# Patient Record
Sex: Male | Born: 1950 | Race: White | Hispanic: No | Marital: Married | Smoking: Never smoker
Health system: Southern US, Community
[De-identification: ages and names within clinical notes are randomized; demographics above are authoritative.]

## PROBLEM LIST (undated history)

## (undated) DIAGNOSIS — G2111 Neuroleptic induced parkinsonism: Secondary | ICD-10-CM

## (undated) DIAGNOSIS — I219 Acute myocardial infarction, unspecified: Secondary | ICD-10-CM

## (undated) DIAGNOSIS — E785 Hyperlipidemia, unspecified: Secondary | ICD-10-CM

## (undated) DIAGNOSIS — K648 Other hemorrhoids: Secondary | ICD-10-CM

## (undated) DIAGNOSIS — F419 Anxiety disorder, unspecified: Secondary | ICD-10-CM

## (undated) DIAGNOSIS — G478 Other sleep disorders: Secondary | ICD-10-CM

## (undated) DIAGNOSIS — K589 Irritable bowel syndrome without diarrhea: Secondary | ICD-10-CM

## (undated) DIAGNOSIS — I10 Essential (primary) hypertension: Secondary | ICD-10-CM

## (undated) DIAGNOSIS — C649 Malignant neoplasm of unspecified kidney, except renal pelvis: Secondary | ICD-10-CM

## (undated) DIAGNOSIS — I2510 Atherosclerotic heart disease of native coronary artery without angina pectoris: Secondary | ICD-10-CM

## (undated) DIAGNOSIS — Z973 Presence of spectacles and contact lenses: Secondary | ICD-10-CM

## (undated) DIAGNOSIS — G4750 Parasomnia, unspecified: Secondary | ICD-10-CM

## (undated) DIAGNOSIS — M1990 Unspecified osteoarthritis, unspecified site: Secondary | ICD-10-CM

## (undated) DIAGNOSIS — F32A Depression, unspecified: Secondary | ICD-10-CM

## (undated) DIAGNOSIS — M25511 Pain in right shoulder: Secondary | ICD-10-CM

## (undated) DIAGNOSIS — J45909 Unspecified asthma, uncomplicated: Secondary | ICD-10-CM

## (undated) DIAGNOSIS — F329 Major depressive disorder, single episode, unspecified: Secondary | ICD-10-CM

## (undated) DIAGNOSIS — M4322 Fusion of spine, cervical region: Secondary | ICD-10-CM

## (undated) DIAGNOSIS — G4700 Insomnia, unspecified: Secondary | ICD-10-CM

## (undated) DIAGNOSIS — R0683 Snoring: Secondary | ICD-10-CM

## (undated) DIAGNOSIS — Z955 Presence of coronary angioplasty implant and graft: Secondary | ICD-10-CM

## (undated) DIAGNOSIS — Z8719 Personal history of other diseases of the digestive system: Secondary | ICD-10-CM

## (undated) DIAGNOSIS — Z8781 Personal history of (healed) traumatic fracture: Secondary | ICD-10-CM

## (undated) DIAGNOSIS — T43505A Adverse effect of unspecified antipsychotics and neuroleptics, initial encounter: Secondary | ICD-10-CM

## (undated) DIAGNOSIS — G629 Polyneuropathy, unspecified: Secondary | ICD-10-CM

## (undated) DIAGNOSIS — G219 Secondary parkinsonism, unspecified: Secondary | ICD-10-CM

## (undated) DIAGNOSIS — G4730 Sleep apnea, unspecified: Secondary | ICD-10-CM

## (undated) DIAGNOSIS — R5383 Other fatigue: Secondary | ICD-10-CM

## (undated) DIAGNOSIS — E039 Hypothyroidism, unspecified: Secondary | ICD-10-CM

## (undated) DIAGNOSIS — K219 Gastro-esophageal reflux disease without esophagitis: Secondary | ICD-10-CM

## (undated) DIAGNOSIS — K5790 Diverticulosis of intestine, part unspecified, without perforation or abscess without bleeding: Secondary | ICD-10-CM

## (undated) HISTORY — DX: Parasomnia, unspecified: G47.50

## (undated) HISTORY — DX: Other sleep disorders: G47.8

## (undated) HISTORY — DX: Polyneuropathy, unspecified: G62.9

## (undated) HISTORY — DX: Presence of coronary angioplasty implant and graft: Z95.5

## (undated) HISTORY — DX: Hyperlipidemia, unspecified: E78.5

## (undated) HISTORY — DX: Malignant neoplasm of unspecified kidney, except renal pelvis: C64.9

## (undated) HISTORY — DX: Fusion of spine, cervical region: M43.22

## (undated) HISTORY — DX: Atherosclerotic heart disease of native coronary artery without angina pectoris: I25.10

## (undated) HISTORY — DX: Personal history of (healed) traumatic fracture: Z87.81

## (undated) HISTORY — DX: Anxiety disorder, unspecified: F41.9

## (undated) HISTORY — PX: CORONARY STENT PLACEMENT: SHX1402

## (undated) HISTORY — PX: HERNIA REPAIR: SHX51

## (undated) HISTORY — PX: COLONOSCOPY: SHX174

## (undated) HISTORY — PX: COLONOSCOPY W/ BIOPSIES AND POLYPECTOMY: SHX1376

## (undated) HISTORY — DX: Unspecified asthma, uncomplicated: J45.909

## (undated) HISTORY — DX: Unspecified osteoarthritis, unspecified site: M19.90

## (undated) HISTORY — DX: Other hemorrhoids: K64.8

## (undated) HISTORY — DX: Insomnia, unspecified: G47.00

## (undated) HISTORY — DX: Essential (primary) hypertension: I10

## (undated) HISTORY — DX: Major depressive disorder, single episode, unspecified: F32.9

## (undated) HISTORY — DX: Irritable bowel syndrome without diarrhea: K58.9

## (undated) HISTORY — PX: BACK SURGERY: SHX140

## (undated) HISTORY — DX: Snoring: R06.83

## (undated) HISTORY — DX: Depression, unspecified: F32.A

## (undated) HISTORY — DX: Other fatigue: R53.83

## (undated) HISTORY — DX: Acute myocardial infarction, unspecified: I21.9

## (undated) HISTORY — DX: Pain in right shoulder: M25.511

## (undated) HISTORY — DX: Personal history of other diseases of the digestive system: Z87.19

## (undated) HISTORY — DX: Gastro-esophageal reflux disease without esophagitis: K21.9

## (undated) HISTORY — DX: Diverticulosis of intestine, part unspecified, without perforation or abscess without bleeding: K57.90

---
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
## 1993-09-19 HISTORY — PX: FEMUR FRACTURE SURGERY: SHX633

## 1995-09-20 HISTORY — PX: NEPHRECTOMY: SHX65

## 1998-09-19 DIAGNOSIS — Z955 Presence of coronary angioplasty implant and graft: Secondary | ICD-10-CM

## 1998-09-19 HISTORY — PX: CARPAL TUNNEL RELEASE: SHX101

## 1998-09-19 HISTORY — DX: Presence of coronary angioplasty implant and graft: Z95.5

## 1998-11-06 ENCOUNTER — Ambulatory Visit (HOSPITAL_COMMUNITY): Admission: RE | Admit: 1998-11-06 | Discharge: 1998-11-06 | Payer: Self-pay | Admitting: *Deleted

## 1998-11-30 ENCOUNTER — Ambulatory Visit (HOSPITAL_COMMUNITY): Admission: RE | Admit: 1998-11-30 | Discharge: 1998-11-30 | Payer: Self-pay | Admitting: *Deleted

## 1999-11-23 ENCOUNTER — Encounter (INDEPENDENT_AMBULATORY_CARE_PROVIDER_SITE_OTHER): Payer: Self-pay | Admitting: Specialist

## 1999-11-23 ENCOUNTER — Ambulatory Visit (HOSPITAL_COMMUNITY): Admission: RE | Admit: 1999-11-23 | Discharge: 1999-11-23 | Payer: Self-pay | Admitting: Internal Medicine

## 1999-12-23 ENCOUNTER — Encounter: Admission: RE | Admit: 1999-12-23 | Discharge: 1999-12-23 | Payer: Self-pay | Admitting: Urology

## 1999-12-23 ENCOUNTER — Encounter: Payer: Self-pay | Admitting: Urology

## 2000-10-30 ENCOUNTER — Ambulatory Visit (HOSPITAL_COMMUNITY): Admission: RE | Admit: 2000-10-30 | Discharge: 2000-10-30 | Payer: Self-pay | Admitting: Internal Medicine

## 2000-10-30 ENCOUNTER — Encounter: Payer: Self-pay | Admitting: Internal Medicine

## 2001-04-03 ENCOUNTER — Encounter: Payer: Self-pay | Admitting: Internal Medicine

## 2001-04-03 ENCOUNTER — Encounter: Admission: RE | Admit: 2001-04-03 | Discharge: 2001-04-03 | Payer: Self-pay | Admitting: Internal Medicine

## 2001-12-11 ENCOUNTER — Encounter: Payer: Self-pay | Admitting: Emergency Medicine

## 2001-12-11 ENCOUNTER — Emergency Department (HOSPITAL_COMMUNITY): Admission: EM | Admit: 2001-12-11 | Discharge: 2001-12-11 | Payer: Self-pay | Admitting: Emergency Medicine

## 2001-12-17 ENCOUNTER — Encounter: Admission: RE | Admit: 2001-12-17 | Discharge: 2002-03-17 | Payer: Self-pay | Admitting: Internal Medicine

## 2002-03-14 ENCOUNTER — Encounter: Payer: Self-pay | Admitting: Emergency Medicine

## 2002-03-14 ENCOUNTER — Emergency Department (HOSPITAL_COMMUNITY): Admission: EM | Admit: 2002-03-14 | Discharge: 2002-03-14 | Payer: Self-pay | Admitting: Emergency Medicine

## 2002-03-20 ENCOUNTER — Encounter: Payer: Self-pay | Admitting: Internal Medicine

## 2002-03-20 ENCOUNTER — Ambulatory Visit (HOSPITAL_COMMUNITY): Admission: RE | Admit: 2002-03-20 | Discharge: 2002-03-20 | Payer: Self-pay | Admitting: Internal Medicine

## 2002-03-29 ENCOUNTER — Encounter: Payer: Self-pay | Admitting: Internal Medicine

## 2002-03-29 ENCOUNTER — Inpatient Hospital Stay (HOSPITAL_COMMUNITY): Admission: EM | Admit: 2002-03-29 | Discharge: 2002-03-31 | Payer: Self-pay | Admitting: Emergency Medicine

## 2002-03-30 ENCOUNTER — Encounter: Payer: Self-pay | Admitting: Internal Medicine

## 2002-04-11 ENCOUNTER — Encounter: Payer: Self-pay | Admitting: Internal Medicine

## 2002-04-11 ENCOUNTER — Ambulatory Visit (HOSPITAL_COMMUNITY): Admission: RE | Admit: 2002-04-11 | Discharge: 2002-04-11 | Payer: Self-pay | Admitting: Internal Medicine

## 2002-04-22 ENCOUNTER — Encounter: Payer: Self-pay | Admitting: Internal Medicine

## 2002-04-22 ENCOUNTER — Ambulatory Visit (HOSPITAL_COMMUNITY): Admission: RE | Admit: 2002-04-22 | Discharge: 2002-04-22 | Payer: Self-pay | Admitting: Internal Medicine

## 2003-09-20 HISTORY — PX: NECK SURGERY: SHX720

## 2003-09-20 HISTORY — PX: TOTAL KNEE ARTHROPLASTY: SHX125

## 2003-10-22 ENCOUNTER — Encounter: Payer: Self-pay | Admitting: Internal Medicine

## 2003-10-24 ENCOUNTER — Emergency Department (HOSPITAL_COMMUNITY): Admission: EM | Admit: 2003-10-24 | Discharge: 2003-10-24 | Payer: Self-pay | Admitting: Emergency Medicine

## 2005-05-12 ENCOUNTER — Encounter: Admission: RE | Admit: 2005-05-12 | Discharge: 2005-05-12 | Payer: Self-pay | Admitting: Allergy and Immunology

## 2005-10-12 ENCOUNTER — Encounter: Admission: RE | Admit: 2005-10-12 | Discharge: 2005-10-12 | Payer: Self-pay | Admitting: Neurosurgery

## 2005-11-02 ENCOUNTER — Ambulatory Visit (HOSPITAL_COMMUNITY): Admission: RE | Admit: 2005-11-02 | Discharge: 2005-11-03 | Payer: Self-pay | Admitting: Neurosurgery

## 2006-12-05 ENCOUNTER — Encounter: Admission: RE | Admit: 2006-12-05 | Discharge: 2006-12-05 | Payer: Self-pay | Admitting: Allergy and Immunology

## 2006-12-07 ENCOUNTER — Encounter: Payer: Self-pay | Admitting: Pulmonary Disease

## 2006-12-08 ENCOUNTER — Encounter: Admission: RE | Admit: 2006-12-08 | Discharge: 2006-12-08 | Payer: Self-pay | Admitting: Internal Medicine

## 2007-05-10 ENCOUNTER — Inpatient Hospital Stay (HOSPITAL_COMMUNITY): Admission: EM | Admit: 2007-05-10 | Discharge: 2007-05-11 | Payer: Self-pay | Admitting: Emergency Medicine

## 2007-05-10 HISTORY — PX: CARDIAC CATHETERIZATION: SHX172

## 2007-05-24 ENCOUNTER — Encounter: Admission: RE | Admit: 2007-05-24 | Discharge: 2007-05-24 | Payer: Self-pay | Admitting: Internal Medicine

## 2007-06-21 ENCOUNTER — Ambulatory Visit: Payer: Self-pay | Admitting: Internal Medicine

## 2007-06-25 ENCOUNTER — Ambulatory Visit: Payer: Self-pay | Admitting: Internal Medicine

## 2007-06-25 ENCOUNTER — Encounter: Payer: Self-pay | Admitting: Internal Medicine

## 2007-09-20 DIAGNOSIS — I219 Acute myocardial infarction, unspecified: Secondary | ICD-10-CM

## 2007-09-20 HISTORY — DX: Acute myocardial infarction, unspecified: I21.9

## 2007-11-16 ENCOUNTER — Encounter: Payer: Self-pay | Admitting: Pulmonary Disease

## 2007-11-27 DIAGNOSIS — J4540 Moderate persistent asthma, uncomplicated: Secondary | ICD-10-CM | POA: Insufficient documentation

## 2007-11-27 DIAGNOSIS — K219 Gastro-esophageal reflux disease without esophagitis: Secondary | ICD-10-CM | POA: Insufficient documentation

## 2007-11-27 DIAGNOSIS — K209 Esophagitis, unspecified without bleeding: Secondary | ICD-10-CM | POA: Insufficient documentation

## 2007-11-27 DIAGNOSIS — K648 Other hemorrhoids: Secondary | ICD-10-CM | POA: Insufficient documentation

## 2007-11-27 DIAGNOSIS — N400 Enlarged prostate without lower urinary tract symptoms: Secondary | ICD-10-CM | POA: Insufficient documentation

## 2007-11-27 DIAGNOSIS — I2510 Atherosclerotic heart disease of native coronary artery without angina pectoris: Secondary | ICD-10-CM | POA: Insufficient documentation

## 2007-11-27 DIAGNOSIS — Z87898 Personal history of other specified conditions: Secondary | ICD-10-CM | POA: Insufficient documentation

## 2007-11-27 DIAGNOSIS — K5730 Diverticulosis of large intestine without perforation or abscess without bleeding: Secondary | ICD-10-CM | POA: Insufficient documentation

## 2007-11-27 DIAGNOSIS — S72009A Fracture of unspecified part of neck of unspecified femur, initial encounter for closed fracture: Secondary | ICD-10-CM | POA: Insufficient documentation

## 2007-11-27 DIAGNOSIS — K2900 Acute gastritis without bleeding: Secondary | ICD-10-CM | POA: Insufficient documentation

## 2007-11-27 DIAGNOSIS — E782 Mixed hyperlipidemia: Secondary | ICD-10-CM | POA: Insufficient documentation

## 2007-11-27 DIAGNOSIS — IMO0002 Reserved for concepts with insufficient information to code with codable children: Secondary | ICD-10-CM | POA: Insufficient documentation

## 2007-11-27 DIAGNOSIS — K222 Esophageal obstruction: Secondary | ICD-10-CM | POA: Insufficient documentation

## 2007-11-27 DIAGNOSIS — R1012 Left upper quadrant pain: Secondary | ICD-10-CM | POA: Insufficient documentation

## 2007-11-27 DIAGNOSIS — C649 Malignant neoplasm of unspecified kidney, except renal pelvis: Secondary | ICD-10-CM | POA: Insufficient documentation

## 2007-11-27 DIAGNOSIS — E785 Hyperlipidemia, unspecified: Secondary | ICD-10-CM | POA: Insufficient documentation

## 2007-12-07 ENCOUNTER — Ambulatory Visit: Payer: Self-pay | Admitting: Pulmonary Disease

## 2007-12-20 DIAGNOSIS — J984 Other disorders of lung: Secondary | ICD-10-CM | POA: Insufficient documentation

## 2008-01-16 DIAGNOSIS — J479 Bronchiectasis, uncomplicated: Secondary | ICD-10-CM | POA: Insufficient documentation

## 2008-01-28 ENCOUNTER — Ambulatory Visit: Payer: Self-pay | Admitting: Pulmonary Disease

## 2008-04-08 ENCOUNTER — Inpatient Hospital Stay (HOSPITAL_COMMUNITY): Admission: EM | Admit: 2008-04-08 | Discharge: 2008-04-11 | Payer: Self-pay | Admitting: Cardiology

## 2008-04-08 ENCOUNTER — Encounter: Payer: Self-pay | Admitting: Emergency Medicine

## 2008-04-08 HISTORY — PX: CARDIAC CATHETERIZATION: SHX172

## 2008-04-13 ENCOUNTER — Inpatient Hospital Stay (HOSPITAL_COMMUNITY): Admission: EM | Admit: 2008-04-13 | Discharge: 2008-04-15 | Payer: Self-pay | Admitting: Emergency Medicine

## 2008-04-13 ENCOUNTER — Ambulatory Visit: Payer: Self-pay | Admitting: Infectious Diseases

## 2008-04-15 ENCOUNTER — Encounter: Payer: Self-pay | Admitting: Internal Medicine

## 2008-04-16 ENCOUNTER — Ambulatory Visit: Payer: Self-pay | Admitting: Internal Medicine

## 2008-04-17 ENCOUNTER — Encounter: Payer: Self-pay | Admitting: Internal Medicine

## 2008-04-24 ENCOUNTER — Encounter (HOSPITAL_COMMUNITY): Admission: RE | Admit: 2008-04-24 | Discharge: 2008-05-21 | Payer: Self-pay | Admitting: Cardiology

## 2008-05-30 ENCOUNTER — Encounter: Payer: Self-pay | Admitting: Internal Medicine

## 2009-05-07 ENCOUNTER — Ambulatory Visit (HOSPITAL_COMMUNITY): Admission: RE | Admit: 2009-05-07 | Discharge: 2009-05-08 | Payer: Self-pay | Admitting: Cardiology

## 2009-05-07 HISTORY — PX: CARDIAC CATHETERIZATION: SHX172

## 2009-11-11 ENCOUNTER — Encounter (INDEPENDENT_AMBULATORY_CARE_PROVIDER_SITE_OTHER): Payer: Self-pay | Admitting: *Deleted

## 2010-03-17 ENCOUNTER — Inpatient Hospital Stay (HOSPITAL_COMMUNITY): Admission: EM | Admit: 2010-03-17 | Discharge: 2010-03-19 | Payer: Self-pay | Admitting: Emergency Medicine

## 2010-03-18 HISTORY — PX: CARDIAC CATHETERIZATION: SHX172

## 2010-03-29 ENCOUNTER — Encounter: Payer: Self-pay | Admitting: Internal Medicine

## 2010-08-08 ENCOUNTER — Encounter: Admission: RE | Admit: 2010-08-08 | Discharge: 2010-08-08 | Payer: Self-pay | Admitting: Neurosurgery

## 2010-09-08 ENCOUNTER — Encounter
Admission: RE | Admit: 2010-09-08 | Discharge: 2010-09-08 | Payer: Self-pay | Source: Home / Self Care | Attending: Neurosurgery | Admitting: Neurosurgery

## 2010-09-23 ENCOUNTER — Encounter
Admission: RE | Admit: 2010-09-23 | Discharge: 2010-09-23 | Payer: Self-pay | Source: Home / Self Care | Attending: Neurosurgery | Admitting: Neurosurgery

## 2010-10-21 NOTE — Assessment & Plan Note (Signed)
Summary: f/u ct chest results.   Referred by:  Colonel Bald PCP:  Jacky Kindle  Chief Complaint:  OV to discuss CT results.  History of Present Illness: the patient comes in today for discussion of his recent CT chest.  I was able to get his disk from [facility] shortly after his initial consultation, and this revealed right middle and right lower lobe bronchiectasis which was mild in nature.  There were also some persistent nodularity changes in those areas, but much improved from the scan from one year ago.  He was also noted to have a left reload the density that was unchanged from a year ago as well, and was most consistent with old granulomatous disease.  Further support of this was seen with the liver and spleen having calcifications as well.  The patient currently is doing fairly well.     Current Allergies: ! PENICILLIN ! CODEINE ! SULFA ! IODINE ! * SHELLFISH      Vital Signs:  Patient Profile:   60 Years Old Male Height:     68 inches Weight:      204.50 pounds O2 Sat:      97 % O2 treatment:    Room Air Temp:     97.7 degrees F oral Pulse rate:   80 / minute BP sitting:   142 / 82  (left arm) Cuff size:   regular  Vitals Entered By: Cyndia Diver LPN (Jan 28, 2008 9:00 AM)             Comments Medications reviewed with patient  Cyndia Diver LPN  Jan 28, 2008 9:00 AM      Physical Exam  General:     well-developed male in no acute distress     Impression & Recommendations:  Problem # 1:  BRONCHIECTASIS WITHOUT ACUTE EXACERBATION (ICD-494.0) the patient has mild right middle lobe and right lower lobe bronchiectasis.  I suspect the prior nodular changes were due to colonization with atypical Mycobacteria.  His current CT is much improved from last year.  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  The presence of calcifications in the liver and spleen may support  this theory.  I had a long discussion with him about bronchiectasis, and discussed the idea of colonization versus true infection.  The patient has voiced understanding.  Given his history of renal cell carcinoma, I do think he needs a follow-up CT scan in one year for completeness.  The patient will contact our office in February, so that a CT can be set up in March.   Patient Instructions: 1)  please call in February of 2010 to set up CT chest in March of 2010. 2)  Follow-up p.r.n.   ]

## 2010-10-21 NOTE — Procedures (Signed)
Summary: Endoscopy   EGD  Procedure date:  04/15/2008  Findings:      Location: [facility]    Patient Name: Jared [name], Jared [name] MRN:  Procedure Procedures: Panendoscopy (EGD) CPT: 43235.    with biopsy(s)/brushing(s). CPT: D1846139.  Personnel: Endoscopist: Dora L. Juanda Chance, MD.  Exam Location: Exam performed in Endoscopy Suite.  Patient Consent: Procedure, Alternatives, Risks and Benefits discussed, consent obtained, from patient. Consent was obtained by the RN.  Indications Symptoms: Dysphagia. Chest Pain. Reflux symptoms  Surveillance of: Last exam: Oct, 2008.  History  Current Medications: Patient is taking a non-steroidal medication. Patient is not currently taking Coumadin.  Pre-Exam Physical: Performed Apr 15, 2008  Cardio-pulmonary exam, HEENT exam, Abdominal exam, Extremity exam, Neurological exam, Mental status exam WNL.  Comments: Pt. history reviewed/updated, physical exam performed prior to initiation of sedation?yes Exam Exam Info: Maximum depth of insertion Duodenum, intended Duodenum. Vocal cords visualized. Gastric retroflexion performed. Images taken. ASA Classification: II. Tolerance: good.  Sedation Meds: Patient assessed and found to be appropriate for moderate (conscious) sedation. Fentanyl 75 mcg. given IV. Versed 10 mg. given IV. Cetacaine Spray 2 sprays given aerosolized.  Monitoring: BP and pulse monitoring done. Oximetry used. Supplemental O2 given  Fluoroscopy: Fluoroscopy was not used.  Findings - ESOPHAGEAL INFLAMMATION: suspected as a result of reflux. Severity is moderate, erosions present.  Length of inflammation: 1 cm. [place] Classification: Grade A. Biopsy/Esoph Inflamtn taken. ICD9: Esophagitis, Reflux: 530.11.  - STRICTURE / STENOSIS: Stricture in Distal Esophagus.  Constriction: partial. 38 cm from mouth. Lumen diameter is 15 mm. Biopsy of Stricture/Steno  taken. ICD9: Esophageal Stricture: 530.3.  - Dilation:  Distal Esophagus. Savary dilator used, Diameter: 16,17 mm, Moderate Resistance, Minimal Heme present on extraction. 2  total dilators used. Patient tolerance fair. Outcome: successful.  - Normal: Duodenal Bulb to Duodenal Apex.  Normal: Body.   Assessment Abnormal examination, see findings above.  Diagnoses: 530.3: Esophageal Stricture.  530.11: Esophagitis, Reflux.   Comments: reflux esophagitis and a recurrent es, stricture, s/o dilation to 42F Events  Unplanned Intervention: No unplanned interventions were required.  Unplanned Events: There were no complications. Plans Medication(s): PPI: Pantoprazole/Protonix 40 mg QD, starting Apr 15, 2008   Comments: antoreflux measures Disposition: After procedure patient sent to recovery.    cc: Julieanne Manson, MD     Richard A. Jacky Kindle, MD  This report was created from the original endoscopy report, which was reviewed and signed by the above listed endoscopist.

## 2010-10-21 NOTE — Procedures (Signed)
Summary: Gastroenterology EGD  Gastroenterology EGD   Imported By: Christie Nottingham 11/28/2007 08:34:52  _____________________________________________________________________  External Attachment:    Type:   Image     Comment:   External Document

## 2010-10-21 NOTE — Assessment & Plan Note (Signed)
Summary: consult for abnormal ct chest.   Referred by:  Colonel Bald PCP:  Jacky Kindle  Chief Complaint:  Pulmonary Consult.  History of Present Illness: the patient is a 60 year old gentleman with a history of significant asthma, who is being referred for an abnormal chest CT.  The patient states that in February of this year, he had an acute exacerbation with fever, purulence, and significant wheezing.  He was treated with antibiotics and prednisone and had a significant improvement.  He had a CT scan of his chest done in February at [facility] which showed right lower lobe bronchiectasis with superimposed nodularity, as well as a 5-mm nodule in the left lower lobe.  There are also scattered calcifications in the spleen and liver.  The patient states that currently he is doing very well.  He feels that he is back to his usual baseline, and has had no worsening shortness of breath.  He has not had any recent sinus infections, but has had significant allergy issues ongoing.  He has no history of TB exposure, and has had a negative PPD in the past.  He was in the [employer] in [place], [place].  He denies any history of living in the [place].     Current Allergies: ! PENICILLIN ! CODEINE ! SULFA ! IODINE ! * SHELLFISH  Past Medical History:    Current Problems:     BENIGN PROSTATIC HYPERTROPHY, HX OF (ICD-V13.8)    DYSLIPIDEMIA (ICD-272.4)    ASTHMA (ICD-493.90)    CORONARY ARTERY DISEASE (ICD-414.00)    ABDOMINAL PAIN, LEFT UPPER QUADRANT (ICD-789.02)    FRACTURE, TIBIA (ICD-823.40)    HIP FRACTURE, LEFT (ICD-820.8)    HYPERNEPHROMA (ICD-189.0)    GERD (ICD-530.81)    ESOPHAGEAL STRICTURE (ICD-530.3)    HEMORRHOIDS, INTERNAL (ICD-455.0)    GASTRITIS, ACUTE (ICD-535.00)    ESOPHAGITIS (ICD-530.10)    DIVERTICULOSIS, COLON (ICD-562.10)     history of renal cell carcinoma with nephrectomy in 1997  Past Surgical History:    multiple orthopedic procedures  multiple spine surgeries.   Family History:    asthma: mother    heart disease: paternal grandfather    cancer: father (esophageal)  Social History:    Patient never smoked.     pt is married.    pt is a horticulturist.    Risk Factors:  Tobacco use:  never   Review of Systems      See HPI   Vital Signs:  Patient Profile:   60 Years Old Male Height:     68 inches Weight:      206 pounds O2 Sat:      96 % O2 treatment:    Room Air Temp:     97.6 degrees F oral Pulse rate:   98 / minute BP sitting:   116 / 78  (left arm) Cuff size:   regular  Vitals Entered By: Cyndia Diver LPN (December 07, 2007 3:42 PM)             Is Patient Diabetic? No Comments Medications reviewed with patient  ..................................................................Marland KitchenCyndia Diver LPN  December 07, 2007 3:44 PM      Physical Exam  General:     well-developed male in no acute distress Eyes:     PERRLA and EOMI.   Nose:     mild erythema of the nasal mucosa, deviated septum to the left Mouth:     clear Neck:     no JVD, thyromegaly, or lymphadenopathy.  Lungs:     totally clear to auscultation Heart:     regular rate and rhythm, no MRG Abdomen:     soft and nontender, bowel sounds present Extremities:     no edema, pulses intact distally Neurologic:     alert and oriented, moves all 4 extremities.     Impression & Recommendations:  Problem # 1:  BRONCHIECTASIS WITHOUT ACUTE EXACERBATION (ICD-494.0) I have finally received the patient's disk from [facility], and it shows definite bronchiectasis with nodularity involving the right lower lobe and some in the right middle lobe.  The appearance radiographically is suggestive of atypical mycobacterial colonization.  The patient is really having no significant symptoms that would indicate active ongoing infection.  His recent illness in February probably represents a bronchiectasis flare.  At this point, I would just see  how he does clinically.  If he begins to have a worsening chest x-ray along with increased constitutional and pulmonary symptoms, we may have to consider whether he has an active infection rather than columnization.  Problem # 2:  PULMONARY NODULE (ICD-518.89) the patient does have a 5-mm nodule in the left lower lobe which is unchanged from prior studies.  With his calcifications in the spleen and liver, as well as his location in the [employer], I wonder if he has old coccidiomycosis.  The patient also has a history of renal cell cancer, and therefore I would consider one more follow-up at one year  Medications Added to Medication List This Visit: 1)  Nexium 40 Mg Cpdr (Esomeprazole magnesium) .... Take 1 tablet by mouth once a day 2)  Albuterol 90 Mcg/act Aers (Albuterol) .... Inhale 2 puffs every 4 to 6 hours as needed 3)  Multivitamins Tabs (Multiple vitamin) .... Take 1 tablet by mouth once a day   Patient Instructions: 1)  will call you to discuss your CT scan once the disk is available.    ]  Appended Document: consult for abnormal ct chest. pt needs ov to discuss ct chest and plans  Appended Document: consult for abnormal ct chest. spoke with pt's wife and informed her pt needs ov to discuss ct results. wife stated she would give message to pt so he can call and schedule ov.    Appended Document: consult for abnormal ct chest. pt made appt with dr. Shelle Iron for friday may 8 @ 9:45am

## 2010-10-21 NOTE — Consult Note (Signed)
Summary: [facility]  [facility] [place]   Imported By: Maryln Gottron 01/22/2008 14:32:41  _____________________________________________________________________  External Attachment:    Type:   Image     Comment:   External Document

## 2010-10-21 NOTE — Letter (Signed)
Summary: [facility]  [facility]   Imported By: Lennie Odor 04/08/2010 10:27:04  _____________________________________________________________________  External Attachment:    Type:   Image     Comment:   External Document

## 2010-10-21 NOTE — Letter (Signed)
Summary: Patient Notice-Endo Biopsy Results  [facility]  [address] [place], [place] [address]   Phone: [phone]  Fax: [phone]        April 17, 2008 MRN: [id number]    Jared [name] [address] [place], [place]  [address]    Dear [name],  I am pleased to inform you that the biopsies taken during your recent endoscopic examination did not show any evidence of cancer upon pathologic examination.There is no evidence of Barrett's esophagus  Additional information/recommendations:     _x_ Continue with the treatment plan as outlined on the day of your      exam.    Please call us if you are having persistent problems or have questions about your condition that have not been fully answered at this time.  Sincerely,  Hart Carwin MD  This letter has been electronically signed by your physician.  Appended Document: Patient Notice-Endo Biopsy Results Letter mailed to patient.

## 2010-10-21 NOTE — Procedures (Signed)
Summary: Gastroenterology Colon  Gastroenterology Colon   Imported By: Christie Nottingham 11/28/2007 08:35:12  _____________________________________________________________________  External Attachment:    Type:   Image     Comment:   External Document

## 2010-10-21 NOTE — Letter (Signed)
Summary: Colonoscopy Date Change Letter  [facility]  [address] [place], [place] [address]   Phone: [phone]  Fax: [phone]      November 11, 2009 MRN: [id number]   Jared [name] [address] [place], [place]  [address]   Dear Jared [name],   Previously you were recommended to have a repeat colonoscopy around this time. Your chart was recently reviewed by Dr. Hedwig Morton. Juanda Chance of [facility]. Follow up colonoscopy is now recommended in February 2015. This revised recommendation is based on current, nationally recognized guidelines for colorectal cancer screening and polyp surveillance. These guidelines are endorsed by the American Cancer Society, [employer] on Colorectal Cancer as well as numerous other major medical organizations.  Please understand that our recommendation assumes that you do not have any new symptoms such as bleeding, a change in bowel habits, anemia, or significant abdominal discomfort. If you do have any concerning GI symptoms or want to discuss the guideline recommendations, please call to arrange an office visit at your earliest convenience. Otherwise we will keep you in our reminder system and contact you 1-2 months prior to the date listed above to schedule your next colonoscopy.  Thank you,  Hedwig Morton. Juanda Chance, M.D.  [facility] [phone]

## 2010-12-05 LAB — URINALYSIS, ROUTINE W REFLEX MICROSCOPIC
Bilirubin Urine: NEGATIVE
Glucose, UA: NEGATIVE mg/dL
Hgb urine dipstick: NEGATIVE
Ketones, ur: NEGATIVE mg/dL
Nitrite: NEGATIVE
Protein, ur: NEGATIVE mg/dL
Specific Gravity, Urine: 1.025 (ref 1.005–1.030)
Urobilinogen, UA: 0.2 mg/dL (ref 0.0–1.0)
pH: 7 (ref 5.0–8.0)

## 2010-12-05 LAB — D-DIMER, QUANTITATIVE
D-Dimer, Quant: 0.63 ug/mL-FEU — ABNORMAL HIGH (ref 0.00–0.48)
D-Dimer, Quant: 0.65 ug/mL-FEU — ABNORMAL HIGH (ref 0.00–0.48)

## 2010-12-05 LAB — TSH: TSH: 2.004 u[IU]/mL (ref 0.350–4.500)

## 2010-12-05 LAB — BASIC METABOLIC PANEL
BUN: 19 mg/dL (ref 6–23)
BUN: 22 mg/dL (ref 6–23)
BUN: 23 mg/dL (ref 6–23)
CO2: 19 mEq/L (ref 19–32)
CO2: 24 mEq/L (ref 19–32)
CO2: 27 mEq/L (ref 19–32)
Calcium: 8.4 mg/dL (ref 8.4–10.5)
Calcium: 8.8 mg/dL (ref 8.4–10.5)
Calcium: 9.1 mg/dL (ref 8.4–10.5)
Chloride: 109 mEq/L (ref 96–112)
Chloride: 104 mEq/L (ref 96–112)
Chloride: 105 mEq/L (ref 96–112)
Creatinine, Ser: 1.37 mg/dL (ref 0.4–1.5)
Creatinine, Ser: 1.31 mg/dL (ref 0.4–1.5)
Creatinine, Ser: 1.43 mg/dL (ref 0.4–1.5)
GFR calc Af Amer: >60 mL/min (ref >60)
GFR calc Af Amer: >60 mL/min (ref >60)
GFR calc Af Amer: >60 mL/min (ref >60)
GFR calc non Af Amer: 53 mL/min — ABNORMAL LOW (ref >60)
GFR calc non Af Amer: 50 mL/min — ABNORMAL LOW (ref >60)
GFR calc non Af Amer: 56 mL/min — ABNORMAL LOW (ref >60)
Glucose, Bld: 144 mg/dL — ABNORMAL HIGH (ref 70–99)
Glucose, Bld: 101 mg/dL — ABNORMAL HIGH (ref 70–99)
Glucose, Bld: 97 mg/dL (ref 70–99)
Potassium: 4.2 mEq/L (ref 3.5–5.1)
Potassium: 4 mEq/L (ref 3.5–5.1)
Potassium: 4.1 mEq/L (ref 3.5–5.1)
Sodium: 138 mEq/L (ref 135–145)
Sodium: 138 mEq/L (ref 135–145)
Sodium: 139 mEq/L (ref 135–145)

## 2010-12-05 LAB — CBC
HCT: 42.8 % (ref 39.0–52.0)
HCT: 42.8 % (ref 39.0–52.0)
HCT: 45.8 % (ref 39.0–52.0)
Hemoglobin: 14.3 g/dL (ref 13.0–17.0)
Hemoglobin: 14.4 g/dL (ref 13.0–17.0)
Hemoglobin: 15.3 g/dL (ref 13.0–17.0)
MCH: 30.7 pg (ref 26.0–34.0)
MCH: 30 pg (ref 26.0–34.0)
MCH: 30.3 pg (ref 26.0–34.0)
MCHC: 33.5 g/dL (ref 30.0–36.0)
MCHC: 33.4 g/dL (ref 30.0–36.0)
MCHC: 33.7 g/dL (ref 30.0–36.0)
MCV: 91.6 fL (ref 78.0–100.0)
MCV: 90 fL (ref 78.0–100.0)
MCV: 90 fL (ref 78.0–100.0)
Platelets: 227 10*3/uL (ref 150–400)
Platelets: 228 10*3/uL (ref 150–400)
Platelets: 237 10*3/uL (ref 150–400)
RBC: 4.67 MIL/uL (ref 4.22–5.81)
RBC: 4.76 MIL/uL (ref 4.22–5.81)
RBC: 5.09 MIL/uL (ref 4.22–5.81)
RDW: 13.9 % (ref 11.5–15.5)
RDW: 13.8 % (ref 11.5–15.5)
RDW: 13.8 % (ref 11.5–15.5)
WBC: 18.8 10*3/uL — ABNORMAL HIGH (ref 4.0–10.5)
WBC: 6.8 10*3/uL (ref 4.0–10.5)
WBC: 8.4 10*3/uL (ref 4.0–10.5)

## 2010-12-05 LAB — DIFFERENTIAL
Basophils Absolute: 0 10*3/uL (ref 0.0–0.1)
Basophils Relative: 1 % (ref 0–1)
Eosinophils Absolute: 0.4 10*3/uL (ref 0.0–0.7)
Eosinophils Relative: 6 % — ABNORMAL HIGH (ref 0–5)
Lymphocytes Relative: 20 % (ref 12–46)
Lymphs Abs: 1.4 10*3/uL (ref 0.7–4.0)
Monocytes Absolute: 0.7 10*3/uL (ref 0.1–1.0)
Monocytes Relative: 11 % (ref 3–12)
Neutro Abs: 4.2 10*3/uL (ref 1.7–7.7)
Neutrophils Relative %: 62 % (ref 43–77)

## 2010-12-05 LAB — CARDIAC PANEL(CRET KIN+CKTOT+MB+TROPI)
CK, MB: 3.3 ng/mL (ref 0.3–4.0)
CK, MB: 3.4 ng/mL (ref 0.3–4.0)
CK, MB: 3.8 ng/mL (ref 0.3–4.0)
Relative Index: 1.7 (ref 0.0–2.5)
Relative Index: 1.8 (ref 0.0–2.5)
Relative Index: 2.1 (ref 0.0–2.5)
Total CK: 165 U/L (ref 7–232)
Total CK: 184 U/L (ref 7–232)
Total CK: 221 U/L (ref 7–232)
Troponin I: 0.01 ng/mL (ref 0.00–0.06)
Troponin I: 0.01 ng/mL (ref 0.00–0.06)
Troponin I: 0.03 ng/mL (ref 0.00–0.06)

## 2010-12-05 LAB — HEPATIC FUNCTION PANEL
ALT: 80 U/L — ABNORMAL HIGH (ref 0–53)
AST: 38 U/L — ABNORMAL HIGH (ref 0–37)
Albumin: 3.7 g/dL (ref 3.5–5.2)
Alkaline Phosphatase: 64 U/L (ref 39–117)
Bilirubin, Direct: 0.1 mg/dL (ref 0.0–0.3)
Indirect Bilirubin: 0.6 mg/dL (ref 0.3–0.9)
Total Bilirubin: 0.7 mg/dL (ref 0.3–1.2)
Total Protein: 7 g/dL (ref 6.0–8.3)

## 2010-12-05 LAB — LIPID PANEL
Cholesterol: 141 mg/dL (ref 0–200)
HDL: 38 mg/dL — ABNORMAL LOW (ref >39)
LDL Cholesterol: UNDETERMINED mg/dL (ref 0–99)
Total CHOL/HDL Ratio: 3.7 RATIO
Triglycerides: 474 mg/dL — ABNORMAL HIGH (ref <150)
VLDL: UNDETERMINED mg/dL (ref 0–40)

## 2010-12-05 LAB — BRAIN NATRIURETIC PEPTIDE: Pro B Natriuretic peptide (BNP): <30 pg/mL (ref 0.0–100.0)

## 2010-12-05 LAB — MRSA PCR SCREENING: MRSA by PCR: NEGATIVE

## 2010-12-05 LAB — PROTIME-INR
INR: 0.96 (ref 0.00–1.49)
Prothrombin Time: 12.7 seconds (ref 11.6–15.2)

## 2010-12-05 LAB — CK TOTAL AND CKMB (NOT AT ARMC)
CK, MB: 5.8 ng/mL — ABNORMAL HIGH (ref 0.3–4.0)
Relative Index: 1.9 (ref 0.0–2.5)
Total CK: 301 U/L — ABNORMAL HIGH (ref 7–232)

## 2010-12-05 LAB — HEMOGLOBIN A1C
Hgb A1c MFr Bld: 5.9 % — ABNORMAL HIGH (ref <5.7)
Mean Plasma Glucose: 123 mg/dL — ABNORMAL HIGH (ref <117)

## 2010-12-05 LAB — POCT CARDIAC MARKERS
CKMB, poc: 3.2 ng/mL (ref 1.0–8.0)
Myoglobin, poc: 162 ng/mL (ref 12–200)
Troponin i, poc: <0.05 ng/mL (ref 0.00–0.09)

## 2010-12-05 LAB — MAGNESIUM: Magnesium: 2.1 mg/dL (ref 1.5–2.5)

## 2010-12-05 LAB — APTT: aPTT: 25 seconds (ref 24–37)

## 2010-12-05 LAB — HEPARIN LEVEL (UNFRACTIONATED)
Heparin Unfractionated: 0.23 IU/mL — ABNORMAL LOW (ref 0.30–0.70)
Heparin Unfractionated: 0.31 IU/mL (ref 0.30–0.70)

## 2010-12-05 LAB — TROPONIN I: Troponin I: 0.01 ng/mL (ref 0.00–0.06)

## 2010-12-25 LAB — CBC
HCT: 41.6 % (ref 39.0–52.0)
HCT: 47.4 % (ref 39.0–52.0)
Hemoglobin: 14.2 g/dL (ref 13.0–17.0)
Hemoglobin: 16.1 g/dL (ref 13.0–17.0)
MCHC: 34 g/dL (ref 30.0–36.0)
MCHC: 34.1 g/dL (ref 30.0–36.0)
MCV: 87.8 fL (ref 78.0–100.0)
MCV: 88.9 fL (ref 78.0–100.0)
Platelets: 244 10*3/uL (ref 150–400)
Platelets: 269 10*3/uL (ref 150–400)
RBC: 4.68 MIL/uL (ref 4.22–5.81)
RBC: 5.4 MIL/uL (ref 4.22–5.81)
RDW: 14.2 % (ref 11.5–15.5)
RDW: 14.4 % (ref 11.5–15.5)
WBC: 16.7 10*3/uL — ABNORMAL HIGH (ref 4.0–10.5)
WBC: 6.8 10*3/uL (ref 4.0–10.5)

## 2010-12-25 LAB — PROTIME-INR
INR: 0.9 (ref 0.00–1.49)
Prothrombin Time: 11.5 seconds — ABNORMAL LOW (ref 11.6–15.2)

## 2010-12-25 LAB — BASIC METABOLIC PANEL
BUN: 25 mg/dL — ABNORMAL HIGH (ref 6–23)
BUN: 28 mg/dL — ABNORMAL HIGH (ref 6–23)
CO2: 21 mEq/L (ref 19–32)
CO2: 25 mEq/L (ref 19–32)
Calcium: 8.3 mg/dL — ABNORMAL LOW (ref 8.4–10.5)
Calcium: 8.7 mg/dL (ref 8.4–10.5)
Chloride: 104 mEq/L (ref 96–112)
Chloride: 110 mEq/L (ref 96–112)
Creatinine, Ser: 1.37 mg/dL (ref 0.4–1.5)
Creatinine, Ser: 1.41 mg/dL (ref 0.4–1.5)
GFR calc Af Amer: >60 mL/min (ref >60)
GFR calc Af Amer: >60 mL/min (ref >60)
GFR calc non Af Amer: 51 mL/min — ABNORMAL LOW (ref >60)
GFR calc non Af Amer: 53 mL/min — ABNORMAL LOW (ref >60)
Glucose, Bld: 142 mg/dL — ABNORMAL HIGH (ref 70–99)
Glucose, Bld: 95 mg/dL (ref 70–99)
Potassium: 4 mEq/L (ref 3.5–5.1)
Potassium: 4.1 mEq/L (ref 3.5–5.1)
Sodium: 137 mEq/L (ref 135–145)
Sodium: 140 mEq/L (ref 135–145)

## 2010-12-25 LAB — URINALYSIS, ROUTINE W REFLEX MICROSCOPIC
Bilirubin Urine: NEGATIVE
Glucose, UA: NEGATIVE mg/dL
Hgb urine dipstick: NEGATIVE
Ketones, ur: NEGATIVE mg/dL
Nitrite: NEGATIVE
Protein, ur: NEGATIVE mg/dL
Specific Gravity, Urine: 1.022 (ref 1.005–1.030)
Urobilinogen, UA: 1 mg/dL (ref 0.0–1.0)
pH: 6.5 (ref 5.0–8.0)

## 2010-12-25 LAB — LIPID PANEL
Cholesterol: 216 mg/dL — ABNORMAL HIGH (ref 0–200)
HDL: 35 mg/dL — ABNORMAL LOW (ref >39)
LDL Cholesterol: 123 mg/dL — ABNORMAL HIGH (ref 0–99)
Total CHOL/HDL Ratio: 6.2 RATIO
Triglycerides: 291 mg/dL — ABNORMAL HIGH (ref <150)
VLDL: 58 mg/dL — ABNORMAL HIGH (ref 0–40)

## 2010-12-25 LAB — TSH: TSH: 4.889 u[IU]/mL — ABNORMAL HIGH (ref 0.350–4.500)
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
## 2011-02-01 NOTE — Discharge Summary (Signed)
NAME:  Jared [name], Jared [name]             ACCOUNT NO.:  [id number]   MEDICAL RECORD NO.:  [id number]          PATIENT TYPE:  INP   LOCATION:  2030                         FACILITY:  [facility]   PHYSICIAN:  Thereasa Solo. Little, M.D. DATE OF BIRTH:  1950-10-04   DATE OF ADMISSION:  05/07/2009  DATE OF DISCHARGE:  05/08/2009                               DISCHARGE SUMMARY   DISCHARGE DIAGNOSES:  1. Chest pain, not cardiac in origin.      a.     If chest pain recurs, may need followup with Dr. [name].  2. Lung nodule on chest x-ray not present on CT of the chest.  3. Hypertension controlled.  4. History of asthma.  5. Hyperlipidemia, uncontrolled with LDL of 123.  6. 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  7. History of pneumonia last year.   DISCHARGE CONDITION:  Improved.   PROCEDURES:  Combined left heart cath by Dr. Julieanne Manson on May 07, 2009 with ejection fraction 50%, 1+ MR.   DISCHARGE MEDICATIONS:  See discharge medication list in computer  through [facility].   DISCHARGE INSTRUCTIONS:  1. No work until May 14, 2009.  When return to work; no lifting      over 10 pounds for 4 weeks and no exposure to extreme temperatures      for 4 weeks.  2. Increase activity slowly, may shower, and no lifting for 4 weeks      over 10 pounds.  3. No driving for 2 days.  No sexual activity for 2 days.  4. Low-sodium heart-healthy diet.  5. May walk beginning on May 11, 2009.  Please note, this is      exercise.  6. May begin indoor bike riding on May 14, 2009.  7. Wash cath site with soap and water.  Call if any bleeding,      swelling, or drainage.  8. Follow up with Dr. Clarene Duke on Wednesday, May 27, 2009, at 3:45      p.m.  9. Follow up with Dr. Jacky Kindle as instructed.   HOSPITAL COURSE:  The patient was admitted secondary to having chest  pain and being __________to be seen on May 06, 2009 with Dr. Clarene Duke  in the office.  He has a history of  inferior MI and a stent placed to  his RCA __________ drug-eluting stent in 2007.  He has had recurrent  pain at times with patency of that stent, but on this cath, he was  stable from a coronary standpoint, but he did have 50% left main  stenosis which was new, up from 20%.  Therefore, he will need aggressive  management.  Here in the hospital, his LDL was 123.  We have added Zetia  to his medical regimen, and it will be added as an outpatient.   We did do a CT of his chest, as on chest x-ray he had pulmonary nodule.  CT, no evidence of nodule at the left lung base.  There is some scarring  at that site and also right coronary stent was  seen.  No pathologically  enlarged lymph nodes, linear scarring in the right middle lobe,  calcified granuloma in the liver and spleen.  The patient ambulating,  was stable and was ready for discharge home.  No ACE was given secondary  to his history of asthma, and the fact that he has single kidney with  borderline BUN and creatinine.   We will address his cholesterol as an outpatient.      Darcella Gasman. [place]gold, N.P.    ______________________________  Thereasa Solo Little, M.D.    LRI/MEDQ  D:  05/08/2009  T:  05/09/2009  Job:  [id number]   cc:   Thereasa Solo. Little, M.D.  Geoffry Paradise, M.D.  Hedwig Morton. Juanda Chance, MD

## 2011-02-01 NOTE — Discharge Summary (Signed)
NAME:  MABRY, TIFT NO.:  [id number]   MEDICAL RECORD NO.:  [id number]          PATIENT TYPE:  INP   LOCATION:  6709                         FACILITY:  [facility]   PHYSICIAN:  Thereasa Solo. Little, M.D. DATE OF BIRTH:  17-Oct-1950   DATE OF ADMISSION:  05/10/2007  DATE OF DISCHARGE:  05/11/2007                               DISCHARGE SUMMARY   Mr. Soja is a 60 year old male patient who came into the emergency  room with chest pain symptoms.  He apparently felt it as a pressure  radiating to his left arm, down his hand, no shortness of breath.  He  did have diaphoresis.  No presyncope.  He had some slight dizziness with  standing.  He apparently called 9-1-1.  He was given oxygen, four baby  aspirin and sublingual nitroglycerin in the ER and the pressure was  relieved.  He was seen by Dr. Clarene Duke in the emergency room and it was  thought he should undergo cardiac catheterization with prior medical  history of hyperlipidemia and premature family history of heart disease.  Thus, cardiac catheterization was performed on May 10, 2007, which  revealed normal LV function.  He had left main terminal 20%, LAD to the  apex was okay.  He had a first diagonal moderate size which was okay.  RCA had mild 20-30% narrowing.  Large PDA had mid 40%.  Thus, he had  minimal coronary disease.  Because he had a single kidney, he had a  nephrectomy secondary to CA, it was decided to keep him overnight and  hydrate him.  On May 11, 2007, he was seen by Dr. Clarene Duke.  He was  considered stable for discharge home.  His blood pressure was 98/57,  pulse 60, respirations 20, O2 saturation 95%.   LABORATORIES:  Hemoglobin 14.6, hematocrit 43.8, wbc's 7.1, and  platelets 262.  His sodium was 143, potassium 4.2, BUN was 25,  creatinine 1.47.  His total cholesterol was 195, triglycerides 141, HDL  was 42, and LDL was 125.  TSH was 5.844, mildly high; normal range is  5.500.  Chest x-ray showed  mild hyperaeration and slight peribronchial  thickening.   DISCHARGE MEDICATIONS:  1. Crestor 20 mg a day.  2. Nexium 40 mg twice a day.  3. Singulair 10 mg a day.  4. Flomax 0.4 mg at bedtime.  5. Advair 500/50 twice a day.  6. Aspirin 81 mg a day.  7. Paxil 20 mg a day.  8. Claritin 10 mg a day.   He was told to stop his simvastatin.   DISCHARGE DIAGNOSES:  1. Mild coronary artery disease, nonobstructive.  2. Dyslipidemia.  3. Family history of coronary disease.  4. History of left nephrectomy for renal cell cancer.  5. Significant asthma, not recommended to use beta blockers.  6. Gastroesophageal reflux disease.  7. Benign prostatic hypertrophy.   He has a followup appointment with Dr. Clarene Duke.      Lezlie Octave, N.P.    ______________________________  Thereasa Solo. Little, M.D.    BB/MEDQ  D:  06/15/2007  T:  06/15/2007  Job:  [id number]   cc:   Geoffry Paradise, MD
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
## 2011-02-01 NOTE — Discharge Summary (Signed)
NAME:  Jared [name], Jared [name]             ACCOUNT NO.:  [id number]   MEDICAL RECORD NO.:  [id number]          PATIENT TYPE:  INP   LOCATION:  3730                         FACILITY:  [facility]   PHYSICIAN:  Cristy Hilts. Jacinto Halim, MD       DATE OF BIRTH:  [birth year]   DATE OF ADMISSION:  04/08/2008  DATE OF DISCHARGE:  04/11/2008                               DISCHARGE SUMMARY   DISCHARGE DIAGNOSES:  1. An inferior ST-elevation myocardial infarction though the ST      elevation did not meet criteria as only 1 mm or so elevation      inferiorly, but prolonged chest pain, unrelieved with medication.  2. History of nonobstructive coronary disease in the past.  3. 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      a.     Residual 30% distal left main disease.  4. Hyperlipidemia.  5. Left nephrectomy for renal cell cancer.  6. History of esophageal stricture with dilatation.  7. Appears to be boil arising on his left forearm, now treated with      ointment and antibiotics.  8. History of methicillin-resistant Staphylococcus aureus with left      knee replacement several years ago.  9. History of degenerative joint disease and back surgeries.  10.Benign prostatic hypertrophy.  11.History of asthma.   ALLERGIES:  Contrast allergy, IODINE.  There was a mild reaction to  SULFA, PENICILLIN, CODEINE, and severe muscle cramps with CRESTOR and  LIPITOR.   DISCHARGE MEDICATIONS:  1. Nexium 40 mg twice a day.  2. Plavix 75 mg 1 daily, take before the Nexium by 1-2 hours.  Do not      stop Plavix.  3. Aspirin enteric coated 325 mg daily, new dose.  4. Singulair 10 mg daily.  5. Advair 500/50 Diskus 1 puff twice a day.  6. Allegra as before.  7. Pravachol 80 mg every evening for cholesterol.  8. Flomax 0.4 mg as needed.  9. Atenolol 25 mg half a tab twice a day.  Call if  increased issues      with asthma.  10.Nitroglycerin 1/150 under your tongue if needed for chest pain      while sitting and 1 every 5 minutes up to a total of 3 every 15      minutes, then to ER or 911.  11.Paxil 20 mg daily.  12.Bactroban ointment applied twice to three times a day to boil area.  13.Zithromax pack take as directed.  14.Benadryl 25 mg 1 every 6 hours if needed for itching.  15.Have blood work done Monday at Dr. Clarene Duke or Dr. Lanell Matar office.   DISCHARGE INSTRUCTIONS:  1. Apply warm soaks to the left arm, warm wash cloth to area 3 times a      day, then apply the Bactroban.  2. Do not return to work until you see Dr. Clarene Duke.  3. Low-sodium heart-healthy diet.  4. Increase activity slowly.  No lifting for  2 weeks.  No driving for      1 week.  5. Wash cath site with soap and water.  Call if any bleeding,      swelling, or drainage.  6. Follow with Dr. Clarene Duke, April 15, 2008, at noon.   HISTORY OF PRESENT ILLNESS:  A 60 year old white married male with  history of nonobstructive coronary disease with cath in July 2008,  presented to [facility] Emergency Room on April 08, 2008, with  complaints of chest pain with left anterior pain not relieved with  nitro, heparin and continued though it was somewhat improved, but was  not relieved.  EKG, though overall it looks like he has early  repolarization changes, but with continued pain, there is a millimeter  of ST elevation in lead III more specifically.  At that point, he was on  heparin and nitro.  We added Integrilin when he was transferred to [facility].  STEMI was called and he had already received his aspirin.  Transferred emergently to the cath lab where he underwent cardiac  catheterization with his agreement and was found to have 70% ulcerated  stenosis of the RCA and underwent PTCA and stent by Dr. Jacinto Halim.  EF was  55-60% and some hypokinesis.   Initially, the patient was not on beta-blocker because he has  asthma and  only 1 kidney, so we held off on ACE inhibitors as well.  On his second  day postprocedure, we did add Altace and then his creatinine bumped to  1.68 which we discontinued the Altace.  So therefore, we are holding on  ACE inhibitors.  We do have him on a low dose of atenolol, and he knows  to call us if he has any asthma symptoms.  We will have him follow with  Dr. Clarene Duke next week.  During the hospitalization, he remained stable  and progressed to his myocardial infarction and nitro was weaned.  He  was able to ambulate after his Integrilin was discontinued, then he was  transferred to telemetry on April 10, 2008.  By April 11, 2008, he was  stable, ambulating without problems, and ready for discharge home.  Groin cath site was stable.  Vital signs at discharge, blood pressure  118/83, pulse 58, respiratory rate 20, temperature 97.7, and oxygen  saturation 97% on room air.  Lungs were clear.  Heart regular rate and  rhythm.  Groin site stable.   LABORATORY VALUES:  Hemoglobin initially 15.6, hematocrit 46.9, WBC 7.9,  and platelets 252.  White count bumped to 11.5, and at discharge, WBC  8.7, hemoglobin 14.4, hematocrit 43.6, and platelets 248.  Chemistry  stable, discharge sodium 139, potassium 4.4, chloride 106, CO2 28, BUN  24, creatinine 1.68, and glucose 89.  The 1.68 was slightly elevated,  previously the patient was admitted at 1.30 and a BUN of 30.   Coags, PT 12.5, INR 0.9, PTT 27, heparin less than 0.10.  AST was 46,  ALT 37, alkaline phos 54, total bili 0.7, lipase 15, and albumin 3.6.  Cardiac enzymes initially, CK 362, MB 49, and troponin 3.85.  These  gradually declined and CKs went to 285, 177, 170; MB 37, 20, and 19;  troponin I 3.30 and on April 09, 2008, he was 2.15 troponin.   Total cholesterol 229, LDL was 150, HDL 41, and triglycerides 161.  Magnesium was 2.2 and calcium 8.6.  TSH 1.602 and glycohemoglobin was 6.   Also original cardiac markers were  positive, and BNP was less than 30.   Chest x-ray, chronic scarring at the lung bases.  No active disease is  evident.  EKG is as stated, early repolarization changes initially,  which he still has.  He does have Q-wave in II, III, and aVF, but at one  point some increase of his ST elevation in lead III with continued chest  pain, it was declared as STEMI and transferred to [facility].   Please note that the patient has also had a strong family history of  coronary disease and premature history of coronary disease.  By April 11, 2008, he had no complaints except he does have the boil area on his left  forearm which is treated and we will follow with Dr. Clarene Duke.      Darcella Gasman. [place], N.P.       ______________________________  Cristy Hilts Jacinto Halim, MD    LRI/MEDQ  D:  04/11/2008  T:  04/12/2008  Job:  [id number]   cc:   Thereasa Solo. Little, M.D.  Geoffry Paradise, M.D.

## 2011-02-01 NOTE — Cardiovascular Report (Signed)
NAME:  Jared [name], Jared [name] NO.:  [id number]   MEDICAL RECORD NO.:  [id number]          PATIENT TYPE:  INP   LOCATION:  2923                         FACILITY:  [facility]   PHYSICIAN:  Cristy Hilts. Jacinto Halim, MD       DATE OF BIRTH:  [birth year]   DATE OF PROCEDURE:  04/08/2008  DATE OF DISCHARGE:                            CARDIAC CATHETERIZATION   PROCEDURE PERFORMED:  1. Left ventriculography.  2. Selective right and left coronary arteriography.  3. Percutaneous transluminal coronary angioplasty and stenting of the      mid right coronary artery.  4. Right femoral arteriography and closure of the right femoral      arterial access with StarClose.   INDICATIONS:  Jared [name] is a 60 year old gentleman who has  mild noncritical coronary disease by cardiac catheterization sometime in  December 2008.  He has history of hypertension, hyperlipidemia, and  bronchial asthma.  He has unilateral kidney secondary to nephrectomy  secondary to a renal cell carcinoma 5 years ago.  He was admitted via  [facility] Emergency Room and he presented with chest pain.  The patient has underlying mild ST-segment elevations, inferior and  lateral leads.  However, there was exaggeration of these ST elevations  suggestive of acute inferior injury pattern and the patient was brought  in as a STEMI directly to the cardiac catheterization lab.  With ongoing  chest pain and positive cardiac markers, he underwent emergent cardiac  catheterization.   HEMODYNAMIC DATA:  The left ventricular pressure was 145/10, with end-  diastolic pressure of 21 mmHg.  Aortic pressure was 147/88 with a mean  of 115 mmHg.  There was no pressure gradient across the aortic valve.   ANGIOGRAPHIC DATA:  Left ventricle:  Left ventricular systolic function  was normal with ejection fraction of 50-55%.  There is mild  inferolateral hypokinesis.   Right coronary artery:  Right coronary artery is a large-caliber  vessel  and a dominant vessel.  In the mid segment of right coronary artery has  a hazy ulcerated 70%-80% stenosis.  This appears to be the culprit.  There is mild luminal irregularity of the right coronary artery.   Left main coronary artery:  Left main coronary artery is a large-caliber  vessel.  His distal left main has a 20-30% eccentric stenosis.   Circumflex coronary artery:  Circumflex coronary artery is a small-to-  moderate caliber vessel with mild luminal irregularity.   Ramus intermediate:  Ramus intermediate is a large-caliber vessel.  It  gives origin to a large secondary branch.  It has got mild luminal  irregularity.   LAD:  LAD is a moderate-to-large caliber vessel with mild amount of  diffuse luminal irregularity.  It gives origin to a large diagonal 1.   INTERVENTION DATA:  Successful PTCA and direct stenting of the mid RCA  with implantation of a 3.0 x 18-mm Endeavor stent deployed at 13  atmospheres pressure for 50 seconds.  This stent was postdilated with a  3.25 x 15-mm [place] Sprinter at 17 atmospheres pressure for 30 seconds.  Overall, the stenosis was  reduced from 80% ulcerated stenosis to 0% with  TIMI III to TIMI III flow maintained at end of the procedure.   A total of 165 mL of contrast was utilized for diagnostic and  interventional procedure.   RECOMMENDATIONS:  The patient will need aggressive risk modification.  Especially, given the fact that he has a 20-30% left main disease.  He  needs very aggressive risk modification with the LDL goal of less than  or equal to 70, HDL goal of greater than or equal to 45-50 with weight  loss and exercise.   TECHNIQUE OF THE PROCEDURE:  Under usual sterile precautions, using a 6-  French right femoral arterial access, a 6-French multipurpose B2  catheter was advanced into the ascending aorta then into the left  ventricle.  Left ventriculography was performed both in LAO and RAO  projection.  The catheter was  flushed with saline, pulled into the  ascending aorta.  Right coronary selectively engaged and angiography was  performed and then the left main coronary artery selectively engaged and  angiography was performed.  Catheter then pulled out of the body.   TECHNIQUE OF INTERVENTION:  Using heparin for anticoagulation and  continuing Integralin that was already started previously a 7-French FR4  guide with side holes was utilized to engage the right coronary artery  and using an Arboriculturist, I was able to easily wire the  right coronary artery and direct stenting with a 3.0 x 80-mm Endeavor  stent was performed at 13 atmospheres pressure and followed by post  dilatation of the 3.25 x 15 mm [place] Sprinter at 17 atmospheres pressure.  Having performed this angiography with excellent results.  During the  procedure, intracoronary nitroglycerin was also administered.  The wires  were withdrawn and angiography repeated.  Guide catheter disengaged and  pulled out of the body.  The patient tolerated the procedure.  There is  no immediate complication noted.      Cristy Hilts. Jacinto Halim, MD  Electronically Signed     JRG/MEDQ  D:  04/08/2008  T:  04/09/2008  Job:  [id number]   cc:   Geoffry Paradise, M.D.  Thereasa Solo. Little, M.D.

## 2011-02-01 NOTE — Cardiovascular Report (Signed)
NAME:  Jared [name], Jared [name]             ACCOUNT NO.:  [id number]   MEDICAL RECORD NO.:  [id number]          PATIENT TYPE:  INP   LOCATION:  2030                         FACILITY:  [facility]   PHYSICIAN:  Jared [name], M.D. DATE OF BIRTH:  Jan 18, 1951   DATE OF PROCEDURE:  05/07/2009  DATE OF DISCHARGE:                            CARDIAC CATHETERIZATION   INDICATIONS FOR TEST:  This 60 year old male has known coronary disease.  He had a stent placed to his mid RCA approximately a year ago.  He has  had off and on for about a month recurrent problems of chest discomfort  with some involvement of the left arm.  He feels this is identical to  the pain he had prior to the stent being placed a year ago.  He has now  had some episodes of resting chest pain.   ALLERGIES:  He gives a history of SHELLFISH allergy.   He was premedicated with 60 mg of Solu-Medrol and 25 mg of IV Benadryl  and 20 mg of IV Pepcid.   PROCEDURE:  The patient was prepped and draped in the usual sterile  fashion exposing the right groin.  Following local anesthetic with 1%  Xylocaine, the Seldinger technique was employed and a 5-French  introducer sheath was placed in the right femoral artery.  Left and  right coronary arteriography and ventriculography in the RAO projection  x2 was performed.   COMPLICATIONS:  None and in particular, he had no reaction to the  contrast media.   TOTAL CONTRAST:  100 mL.   EQUIPMENT:  A #5-French Judkins configuration catheters.   RESULTS:  1. Hemodynamic monitoring:  His central aortic pressure was 136/77.      His left ventricular pressure was 136/13 and there was no aortic      valve gradient noted at the time of pullback.  2. Ventriculography:  Ventriculography in the RAO projection was      performed.  At the end of the procedure, 2 ventriculograms were      performed.  The first one, the pigtail catheter was in the mitral      apparatus engaged and appeared as though there  was significant      mitral regurgitation.  The pigtail catheter was repositioned at the      apex of the heart.  Contrast 26 mL at 12 mL per second revealed      good opacification of the left ventricle.  There was only +1 mitral      regurgitation.  The anterior wall, however, is mildly hypokinetic.      The ejection fraction is 45-50% and end-diastolic pressure is 24.   CORONARY ARTERIOGRAPHY:  1. Left main:  It trifurcated.  In the distal portion of the left main      before the trifurcation was a eccentric 50% area of narrowing.      When I compared this to his cath a year ago, it was only 20% then.      This is clearly worse.  2. LAD:  The LAD is relatively small.  It crossed the apex of  the      heart, gave rise to a small first diagonal and this system was free      of disease.  3. Optional diagonal:  This optional diagonal was a large vessel that      bifurcated.  It was free of disease.  4. Circumflex:  The circumflex was a small vessel with a small OM      vessel, this was free of disease.  5. Right coronary artery:  There was a stent in the midportion of the      right coronary artery.  The stent was actually oversized in      comparison to the diameter of the RCA.  There was tubular 20-30%      narrowing within the stent, but the lumen through the stent is      coupled to the vessel lumen diameter.  Distal to the stent was      another area of 20-30% narrowing that was somewhat tubular in      appearance.  The PDA and posterior lateral vessels were free of      disease.   CONCLUSION:  1. Mild left ventricular dysfunction with mildly hypokinetic anterior      wall.  2. Patent stent in the right coronary artery with no significant      stenoses.  3. A 50% distal left main disease that has clearly progressed over the      last year.   At this point, I do not have a good feel as to whether or not his chest  pain is related to the left main.  I think IVUS or flow wire is  too  dangerous in this particular setting and I am uncomfortable trying to do  a stress test somewhat to the left main lesion.  I will ask my partners  for their input.   Jared [name] had a nephrectomy in the remote past for renal cell  cancer.  He has a new lung nodule on his chest x-ray and he needs a CT  scan to resolve this.  I plan to keep him overnight to hydrate him  because he has a solitary kidney, this would give me the opportunity to  have his angiograms reviewed by my partners and also have a CT scan of  his chest performed.           ______________________________  Jared [name], M.D.     ABL/MEDQ  D:  05/07/2009  T:  05/07/2009  Job:  [id number]   cc:   Cardiac Catheterization Laboratory  Hedwig Morton. Juanda Chance, MD

## 2011-02-01 NOTE — Discharge Summary (Signed)
NAME:  Jared [name], COOLE NO.:  [id number]   MEDICAL RECORD NO.:  [id number]          PATIENT TYPE:  INP   LOCATION:  2031                         FACILITY:  [facility]   PHYSICIAN:  Thereasa Solo. Little, M.D. DATE OF BIRTH:  1951/03/10   DATE OF ADMISSION:  04/13/2008  DATE OF DISCHARGE:  04/15/2008                               DISCHARGE SUMMARY   DISCHARGE DIAGNOSES:  1. Chest pain, admitted to rule out myocardial infarction and early in-      stent restenosis, underwent catheterization on April 15, 2007, by      Dr. Lynnea Ferrier showing no changes in coronary disease and patent      stent.  2. Esophageal strictures status post dilatation by Dr. Juanda Chance on April 15, 2008.  3. Gastroesophageal reflux disease.  4. Methicillin-resistant Staphylococcus aureus cellulitis of the left      forearm.  5. Hyperlipidemia.  6. Asthma.  7. Benign prostatic hypertrophy.  8. Degenerative joint disease and back surgeries.  9. Renal cell carcinoma status post left-sided nephrectomy.   This is a 60 year old Caucasian gentleman, patient of Dr. Clarene Duke who  presented to the emergency room with complaints of chest pain.  He has  been having chest pressure and pain and feeling of acidic taste in the  mouth ever since discharge from the previous hospitalization on April 11, 2008.  Initially, the patient was admitted on April 08, 2008, underwent  cath with stenting of the RCA.  He said that these symptoms were  progressing and we admitted him to rule out MI with plan to undergo  coronary angiography to rule out early in-stent restenosis.  Cath was  performed by Dr. Lynnea Ferrier on April 14, 2008, that revealed discrete 50%  ostial lesion of the circumflex, mid RCA stent, which looked patent  without disease, normal left ventricular EF.  Because of persistent  chest discomfort, we asked GI Service of the [facility] to  evaluate the patient.  He was seen by Dr. Juanda Chance and underwent an  endoscopy by her on April 15, 2008.  Upper endoscopy showed esophageal  explanation as a result of her reflux.  Some erosions were present and  biopsy was taken of the inflamed esophageal mucosa.  The patient also  had a stricture in the distal esophagus, which was successfully dilated.  He tolerated the procedure well and Dr. Elsie Lincoln saw him post EGD, felt  that he was stable for discharge home.   HOSPITAL LABORATORIES:  His CBC revealed white blood cell count 19.4,  hemoglobin 14.5, hematocrit 43.1, and platelet count 294.  His BMP  revealed sodium 159, potassium 4.6, chloride 107, CO2 25, BUN 22,  creatinine 1.44, and glucose 113.   He had enzymes cycle x3 and they were negative.   The patient was also seen by Infectious Disease Control Service prior to  his invasive procedures and that they started him on IV vancomycin when  he was in the hospital and plan was to change it to p.o. doxycycline  when the patient will be discharged home.  He was also  using mupirocin  nasal ointment twice a day.   DISCHARGE MEDICATIONS:  1. Nexium 40 mg b.i.d.  2. Plavix 75 mg daily.  3. Aspirin 325 mg daily.  4. Singulair 10 mg daily.  5. Advair 500/50 mg 2 puffs daily.  6. Allegra as previously.  7. Pravachol 80 mg daily.  8. Flomax 0.4 mg daily.  9. Atenolol 12.5 mg b.i.d.  10.Nitroglycerin sublingual p.r.n.  11.Paxil 20 mg daily.  12.Benadryl 25 mg daily.  13.Doxycycline 100 mg b.i.d. for 14 days.  14.Mupirocin nasal ointment apply to each nostril twice a day for 14      days.      Jared [name], P.A.    ______________________________  Thereasa Solo. Little, M.D.    MK/MEDQ  D:  04/15/2008  T:  04/16/2008  Job:  [id number]   cc:   [facility]  Aileen Pilot, MD

## 2011-02-04 NOTE — Op Note (Signed)
NAME:  Jared [name], Jared [name]             ACCOUNT NO.:  [id number]   MEDICAL RECORD NO.:  [id number]          PATIENT TYPE:  AMB   LOCATION:  SDS                          FACILITY:  [facility]   PHYSICIAN:  Hilda Lias, M.D.   DATE OF BIRTH:  1950/12/20   DATE OF PROCEDURE:  11/02/2005  DATE OF DISCHARGE:                                 OPERATIVE REPORT   PREOPERATIVE DIAGNOSIS:  C5-C6 spondylosis, with chronic C6 radiculopathy.   POSTOPERATIVE DIAGNOSIS:  C5-C6 spondylosis, with chronic C6 radiculopathy.   PROCEDURE:  Anterior C5-6 diskectomy, interbody fusion with allograft,  plate, microscopy.   SURGEON:  Hilda Lias, M.D.   ASSISTANT:  Danae Orleans. Venetia Maxon, M.D.   CLINICAL HISTORY:  [name] is a gentleman who had been complaining of  pain with radiation to the right upper extremity for more than 2 years.  In  the past, he had surgery in the lumbar area.  X-ray showed spondylosis at  the level of C5-6, with compromise of the foramen bilaterally.  Surgery was  advised, and the risks were explained in the history and physical.   PROCEDURE:  The patient was taken to the O.R.  After intubation, the neck  was prepped.  A transverse incision was made through the skin, subcutaneous  tissue, platysma, down into the cervical spine.  Because he has a short  neck, we went ahead and we put the needle higher up, which was at C4-5 by x-  ray.  From then on, we had localized C5-6, and we brought the microscope  into the area.  The anterior ligament was opened, and total gross diskectomy  was accomplished.  We found quite a bit of narrowing with overgrowth of the  joints posteriorly.  We used a drill, and we drilled bilaterally to  decompress the foramen.  The posterior ligament, which was calcified, was  opened, and using the 1, 2, and 3 mm Kerrison punch we decompressed the  spinal cord as well as the foramen.  The nerve root on the right side was  swollen and reddish.  Then, the area was  irrigated.  Then, the endplates  were drilled.  We attempted to put a 7 mm graft, but it was too lose.  Then,  we went ahead with a 9 with autograft inside.  This was followed by a plate  using 4 screws.  Lateral C-spine showed good position of the upper part of  the plate.  Then, the wound was irrigated.  Hemostasis was done with  bipolar.  Once we were satisfied there was no bleeding, the area was closed  with Vicryl and Steri-Strips.           ______________________________  Hilda Lias, M.D.     EB/MEDQ  D:  11/02/2005  T:  11/02/2005  Job:  [id number]

## 2011-02-04 NOTE — H&P (Signed)
NAME:  Jared [name], Jared [name]             ACCOUNT NO.:  [id number]   MEDICAL RECORD NO.:  [id number]          PATIENT TYPE:  AMB   LOCATION:  SDS                          FACILITY:  [facility]   PHYSICIAN:  Hilda Lias, M.D.   DATE OF BIRTH:  1951/05/12   DATE OF ADMISSION:  11/02/2005  DATE OF DISCHARGE:                                HISTORY & PHYSICAL   Jared [name] is a gentleman who had been complaining of neck pain with  radiation to the right upper extremity.  This patient had surgery in the  past including his hand and the lumbar area by Dr. Roxan Hockey.  Now for the  past two years he tells me that the pain is getting worse.  He denies any  pain in the left upper extremity.  He had x-rays and because of the findings  surgery was advised.   PAST SURGICAL HISTORY:  1.  Lumbar diskectomy.  2.  Surgery on the left leg.  3.  Carpal tunnel surgery.  4.  Kidney surgery.  5.  Knee surgery.   He is allergic to PENICILLIN, SULFA, CODEINE, and OXYCODONE.   SOCIAL HISTORY:  Negative.   FAMILY HISTORY:  Father is in good condition.  He is 69 years old.   REVIEW OF SYSTEMS:  Positive for neck pain.   PHYSICAL EXAMINATION:  HEENT:  Normal.  NECK:  He has pain with movement.  LUNGS:  Clear.  HEART:  Normal.  EXTREMITIES:  Scar in the legs from previous surgery, also in the hands.  NEUROLOGIC:  Mental status normal.  Cranial nerves normal.  Strength showed  that he has weakness mostly of both biceps and the right wrist extensor.  Sensation is normal.  Reflexes normal.   Cervical spine x-rays showed that he has spondylosis mostly at the level 5-6  compromising not only the thecal sac but also the foramen.  The findings  above are minimal.   CLINICAL IMPRESSION:  Chronic radiculopathy with a C5-C6 spondylosis.   RECOMMENDATIONS:  The patient is being admitted for surgery and the  procedure will be anterior cervical diskectomy at the level 5-6 followed by  fusion.  He knows all of the  risks such as infection, CSF leak, or worsening  pain, no improvement whatsoever due to the chronicity, damage to esophagus,  damage to the vocal cord, damage to vertebral arteries, and failure of the  hardware.           ______________________________  Hilda Lias, M.D.     EB/MEDQ  D:  11/02/2005  T:  11/02/2005  Job:  [id number]

## 2011-02-04 NOTE — Cardiovascular Report (Signed)
NAME:  Jared, [name] NO.:  [id number]   MEDICAL RECORD NO.:  [id number]          PATIENT TYPE:  INP   LOCATION:  6709                         FACILITY:  [facility]   PHYSICIAN:  Jared [name], M.D. DATE OF BIRTH:  May 16, 1951   DATE OF PROCEDURE:  04/09/2007  DATE OF DISCHARGE:  05/11/2007                            CARDIAC CATHETERIZATION   INDICATIONS FOR TEST:  This 60 year old male awoke with chest pain that  was relieved in the emergency room with nitroglycerin.  His EKG is  basically normal.  His cardiac markers were unremarkable, but because of  risk factors which include hyperlipidemia and a grandfather who had an  MI in his 2s, the patient was brought to the cath lab for definitive  evaluation of his coronary anatomy.   After obtaining informed consent, the patient was prepped and draped in  the usual sterile fashion, exposing the right groin.  Following local  anesthetic with 1% Xylocaine, the Seldinger technique was employed, and  a 5-French introducer sheath was placed into the right femoral artery.  Left and right coronary arteriography and ventriculography in the RAO  projection x2 was performed.   RESULTS:  1. Hemodynamic monitoring central aortic pressure was 104/43.  The LV      pressure was 94 over -19.  There was no gradient at the time of      pullback.  When the system was balanced, it balanced at zero, but      despite this the left ventricular end-diastolic pressure was  -19.  I cannot explain this.   VENTRICULOGRAPHY:  Ventriculography in the RAO projection revealed a  normal systolic function.  The ejection fraction was greater than 55%.  Two ventriculograms were performed.  The first one:  The catheter was  underneath the aortic valve, and the apex was poorly visualized and did  not appear to be as contractile as the remaining segments.  The catheter  was repositioned to the apex, and a second ventriculogram was performed.  This  revealed minimal, if any, hypokinesis of the apex.  There was no  mitral regurgitation.  I cannot comment on his true end-diastolic  pressure.   CORONARY ARTERIOGRAPHY:  On fluoroscopy, I did not appreciate  calcification in the distribution of coronary arteries.  1. Left main.  There was terminal eccentric 20% narrowing of the left      main.  The left main trifurcated.  None of the ostium of the      trifurcating vessels were involved.  2. Circumflex.  This was a small vessel with one small OM vessel.      This was free of disease.  3. Optional diagonal:  This was a large bifurcating vessel.  It was      free of disease.  4. LAD.  The LAD extended to the apex of the heart, gave rise to a      moderate-sized diagonal, all of which were free of disease.  5. Right coronary artery.  The right coronary was a dominant vessel      giving rise to a PDA and two posterior  lateral vessels.  In the      midportion of the RCA was a tubular, 10-15 mm long area of 20% to      30%, eccentrically narrowed.  In the large PDA, there was an area      of mid-40% narrowing, and in the two posterior lateral vessels,      there was no evidence of disease.   CONCLUSION:  1. Normal LV systolic function.  2. Minimal coronary disease, none of which can explain his resting      angina.  Because he has a solitary kidney from a previous      nephrectomy for renal cell cancer, he will be maintained overnight      for hydration.   ADDENDUM:  This patient will need aggressive risk factor management,  including close attention to his lipids.  He is already on a Statin.  His current lipids are pending at the time of my dictation.           ______________________________  Jared Solo [name], M.D.     ABL/MEDQ  D:  05/10/2007  T:  05/11/2007  Job:  [id number]   cc:   Jared [name], M.D.  Cath Lab  Jared [name], M.D.

## 2011-02-04 NOTE — Discharge Summary (Signed)
[facility]  Patient:    Jared [name], Jared [name] Visit Number: [id number] MRN: [id number]          Service Type: MED Location: 3W [phone] Attending Physician:  Iva Boop Dictated by:   Mike Gip, P.A.C. Admit Date:  03/29/2002 Discharge Date: 03/31/2002   CC:         Hedwig Morton. Juanda Chance, M.D. [facility]   Discharge Summary  ADMITTING DIAGNOSES: 87. A 60 year old male with recurrent abdominal bloating, nausea, vomiting, and    diarrhea with recent increase in frequency of episodes now associated    diarrhea and leukocytosis.  Rule out recurrent partial small bowel    obstruction.  Rule out underlying motility disorder. 2. Status post left nephrectomy 1997 for renal cell carcinoma.  No evidence of    recurrence on recent CT. 3. Status post remote right inguinal hernia repair. 4. Asthma. 5. Recent mild Candida esophagitis.  DISCHARGE DIAGNOSES: 32. A 60 year old white male with recurrent nausea, vomiting, abdominal    distention, and diarrhea.  Etiology is not clear.  With current episode    resolved and no definite evidence of obstruction on plain films or small    bowel followthrough. 2. Status post left nephrectomy 1997 for renal cell carcinoma.  No evidence of    recurrence on recent CT. 3. Status post remote right inguinal hernia repair. 4. Asthma. 5. Recent mild Candida esophagitis.  CONSULTATIONS:  None.  BRIEF HISTORY:  Jared [name] is a pleasant 60 year old white male known to Dr. [name] with history as described above.  The patient does have a history of GERD and diverticular disease.  He underwent endoscopy and Maloney dilation in 2001 and last had colonoscopy in 2001 which showed left colon diverticula. The patient does have history of renal cell CA and is status post left nephrectomy in 1997.  The patient reports current onset of symptoms in November 2002 now with intermittent recurrent episodes of abdominal distention, nausea, vomiting,  and diarrhea which have been occurring every two to three weeks.  Interestingly, these episodes seem to start in the evening.  Symptoms generally last 12-24 hours and the patient reports about a 24 hour recovery period.  He has not had any associated fever, chills, melena, hematochezia, but says that he often vomits undigested food with these episodes.  In between these episodes he feels well.  His appetite has been fine.  His weight has been stable.  Bowel movements return to normal.  He last had an episode one and a half weeks ago and again had a recurrence last evening.  He had undergone CT scan of the abdomen and pelvis on July 2 while he was feeling well and this was a negative examination.  At this time he is seen and evaluated in the emergency room with recurrent abdominal distention, multiple episodes of nausea, vomiting, and watery diarrhea.  Is noted to have a white count of 19,000 with left shift. Hemoglobin 16.5, hematocrit 48.3.  Potassium 4.3, BUN 30, creatinine 1.8.  The patient is admitted to the hospital for hydration, antiemetics, and further diagnostic evaluation.  LABORATORY STUDIES:  July 11:  WBC 19.3, hemoglobin 16.5, hematocrit 48.3, MCV 86.9, platelets 335,000.  Serial values were obtained on July 13.  WBC 6.3, hemoglobin 12.4, hematocrit 36.8.  Sedimentation rate on admission 6. Electrolytes within normal limits.  BUN on admission was 30, creatinine elevated at 1.8.  Follow-up on July 12 showed a BUN of 17, creatinine 1.4. Glucose normal at 117.  Albumin 4.4.  Liver function studies normal with the exception of an SGOT at 42, amylase 61, lipase 16.  Theophylline level low at 7.7.  TSH was ordered and is listed as pending.  C1 esterase level and also listed as pending at the time of this dictation.  Urinalysis negative with the exception of 30 mg/dl of protein.  Abdominal films on July 11 showed right lower lobe atelectasis versus infiltrate, no overt bowel  obstruction, air fluid levels in the right colon. Small bowel follow through on July 12 normal.  Follow-up chest x-ray shows changes in the right base most compatible with atelectasis.  HOSPITAL COURSE:  The patient was admitted to the service of Dr. Stan Head who was covering the hospital.  He was initially started on IV fluids at 200 cc/hour.  Given antiemetics and Demerol as needed.  Clear liquid diet.  Plain films done in the emergency room did not show any evidence of obstruction.  As the patient had just had a recent CT scan, we proceeded with small bowel follow through as his symptoms are very consistent with recurrent partial obstructions.  The small bowel follow through was done the following morning and was negative.  Again, the patient very quickly improved as well as is usual for these episodes.  It was felt that small bowel capsule endoscopy would be beneficial and this was to be scheduled on an outpatient basis.  On July 12 his diet was advanced.  He tolerated this without difficulty and on July 13 was discharged to home in stable and improved condition with instructions to follow up with Dr. [name] in the office in two weeks, to call for any problems in the interim.  We will plan to schedule small bowel capsule endoscopy at that time.  DISCHARGE MEDICATIONS:  All medications same as prior to admission including: 1. Theo-Dur 300 b.i.d. 2. Ativan 1 mg t.i.d. 3. Paxil 20 q.d. 4. Flomax 0.4 q.d. 5. Advair 100/50 b.i.d. 6. Citrucel tablets on a daily basis.  DIET:  Regular.Dictated by:   Mike Gip, P.A.C. Attending Physician:  Iva Boop DD:  04/10/02 TD:  04/16/02 Job: [id number]

## 2011-02-04 NOTE — H&P (Signed)
[facility]  Patient:    Jared [name], Jared [name] Visit Number: [id number] MRN: [id number]          Service Type: MED Location: 3W [phone] Attending Physician:  Iva Boop Dictated by:   Sammuel Cooper, P.A. Admit Date:  03/29/2002 Discharge Date: 03/31/2002   CC:         Richard A. Jacky Kindle, M.D.   History and Physical  CHIEF COMPLAINT:  Recurrent episodes of abdominal distention, nausea, vomiting, and diarrhea.  HISTORY OF PRESENT ILLNESS:  The patient is a 60 year old white male, known to Dr. [name] with history of GERD and diverticular disease.  He had endoscopy and Maloney dilation in 2001.  He last had colonoscopy in 2001, as well, showing left colon diverticula.  He is status post left nephrectomy in 1997, for renal cell CA and is status post remote right inguinal hernia repair as a child.  He also has a history of asthma and BPH.  The patient reports current onset of symptoms in November 2002, with intermittent episodes of abdominal distention followed by nausea, vomiting, and diarrhea, which have been occurring every two to three weeks, usually start in the evening, symptoms lasting 12-24 hours and then takes about 24 hours to recuperate.  These episodes have not been associated with fever, chills, melena, hematochezia, though he states that he often vomits undigested food.  In between these episodes he has, apparently, felt well.  Appetite has been fine, weight has been stable, bowel movements have been normal, etc.  The patient had an episode 1-1/2 to two weeks ago and then recurred again last evening.  He had recent evaluation with Dr. Juanda Chance because of these episodes, with endoscopy last week showing only mild Candida esophagitis.  He underwent CT scan of the abdomen and pelvis on March 20, 2002, which was negative.  This was done one week after his episode had occurred, and he was feeling well at the time.  Now with onset last  evening with similar episode initially with a bloated, distended abdomen and tightness and multiple episodes of nausea, vomiting, and watery diarrhea.  He came to the emergency room this morning.  Also complains of vague headache which he says he has had in the past with these episodes. Laboratory studies show a white count of 19,000 with greater than 20% bands, hemoglobin 16.5, hematocrit 48.3.  Potassium 4.3, BUN 30, creatinine 1.8. Liver function studies are normal with the exception of an SGOT of 43.  UA is negative.  The patient is admitted at this time for hydration and further diagnostic evaluation.  CURRENT MEDICATIONS: 1. Theo-Dur 300 b.i.d. 2. Ativan 1 mg t.i.d. 3. Paxil 20 q.d. 4. Flomax 0.4 q.d. 5. Advair 100/50 b.i.d. 6. Citrucel 4 p.o. q.d.  ALLERGIES:  PENICILLIN and SULFA, which cause hives.  CODEINE, which causes nausea.  IODINE, SHELLFISH allergy.  PAST MEDICAL HISTORY: 1. Left nephrectomy in 1997. 2. Remote right inguinal hernia repair. 3. Asthma. 4. Diverticular disease. 5. History of GERD and prior esophageal dilatation.  FAMILY HISTORY:  Father deceased with esophageal CA.  One sister with diabetes.  Grandfather deceased with an MI.  SOCIAL HISTORY:  The patient is married.  He has a Leisure centre manager. No ETOH.  No tobacco.  REVIEW OF SYSTEMS:  CARDIOVASCULAR:  Denies any chest pain or anginal symptoms.  PULMONARY:  Pertinent for asthma, which has been under good control.  He does have occasional cough.  No sputum production. GENITOURINARY:  Pertinent  for recent diagnosis of BPH, on Flomax. MUSCULOSKELETAL:  Negative.  GASTROINTESTINAL:  As above.  PHYSICAL EXAMINATION:  GENERAL:  Well-developed white male, uncomfortable and ill-appearing.  He is alert and oriented x3.  VITAL SIGNS:  Temperature 97, blood pressure 130/70, pulse 120 on arrival, orthostatic blood pressures were done, and no orthostatic changes noted.  HEENT:  Normocephalic,  atraumatic.  EOMI.  PERRLA.  Sclerae anicteric.  Buccal mucosa is dry.  NECK:  Supple.  Without nodes.  He does have a few facial telangiectasia.  CARDIOVASCULAR:  Tachycardic, regular rhythm, with S1 and S2.  LUNGS:  Clear to A&P.  ABDOMEN:  Soft.  Bowel sounds are hyperactive, with occasional rushes.  He has a left nephrectomy scar.  He is mildly tender bilateral lower quadrants. There is no guarding or rebound.  RECTAL:  Heme-negative and without mass.  EXTREMITIES:  No clubbing, cyanosis, or edema.  Pulses are intact.  There is a long scar on his left shin.  NEUROLOGIC:  Nonfocal.  IMPRESSION: 1. The patient is a 60 year old white male with recurrent abdominal bloating,    nausea, vomiting, and diarrhea, with recent increase in frequency of    episodes, now with associated dehydration and leukocytosis:  Rule out    recurrent bowel obstruction.  Rule out underlying motility disorder. 2. Status post left nephrectomy in 1997, for renal cell carcinoma:  No    evidence of recurrence on recent CT. 3. Status post remote right inguinal hernia repair. 4. Asthma. 5. Recent mild Candida esophagitis.  PLAN:  The patient is admitted to the service of Dr. Stan Head, who is covering the hospital.  He will be rehydrated.  Will check plain abdominal films and then decide on repeat CT versus barium studies, depending on findings on plain film.  Will check theophylline level, TSH, sedimentation rate, amylase and lipase, and follow up laboratories in the a.m.  For details, please see the orders. Dictated by:   Sammuel Cooper, P.A. Attending Physician:  Iva Boop DD:  03/29/02 TD:  04/01/02 Job: [id number]

## 2011-06-17 LAB — CBC
HCT: 42
HCT: 42.4
HCT: 42.8
HCT: 43.1
HCT: 43.6
HCT: 45
HCT: 46.9
Hemoglobin: 13.9
Hemoglobin: 14.2
Hemoglobin: 14.4
Hemoglobin: 14.4
Hemoglobin: 14.5
Hemoglobin: 15
Hemoglobin: 15.6
MCHC: 33
MCHC: 33
MCHC: 33.3
MCHC: 33.4
MCHC: 33.6
MCHC: 33.6
MCHC: 33.3
MCV: 85.4
MCV: 85.4
MCV: 85.8
MCV: 85.8
MCV: 86.5
MCV: 86.7
MCV: 85.1
Platelets: 243
Platelets: 247
Platelets: 248
Platelets: 255
Platelets: 294
Platelets: 302
Platelets: 252
RBC: 4.85
RBC: 4.96
RBC: 5.01
RBC: 5.03
RBC: 5.04
RBC: 5.24
RBC: 5.52
RDW: 14.1
RDW: 14.1
RDW: 14.2
RDW: 14.3
RDW: 14.3
RDW: 14.6
RDW: 14.2
WBC: 11.3 — ABNORMAL HIGH
WBC: 11.5 — ABNORMAL HIGH
WBC: 11.8 — ABNORMAL HIGH
WBC: 19.4 — ABNORMAL HIGH
WBC: 8.7
WBC: 9.6
WBC: 7.9

## 2011-06-17 LAB — CARDIAC PANEL(CRET KIN+CKTOT+MB+TROPI)
CK, MB: 19.2 — ABNORMAL HIGH
CK, MB: 2
CK, MB: 2.5
CK, MB: 20.4 — ABNORMAL HIGH
CK, MB: 37.3 — ABNORMAL HIGH
Relative Index: 11.3 — ABNORMAL HIGH
Relative Index: 11.5 — ABNORMAL HIGH
Relative Index: 13.1 — ABNORMAL HIGH
Relative Index: INVALID
Relative Index: INVALID
Total CK: 170
Total CK: 177
Total CK: 285 — ABNORMAL HIGH
Total CK: 45
Total CK: 50
Troponin I: 0.35 — ABNORMAL HIGH
Troponin I: 0.45 — ABNORMAL HIGH
Troponin I: 2.14
Troponin I: 2.15
Troponin I: 3.3

## 2011-06-17 LAB — BASIC METABOLIC PANEL
BUN: 21
BUN: 22
BUN: 22
BUN: 24 — ABNORMAL HIGH
BUN: 26 — ABNORMAL HIGH
BUN: 37 — ABNORMAL HIGH
CO2: 23
CO2: 25
CO2: 25
CO2: 27
CO2: 28
CO2: 28
Calcium: 8.2 — ABNORMAL LOW
Calcium: 8.6
Calcium: 8.6
Calcium: 8.7
Calcium: 8.9
Calcium: 9.2
Chloride: 106
Chloride: 107
Chloride: 109
Chloride: 110
Chloride: 98
Chloride: 98
Creatinine, Ser: 1.34
Creatinine, Ser: 1.4
Creatinine, Ser: 1.44
Creatinine, Ser: 1.64 — ABNORMAL HIGH
Creatinine, Ser: 1.68 — ABNORMAL HIGH
Creatinine, Ser: 1.75 — ABNORMAL HIGH
GFR calc Af Amer: 49 — ABNORMAL LOW
GFR calc Af Amer: 51 — ABNORMAL LOW
GFR calc Af Amer: 52 — ABNORMAL LOW
GFR calc Af Amer: >60
GFR calc Af Amer: >60
GFR calc Af Amer: >60
GFR calc non Af Amer: 40 — ABNORMAL LOW
GFR calc non Af Amer: 42 — ABNORMAL LOW
GFR calc non Af Amer: 43 — ABNORMAL LOW
GFR calc non Af Amer: 50 — ABNORMAL LOW
GFR calc non Af Amer: 52 — ABNORMAL LOW
GFR calc non Af Amer: 55 — ABNORMAL LOW
Glucose, Bld: 113 — ABNORMAL HIGH
Glucose, Bld: 119 — ABNORMAL HIGH
Glucose, Bld: 171 — ABNORMAL HIGH
Glucose, Bld: 89
Glucose, Bld: 91
Glucose, Bld: 92
Potassium: 4.1
Potassium: 4.2
Potassium: 4.3
Potassium: 4.4
Potassium: 4.4
Potassium: 4.6
Sodium: 133 — ABNORMAL LOW
Sodium: 137
Sodium: 138
Sodium: 139
Sodium: 139
Sodium: 142

## 2011-06-17 LAB — TROPONIN I
Troponin I: 0.59
Troponin I: 3.85

## 2011-06-17 LAB — WOUND CULTURE: Gram Stain: NONE SEEN

## 2011-06-17 LAB — HEPARIN LEVEL (UNFRACTIONATED)
Heparin Unfractionated: 0.53
Heparin Unfractionated: 0.58
Heparin Unfractionated: 1.24 — ABNORMAL HIGH
Heparin Unfractionated: <0.1 — ABNORMAL LOW

## 2011-06-17 LAB — CK TOTAL AND CKMB (NOT AT ARMC)
CK, MB: 2.5
CK, MB: 49.1 — ABNORMAL HIGH
Relative Index: INVALID
Relative Index: 13.6 — ABNORMAL HIGH
Total CK: 61
Total CK: 362 — ABNORMAL HIGH

## 2011-06-17 LAB — PLATELET COUNT: Platelets: 258

## 2011-06-17 LAB — DIFFERENTIAL
Basophils Absolute: 0
Basophils Absolute: 0
Basophils Relative: 0
Basophils Relative: 0
Eosinophils Absolute: 0.4
Eosinophils Absolute: 0.4
Eosinophils Relative: 3
Eosinophils Relative: 5
Lymphocytes Relative: 20
Lymphocytes Relative: 22
Lymphs Abs: 2.4
Lymphs Abs: 1.7
Monocytes Absolute: 1.1 — ABNORMAL HIGH
Monocytes Absolute: 0.8
Monocytes Relative: 10
Monocytes Relative: 10
Neutro Abs: 7.8 — ABNORMAL HIGH
Neutro Abs: 5
Neutrophils Relative %: 67
Neutrophils Relative %: 63

## 2011-06-17 LAB — HEMOGLOBIN A1C
Hgb A1c MFr Bld: 6
Mean Plasma Glucose: 136

## 2011-06-17 LAB — COMPREHENSIVE METABOLIC PANEL
ALT: 37
AST: 46 — ABNORMAL HIGH
Albumin: 3.6
Alkaline Phosphatase: 54
BUN: 30 — ABNORMAL HIGH
CO2: 24
Calcium: 8.8
Chloride: 105
Creatinine, Ser: 1.3
GFR calc Af Amer: >60
GFR calc non Af Amer: 57 — ABNORMAL LOW
Glucose, Bld: 92
Potassium: 3.8
Sodium: 138
Total Bilirubin: 0.7
Total Protein: 7.1

## 2011-06-17 LAB — LIPID PANEL
Cholesterol: 229 — ABNORMAL HIGH
HDL: 41
LDL Cholesterol: 150 — ABNORMAL HIGH
Total CHOL/HDL Ratio: 5.6
Triglycerides: 192 — ABNORMAL HIGH
VLDL: 38

## 2011-06-17 LAB — PROTIME-INR
INR: 0.9
INR: 0.9
INR: 0.9
Prothrombin Time: 12.4
Prothrombin Time: 12.7
Prothrombin Time: 12.5

## 2011-06-17 LAB — POCT CARDIAC MARKERS
CKMB, poc: 1.9
CKMB, poc: 30
Myoglobin, poc: 82.2
Myoglobin, poc: 124
Operator id: 192351
Operator id: 280141
Troponin i, poc: 0.35 — ABNORMAL HIGH
Troponin i, poc: 2.54

## 2011-06-17 LAB — B-NATRIURETIC PEPTIDE (CONVERTED LAB)
Pro B Natriuretic peptide (BNP): <30
Pro B Natriuretic peptide (BNP): <30

## 2011-06-17 LAB — TSH: TSH: 1.602

## 2011-06-17 LAB — MAGNESIUM: Magnesium: 2.2

## 2011-06-17 LAB — APTT
aPTT: 28
aPTT: 27

## 2011-06-17 LAB — LIPASE, BLOOD: Lipase: 15

## 2011-07-01 LAB — I-STAT 8, (EC8 V) (CONVERTED LAB)
Acid-Base Excess: 1
BUN: 28 — ABNORMAL HIGH
Bicarbonate: 27.3 — ABNORMAL HIGH
Chloride: 105
Glucose, Bld: 93
HCT: 50
Hemoglobin: 17
Operator id: 279831
Potassium: 4.1
Sodium: 140
TCO2: 29
pCO2, Ven: 47.3
pH, Ven: 7.369 — ABNORMAL HIGH

## 2011-07-01 LAB — CBC
HCT: 43.8
HCT: 46.8
Hemoglobin: 14.6
Hemoglobin: 15.6
MCHC: 33.4
MCHC: 33.4
MCV: 84.6
MCV: 85.7
Platelets: 262
Platelets: 304
RBC: 5.18
RBC: 5.46
RDW: 13.5
RDW: 13.8
WBC: 5.8
WBC: 7.1

## 2011-07-01 LAB — COMPREHENSIVE METABOLIC PANEL WITH GFR
ALT: 41
AST: 43 — ABNORMAL HIGH
Albumin: 3.8
Alkaline Phosphatase: 63
BUN: 25 — ABNORMAL HIGH
CO2: 28
Calcium: 9.3
Chloride: 108
Creatinine, Ser: 1.39
GFR calc non Af Amer: 52 — ABNORMAL LOW
Glucose, Bld: 92
Potassium: 4.4
Sodium: 142
Total Bilirubin: 1.6 — ABNORMAL HIGH
Total Protein: 7.1

## 2011-07-01 LAB — BASIC METABOLIC PANEL
BUN: 25 — ABNORMAL HIGH
CO2: 29
Calcium: 9.1
Chloride: 108
Creatinine, Ser: 1.47
GFR calc Af Amer: 60 — ABNORMAL LOW
GFR calc non Af Amer: 49 — ABNORMAL LOW
Glucose, Bld: 85
Potassium: 4.2
Sodium: 143

## 2011-07-01 LAB — LIPID PANEL
HDL: 42
Total CHOL/HDL Ratio: 4.6
Triglycerides: 141
VLDL: 28

## 2011-07-01 LAB — DIFFERENTIAL
Basophils Absolute: 0
Basophils Relative: 1
Eosinophils Absolute: 0.3
Eosinophils Relative: 6 — ABNORMAL HIGH
Lymphocytes Relative: 29
Lymphs Abs: 1.7
Monocytes Absolute: 0.6
Monocytes Relative: 11
Neutro Abs: 3.1
Neutrophils Relative %: 55

## 2011-07-01 LAB — POCT I-STAT CREATININE
Creatinine, Ser: 1.5
Operator id: 279831

## 2011-07-01 LAB — PROTIME-INR
INR: 1
Prothrombin Time: 13

## 2011-07-01 LAB — POCT CARDIAC MARKERS
CKMB, poc: 2.5
Myoglobin, poc: 118
Operator id: 279831
Troponin i, poc: <0.05

## 2011-07-01 LAB — CK TOTAL AND CKMB (NOT AT ARMC)
CK, MB: 3.5
Relative Index: 2.6 — ABNORMAL HIGH
Total CK: 136

## 2011-07-01 LAB — MAGNESIUM: Magnesium: 2.4

## 2011-07-01 LAB — TROPONIN I: Troponin I: 0.01

## 2011-07-01 LAB — TSH: TSH: 5.844 — ABNORMAL HIGH

## 2011-08-25 ENCOUNTER — Other Ambulatory Visit: Payer: Self-pay | Admitting: Anesthesiology

## 2011-08-25 ENCOUNTER — Ambulatory Visit
Admission: RE | Admit: 2011-08-25 | Discharge: 2011-08-25 | Disposition: A | Discharge status: Home / Self Care | Payer: Commercial Indemnity | Source: Ambulatory Visit | Attending: Anesthesiology | Admitting: Anesthesiology

## 2011-08-25 DIAGNOSIS — J189 Pneumonia, unspecified organism: Secondary | ICD-10-CM

## 2011-09-15 ENCOUNTER — Other Ambulatory Visit: Payer: Self-pay | Admitting: Neurosurgery

## 2011-09-15 DIAGNOSIS — M545 Low back pain, unspecified: Secondary | ICD-10-CM

## 2011-09-15 DIAGNOSIS — M5126 Other intervertebral disc displacement, lumbar region: Secondary | ICD-10-CM

## 2011-09-16 ENCOUNTER — Other Ambulatory Visit: Payer: Commercial Indemnity

## 2011-09-21 ENCOUNTER — Ambulatory Visit
Admission: RE | Admit: 2011-09-21 | Discharge: 2011-09-21 | Disposition: A | Discharge status: Home / Self Care | Payer: Commercial Indemnity | Source: Ambulatory Visit | Attending: Neurosurgery | Admitting: Neurosurgery

## 2011-09-21 DIAGNOSIS — M5126 Other intervertebral disc displacement, lumbar region: Secondary | ICD-10-CM

## 2011-09-21 DIAGNOSIS — M545 Low back pain, unspecified: Secondary | ICD-10-CM

## 2011-09-21 MED ORDER — METHYLPREDNISOLONE ACETATE 40 MG/ML INJ SUSP (RADIOLOG
120.0000 mg | Freq: Once | INTRAMUSCULAR | Status: AC
  Administered 2011-09-21: 120 mg via EPIDURAL

## 2011-09-21 MED ORDER — IOHEXOL 180 MG/ML  SOLN
1.0000 mL | Freq: Once | INTRAMUSCULAR | Status: AC | PRN
  Administered 2011-09-21: 1 mL via EPIDURAL

## 2011-09-23 ENCOUNTER — Other Ambulatory Visit: Payer: Self-pay | Admitting: Allergy and Immunology

## 2011-09-23 ENCOUNTER — Ambulatory Visit
Admission: RE | Admit: 2011-09-23 | Discharge: 2011-09-23 | Disposition: A | Discharge status: Home / Self Care | Payer: Commercial Indemnity | Source: Ambulatory Visit | Attending: Allergy and Immunology | Admitting: Allergy and Immunology

## 2011-09-23 DIAGNOSIS — Z09 Encounter for follow-up examination after completed treatment for conditions other than malignant neoplasm: Secondary | ICD-10-CM

## 2011-10-06 ENCOUNTER — Other Ambulatory Visit: Payer: Self-pay | Admitting: Neurosurgery

## 2011-10-06 DIAGNOSIS — M5410 Radiculopathy, site unspecified: Secondary | ICD-10-CM

## 2011-10-06 DIAGNOSIS — M549 Dorsalgia, unspecified: Secondary | ICD-10-CM

## 2011-10-10 ENCOUNTER — Ambulatory Visit
Admission: RE | Admit: 2011-10-10 | Discharge: 2011-10-10 | Disposition: A | Discharge status: Home / Self Care | Payer: Commercial Indemnity | Source: Ambulatory Visit | Attending: Neurosurgery | Admitting: Neurosurgery

## 2011-10-10 DIAGNOSIS — M549 Dorsalgia, unspecified: Secondary | ICD-10-CM

## 2011-10-10 DIAGNOSIS — M5410 Radiculopathy, site unspecified: Secondary | ICD-10-CM

## 2011-10-10 MED ORDER — METHYLPREDNISOLONE ACETATE 40 MG/ML INJ SUSP (RADIOLOG
120.0000 mg | Freq: Once | INTRAMUSCULAR | Status: AC
  Administered 2011-10-10: 120 mg via EPIDURAL

## 2011-10-10 MED ORDER — IOHEXOL 180 MG/ML  SOLN
1.0000 mL | Freq: Once | INTRAMUSCULAR | Status: AC | PRN
  Administered 2011-10-10: 1 mL via EPIDURAL

## 2011-10-11 ENCOUNTER — Encounter: Payer: Self-pay | Admitting: Pulmonary Disease

## 2011-10-12 ENCOUNTER — Encounter: Payer: Self-pay | Admitting: Pulmonary Disease

## 2011-10-12 ENCOUNTER — Ambulatory Visit (INDEPENDENT_AMBULATORY_CARE_PROVIDER_SITE_OTHER): Payer: Commercial Indemnity | Admitting: Pulmonary Disease

## 2011-10-12 DIAGNOSIS — J479 Bronchiectasis, uncomplicated: Secondary | ICD-10-CM

## 2011-10-12 DIAGNOSIS — J984 Other disorders of lung: Secondary | ICD-10-CM

## 2011-10-12 NOTE — Patient Instructions (Signed)
Will schedule you for a scan of chest to re-evaluate your right sided bronchial anatomy.  Will arrange followup once results are available.

## 2011-10-12 NOTE — Assessment & Plan Note (Signed)
CT chest [facility] 2009:  RLL and RML bronchiectasis, 5mm nodule LLL CT chest 03/2010:  Mild chronic volume loss RML (not HRCT so hard to eval for bronchiectasis), chronic nodularity RLL, stable    nodule LLL CXR 08/2012:  RLL infiltrate (near total resolution on f/u 09/2011). Quantitative Ig's 2013: normal except increased IgE.   The patient has a history of right lower lobe and some right middle lobe bronchiectasis dating back to 2009.  He tells me that he requires antibiotics approximately 2-3 times a year at the most for "bronchitis", but recently had a right lower lobe pneumonia.  He has not had a followup CT since the summer of 2011, and I think it would be worthwhile to do this again for reevaluation.  I have had long discussion with him about bronchiectasis, and how we typically manage the disease.  If he has isolated bronchiectasis in the right lower lobe on followup CT, and he begins to have more frequent pulmonary infections, one option would be to consider wedge resection.  The patient has had recent quantitative immunoglobulins that were adequate.  I will see the patient back in followup after his CT chest is done for further recommendations.

## 2011-10-12 NOTE — Assessment & Plan Note (Signed)
The patient has a history of a left lower lobe nodule that has documented stability over 2 years in the past.  Therefore it is consistent with benign disease.  The patient has liver and splenic calcifications on CT scans in the past, and did live in various areas of [place].  This raises the question of possible old coccidiomycosis.

## 2011-10-12 NOTE — Progress Notes (Signed)
  Subjective:    Patient ID: Jared [name], male    DOB: November 11, 1950, 61 y.o.   MRN: [id number]  HPI The patient is a 61 year old male who had been asked to see for recurrent pulmonary infections.  He is known to me from an evaluation in 2009, where he was found to have right lower lobe and mild right middle lobe bronchiectasis.  He had a followup scan in 2011 which showed stability of his pulmonary nodules, but there was mild subsegmental atelectasis of the right middle lobe.  He has been lost to followup since that time.  He comes in today where he has had a recent right lower lobe pneumonia, but followup chest x-ray showed near complete resolution after treatment with antibiotics.  He is being treated for asthma by Dr. Lucie Leather.  The patient feels that he is nearly back to his usual baseline.  The patient relates about 2-3 pulmonary infections a year at the most which require antibiotics.  It will typically flared his asthma during this time period.  He has very little cough or mucus production in between these episodes.  He has had quantitative immunoglobulins done recently that were adequate.   Review of Systems  Constitutional: Negative for fever and unexpected weight change.  HENT: Negative for ear pain, nosebleeds, congestion, sore throat, rhinorrhea, sneezing, trouble swallowing, dental problem, postnasal drip and sinus pressure.   Eyes: Negative for redness and itching.  Respiratory: Positive for shortness of breath. Negative for cough, chest tightness and wheezing.   Cardiovascular: Negative for palpitations and leg swelling.  Gastrointestinal: Negative for nausea and vomiting.  Genitourinary: Negative for dysuria.  Musculoskeletal: Positive for joint swelling.  Skin: Negative for rash.  Neurological: Negative for headaches.  Hematological: Does not bruise/bleed easily.  Psychiatric/Behavioral: Negative for dysphoric mood. The patient is not nervous/anxious.        Objective:   Physical Exam Constitutional:  Well developed, no acute distress  HENT:  Nares patent without discharge, but mild mucosal inflammation  Oropharynx without exudate, palate and uvula are normal  Eyes:  Perrla, eomi, no scleral icterus  Neck:  No JVD, no TMG  Cardiovascular:  Normal rate, regular rhythm, no rubs or gallops.  No murmurs        Intact distal pulses  Pulmonary :  Normal breath sounds, no stridor or respiratory distress   No rales, rhonchi, or wheezing  Abdominal:  Soft, nondistended, bowel sounds present.  No tenderness noted.   Musculoskeletal:  No lower extremity edema noted.  Lymph Nodes:  No cervical lymphadenopathy noted  Skin:  No cyanosis noted  Neurologic:  Alert, appropriate, moves all 4 extremities without obvious deficit.         Assessment & Plan:

## 2011-10-14 ENCOUNTER — Ambulatory Visit
Admission: RE | Admit: 2011-10-14 | Discharge: 2011-10-14 | Disposition: A | Discharge status: Home / Self Care | Payer: Commercial Indemnity | Source: Ambulatory Visit | Attending: Pulmonary Disease | Admitting: Pulmonary Disease

## 2011-10-14 DIAGNOSIS — J479 Bronchiectasis, uncomplicated: Secondary | ICD-10-CM

## 2011-10-18 ENCOUNTER — Other Ambulatory Visit: Payer: Self-pay | Admitting: Pulmonary Disease

## 2011-10-18 DIAGNOSIS — J984 Other disorders of lung: Secondary | ICD-10-CM

## 2011-10-23 ENCOUNTER — Ambulatory Visit (INDEPENDENT_AMBULATORY_CARE_PROVIDER_SITE_OTHER): Payer: Managed Care, Other (non HMO) | Admitting: Family Medicine

## 2011-10-23 ENCOUNTER — Ambulatory Visit: Payer: Managed Care, Other (non HMO)

## 2011-10-23 VITALS — BP 138/78 | HR 84 | Temp 98.1°F | Resp 18 | Ht 66.0 in | Wt 174.2 lb

## 2011-10-23 DIAGNOSIS — R0781 Pleurodynia: Secondary | ICD-10-CM

## 2011-10-23 DIAGNOSIS — R079 Chest pain, unspecified: Secondary | ICD-10-CM

## 2011-10-23 NOTE — Progress Notes (Signed)
  Subjective:    Patient ID: Jared [name], male    DOB: Apr 17, 1951, 61 y.o.   MRN: [id number]  Chest Pain  This is a new problem. The current episode started today. The onset quality is sudden. The problem occurs constantly. The problem has been unchanged. The pain is present in the lateral region. The pain is at a severity of 7/10. The pain is severe. The quality of the pain is described as stabbing. The pain does not radiate. Associated symptoms include back pain. Pertinent negatives include no abdominal pain, cough, fever, nausea, shortness of breath, sputum production, syncope or vomiting. The pain is aggravated by breathing, deep breathing and movement. He has tried nothing for the symptoms. The treatment provided no relief. Risk factors include sedentary lifestyle and male gender.  His past medical history is significant for CAD and MI.  Pertinent negatives for past medical history include no PE and no thyroid problem. Prior diagnostic workup includes chest x-ray.   Scar seen on CT scan without contrast two weeks ago as a followup to pneumonia in December.   Review of Systems  Constitutional: Negative for fever.  Respiratory: Negative for cough, sputum production and shortness of breath.   Cardiovascular: Positive for chest pain. Negative for syncope.  Gastrointestinal: Negative for nausea, vomiting and abdominal pain.  Musculoskeletal: Positive for back pain.       Objective:   Physical Exam  NAD,  Color good, alert and appropriate. HEENT unremarkable.  CHest clear to auscultation,  Tender right pectoralis major muscle group.  [facility] reading (PRIMARY) by  Dr. Milus Glazier no acute changes      Assessment & Plan:  Chest wall .  Patient has own meds.  Refuses prednisone.  Cannot tolerate contrast because of nephrectomy

## 2011-10-23 NOTE — Patient Instructions (Signed)
Chest Wall Pain Chest wall pain is pain in or around the bones and muscles of your chest. This may occur:   On its own (spontaneously).   After a viral illness such as the flu.   Through injur.   From coughing.   Minor exercise.  It may take up to 6 weeks to get better; longer if you must stay physically active in your work and activities. HOME CARE INSTRUCTIONS   Avoid over-tiring physical activity. Try not to strain or perform activities which cause pain. This would include any activities using chest, belly (abdominal) and side muscles, especially if heavy weights are used.   Use ice on the painful area for 15 to 20 minutes per hour while awake for the first 2 days. Place the ice in a plastic bag and place a towel between the bag of ice and your skin.   Only take over-the-counter or prescription medicines for pain, discomfort, or fever as directed by your caregiver.  SEEK IMMEDIATE MEDICAL CARE IF:   Your pain increases or you are very uncomfortable.   An oral temperature above 102 F (38.9 C)develops.   Your chest pains become worse.   You develop new, unexplained problems (symptoms).   You develop nausea, vomiting, sweating or feel light headed.   You develop a cough which produces phlegm (sputum) or you cough up blood.  MAKE SURE YOU:   Understand these instructions.   Will watch your condition.   Will get help right away if you are not doing well or get worse.  Document Released: 09/05/2005 Document Revised: 03/21/2011 Document Reviewed: 04/23/2008 [employer] Patient Information 2012 [employer]. 

## 2011-10-25 ENCOUNTER — Other Ambulatory Visit: Payer: Self-pay | Admitting: Neurosurgery

## 2011-10-25 DIAGNOSIS — M549 Dorsalgia, unspecified: Secondary | ICD-10-CM

## 2011-10-25 DIAGNOSIS — M5126 Other intervertebral disc displacement, lumbar region: Secondary | ICD-10-CM

## 2011-10-25 DIAGNOSIS — M5410 Radiculopathy, site unspecified: Secondary | ICD-10-CM

## 2011-10-26 ENCOUNTER — Ambulatory Visit
Admission: RE | Admit: 2011-10-26 | Discharge: 2011-10-26 | Disposition: A | Discharge status: Home / Self Care | Payer: Managed Care, Other (non HMO) | Source: Ambulatory Visit | Attending: Neurosurgery | Admitting: Neurosurgery

## 2011-10-26 ENCOUNTER — Other Ambulatory Visit: Payer: Self-pay | Admitting: Neurosurgery

## 2011-10-26 VITALS — BP 139/77 | HR 84

## 2011-10-26 DIAGNOSIS — M5126 Other intervertebral disc displacement, lumbar region: Secondary | ICD-10-CM

## 2011-10-26 DIAGNOSIS — M5410 Radiculopathy, site unspecified: Secondary | ICD-10-CM

## 2011-10-26 DIAGNOSIS — M549 Dorsalgia, unspecified: Secondary | ICD-10-CM

## 2011-10-26 MED ORDER — METHYLPREDNISOLONE ACETATE 40 MG/ML INJ SUSP (RADIOLOG
120.0000 mg | Freq: Once | INTRAMUSCULAR | Status: AC
  Administered 2011-10-26: 120 mg via EPIDURAL

## 2011-10-26 MED ORDER — IOHEXOL 180 MG/ML  SOLN
1.0000 mL | Freq: Once | INTRAMUSCULAR | Status: AC | PRN
  Administered 2011-10-26: 1 mL via EPIDURAL

## 2012-01-06 ENCOUNTER — Telehealth: Payer: Self-pay | Admitting: Internal Medicine

## 2012-01-06 NOTE — Telephone Encounter (Signed)
Spoke with Jared [name]. Patient is having bowel changes. Patient is going to be out of town until 01/15/12. Scheduled patient on 01/23/12 at 8:45 AM for OV.

## 2012-01-11 ENCOUNTER — Encounter: Payer: Self-pay | Admitting: *Deleted

## 2012-01-12 ENCOUNTER — Encounter: Payer: Self-pay | Admitting: *Deleted

## 2012-01-12 ENCOUNTER — Other Ambulatory Visit: Payer: Managed Care, Other (non HMO)

## 2012-01-16 ENCOUNTER — Ambulatory Visit (INDEPENDENT_AMBULATORY_CARE_PROVIDER_SITE_OTHER)
Admission: RE | Admit: 2012-01-16 | Discharge: 2012-01-16 | Disposition: A | Discharge status: Home / Self Care | Payer: BC Managed Care – PPO | Source: Ambulatory Visit | Attending: Pulmonary Disease | Admitting: Pulmonary Disease

## 2012-01-16 DIAGNOSIS — J984 Other disorders of lung: Secondary | ICD-10-CM

## 2012-01-18 ENCOUNTER — Encounter: Payer: Self-pay | Admitting: Pulmonary Disease

## 2012-01-23 ENCOUNTER — Ambulatory Visit (INDEPENDENT_AMBULATORY_CARE_PROVIDER_SITE_OTHER): Payer: BC Managed Care – PPO | Admitting: Internal Medicine

## 2012-01-23 ENCOUNTER — Encounter: Payer: Self-pay | Admitting: Internal Medicine

## 2012-01-23 DIAGNOSIS — R198 Other specified symptoms and signs involving the digestive system and abdomen: Secondary | ICD-10-CM

## 2012-01-23 DIAGNOSIS — K5900 Constipation, unspecified: Secondary | ICD-10-CM

## 2012-01-23 MED ORDER — PSYLLIUM 28 % PO PACK: PACK | ORAL | Status: DC

## 2012-01-23 MED ORDER — DICYCLOMINE HCL 10 MG PO CAPS: 10.0000 mg | ORAL_CAPSULE | ORAL | Status: DC

## 2012-01-23 MED ORDER — PEG-KCL-NACL-NASULF-NA ASC-C 100 G PO SOLR: 1.0000 | Freq: Once | ORAL | Status: DC

## 2012-01-23 NOTE — Patient Instructions (Addendum)
You have been scheduled for a colonoscopy with propofol. Please follow written instructions given to you at your visit today.  Please pick up your prep kit at the pharmacy within the next 1-3 days. Please purchase Metamucil over the counter. Take as directed. We have sent the following medications to your pharmacy for you to pick up at your convenience: Bentyl  CC: Dr Geoffry Paradise

## 2012-01-23 NOTE — Progress Notes (Signed)
Jared [name] October 13, 1950 MRN [id number]    History of Present Illness:  This is a 61 year old white male with a change in bowel habits of 6 months duration. He describes small, soft, frequent stools especially in the morning. He has been on a high fiber diet. He denies abdominal pain. He describes excessive mucus but no blood. Patient underwent a back surgery by Dr. Gerlene Fee 3 months ago. He was on bedrest and pain medications. The change in bowel habits proceeded his back surgery. His father died of gastric cancer 18 years ago. Patient had a colonoscopy in 2001 and in 2005 with findings of moderately severe diverticulosis of the left colon. He has a history of esophageal stricture and gastroesophageal reflux which is currently under good control on Protonix 40 mg daily.  Past Medical History  Diagnosis Date  . Asthma   . Allergic rhinitis   . Heart attack 2009  . Renal cell carcinoma 1997    Left kidney  . GERD (gastroesophageal reflux disease)   . IBS (irritable bowel syndrome)   . Diverticulosis   . History of esophageal stricture   . Internal hemorrhoids   . Depression   . Osteoarthritis   . Peripheral neuropathy    Past Surgical History  Procedure Date  . Back surgery 1992, 2013  . Neck surgery 2005  . Coronary stent placement   . Femur fracture surgery 1995    Left leg  . Total knee arthroplasty 2005    left  . Carpal tunnel release 2000    bilat.  . Nephrectomy     left; secondary to cancer    reports that he has never smoked. He has never used smokeless tobacco. He reports that he does not drink alcohol or use illicit drugs. family history includes Asthma in his mother; Barrett's esophagus in his father; Bone cancer in his father; Colon cancer in his maternal uncle; Diabetes in his sister; Esophageal cancer in his father; and Heart disease in his mother and paternal grandfather. Allergies  Allergen Reactions  . Contrast Media (Iodinated Diagnostic Agents)     Was  told not to take d/t pt only having 1 kidney  . Nsaids     Was told not to take d/t pt only having 1 kidney  . Penicillins Hives and Itching  . Sulfonamide Derivatives Hives and Itching  . Codeine Nausea Only        Review of Systems: Denies dysphagia odynophagia chest pain  The remainder of the 10 point ROS is negative except as outlined in H&P   Physical Exam: General appearance  Well developed, in no distress. Eyes- non icteric. HEENT nontraumatic, normocephalic. Mouth no lesions, tongue papillated, no cheilosis. Neck supple without adenopathy, thyroid not enlarged, no carotid bruits, no JVD. Lungs Clear to auscultation bilaterally. Cor normal S1, normal S2, regular rhythm, no murmur,  quiet precordium. Abdomen: Soft abdomen with well-healed surgical scar, post nephrectomy in left upper quadrant. Normoactive bowel sounds. Liver edge at costal margin. No distention. Rectal: Somewhat enlarged prostate. Soft Hemoccult negative stool. Extremities no pedal edema. Skin no lesions. Neurological alert and oriented x 3. Psychological normal mood and affect.  Assessment and Plan:  Problem #1 Change in bowel habits in a 61 year old man who has moderately severe diverticulosis. It may be related to back surgery and pain medications for back surgery. We need to rule out irritable bowel syndrome. He may not be getting enough fiber in his diet. I have advised him to increase fiber content  and gave him samples of Metamucil to take one tablespoon daily. He will also start Bentyl 10 mg every morning for colon spasm. He is interested in a recall colonoscopy. We will recheck the severity of his diverticulosis at the time of his procedure.   01/23/2012 [name]

## 2012-01-24 ENCOUNTER — Encounter: Payer: Self-pay | Admitting: Internal Medicine

## 2012-02-01 ENCOUNTER — Encounter: Payer: BC Managed Care – PPO | Admitting: Internal Medicine

## 2012-02-03 ENCOUNTER — Encounter: Payer: BC Managed Care – PPO | Admitting: Internal Medicine

## 2012-02-03 ENCOUNTER — Encounter: Payer: Self-pay | Admitting: Internal Medicine

## 2012-02-03 ENCOUNTER — Ambulatory Visit (AMBULATORY_SURGERY_CENTER): Payer: BC Managed Care – PPO | Admitting: Internal Medicine

## 2012-02-03 VITALS — BP 125/78 | HR 66 | Temp 95.3°F | Resp 18 | Ht 67.0 in | Wt 170.0 lb

## 2012-02-03 DIAGNOSIS — K5900 Constipation, unspecified: Secondary | ICD-10-CM

## 2012-02-03 DIAGNOSIS — R198 Other specified symptoms and signs involving the digestive system and abdomen: Secondary | ICD-10-CM

## 2012-02-03 DIAGNOSIS — D126 Benign neoplasm of colon, unspecified: Secondary | ICD-10-CM

## 2012-02-03 MED ORDER — POLYETHYLENE GLYCOL 3350 17 GM/SCOOP PO POWD: 9.0000 g | ORAL | Status: AC

## 2012-02-03 MED ORDER — SODIUM CHLORIDE 0.9 % IV SOLN: 500.0000 mL | INTRAVENOUS | Status: DC

## 2012-02-03 NOTE — Progress Notes (Signed)
No complaints noted in the recovery room. Maw  Patient did not experience any of the following events: a burn prior to discharge; a fall within the facility; wrong site/side/patient/procedure/implant event; or a hospital transfer or hospital admission upon discharge from the facility. (G8907) Patient did not have preoperative order for IV antibiotic SSI prophylaxis. (G8918)  

## 2012-02-03 NOTE — Op Note (Signed)
[facility] [address] [place], [place]  [address]  COLONOSCOPY PROCEDURE REPORT  PATIENT:  Jared [name], Jared [name]  MR#:  [id number] BIRTHDATE:  [birth year], 61 yrs. old  GENDER:  male ENDOSCOPIST:  Hedwig Morton. Juanda Chance, MD REF. BY:  Geoffry Paradise, M.D. PROCEDURE DATE:  02/03/2012 PROCEDURE:  Colonoscopy 19147 ASA CLASS:  Class II INDICATIONS:  change in bowel habits, constipation, colorectal cancer screening, average risk prior colon 2001, 2005, hx of renal cell carcinoma MEDICATIONS:   MAC sedation, administered by CRNA, propofol (Diprivan) 450 mg  DESCRIPTION OF PROCEDURE:   After the risks and benefits and of the procedure were explained, informed consent was obtained. Digital rectal exam was performed and revealed no rectal masses. The LB [id number] endoscope was introduced through the anus and advanced to the cecum, which was identified by both the appendix and ileocecal valve.  The quality of the prep was good, using MoviPrep.  The instrument was then slowly withdrawn as the colon was fully examined. <<PROCEDUREIMAGES>>  FINDINGS:  Moderate diverticulosis was found in the sigmoid to descending colon segments (see image1, image6, image5, and image7).  A pedunculated polyp was found. 4 mm pedunculsted polyp found at 35 cm,but could not be located again after the colon telescoped  This was otherwise a normal examination of the colon (see image8, image4, image3, and image2).   Retroflexed views in the rectum revealed no abnormalities.    The scope was then withdrawn from the patient and the procedure completed.  COMPLICATIONS:  None ENDOSCOPIC IMPRESSION: 1) Moderate diverticulosis in the sigmoid to descending colon segments 2) Pedunculated polyp 3) Otherwise normal examination polyp could not be removed RECOMMENDATIONS: symptoms correlate with severity of his diverticulosis- moderately severe add fiber supplements, Miralax  REPEAT EXAM:  In 5 year(s)  for.  ______________________________ Hedwig Morton. Juanda Chance, MD  CC:  n. eSIGNED:   Hedwig Morton. Din Bookwalter at 02/03/2012 09:31 AM  [name], [phone]

## 2012-02-03 NOTE — Patient Instructions (Addendum)
Handouts were given to your care partner on diverticulosis and high fiber diet.  Per Dr. Juanda Chance ad  Metamucil 1 tablespoon daily and Miralax  Prescription was given to the patient. Metamucil is over the counter.  You may resume your prior medications today.  Please call if any questions or concerns.    YOU HAD AN ENDOSCOPIC PROCEDURE TODAY AT THE [facility]: Refer to the procedure report that was given to you for any specific questions about what was found during the examination.  If the procedure report does not answer your questions, please call your gastroenterologist to clarify.  If you requested that your care partner not be given the details of your procedure findings, then the procedure report has been included in a sealed envelope for you to review at your convenience later.  YOU SHOULD EXPECT: Some feelings of bloating in the abdomen. Passage of more gas than usual.  Walking can help get rid of the air that was put into your GI tract during the procedure and reduce the bloating. If you had a lower endoscopy (such as a colonoscopy or flexible sigmoidoscopy) you may notice spotting of blood in your stool or on the toilet paper. If you underwent a bowel prep for your procedure, then you may not have a normal bowel movement for a few days.  DIET: Your first meal following the procedure should be a light meal and then it is ok to progress to your normal diet.  A half-sandwich or bowl of soup is an example of a good first meal.  Heavy or fried foods are harder to digest and may make you feel nauseous or bloated.  Likewise meals heavy in dairy and vegetables can cause extra gas to form and this can also increase the bloating.  Drink plenty of fluids but you should avoid alcoholic beverages for 24 hours.  ACTIVITY: Your care partner should take you home directly after the procedure.  You should plan to take it easy, moving slowly for the rest of the day.  You can resume normal activity the  day after the procedure however you should NOT DRIVE or use heavy machinery for 24 hours (because of the sedation medicines used during the test).    SYMPTOMS TO REPORT IMMEDIATELY: A gastroenterologist can be reached at any hour.  During normal business hours, 8:30 AM to 5:00 PM Monday through Friday, call [phone].  After hours and on weekends, please call the GI answering service at [phone] who will take a message and have the physician on call contact you.   Following lower endoscopy (colonoscopy or flexible sigmoidoscopy):  Excessive amounts of blood in the stool  Significant tenderness or worsening of abdominal pains  Swelling of the abdomen that is new, acute  Fever of 100F or higher    FOLLOW UP: If any biopsies were taken you will be contacted by phone or by letter within the next 1-3 weeks.  Call your gastroenterologist if you have not heard about the biopsies in 3 weeks.  Our staff will call the home number listed on your records the next business day following your procedure to check on you and address any questions or concerns that you may have at that time regarding the information given to you following your procedure. This is a courtesy call and so if there is no answer at the home number and we have not heard from you through the emergency physician on call, we will assume that you have  returned to your regular daily activities without incident.  SIGNATURES/CONFIDENTIALITY: You and/or your care partner have signed paperwork which will be entered into your electronic medical record.  These signatures attest to the fact that that the information above on your After Visit Summary has been reviewed and is understood.  Full responsibility of the confidentiality of this discharge information lies with you and/or your care-partner.

## 2012-02-06 ENCOUNTER — Telehealth: Payer: Self-pay

## 2012-02-06 NOTE — Telephone Encounter (Signed)
  Follow up Call-  Call back number 02/03/2012  Post procedure Call Back phone  # [phone]  Permission to leave phone message Yes     Patient questions:  Do you have a fever, pain , or abdominal swelling? no Pain Score  0 *  Have you tolerated food without any problems? yes  Have you been able to return to your normal activities? yes  Do you have any questions about your discharge instructions: Diet   no Medications  no Follow up visit  no  Do you have questions or concerns about your Care? no  Actions: * If pain score is 4 or above: No action needed, pain <4.

## 2012-02-09 HISTORY — PX: CARDIOVASCULAR STRESS TEST: SHX262

## 2012-03-01 ENCOUNTER — Other Ambulatory Visit: Payer: Self-pay | Admitting: Dermatology

## 2012-05-22 ENCOUNTER — Ambulatory Visit: Payer: BC Managed Care – PPO | Admitting: Pulmonary Disease

## 2012-11-12 DIAGNOSIS — E291 Testicular hypofunction: Secondary | ICD-10-CM | POA: Insufficient documentation

## 2013-01-15 ENCOUNTER — Encounter: Payer: Self-pay | Admitting: *Deleted

## 2013-02-26 ENCOUNTER — Encounter: Payer: Self-pay | Admitting: Internal Medicine

## 2013-02-26 ENCOUNTER — Ambulatory Visit (INDEPENDENT_AMBULATORY_CARE_PROVIDER_SITE_OTHER): Payer: BC Managed Care – PPO | Admitting: Internal Medicine

## 2013-02-26 VITALS — BP 126/80 | HR 64 | Ht 67.0 in | Wt 195.8 lb

## 2013-02-26 DIAGNOSIS — K625 Hemorrhage of anus and rectum: Secondary | ICD-10-CM

## 2013-02-26 DIAGNOSIS — K648 Other hemorrhoids: Secondary | ICD-10-CM

## 2013-02-26 MED ORDER — HYDROCORTISONE ACETATE 25 MG RE SUPP: 25.0000 mg | Freq: Every day | RECTAL | Status: DC

## 2013-02-26 NOTE — Patient Instructions (Addendum)
We have sent the following medications to your pharmacy for you to pick up at your convenience: Anusol suppositories to use x 6 days at bedtime then use other 6 if needed.  Please schedule a appointment with Dr. Juanda Chance for 2 month follow up.  cc: Geoffry Paradise, MD

## 2013-02-26 NOTE — Progress Notes (Signed)
Jared [name] 06/09/1951 MRN [id number]        History of Present Illness:  This is a 62 year old white male with Hemoccult positive stool on home test by Dr.Aronson. He sees small amounts of bright red blood on the stool  about twice a week. He feels irritation and itching in the rectal area. We have seen him in the past for  gastroesophageal reflux, positive family history of gastric cancer in his father and for colorectal screening. He had moderately severe diverticulosis on colonoscopy in 2001, 2005 and most recently in May 2013. His maternal uncle had colon cancer. On last colonoscopy I saw a polyp in the sigmoid colon but could not find it again on the way back. It was a rather small polyp and it is possible that it represented a fold which flattened out with insufflation..   Past Medical History  Diagnosis Date  . Asthma   . Allergic rhinitis   . Heart attack 2009  . Renal cell carcinoma 1997    Left kidney  . GERD (gastroesophageal reflux disease)   . IBS (irritable bowel syndrome)   . Diverticulosis   . History of esophageal stricture   . Internal hemorrhoids   . Depression   . Osteoarthritis   . Peripheral neuropathy    Past Surgical History  Procedure Laterality Date  . Back surgery  1992, 2013  . Neck surgery  2005  . Coronary stent placement    . Femur fracture surgery Left 1995  . Total knee arthroplasty Left 2005  . Carpal tunnel release Bilateral   . Nephrectomy Left     secondary to cancer    reports that he has never smoked. He has never used smokeless tobacco. He reports that he does not drink alcohol or use illicit drugs. family history includes Asthma in his mother; Barrett's esophagus in his father; Bone cancer in his father; Colon cancer in his maternal uncle; Diabetes in his sister; Esophageal cancer in his father; and Heart disease in his mother and paternal grandfather. Allergies  Allergen Reactions  . Contrast Media (Iodinated Diagnostic  Agents)     Was told not to take d/t pt only having 1 kidney  . Nsaids     Was told not to take d/t pt only having 1 kidney  . Penicillins Hives and Itching  . Sulfonamide Derivatives Hives and Itching  . Codeine Nausea Only        Review of Systems:Negative for abdominal pain. He has normal bowel habits  The remainder of the 10 point ROS is negative except as outlined in H&P   Physical Exam: General appearance  Well developed, in no distress. Eyes- non icteric. HEENT nontraumatic, normocephalic. Mouth no lesions, tongue papillated, no cheilosis. Neck supple without adenopathy, thyroid not enlarged, no carotid bruits, no JVD. Lungs Clear to auscultation bilaterally. Cor normal S1, normal S2, regular rhythm, no murmur,  quiet precordium. Abdomen: Mildly protuberant. Soft. Nontender. Left nephrectomy scar left upper quadrant. Normoactive bowel sounds.  Rectal:And anoscopic exam reveals normal perianal area. Normal rectal sphincter tone. Small first-grade hemorrhoids with edema but no active bleeding. There are superficial fissure created by insertion of the endoscope causing break in the skin.and tiny bleeding spots. There was no stool was no prolapse of the hemorrhoids  Extremities no pedal edema. Skin no lesions. Neurological alert and oriented x 3. Psychological normal mood and affect.  Assessment and Plan:  62 year old white male who is up-to-date on his colonoscopy who is having low  volume hematochezia likely from symptomatic first-grade hemorrhoids. I was able to reproduce some superficial bleeding from anal canal as a result of inserting the endoscope in the rectum. He has  tenderness and irritation on the exam. There was no evidence of proctitis. I think his bleeding is ano- rectal . He will start Anusol-HC suppositories at bedtime for one week and he will use samples of theCalmoseptine ointment twice a day for external rectal irritation. If the bleeding continues, he may use  the Anusol-HC suppositories again and if the bleeding still continues I will see him in the next 2 months

## 2013-06-25 ENCOUNTER — Encounter: Payer: Self-pay | Admitting: Cardiology

## 2013-06-25 ENCOUNTER — Ambulatory Visit (INDEPENDENT_AMBULATORY_CARE_PROVIDER_SITE_OTHER): Payer: BC Managed Care – PPO | Admitting: Cardiology

## 2013-06-25 VITALS — BP 138/92 | HR 72 | Ht 68.0 in | Wt 191.5 lb

## 2013-06-25 DIAGNOSIS — F32A Depression, unspecified: Secondary | ICD-10-CM

## 2013-06-25 DIAGNOSIS — R079 Chest pain, unspecified: Secondary | ICD-10-CM | POA: Insufficient documentation

## 2013-06-25 DIAGNOSIS — F329 Major depressive disorder, single episode, unspecified: Secondary | ICD-10-CM

## 2013-06-25 DIAGNOSIS — R61 Generalized hyperhidrosis: Secondary | ICD-10-CM

## 2013-06-25 DIAGNOSIS — I2510 Atherosclerotic heart disease of native coronary artery without angina pectoris: Secondary | ICD-10-CM

## 2013-06-25 DIAGNOSIS — R002 Palpitations: Secondary | ICD-10-CM

## 2013-06-25 DIAGNOSIS — J45909 Unspecified asthma, uncomplicated: Secondary | ICD-10-CM

## 2013-06-25 LAB — TSH: TSH: 4.925 u[IU]/mL — ABNORMAL HIGH (ref 0.350–4.500)

## 2013-06-25 LAB — BASIC METABOLIC PANEL
BUN: 30 mg/dL — ABNORMAL HIGH (ref 6–23)
CO2: 25 mEq/L (ref 19–32)
Calcium: 9.9 mg/dL (ref 8.4–10.5)
Chloride: 104 mEq/L (ref 96–112)
Creat: 1.41 mg/dL — ABNORMAL HIGH (ref 0.50–1.35)
Glucose, Bld: 81 mg/dL (ref 70–99)
Potassium: 4.7 mEq/L (ref 3.5–5.3)
Sodium: 141 mEq/L (ref 135–145)

## 2013-06-25 MED ORDER — ISOSORBIDE MONONITRATE ER 30 MG PO TB24: 30.0000 mg | ORAL_TABLET | Freq: Every day | ORAL | Status: DC

## 2013-06-25 MED ORDER — NITROGLYCERIN 0.4 MG SL SUBL: 0.4000 mg | SUBLINGUAL_TABLET | SUBLINGUAL | Status: DC | PRN

## 2013-06-25 NOTE — Assessment & Plan Note (Signed)
Hx of stent to RCA, Last cath with patent stent and mild LM disease.

## 2013-06-25 NOTE — Patient Instructions (Addendum)
Have stress done.  Have lab work done today.  We refilled your NTG  We started Isosorbide mononitrate to help prevent pain.  Call if symptoms increase.  Follow up with Dr. Allyson Sabal for results.
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
## 2013-06-25 NOTE — Assessment & Plan Note (Signed)
With hx of CAD with stent to RCA will do stress myoview to eval for ischemia as cause for chest and arm pain.  I added Imdur 30 mg daily to medications as well.

## 2013-06-26 ENCOUNTER — Ambulatory Visit (HOSPITAL_COMMUNITY)
Admission: RE | Admit: 2013-06-26 | Discharge: 2013-06-26 | Disposition: A | Discharge status: Home / Self Care | Payer: BC Managed Care – PPO | Source: Ambulatory Visit | Attending: Cardiovascular Disease | Admitting: Cardiovascular Disease

## 2013-06-26 ENCOUNTER — Encounter: Payer: Self-pay | Admitting: Cardiology

## 2013-06-26 DIAGNOSIS — R5381 Other malaise: Secondary | ICD-10-CM | POA: Insufficient documentation

## 2013-06-26 DIAGNOSIS — R0989 Other specified symptoms and signs involving the circulatory and respiratory systems: Secondary | ICD-10-CM | POA: Insufficient documentation

## 2013-06-26 DIAGNOSIS — I252 Old myocardial infarction: Secondary | ICD-10-CM | POA: Insufficient documentation

## 2013-06-26 DIAGNOSIS — R42 Dizziness and giddiness: Secondary | ICD-10-CM | POA: Insufficient documentation

## 2013-06-26 DIAGNOSIS — R002 Palpitations: Secondary | ICD-10-CM | POA: Insufficient documentation

## 2013-06-26 DIAGNOSIS — F32A Depression, unspecified: Secondary | ICD-10-CM

## 2013-06-26 DIAGNOSIS — R0609 Other forms of dyspnea: Secondary | ICD-10-CM | POA: Insufficient documentation

## 2013-06-26 DIAGNOSIS — J45909 Unspecified asthma, uncomplicated: Secondary | ICD-10-CM | POA: Insufficient documentation

## 2013-06-26 DIAGNOSIS — R079 Chest pain, unspecified: Secondary | ICD-10-CM

## 2013-06-26 DIAGNOSIS — I2510 Atherosclerotic heart disease of native coronary artery without angina pectoris: Secondary | ICD-10-CM | POA: Insufficient documentation

## 2013-06-26 DIAGNOSIS — F329 Major depressive disorder, single episode, unspecified: Secondary | ICD-10-CM | POA: Insufficient documentation

## 2013-06-26 DIAGNOSIS — R61 Generalized hyperhidrosis: Secondary | ICD-10-CM | POA: Insufficient documentation

## 2013-06-26 DIAGNOSIS — Z9861 Coronary angioplasty status: Secondary | ICD-10-CM | POA: Insufficient documentation

## 2013-06-26 HISTORY — DX: Depression, unspecified: F32.A

## 2013-06-26 MED ORDER — TECHNETIUM TC 99M SESTAMIBI GENERIC - CARDIOLITE: 10.0000 | Freq: Once | INTRAVENOUS | Status: AC | PRN

## 2013-06-26 MED ORDER — TECHNETIUM TC 99M SESTAMIBI GENERIC - CARDIOLITE: 30.0000 | Freq: Once | INTRAVENOUS | Status: AC | PRN

## 2013-06-26 NOTE — Procedures (Addendum)
[facility] [address] [place] [address] [place] [address] [phone]  Cardiology Nuclear Med Study  Jared [name] is a 62 y.o. male     MRN : [id number]     DOB: [birth year]  Procedure Date: 06/26/2013  Nuclear Med Background Indication for Stress Test:  Stent Patency History:  Asthma and CAD;MI--2009;STENT/PTCA Cardiac Risk Factors: Lipids and Overweight  Symptoms:  Chest Pain, Dizziness, DOE, Fatigue, Light-Headedness, Palpitations and NIGHT SWEATS   Nuclear Pre-Procedure Caffeine/Decaff Intake:  7:00pm NPO After: 5:00am   IV Site: R Forearm  IV 0.9% NS with Angio Cath:  22g  Chest Size (in):  42"  IV Started by: Emmit Pomfret, RN  Height: 5\' 7"  (1.702 m)  Cup Size: n/a  BMI:  Body mass index is 29.91 kg/(m^2). Weight:  191 lb (86.637 kg)   Tech Comments:  N/A    Nuclear Med Study 1 or 2 day study: 1 day  Stress Test Type:  Stress  Order Authorizing Provider:  Nanetta Batty, MD   Resting Radionuclide: Technetium 73m Sestamibi  Resting Radionuclide Dose: 11.0 mCi   Stress Radionuclide:  Technetium 29m Sestamibi  Stress Radionuclide Dose: 31.0 mCi           Stress Protocol Rest HR: 76 Stress HR: 150  Rest BP: 128/96 Stress BP: 220/105  Exercise Time (min): 9 METS: 10.1   Predicted Max HR: 156 bpm % Max HR: 96.15 bpm Rate Pressure Product: 21308  Dose of Adenosine (mg):  n/a Dose of Lexiscan: n/a mg  Dose of Atropine (mg): n/a Dose of Dobutamine: n/a mcg/kg/min (at max HR)  Stress Test Technologist: Esperanza Sheets, CCT Nuclear Technologist: Koren Shiver, CNMT   Rest Procedure:  Myocardial perfusion imaging was performed at rest 45 minutes following the intravenous administration of Technetium 55m Sestamibi. Stress Procedure:  The patient performed treadmill exercise using a Bruce  Protocol for 9 minutes. The patient stopped due to SOB and Leg Discomfort and denied any chest pain.  There were no significant ST-T  wave changes.  Technetium 45m Sestamibi was injected at peak exercise and myocardial perfusion imaging was performed after a brief delay.  Transient Ischemic Dilatation (Normal <1.22):  0.92 Lung/Heart Ratio (Normal <0.45):  0.31 QGS EDV:  64 ml QGS ESV:  27 ml LV Ejection Fraction: 57%  Rest ECG: NSR - Normal EKG  Stress ECG: Insignificant upsloping ST segment depression.  QPS Raw Data Images:  Normal; no motion artifact; normal heart/lung ratio. Stress Images:  Normal homogeneous uptake in all areas of the myocardium. Rest Images:  There is decreased uptake in the septum. Subtraction (SDS):  No evidence of ischemia.  Impression Exercise Capacity:  Good exercise capacity. BP Response:  Hypertensive blood pressure response. Clinical Symptoms:  There is dyspnea. ECG Impression:  Insignificant upsloping ST segment depression. Comparison with Prior Nuclear Study: No significant change from previous study  Overall Impression:  Normal stress nuclear study.  LV Wall Motion:  NL LV Function; NL Wall Motion; EF 57%  Chrystie Nose, MD, [facility] Board Certified in Nuclear Cardiology Attending Cardiologist [facility]  Chrystie Nose, MD  06/26/2013 1:22 PM

## 2013-06-26 NOTE — Assessment & Plan Note (Signed)
Recent exacerbation treated by PCP.  Hesitant to add BB at this time.  Will do stress test first and add as needed.

## 2013-06-26 NOTE — Assessment & Plan Note (Signed)
We'll check TSH to ensure night sweats are not related to thyroid.
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
## 2013-06-27 ENCOUNTER — Encounter (HOSPITAL_COMMUNITY): Payer: BC Managed Care – PPO

## 2013-06-28 ENCOUNTER — Encounter: Payer: Self-pay | Admitting: *Deleted

## 2013-07-01 ENCOUNTER — Telehealth: Payer: Self-pay | Admitting: Cardiovascular Disease

## 2013-07-01 NOTE — Telephone Encounter (Signed)
Returned patient's call. He asked if the his fatigue could be related to his abnormal thyroid. I stated to him it's possible, however this would be up to his PCP to determine and diagnose. Patient agreed and requested a copy mailed to him to carry to his appointment with his PCP.

## 2013-07-01 NOTE — Progress Notes (Signed)
Pt. Called and informed of need for appt with PCP; pt. Stated he had an appt on the 23rd of this month and would make sure PCP  Goes over this information

## 2013-07-01 NOTE — Telephone Encounter (Signed)
Spoke with someone this morning regarding results of his labs  He has some questions  Please call

## 2013-07-08 ENCOUNTER — Encounter: Payer: Self-pay | Admitting: Cardiovascular Disease

## 2013-07-08 ENCOUNTER — Ambulatory Visit (INDEPENDENT_AMBULATORY_CARE_PROVIDER_SITE_OTHER): Payer: BC Managed Care – PPO | Admitting: Cardiovascular Disease

## 2013-07-08 VITALS — BP 160/90 | HR 80 | Ht 68.0 in | Wt 193.2 lb

## 2013-07-08 DIAGNOSIS — R079 Chest pain, unspecified: Secondary | ICD-10-CM

## 2013-07-08 DIAGNOSIS — I10 Essential (primary) hypertension: Secondary | ICD-10-CM | POA: Insufficient documentation

## 2013-07-08 DIAGNOSIS — E785 Hyperlipidemia, unspecified: Secondary | ICD-10-CM

## 2013-07-08 NOTE — Assessment & Plan Note (Signed)
Hyperlipidemia statin intolerant on Zetia and gemfibrozil. Recent laboratory form 01/06/13 revealed a glucose of 217, LDL 96 HDL of 42. Ventricles are level was 393.
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
## 2013-07-08 NOTE — Patient Instructions (Signed)
Your physician wants you to follow-up in: 6 months with Laura Ingold NP and 1 year with Dr Berry.  You will receive a reminder letter in the mail two months in advance. If you don't receive a letter, please call our office to schedule the follow-up appointment.  

## 2013-07-08 NOTE — Assessment & Plan Note (Signed)
Under borderline control on current medications

## 2013-07-08 NOTE — Assessment & Plan Note (Signed)
Patient has a history of remote RCA stenting back in 2000. His left cardiac catheterization was performed by Dr. Caprice Kluver in 2011 revealing a 30% distal left main which apparently had improved since his prior cardiac catheterization.he has been under a lot of stress. A Myoview stress test performed last week was completely normal.

## 2013-08-20 ENCOUNTER — Other Ambulatory Visit: Payer: Self-pay | Admitting: Dermatology

## 2013-09-19 DIAGNOSIS — Z8781 Personal history of (healed) traumatic fracture: Secondary | ICD-10-CM

## 2013-09-19 HISTORY — DX: Personal history of (healed) traumatic fracture: Z87.81

## 2013-09-24 ENCOUNTER — Encounter: Payer: Self-pay | Admitting: Cardiology

## 2013-11-16 ENCOUNTER — Emergency Department (INDEPENDENT_AMBULATORY_CARE_PROVIDER_SITE_OTHER)
Admission: EM | Admit: 2013-11-16 | Discharge: 2013-11-16 | Disposition: A | Discharge status: Home / Self Care | Payer: 59 | Source: Home / Self Care | Attending: Emergency Medicine | Admitting: Emergency Medicine

## 2013-11-16 ENCOUNTER — Encounter (HOSPITAL_COMMUNITY): Payer: Self-pay | Admitting: Emergency Medicine

## 2013-11-16 DIAGNOSIS — H8110 Benign paroxysmal vertigo, unspecified ear: Secondary | ICD-10-CM

## 2013-11-16 LAB — POCT I-STAT, CHEM 8
BUN: 31 mg/dL — ABNORMAL HIGH (ref 6–23)
Calcium, Ion: 1.2 mmol/L (ref 1.13–1.30)
Chloride: 103 mEq/L (ref 96–112)
Creatinine, Ser: 1.6 mg/dL — ABNORMAL HIGH (ref 0.50–1.35)
Glucose, Bld: 91 mg/dL (ref 70–99)
HCT: 53 % — ABNORMAL HIGH (ref 39.0–52.0)
Hemoglobin: 18 g/dL — ABNORMAL HIGH (ref 13.0–17.0)
Potassium: 4.3 mEq/L (ref 3.7–5.3)
Sodium: 143 mEq/L (ref 137–147)
TCO2: 28 mmol/L (ref 0–100)

## 2013-11-16 MED ORDER — MECLIZINE HCL 25 MG PO TABS: 25.0000 mg | ORAL_TABLET | Freq: Three times a day (TID) | ORAL | Status: DC | PRN

## 2013-11-16 NOTE — Discharge Instructions (Signed)
You have been diagnosed with benign positional vertigo.  The cause of this is a displaced particle or crystal in the inner ear.  This can be cured by doing the exercise described below, called the Epley exercise developed by Dr. Epley. ° °Do this exercise three times daily until you are able to do it for 24 hours without getting dizzy.  Sleep with your head elevated and try to avoid bending over.  If the dizziness is not better in 7 days, return here, see your primary care doctor or ENT doctor. ° ° ° ° ° °

## 2013-11-16 NOTE — ED Notes (Signed)
Pt. Stated, Im feeling dizziness started yesterday after i worked out at [employer] for about 2 hours.  Its been ongoing since yesterday even during the night. Pt had no drift, alert and oriented x 4. Denies any slurred speech, comes and goes with numbness in hands.

## 2013-11-16 NOTE — ED Provider Notes (Signed)
Chief Complaint   Chief Complaint  Patient presents with  . Dizziness    History of Present Illness   Jared [name] is a 63 year old male who has a two-day history of feeling dizzy and lightheaded. He describes a sensation of the room spinning. This occurs when he lies flat but also when sitting and walking. It occurs with change in position. This began after a gym workout. He denies any syncope or presyncope. He's had no difficulty hearing or ringing in the ears. The fingertips of both hands felt numb and tingly. He's had some triggering of the left middle finger. Also for the past 2 days she's had intermittent right-sided headaches lasting seconds at a time. He denies any chest pain, tightness, pressure, shortness of breath, palpitations, diaphoresis, or nausea. He's had no vomiting or diarrhea. No bowel pain. He denies any diplopia or blurry vision. He's had no difficulty with speech or swallowing, ambulation, weakness of the muscles, or incoordination.  Review of Systems   Other than noted above, the patient denies any of the following symptoms: Systemic:  No fever, chills, fatigue, or weight loss. Eye:  No blurred vision, visual change or diplopia. ENT:  No ear pain, tinnitus, hearing loss, nasal congestion, or rhinorrhea. Cardiac:  No chest pain, dyspnea, palpitations or syncope. Neuro:  No headache, paresthesias, weakness, trouble with speech, coordination or ambulation.  [place]   Past medical history, family history, social history, meds, and allergies were reviewed.  He has no medication allergies. Current meds include albuterol, Abilify, aspirin, Cymbalta, Zetia, fish oil, Lopid, Imdur, Nitrostat, Xolair, Protonix, and Rapaflo. He has a history of an MI in the past.  Physical Examination     Vital signs:  There were no vitals taken for this visit. There were no vitals filed for this visit. General:  Alert, oriented times 3, in no distress. Eye:  PERRL, full EOM, no  nystagmus. ENT:  TMs and canals normal.  Nasal mucosa normal.  Pharynx clear. Neck:  No adenopathy, tenderness, or mass.  Thyroid normal.  No carotid bruit. Lungs:  Breath sounds clear and equal bilaterally.  No wheezes, rales or rhonchi. Heart:  Regular rhythm.  No gallops, murmers, or rubs. Neuro:  Alert and oriented times 3.  Cranial nerves intact.  No pronator drift.  Finger to nose normal.  No focal weakness.  Sensation intact to light touch.  Romberg's sign negative, gait normal.  Able to do tandem gait well.  Dix-Hallpike maneuver was positive with the left ear down.   Labs   Results for orders placed during the hospital encounter of 11/16/13  POCT I-STAT, CHEM 8      Result Value Ref Range   Sodium 143  137 - 147 mEq/L   Potassium 4.3  3.7 - 5.3 mEq/L   Chloride 103  96 - 112 mEq/L   BUN 31 (*) 6 - 23 mg/dL   Creatinine, Ser 1.60 (*) 0.50 - 1.35 mg/dL   Glucose, Bld 91  70 - 99 mg/dL   Calcium, Ion 1.20  1.13 - 1.30 mmol/L   TCO2 28  0 - 100 mmol/L   Hemoglobin 18.0 (*) 13.0 - 17.0 g/dL   HCT 53.0 (*) 39.0 - 52.0 %    EKG Results    Date: 11/16/2013  Rate: 73  Rhythm: normal sinus rhythm  QRS Axis: normal  Intervals: normal  ST/T Wave abnormalities: normal  Conduction Disutrbances:none  Narrative Interpretation: Normal sinus rhythm, nonspecific T-wave abnormality, but no change since  a prior EKG from 2013. Old EKG Reviewed: unchanged  Assessment   The encounter diagnosis was Benign positional vertigo.  Probably due to canalithiasis.  Plan     1.  Meds:  The following meds were prescribed:   Discharge Medication List as of 11/16/2013  1:46 PM    START taking these medications   Details  meclizine (ANTIVERT) 25 MG tablet Take 1 tablet (25 mg total) by mouth 3 (three) times daily as needed for dizziness., Starting 11/16/2013, Until Discontinued, Normal        2.  Patient Education/Counseling:  The patient was given appropriate handouts, self care instructions,  and instructed in symptomatic relief.  Instructed in apple he exercises.  3.  Follow up:  The patient was told to follow up here if no better in 3 to 4 days, or sooner if becoming worse in any way, and given some red flag symptoms such as new neurological symptoms, chest pain, or syncope which would prompt immediate return.  Followup with ENT if no better in a week. He seen Dr. Radene Journey in the past and suggest he followup with him if needed.     Harden Mo, MD 11/16/13 [phone]

## 2013-12-24 ENCOUNTER — Encounter: Payer: Self-pay | Admitting: *Deleted

## 2013-12-25 ENCOUNTER — Encounter (INDEPENDENT_AMBULATORY_CARE_PROVIDER_SITE_OTHER): Payer: Self-pay

## 2013-12-25 ENCOUNTER — Encounter: Payer: Self-pay | Admitting: Neurology

## 2013-12-25 ENCOUNTER — Ambulatory Visit (INDEPENDENT_AMBULATORY_CARE_PROVIDER_SITE_OTHER): Payer: Medicare Other | Admitting: Neurology

## 2013-12-25 VITALS — BP 113/81 | HR 78 | Resp 18 | Ht 68.0 in | Wt 195.0 lb

## 2013-12-25 DIAGNOSIS — R0989 Other specified symptoms and signs involving the circulatory and respiratory systems: Secondary | ICD-10-CM

## 2013-12-25 DIAGNOSIS — R0609 Other forms of dyspnea: Secondary | ICD-10-CM

## 2013-12-25 DIAGNOSIS — F5105 Insomnia due to other mental disorder: Secondary | ICD-10-CM

## 2013-12-25 DIAGNOSIS — G4750 Parasomnia, unspecified: Secondary | ICD-10-CM | POA: Insufficient documentation

## 2013-12-25 DIAGNOSIS — G478 Other sleep disorders: Secondary | ICD-10-CM | POA: Insufficient documentation

## 2013-12-25 DIAGNOSIS — R0683 Snoring: Secondary | ICD-10-CM

## 2013-12-25 DIAGNOSIS — F518 Other sleep disorders not due to a substance or known physiological condition: Secondary | ICD-10-CM

## 2013-12-25 DIAGNOSIS — F489 Nonpsychotic mental disorder, unspecified: Secondary | ICD-10-CM

## 2013-12-25 HISTORY — DX: Snoring: R06.83

## 2013-12-25 HISTORY — DX: Parasomnia, unspecified: G47.50

## 2013-12-25 MED ORDER — SUVOREXANT 10 MG PO TABS: 10.0000 mg | ORAL_TABLET | Freq: Every evening | ORAL | Status: DC

## 2013-12-25 NOTE — Patient Instructions (Signed)
Insomnia Insomnia is frequent trouble falling and/or staying asleep. Insomnia can be a long term problem or a short term problem. Both are common. Insomnia can be a short term problem when the wakefulness is related to a certain stress or worry. Long term insomnia is often related to ongoing stress during waking hours and/or poor sleeping habits. Overtime, sleep deprivation itself can make the problem worse. Every little thing feels more severe because you are overtired and your ability to cope is decreased. CAUSES   Stress, anxiety, and depression.  Poor sleeping habits.  Distractions such as TV in the bedroom.  Naps close to bedtime.  Engaging in emotionally charged conversations before bed.  Technical reading before sleep.  Alcohol and other sedatives. They may make the problem worse. They can hurt normal sleep patterns and normal dream activity.  Stimulants such as caffeine for several hours prior to bedtime.  Pain syndromes and shortness of breath can cause insomnia.  Exercise late at night.  Changing time zones may cause sleeping problems (jet lag). It is sometimes helpful to have someone observe your sleeping patterns. They should look for periods of not breathing during the night (sleep apnea). They should also look to see how long those periods last. If you live alone or observers are uncertain, you can also be observed at a sleep clinic where your sleep patterns will be professionally monitored. Sleep apnea requires a checkup and treatment. Give your caregivers your medical history. Give your caregivers observations your family has made about your sleep.  SYMPTOMS   Not feeling rested in the morning.  Anxiety and restlessness at bedtime.  Difficulty falling and staying asleep. TREATMENT   Your caregiver may prescribe treatment for an underlying medical disorders. Your caregiver can give advice or help if you are using alcohol or other drugs for self-medication. Treatment  of underlying problems will usually eliminate insomnia problems.  Medications can be prescribed for short time use. They are generally not recommended for lengthy use.  Over-the-counter sleep medicines are not recommended for lengthy use. They can be habit forming.  You can promote easier sleeping by making lifestyle changes such as:  Using relaxation techniques that help with breathing and reduce muscle tension.  Exercising earlier in the day.  Changing your diet and the time of your last meal. No night time snacks.  Establish a regular time to go to bed.  Counseling can help with stressful problems and worry.  Soothing music and white noise may be helpful if there are background noises you cannot remove.  Stop tedious detailed work at least one hour before bedtime. HOME CARE INSTRUCTIONS   Keep a diary. Inform your caregiver about your progress. This includes any medication side effects. See your caregiver regularly. Take note of:  Times when you are asleep.  Times when you are awake during the night.  The quality of your sleep.  How you feel the next day. This information will help your caregiver care for you.  Get out of bed if you are still awake after 15 minutes. Read or do some quiet activity. Keep the lights down. Wait until you feel sleepy and go back to bed.  Keep regular sleeping and waking hours. Avoid naps.  Exercise regularly.  Avoid distractions at bedtime. Distractions include watching television or engaging in any intense or detailed activity like attempting to balance the household checkbook.  Develop a bedtime ritual. Keep a familiar routine of bathing, brushing your teeth, climbing into bed at the same   time each night, listening to soothing music. Routines increase the success of falling to sleep faster.  Use relaxation techniques. This can be using breathing and muscle tension release routines. It can also include visualizing peaceful scenes. You can  also help control troubling or intruding thoughts by keeping your mind occupied with boring or repetitive thoughts like the old concept of counting sheep. You can make it more creative like imagining planting one beautiful flower after another in your backyard garden.  During your day, work to eliminate stress. When this is not possible use some of the previous suggestions to help reduce the anxiety that accompanies stressful situations. MAKE SURE YOU:   Understand these instructions.  Will watch your condition.  Will get help right away if you are not doing well or get worse. Document Released: 09/02/2000 Document Revised: 11/28/2011 Document Reviewed: 10/03/2007 [facility] Patient Information 2014 [place]. Post-traumatic Stress You have post-traumatic stress disorder (PTSD). This condition causes many different symptoms including: emotional outbursts, anxiety, sleeping problems, social withdrawal, and drug abuse. PTSD often follows a particularly traumatic event such as war, or natural disasters like hurricanes, earthquakes, or floods. It can also be seen after personal traumas such as accidents, rape, or the death of someone you love. Symptoms may be delayed for days or even years. Emotional numbing and the inability to feel your emotions, may be the earliest sign. Periods of agitation, aggression, and inability to perform ordinary tasks are common with PTSD. Nightmares and daytime memories of the trauma often bring on uncontrolled symptoms. Sufferers typically startle easily and avoid reminders of the trauma. Panic attacks, feelings of extreme guilt, and blackouts are often reported. Treatment is very helpful, especially group therapy. Healing happens when emotional traumas are shared with others who have a sympathetic ear. The [facility] have helped over 185,000 veterans with this problem. Medication is also very effective. The symptoms can become chronic and  lifelong, so it is important to get help. Call your caregiver or a counselor who deals with this type of problem for further assistance. Document Released: 10/13/2004 Document Revised: 11/28/2011 Document Reviewed: 09/05/2005 [facility] Patient Information 2014 [place].
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
## 2013-12-26 ENCOUNTER — Telehealth: Payer: Self-pay | Admitting: *Deleted

## 2013-12-26 NOTE — Telephone Encounter (Signed)
ERROR

## 2014-01-02 ENCOUNTER — Other Ambulatory Visit: Payer: Self-pay | Admitting: Neurology

## 2014-01-14 ENCOUNTER — Ambulatory Visit (INDEPENDENT_AMBULATORY_CARE_PROVIDER_SITE_OTHER): Payer: Medicare Other | Admitting: Neurology

## 2014-01-14 DIAGNOSIS — G4750 Parasomnia, unspecified: Secondary | ICD-10-CM

## 2014-01-14 DIAGNOSIS — G4733 Obstructive sleep apnea (adult) (pediatric): Secondary | ICD-10-CM

## 2014-01-14 DIAGNOSIS — R0683 Snoring: Secondary | ICD-10-CM

## 2014-01-14 DIAGNOSIS — G478 Other sleep disorders: Secondary | ICD-10-CM

## 2014-01-24 ENCOUNTER — Other Ambulatory Visit: Payer: Self-pay | Admitting: Internal Medicine

## 2014-01-24 DIAGNOSIS — J984 Other disorders of lung: Secondary | ICD-10-CM

## 2014-01-29 ENCOUNTER — Ambulatory Visit
Admission: RE | Admit: 2014-01-29 | Discharge: 2014-01-29 | Disposition: A | Discharge status: Home / Self Care | Payer: 59 | Source: Ambulatory Visit | Attending: Internal Medicine | Admitting: Internal Medicine

## 2014-01-29 DIAGNOSIS — J984 Other disorders of lung: Secondary | ICD-10-CM

## 2014-02-04 ENCOUNTER — Telehealth: Payer: Self-pay | Admitting: Neurology

## 2014-02-04 ENCOUNTER — Encounter: Payer: Self-pay | Admitting: *Deleted

## 2014-02-04 DIAGNOSIS — G4733 Obstructive sleep apnea (adult) (pediatric): Secondary | ICD-10-CM

## 2014-02-04 NOTE — Telephone Encounter (Signed)
I called and left a message for the patient about his recent sleep study results. I informed the patient that the study revealed mild complex sleep apnea and that Dr. Brett Fairy recommend repeat study for the proper titration and mask fitting. I will fax a copy of the report to Dr. Delfino Lovett Aronson's office and mail a copy to the patient.

## 2014-02-06 ENCOUNTER — Ambulatory Visit (INDEPENDENT_AMBULATORY_CARE_PROVIDER_SITE_OTHER): Payer: Medicare Other | Admitting: Neurology

## 2014-02-06 DIAGNOSIS — G4733 Obstructive sleep apnea (adult) (pediatric): Secondary | ICD-10-CM

## 2014-02-17 ENCOUNTER — Other Ambulatory Visit: Payer: Self-pay | Admitting: *Deleted

## 2014-02-17 MED ORDER — ISOSORBIDE MONONITRATE ER 30 MG PO TB24: 30.0000 mg | ORAL_TABLET | Freq: Every day | ORAL | Status: DC

## 2014-02-17 NOTE — Telephone Encounter (Signed)
Rx was sent to pharmacy electronically. 

## 2014-02-18 ENCOUNTER — Encounter: Payer: Self-pay | Admitting: *Deleted

## 2014-02-18 ENCOUNTER — Telehealth: Payer: Self-pay | Admitting: Neurology

## 2014-02-18 DIAGNOSIS — G4733 Obstructive sleep apnea (adult) (pediatric): Secondary | ICD-10-CM

## 2014-02-18 NOTE — Telephone Encounter (Signed)
I called and spoke with the patient about his recent CPAP titration study results. I informed the patient that he did well on CPAP during the night of his study with significant improvement of his respiratory events; therefore Dr. Brett Fairy recommend starting CPAP therapy at home, I will send the order to [place]. I will fax a copy of the reort to Dr. Jacquiline Doe office and mail a copy to the patient along with a follow up instruction letter.

## 2014-04-04 ENCOUNTER — Other Ambulatory Visit: Payer: Self-pay | Admitting: Dermatology

## 2014-04-11 ENCOUNTER — Encounter: Payer: Self-pay | Admitting: Neurology

## 2014-04-12 ENCOUNTER — Emergency Department (HOSPITAL_COMMUNITY)
Admission: EM | Admit: 2014-04-12 | Discharge: 2014-04-12 | Disposition: A | Discharge status: Home / Self Care | Payer: Medicare Other | Attending: Emergency Medicine | Admitting: Emergency Medicine

## 2014-04-12 ENCOUNTER — Emergency Department (HOSPITAL_COMMUNITY): Payer: Medicare Other

## 2014-04-12 ENCOUNTER — Encounter (HOSPITAL_COMMUNITY): Payer: Self-pay | Admitting: Emergency Medicine

## 2014-04-12 DIAGNOSIS — M5412 Radiculopathy, cervical region: Secondary | ICD-10-CM | POA: Diagnosis not present

## 2014-04-12 DIAGNOSIS — I2510 Atherosclerotic heart disease of native coronary artery without angina pectoris: Secondary | ICD-10-CM | POA: Insufficient documentation

## 2014-04-12 DIAGNOSIS — Z9889 Other specified postprocedural states: Secondary | ICD-10-CM | POA: Diagnosis not present

## 2014-04-12 DIAGNOSIS — J45909 Unspecified asthma, uncomplicated: Secondary | ICD-10-CM | POA: Insufficient documentation

## 2014-04-12 DIAGNOSIS — M79609 Pain in unspecified limb: Secondary | ICD-10-CM | POA: Diagnosis present

## 2014-04-12 DIAGNOSIS — F3289 Other specified depressive episodes: Secondary | ICD-10-CM | POA: Diagnosis not present

## 2014-04-12 DIAGNOSIS — F329 Major depressive disorder, single episode, unspecified: Secondary | ICD-10-CM | POA: Diagnosis not present

## 2014-04-12 DIAGNOSIS — I252 Old myocardial infarction: Secondary | ICD-10-CM | POA: Insufficient documentation

## 2014-04-12 DIAGNOSIS — M1990 Unspecified osteoarthritis, unspecified site: Secondary | ICD-10-CM | POA: Insufficient documentation

## 2014-04-12 DIAGNOSIS — I10 Essential (primary) hypertension: Secondary | ICD-10-CM | POA: Insufficient documentation

## 2014-04-12 DIAGNOSIS — Z9861 Coronary angioplasty status: Secondary | ICD-10-CM | POA: Insufficient documentation

## 2014-04-12 DIAGNOSIS — Z85528 Personal history of other malignant neoplasm of kidney: Secondary | ICD-10-CM | POA: Diagnosis not present

## 2014-04-12 DIAGNOSIS — Z7982 Long term (current) use of aspirin: Secondary | ICD-10-CM | POA: Insufficient documentation

## 2014-04-12 DIAGNOSIS — E785 Hyperlipidemia, unspecified: Secondary | ICD-10-CM | POA: Diagnosis not present

## 2014-04-12 DIAGNOSIS — G609 Hereditary and idiopathic neuropathy, unspecified: Secondary | ICD-10-CM | POA: Insufficient documentation

## 2014-04-12 DIAGNOSIS — Z880 Allergy status to penicillin: Secondary | ICD-10-CM | POA: Insufficient documentation

## 2014-04-12 DIAGNOSIS — Z79899 Other long term (current) drug therapy: Secondary | ICD-10-CM | POA: Diagnosis not present

## 2014-04-12 DIAGNOSIS — K219 Gastro-esophageal reflux disease without esophagitis: Secondary | ICD-10-CM | POA: Insufficient documentation

## 2014-04-12 LAB — CBC
HCT: 46 % (ref 39.0–52.0)
Hemoglobin: 15.5 g/dL (ref 13.0–17.0)
MCH: 29.3 pg (ref 26.0–34.0)
MCHC: 33.7 g/dL (ref 30.0–36.0)
MCV: 87 fL (ref 78.0–100.0)
Platelets: 272 10*3/uL (ref 150–400)
RBC: 5.29 MIL/uL (ref 4.22–5.81)
RDW: 13.8 % (ref 11.5–15.5)
WBC: 6 10*3/uL (ref 4.0–10.5)

## 2014-04-12 LAB — BASIC METABOLIC PANEL
Anion gap: 13 (ref 5–15)
BUN: 24 mg/dL — ABNORMAL HIGH (ref 6–23)
CO2: 24 mEq/L (ref 19–32)
Calcium: 9.9 mg/dL (ref 8.4–10.5)
Chloride: 103 mEq/L (ref 96–112)
Creatinine, Ser: 1.21 mg/dL (ref 0.50–1.35)
GFR calc Af Amer: 71 mL/min — ABNORMAL LOW (ref >90)
GFR calc non Af Amer: 61 mL/min — ABNORMAL LOW (ref >90)
Glucose, Bld: 113 mg/dL — ABNORMAL HIGH (ref 70–99)
Potassium: 4.1 mEq/L (ref 3.7–5.3)
Sodium: 140 mEq/L (ref 137–147)

## 2014-04-12 LAB — I-STAT TROPONIN, ED: Troponin i, poc: 0 ng/mL (ref 0.00–0.08)

## 2014-04-12 MED ORDER — HYDROMORPHONE HCL PF 1 MG/ML IJ SOLN
1.0000 mg | Freq: Once | INTRAMUSCULAR | Status: AC
  Administered 2014-04-12: 1 mg via INTRAMUSCULAR
  Filled 2014-04-12: qty 1

## 2014-04-12 MED ORDER — HYDROCODONE-ACETAMINOPHEN 5-325 MG PO TABS: 1.0000 | ORAL_TABLET | ORAL | Status: DC | PRN

## 2014-04-12 NOTE — Discharge Instructions (Signed)

## 2014-04-12 NOTE — ED Notes (Signed)
Pt states that he woke up this morning w/ lt arm pain.  Denies injury.  States the pain starts in his lt shoulder and ends in his elbow.

## 2014-04-12 NOTE — ED Provider Notes (Signed)
CSN: [id number]     Arrival date & time 04/12/14  1057 History   First MD Initiated Contact with Patient 04/12/14 1140     Chief Complaint  Patient presents with  . Arm Pain    HPI Sharp stabbing pain started this morning when he woke up.   The pain starts in the shoulder and goes down to the mid forearm.  Raising the arm increases the pain.  No chest pain or shortness of breath.  No numbness,no focal weakness.  For weeks he has not had a lot of energy but this is new.    Past Medical History  Diagnosis Date  . Asthma   . Allergic rhinitis   . Heart attack 2009  . Renal cell carcinoma 1997    Left kidney  . GERD (gastroesophageal reflux disease)   . IBS (irritable bowel syndrome)   . Diverticulosis   . History of esophageal stricture   . Internal hemorrhoids   . Depression   . Osteoarthritis   . Peripheral neuropathy   . Depression, prolonged 06/26/2013  . Hypertension   . Hyperlipidemia   . Coronary artery disease   . Sleep talking   . Snoring 12/25/2013  . 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 Back surgery  1992, 2013  . Neck surgery  2005  . Coronary stent placement    . Femur fracture surgery Left 1995  . Total knee arthroplasty Left 2005  . Carpal tunnel release Bilateral   . Nephrectomy Left     secondary to cancer  . Cardiac catheterization  05/10/2007    Minimal CAD, normal LV systolic function, medical management  . Cardiac catheterization  04/08/2008    RCA ulcerated 70-80% stenosis, stented with a 3x33mm Endeavor stent at 13atm for 50sec, reduced from 80% ulcerated stenosis to 0%.  . Cardiac catheterization  05/07/2009    50% distal left main disease-IVUS or flow wire too dangerous in particular setting to perform intervention.  . Cardiac catheterization  03/18/2010    Medical management  . Cardiovascular stress test  02/09/2012    Normal, no significant wall abnormalities noted   Family History   Problem Relation Age of Onset  . Asthma Mother   . Heart disease Mother   . Hypertension Mother   . Heart disease Paternal Grandfather   . Heart attack Paternal Grandfather 46  . Esophageal cancer Father   . Barrett's esophagus Father   . Bone cancer Father     mets from esophagus  . Heart disease Father   . Colon cancer Maternal Uncle   . Diabetes Sister   . Cancer Sister     Cervical cancer   History  Substance Use Topics  . Smoking status: Never Smoker   . Smokeless tobacco: Never Used  . Alcohol Use: No    Review of Systems  Constitutional: Negative for fever.  Respiratory: Negative for wheezing.   Cardiovascular: Negative for chest pain.  Neurological: Negative for seizures.  All other systems reviewed and are negative.     Allergies  Contrast media; Nsaids; Penicillins; Sulfonamide derivatives; Codeine; and Statins  Home Medications   Prior to Admission medications   Medication Sig Start Date End Date Taking? Authorizing Provider  albuterol (PROVENTIL HFA;VENTOLIN HFA) 108 (90 BASE) MCG/ACT inhaler Inhale 2 puffs into the lungs every 6 (six) hours as needed for wheezing or shortness of breath.   Yes Historical Provider, MD  ARIPiprazole (  ABILIFY) 2 MG tablet Take 4 mg by mouth daily.    Yes Historical Provider, MD  aspirin 325 MG tablet Take 325 mg by mouth daily.   Yes Historical Provider, MD  DULoxetine (CYMBALTA) 60 MG capsule Take 60 mg by mouth daily.   Yes Historical Provider, MD  ezetimibe (ZETIA) 10 MG tablet Take 10 mg by mouth daily.   Yes Historical Provider, MD  finasteride (PROSCAR) 5 MG tablet Take 5 mg by mouth daily.   Yes Historical Provider, MD  fish oil-omega-3 fatty acids 1000 MG capsule Take 1 capsule by mouth at bedtime.    Yes Historical Provider, MD  gemfibrozil (LOPID) 600 MG tablet Take 600 mg by mouth 2 (two) times daily.   Yes Historical Provider, MD  isosorbide mononitrate (IMDUR) 30 MG 24 hr tablet Take 1 tablet (30 mg total) by  mouth daily. 02/17/14  Yes Lorretta Harp, MD  levothyroxine (SYNTHROID, LEVOTHROID) 25 MCG tablet Take 25 mcg by mouth at bedtime.    Yes Historical Provider, MD  Multiple Vitamin (MULTIVITAMIN) tablet Take 1 tablet by mouth daily.   Yes Historical Provider, MD  nitroGLYCERIN (NITROSTAT) 0.4 MG SL tablet Place 1 tablet (0.4 mg total) under the tongue every 5 (five) minutes as needed for chest pain. 06/25/13  Yes Cecilie Kicks, NP  omalizumab [name]) 150 MG injection Inject into the skin every 30 (thirty) days.    Yes Historical Provider, MD  pantoprazole (PROTONIX) 40 MG tablet Take 40 mg by mouth daily.   Yes Historical Provider, MD  silodosin (RAPAFLO) 8 MG CAPS capsule Take 8 mg by mouth daily with breakfast.   Yes Historical Provider, MD  Suvorexant (BELSOMRA) 10 MG TABS Take 10 mg by mouth Nightly. 12/25/13  Yes [name] Dohmeier, MD  vitamin C (ASCORBIC ACID) 500 MG tablet Take 500 mg by mouth 2 (two) times daily.    Yes Historical Provider, MD  HYDROcodone-acetaminophen (NORCO/VICODIN) 5-325 MG per tablet Take 1-2 tablets by mouth every 4 (four) hours as needed. 04/12/14   Dorie Rank, MD   BP 128/79  Pulse 60  Temp(Src) 98.2 F (36.8 C) (Oral)  Resp 17  SpO2 93% Physical Exam  Nursing note and vitals reviewed. Constitutional: He appears well-developed and well-nourished. No distress.  HENT:  Head: Normocephalic and atraumatic.  Right Ear: External ear normal.  Left Ear: External ear normal.  Eyes: Conjunctivae are normal. Right eye exhibits no discharge. Left eye exhibits no discharge. No scleral icterus.  Neck: Neck supple. No tracheal deviation present.  Cardiovascular: Normal rate, regular rhythm and intact distal pulses.   Pulses:      Radial pulses are 2+ on the left side.  Pulmonary/Chest: Effort normal and breath sounds normal. No stridor. No respiratory distress. He has no wheezes. He has no rales.  Abdominal: Soft. Bowel sounds are normal. He exhibits no distension. There is  no tenderness. There is no rebound and no guarding.  Musculoskeletal: He exhibits no edema.       Right shoulder: Normal.       Left shoulder: He exhibits decreased range of motion. He exhibits no tenderness and no bony tenderness.       Left elbow: He exhibits normal range of motion, no swelling, no effusion and no deformity. No tenderness found.       Cervical back: He exhibits tenderness. He exhibits no bony tenderness.  Neurological: He is alert. He has normal strength. No cranial nerve deficit (no facial droop, extraocular movements intact, no  slurred speech) or sensory deficit. He exhibits normal muscle tone. He displays no seizure activity. Coordination normal.  Skin: Skin is warm and dry. No rash noted.  Psychiatric: He has a normal mood and affect.    ED Course  Procedures (including critical care time) Labs Review Labs Reviewed  BASIC METABOLIC PANEL - Abnormal; Notable for the following:    Glucose, Bld 113 (*)    BUN 24 (*)    GFR calc non Af Amer 61 (*)    GFR calc Af Amer 71 (*)    All other components within normal limits  CBC  I-STAT TROPOININ, ED    Imaging Review Dg Cervical Spine Complete  04/12/2014   CLINICAL DATA:  Neck and left arm pain. History of prior cervical fusion.  EXAM: CERVICAL SPINE  4+ VIEWS  COMPARISON:  CT cervical spine 10/12/2005.  FINDINGS: Anterior plate and screws and interbody bone plug are fusing C5-6. No complicating features are identified. Normal alignment of the cervical vertebral bodies. There are degenerative changes noted above and below the fusion level. No acute bony findings or abnormal prevertebral soft tissue swelling. Bony foraminal stenosis suspected at C6-7 due to uncinate spurring, left greater than right.  IMPRESSION: Anterior and interbody fusion changes at S9-3 without complicating features.  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 Bony foraminal narrowing suspected bilaterally at C6-7 due to uncinate spurring,  left greater than right.  No acute bony findings.   Electronically Signed   By: Kalman Jewels M.D.   On: 04/12/2014 13:05   Dg Shoulder Left  04/12/2014   CLINICAL DATA:  ARM PAIN  EXAM: LEFT SHOULDER - 2+ VIEW  COMPARISON:  None.  FINDINGS: There is no evidence of fracture or dislocation. There is no evidence of arthropathy or other focal bone abnormality. Soft tissues are unremarkable.  IMPRESSION: Negative.   Electronically Signed   By: Arne Cleveland M.D.   On: 04/12/2014 13:05     EKG Interpretation   Date/Time:  Saturday April 12 2014 11:19:23 EDT Ventricular Rate:  70 PR Interval:  153 QRS Duration: 86 QT Interval:  397 QTC Calculation: 428 R Axis:   61 Text Interpretation:  Sinus rhythm Probable left atrial enlargement  Abnormal R-wave progression, early transition Borderline T abnormalities,  anterior leads, similar to ECG Feb 2015 Confirmed by Nithila Sumners  MD-J, Tonica Brasington  ([id number]) on 04/12/2014 11:54:30 AM     Medications  HYDROmorphone (DILAUDID) injection 1 mg (1 mg Intramuscular Given 04/12/14 1242)    MDM   Final diagnoses:  Cervical radiculopathy   Patient's symptoms are consistent with a cervical radiculopathy. I doubt referred pain related to cardiac etiology. He does have history of prior neck surgery. Patient does not have any acute neurologic deficits on exam. I will give him a prescription for medications for pain and have him followup with his doctor or spine Dr.     Dorie Rank, MD 04/12/14 [phone]

## 2014-04-23 DIAGNOSIS — M5412 Radiculopathy, cervical region: Secondary | ICD-10-CM | POA: Insufficient documentation

## 2014-04-28 ENCOUNTER — Ambulatory Visit: Payer: Medicare Other | Admitting: Neurology

## 2014-05-02 ENCOUNTER — Encounter: Payer: Self-pay | Admitting: Neurology

## 2014-05-02 ENCOUNTER — Ambulatory Visit (INDEPENDENT_AMBULATORY_CARE_PROVIDER_SITE_OTHER): Payer: Medicare Other | Admitting: Neurology

## 2014-05-02 VITALS — BP 126/93 | HR 78 | Resp 16 | Ht 67.5 in | Wt 186.0 lb

## 2014-05-02 DIAGNOSIS — M539 Dorsopathy, unspecified: Secondary | ICD-10-CM

## 2014-05-02 DIAGNOSIS — M4322 Fusion of spine, cervical region: Secondary | ICD-10-CM

## 2014-05-02 DIAGNOSIS — F5105 Insomnia due to other mental disorder: Secondary | ICD-10-CM

## 2014-05-02 DIAGNOSIS — Z9989 Dependence on other enabling machines and devices: Principal | ICD-10-CM

## 2014-05-02 DIAGNOSIS — R5383 Other fatigue: Secondary | ICD-10-CM

## 2014-05-02 DIAGNOSIS — G4733 Obstructive sleep apnea (adult) (pediatric): Secondary | ICD-10-CM

## 2014-05-02 DIAGNOSIS — F409 Phobic anxiety disorder, unspecified: Secondary | ICD-10-CM

## 2014-05-02 DIAGNOSIS — R5381 Other malaise: Secondary | ICD-10-CM

## 2014-05-02 DIAGNOSIS — F489 Nonpsychotic mental disorder, unspecified: Secondary | ICD-10-CM

## 2014-05-02 DIAGNOSIS — G4752 REM sleep behavior disorder: Secondary | ICD-10-CM

## 2014-05-02 HISTORY — DX: Fusion of spine, cervical region: M43.22

## 2014-05-02 MED ORDER — SUVOREXANT 10 MG PO TABS: 10.0000 mg | ORAL_TABLET | Freq: Every evening | ORAL | Status: DC

## 2014-05-02 MED ORDER — MELATONIN 3 MG PO TABS: ORAL_TABLET | ORAL | Status: DC

## 2014-05-02 NOTE — Patient Instructions (Signed)
Place neck pain patient instructions here.   Parkinsonian features, on abilify  REM sleep behavior .  Apnea on CPAP. Hypoxemia reduced, not resolved.
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
## 2014-05-02 NOTE — Addendum Note (Signed)
Addended by: Larey Seat on: 05/02/2014 09:15 AM   Modules accepted: Orders

## 2014-05-19 ENCOUNTER — Ambulatory Visit (INDEPENDENT_AMBULATORY_CARE_PROVIDER_SITE_OTHER): Payer: Medicare Other | Admitting: Neurology

## 2014-05-19 ENCOUNTER — Encounter: Payer: Self-pay | Admitting: Neurology

## 2014-05-19 ENCOUNTER — Ambulatory Visit: Payer: Medicare Other | Admitting: Neurology

## 2014-05-19 VITALS — BP 124/83 | HR 72 | Ht 67.5 in | Wt 189.0 lb

## 2014-05-19 DIAGNOSIS — G4761 Periodic limb movement disorder: Secondary | ICD-10-CM

## 2014-05-19 DIAGNOSIS — G4733 Obstructive sleep apnea (adult) (pediatric): Secondary | ICD-10-CM

## 2014-05-19 DIAGNOSIS — Z9989 Dependence on other enabling machines and devices: Secondary | ICD-10-CM

## 2014-05-19 DIAGNOSIS — G20 Parkinson's disease: Secondary | ICD-10-CM

## 2014-05-19 DIAGNOSIS — G4752 REM sleep behavior disorder: Secondary | ICD-10-CM
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
## 2014-05-19 NOTE — Patient Instructions (Signed)
You do have mild signs of parkinsonism. This may be due to being on Abilify, and findings are mild. Dr. Brett Fairy may do an MRI of your brain. Please talk to your psychiatrist about potentially tapering off of abilify. Even if you have medication induced parkinsonism, it may take weeks or months for the parkinsonism to improve. Down the road, you may want to consider doing a DaT scan, but there are restrictions with this scan, such as your single kidney status and your being on an antidepressant.

## 2014-06-12 ENCOUNTER — Other Ambulatory Visit: Payer: Self-pay | Admitting: Neurosurgery

## 2014-06-12 DIAGNOSIS — M5412 Radiculopathy, cervical region: Secondary | ICD-10-CM

## 2014-06-17 ENCOUNTER — Telehealth: Payer: Self-pay | Admitting: Neurology

## 2014-06-17 DIAGNOSIS — R259 Unspecified abnormal involuntary movements: Secondary | ICD-10-CM

## 2014-06-17 NOTE — Telephone Encounter (Signed)
Patient calling to state that he has been waiting to have his MRI scheduled for 4 weeks now, wants to know if he's still supposed to get one done. Please return call and advise.

## 2014-06-17 NOTE — Telephone Encounter (Signed)
You can try, pls call pt to discuss. I advised him to discuss with you.

## 2014-06-17 NOTE — Telephone Encounter (Signed)
No disconnect, see my recs. Pt to discuss with Dr. Keturah Barre. No rush, see my A&P

## 2014-06-17 NOTE — Telephone Encounter (Signed)
Saima: I would  order a DATA scan and ask the patient to hydrate well and   1)Take his antidepressant ( abilify ) for every other day for 14 days. Hopefully allowing a normal brain  uptake.  2) I think a MRI non contrast is of limited help

## 2014-06-17 NOTE — Telephone Encounter (Signed)
I see no order.

## 2014-06-18 ENCOUNTER — Ambulatory Visit
Admission: RE | Admit: 2014-06-18 | Discharge: 2014-06-18 | Disposition: A | Discharge status: Home / Self Care | Payer: 59 | Source: Ambulatory Visit | Attending: Neurosurgery | Admitting: Neurosurgery

## 2014-06-18 DIAGNOSIS — M5412 Radiculopathy, cervical region: Secondary | ICD-10-CM

## 2014-06-18 MED ORDER — TRIAMCINOLONE ACETONIDE 40 MG/ML IJ SUSP (RADIOLOGY)
60.0000 mg | Freq: Once | INTRAMUSCULAR | Status: AC
  Administered 2014-06-18: 60 mg via EPIDURAL

## 2014-06-18 MED ORDER — IOHEXOL 300 MG/ML  SOLN
1.0000 mL | Freq: Once | INTRAMUSCULAR | Status: AC | PRN
  Administered 2014-06-18: 1 mL via EPIDURAL

## 2014-06-18 NOTE — Discharge Instructions (Signed)

## 2014-06-18 NOTE — Telephone Encounter (Signed)
Yes, Jared [name], this is your patient.

## 2014-06-20 ENCOUNTER — Encounter: Payer: Self-pay | Admitting: Neurology

## 2014-06-20 NOTE — Telephone Encounter (Signed)
   Patient may not benefit from a DAT scan as long as on SSRI medication, but he had not a baseline MRI yet either, will order MRI to rule out  vascular component. There has been no MRI in the [facility] system, the patient was left a voicemail to respond to on Monday AM .

## 2014-06-25 DIAGNOSIS — R259 Unspecified abnormal involuntary movements: Secondary | ICD-10-CM | POA: Insufficient documentation

## 2014-06-27 NOTE — Telephone Encounter (Signed)
LMVM for pt to return call about MRI.

## 2014-07-07 ENCOUNTER — Other Ambulatory Visit: Payer: Self-pay | Admitting: Neurosurgery

## 2014-07-07 DIAGNOSIS — M542 Cervicalgia: Secondary | ICD-10-CM

## 2014-07-08 ENCOUNTER — Encounter: Payer: Self-pay | Admitting: Cardiovascular Disease

## 2014-07-08 ENCOUNTER — Ambulatory Visit
Admission: RE | Admit: 2014-07-08 | Discharge: 2014-07-08 | Disposition: A | Discharge status: Home / Self Care | Payer: 59 | Source: Ambulatory Visit | Attending: Neurology | Admitting: Neurology

## 2014-07-08 ENCOUNTER — Ambulatory Visit (INDEPENDENT_AMBULATORY_CARE_PROVIDER_SITE_OTHER): Payer: 59 | Admitting: Cardiovascular Disease

## 2014-07-08 VITALS — BP 132/82 | HR 61 | Ht 68.0 in | Wt 188.5 lb

## 2014-07-08 DIAGNOSIS — I2510 Atherosclerotic heart disease of native coronary artery without angina pectoris: Secondary | ICD-10-CM

## 2014-07-08 DIAGNOSIS — I10 Essential (primary) hypertension: Secondary | ICD-10-CM

## 2014-07-08 DIAGNOSIS — R259 Unspecified abnormal involuntary movements: Secondary | ICD-10-CM

## 2014-07-08 MED ORDER — GADOBENATE DIMEGLUMINE 529 MG/ML IV SOLN
17.0000 mL | Freq: Once | INTRAVENOUS | Status: AC | PRN
  Administered 2014-07-08: 17 mL via INTRAVENOUS

## 2014-07-08 NOTE — Patient Instructions (Signed)
Your physician wants you to follow-up in: 1 year with Dr Berry. You will receive a reminder letter in the mail two months in advance. If you don't receive a letter, please call our office to schedule the follow-up appointment.  

## 2014-07-08 NOTE — Assessment & Plan Note (Signed)
Controlled on current medications 
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
## 2014-07-08 NOTE — Assessment & Plan Note (Signed)
History of CAD status post stenting of the dominant RCA in 2000. He had mild left main when he was last cathed in 2011 with a widely patent stent of his RCA normal LV function. He had an negative Myoview stress test 02/09/12. He denies chest pain or shortness of breath.

## 2014-07-08 NOTE — Assessment & Plan Note (Signed)
On fish oil and Lopid as well as Zetia. His lipid profile followed by his permit care physician.Marland Kitchen

## 2014-07-10 ENCOUNTER — Other Ambulatory Visit: Payer: 59

## 2014-07-11 ENCOUNTER — Telehealth: Payer: Self-pay | Admitting: Neurology

## 2014-07-11 ENCOUNTER — Ambulatory Visit
Admission: RE | Admit: 2014-07-11 | Discharge: 2014-07-11 | Disposition: A | Discharge status: Home / Self Care | Payer: 59 | Source: Ambulatory Visit | Attending: Neurosurgery | Admitting: Neurosurgery

## 2014-07-11 DIAGNOSIS — M542 Cervicalgia: Secondary | ICD-10-CM

## 2014-07-11 MED ORDER — TRIAMCINOLONE ACETONIDE 40 MG/ML IJ SUSP (RADIOLOGY)
60.0000 mg | Freq: Once | INTRAMUSCULAR | Status: AC
  Administered 2014-07-11: 60 mg via EPIDURAL

## 2014-07-11 MED ORDER — IOHEXOL 300 MG/ML  SOLN
1.0000 mL | Freq: Once | INTRAMUSCULAR | Status: AC | PRN
  Administered 2014-07-11: 1 mL via EPIDURAL

## 2014-07-11 NOTE — Telephone Encounter (Signed)
I asked the patient to call sandy and make a RV appointment, concerned for NPH, explained the mechanics of the disorder and the common presentation and treatment.

## 2014-07-15 NOTE — Telephone Encounter (Signed)
I called pt and made appt for 07-23-14 at 1100.  Pt confirmed.

## 2014-07-15 NOTE — Progress Notes (Signed)
Quick Note:  Results given to pt per Dr. Brett Fairy. Appt made for pt to come in 07-23-14 at 1100. Pt confirmed. ______

## 2014-07-23 ENCOUNTER — Ambulatory Visit (INDEPENDENT_AMBULATORY_CARE_PROVIDER_SITE_OTHER): Payer: Medicare Other | Admitting: Neurology

## 2014-07-23 ENCOUNTER — Encounter: Payer: Self-pay | Admitting: Neurology

## 2014-07-23 VITALS — BP 127/89 | HR 85 | Temp 97.0°F | Resp 14 | Ht 67.5 in | Wt 191.8 lb

## 2014-07-23 DIAGNOSIS — R930 Abnormal findings on diagnostic imaging of skull and head, not elsewhere classified: Secondary | ICD-10-CM

## 2014-07-23 DIAGNOSIS — R9089 Other abnormal findings on diagnostic imaging of central nervous system: Secondary | ICD-10-CM | POA: Insufficient documentation
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
## 2014-07-23 NOTE — Patient Instructions (Signed)
RV with CPAP in August 2016.  MOCA will be performed( meory test ) and walking test.

## 2014-08-23 ENCOUNTER — Emergency Department (HOSPITAL_COMMUNITY): Payer: Medicare Other

## 2014-08-23 ENCOUNTER — Inpatient Hospital Stay (HOSPITAL_COMMUNITY)
Admission: EM | Admit: 2014-08-23 | Discharge: 2014-08-30 | DRG: 472 | Disposition: A | Discharge status: Home Health | Payer: Medicare Other | Attending: General Surgery | Admitting: General Surgery

## 2014-08-23 ENCOUNTER — Encounter (HOSPITAL_COMMUNITY): Payer: Self-pay | Admitting: Neurology

## 2014-08-23 DIAGNOSIS — F329 Major depressive disorder, single episode, unspecified: Secondary | ICD-10-CM | POA: Diagnosis present

## 2014-08-23 DIAGNOSIS — M5022 Other cervical disc displacement, mid-cervical region: Secondary | ICD-10-CM | POA: Diagnosis present

## 2014-08-23 DIAGNOSIS — J45909 Unspecified asthma, uncomplicated: Secondary | ICD-10-CM | POA: Diagnosis present

## 2014-08-23 DIAGNOSIS — K5900 Constipation, unspecified: Secondary | ICD-10-CM | POA: Diagnosis present

## 2014-08-23 DIAGNOSIS — Z79899 Other long term (current) drug therapy: Secondary | ICD-10-CM | POA: Diagnosis not present

## 2014-08-23 DIAGNOSIS — I10 Essential (primary) hypertension: Secondary | ICD-10-CM | POA: Diagnosis present

## 2014-08-23 DIAGNOSIS — S13171A Dislocation of C6/C7 cervical vertebrae, initial encounter: Secondary | ICD-10-CM | POA: Diagnosis present

## 2014-08-23 DIAGNOSIS — R1310 Dysphagia, unspecified: Secondary | ICD-10-CM | POA: Diagnosis present

## 2014-08-23 DIAGNOSIS — Z7982 Long term (current) use of aspirin: Secondary | ICD-10-CM

## 2014-08-23 DIAGNOSIS — R29898 Other symptoms and signs involving the musculoskeletal system: Secondary | ICD-10-CM

## 2014-08-23 DIAGNOSIS — M79603 Pain in arm, unspecified: Secondary | ICD-10-CM

## 2014-08-23 DIAGNOSIS — S2231XA Fracture of one rib, right side, initial encounter for closed fracture: Secondary | ICD-10-CM | POA: Diagnosis present

## 2014-08-23 DIAGNOSIS — S060X9A Concussion with loss of consciousness of unspecified duration, initial encounter: Secondary | ICD-10-CM | POA: Diagnosis present

## 2014-08-23 DIAGNOSIS — Z85528 Personal history of other malignant neoplasm of kidney: Secondary | ICD-10-CM | POA: Diagnosis not present

## 2014-08-23 DIAGNOSIS — Z96652 Presence of left artificial knee joint: Secondary | ICD-10-CM | POA: Diagnosis present

## 2014-08-23 DIAGNOSIS — I2510 Atherosclerotic heart disease of native coronary artery without angina pectoris: Secondary | ICD-10-CM | POA: Diagnosis present

## 2014-08-23 DIAGNOSIS — M25512 Pain in left shoulder: Secondary | ICD-10-CM

## 2014-08-23 DIAGNOSIS — E785 Hyperlipidemia, unspecified: Secondary | ICD-10-CM | POA: Diagnosis present

## 2014-08-23 DIAGNOSIS — S12600A Unspecified displaced fracture of seventh cervical vertebra, initial encounter for closed fracture: Secondary | ICD-10-CM | POA: Diagnosis present

## 2014-08-23 DIAGNOSIS — Z981 Arthrodesis status: Secondary | ICD-10-CM | POA: Diagnosis not present

## 2014-08-23 DIAGNOSIS — I252 Old myocardial infarction: Secondary | ICD-10-CM

## 2014-08-23 DIAGNOSIS — K219 Gastro-esophageal reflux disease without esophagitis: Secondary | ICD-10-CM | POA: Diagnosis present

## 2014-08-23 DIAGNOSIS — W11XXXA Fall on and from ladder, initial encounter: Secondary | ICD-10-CM | POA: Diagnosis present

## 2014-08-23 DIAGNOSIS — Y93H9 Activity, other involving exterior property and land maintenance, building and construction: Secondary | ICD-10-CM | POA: Diagnosis not present

## 2014-08-23 DIAGNOSIS — N400 Enlarged prostate without lower urinary tract symptoms: Secondary | ICD-10-CM | POA: Diagnosis present

## 2014-08-23 DIAGNOSIS — M5412 Radiculopathy, cervical region: Secondary | ICD-10-CM | POA: Diagnosis present

## 2014-08-23 DIAGNOSIS — S129XXA Fracture of neck, unspecified, initial encounter: Secondary | ICD-10-CM

## 2014-08-23 DIAGNOSIS — E039 Hypothyroidism, unspecified: Secondary | ICD-10-CM | POA: Diagnosis present

## 2014-08-23 DIAGNOSIS — Z419 Encounter for procedure for purposes other than remedying health state, unspecified: Secondary | ICD-10-CM

## 2014-08-23 DIAGNOSIS — Y92017 Garden or yard in single-family (private) house as the place of occurrence of the external cause: Secondary | ICD-10-CM | POA: Diagnosis not present

## 2014-08-23 DIAGNOSIS — M1990 Unspecified osteoarthritis, unspecified site: Secondary | ICD-10-CM | POA: Diagnosis present

## 2014-08-23 DIAGNOSIS — G4730 Sleep apnea, unspecified: Secondary | ICD-10-CM | POA: Diagnosis present

## 2014-08-23 DIAGNOSIS — T1490XA Injury, unspecified, initial encounter: Secondary | ICD-10-CM

## 2014-08-23 LAB — PROTIME-INR
INR: 0.97 (ref 0.00–1.49)
Prothrombin Time: 13 seconds (ref 11.6–15.2)

## 2014-08-23 LAB — COMPREHENSIVE METABOLIC PANEL
ALT: 30 U/L (ref 0–53)
AST: 22 U/L (ref 0–37)
Albumin: 3.8 g/dL (ref 3.5–5.2)
Alkaline Phosphatase: 70 U/L (ref 39–117)
Anion gap: 15 (ref 5–15)
BUN: 24 mg/dL — ABNORMAL HIGH (ref 6–23)
CO2: 24 mEq/L (ref 19–32)
Calcium: 10.9 mg/dL — ABNORMAL HIGH (ref 8.4–10.5)
Chloride: 103 mEq/L (ref 96–112)
Creatinine, Ser: 1.44 mg/dL — ABNORMAL HIGH (ref 0.50–1.35)
GFR calc Af Amer: 57 mL/min — ABNORMAL LOW (ref >90)
GFR calc non Af Amer: 50 mL/min — ABNORMAL LOW (ref >90)
Glucose, Bld: 107 mg/dL — ABNORMAL HIGH (ref 70–99)
Potassium: 3.8 mEq/L (ref 3.7–5.3)
Sodium: 142 mEq/L (ref 137–147)
Total Bilirubin: 0.4 mg/dL (ref 0.3–1.2)
Total Protein: 7.5 g/dL (ref 6.0–8.3)

## 2014-08-23 LAB — CBC
HCT: 47.1 % (ref 39.0–52.0)
Hemoglobin: 15.9 g/dL (ref 13.0–17.0)
MCH: 29.1 pg (ref 26.0–34.0)
MCHC: 33.8 g/dL (ref 30.0–36.0)
MCV: 86.1 fL (ref 78.0–100.0)
Platelets: 227 10*3/uL (ref 150–400)
RBC: 5.47 MIL/uL (ref 4.22–5.81)
RDW: 14.3 % (ref 11.5–15.5)
WBC: 8.5 10*3/uL (ref 4.0–10.5)

## 2014-08-23 LAB — I-STAT CHEM 8, ED
BUN: 26 mg/dL — ABNORMAL HIGH (ref 6–23)
Calcium, Ion: 1.34 mmol/L — ABNORMAL HIGH (ref 1.13–1.30)
Chloride: 106 mEq/L (ref 96–112)
Creatinine, Ser: 1.5 mg/dL — ABNORMAL HIGH (ref 0.50–1.35)
Glucose, Bld: 112 mg/dL — ABNORMAL HIGH (ref 70–99)
HCT: 50 % (ref 39.0–52.0)
Hemoglobin: 17 g/dL (ref 13.0–17.0)
Potassium: 3.6 mEq/L — ABNORMAL LOW (ref 3.7–5.3)
Sodium: 142 mEq/L (ref 137–147)
TCO2: 23 mmol/L (ref 0–100)

## 2014-08-23 LAB — CDS SEROLOGY

## 2014-08-23 LAB — SAMPLE TO BLOOD BANK

## 2014-08-23 LAB — ETHANOL: Alcohol, Ethyl (B): <11 mg/dL (ref 0–11)

## 2014-08-23 MED ORDER — DIPHENHYDRAMINE HCL 50 MG/ML IJ SOLN
25.0000 mg | Freq: Once | INTRAMUSCULAR | Status: AC
  Administered 2014-08-23: 25 mg via INTRAVENOUS
  Filled 2014-08-23: qty 1

## 2014-08-23 MED ORDER — HYDROMORPHONE HCL 1 MG/ML IJ SOLN
0.5000 mg | INTRAMUSCULAR | Status: DC | PRN
  Administered 2014-08-23 – 2014-08-24 (×4): 1 mg via INTRAVENOUS
  Administered 2014-08-24: 2 mg via INTRAVENOUS
  Administered 2014-08-25: 1 mg via INTRAVENOUS
  Administered 2014-08-25 – 2014-08-26 (×5): 2 mg via INTRAVENOUS
  Administered 2014-08-26: 1 mg via INTRAVENOUS
  Administered 2014-08-26 – 2014-08-27 (×2): 2 mg via INTRAVENOUS
  Administered 2014-08-27 (×2): 1 mg via INTRAVENOUS
  Administered 2014-08-27 – 2014-08-29 (×7): 2 mg via INTRAVENOUS
  Filled 2014-08-23: qty 1
  Filled 2014-08-23 (×5): qty 2
  Filled 2014-08-23 (×2): qty 1
  Filled 2014-08-23 (×3): qty 2
  Filled 2014-08-23 (×3): qty 1
  Filled 2014-08-23 (×2): qty 2
  Filled 2014-08-23: qty 1
  Filled 2014-08-23 (×4): qty 2
  Filled 2014-08-23 (×2): qty 1
  Filled 2014-08-23: qty 2

## 2014-08-23 MED ORDER — SODIUM CHLORIDE 0.9 % IV SOLN: 250.0000 mL | INTRAVENOUS | Status: DC | PRN

## 2014-08-23 MED ORDER — SODIUM CHLORIDE 0.9 % IV BOLUS (SEPSIS)
1000.0000 mL | Freq: Once | INTRAVENOUS | Status: AC
Start: 2014-08-23 — End: 2014-08-23
  Administered 2014-08-23: 1000 mL via INTRAVENOUS

## 2014-08-23 MED ORDER — FINASTERIDE 5 MG PO TABS
5.0000 mg | ORAL_TABLET | Freq: Every day | ORAL | Status: DC
  Administered 2014-08-24 – 2014-08-29 (×7): 5 mg via ORAL
  Filled 2014-08-23 (×9): qty 1

## 2014-08-23 MED ORDER — ONDANSETRON HCL 4 MG PO TABS: 4.0000 mg | ORAL_TABLET | Freq: Four times a day (QID) | ORAL | Status: DC | PRN

## 2014-08-23 MED ORDER — ALBUTEROL SULFATE (2.5 MG/3ML) 0.083% IN NEBU
3.0000 mL | INHALATION_SOLUTION | Freq: Four times a day (QID) | RESPIRATORY_TRACT | Status: DC | PRN
  Administered 2014-08-24 – 2014-08-28 (×3): 3 mL via RESPIRATORY_TRACT
  Filled 2014-08-23 (×3): qty 3

## 2014-08-23 MED ORDER — LEVOTHYROXINE SODIUM 25 MCG PO TABS
25.0000 ug | ORAL_TABLET | Freq: Three times a day (TID) | ORAL | Status: DC
  Administered 2014-08-24 – 2014-08-25 (×2): 25 ug via ORAL
  Filled 2014-08-23 (×2): qty 1

## 2014-08-23 MED ORDER — OXYCODONE HCL 5 MG PO TABS
5.0000 mg | ORAL_TABLET | ORAL | Status: DC | PRN
  Administered 2014-08-24 – 2014-08-29 (×10): 10 mg via ORAL
  Filled 2014-08-23 (×10): qty 2

## 2014-08-23 MED ORDER — BACITRACIN ZINC 500 UNIT/GM EX OINT
TOPICAL_OINTMENT | Freq: Two times a day (BID) | CUTANEOUS | Status: DC
  Administered 2014-08-24 – 2014-08-27 (×9): via TOPICAL
  Administered 2014-08-28: 1 via TOPICAL
  Administered 2014-08-28 – 2014-08-29 (×2): via TOPICAL
  Administered 2014-08-29: 1 via TOPICAL
  Administered 2014-08-30: 10:00:00 via TOPICAL
  Filled 2014-08-23 (×2): qty 28.35

## 2014-08-23 MED ORDER — CEFAZOLIN SODIUM 1-5 GM-% IV SOLN
1.0000 g | Freq: Once | INTRAVENOUS | Status: AC
  Administered 2014-08-23: 1 g via INTRAVENOUS
  Filled 2014-08-23: qty 50

## 2014-08-23 MED ORDER — MORPHINE SULFATE 4 MG/ML IJ SOLN
4.0000 mg | Freq: Once | INTRAMUSCULAR | Status: AC
  Administered 2014-08-23: 4 mg via INTRAVENOUS
  Filled 2014-08-23: qty 1

## 2014-08-23 MED ORDER — ARIPIPRAZOLE 5 MG PO TABS
5.0000 mg | ORAL_TABLET | Freq: Every day | ORAL | Status: DC
Start: 2014-08-24 — End: 2014-08-30
  Administered 2014-08-24 – 2014-08-30 (×6): 5 mg via ORAL
  Filled 2014-08-23 (×7): qty 1

## 2014-08-23 MED ORDER — DULOXETINE HCL 60 MG PO CPEP
60.0000 mg | ORAL_CAPSULE | Freq: Every day | ORAL | Status: DC
  Administered 2014-08-24 – 2014-08-30 (×6): 60 mg via ORAL
  Filled 2014-08-23 (×6): qty 1

## 2014-08-23 MED ORDER — EZETIMIBE 10 MG PO TABS
10.0000 mg | ORAL_TABLET | Freq: Every day | ORAL | Status: DC
  Administered 2014-08-24 – 2014-08-30 (×6): 10 mg via ORAL
  Filled 2014-08-23 (×6): qty 1

## 2014-08-23 MED ORDER — ISOSORBIDE MONONITRATE ER 30 MG PO TB24
30.0000 mg | ORAL_TABLET | Freq: Every day | ORAL | Status: DC
Start: 2014-08-24 — End: 2014-08-30
  Administered 2014-08-24 – 2014-08-29 (×6): 30 mg via ORAL
  Filled 2014-08-23 (×8): qty 1

## 2014-08-23 MED ORDER — SODIUM CHLORIDE 0.9 % IJ SOLN
3.0000 mL | Freq: Two times a day (BID) | INTRAMUSCULAR | Status: DC
  Administered 2014-08-24 – 2014-08-29 (×10): 3 mL via INTRAVENOUS

## 2014-08-23 MED ORDER — DOCUSATE SODIUM 100 MG PO CAPS
100.0000 mg | ORAL_CAPSULE | Freq: Two times a day (BID) | ORAL | Status: DC
  Administered 2014-08-24 – 2014-08-30 (×13): 100 mg via ORAL
  Filled 2014-08-23 (×14): qty 1

## 2014-08-23 MED ORDER — ONDANSETRON HCL 4 MG/2ML IJ SOLN
4.0000 mg | Freq: Once | INTRAMUSCULAR | Status: AC
  Administered 2014-08-23: 4 mg via INTRAVENOUS
  Filled 2014-08-23: qty 2

## 2014-08-23 MED ORDER — IOHEXOL 300 MG/ML  SOLN
80.0000 mL | Freq: Once | INTRAMUSCULAR | Status: AC | PRN
  Administered 2014-08-23: 80 mL via INTRAVENOUS

## 2014-08-23 MED ORDER — ACETAMINOPHEN 325 MG PO TABS: 650.0000 mg | ORAL_TABLET | ORAL | Status: DC | PRN

## 2014-08-23 MED ORDER — HYDROMORPHONE HCL 1 MG/ML IJ SOLN
1.0000 mg | Freq: Once | INTRAMUSCULAR | Status: AC
  Administered 2014-08-23: 1 mg via INTRAVENOUS
  Filled 2014-08-23: qty 1

## 2014-08-23 MED ORDER — NITROGLYCERIN 0.4 MG SL SUBL: 0.4000 mg | SUBLINGUAL_TABLET | SUBLINGUAL | Status: DC | PRN

## 2014-08-23 MED ORDER — TAMSULOSIN HCL 0.4 MG PO CAPS
0.4000 mg | ORAL_CAPSULE | Freq: Every day | ORAL | Status: DC
  Administered 2014-08-24 – 2014-08-29 (×6): 0.4 mg via ORAL
  Filled 2014-08-23 (×7): qty 1

## 2014-08-23 MED ORDER — ADULT MULTIVITAMIN W/MINERALS CH
1.0000 | ORAL_TABLET | Freq: Every day | ORAL | Status: DC
Start: 2014-08-24 — End: 2014-08-30
  Administered 2014-08-24 – 2014-08-30 (×6): 1 via ORAL
  Filled 2014-08-23 (×6): qty 1

## 2014-08-23 MED ORDER — ONDANSETRON HCL 4 MG/2ML IJ SOLN
4.0000 mg | Freq: Four times a day (QID) | INTRAMUSCULAR | Status: DC | PRN
  Administered 2014-08-25: 4 mg via INTRAVENOUS
  Filled 2014-08-23: qty 2

## 2014-08-23 MED ORDER — SODIUM CHLORIDE 0.9 % IJ SOLN: 3.0000 mL | INTRAMUSCULAR | Status: DC | PRN

## 2014-08-23 MED ORDER — GEMFIBROZIL 600 MG PO TABS
600.0000 mg | ORAL_TABLET | Freq: Two times a day (BID) | ORAL | Status: DC
Start: 2014-08-23 — End: 2014-08-30
  Administered 2014-08-24 – 2014-08-30 (×13): 600 mg via ORAL
  Filled 2014-08-23 (×17): qty 1

## 2014-08-23 NOTE — Consult Note (Signed)
Reason for Consult:Broken neck Referring Physician: Oshen [name] is an 63 y.o. male.  HPI: The history is provided by the patient.  Jared [name] is a 63 y.o. male hx of renal cell carcinoma s/p L nephrectomy in 1997 here with fall. He was up on the ladder cleaning the gutter today with a leak blower and states that when he tried to climb down from the roof the ladder gave way and he fell. He states he is maybe 10-15 feet off the ground. He had loss of consciousness was laying on the ground. He is complaining of neck pain and right arm pain. Was noted to have multiple abrasions, including left ear, right axilla, both legs, tetanus up to date. He has had two prior neck surgeries, one by Dr. Joya [name] (he thinks C 34 approximately 8 years ago) and the last was a C 56 fusion by Dr. Hal [name] approximately 2 years ago.   Past Medical History  Diagnosis Date  . Asthma   . Allergic rhinitis   . Heart attack 2009  . Renal cell carcinoma 1997    Left kidney  . GERD (gastroesophageal reflux disease)   . IBS (irritable bowel syndrome)   . Diverticulosis   . History of esophageal stricture   . Internal hemorrhoids   . Depression   . Osteoarthritis   . Peripheral neuropathy   . Depression, prolonged 06/26/2013  . Hypertension   . Hyperlipidemia   . Coronary artery disease   . Sleep talking   . Snoring 12/25/2013  . 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 Cervical vertebral fusion 05/02/2014    Now presenting with C5-6 radiculopathy  . Shoulder pain, right     Past Surgical History  Procedure Laterality Date  . Back surgery  1992, 2013  . Neck surgery  2005  . Coronary stent placement    . Femur fracture surgery Left 1995  . Total knee arthroplasty Left 2005  . Carpal tunnel release Bilateral   . Nephrectomy Left     secondary to cancer  . Cardiac catheterization  05/10/2007    Minimal CAD, normal LV systolic function, medical management  . Cardiac catheterization   04/08/2008    RCA ulcerated 70-80% stenosis, stented with a 3x93m Endeavor stent at 13atm for 50sec, reduced from 80% ulcerated stenosis to 0%.  . Cardiac catheterization  05/07/2009    50% distal left main disease-IVUS or flow wire too dangerous in particular setting to perform intervention.  . Cardiac catheterization  03/18/2010    Medical management  . Cardiovascular stress test  02/09/2012    Normal, no significant wall abnormalities noted    Family History  Problem Relation Age of Onset  . Asthma Mother   . Heart disease Mother   . Hypertension Mother   . Heart disease Paternal Grandfather   . Heart attack Paternal Grandfather [id number] . Esophageal cancer Father   . Barrett's esophagus Father   . Bone cancer Father     mets from esophagus  . Heart disease Father   . Colon cancer Maternal Uncle   . Diabetes Sister   . Cancer Sister     Cervical cancer    Social History:  reports that he has never smoked. He has never used smokeless tobacco. He reports that he does not drink alcohol or use illicit drugs.  Allergies:  Allergies  Allergen Reactions  . Contrast Media [Iodinated Diagnostic Agents] Other (See Comments)    Was told  not to take d/t pt only having 1 kidney  . 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 Penicillins Hives and Itching  . Sulfonamide Derivatives Hives and Itching  . Codeine Nausea Only  . Statins Other (See Comments)    Myalgias    Medications: I have reviewed the patient's current medications.  Results for orders placed or performed during the hospital encounter of 08/23/14 (from the past 48 hour(s))  Sample to Blood Bank     Status: None   Collection Time: 08/23/14  5:10 PM  Result Value Ref Range   Blood Bank Specimen SAMPLE AVAILABLE FOR TESTING    Sample Expiration 08/24/2014   CDS serology     Status: None   Collection Time: 08/23/14  5:11 PM  Result Value Ref Range   CDS serology specimen       SPECIMEN WILL BE HELD FOR 14 DAYS IF TESTING IS REQUIRED  CBC     Status: None   Collection Time: 08/23/14  5:11 PM  Result Value Ref Range   WBC 8.5 4.0 - 10.5 K/uL   RBC 5.47 4.22 - 5.81 MIL/uL   Hemoglobin 15.9 13.0 - 17.0 g/dL   HCT 47.1 39.0 - 52.0 %   MCV 86.1 78.0 - 100.0 fL   MCH 29.1 26.0 - 34.0 pg   MCHC 33.8 30.0 - 36.0 g/dL   RDW 14.3 11.5 - 15.5 %   Platelets 227 150 - 400 K/uL  Protime-INR     Status: None   Collection Time: 08/23/14  5:11 PM  Result Value Ref Range   Prothrombin Time 13.0 11.6 - 15.2 seconds   INR 0.97 0.00 - 1.49  I-stat chem 8, ed     Status: Abnormal   Collection Time: 08/23/14  5:25 PM  Result Value Ref Range   Sodium 142 137 - 147 mEq/L   Potassium 3.6 (L) 3.7 - 5.3 mEq/L   Chloride 106 96 - 112 mEq/L   BUN 26 (H) 6 - 23 mg/dL   Creatinine, Ser 1.50 (H) 0.50 - 1.35 mg/dL   Glucose, Bld 112 (H) 70 - 99 mg/dL   Calcium, Ion 1.34 (H) 1.13 - 1.30 mmol/L   TCO2 23 0 - 100 mmol/L   Hemoglobin 17.0 13.0 - 17.0 g/dL   HCT 50.0 39.0 - 52.0 %  Comprehensive metabolic panel     Status: Abnormal   Collection Time: 08/23/14  5:30 PM  Result Value Ref Range   Sodium 142 137 - 147 mEq/L   Potassium 3.8 3.7 - 5.3 mEq/L   Chloride 103 96 - 112 mEq/L   CO2 24 19 - 32 mEq/L   Glucose, Bld 107 (H) 70 - 99 mg/dL   BUN 24 (H) 6 - 23 mg/dL   Creatinine, Ser 1.44 (H) 0.50 - 1.35 mg/dL   Calcium 10.9 (H) 8.4 - 10.5 mg/dL   Total Protein 7.5 6.0 - 8.3 g/dL   Albumin 3.8 3.5 - 5.2 g/dL   AST 22 0 - 37 U/L   ALT 30 0 - 53 U/L   Alkaline Phosphatase 70 39 - 117 U/L   Total Bilirubin 0.4 0.3 - 1.2 mg/dL   GFR calc non Af Amer 50 (L) >90 mL/min   GFR calc Af Amer 57 (L) >90 mL/min    Comment: (NOTE) The eGFR has been calculated using the CKD EPI equation. This calculation has not been validated in all  clinical situations. eGFR's persistently <90 mL/min signify possible Chronic Kidney Disease.    Anion gap 15 5 - 15  Ethanol     Status: None   Collection  Time: 08/23/14  5:30 PM  Result Value Ref Range   Alcohol, Ethyl (B) <11 0 - 11 mg/dL    Comment:        LOWEST DETECTABLE LIMIT FOR SERUM ALCOHOL IS 11 mg/dL FOR MEDICAL PURPOSES ONLY     Dg Forearm Right  08/23/2014   CLINICAL DATA:  Fall, left forearm pain  EXAM: RIGHT FOREARM - 2 VIEW  COMPARISON:  None.  FINDINGS: There is no evidence of fracture or other focal bone lesions. Soft tissues are unremarkable.  IMPRESSION: Negative.   Electronically Signed   By: Conchita Paris M.D.   On: 08/23/2014 18:06   Ct Head Wo Contrast  08/23/2014   CLINICAL DATA:  Initial evaluation after falling off a ladder, loss of consciousness, abrasion to top of forehead and back of head, complaining of neck pain  EXAM: CT HEAD WITHOUT CONTRAST  CT CERVICAL SPINE WITHOUT CONTRAST  TECHNIQUE: Multidetector CT imaging of the head and cervical spine was performed following the standard protocol without intravenous contrast. Multiplanar CT image reconstructions of the cervical spine were also generated.  COMPARISON:  None.  FINDINGS: CT HEAD FINDINGS  Moderate age-related atrophy. No hemorrhage or extra-axial fluid. No infarct or mass. No hydrocephalus. Calvarium is intact. No evidence of skull fracture.  CT CERVICAL SPINE FINDINGS  No prevertebral soft tissue swelling. Normal alignment. There is C5-6 anterior compression plate with fusion. There is a mild to moderate disc osteophytic bulge at C4-5, and a large disc osteophytic bulge at C6-7.  There is a minimally displaced fracture involving the superior articular facets of C7 on the right. No other fractures identified.  IMPRESSION: No acute intracranial abnormality. Unilateral right C7 superior facet fracture. Critical Value/emergent results were called by telephone at the time of interpretation on 08/23/2014 at 8:00 pm to Dr. Shirlyn Goltz , who verbally acknowledged these results.   Electronically Signed   By: Skipper Cliche M.D.   On: 08/23/2014 20:01   Ct Chest W  Contrast  08/23/2014   CLINICAL DATA:  Patient status post fall from ladder. Positive loss of consciousness.  EXAM: CT CHEST, ABDOMEN, AND PELVIS WITH CONTRAST  TECHNIQUE: Multidetector CT imaging of the chest, abdomen and pelvis was performed following the standard protocol during bolus administration of intravenous contrast.  CONTRAST:  73m OMNIPAQUE IOHEXOL 300 MG/ML  SOLN  COMPARISON:  Chest CT 01/29/2014  FINDINGS: CT CHEST FINDINGS  Visualized thyroid is unremarkable. Normal heart size. No pericardial effusion. Aorta and main pulmonary artery are normal in caliber. Central airways are patent. 4 mm left lower lobe nodule (image 46) is stable. 5 mm left lower lobe nodule (image 43) is stable. Minimal dependent ground-glass opacity within the left lower lobe. 3 mm right lower lobe nodule (image 35) is stable. Unchanged platelike atelectasis involving the right middle lobe. No definite pleural effusion or pneumothorax.  CT ABDOMEN AND PELVIS FINDINGS  Liver is normal in size and contour without focal hepatic lesion identified. Small calcification within the left hepatic lobe. Calcified granulomata within the spleen. Normal adrenal glands.  Multiple cysts within the right kidney with the largest measuring up to 3 cm, exophytic off the inferior portion. No hydronephrosis. The left kidney is not visualized. Normal caliber abdominal aorta. No retroperitoneal lymphadenopathy. Fat containing bilateral inguinal hernias. Prostate unremarkable. Urinary bladder  is unremarkable.  Sigmoid colonic diverticulosis without CT evidence for acute diverticulitis. The appendix is normal. No abnormal bowel wall thickening or evidence for bowel obstruction. No free fluid or free intraperitoneal air.  Nondisplaced fracture through the lateral aspect of the right fifth rib. Postsurgical change proximal left femur.  IMPRESSION: Nondisplaced fracture of the lateral aspect of the right fifth rib.  Otherwise no acute traumatic injury  within the chest, abdomen or pelvis.  Multiple bilateral pulmonary nodules, the largest of which measures 5 mm within the left lower lobe. Recommend additional follow-up chest CT in 12 months to ensure stability.   Electronically Signed   By: Lovey Newcomer M.D.   On: 08/23/2014 20:09   Ct Cervical Spine Wo Contrast  08/23/2014   CLINICAL DATA:  Initial evaluation after falling off a ladder, loss of consciousness, abrasion to top of forehead and back of head, complaining of neck pain  EXAM: CT HEAD WITHOUT CONTRAST  CT CERVICAL SPINE WITHOUT CONTRAST  TECHNIQUE: Multidetector CT imaging of the head and cervical spine was performed following the standard protocol without intravenous contrast. Multiplanar CT image reconstructions of the cervical spine were also generated.  COMPARISON:  None.  FINDINGS: CT HEAD FINDINGS  Moderate age-related atrophy. No hemorrhage or extra-axial fluid. No infarct or mass. No hydrocephalus. Calvarium is intact. No evidence of skull fracture.  CT CERVICAL SPINE FINDINGS  No prevertebral soft tissue swelling. Normal alignment. There is C5-6 anterior compression plate with fusion. There is a mild to moderate disc osteophytic bulge at C4-5, and a large disc osteophytic bulge at C6-7.  There is a minimally displaced fracture involving the superior articular facets of C7 on the right. No other fractures identified.  IMPRESSION: No acute intracranial abnormality. Unilateral right C7 superior facet fracture. Critical Value/emergent results were called by telephone at the time of interpretation on 08/23/2014 at 8:00 pm to Dr. Shirlyn Goltz , who verbally acknowledged these results.   Electronically Signed   By: Skipper Cliche M.D.   On: 08/23/2014 20:01   Ct Abdomen Pelvis W Contrast  08/23/2014   CLINICAL DATA:  Patient status post fall from ladder. Positive loss of consciousness.  EXAM: CT CHEST, ABDOMEN, AND PELVIS WITH CONTRAST  TECHNIQUE: Multidetector CT imaging of the chest, abdomen and  pelvis was performed following the standard protocol during bolus administration of intravenous contrast.  CONTRAST:  9m OMNIPAQUE IOHEXOL 300 MG/ML  SOLN  COMPARISON:  Chest CT 01/29/2014  FINDINGS: CT CHEST FINDINGS  Visualized thyroid is unremarkable. Normal heart size. No pericardial effusion. Aorta and main pulmonary artery are normal in caliber. Central airways are patent. 4 mm left lower lobe nodule (image 46) is stable. 5 mm left lower lobe nodule (image 43) is stable. Minimal dependent ground-glass opacity within the left lower lobe. 3 mm right lower lobe nodule (image 35) is stable. Unchanged platelike atelectasis involving the right middle lobe. No definite pleural effusion or pneumothorax.  CT ABDOMEN AND PELVIS FINDINGS  Liver is normal in size and contour without focal hepatic lesion identified. Small calcification within the left hepatic lobe. Calcified granulomata within the spleen. Normal adrenal glands.  Multiple cysts within the right kidney with the largest measuring up to 3 cm, exophytic off the inferior portion. No hydronephrosis. The left kidney is not visualized. Normal caliber abdominal aorta. No retroperitoneal lymphadenopathy. Fat containing bilateral inguinal hernias. Prostate unremarkable. Urinary bladder is unremarkable.  Sigmoid colonic diverticulosis without CT evidence for acute diverticulitis. The appendix is normal. No abnormal bowel  wall thickening or evidence for bowel obstruction. No free fluid or free intraperitoneal air.  Nondisplaced fracture through the lateral aspect of the right fifth rib. Postsurgical change proximal left femur.  IMPRESSION: Nondisplaced fracture of the lateral aspect of the right fifth rib.  Otherwise no acute traumatic injury within the chest, abdomen or pelvis.  Multiple bilateral pulmonary nodules, the largest of which measures 5 mm within the left lower lobe. Recommend additional follow-up chest CT in 12 months to ensure stability.   Electronically  Signed   By: Lovey Newcomer M.D.   On: 08/23/2014 20:09   Dg Pelvis Portable  08/23/2014   CLINICAL DATA:  Fall  EXAM: PORTABLE PELVIS 1-2 VIEWS  COMPARISON:  None.  FINDINGS: There is no evidence of pelvic fracture or diastasis. No pelvic bone lesions are seen. Clip projects over the left upper pelvis.  IMPRESSION: Negative.   Electronically Signed   By: Conchita Paris M.D.   On: 08/23/2014 18:04   Dg Chest Port 1 View  08/23/2014   CLINICAL DATA:  Fall, pain and laceration to the right knee and forearm  EXAM: PORTABLE CHEST - 1 VIEW  COMPARISON:  Chest CT 01/29/2014 and chest radiograph 10/23/2011  FINDINGS: Heart size at upper limits of normal. Cervical fusion hardware noted. Right basilar curvilinear scarring or atelectasis noted. Left basilar scarring is stable. No acute osseous abnormality.  IMPRESSION: Stable left basilar scarring. Right basilar scarring or atelectasis.   Electronically Signed   By: Conchita Paris M.D.   On: 08/23/2014 18:03   Dg Knee Complete 4 Views Right  08/23/2014   CLINICAL DATA:  Fall, left knee trauma  EXAM: RIGHT KNEE - COMPLETE 4+ VIEW  COMPARISON:  None.  FINDINGS: There is no evidence of fracture, dislocation, or joint effusion. There is no evidence of arthropathy or other focal bone abnormality. Soft tissues are unremarkable.  IMPRESSION: Negative.   Electronically Signed   By: Conchita Paris M.D.   On: 08/23/2014 18:08   Dg Humerus Right  08/23/2014   CLINICAL DATA:  Fall off ladder, upper forearm pain  EXAM: RIGHT HUMERUS - 2+ VIEW  COMPARISON:  None.  FINDINGS: No fracture or dislocation is seen.  Mild degenerative changes of the acromioclavicular joint.  The visualized soft tissues are unremarkable.  IMPRESSION: No fracture or dislocation is seen.   Electronically Signed   By: Julian Hy M.D.   On: 08/23/2014 20:44    Review of Systems - Negative except headaches,neck pain.   Blood pressure 108/77, pulse 100, temperature 97.9 F (36.6 C), temperature  source Oral, resp. rate 14, height 5' 7.5" (1.715 m), weight 83.915 kg (185 lb), SpO2 94 %. Physical Exam  Constitutional: He is oriented to person, place, and time. He appears well-developed and well-nourished.  HENT:  Head: Normocephalic. Head is with abrasion.    Right Ear: External ear normal.  Ears:  Eyes: Conjunctivae and EOM are normal. Pupils are equal, round, and reactive to light.  Neck:  In cervical collar  Cardiovascular: Normal rate and regular rhythm.   Respiratory: Effort normal and breath sounds normal.  GI: Bowel sounds are normal.  Neurological: He is alert and oriented to person, place, and time. No cranial nerve deficit. GCS eye subscore is 4. GCS verbal subscore is 5. GCS motor subscore is 6.  Reflex Scores:      Tricep reflexes are 0 on the right side and 2+ on the left side.      Bicep reflexes are  2+ on the right side and 2+ on the left side.      Brachioradialis reflexes are 2+ on the right side and 2+ on the left side.      Patellar reflexes are 2+ on the right side and 2+ on the left side.      Achilles reflexes are 2+ on the right side and 2+ on the left side. 4-/5 strength right triceps and wrist flexion.  Numbness in right C 7 distribution  Skin: Skin is warm and dry. Abrasion noted.  Psychiatric: He has a normal mood and affect. His speech is normal and behavior is normal. Judgment and thought content normal. Cognition and memory are normal.    Assessment/Plan: Patient has had a fall from a roof and had brief LOC.  He has an abrasion to his left external ear, rib fracture and right C7 superior articular process facet fracture.  He has C 56 fusion and has subluxation of C 6 on C 7.  He has significant right C 7 radiculopathy.  He is to be admitted for observation and will need MRI of cervical spine to assess for disc herniation, cord contusion, nerve root compression.  He may well require surgical decompression and fusion at the C 67 level.  I have  discussed my findings and recommendations with the patient, his wife, and their friend.  Peggyann Shoals, MD 08/23/2014, 9:06 PM

## 2014-08-23 NOTE — ED Notes (Signed)
Per EMS- Pt was on ladder cleaning out leaves in his gutters when he fell off a ladder initially a reported 15 feet by EMS, then 10 ft by family. There was positive loss of consciousness. GCS when EMS arrived 15. Fully immobilized. Abrasion to top of forehead, right post forearm, right knee, left lower lower, left ear, various abrasion to left back of head. BP 168/80, HR 108. Is a x 4 at current. C/o neck pain, and right upper arm pain.

## 2014-08-23 NOTE — H&P (Signed)
History   Jared [name] is an 63 y.o. male.   Chief Complaint:  Chief Complaint  Patient presents with  . Fall    Fall This is a new problem. The current episode started today. The problem has been unchanged. Associated symptoms include chest pain, headaches and neck pain. Associated symptoms comments: Right arm weakness. Exacerbated by: moving.   Pt fell 15 feet from ladder while cleaning gutters.  He had LOC and came to on his own.  He immediately had neck pain and noted that his right arm was weak.  He denies shortness of breath.  He has some mild right chest pain.  He denies nausea or vomiting.  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 Joya Salm.    Past Medical History  Diagnosis Date  . Asthma   . Allergic rhinitis   . Heart attack 2009  . Renal cell carcinoma 1997    Left kidney  . GERD (gastroesophageal reflux disease)   . IBS (irritable bowel syndrome)   . Diverticulosis   . History of esophageal stricture   . Internal hemorrhoids   . Depression   . Osteoarthritis   . Peripheral neuropathy   . Depression, prolonged 06/26/2013  . Hypertension   . Hyperlipidemia   . Coronary artery disease   . Sleep talking   . Snoring 12/25/2013  . 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 Cervical vertebral fusion 05/02/2014    Now presenting with C5-6 radiculopathy  . Shoulder pain, right     Past Surgical History  Procedure Laterality Date  . Back surgery  1992, 2013  . Neck surgery  2005  . Coronary stent placement    . Femur fracture surgery Left 1995  . Total knee arthroplasty Left 2005  . Carpal tunnel release Bilateral   . Nephrectomy Left     secondary to cancer  . Cardiac catheterization  05/10/2007    Minimal CAD, normal LV systolic function, medical management  . Cardiac catheterization  04/08/2008    RCA ulcerated 70-80% stenosis, stented with a 3x71m Endeavor stent at 13atm for 50sec, reduced from  80% ulcerated stenosis to 0%.  . Cardiac catheterization  05/07/2009    50% distal left main disease-IVUS or flow wire too dangerous in particular setting to perform intervention.  . Cardiac catheterization  03/18/2010    Medical management  . Cardiovascular stress test  02/09/2012    Normal, no significant wall abnormalities noted    Family History  Problem Relation Age of Onset  . Asthma Mother   . Heart disease Mother   . Hypertension Mother   . Heart disease Paternal Grandfather   . Heart attack Paternal Grandfather 557 . Esophageal cancer Father   . Barrett's esophagus Father   . Bone cancer Father     mets from esophagus  . Heart disease Father   . Colon cancer Maternal Uncle   . Diabetes Sister   . Cancer Sister     Cervical cancer   Social History:  reports that he has never smoked. He has never used smokeless tobacco. He reports that he does not drink alcohol or use illicit drugs.  Allergies   Allergies  Allergen Reactions  . 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 Nsaids Other (See Comments)    Was told not to take d/t  pt only having 1 kidney  . Penicillins Hives and Itching  . Sulfonamide Derivatives Hives and Itching  . Codeine Nausea Only  . Statins Other (See Comments)    Myalgias    Home Medications  Medications Outpatient Medications (16) Hospital Medications (0) Clinic-Administered Medications (0)   New medications from outside sources are available for reconciliation   albuterol (PROVENTIL HFA;VENTOLIN HFA) 108 (90 BASE) MCG/ACT inhaler    ARIPiprazole (ABILIFY) 5 MG tablet    aspirin 325 MG tablet    DULoxetine (CYMBALTA) 60 MG capsule    ezetimibe (ZETIA) 10 MG tablet    finasteride (PROSCAR) 5 MG tablet    fish oil-omega-3 fatty acids 1000 MG capsule    gemfibrozil (LOPID) 600 MG tablet    isosorbide mononitrate (IMDUR) 30 MG 24 hr tablet    levothyroxine  (SYNTHROID, LEVOTHROID) 25 MCG tablet    Multiple Vitamin (MULTIVITAMIN) tablet    nitroGLYCERIN (NITROSTAT) 0.4 MG SL tablet    silodosin (RAPAFLO) 8 MG CAPS capsule    vitamin C (ASCORBIC ACID) 500 MG tablet    omalizumab (XOLAIR) 150 MG injection    Suvorexant (BELSOMRA) 10 MG TABS      Trauma Course   Results for orders placed or performed during the hospital encounter of 08/23/14 (from the past 48 hour(s))  Sample to Blood Bank     Status: None   Collection Time: 08/23/14  5:10 PM  Result Value Ref Range   Blood Bank Specimen SAMPLE AVAILABLE FOR TESTING    Sample Expiration 08/24/2014   CDS serology     Status: None   Collection Time: 08/23/14  5:11 PM  Result Value Ref Range   CDS serology specimen      SPECIMEN WILL BE HELD FOR 14 DAYS IF TESTING IS REQUIRED  CBC     Status: None   Collection Time: 08/23/14  5:11 PM  Result Value Ref Range   WBC 8.5 4.0 - 10.5 K/uL   RBC 5.47 4.22 - 5.81 MIL/uL   Hemoglobin 15.9 13.0 - 17.0 g/dL   HCT 47.1 39.0 - 52.0 %   MCV 86.1 78.0 - 100.0 fL   MCH 29.1 26.0 - 34.0 pg   MCHC 33.8 30.0 - 36.0 g/dL   RDW 14.3 11.5 - 15.5 %   Platelets 227 150 - 400 K/uL  Protime-INR     Status: None   Collection Time: 08/23/14  5:11 PM  Result Value Ref Range   Prothrombin Time 13.0 11.6 - 15.2 seconds   INR 0.97 0.00 - 1.49  I-stat chem 8, ed     Status: Abnormal   Collection Time: 08/23/14  5:25 PM  Result Value Ref Range   Sodium 142 137 - 147 mEq/L   Potassium 3.6 (L) 3.7 - 5.3 mEq/L   Chloride 106 96 - 112 mEq/L   BUN 26 (H) 6 - 23 mg/dL   Creatinine, Ser 1.50 (H) 0.50 - 1.35 mg/dL   Glucose, Bld 112 (H) 70 - 99 mg/dL   Calcium, Ion 1.34 (H) 1.13 - 1.30 mmol/L   TCO2 23 0 - 100 mmol/L   Hemoglobin 17.0 13.0 - 17.0 g/dL   HCT 50.0 39.0 - 52.0 %  Comprehensive metabolic panel     Status: Abnormal   Collection Time: 08/23/14  5:30 PM  Result Value Ref Range   Sodium 142 137 - 147 mEq/L   Potassium 3.8 3.7 - 5.3 mEq/L    Chloride 103 96 - 112 mEq/L  CO2 24 19 - 32 mEq/L   Glucose, Bld 107 (H) 70 - 99 mg/dL   BUN 24 (H) 6 - 23 mg/dL   Creatinine, Ser 1.44 (H) 0.50 - 1.35 mg/dL   Calcium 10.9 (H) 8.4 - 10.5 mg/dL   Total Protein 7.5 6.0 - 8.3 g/dL   Albumin 3.8 3.5 - 5.2 g/dL   AST 22 0 - 37 U/L   ALT 30 0 - 53 U/L   Alkaline Phosphatase 70 39 - 117 U/L   Total Bilirubin 0.4 0.3 - 1.2 mg/dL   GFR calc non Af Amer 50 (L) >90 mL/min   GFR calc Af Amer 57 (L) >90 mL/min    Comment: (NOTE) The eGFR has been calculated using the CKD EPI equation. This calculation has not been validated in all clinical situations. eGFR's persistently <90 mL/min signify possible Chronic Kidney Disease.    Anion gap 15 5 - 15  Ethanol     Status: None   Collection Time: 08/23/14  5:30 PM  Result Value Ref Range   Alcohol, Ethyl (B) <11 0 - 11 mg/dL    Comment:        LOWEST DETECTABLE LIMIT FOR SERUM ALCOHOL IS 11 mg/dL FOR MEDICAL PURPOSES ONLY    Dg Forearm Right  08/23/2014   CLINICAL DATA:  Fall, left forearm pain  EXAM: RIGHT FOREARM - 2 VIEW  COMPARISON:  None.  FINDINGS: There is no evidence of fracture or other focal bone lesions. Soft tissues are unremarkable.  IMPRESSION: Negative.   Electronically Signed   By: Conchita Paris M.D.   On: 08/23/2014 18:06   Ct Head Wo Contrast  08/23/2014   CLINICAL DATA:  Initial evaluation after falling off a ladder, loss of consciousness, abrasion to top of forehead and back of head, complaining of neck pain  EXAM: CT HEAD WITHOUT CONTRAST  CT CERVICAL SPINE WITHOUT CONTRAST  TECHNIQUE: Multidetector CT imaging of the head and cervical spine was performed following the standard protocol without intravenous contrast. Multiplanar CT image reconstructions of the cervical spine were also generated.  COMPARISON:  None.  FINDINGS: CT HEAD FINDINGS  Moderate age-related atrophy. No hemorrhage or extra-axial fluid. No infarct or mass. No hydrocephalus. Calvarium is intact. No evidence  of skull fracture.  CT CERVICAL SPINE FINDINGS  No prevertebral soft tissue swelling. Normal alignment. There is C5-6 anterior compression plate with fusion. There is a mild to moderate disc osteophytic bulge at C4-5, and a large disc osteophytic bulge at C6-7.  There is a minimally displaced fracture involving the superior articular facets of C7 on the right. No other fractures identified.  IMPRESSION: No acute intracranial abnormality. Unilateral right C7 superior facet fracture. Critical Value/emergent results were called by telephone at the time of interpretation on 08/23/2014 at 8:00 pm to Dr. Shirlyn Goltz , who verbally acknowledged these results.   Electronically Signed   By: Skipper Cliche M.D.   On: 08/23/2014 20:01   Ct Chest W Contrast  08/23/2014   CLINICAL DATA:  Patient status post fall from ladder. Positive loss of consciousness.  EXAM: CT CHEST, ABDOMEN, AND PELVIS WITH CONTRAST  TECHNIQUE: Multidetector CT imaging of the chest, abdomen and pelvis was performed following the standard protocol during bolus administration of intravenous contrast.  CONTRAST:  68m OMNIPAQUE IOHEXOL 300 MG/ML  SOLN  COMPARISON:  Chest CT 01/29/2014  FINDINGS: CT CHEST FINDINGS  Visualized thyroid is unremarkable. Normal heart size. No pericardial effusion. Aorta and main pulmonary artery are normal  in caliber. Central airways are patent. 4 mm left lower lobe nodule (image 46) is stable. 5 mm left lower lobe nodule (image 43) is stable. Minimal dependent ground-glass opacity within the left lower lobe. 3 mm right lower lobe nodule (image 35) is stable. Unchanged platelike atelectasis involving the right middle lobe. No definite pleural effusion or pneumothorax.  CT ABDOMEN AND PELVIS FINDINGS  Liver is normal in size and contour without focal hepatic lesion identified. Small calcification within the left hepatic lobe. Calcified granulomata within the spleen. Normal adrenal glands.  Multiple cysts within the right kidney  with the largest measuring up to 3 cm, exophytic off the inferior portion. No hydronephrosis. The left kidney is not visualized. Normal caliber abdominal aorta. No retroperitoneal lymphadenopathy. Fat containing bilateral inguinal hernias. Prostate unremarkable. Urinary bladder is unremarkable.  Sigmoid colonic diverticulosis without CT evidence for acute diverticulitis. The appendix is normal. No abnormal bowel wall thickening or evidence for bowel obstruction. No free fluid or free intraperitoneal air.  Nondisplaced fracture through the lateral aspect of the right fifth rib. Postsurgical change proximal left femur.  IMPRESSION: Nondisplaced fracture of the lateral aspect of the right fifth rib.  Otherwise no acute traumatic injury within the chest, abdomen or pelvis.  Multiple bilateral pulmonary nodules, the largest of which measures 5 mm within the left lower lobe. Recommend additional follow-up chest CT in 12 months to ensure stability.   Electronically Signed   By: Lovey Newcomer M.D.   On: 08/23/2014 20:09   Ct Cervical Spine Wo Contrast  08/23/2014   CLINICAL DATA:  Initial evaluation after falling off a ladder, loss of consciousness, abrasion to top of forehead and back of head, complaining of neck pain  EXAM: CT HEAD WITHOUT CONTRAST  CT CERVICAL SPINE WITHOUT CONTRAST  TECHNIQUE: Multidetector CT imaging of the head and cervical spine was performed following the standard protocol without intravenous contrast. Multiplanar CT image reconstructions of the cervical spine were also generated.  COMPARISON:  None.  FINDINGS: CT HEAD FINDINGS  Moderate age-related atrophy. No hemorrhage or extra-axial fluid. No infarct or mass. No hydrocephalus. Calvarium is intact. No evidence of skull fracture.  CT CERVICAL SPINE FINDINGS  No prevertebral soft tissue swelling. Normal alignment. There is C5-6 anterior compression plate with fusion. There is a mild to moderate disc osteophytic bulge at C4-5, and a large disc  osteophytic bulge at C6-7.  There is a minimally displaced fracture involving the superior articular facets of C7 on the right. No other fractures identified.  IMPRESSION: No acute intracranial abnormality. Unilateral right C7 superior facet fracture. Critical Value/emergent results were called by telephone at the time of interpretation on 08/23/2014 at 8:00 pm to Dr. Shirlyn Goltz , who verbally acknowledged these results.   Electronically Signed   By: Skipper Cliche M.D.   On: 08/23/2014 20:01   Ct Abdomen Pelvis W Contrast  08/23/2014   CLINICAL DATA:  Patient status post fall from ladder. Positive loss of consciousness.  EXAM: CT CHEST, ABDOMEN, AND PELVIS WITH CONTRAST  TECHNIQUE: Multidetector CT imaging of the chest, abdomen and pelvis was performed following the standard protocol during bolus administration of intravenous contrast.  CONTRAST:  59m OMNIPAQUE IOHEXOL 300 MG/ML  SOLN  COMPARISON:  Chest CT 01/29/2014  FINDINGS: CT CHEST FINDINGS  Visualized thyroid is unremarkable. Normal heart size. No pericardial effusion. Aorta and main pulmonary artery are normal in caliber. Central airways are patent. 4 mm left lower lobe nodule (image 46) is stable. 5 mm left  lower lobe nodule (image 43) is stable. Minimal dependent ground-glass opacity within the left lower lobe. 3 mm right lower lobe nodule (image 35) is stable. Unchanged platelike atelectasis involving the right middle lobe. No definite pleural effusion or pneumothorax.  CT ABDOMEN AND PELVIS FINDINGS  Liver is normal in size and contour without focal hepatic lesion identified. Small calcification within the left hepatic lobe. Calcified granulomata within the spleen. Normal adrenal glands.  Multiple cysts within the right kidney with the largest measuring up to 3 cm, exophytic off the inferior portion. No hydronephrosis. The left kidney is not visualized. Normal caliber abdominal aorta. No retroperitoneal lymphadenopathy. Fat containing bilateral  inguinal hernias. Prostate unremarkable. Urinary bladder is unremarkable.  Sigmoid colonic diverticulosis without CT evidence for acute diverticulitis. The appendix is normal. No abnormal bowel wall thickening or evidence for bowel obstruction. No free fluid or free intraperitoneal air.  Nondisplaced fracture through the lateral aspect of the right fifth rib. Postsurgical change proximal left femur.  IMPRESSION: Nondisplaced fracture of the lateral aspect of the right fifth rib.  Otherwise no acute traumatic injury within the chest, abdomen or pelvis.  Multiple bilateral pulmonary nodules, the largest of which measures 5 mm within the left lower lobe. Recommend additional follow-up chest CT in 12 months to ensure stability.   Electronically Signed   By: Lovey Newcomer M.D.   On: 08/23/2014 20:09   Dg Pelvis Portable  08/23/2014   CLINICAL DATA:  Fall  EXAM: PORTABLE PELVIS 1-2 VIEWS  COMPARISON:  None.  FINDINGS: There is no evidence of pelvic fracture or diastasis. No pelvic bone lesions are seen. Clip projects over the left upper pelvis.  IMPRESSION: Negative.   Electronically Signed   By: Conchita Paris M.D.   On: 08/23/2014 18:04   Dg Chest Port 1 View  08/23/2014   CLINICAL DATA:  Fall, pain and laceration to the right knee and forearm  EXAM: PORTABLE CHEST - 1 VIEW  COMPARISON:  Chest CT 01/29/2014 and chest radiograph 10/23/2011  FINDINGS: Heart size at upper limits of normal. Cervical fusion hardware noted. Right basilar curvilinear scarring or atelectasis noted. Left basilar scarring is stable. No acute osseous abnormality.  IMPRESSION: Stable left basilar scarring. Right basilar scarring or atelectasis.   Electronically Signed   By: Conchita Paris M.D.   On: 08/23/2014 18:03   Dg Knee Complete 4 Views Right  08/23/2014   CLINICAL DATA:  Fall, left knee trauma  EXAM: RIGHT KNEE - COMPLETE 4+ VIEW  COMPARISON:  None.  FINDINGS: There is no evidence of fracture, dislocation, or joint effusion. There  is no evidence of arthropathy or other focal bone abnormality. Soft tissues are unremarkable.  IMPRESSION: Negative.   Electronically Signed   By: Conchita Paris M.D.   On: 08/23/2014 18:08   Dg Humerus Right  08/23/2014   CLINICAL DATA:  Fall off ladder, upper forearm pain  EXAM: RIGHT HUMERUS - 2+ VIEW  COMPARISON:  None.  FINDINGS: No fracture or dislocation is seen.  Mild degenerative changes of the acromioclavicular joint.  The visualized soft tissues are unremarkable.  IMPRESSION: No fracture or dislocation is seen.   Electronically Signed   By: Julian Hy M.D.   On: 08/23/2014 20:44    Review of Systems  Constitutional: Negative.   HENT: Positive for ear pain.   Eyes: Negative.   Respiratory: Negative.   Cardiovascular: Positive for chest pain.  Gastrointestinal: Negative.   Genitourinary: Positive for frequency.  Musculoskeletal: Positive for neck  pain.  Skin: Negative.   Neurological: Positive for headaches.  Endo/Heme/Allergies: Negative.   Psychiatric/Behavioral: Negative.     Blood pressure 133/99, pulse 102, temperature 97.9 F (36.6 C), temperature source Oral, resp. rate 16, height 5' 7.5" (1.715 m), weight 185 lb (83.915 kg), SpO2 93 %. Physical Exam  Constitutional: He is oriented to person, place, and time. He appears well-developed and well-nourished. He appears distressed (looks uncomfortable). Cervical collar in place.  HENT:  Head: Head is with abrasion.    Right Ear: External ear normal.  Left Ear: Left ear exhibits lacerations.  Ears:  Tiny laceration at point where superior helix meets scalp.  Abrasion over left pinna  Eyes: Conjunctivae and EOM are normal. Pupils are equal, round, and reactive to light. Right eye exhibits no discharge. Left eye exhibits no discharge. No scleral icterus.  Neck: Neck supple. No tracheal deviation present.  Cardiovascular: Normal rate, regular rhythm and intact distal pulses.   Respiratory: Effort normal. No  respiratory distress. He exhibits tenderness (mild right chest wall tenderness).  GI: Soft. He exhibits no distension. There is no tenderness. There is no rebound and no guarding.  Musculoskeletal: He exhibits no edema or tenderness.       Right shoulder: He exhibits decreased strength.  Neurological: He is alert and oriented to person, place, and time.  Skin: Skin is warm and dry. No rash noted. He is not diaphoretic. No erythema. No pallor.  Psychiatric: His behavior is normal. Judgment and thought content normal.  Appears appropriately slightly anxious     Assessment/Plan Fall Concussion Right C7 superior facet fracture with arm weakness. Right 5th rib fracture Abrasions to scalp and left ear CAD Hypothyroidism Depression  Dr. [name] evaluated already.  Ordering MR cervical spine.  May need surgery again. Supportive care, neuro checks Pain control Pulmonary toilet Bacitracin to lacerations Continue abilify and cymbalta Continue synthroid PRN nitroglycerin, daily imdur   Chiana Wamser 08/23/2014, 10:16 PM   Procedures

## 2014-08-23 NOTE — Progress Notes (Signed)
   08/23/14 2000  Clinical Encounter Type  Visited With Family;Health care provider  Visit Type Initial;ED;Trauma   Chaplain responded to a level 2 trauma page at 5:05PM. When chaplain arrived patient's wife and patient's neighbor were already present. They explained that the patient had been cleaning leaves from the gutter when he fell off the ladder. Patient's neighbors were home and were able to get help. Patient's family was not distressed but were supportive and awaiting to see the patient after his X-Rays were completed. Chaplain will continue to provide emotional and spiritual support for patient and patient's family as needed. Alizea Pell, Claudius Sis, Chaplain  8:40 PM

## 2014-08-23 NOTE — ED Notes (Signed)
Pt has skin tear to right forearm; abrasion to right knee; abrasion below left knee; laceration to top of head at hairline; laceration to left ear; various lacerations to back of head.

## 2014-08-23 NOTE — ED Provider Notes (Signed)
CSN: [id number]     Arrival date & time 08/23/14  1637 History   First MD Initiated Contact with Patient 08/23/14 1642     Chief Complaint  Patient presents with  . Fall     (Consider location/radiation/quality/duration/timing/severity/associated sxs/prior Treatment) The history is provided by the patient.  Jared [name] is a 63 y.o. male hx of renal cell carcinoma s/p L nephrectomy here with fall. He was up on the ladder cleaning the gutter today. He states he is maybe 10-15 feet off the ground. States that the ladder gave out under him and he fell. He had loss of consciousness was laying on the floor. He is complaining of neck pain and right arm pain. Was noted to have multiple abrasions, tetanus up to date.    Past Medical History  Diagnosis Date  . Asthma   . Allergic rhinitis   . Heart attack 2009  . Renal cell carcinoma 1997    Left kidney  . GERD (gastroesophageal reflux disease)   . IBS (irritable bowel syndrome)   . Diverticulosis   . History of esophageal stricture   . Internal hemorrhoids   . Depression   . Osteoarthritis   . Peripheral neuropathy   . Depression, prolonged 06/26/2013  . Hypertension   . Hyperlipidemia   . Coronary artery disease   . Sleep talking   . Snoring 12/25/2013  . 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 Cervical vertebral fusion 05/02/2014    Now presenting with C5-6 radiculopathy  . Shoulder pain, right    Past Surgical History  Procedure Laterality Date  . Back surgery  1992, 2013  . Neck surgery  2005  . Coronary stent placement    . Femur fracture surgery Left 1995  . Total knee arthroplasty Left 2005  . Carpal tunnel release Bilateral   . Nephrectomy Left     secondary to cancer  . Cardiac catheterization  05/10/2007    Minimal CAD, normal LV systolic function, medical management  . Cardiac catheterization  04/08/2008    RCA ulcerated 70-80% stenosis, stented with a 3x19mm Endeavor stent at 13atm for 50sec, reduced  from 80% ulcerated stenosis to 0%.  . Cardiac catheterization  05/07/2009    50% distal left main disease-IVUS or flow wire too dangerous in particular setting to perform intervention.  . Cardiac catheterization  03/18/2010    Medical management  . Cardiovascular stress test  02/09/2012    Normal, no significant wall abnormalities noted   Family History  Problem Relation Age of Onset  . Asthma Mother   . Heart disease Mother   . Hypertension Mother   . Heart disease Paternal Grandfather   . Heart attack Paternal Grandfather 68  . Esophageal cancer Father   . Barrett's esophagus Father   . Bone cancer Father     mets from esophagus  . Heart disease Father   . Colon cancer Maternal Uncle   . Diabetes Sister   . Cancer Sister     Cervical cancer   History  Substance Use Topics  . Smoking status: Never Smoker   . Smokeless tobacco: Never Used  . Alcohol Use: No    Review of Systems  Musculoskeletal: Positive for neck pain.  Skin: Positive for wound.  Neurological: Positive for headaches.  All other systems reviewed and are negative.     Allergies  Contrast media; Nsaids; Penicillins; Sulfonamide derivatives; Codeine; and Statins  Home Medications   Prior to Admission medications  Medication Sig Start Date End Date Taking? Authorizing Provider  albuterol (PROVENTIL HFA;VENTOLIN HFA) 108 (90 BASE) MCG/ACT inhaler Inhale 2 puffs into the lungs every 6 (six) hours as needed for wheezing or shortness of breath.   Yes Historical Provider, MD  ARIPiprazole (ABILIFY) 5 MG tablet Take 5 mg by mouth daily.   Yes Historical Provider, MD  aspirin 325 MG tablet Take 325 mg by mouth daily.   Yes Historical Provider, MD  DULoxetine (CYMBALTA) 60 MG capsule Take 60 mg by mouth daily.   Yes Historical Provider, MD  ezetimibe (ZETIA) 10 MG tablet Take 10 mg by mouth daily.   Yes Historical Provider, MD  finasteride (PROSCAR) 5 MG tablet Take 5 mg by mouth at bedtime.    Yes Historical  Provider, MD  fish oil-omega-3 fatty acids 1000 MG capsule Take 1 g by mouth 2 (two) times daily.    Yes Historical Provider, MD  gemfibrozil (LOPID) 600 MG tablet Take 600 mg by mouth 2 (two) times daily.   Yes Historical Provider, MD  isosorbide mononitrate (IMDUR) 30 MG 24 hr tablet Take 1 tablet (30 mg total) by mouth daily. 02/17/14  Yes Lorretta Harp, MD  levothyroxine (SYNTHROID, LEVOTHROID) 25 MCG tablet Take 25 mcg by mouth 3 x daily with food.    Yes Historical Provider, MD  Multiple Vitamin (MULTIVITAMIN) tablet Take 1 tablet by mouth daily.   Yes Historical Provider, MD  nitroGLYCERIN (NITROSTAT) 0.4 MG SL tablet Place 1 tablet (0.4 mg total) under the tongue every 5 (five) minutes as needed for chest pain. 06/25/13  Yes Isaiah Serge, NP  silodosin (RAPAFLO) 8 MG CAPS capsule Take 8 mg by mouth at bedtime.    Yes Historical Provider, MD  vitamin C (ASCORBIC ACID) 500 MG tablet Take 500 mg by mouth 2 (two) times daily.    Yes Historical Provider, MD  omalizumab [name]) 150 MG injection Inject into the skin every 30 (thirty) days.     Historical Provider, MD  Suvorexant (BELSOMRA) 10 MG TABS Take 10 mg by mouth Nightly. Patient not taking: Reported on 08/23/2014 05/02/14   [name] Dohmeier, MD   BP 143/85 mmHg  Pulse 100  Temp(Src) 97.9 F (36.6 C) (Oral)  Resp 21  Ht 5' 7.5" (1.715 m)  Wt 185 lb (83.915 kg)  BMI 28.53 kg/m2  SpO2 96% Physical Exam  Constitutional: He is oriented to person, place, and time.  Uncomfortable   HENT:  Multiple scalp abrasions. Small laceration superior aspect of L ear lobe.   Eyes: Conjunctivae are normal. Pupils are equal, round, and reactive to light.  Neck:  C collar in place   Cardiovascular: Normal rate, regular rhythm and normal heart sounds.   Pulmonary/Chest: Effort normal and breath sounds normal. No respiratory distress. He has no wheezes. He has no rales.  Abdominal: Soft. Bowel sounds are normal. He exhibits no distension. There is  no tenderness. There is no rebound and no guarding.  Musculoskeletal: Normal range of motion. He exhibits no edema or tenderness.  No obvious midline spinal tenderness. Pelvis stable. Multiple skin tears R forearm. Mild tenderness R forearm and humerus. Abrasions bilateral lower extremities. Mild R knee swelling and tenderness.   Neurological: He is alert and oriented to person, place, and time. No cranial nerve deficit. Coordination normal.  Skin: Skin is warm and dry.  Psychiatric: He has a normal mood and affect. His behavior is normal. Judgment and thought content normal.  Nursing note and vitals reviewed.  ED Course  Procedures (including critical care time)  LACERATION REPAIR Performed by: Shirlyn Goltz Authorized by: Shirlyn Goltz Consent: Verbal consent obtained. Risks and benefits: risks, benefits and alternatives were discussed Consent given by: patient Patient identity confirmed: provided demographic data Prepped and Draped in normal sterile fashion Wound explored  Laceration Location: L ear  Laceration Length: 1 cm  No Foreign Bodies seen or palpated   Local anesthetic: none   Irrigation method: syringe Amount of cleaning: standard  Skin closure: dermabond  Patient tolerance: Patient tolerated the procedure well with no immediate complications.   Labs Review Labs Reviewed  COMPREHENSIVE METABOLIC PANEL - Abnormal; Notable for the following:    Glucose, Bld 107 (*)    BUN 24 (*)    Creatinine, Ser 1.44 (*)    Calcium 10.9 (*)    GFR calc non Af Amer 50 (*)    GFR calc Af Amer 57 (*)    All other components within normal limits  I-STAT CHEM 8, ED - Abnormal; Notable for the following:    Potassium 3.6 (*)    BUN 26 (*)    Creatinine, Ser 1.50 (*)    Glucose, Bld 112 (*)    Calcium, Ion 1.34 (*)    All other components within normal limits  CDS SEROLOGY  CBC  ETHANOL  PROTIME-INR  SAMPLE TO BLOOD BANK    Imaging Review Dg Forearm Right  08/23/2014    CLINICAL DATA:  Fall, left forearm pain  EXAM: RIGHT FOREARM - 2 VIEW  COMPARISON:  None.  FINDINGS: There is no evidence of fracture or other focal bone lesions. Soft tissues are unremarkable.  IMPRESSION: Negative.   Electronically Signed   By: Conchita Paris M.D.   On: 08/23/2014 18:06   Ct Head Wo Contrast  08/23/2014   CLINICAL DATA:  Initial evaluation after falling off a ladder, loss of consciousness, abrasion to top of forehead and back of head, complaining of neck pain  EXAM: CT HEAD WITHOUT CONTRAST  CT CERVICAL SPINE WITHOUT CONTRAST  TECHNIQUE: Multidetector CT imaging of the head and cervical spine was performed following the standard protocol without intravenous contrast. Multiplanar CT image reconstructions of the cervical spine were also generated.  COMPARISON:  None.  FINDINGS: CT HEAD FINDINGS  Moderate age-related atrophy. No hemorrhage or extra-axial fluid. No infarct or mass. No hydrocephalus. Calvarium is intact. No evidence of skull fracture.  CT CERVICAL SPINE FINDINGS  No prevertebral soft tissue swelling. Normal alignment. There is C5-6 anterior compression plate with fusion. There is a mild to moderate disc osteophytic bulge at C4-5, and a large disc osteophytic bulge at C6-7.  There is a minimally displaced fracture involving the superior articular facets of C7 on the right. No other fractures identified.  IMPRESSION: No acute intracranial abnormality. Unilateral right C7 superior facet fracture. Critical Value/emergent results were called by telephone at the time of interpretation on 08/23/2014 at 8:00 pm to Dr. Shirlyn Goltz , who verbally acknowledged these results.   Electronically Signed   By: Skipper Cliche M.D.   On: 08/23/2014 20:01   Ct Chest W Contrast  08/23/2014   CLINICAL DATA:  Patient status post fall from ladder. Positive loss of consciousness.  EXAM: CT CHEST, ABDOMEN, AND PELVIS WITH CONTRAST  TECHNIQUE: Multidetector CT imaging of the chest, abdomen and pelvis was  performed following the standard protocol during bolus administration of intravenous contrast.  CONTRAST:  50mL OMNIPAQUE IOHEXOL 300 MG/ML  SOLN  COMPARISON:  Chest CT  01/29/2014  FINDINGS: CT CHEST FINDINGS  Visualized thyroid is unremarkable. Normal heart size. No pericardial effusion. Aorta and main pulmonary artery are normal in caliber. Central airways are patent. 4 mm left lower lobe nodule (image 46) is stable. 5 mm left lower lobe nodule (image 43) is stable. Minimal dependent ground-glass opacity within the left lower lobe. 3 mm right lower lobe nodule (image 35) is stable. Unchanged platelike atelectasis involving the right middle lobe. No definite pleural effusion or pneumothorax.  CT ABDOMEN AND PELVIS FINDINGS  Liver is normal in size and contour without focal hepatic lesion identified. Small calcification within the left hepatic lobe. Calcified granulomata within the spleen. Normal adrenal glands.  Multiple cysts within the right kidney with the largest measuring up to 3 cm, exophytic off the inferior portion. No hydronephrosis. The left kidney is not visualized. Normal caliber abdominal aorta. No retroperitoneal lymphadenopathy. Fat containing bilateral inguinal hernias. Prostate unremarkable. Urinary bladder is unremarkable.  Sigmoid colonic diverticulosis without CT evidence for acute diverticulitis. The appendix is normal. No abnormal bowel wall thickening or evidence for bowel obstruction. No free fluid or free intraperitoneal air.  Nondisplaced fracture through the lateral aspect of the right fifth rib. Postsurgical change proximal left femur.  IMPRESSION: Nondisplaced fracture of the lateral aspect of the right fifth rib.  Otherwise no acute traumatic injury within the chest, abdomen or pelvis.  Multiple bilateral pulmonary nodules, the largest of which measures 5 mm within the left lower lobe. Recommend additional follow-up chest CT in 12 months to ensure stability.   Electronically Signed    By: Lovey Newcomer M.D.   On: 08/23/2014 20:09   Ct Cervical Spine Wo Contrast  08/23/2014   CLINICAL DATA:  Initial evaluation after falling off a ladder, loss of consciousness, abrasion to top of forehead and back of head, complaining of neck pain  EXAM: CT HEAD WITHOUT CONTRAST  CT CERVICAL SPINE WITHOUT CONTRAST  TECHNIQUE: Multidetector CT imaging of the head and cervical spine was performed following the standard protocol without intravenous contrast. Multiplanar CT image reconstructions of the cervical spine were also generated.  COMPARISON:  None.  FINDINGS: CT HEAD FINDINGS  Moderate age-related atrophy. No hemorrhage or extra-axial fluid. No infarct or mass. No hydrocephalus. Calvarium is intact. No evidence of skull fracture.  CT CERVICAL SPINE FINDINGS  No prevertebral soft tissue swelling. Normal alignment. There is C5-6 anterior compression plate with fusion. There is a mild to moderate disc osteophytic bulge at C4-5, and a large disc osteophytic bulge at C6-7.  There is a minimally displaced fracture involving the superior articular facets of C7 on the right. No other fractures identified.  IMPRESSION: No acute intracranial abnormality. Unilateral right C7 superior facet fracture. Critical Value/emergent results were called by telephone at the time of interpretation on 08/23/2014 at 8:00 pm to Dr. Shirlyn Goltz , who verbally acknowledged these results.   Electronically Signed   By: Skipper Cliche M.D.   On: 08/23/2014 20:01   Ct Abdomen Pelvis W Contrast  08/23/2014   CLINICAL DATA:  Patient status post fall from ladder. Positive loss of consciousness.  EXAM: CT CHEST, ABDOMEN, AND PELVIS WITH CONTRAST  TECHNIQUE: Multidetector CT imaging of the chest, abdomen and pelvis was performed following the standard protocol during bolus administration of intravenous contrast.  CONTRAST:  56mL OMNIPAQUE IOHEXOL 300 MG/ML  SOLN  COMPARISON:  Chest CT 01/29/2014  FINDINGS: CT CHEST FINDINGS  Visualized thyroid  is unremarkable. Normal heart size. No pericardial effusion. Aorta and  main pulmonary artery are normal in caliber. Central airways are patent. 4 mm left lower lobe nodule (image 46) is stable. 5 mm left lower lobe nodule (image 43) is stable. Minimal dependent ground-glass opacity within the left lower lobe. 3 mm right lower lobe nodule (image 35) is stable. Unchanged platelike atelectasis involving the right middle lobe. No definite pleural effusion or pneumothorax.  CT ABDOMEN AND PELVIS FINDINGS  Liver is normal in size and contour without focal hepatic lesion identified. Small calcification within the left hepatic lobe. Calcified granulomata within the spleen. Normal adrenal glands.  Multiple cysts within the right kidney with the largest measuring up to 3 cm, exophytic off the inferior portion. No hydronephrosis. The left kidney is not visualized. Normal caliber abdominal aorta. No retroperitoneal lymphadenopathy. Fat containing bilateral inguinal hernias. Prostate unremarkable. Urinary bladder is unremarkable.  Sigmoid colonic diverticulosis without CT evidence for acute diverticulitis. The appendix is normal. No abnormal bowel wall thickening or evidence for bowel obstruction. No free fluid or free intraperitoneal air.  Nondisplaced fracture through the lateral aspect of the right fifth rib. Postsurgical change proximal left femur.  IMPRESSION: Nondisplaced fracture of the lateral aspect of the right fifth rib.  Otherwise no acute traumatic injury within the chest, abdomen or pelvis.  Multiple bilateral pulmonary nodules, the largest of which measures 5 mm within the left lower lobe. Recommend additional follow-up chest CT in 12 months to ensure stability.   Electronically Signed   By: Lovey Newcomer M.D.   On: 08/23/2014 20:09   Dg Pelvis Portable  08/23/2014   CLINICAL DATA:  Fall  EXAM: PORTABLE PELVIS 1-2 VIEWS  COMPARISON:  None.  FINDINGS: There is no evidence of pelvic fracture or diastasis. No pelvic  bone lesions are seen. Clip projects over the left upper pelvis.  IMPRESSION: Negative.   Electronically Signed   By: Conchita Paris M.D.   On: 08/23/2014 18:04   Dg Chest Port 1 View  08/23/2014   CLINICAL DATA:  Fall, pain and laceration to the right knee and forearm  EXAM: PORTABLE CHEST - 1 VIEW  COMPARISON:  Chest CT 01/29/2014 and chest radiograph 10/23/2011  FINDINGS: Heart size at upper limits of normal. Cervical fusion hardware noted. Right basilar curvilinear scarring or atelectasis noted. Left basilar scarring is stable. No acute osseous abnormality.  IMPRESSION: Stable left basilar scarring. Right basilar scarring or atelectasis.   Electronically Signed   By: Conchita Paris M.D.   On: 08/23/2014 18:03   Dg Knee Complete 4 Views Right  08/23/2014   CLINICAL DATA:  Fall, left knee trauma  EXAM: RIGHT KNEE - COMPLETE 4+ VIEW  COMPARISON:  None.  FINDINGS: There is no evidence of fracture, dislocation, or joint effusion. There is no evidence of arthropathy or other focal bone abnormality. Soft tissues are unremarkable.  IMPRESSION: Negative.   Electronically Signed   By: Conchita Paris M.D.   On: 08/23/2014 18:08     EKG Interpretation None      MDM   Final diagnoses:  Trauma  Arm pain    Jared [name] is a 63 y.o. male here s/p fall. Had LOC so level 2 trauma called. GCS 15 now. Will get trauma scans and xrays.   8:29 PM CT showed R C7 superior facet fracture. Some weakness R arm. Also rib fracture. I dermabond the ear laceration. I consulted Dr. [name] from neurosurgery. Also has R 5th rib fracture. Also consulted trauma to admit. Patient remained in C collar.  Wandra Arthurs, MD 08/23/14 2105

## 2014-08-24 ENCOUNTER — Inpatient Hospital Stay (HOSPITAL_COMMUNITY): Payer: Medicare Other

## 2014-08-24 LAB — BASIC METABOLIC PANEL
Anion gap: 14 (ref 5–15)
BUN: 22 mg/dL (ref 6–23)
CO2: 25 mEq/L (ref 19–32)
Calcium: 10.2 mg/dL (ref 8.4–10.5)
Chloride: 104 mEq/L (ref 96–112)
Creatinine, Ser: 1.38 mg/dL — ABNORMAL HIGH (ref 0.50–1.35)
GFR calc Af Amer: 60 mL/min — ABNORMAL LOW (ref >90)
GFR calc non Af Amer: 52 mL/min — ABNORMAL LOW (ref >90)
Glucose, Bld: 122 mg/dL — ABNORMAL HIGH (ref 70–99)
Potassium: 5 mEq/L (ref 3.7–5.3)
Sodium: 143 mEq/L (ref 137–147)

## 2014-08-24 LAB — MRSA PCR SCREENING: MRSA by PCR: NEGATIVE

## 2014-08-24 LAB — CBC
HCT: 46.6 % (ref 39.0–52.0)
Hemoglobin: 15.5 g/dL (ref 13.0–17.0)
MCH: 29.5 pg (ref 26.0–34.0)
MCHC: 33.3 g/dL (ref 30.0–36.0)
MCV: 88.6 fL (ref 78.0–100.0)
Platelets: 268 10*3/uL (ref 150–400)
RBC: 5.26 MIL/uL (ref 4.22–5.81)
RDW: 14.6 % (ref 11.5–15.5)
WBC: 12.7 10*3/uL — ABNORMAL HIGH (ref 4.0–10.5)

## 2014-08-24 MED ORDER — CETYLPYRIDINIUM CHLORIDE 0.05 % MT LIQD
7.0000 mL | Freq: Two times a day (BID) | OROMUCOSAL | Status: DC
  Administered 2014-08-24 – 2014-08-29 (×6): 7 mL via OROMUCOSAL

## 2014-08-24 MED ORDER — CHLORHEXIDINE GLUCONATE 0.12 % MT SOLN
15.0000 mL | Freq: Two times a day (BID) | OROMUCOSAL | Status: DC
  Administered 2014-08-24 – 2014-08-29 (×11): 15 mL via OROMUCOSAL
  Filled 2014-08-24 (×8): qty 15

## 2014-08-24 NOTE — Progress Notes (Signed)
Subjective: Comfortable No sob  Objective: Vital signs in last 24 hours: Temp:  [97.5 F (36.4 C)-97.9 F (36.6 C)] 97.7 F (36.5 C) (12/06 0749) Pulse Rate:  [81-104] 88 (12/06 0800) Resp:  [7-22] 8 (12/06 0800) BP: (108-164)/(75-116) 130/99 mmHg (12/06 0800) SpO2:  [92 %-98 %] 97 % (12/06 0800) Weight:  [185 lb (83.915 kg)] 185 lb (83.915 kg) (12/05 1702)    Intake/Output from previous day: 12/05 0701 - 12/06 0700 In: 1100 [I.V.:1100] Out: 200 [Urine:200] Intake/Output this shift:    Lungs clear Abdomen soft, non tender  Lab Results:   Recent Labs  08/23/14 1711 08/23/14 1725 08/24/14 0244  WBC 8.5  --  12.7*  HGB 15.9 17.0 15.5  HCT 47.1 50.0 46.6  PLT 227  --  268   BMET  Recent Labs  08/23/14 1730 08/24/14 0244  NA 142 143  K 3.8 5.0  CL 103 104  CO2 24 25  GLUCOSE 107* 122*  BUN 24* 22  CREATININE 1.44* 1.38*  CALCIUM 10.9* 10.2   PT/INR  Recent Labs  08/23/14 1711  LABPROT 13.0  INR 0.97   ABG No results for input(s): PHART, HCO3 in the last 72 hours.  Invalid input(s): PCO2, PO2  Studies/Results: Dg Forearm Right  08/23/2014   CLINICAL DATA:  Fall, left forearm pain  EXAM: RIGHT FOREARM - 2 VIEW  COMPARISON:  None.  FINDINGS: There is no evidence of fracture or other focal bone lesions. Soft tissues are unremarkable.  IMPRESSION: Negative.   Electronically Signed   By: Conchita Paris M.D.   On: 08/23/2014 18:06   Ct Head Wo Contrast  08/23/2014   CLINICAL DATA:  Initial evaluation after falling off [name] ladder, loss of consciousness, abrasion to top of forehead and back of head, complaining of neck pain  EXAM: CT HEAD WITHOUT CONTRAST  CT CERVICAL SPINE WITHOUT CONTRAST  TECHNIQUE: Multidetector CT imaging of the head and cervical spine was performed following the standard protocol without intravenous contrast. Multiplanar CT image reconstructions of the cervical spine were also generated.  COMPARISON:  None.  FINDINGS: CT HEAD  FINDINGS  Moderate age-related atrophy. No hemorrhage or extra-axial fluid. No infarct or mass. No hydrocephalus. Calvarium is intact. No evidence of skull fracture.  CT CERVICAL SPINE FINDINGS  No prevertebral soft tissue swelling. Normal alignment. There is C5-6 anterior compression plate with fusion. There is [name] mild to moderate disc osteophytic bulge at C4-5, and [name] large disc osteophytic bulge at C6-7.  There is [name] minimally displaced fracture involving the superior articular facets of C7 on the right. No other fractures identified.  IMPRESSION: No acute intracranial abnormality. Unilateral right C7 superior facet fracture. Critical Value/emergent results were called by telephone at the time of interpretation on 08/23/2014 at 8:00 pm to Dr. Shirlyn Goltz , who verbally acknowledged these results.   Electronically Signed   By: Skipper Cliche M.D.   On: 08/23/2014 20:01   Ct Chest W Contrast  08/23/2014   CLINICAL DATA:  Patient status post fall from ladder. Positive loss of consciousness.  EXAM: CT CHEST, ABDOMEN, AND PELVIS WITH CONTRAST  TECHNIQUE: Multidetector CT imaging of the chest, abdomen and pelvis was performed following the standard protocol during bolus administration of intravenous contrast.  CONTRAST:  78mL OMNIPAQUE IOHEXOL 300 MG/ML  SOLN  COMPARISON:  Chest CT 01/29/2014  FINDINGS: CT CHEST FINDINGS  Visualized thyroid is unremarkable. Normal heart size. No pericardial effusion. Aorta and main pulmonary artery are normal in caliber. Central  airways are patent. 4 mm left lower lobe nodule (image 46) is stable. 5 mm left lower lobe nodule (image 43) is stable. Minimal dependent ground-glass opacity within the left lower lobe. 3 mm right lower lobe nodule (image 35) is stable. Unchanged platelike atelectasis involving the right middle lobe. No definite pleural effusion or pneumothorax.  CT ABDOMEN AND PELVIS FINDINGS  Liver is normal in size and contour without focal hepatic lesion identified. Small  calcification within the left hepatic lobe. Calcified granulomata within the spleen. Normal adrenal glands.  Multiple cysts within the right kidney with the largest measuring up to 3 cm, exophytic off the inferior portion. No hydronephrosis. The left kidney is not visualized. Normal caliber abdominal aorta. No retroperitoneal lymphadenopathy. Fat containing bilateral inguinal hernias. Prostate unremarkable. Urinary bladder is unremarkable.  Sigmoid colonic diverticulosis without CT evidence for acute diverticulitis. The appendix is normal. No abnormal bowel wall thickening or evidence for bowel obstruction. No free fluid or free intraperitoneal air.  Nondisplaced fracture through the lateral aspect of the right fifth rib. Postsurgical change proximal left femur.  IMPRESSION: Nondisplaced fracture of the lateral aspect of the right fifth rib.  Otherwise no acute traumatic injury within the chest, abdomen or pelvis.  Multiple bilateral pulmonary nodules, the largest of which measures 5 mm within the left lower lobe. Recommend additional follow-up chest CT in 12 months to ensure stability.   Electronically Signed   By: Lovey Newcomer M.D.   On: 08/23/2014 20:09   Ct Cervical Spine Wo Contrast  08/23/2014   CLINICAL DATA:  Initial evaluation after falling off [name] ladder, loss of consciousness, abrasion to top of forehead and back of head, complaining of neck pain  EXAM: CT HEAD WITHOUT CONTRAST  CT CERVICAL SPINE WITHOUT CONTRAST  TECHNIQUE: Multidetector CT imaging of the head and cervical spine was performed following the standard protocol without intravenous contrast. Multiplanar CT image reconstructions of the cervical spine were also generated.  COMPARISON:  None.  FINDINGS: CT HEAD FINDINGS  Moderate age-related atrophy. No hemorrhage or extra-axial fluid. No infarct or mass. No hydrocephalus. Calvarium is intact. No evidence of skull fracture.  CT CERVICAL SPINE FINDINGS  No prevertebral soft tissue swelling.  Normal alignment. There is C5-6 anterior compression plate with fusion. There is [name] mild to moderate disc osteophytic bulge at C4-5, and [name] large disc osteophytic bulge at C6-7.  There is [name] minimally displaced fracture involving the superior articular facets of C7 on the right. No other fractures identified.  IMPRESSION: No acute intracranial abnormality. Unilateral right C7 superior facet fracture. Critical Value/emergent results were called by telephone at the time of interpretation on 08/23/2014 at 8:00 pm to Dr. Shirlyn Goltz , who verbally acknowledged these results.   Electronically Signed   By: Skipper Cliche M.D.   On: 08/23/2014 20:01   Ct Abdomen Pelvis W Contrast  08/23/2014   CLINICAL DATA:  Patient status post fall from ladder. Positive loss of consciousness.  EXAM: CT CHEST, ABDOMEN, AND PELVIS WITH CONTRAST  TECHNIQUE: Multidetector CT imaging of the chest, abdomen and pelvis was performed following the standard protocol during bolus administration of intravenous contrast.  CONTRAST:  67mL OMNIPAQUE IOHEXOL 300 MG/ML  SOLN  COMPARISON:  Chest CT 01/29/2014  FINDINGS: CT CHEST FINDINGS  Visualized thyroid is unremarkable. Normal heart size. No pericardial effusion. Aorta and main pulmonary artery are normal in caliber. Central airways are patent. 4 mm left lower lobe nodule (image 46) is stable. 5 mm left lower lobe nodule (  image 43) is stable. Minimal dependent ground-glass opacity within the left lower lobe. 3 mm right lower lobe nodule (image 35) is stable. Unchanged platelike atelectasis involving the right middle lobe. No definite pleural effusion or pneumothorax.  CT ABDOMEN AND PELVIS FINDINGS  Liver is normal in size and contour without focal hepatic lesion identified. Small calcification within the left hepatic lobe. Calcified granulomata within the spleen. Normal adrenal glands.  Multiple cysts within the right kidney with the largest measuring up to 3 cm, exophytic off the inferior portion. No  hydronephrosis. The left kidney is not visualized. Normal caliber abdominal aorta. No retroperitoneal lymphadenopathy. Fat containing bilateral inguinal hernias. Prostate unremarkable. Urinary bladder is unremarkable.  Sigmoid colonic diverticulosis without CT evidence for acute diverticulitis. The appendix is normal. No abnormal bowel wall thickening or evidence for bowel obstruction. No free fluid or free intraperitoneal air.  Nondisplaced fracture through the lateral aspect of the right fifth rib. Postsurgical change proximal left femur.  IMPRESSION: Nondisplaced fracture of the lateral aspect of the right fifth rib.  Otherwise no acute traumatic injury within the chest, abdomen or pelvis.  Multiple bilateral pulmonary nodules, the largest of which measures 5 mm within the left lower lobe. Recommend additional follow-up chest CT in 12 months to ensure stability.   Electronically Signed   By: Lovey Newcomer M.D.   On: 08/23/2014 20:09   Dg Pelvis Portable  08/23/2014   CLINICAL DATA:  Fall  EXAM: PORTABLE PELVIS 1-2 VIEWS  COMPARISON:  None.  FINDINGS: There is no evidence of pelvic fracture or diastasis. No pelvic bone lesions are seen. Clip projects over the left upper pelvis.  IMPRESSION: Negative.   Electronically Signed   By: Conchita Paris M.D.   On: 08/23/2014 18:04   Dg Chest Port 1 View  08/23/2014   CLINICAL DATA:  Fall, pain and laceration to the right knee and forearm  EXAM: PORTABLE CHEST - 1 VIEW  COMPARISON:  Chest CT 01/29/2014 and chest radiograph 10/23/2011  FINDINGS: Heart size at upper limits of normal. Cervical fusion hardware noted. Right basilar curvilinear scarring or atelectasis noted. Left basilar scarring is stable. No acute osseous abnormality.  IMPRESSION: Stable left basilar scarring. Right basilar scarring or atelectasis.   Electronically Signed   By: Conchita Paris M.D.   On: 08/23/2014 18:03   Dg Knee Complete 4 Views Right  08/23/2014   CLINICAL DATA:  Fall, left knee  trauma  EXAM: RIGHT KNEE - COMPLETE 4+ VIEW  COMPARISON:  None.  FINDINGS: There is no evidence of fracture, dislocation, or joint effusion. There is no evidence of arthropathy or other focal bone abnormality. Soft tissues are unremarkable.  IMPRESSION: Negative.   Electronically Signed   By: Conchita Paris M.D.   On: 08/23/2014 18:08   Dg Humerus Right  08/23/2014   CLINICAL DATA:  Fall off ladder, upper forearm pain  EXAM: RIGHT HUMERUS - 2+ VIEW  COMPARISON:  None.  FINDINGS: No fracture or dislocation is seen.  Mild degenerative changes of the acromioclavicular joint.  The visualized soft tissues are unremarkable.  IMPRESSION: No fracture or dislocation is seen.   Electronically Signed   By: Julian Hy M.D.   On: 08/23/2014 20:44    Anti-infectives: Anti-infectives    Start     Dose/Rate Route Frequency Ordered Stop   08/23/14 1715  ceFAZolin (ANCEF) IVPB 1 g/50 mL premix     1 g100 mL/hr over 30 Minutes Intravenous  Once 08/23/14 1710 08/23/14 1827  Assessment/Plan: Multiple trauma s/p fall  MRI pending on c-spine Plans per neurosurg  LOS: 1 day    Jared [name] 08/24/2014

## 2014-08-24 NOTE — Progress Notes (Signed)
RT entered room to place patient on CPAP and patient is refusing to wear it tonight. Patient is currently on [facility] and sats are 97%. RT informed patient to let RT know if he changes his mind. Will continue to monitor.

## 2014-08-24 NOTE — Plan of Care (Signed)
Problem: Phase I Progression Outcomes Goal: Hemodynamically stable Outcome: Completed/Met Date Met:  08/24/14     

## 2014-08-24 NOTE — Progress Notes (Signed)
Subjective: Patient reports awake, alert, appropriate.  Still complaining of right arm pain.  Objective: Vital signs in last 24 hours: Temp:  [97.5 F (36.4 C)-97.9 F (36.6 C)] 97.7 F (36.5 C) (12/06 0749) Pulse Rate:  [81-104] 88 (12/06 0800) Resp:  [7-22] 8 (12/06 0800) BP: (108-164)/(75-116) 130/99 mmHg (12/06 0800) SpO2:  [92 %-98 %] 97 % (12/06 0800) Weight:  [83.915 kg (185 lb)] 83.915 kg (185 lb) (12/05 1702)  Intake/Output from previous day: 12/05 0701 - 12/06 0700 In: 1100 [I.V.:1100] Out: 200 [Urine:200] Intake/Output this shift:    Physical Exam: Awake, alert, conversant.  Right arm weakness and pain persists in C 7 distribution.  No other motor deficits or suggestion of cord injury.  Lab Results:  Recent Labs  08/23/14 1711 08/23/14 1725 08/24/14 0244  WBC 8.5  --  12.7*  HGB 15.9 17.0 15.5  HCT 47.1 50.0 46.6  PLT 227  --  268   BMET  Recent Labs  08/23/14 1730 08/24/14 0244  NA 142 143  K 3.8 5.0  CL 103 104  CO2 24 25  GLUCOSE 107* 122*  BUN 24* 22  CREATININE 1.44* 1.38*  CALCIUM 10.9* 10.2    Studies/Results: Dg Forearm Right  08/23/2014   CLINICAL DATA:  Fall, left forearm pain  EXAM: RIGHT FOREARM - 2 VIEW  COMPARISON:  None.  FINDINGS: There is no evidence of fracture or other focal bone lesions. Soft tissues are unremarkable.  IMPRESSION: Negative.   Electronically Signed   By: Conchita Paris M.D.   On: 08/23/2014 18:06   Ct Head Wo Contrast  08/23/2014   CLINICAL DATA:  Initial evaluation after falling off a ladder, loss of consciousness, abrasion to top of forehead and back of head, complaining of neck pain  EXAM: CT HEAD WITHOUT CONTRAST  CT CERVICAL SPINE WITHOUT CONTRAST  TECHNIQUE: Multidetector CT imaging of the head and cervical spine was performed following the standard protocol without intravenous contrast. Multiplanar CT image reconstructions of the cervical spine were also generated.  COMPARISON:  None.  FINDINGS: CT HEAD  FINDINGS  Moderate age-related atrophy. No hemorrhage or extra-axial fluid. No infarct or mass. No hydrocephalus. Calvarium is intact. No evidence of skull fracture.  CT CERVICAL SPINE FINDINGS  No prevertebral soft tissue swelling. Normal alignment. There is C5-6 anterior compression plate with fusion. There is a mild to moderate disc osteophytic bulge at C4-5, and a large disc osteophytic bulge at C6-7.  There is a minimally displaced fracture involving the superior articular facets of C7 on the right. No other fractures identified.  IMPRESSION: No acute intracranial abnormality. Unilateral right C7 superior facet fracture. Critical Value/emergent results were called by telephone at the time of interpretation on 08/23/2014 at 8:00 pm to Dr. Shirlyn Goltz , who verbally acknowledged these results.   Electronically Signed   By: Skipper Cliche M.D.   On: 08/23/2014 20:01   Ct Chest W Contrast  08/23/2014   CLINICAL DATA:  Patient status post fall from ladder. Positive loss of consciousness.  EXAM: CT CHEST, ABDOMEN, AND PELVIS WITH CONTRAST  TECHNIQUE: Multidetector CT imaging of the chest, abdomen and pelvis was performed following the standard protocol during bolus administration of intravenous contrast.  CONTRAST:  56mL OMNIPAQUE IOHEXOL 300 MG/ML  SOLN  COMPARISON:  Chest CT 01/29/2014  FINDINGS: CT CHEST FINDINGS  Visualized thyroid is unremarkable. Normal heart size. No pericardial effusion. Aorta and main pulmonary artery are normal in caliber. Central airways are patent. 4 mm left  lower lobe nodule (image 46) is stable. 5 mm left lower lobe nodule (image 43) is stable. Minimal dependent ground-glass opacity within the left lower lobe. 3 mm right lower lobe nodule (image 35) is stable. Unchanged platelike atelectasis involving the right middle lobe. No definite pleural effusion or pneumothorax.  CT ABDOMEN AND PELVIS FINDINGS  Liver is normal in size and contour without focal hepatic lesion identified. Small  calcification within the left hepatic lobe. Calcified granulomata within the spleen. Normal adrenal glands.  Multiple cysts within the right kidney with the largest measuring up to 3 cm, exophytic off the inferior portion. No hydronephrosis. The left kidney is not visualized. Normal caliber abdominal aorta. No retroperitoneal lymphadenopathy. Fat containing bilateral inguinal hernias. Prostate unremarkable. Urinary bladder is unremarkable.  Sigmoid colonic diverticulosis without CT evidence for acute diverticulitis. The appendix is normal. No abnormal bowel wall thickening or evidence for bowel obstruction. No free fluid or free intraperitoneal air.  Nondisplaced fracture through the lateral aspect of the right fifth rib. Postsurgical change proximal left femur.  IMPRESSION: Nondisplaced fracture of the lateral aspect of the right fifth rib.  Otherwise no acute traumatic injury within the chest, abdomen or pelvis.  Multiple bilateral pulmonary nodules, the largest of which measures 5 mm within the left lower lobe. Recommend additional follow-up chest CT in 12 months to ensure stability.   Electronically Signed   By: Lovey Newcomer M.D.   On: 08/23/2014 20:09   Ct Cervical Spine Wo Contrast  08/23/2014   CLINICAL DATA:  Initial evaluation after falling off a ladder, loss of consciousness, abrasion to top of forehead and back of head, complaining of neck pain  EXAM: CT HEAD WITHOUT CONTRAST  CT CERVICAL SPINE WITHOUT CONTRAST  TECHNIQUE: Multidetector CT imaging of the head and cervical spine was performed following the standard protocol without intravenous contrast. Multiplanar CT image reconstructions of the cervical spine were also generated.  COMPARISON:  None.  FINDINGS: CT HEAD FINDINGS  Moderate age-related atrophy. No hemorrhage or extra-axial fluid. No infarct or mass. No hydrocephalus. Calvarium is intact. No evidence of skull fracture.  CT CERVICAL SPINE FINDINGS  No prevertebral soft tissue swelling.  Normal alignment. There is C5-6 anterior compression plate with fusion. There is a mild to moderate disc osteophytic bulge at C4-5, and a large disc osteophytic bulge at C6-7.  There is a minimally displaced fracture involving the superior articular facets of C7 on the right. No other fractures identified.  IMPRESSION: No acute intracranial abnormality. Unilateral right C7 superior facet fracture. Critical Value/emergent results were called by telephone at the time of interpretation on 08/23/2014 at 8:00 pm to Dr. Shirlyn Goltz , who verbally acknowledged these results.   Electronically Signed   By: Skipper Cliche M.D.   On: 08/23/2014 20:01   Ct Abdomen Pelvis W Contrast  08/23/2014   CLINICAL DATA:  Patient status post fall from ladder. Positive loss of consciousness.  EXAM: CT CHEST, ABDOMEN, AND PELVIS WITH CONTRAST  TECHNIQUE: Multidetector CT imaging of the chest, abdomen and pelvis was performed following the standard protocol during bolus administration of intravenous contrast.  CONTRAST:  88mL OMNIPAQUE IOHEXOL 300 MG/ML  SOLN  COMPARISON:  Chest CT 01/29/2014  FINDINGS: CT CHEST FINDINGS  Visualized thyroid is unremarkable. Normal heart size. No pericardial effusion. Aorta and main pulmonary artery are normal in caliber. Central airways are patent. 4 mm left lower lobe nodule (image 46) is stable. 5 mm left lower lobe nodule (image 43) is stable. Minimal dependent  ground-glass opacity within the left lower lobe. 3 mm right lower lobe nodule (image 35) is stable. Unchanged platelike atelectasis involving the right middle lobe. No definite pleural effusion or pneumothorax.  CT ABDOMEN AND PELVIS FINDINGS  Liver is normal in size and contour without focal hepatic lesion identified. Small calcification within the left hepatic lobe. Calcified granulomata within the spleen. Normal adrenal glands.  Multiple cysts within the right kidney with the largest measuring up to 3 cm, exophytic off the inferior portion. No  hydronephrosis. The left kidney is not visualized. Normal caliber abdominal aorta. No retroperitoneal lymphadenopathy. Fat containing bilateral inguinal hernias. Prostate unremarkable. Urinary bladder is unremarkable.  Sigmoid colonic diverticulosis without CT evidence for acute diverticulitis. The appendix is normal. No abnormal bowel wall thickening or evidence for bowel obstruction. No free fluid or free intraperitoneal air.  Nondisplaced fracture through the lateral aspect of the right fifth rib. Postsurgical change proximal left femur.  IMPRESSION: Nondisplaced fracture of the lateral aspect of the right fifth rib.  Otherwise no acute traumatic injury within the chest, abdomen or pelvis.  Multiple bilateral pulmonary nodules, the largest of which measures 5 mm within the left lower lobe. Recommend additional follow-up chest CT in 12 months to ensure stability.   Electronically Signed   By: Lovey Newcomer M.D.   On: 08/23/2014 20:09   Dg Pelvis Portable  08/23/2014   CLINICAL DATA:  Fall  EXAM: PORTABLE PELVIS 1-2 VIEWS  COMPARISON:  None.  FINDINGS: There is no evidence of pelvic fracture or diastasis. No pelvic bone lesions are seen. Clip projects over the left upper pelvis.  IMPRESSION: Negative.   Electronically Signed   By: Conchita Paris M.D.   On: 08/23/2014 18:04   Dg Chest Port 1 View  08/23/2014   CLINICAL DATA:  Fall, pain and laceration to the right knee and forearm  EXAM: PORTABLE CHEST - 1 VIEW  COMPARISON:  Chest CT 01/29/2014 and chest radiograph 10/23/2011  FINDINGS: Heart size at upper limits of normal. Cervical fusion hardware noted. Right basilar curvilinear scarring or atelectasis noted. Left basilar scarring is stable. No acute osseous abnormality.  IMPRESSION: Stable left basilar scarring. Right basilar scarring or atelectasis.   Electronically Signed   By: Conchita Paris M.D.   On: 08/23/2014 18:03   Dg Knee Complete 4 Views Right  08/23/2014   CLINICAL DATA:  Fall, left knee  trauma  EXAM: RIGHT KNEE - COMPLETE 4+ VIEW  COMPARISON:  None.  FINDINGS: There is no evidence of fracture, dislocation, or joint effusion. There is no evidence of arthropathy or other focal bone abnormality. Soft tissues are unremarkable.  IMPRESSION: Negative.   Electronically Signed   By: Conchita Paris M.D.   On: 08/23/2014 18:08   Dg Humerus Right  08/23/2014   CLINICAL DATA:  Fall off ladder, upper forearm pain  EXAM: RIGHT HUMERUS - 2+ VIEW  COMPARISON:  None.  FINDINGS: No fracture or dislocation is seen.  Mild degenerative changes of the acromioclavicular joint.  The visualized soft tissues are unremarkable.  IMPRESSION: No fracture or dislocation is seen.   Electronically Signed   By: Julian Hy M.D.   On: 08/23/2014 20:44    Assessment/Plan: C7 facet fracture with right C 7 radiculopathy.  Patient awaits MRI c-spine.    LOS: 1 day    Peggyann Shoals, MD 08/24/2014, 9:47 AM

## 2014-08-25 ENCOUNTER — Inpatient Hospital Stay (HOSPITAL_COMMUNITY): Payer: Medicare Other

## 2014-08-25 MED ORDER — LEVOTHYROXINE SODIUM 25 MCG PO TABS
25.0000 ug | ORAL_TABLET | Freq: Every day | ORAL | Status: DC
  Administered 2014-08-26 – 2014-08-30 (×4): 25 ug via ORAL
  Filled 2014-08-25 (×5): qty 1

## 2014-08-25 NOTE — Progress Notes (Signed)
Contacted Dr. [name] in regards to his note indicating the cervical fractures are unstable and orders placed by trauma to ambulate pt up with assistance.  MD stated ok to work with physical therapy and pt can get up with assistance.  Nurse notified PT, Jeani Hawking. Will continue to monitor pt closely.   Jared [name] I 08/25/2014 3:52 PM

## 2014-08-25 NOTE — Progress Notes (Signed)
Pt refusing CPAP at this time. Instructed to call respiratory if he changes his mind. RT will continue to monitor.

## 2014-08-25 NOTE — Progress Notes (Signed)
Report recd from [place], [place] in [place]. Will monitor for pt's arrival to unit.    Angeline Slim I   08/25/2014 11:56 AM

## 2014-08-25 NOTE — Progress Notes (Signed)
Subjective: Patient reports "My neck and right arm hurt still. These three fingers are numb"  Objective: Vital signs in last 24 hours: Temp:  [97.5 F (36.4 C)-99.1 F (37.3 C)] 99.1 F (37.3 C) (12/07 0400) Pulse Rate:  [82-97] 84 (12/07 0700) Resp:  [7-21] 9 (12/07 0700) BP: (110-162)/(68-109) 134/81 mmHg (12/07 0700) SpO2:  [91 %-99 %] 95 % (12/07 0700)  Intake/Output from previous day: 12/06 0701 - 12/07 0700 In: 3 [I.V.:3] Out: 1250 [Urine:1250] Intake/Output this shift:    Alert, conversant. Reports neck & RUE pain with numbness rt thumb, index, & middle fingers. Good strength hand intrinsics bilaterally, bicep, triceps right weaker than left. Able to lift RUE to 45degrees Rt, 30degrees with left.  Pt reports known rt deltoid issue, (was awaiting MRI review with ortho), but left deltoid eakness only since fall. C6-7 subluxation with disc disruption per MRI.   Lab Results:  Recent Labs  08/23/14 1711 08/23/14 1725 08/24/14 0244  WBC 8.5  --  12.7*  HGB 15.9 17.0 15.5  HCT 47.1 50.0 46.6  PLT 227  --  268   BMET  Recent Labs  08/23/14 1730 08/24/14 0244  NA 142 143  K 3.8 5.0  CL 103 104  CO2 24 25  GLUCOSE 107* 122*  BUN 24* 22  CREATININE 1.44* 1.38*  CALCIUM 10.9* 10.2    Studies/Results: Dg Forearm Right  08/23/2014   CLINICAL DATA:  Fall, left forearm pain  EXAM: RIGHT FOREARM - 2 VIEW  COMPARISON:  None.  FINDINGS: There is no evidence of fracture or other focal bone lesions. Soft tissues are unremarkable.  IMPRESSION: Negative.   Electronically Signed   By: Conchita Paris M.D.   On: 08/23/2014 18:06   Ct Head Wo Contrast  08/23/2014   CLINICAL DATA:  Initial evaluation after falling off a ladder, loss of consciousness, abrasion to top of forehead and back of head, complaining of neck pain  EXAM: CT HEAD WITHOUT CONTRAST  CT CERVICAL SPINE WITHOUT CONTRAST  TECHNIQUE: Multidetector CT imaging of the head and cervical spine was performed following the  standard protocol without intravenous contrast. Multiplanar CT image reconstructions of the cervical spine were also generated.  COMPARISON:  None.  FINDINGS: CT HEAD FINDINGS  Moderate age-related atrophy. No hemorrhage or extra-axial fluid. No infarct or mass. No hydrocephalus. Calvarium is intact. No evidence of skull fracture.  CT CERVICAL SPINE FINDINGS  No prevertebral soft tissue swelling. Normal alignment. There is C5-6 anterior compression plate with fusion. There is a mild to moderate disc osteophytic bulge at C4-5, and a large disc osteophytic bulge at C6-7.  There is a minimally displaced fracture involving the superior articular facets of C7 on the right. No other fractures identified.  IMPRESSION: No acute intracranial abnormality. Unilateral right C7 superior facet fracture. Critical Value/emergent results were called by telephone at the time of interpretation on 08/23/2014 at 8:00 pm to Dr. Shirlyn Goltz , who verbally acknowledged these results.   Electronically Signed   By: Skipper Cliche M.D.   On: 08/23/2014 20:01   Ct Chest W Contrast  08/23/2014   CLINICAL DATA:  Patient status post fall from ladder. Positive loss of consciousness.  EXAM: CT CHEST, ABDOMEN, AND PELVIS WITH CONTRAST  TECHNIQUE: Multidetector CT imaging of the chest, abdomen and pelvis was performed following the standard protocol during bolus administration of intravenous contrast.  CONTRAST:  47mL OMNIPAQUE IOHEXOL 300 MG/ML  SOLN  COMPARISON:  Chest CT 01/29/2014  FINDINGS: CT CHEST  FINDINGS  Visualized thyroid is unremarkable. Normal heart size. No pericardial effusion. Aorta and main pulmonary artery are normal in caliber. Central airways are patent. 4 mm left lower lobe nodule (image 46) is stable. 5 mm left lower lobe nodule (image 43) is stable. Minimal dependent ground-glass opacity within the left lower lobe. 3 mm right lower lobe nodule (image 35) is stable. Unchanged platelike atelectasis involving the right middle  lobe. No definite pleural effusion or pneumothorax.  CT ABDOMEN AND PELVIS FINDINGS  Liver is normal in size and contour without focal hepatic lesion identified. Small calcification within the left hepatic lobe. Calcified granulomata within the spleen. Normal adrenal glands.  Multiple cysts within the right kidney with the largest measuring up to 3 cm, exophytic off the inferior portion. No hydronephrosis. The left kidney is not visualized. Normal caliber abdominal aorta. No retroperitoneal lymphadenopathy. Fat containing bilateral inguinal hernias. Prostate unremarkable. Urinary bladder is unremarkable.  Sigmoid colonic diverticulosis without CT evidence for acute diverticulitis. The appendix is normal. No abnormal bowel wall thickening or evidence for bowel obstruction. No free fluid or free intraperitoneal air.  Nondisplaced fracture through the lateral aspect of the right fifth rib. Postsurgical change proximal left femur.  IMPRESSION: Nondisplaced fracture of the lateral aspect of the right fifth rib.  Otherwise no acute traumatic injury within the chest, abdomen or pelvis.  Multiple bilateral pulmonary nodules, the largest of which measures 5 mm within the left lower lobe. Recommend additional follow-up chest CT in 12 months to ensure stability.   Electronically Signed   By: Lovey Newcomer M.D.   On: 08/23/2014 20:09   Ct Cervical Spine Wo Contrast  08/23/2014   CLINICAL DATA:  Initial evaluation after falling off a ladder, loss of consciousness, abrasion to top of forehead and back of head, complaining of neck pain  EXAM: CT HEAD WITHOUT CONTRAST  CT CERVICAL SPINE WITHOUT CONTRAST  TECHNIQUE: Multidetector CT imaging of the head and cervical spine was performed following the standard protocol without intravenous contrast. Multiplanar CT image reconstructions of the cervical spine were also generated.  COMPARISON:  None.  FINDINGS: CT HEAD FINDINGS  Moderate age-related atrophy. No hemorrhage or extra-axial  fluid. No infarct or mass. No hydrocephalus. Calvarium is intact. No evidence of skull fracture.  CT CERVICAL SPINE FINDINGS  No prevertebral soft tissue swelling. Normal alignment. There is C5-6 anterior compression plate with fusion. There is a mild to moderate disc osteophytic bulge at C4-5, and a large disc osteophytic bulge at C6-7.  There is a minimally displaced fracture involving the superior articular facets of C7 on the right. No other fractures identified.  IMPRESSION: No acute intracranial abnormality. Unilateral right C7 superior facet fracture. Critical Value/emergent results were called by telephone at the time of interpretation on 08/23/2014 at 8:00 pm to Dr. Shirlyn Goltz , who verbally acknowledged these results.   Electronically Signed   By: Skipper Cliche M.D.   On: 08/23/2014 20:01   Mr Cervical Spine Wo Contrast  08/24/2014   CLINICAL DATA:  Cervical spine fracture after falling off a ladder. Neck pain. RIGHT upper extremity numbness and weakness. Initial encounter.  EXAM: MRI CERVICAL SPINE WITHOUT CONTRAST  TECHNIQUE: Multiplanar, multisequence MR imaging of the cervical spine was performed. No intravenous contrast was administered.  COMPARISON:  CT cervical spine 08/23/2014. Plain radiographs of the cervical spine 04/12/2014. MRI cervical spine 05/17/2014.  FINDINGS: Alignment: There is 4 mm of traumatic subluxation C6 on C7 related to the posterior element injury at C6-7.  The anterior and posterior longitudinal ligaments are likely disrupted, and the LEFT C7 facet is widened consistent with a 3 column injury.  Vertebrae: There is a solid instrumented fusion at C5-6 anteriorly. There is no apparent C7 vertebral body injury. The RIGHT C7 superior articular process fracture is redemonstrated.  Cord: There is no abnormal cord signal. No cord contusion or evidence for blood products in the cord. No epidural hematoma is evident.  Posterior Fossa: No visible tonsillar herniation or cerebellar  infarct.  Vertebral Arteries: The LEFT vertebral is dominant. Flow voids are maintained bilaterally.  Paraspinal tissues: There is marked widening of the interspinous ligament distance between C6 and C7 compared to priors, and the posterior paravertebral soft tissues display marked increase STIR signal. There is slight prevertebral soft tissue swelling and edema at the C6-7 level.  Disc levels:  The individual disc spaces were examined as follows:  C2-3:  Normal.  C3-4: Mild bulge. RIGHT-sided uncinate spurring narrows the foramen. RIGHT C4 nerve root could be at risk.  C4-5: Shallow central protrusion. Slight effacement anterior subarachnoid space. BILATERAL foraminal narrowing could affect either C5 nerve root.  C5-6: Solid fusion. Residual RIGHT-sided uncinate spurring narrows the foramen potentially affecting the C6 nerve root.  C6-7: The disc is disrupted anteriorly and posteriorly and the disc space is significantly narrowed compared with the previous most recent MR. There is a traumatic disc protrusion/extrusion with partially calcified disc material, and/or posteriorly displaced superior endplate fragments from C7, narrowing both neural foramina, RIGHT greater than LEFT, compressing both C7 nerve roots. Disrupted RIGHT C6-7 facet complex due to fracture. Widened LEFT C6-C7 facet complex due to ligamentous injury. No cord compression.  C7-T1:  Mild facet arthropathy.  No impingement.  IMPRESSION: Traumatic 4 mm C6-7 subluxation related to posterior element injury, and C6-7 disc disruption. Fractured RIGHT C6-7 facet. Widened LEFT C6-7 facet. Interspinous widening. This is felt to represent an unstable three column injury.  Posterolateral disc protrusion/extrusion at C6-7, along with calcific and/or osseous fragments narrowing both neural foramina, RIGHT greater than LEFT compressing both C7 nerve roots.  No evidence for cord compression, cord contusion, or hematomyelia/epidural hematoma.  Solid fusion C5-C6.   Flow voids are maintained in both vertebral arteries, with LEFT vertebral dominant.   Electronically Signed   By: Rolla Flatten M.D.   On: 08/24/2014 15:18   Ct Abdomen Pelvis W Contrast  08/23/2014   CLINICAL DATA:  Patient status post fall from ladder. Positive loss of consciousness.  EXAM: CT CHEST, ABDOMEN, AND PELVIS WITH CONTRAST  TECHNIQUE: Multidetector CT imaging of the chest, abdomen and pelvis was performed following the standard protocol during bolus administration of intravenous contrast.  CONTRAST:  2mL OMNIPAQUE IOHEXOL 300 MG/ML  SOLN  COMPARISON:  Chest CT 01/29/2014  FINDINGS: CT CHEST FINDINGS  Visualized thyroid is unremarkable. Normal heart size. No pericardial effusion. Aorta and main pulmonary artery are normal in caliber. Central airways are patent. 4 mm left lower lobe nodule (image 46) is stable. 5 mm left lower lobe nodule (image 43) is stable. Minimal dependent ground-glass opacity within the left lower lobe. 3 mm right lower lobe nodule (image 35) is stable. Unchanged platelike atelectasis involving the right middle lobe. No definite pleural effusion or pneumothorax.  CT ABDOMEN AND PELVIS FINDINGS  Liver is normal in size and contour without focal hepatic lesion identified. Small calcification within the left hepatic lobe. Calcified granulomata within the spleen. Normal adrenal glands.  Multiple cysts within the right kidney with the largest measuring  up to 3 cm, exophytic off the inferior portion. No hydronephrosis. The left kidney is not visualized. Normal caliber abdominal aorta. No retroperitoneal lymphadenopathy. Fat containing bilateral inguinal hernias. Prostate unremarkable. Urinary bladder is unremarkable.  Sigmoid colonic diverticulosis without CT evidence for acute diverticulitis. The appendix is normal. No abnormal bowel wall thickening or evidence for bowel obstruction. No free fluid or free intraperitoneal air.  Nondisplaced fracture through the lateral aspect of the  right fifth rib. Postsurgical change proximal left femur.  IMPRESSION: Nondisplaced fracture of the lateral aspect of the right fifth rib.  Otherwise no acute traumatic injury within the chest, abdomen or pelvis.  Multiple bilateral pulmonary nodules, the largest of which measures 5 mm within the left lower lobe. Recommend additional follow-up chest CT in 12 months to ensure stability.   Electronically Signed   By: Lovey Newcomer M.D.   On: 08/23/2014 20:09   Dg Pelvis Portable  08/23/2014   CLINICAL DATA:  Fall  EXAM: PORTABLE PELVIS 1-2 VIEWS  COMPARISON:  None.  FINDINGS: There is no evidence of pelvic fracture or diastasis. No pelvic bone lesions are seen. Clip projects over the left upper pelvis.  IMPRESSION: Negative.   Electronically Signed   By: Conchita Paris M.D.   On: 08/23/2014 18:04   Dg Chest Port 1 View  08/23/2014   CLINICAL DATA:  Fall, pain and laceration to the right knee and forearm  EXAM: PORTABLE CHEST - 1 VIEW  COMPARISON:  Chest CT 01/29/2014 and chest radiograph 10/23/2011  FINDINGS: Heart size at upper limits of normal. Cervical fusion hardware noted. Right basilar curvilinear scarring or atelectasis noted. Left basilar scarring is stable. No acute osseous abnormality.  IMPRESSION: Stable left basilar scarring. Right basilar scarring or atelectasis.   Electronically Signed   By: Conchita Paris M.D.   On: 08/23/2014 18:03   Dg Knee Complete 4 Views Right  08/23/2014   CLINICAL DATA:  Fall, left knee trauma  EXAM: RIGHT KNEE - COMPLETE 4+ VIEW  COMPARISON:  None.  FINDINGS: There is no evidence of fracture, dislocation, or joint effusion. There is no evidence of arthropathy or other focal bone abnormality. Soft tissues are unremarkable.  IMPRESSION: Negative.   Electronically Signed   By: Conchita Paris M.D.   On: 08/23/2014 18:08   Dg Humerus Right  08/23/2014   CLINICAL DATA:  Fall off ladder, upper forearm pain  EXAM: RIGHT HUMERUS - 2+ VIEW  COMPARISON:  None.  FINDINGS: No  fracture or dislocation is seen.  Mild degenerative changes of the acromioclavicular joint.  The visualized soft tissues are unremarkable.  IMPRESSION: No fracture or dislocation is seen.   Electronically Signed   By: Julian Hy M.D.   On: 08/23/2014 20:44    Assessment/Plan:   LOS: 2 days  Pt will require surgical intervention. Will tentatively plan for Wednesday ACDF C6-7, possibly to include posterior stabilization, possibly decompression, based on findings during anterior surgery.   Jared [name] 08/25/2014, 8:17 AM    I have reviewed imaging studies.  Patient's cervical spinal fracture is unstable.  He will require anterior cervical decompression C 67 with possible posterior decompression and fusion as well.  This is planned for 08/27/14.

## 2014-08-25 NOTE — Plan of Care (Signed)
Problem: Consults Goal: Spinal Surgery Patient Education See Patient Education Module for education specifics.  Outcome: Progressing

## 2014-08-25 NOTE — Evaluation (Signed)
Physical Therapy Evaluation Patient Details Name: Jared [name] MRN: [id number] DOB: [birth year] Today's Date: 08/25/2014   History of Present Illness  Adm 08/23/14 s/p fall from ladder 15 ft; +LOC, +Rt 5th rib fx; + C6-7 subluxation related to posterior element injury, Fractured RIGHT C6-7 facet. Widened LEFT C6-7 facet. This is felt to represent an unstable three column injury. Posterolateral disc protrusion/extrusion at C6-7,  narrowing both neural foramina compressing both C7 nerve roots. No evidence for cord compression or cord contusion. Solid fusion C5-C6. Pt immobilized in hard collar with plan for ACDF Wed 12/09. 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 Pt currently with functional limitations due to the deficits listed below (see PT Problem List). Pt will require re-evaluation post-surgery on 12/09. Currently, pt will benefit from skilled PT to increase their independence and safety with mobility.     Follow Up Recommendations Other (comment) (TBA after surgery)    Equipment Recommendations  None recommended by PT    Recommendations for Other Services       Precautions / Restrictions Precautions Precautions: Cervical Precaution Comments: educated on precautions  Required Braces or Orthoses: Cervical Brace Cervical Brace: Hard collar;At all times      Mobility  Bed Mobility Overal bed mobility: Needs Assistance Bed Mobility: Rolling;Sidelying to Sit Rolling: Supervision Sidelying to sit: Min assist       General bed mobility comments: HOB 20; vc for technique; + rail; assist to raise torso due to UE weakness  Transfers Overall transfer level: Needs assistance Equipment used: Rolling walker (2 wheeled) Transfers: Sit to/from Stand Sit to Stand: Min assist         General transfer comment: vc for safe use of  RW; assist for come to stand  Ambulation/Gait Ambulation/Gait assistance: Min guard Ambulation Distance (Feet): 90 Feet Assistive device: Rolling walker (2 wheeled) Gait Pattern/deviations: Step-through pattern;Decreased stride length Gait velocity: decr   General Gait Details: guarded and slow;   Stairs            Wheelchair Mobility    Modified Rankin (Stroke Patients Only)       Balance                                             Pertinent Vitals/Pain Pain Assessment: 0-10 Pain Score: 7  Pain Location: neck and shoulder Pain Intervention(s): Limited activity within patient's tolerance;Monitored during session;Repositioned    Home Living Family/patient expects to be discharged to:: Private residence Living Arrangements: Spouse/significant other Available Help at Discharge: Family;Available 24 hours/day (wife self-employed) Type of Home: House Home Access: Stairs to enter Entrance Stairs-Rails: Left Entrance Stairs-Number of Steps: 2   Home Equipment: Environmental consultant - 2 wheels      Prior Function Level of Independence: Independent               Hand Dominance   Dominant Hand: Right    Extremity/Trunk Assessment   Upper Extremity Assessment: Defer to OT evaluation (Rt triceps 3+, Lt triceps 4/5)           Lower Extremity Assessment: Overall WFL for tasks assessed         Communication   Communication: No difficulties  Cognition Arousal/Alertness: Awake/alert Behavior During Therapy: WFL for tasks assessed/performed Overall  Cognitive Status: Within Functional Limits for tasks assessed (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)                      General Comments General comments (skin integrity, edema, etc.): c-collar adjusted to allow chin to sit on "shelf" of collar; educated on proper fit and need to keep chin seated    Exercises        Assessment/Plan    PT Assessment Patient needs continued PT  services  PT Diagnosis Difficulty walking;Acute pain   PT Problem List Decreased strength;Decreased activity tolerance;Decreased balance;Decreased mobility;Decreased knowledge of use of DME;Decreased knowledge of precautions;Impaired sensation;Pain  PT Treatment Interventions DME instruction;Gait training;Stair training;Functional mobility training;Therapeutic activities;Patient/family education   PT Goals (Current goals can be found in the Care Plan section) Acute Rehab PT Goals Patient Stated Goal: to regain strength in arms PT Goal Formulation: With patient Time For Goal Achievement: 08/26/14 Potential to Achieve Goals: Good    Frequency Min 5X/week   Barriers to discharge        Co-evaluation               End of Session Equipment Utilized During Treatment: Cervical collar;Gait belt Activity Tolerance: Patient tolerated treatment well Patient left: in chair;with call bell/phone within reach;with chair alarm set Nurse Communication: Mobility status         Time: 9432-7614 PT Time Calculation (min) (ACUTE ONLY): 23 min   Charges:   PT Evaluation $Initial PT Evaluation Tier I: 1 Procedure PT Treatments $Therapeutic Activity: 8-22 mins   PT G Codes:          Naliya Gish September 08, 2014, 4:42 PM Pager [phone]

## 2014-08-25 NOTE — Progress Notes (Signed)
Pt arrived per wheelchair by Danae Chen, 52M RN. No acute distress noted. Family members present at bedside.  Assessment performed as charted.   Angeline Slim I 08/25/2014 12:36 PM

## 2014-08-25 NOTE — Progress Notes (Signed)
[facility]  Progress Note   LOS: 2 days   Subjective: Pt alert today.  Family at bedside and paster.  Pain in neck, not much pain in right ribs.  No LE or back pain.  No abdominal pain or N/V/D.  Has been on bedrest - discontinued, therapies pending.  Pending OR Wednesday for neck.  C/o left arm weakness and right 1-3rd digit numbness/tingling.  H/o neck fusion at C5-6.   Objective: Vital signs in last 24 hours: Temp:  [97.5 F (36.4 C)-99.1 F (37.3 C)] 98.2 F (36.8 C) (12/07 0837) Pulse Rate:  [82-97] 85 (12/07 0837) Resp:  [7-21] 13 (12/07 0837) BP: (110-162)/(68-109) 146/85 mmHg (12/07 0837) SpO2:  [91 %-99 %] 93 % (12/07 0837)    Lab Results:  CBC  Recent Labs  08/23/14 1711 08/23/14 1725 08/24/14 0244  WBC 8.5  --  12.7*  HGB 15.9 17.0 15.5  HCT 47.1 50.0 46.6  PLT 227  --  268   BMET  Recent Labs  08/23/14 1730 08/24/14 0244  NA 142 143  K 3.8 5.0  CL 103 104  CO2 24 25  GLUCOSE 107* 122*  BUN 24* 22  CREATININE 1.44* 1.38*  CALCIUM 10.9* 10.2    Imaging: Dg Forearm Right  08/23/2014   CLINICAL DATA:  Fall, left forearm pain  EXAM: RIGHT FOREARM - 2 VIEW  COMPARISON:  None.  FINDINGS: There is no evidence of fracture or other focal bone lesions. Soft tissues are unremarkable.  IMPRESSION: Negative.   Electronically Signed   By: Conchita Paris M.D.   On: 08/23/2014 18:06   Ct Head Wo Contrast  08/23/2014   CLINICAL DATA:  Initial evaluation after falling off a ladder, loss of consciousness, abrasion to top of forehead and back of head, complaining of neck pain  EXAM: CT HEAD WITHOUT CONTRAST  CT CERVICAL SPINE WITHOUT CONTRAST  TECHNIQUE: Multidetector CT imaging of the head and cervical spine was performed following the standard protocol without intravenous contrast. Multiplanar CT image reconstructions of the cervical spine were also generated.  COMPARISON:  None.  FINDINGS: CT HEAD FINDINGS  Moderate age-related atrophy.  No hemorrhage or extra-axial fluid. No infarct or mass. No hydrocephalus. Calvarium is intact. No evidence of skull fracture.  CT CERVICAL SPINE FINDINGS  No prevertebral soft tissue swelling. Normal alignment. There is C5-6 anterior compression plate with fusion. There is a mild to moderate disc osteophytic bulge at C4-5, and a large disc osteophytic bulge at C6-7.  There is a minimally displaced fracture involving the superior articular facets of C7 on the right. No other fractures identified.  IMPRESSION: No acute intracranial abnormality. Unilateral right C7 superior facet fracture. Critical Value/emergent results were called by telephone at the time of interpretation on 08/23/2014 at 8:00 pm to Dr. Shirlyn Goltz , who verbally acknowledged these results.   Electronically Signed   By: Skipper Cliche M.D.   On: 08/23/2014 20:01   Ct Chest W Contrast  08/23/2014   CLINICAL DATA:  Patient status post fall from ladder. Positive loss of consciousness.  EXAM: CT CHEST, ABDOMEN, AND PELVIS WITH CONTRAST  TECHNIQUE: Multidetector CT imaging of the chest, abdomen and pelvis was performed following the standard protocol during bolus administration of intravenous contrast.  CONTRAST:  25mL OMNIPAQUE IOHEXOL 300 MG/ML  SOLN  COMPARISON:  Chest CT 01/29/2014  FINDINGS: CT CHEST FINDINGS  Visualized thyroid is unremarkable. Normal heart size. No pericardial effusion. Aorta and main pulmonary artery are  normal in caliber. Central airways are patent. 4 mm left lower lobe nodule (image 46) is stable. 5 mm left lower lobe nodule (image 43) is stable. Minimal dependent ground-glass opacity within the left lower lobe. 3 mm right lower lobe nodule (image 35) is stable. Unchanged platelike atelectasis involving the right middle lobe. No definite pleural effusion or pneumothorax.  CT ABDOMEN AND PELVIS FINDINGS  Liver is normal in size and contour without focal hepatic lesion identified. Small calcification within the left hepatic  lobe. Calcified granulomata within the spleen. Normal adrenal glands.  Multiple cysts within the right kidney with the largest measuring up to 3 cm, exophytic off the inferior portion. No hydronephrosis. The left kidney is not visualized. Normal caliber abdominal aorta. No retroperitoneal lymphadenopathy. Fat containing bilateral inguinal hernias. Prostate unremarkable. Urinary bladder is unremarkable.  Sigmoid colonic diverticulosis without CT evidence for acute diverticulitis. The appendix is normal. No abnormal bowel wall thickening or evidence for bowel obstruction. No free fluid or free intraperitoneal air.  Nondisplaced fracture through the lateral aspect of the right fifth rib. Postsurgical change proximal left femur.  IMPRESSION: Nondisplaced fracture of the lateral aspect of the right fifth rib.  Otherwise no acute traumatic injury within the chest, abdomen or pelvis.  Multiple bilateral pulmonary nodules, the largest of which measures 5 mm within the left lower lobe. Recommend additional follow-up chest CT in 12 months to ensure stability.   Electronically Signed   By: Lovey Newcomer M.D.   On: 08/23/2014 20:09   Ct Cervical Spine Wo Contrast  08/23/2014   CLINICAL DATA:  Initial evaluation after falling off a ladder, loss of consciousness, abrasion to top of forehead and back of head, complaining of neck pain  EXAM: CT HEAD WITHOUT CONTRAST  CT CERVICAL SPINE WITHOUT CONTRAST  TECHNIQUE: Multidetector CT imaging of the head and cervical spine was performed following the standard protocol without intravenous contrast. Multiplanar CT image reconstructions of the cervical spine were also generated.  COMPARISON:  None.  FINDINGS: CT HEAD FINDINGS  Moderate age-related atrophy. No hemorrhage or extra-axial fluid. No infarct or mass. No hydrocephalus. Calvarium is intact. No evidence of skull fracture.  CT CERVICAL SPINE FINDINGS  No prevertebral soft tissue swelling. Normal alignment. There is C5-6 anterior  compression plate with fusion. There is a mild to moderate disc osteophytic bulge at C4-5, and a large disc osteophytic bulge at C6-7.  There is a minimally displaced fracture involving the superior articular facets of C7 on the right. No other fractures identified.  IMPRESSION: No acute intracranial abnormality. Unilateral right C7 superior facet fracture. Critical Value/emergent results were called by telephone at the time of interpretation on 08/23/2014 at 8:00 pm to Dr. Shirlyn Goltz , who verbally acknowledged these results.   Electronically Signed   By: Skipper Cliche M.D.   On: 08/23/2014 20:01   Mr Cervical Spine Wo Contrast  08/24/2014   CLINICAL DATA:  Cervical spine fracture after falling off a ladder. Neck pain. RIGHT upper extremity numbness and weakness. Initial encounter.  EXAM: MRI CERVICAL SPINE WITHOUT CONTRAST  TECHNIQUE: Multiplanar, multisequence MR imaging of the cervical spine was performed. No intravenous contrast was administered.  COMPARISON:  CT cervical spine 08/23/2014. Plain radiographs of the cervical spine 04/12/2014. MRI cervical spine 05/17/2014.  FINDINGS: Alignment: There is 4 mm of traumatic subluxation C6 on C7 related to the posterior element injury at C6-7. The anterior and posterior longitudinal ligaments are likely disrupted, and the LEFT C7 facet is widened consistent with  a 3 column injury.  Vertebrae: There is a solid instrumented fusion at C5-6 anteriorly. There is no apparent C7 vertebral body injury. The RIGHT C7 superior articular process fracture is redemonstrated.  Cord: There is no abnormal cord signal. No cord contusion or evidence for blood products in the cord. No epidural hematoma is evident.  Posterior Fossa: No visible tonsillar herniation or cerebellar infarct.  Vertebral Arteries: The LEFT vertebral is dominant. Flow voids are maintained bilaterally.  Paraspinal tissues: There is marked widening of the interspinous ligament distance between C6 and C7  compared to priors, and the posterior paravertebral soft tissues display marked increase STIR signal. There is slight prevertebral soft tissue swelling and edema at the C6-7 level.  Disc levels:  The individual disc spaces were examined as follows:  C2-3:  Normal.  C3-4: Mild bulge. RIGHT-sided uncinate spurring narrows the foramen. RIGHT C4 nerve root could be at risk.  C4-5: Shallow central protrusion. Slight effacement anterior subarachnoid space. BILATERAL foraminal narrowing could affect either C5 nerve root.  C5-6: Solid fusion. Residual RIGHT-sided uncinate spurring narrows the foramen potentially affecting the C6 nerve root.  C6-7: The disc is disrupted anteriorly and posteriorly and the disc space is significantly narrowed compared with the previous most recent MR. There is a traumatic disc protrusion/extrusion with partially calcified disc material, and/or posteriorly displaced superior endplate fragments from C7, narrowing both neural foramina, RIGHT greater than LEFT, compressing both C7 nerve roots. Disrupted RIGHT C6-7 facet complex due to fracture. Widened LEFT C6-C7 facet complex due to ligamentous injury. No cord compression.  C7-T1:  Mild facet arthropathy.  No impingement.  IMPRESSION: Traumatic 4 mm C6-7 subluxation related to posterior element injury, and C6-7 disc disruption. Fractured RIGHT C6-7 facet. Widened LEFT C6-7 facet. Interspinous widening. This is felt to represent an unstable three column injury.  Posterolateral disc protrusion/extrusion at C6-7, along with calcific and/or osseous fragments narrowing both neural foramina, RIGHT greater than LEFT compressing both C7 nerve roots.  No evidence for cord compression, cord contusion, or hematomyelia/epidural hematoma.  Solid fusion C5-C6.  Flow voids are maintained in both vertebral arteries, with LEFT vertebral dominant.   Electronically Signed   By: Rolla Flatten M.D.   On: 08/24/2014 15:18   Ct Abdomen Pelvis W Contrast  08/23/2014    CLINICAL DATA:  Patient status post fall from ladder. Positive loss of consciousness.  EXAM: CT CHEST, ABDOMEN, AND PELVIS WITH CONTRAST  TECHNIQUE: Multidetector CT imaging of the chest, abdomen and pelvis was performed following the standard protocol during bolus administration of intravenous contrast.  CONTRAST:  40mL OMNIPAQUE IOHEXOL 300 MG/ML  SOLN  COMPARISON:  Chest CT 01/29/2014  FINDINGS: CT CHEST FINDINGS  Visualized thyroid is unremarkable. Normal heart size. No pericardial effusion. Aorta and main pulmonary artery are normal in caliber. Central airways are patent. 4 mm left lower lobe nodule (image 46) is stable. 5 mm left lower lobe nodule (image 43) is stable. Minimal dependent ground-glass opacity within the left lower lobe. 3 mm right lower lobe nodule (image 35) is stable. Unchanged platelike atelectasis involving the right middle lobe. No definite pleural effusion or pneumothorax.  CT ABDOMEN AND PELVIS FINDINGS  Liver is normal in size and contour without focal hepatic lesion identified. Small calcification within the left hepatic lobe. Calcified granulomata within the spleen. Normal adrenal glands.  Multiple cysts within the right kidney with the largest measuring up to 3 cm, exophytic off the inferior portion. No hydronephrosis. The left kidney is not visualized. Normal  caliber abdominal aorta. No retroperitoneal lymphadenopathy. Fat containing bilateral inguinal hernias. Prostate unremarkable. Urinary bladder is unremarkable.  Sigmoid colonic diverticulosis without CT evidence for acute diverticulitis. The appendix is normal. No abnormal bowel wall thickening or evidence for bowel obstruction. No free fluid or free intraperitoneal air.  Nondisplaced fracture through the lateral aspect of the right fifth rib. Postsurgical change proximal left femur.  IMPRESSION: Nondisplaced fracture of the lateral aspect of the right fifth rib.  Otherwise no acute traumatic injury within the chest, abdomen or  pelvis.  Multiple bilateral pulmonary nodules, the largest of which measures 5 mm within the left lower lobe. Recommend additional follow-up chest CT in 12 months to ensure stability.   Electronically Signed   By: Lovey Newcomer M.D.   On: 08/23/2014 20:09   Dg Pelvis Portable  08/23/2014   CLINICAL DATA:  Fall  EXAM: PORTABLE PELVIS 1-2 VIEWS  COMPARISON:  None.  FINDINGS: There is no evidence of pelvic fracture or diastasis. No pelvic bone lesions are seen. Clip projects over the left upper pelvis.  IMPRESSION: Negative.   Electronically Signed   By: Conchita Paris M.D.   On: 08/23/2014 18:04   Dg Chest Port 1 View  08/23/2014   CLINICAL DATA:  Fall, pain and laceration to the right knee and forearm  EXAM: PORTABLE CHEST - 1 VIEW  COMPARISON:  Chest CT 01/29/2014 and chest radiograph 10/23/2011  FINDINGS: Heart size at upper limits of normal. Cervical fusion hardware noted. Right basilar curvilinear scarring or atelectasis noted. Left basilar scarring is stable. No acute osseous abnormality.  IMPRESSION: Stable left basilar scarring. Right basilar scarring or atelectasis.   Electronically Signed   By: Conchita Paris M.D.   On: 08/23/2014 18:03   Dg Knee Complete 4 Views Right  08/23/2014   CLINICAL DATA:  Fall, left knee trauma  EXAM: RIGHT KNEE - COMPLETE 4+ VIEW  COMPARISON:  None.  FINDINGS: There is no evidence of fracture, dislocation, or joint effusion. There is no evidence of arthropathy or other focal bone abnormality. Soft tissues are unremarkable.  IMPRESSION: Negative.   Electronically Signed   By: Conchita Paris M.D.   On: 08/23/2014 18:08   Dg Humerus Right  08/23/2014   CLINICAL DATA:  Fall off ladder, upper forearm pain  EXAM: RIGHT HUMERUS - 2+ VIEW  COMPARISON:  None.  FINDINGS: No fracture or dislocation is seen.  Mild degenerative changes of the acromioclavicular joint.  The visualized soft tissues are unremarkable.  IMPRESSION: No fracture or dislocation is seen.   Electronically  Signed   By: Julian Hy M.D.   On: 08/23/2014 20:44     PE: General: pleasant, WD/WN white male who is laying in bed in NAD HEENT: head is normocephalic, c-spine collar in place.  Sclera are noninjected.  PERRL.  Ears and nose without any masses or lesions.  Mouth is pink and moist Heart: regular, rate, and rhythm.  Normal s1,s2. No obvious murmurs, gallops, or rubs noted.  Palpable radial and pedal pulses bilaterally Lungs: CTAB, no wheezes, rhonchi, or rales noted.  Respiratory effort nonlabored Abd: soft, NT/ND, +BS, no masses, hernias, or organomegaly MS: B/L shoulder pain, left arm weakness, right 1-3rd digit numbness, wrist/finger/elbow strength intact, LE atraumatic Skin: warm and dry with no masses, lesions, or rashes Psych: A&Ox3 with an appropriate affect.   Assessment/Plan: Fall Concussion - TBI teams Right C7 superior facet fracture with arm weakness/radiculopathy - MR cspine shows 3 column unstable fractures - OR for  ant cervical decompression C6-7 with possible posterior decompression and fusion on 08/27/14 Previous C5-6 fusion Right 5th rib fracture - pulm toilet New left shoulder pain - pending xray, consider Ortho consult Chronic right shoulder pain Abrasions to scalp and left ear - bacitracin CAD Hypothyroidism - synthroid Depression - abilify, cymbalta VTE - SCD's FEN - HH diet Dispo -- OR Wednesday 12/9, transfer to 4N - neuro floor, mobilize with PT/OT, SLP  Discussed plan with patient, wife, friend and paster at bedside   Excell Seltzer Pager: [place] PA Pager: [phone]   08/25/2014

## 2014-08-25 NOTE — Progress Notes (Signed)
PT Cancellation Note  Patient Details Name: Jared [name] MRN: [id number] DOB: [birth year]   Cancelled Treatment:    Reason Eval/Treat Not Completed: Medical issues which prohibited therapy   Noted orders for PT and ambulate with assistance placed by Trauma PA. Called to discuss Dr. Melven Sartorius note indicating the fractures are unstable. Dr. Grandville Silos returned the page and agreed Dr. [name] needs to be consulted re: activity orders. Pt's RN Anell Barr) and charge nurse Levada Dy) made aware and are placing a call to Dr. [name].  Will await clarification from neurosurgery if OK to proceed with activity prior to surgery.   Jared [name] 08/25/2014, 3:42 PM Pager [phone]

## 2014-08-26 LAB — COMPREHENSIVE METABOLIC PANEL
ALT: 26 U/L (ref 0–53)
AST: 29 U/L (ref 0–37)
Albumin: 3.2 g/dL — ABNORMAL LOW (ref 3.5–5.2)
Alkaline Phosphatase: 65 U/L (ref 39–117)
Anion gap: 12 (ref 5–15)
BUN: 22 mg/dL (ref 6–23)
CO2: 25 mEq/L (ref 19–32)
Calcium: 9.2 mg/dL (ref 8.4–10.5)
Chloride: 100 mEq/L (ref 96–112)
Creatinine, Ser: 1.32 mg/dL (ref 0.50–1.35)
GFR calc Af Amer: 64 mL/min — ABNORMAL LOW (ref >90)
GFR calc non Af Amer: 55 mL/min — ABNORMAL LOW (ref >90)
Glucose, Bld: 105 mg/dL — ABNORMAL HIGH (ref 70–99)
Potassium: 4.9 mEq/L (ref 3.7–5.3)
Sodium: 137 mEq/L (ref 137–147)
Total Bilirubin: 0.7 mg/dL (ref 0.3–1.2)
Total Protein: 7 g/dL (ref 6.0–8.3)

## 2014-08-26 LAB — CBC
HCT: 39.6 % (ref 39.0–52.0)
Hemoglobin: 13.1 g/dL (ref 13.0–17.0)
MCH: 28.6 pg (ref 26.0–34.0)
MCHC: 33.1 g/dL (ref 30.0–36.0)
MCV: 86.5 fL (ref 78.0–100.0)
Platelets: 208 10*3/uL (ref 150–400)
RBC: 4.58 MIL/uL (ref 4.22–5.81)
RDW: 14.1 % (ref 11.5–15.5)
WBC: 7.6 10*3/uL (ref 4.0–10.5)

## 2014-08-26 MED ORDER — POLYETHYLENE GLYCOL 3350 17 G PO PACK
17.0000 g | PACK | Freq: Every day | ORAL | Status: DC
  Administered 2014-08-26: 17 g via ORAL
  Filled 2014-08-26: qty 1

## 2014-08-26 MED ORDER — SENNA 8.6 MG PO TABS
1.0000 | ORAL_TABLET | Freq: Two times a day (BID) | ORAL | Status: DC
  Administered 2014-08-26 – 2014-08-30 (×7): 8.6 mg via ORAL
  Filled 2014-08-26 (×7): qty 1

## 2014-08-26 MED ORDER — BISACODYL 10 MG RE SUPP
10.0000 mg | Freq: Once | RECTAL | Status: AC
  Administered 2014-08-26: 10 mg via RECTAL
  Filled 2014-08-26: qty 1

## 2014-08-26 MED ORDER — POLYETHYLENE GLYCOL 3350 17 G PO PACK
17.0000 g | PACK | Freq: Every day | ORAL | Status: DC | PRN
  Administered 2014-08-26: 17 g via ORAL
  Filled 2014-08-26: qty 1

## 2014-08-26 NOTE — Progress Notes (Signed)
Subjective: Patient reports "I'm doing ok"  Objective: Vital signs in last 24 hours: Temp:  [97.8 F (36.6 C)-98.3 F (36.8 C)] 98.1 F (36.7 C) (12/08 0555) Pulse Rate:  [84-108] 91 (12/08 0555) Resp:  [12-20] 18 (12/08 0555) BP: (139-177)/(85-99) 148/88 mmHg (12/08 0555) SpO2:  [92 %-96 %] 93 % (12/08 0555)  Intake/Output from previous day: 12/07 0701 - 12/08 0700 In: 360 [P.O.:360] Out: 625 [Urine:625] Intake/Output this shift:    Alert, conversant. Doing well with SBA ambulation, voices understanding of need of assistance as extra measure to prevent falls. Left shoulder x-ray negative, Trauma monitoring function. Pt agreeable to proceed with c-spine surgery as planned tomorrow.  Lab Results:  Recent Labs  08/23/14 1711 08/23/14 1725 08/24/14 0244  WBC 8.5  --  12.7*  HGB 15.9 17.0 15.5  HCT 47.1 50.0 46.6  PLT 227  --  268   BMET  Recent Labs  08/23/14 1730 08/24/14 0244  NA 142 143  K 3.8 5.0  CL 103 104  CO2 24 25  GLUCOSE 107* 122*  BUN 24* 22  CREATININE 1.44* 1.38*  CALCIUM 10.9* 10.2    Studies/Results: Mr Cervical Spine Wo Contrast  08/24/2014   CLINICAL DATA:  Cervical spine fracture after falling off a ladder. Neck pain. RIGHT upper extremity numbness and weakness. Initial encounter.  EXAM: MRI CERVICAL SPINE WITHOUT CONTRAST  TECHNIQUE: Multiplanar, multisequence MR imaging of the cervical spine was performed. No intravenous contrast was administered.  COMPARISON:  CT cervical spine 08/23/2014. Plain radiographs of the cervical spine 04/12/2014. MRI cervical spine 05/17/2014.  FINDINGS: Alignment: There is 4 mm of traumatic subluxation C6 on C7 related to the posterior element injury at C6-7. The anterior and posterior longitudinal ligaments are likely disrupted, and the LEFT C7 facet is widened consistent with a 3 column injury.  Vertebrae: There is a solid instrumented fusion at C5-6 anteriorly. There is no apparent C7 vertebral body injury. The  RIGHT C7 superior articular process fracture is redemonstrated.  Cord: There is no abnormal cord signal. No cord contusion or evidence for blood products in the cord. No epidural hematoma is evident.  Posterior Fossa: No visible tonsillar herniation or cerebellar infarct.  Vertebral Arteries: The LEFT vertebral is dominant. Flow voids are maintained bilaterally.  Paraspinal tissues: There is marked widening of the interspinous ligament distance between C6 and C7 compared to priors, and the posterior paravertebral soft tissues display marked increase STIR signal. There is slight prevertebral soft tissue swelling and edema at the C6-7 level.  Disc levels:  The individual disc spaces were examined as follows:  C2-3:  Normal.  C3-4: Mild bulge. RIGHT-sided uncinate spurring narrows the foramen. RIGHT C4 nerve root could be at risk.  C4-5: Shallow central protrusion. Slight effacement anterior subarachnoid space. BILATERAL foraminal narrowing could affect either C5 nerve root.  C5-6: Solid fusion. Residual RIGHT-sided uncinate spurring narrows the foramen potentially affecting the C6 nerve root.  C6-7: The disc is disrupted anteriorly and posteriorly and the disc space is significantly narrowed compared with the previous most recent MR. There is a traumatic disc protrusion/extrusion with partially calcified disc material, and/or posteriorly displaced superior endplate fragments from C7, narrowing both neural foramina, RIGHT greater than LEFT, compressing both C7 nerve roots. Disrupted RIGHT C6-7 facet complex due to fracture. Widened LEFT C6-C7 facet complex due to ligamentous injury. No cord compression.  C7-T1:  Mild facet arthropathy.  No impingement.  IMPRESSION: Traumatic 4 mm C6-7 subluxation related to posterior element injury, and  C6-7 disc disruption. Fractured RIGHT C6-7 facet. Widened LEFT C6-7 facet. Interspinous widening. This is felt to represent an unstable three column injury.  Posterolateral disc  protrusion/extrusion at C6-7, along with calcific and/or osseous fragments narrowing both neural foramina, RIGHT greater than LEFT compressing both C7 nerve roots.  No evidence for cord compression, cord contusion, or hematomyelia/epidural hematoma.  Solid fusion C5-C6.  Flow voids are maintained in both vertebral arteries, with LEFT vertebral dominant.   Electronically Signed   By: Rolla Flatten M.D.   On: 08/24/2014 15:18   Dg Shoulder Left Port  08/25/2014   CLINICAL DATA:  Left shoulder pain.  EXAM: LEFT SHOULDER - 1 VIEW  COMPARISON:  April 12, 2014.  FINDINGS: There is no evidence of fracture or dislocation. There is no evidence of arthropathy or other focal bone abnormality. Soft tissues are unremarkable.  IMPRESSION: Normal left shoulder.   Electronically Signed   By: Sabino Dick M.D.   On: 08/25/2014 12:19    Assessment/Plan:   LOS: 3 days  ACDF planned for tomorrow, possible posterior decompression and fusion as well. NPO after midnight tonight & permit orders entered yesterday.   Verdis Prime 08/26/2014, 8:19 AM

## 2014-08-26 NOTE — Evaluation (Signed)
Speech Language Pathology Evaluation Patient Details Name: Jared [name] MRN: [id number] DOB: 11/23/1950 Today's Date: 08/26/2014 Time: 8768-1157 SLP Time Calculation (min) (ACUTE ONLY): 40 min  Problem List:  Patient Active Problem List   Diagnosis Date Noted  . Cervical spine fracture 08/23/2014  . Abnormal brain MRI 07/23/2014  . 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 Cervical vertebral fusion 05/02/2014  . Snoring 12/25/2013  . 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 Insomnia due to mental condition 12/25/2013  . Sleep talking   . Essential hypertension 07/08/2013  . Depression, prolonged 06/26/2013  . Night sweats 06/26/2013  . Chest pain at rest 06/25/2013  . Bronchiectasis without acute exacerbation 01/16/2008  . PULMONARY NODULE 12/20/2007  . HYPERNEPHROMA 11/27/2007  . DYSLIPIDEMIA 11/27/2007  . Coronary atherosclerosis 11/27/2007  . HEMORRHOIDS, INTERNAL 11/27/2007  . ASTHMA 11/27/2007  . ESOPHAGITIS 11/27/2007  . ESOPHAGEAL STRICTURE 11/27/2007  . GERD 11/27/2007  . GASTRITIS, ACUTE 11/27/2007  . DIVERTICULOSIS, COLON 11/27/2007  . ABDOMINAL PAIN, LEFT UPPER QUADRANT 11/27/2007  . HIP FRACTURE, LEFT 11/27/2007  . FRACTURE, TIBIA 11/27/2007  . BENIGN PROSTATIC HYPERTROPHY, HX OF 11/27/2007   Past Medical History:  Past Medical History  Diagnosis Date  . Asthma   . Allergic rhinitis   . Heart attack 2009  . Renal cell carcinoma 1997    Left kidney  . GERD (gastroesophageal reflux disease)   . IBS (irritable bowel syndrome)   . Diverticulosis   . History of esophageal stricture   . Internal hemorrhoids   . Depression   . Osteoarthritis   . Peripheral neuropathy   . Depression, prolonged 06/26/2013  . Hypertension   . Hyperlipidemia   . Coronary artery disease   . Sleep talking   . Snoring 12/25/2013  . 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 Cervical vertebral fusion 05/02/2014    Now presenting with C5-6 radiculopathy  . Shoulder  pain, right    Past Surgical History:  Past Surgical History  Procedure Laterality Date  . Back surgery  1992, 2013  . Neck surgery  2005  . Coronary stent placement    . Femur fracture surgery Left 1995  . Total knee arthroplasty Left 2005  . Carpal tunnel release Bilateral   . Nephrectomy Left     secondary to cancer  . Cardiac catheterization  05/10/2007    Minimal CAD, normal LV systolic function, medical management  . Cardiac catheterization  04/08/2008    RCA ulcerated 70-80% stenosis, stented with a 3x9mm Endeavor stent at 13atm for 50sec, reduced from 80% ulcerated stenosis to 0%.  . Cardiac catheterization  05/07/2009    50% distal left main disease-IVUS or flow wire too dangerous in particular setting to perform intervention.  . Cardiac catheterization  03/18/2010    Medical management  . Cardiovascular stress test  02/09/2012    Normal, no significant wall abnormalities noted   HPI:  63 yo male adm to [facility] after falling 15+ feet from roof of house with + LOC.  Pt had right 5th rib fracture, per note plans for cervical spine surgery 12/9.  Cognitive evaluation ordered.     Assessment / Plan / Recommendation Clinical Impression  Pt presents with mild cognitive deficits with scoring of 20/30 on MOCA test version 7.3.  Pt with fluent functional language and no dysarthria.  Difficulties in higher level cognitive areas include attention, abstract though and recall.  Pt named 4 words in 60 seconds which demonstrates decreased word finding/attention - he reports  some of this to be baseline "tip of tongue phenomenon".  SLP to follow up briefly to provide pt with compensation strategies to deal with possible ramifications of mild TBI.  Skilled intervention includes educating pt to results and to compensation strategies.      SLP Assessment  Patient needs continued Speech Lanaguage Pathology Services    Follow Up Recommendations  None    Frequency and Duration min 2x/week  1 week       SLP Goals  Progression toward goals: Progressing toward goals Potential to Achieve Goals (ACUTE ONLY): Good  SLP Evaluation Prior Functioning  Cognitive/Linguistic Baseline: Within functional limits (pt does state baseline minimal word finding difficulies "tip of tongue phenomenon") Type of Home: House  Lives With: Spouse Available Help at Discharge: Family;Available 24 hours/day (wife self-employed) Education: college degree in Therapist, sports Vocation: Retired   Associate Professor  Overall Cognitive Status: Impaired/Different from baseline Arousal/Alertness: Awake/alert Orientation Level: Oriented X4 (except date ) Attention: Sustained Sustained Attention: Impaired Sustained Attention Impairment: Verbal complex Selective Attention: Appears intact Memory: Impaired Memory Impairment: Storage deficit;Retrieval deficit;Decreased recall of new information Awareness: Appears intact Problem Solving: Appears intact Safety/Judgment: Appears intact (stated last evening he thought of getting up alone for restroom but knew he needed to call due to fall risk)    Comprehension  Auditory Comprehension Overall Auditory Comprehension: Appears within functional limits for tasks assessed Yes/No Questions: Not tested Commands: Within Functional Limits Conversation: Complex Interfering Components: Pain Visual Recognition/Discrimination Discrimination: Not tested Reading Comprehension Reading Status: Not tested    Expression Expression Primary Mode of Expression: Verbal Verbal Expression Overall Verbal Expression: Appears within functional limits for tasks assessed Initiation: No impairment Level of Generative/Spontaneous Verbalization: Conversation Repetition: Impaired Level of Impairment: Sentence level (only a few articles) Naming: No impairment Pragmatics: No impairment Written Expression Dominant Hand: Right Written Expression: Not tested   Oral / Motor Oral Motor/Sensory  Function Overall Oral Motor/Sensory Function: Appears within functional limits for tasks assessed Motor Speech Overall Motor Speech: Appears within functional limits for tasks assessed   [place], [place] [facility] SLP [phone]

## 2014-08-26 NOTE — Progress Notes (Signed)
[facility]  Progress Note   LOS: 3 days   Subjective: Pt doing well, neck feeling better.  Says his right 1-3rd digits are not as numb as yesterday.  Less weakness noted in his right/left arms.  Says left shoulder hurts, but xray neg.  Tolerating diet.  Pending surgery tomorrow.  Feels distended.  Objective: Vital signs in last 24 hours: Temp:  [97.8 F (36.6 C)-98.3 F (36.8 C)] 98.1 F (36.7 C) (12/08 0555) Pulse Rate:  [84-108] 91 (12/08 0555) Resp:  [12-20] 18 (12/08 0555) BP: (139-177)/(85-99) 148/88 mmHg (12/08 0555) SpO2:  [92 %-96 %] 93 % (12/08 0555) Last BM Date: 08/23/14  Lab Results:  CBC  Recent Labs  08/23/14 1711 08/23/14 1725 08/24/14 0244  WBC 8.5  --  12.7*  HGB 15.9 17.0 15.5  HCT 47.1 50.0 46.6  PLT 227  --  268   BMET  Recent Labs  08/23/14 1730 08/24/14 0244  NA 142 143  K 3.8 5.0  CL 103 104  CO2 24 25  GLUCOSE 107* 122*  BUN 24* 22  CREATININE 1.44* 1.38*  CALCIUM 10.9* 10.2    Imaging: Mr Cervical Spine Wo Contrast  08/24/2014   CLINICAL DATA:  Cervical spine fracture after falling off a ladder. Neck pain. RIGHT upper extremity numbness and weakness. Initial encounter.  EXAM: MRI CERVICAL SPINE WITHOUT CONTRAST  TECHNIQUE: Multiplanar, multisequence MR imaging of the cervical spine was performed. No intravenous contrast was administered.  COMPARISON:  CT cervical spine 08/23/2014. Plain radiographs of the cervical spine 04/12/2014. MRI cervical spine 05/17/2014.  FINDINGS: Alignment: There is 4 mm of traumatic subluxation C6 on C7 related to the posterior element injury at C6-7. The anterior and posterior longitudinal ligaments are likely disrupted, and the LEFT C7 facet is widened consistent with a 3 column injury.  Vertebrae: There is a solid instrumented fusion at C5-6 anteriorly. There is no apparent C7 vertebral body injury. The RIGHT C7 superior articular process fracture is redemonstrated.  Cord: There is  no abnormal cord signal. No cord contusion or evidence for blood products in the cord. No epidural hematoma is evident.  Posterior Fossa: No visible tonsillar herniation or cerebellar infarct.  Vertebral Arteries: The LEFT vertebral is dominant. Flow voids are maintained bilaterally.  Paraspinal tissues: There is marked widening of the interspinous ligament distance between C6 and C7 compared to priors, and the posterior paravertebral soft tissues display marked increase STIR signal. There is slight prevertebral soft tissue swelling and edema at the C6-7 level.  Disc levels:  The individual disc spaces were examined as follows:  C2-3:  Normal.  C3-4: Mild bulge. RIGHT-sided uncinate spurring narrows the foramen. RIGHT C4 nerve root could be at risk.  C4-5: Shallow central protrusion. Slight effacement anterior subarachnoid space. BILATERAL foraminal narrowing could affect either C5 nerve root.  C5-6: Solid fusion. Residual RIGHT-sided uncinate spurring narrows the foramen potentially affecting the C6 nerve root.  C6-7: The disc is disrupted anteriorly and posteriorly and the disc space is significantly narrowed compared with the previous most recent MR. There is a traumatic disc protrusion/extrusion with partially calcified disc material, and/or posteriorly displaced superior endplate fragments from C7, narrowing both neural foramina, RIGHT greater than LEFT, compressing both C7 nerve roots. Disrupted RIGHT C6-7 facet complex due to fracture. Widened LEFT C6-C7 facet complex due to ligamentous injury. No cord compression.  C7-T1:  Mild facet arthropathy.  No impingement.  IMPRESSION: Traumatic 4 mm C6-7 subluxation related to posterior element  injury, and C6-7 disc disruption. Fractured RIGHT C6-7 facet. Widened LEFT C6-7 facet. Interspinous widening. This is felt to represent an unstable three column injury.  Posterolateral disc protrusion/extrusion at C6-7, along with calcific and/or osseous fragments narrowing  both neural foramina, RIGHT greater than LEFT compressing both C7 nerve roots.  No evidence for cord compression, cord contusion, or hematomyelia/epidural hematoma.  Solid fusion C5-C6.  Flow voids are maintained in both vertebral arteries, with LEFT vertebral dominant.   Electronically Signed   By: Rolla Flatten M.D.   On: 08/24/2014 15:18   Dg Shoulder Left Port  08/25/2014   CLINICAL DATA:  Left shoulder pain.  EXAM: LEFT SHOULDER - 1 VIEW  COMPARISON:  April 12, 2014.  FINDINGS: There is no evidence of fracture or dislocation. There is no evidence of arthropathy or other focal bone abnormality. Soft tissues are unremarkable.  IMPRESSION: Normal left shoulder.   Electronically Signed   By: Sabino Dick M.D.   On: 08/25/2014 12:19     PE: General: pleasant, WD/WN white male who is laying in bed in NAD HEENT: head is normocephalic, c-spine collar in place. Sclera are noninjected. PERRL. Ears and nose without any masses or lesions. Mouth is pink and moist Heart: regular, rate, and rhythm. Normal s1,s2. No obvious murmurs, gallops, or rubs noted. Palpable radial and pedal pulses bilaterally Lungs: CTAB, no wheezes, rhonchi, or rales noted. Respiratory effort nonlabored Abd: soft, NT/ND, +BS, no masses, hernias, or organomegaly MS: B/L shoulder pain, right 1-3rd digit numbness improved, wrist/finger/elbow strength intact, No significant strength deficit noted to elbows/wrist/fingers/grip strength, sensation and circulation intact b/l to all 4 ext, LE atraumatic Skin: warm and dry with no masses, lesions, or rashes Psych: A&Ox3 with an appropriate affect.   Assessment/Plan: Fall off roof - HD #3 Concussion - TBI teams Right C7 superior facet fracture with arm weakness/radiculopathy - MR cspine shows 3 column unstable fractures - OR for ant cervical decompression C6-7 with possible posterior decompression and fusion on 08/27/14 Previous C5-6 fusion Right 5th rib fracture - pulm toilet New  left shoulder pain - xray negative, will hold off on Ortho consult right now unless Dr. [name] believes it is necessary Chronic right shoulder pain Abrasions to scalp and left ear - bacitracin CAD Hypothyroidism - synthroid Depression - abilify, cymbalta VTE - SCD's FEN - HH diet, bowel regimen started Dispo -- OR Wednesday 12/9, On 4N, mobilize with PT/OT, SLP   Coralie Keens, PA-C Pager: [phone] General Trauma PA Pager: [phone]   08/26/2014

## 2014-08-26 NOTE — Plan of Care (Signed)
Problem: Phase I Progression Outcomes Goal: Pain controlled with appropriate interventions Outcome: Progressing     

## 2014-08-26 NOTE — Progress Notes (Signed)
Occupational Therapy Evaluation Patient Details Name: Jared [name] MRN: [id number] DOB: 1951-04-19 Today's Date: 08/26/2014    History of Present Illness Adm 08/23/14 s/p fall from ladder 15 ft; +LOC, +Rt 5th rib fx; + C6-7 subluxation related to posterior element injury, Fractured RIGHT C6-7 facet. Widened LEFT C6-7 facet. This is felt to represent an unstable three column injury. Posterolateral disc protrusion/extrusion at C6-7,  narrowing both neural foramina compressing both C7 nerve roots. No evidence for cord compression or cord contusion. Solid fusion C5-C6. Pt immobilized in hard collar with plan for ACDF Wed 12/09. PMHx-Lt TKA; Lt femur fx; Lt nephrectomy 2/2 cancer; chronic Rt shoulder pain; c5-6 fusion; pt reports recent inner ear/vertigo problems   Clinical Impression   Pt admitted with the above injuries. Pt currently with functional limitiations due to the deficits listed below (see OT problem list). Pt willr equire re-evaluation post-surgery on 08/27/14. Pt will benefit from skilled OT to increase their independence and safety with adls and balance.     Follow Up Recommendations  Other (comment) (TBD after surgery )    Equipment Recommendations  Tub/shower seat (Determine if pt prefers tub bench or seat)    Recommendations for Other Services       Precautions / Restrictions Precautions Precautions: Cervical Precaution Comments: Reviewed precautions Required Braces or Orthoses: Cervical Brace Cervical Brace: Hard collar;At all times Restrictions Weight Bearing Restrictions: No      Mobility Bed Mobility Overal bed mobility: Needs Assistance Bed Mobility: Rolling;Sit to Sidelying         Sit to sidelying: Min guard General bed mobility comments: HOB flat; use of bedrails. Good demo of log roll technique  Transfers Overall transfer level: Needs assistance Equipment used: Rolling walker (2 wheeled) Transfers: Sit to/from Stand Sit to Stand: Min guard          General transfer comment: Min guard (A) for safety. Good demo of hand placement and RW use.    Balance Overall balance assessment: Needs assistance Sitting-balance support: No upper extremity supported;Feet supported Sitting balance-Leahy Scale: Fair     Standing balance support: Bilateral upper extremity supported;During functional activity Standing balance-Leahy Scale: Poor                              ADL Overall ADL's : Needs assistance/impaired     Grooming: Wash/dry hands;Wash/dry face;Oral care;Min guard;Cueing for compensatory techniques;Standing Grooming Details (indicate cue type and reason): Min guard (A) for safety. Educated pt on using 2 cups for oral care to avoid bending.             Lower Body Dressing: Moderate assistance;Sitting/lateral leans Lower Body Dressing Details (indicate cue type and reason): Able to don sock on R foot. Mod (A) to don sock on L foot. Pt reports LLE is more difficult due to TKA and past hip fx Toilet Transfer: Min guard;Cueing for safety;Ambulation;BSC;RW   Toileting- Water quality scientist and Hygiene: Min guard;Sit to/from stand       Functional mobility during ADLs: Min guard;Rolling walker General ADL Comments: Pt with good demo of cervical precautions. Educated pt on compensatory techniques for oral care and to keep items on R side to avoid turning head. Pt ambulated in hallway 200' with no overt LOB and maintained conversation with therapist. Pt will benefit from AE education/practice for LB especially LLE.      Vision  Additional Comments: Pt able to read sign on parking garage approximately ~300 ft away.   Perception     Praxis      Pertinent Vitals/Pain Pain Assessment: 0-10 Pain Score: 6  Pain Location: neck and shoulder Pain Descriptors / Indicators: Sore Pain Intervention(s): Limited activity within patient's tolerance;Monitored during session;Repositioned     Hand  Dominance Right   Extremity/Trunk Assessment Upper Extremity Assessment Upper Extremity Assessment: RUE deficits/detail RUE Deficits / Details: Numbness in thumb, 1st and 2nd digits RUE Sensation: decreased light touch RUE Coordination: decreased fine motor   Lower Extremity Assessment Lower Extremity Assessment: Defer to PT evaluation   Cervical / Trunk Assessment Cervical / Trunk Assessment: Normal   Communication Communication Communication: No difficulties   Cognition Arousal/Alertness: Awake/alert Behavior During Therapy: WFL for tasks assessed/performed Overall Cognitive Status: Within Functional Limits for tasks assessed                     General Comments       Exercises       Shoulder Instructions      Home Living Family/patient expects to be discharged to:: Private residence                   Bathroom Shower/Tub: Tub/shower unit;Curtain Shower/tub characteristics: Architectural technologist: Standard     Home Equipment: Grab bars - tub/shower;Walker - 2 wheels;Hand held Veterinary surgeon - single point   Additional Comments: Has DME from previous surgeries; unsure if he still has all of it. Pt says wife can work from home, but may be gone during the day. Will need to confirm these things with wife.      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     OT Diagnosis: Generalized weakness;Acute pain   OT Problem List: Decreased strength;Decreased range of motion;Decreased activity tolerance;Impaired balance (sitting and/or standing);Decreased coordination;Decreased safety awareness;Decreased knowledge of use of DME or AE;Decreased knowledge of precautions;Impaired sensation;Pain   OT Treatment/Interventions: Self-care/ADL training;Therapeutic exercise;DME and/or AE instruction;Therapeutic activities;Patient/family education;Balance  training    OT Goals(Current goals can be found in the care plan section) Acute Rehab OT Goals Patient Stated Goal: to go home OT Goal Formulation: With patient Time For Goal Achievement: 09/09/14 Potential to Achieve Goals: Good ADL Goals Pt Will Perform Upper Body Bathing: with modified independence;sitting Pt Will Perform Lower Body Dressing: with supervision;with adaptive equipment;sit to/from stand Pt Will Perform Tub/Shower Transfer: Tub transfer;with supervision;ambulating;rolling walker Additional ADL Goal #1: Pt will verbalize and adhere to cervical precautions with 100% accuracy prior to discharge.  OT Frequency: Min 3X/week   Barriers to D/C:            Co-evaluation              End of Session Equipment Utilized During Treatment: Gait belt;Rolling walker;Cervical collar Nurse Communication: Mobility status;Precautions  Activity Tolerance: Patient tolerated treatment well Patient left: in bed;with call bell/phone within reach;with bed alarm set   Time: 5643-3295 OT Time Calculation (min): 27 min Charges:    G-Codes:    Redmond Baseman 09/16/14, 2:42 PM

## 2014-08-26 NOTE — Progress Notes (Signed)
Pt refusing CPAP at this time. Instructed to call respiratory if he changes his mind. RT will continue to monitor.

## 2014-08-26 NOTE — Plan of Care (Signed)
Problem: Phase I Progression Outcomes Goal: OOB as tolerated unless otherwise ordered Outcome: Progressing     

## 2014-08-26 NOTE — Progress Notes (Signed)
Physical Therapy Treatment and Discharge Patient Details Name: Jared [name] MRN: [id number] DOB: 1951-02-27 Today's Date: 08/26/2014    History of Present Illness Adm 08/23/14 s/p fall from ladder 15 ft; +LOC, +Rt 5th rib fx; + C6-7 subluxation related to posterior element injury, Fractured RIGHT C6-7 facet. Widened LEFT C6-7 facet. This is felt to represent an unstable three column injury. Posterolateral disc protrusion/extrusion at C6-7,  narrowing both neural foramina compressing both C7 nerve roots. No evidence for cord compression or cord contusion. Solid fusion C5-C6. Pt immobilized in hard collar with plan for ACDF Wed 12/09. PMHx-Lt TKA; Lt femur fx; Lt nephrectomy 2/2 cancer; chronic Rt shoulder pain; c5-6 fusion; pt reports recent inner ear/vertigo problems    PT Comments    Pt mobilizing well and able to recall education related to cervical precautions. Able to verbalize importance of having assistance with him when up moving around to reduce fall risk. Will d/c PT due to pending surgery 08/27/14 and request MD re-order PT when appropriate post-op.    Follow Up Recommendations  Other (comment) (TBA after surgery)     Equipment Recommendations  Rolling walker with 5" wheels    Recommendations for Other Services       Precautions / Restrictions Precautions Precautions: Cervical Precaution Comments: able to state precautions Required Braces or Orthoses: Cervical Brace Cervical Brace: Hard collar;At all times Restrictions Weight Bearing Restrictions: No    Mobility  Bed Mobility Overal bed mobility: Needs Assistance Bed Mobility: Rolling;Sidelying to Sit Rolling: Supervision Sidelying to sit: Min guard     Sit to sidelying: Min guard General bed mobility comments: HOB 20; + rail; near need for assist to raise torso  Transfers Overall transfer level: Needs assistance Equipment used: Rolling walker (2 wheeled) Transfers: Sit to/from Stand Sit to Stand: Min  guard         General transfer comment: vc for safe use of RW; minguard for safety due to unstable neck fx's  Ambulation/Gait Ambulation/Gait assistance: Min guard Ambulation Distance (Feet): 150 Feet Assistive device: Rolling walker (2 wheeled) Gait Pattern/deviations: Step-through pattern;Decreased stride length Gait velocity: decr   General Gait Details: guarded and slow, but steady   Financial trader Rankin (Stroke Patients Only)       Balance Overall balance assessment: Needs assistance Sitting-balance support: No upper extremity supported;Feet supported Sitting balance-Leahy Scale: Fair     Standing balance support: Bilateral upper extremity supported;During functional activity Standing balance-Leahy Scale: Poor                      Cognition Arousal/Alertness: Awake/alert Behavior During Therapy: WFL for tasks assessed/performed Overall Cognitive Status: Within Functional Limits for tasks assessed (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)                      Exercises Other Exercises Other Exercises: minisquats with RW for LE strengthening and minimal balance challenge    General Comments General comments (skin integrity, edema, etc.): Wife present      Pertinent Vitals/Pain Pain Assessment: 0-10 Pain Score: 8  Pain Location: neck and shoulder Pain Descriptors / Indicators: Sore Pain Intervention(s): Limited activity within patient's tolerance;Monitored during session;Repositioned;Patient requesting pain meds-RN notified    Home Living Family/patient expects to be discharged to:: Private residence             Home Equipment: Grab bars -  tub/shower;Hand held shower head;Cane - single point (per wife no longer has RW) Additional Comments: Has DME from previous surgeries; unsure if he still has all of it. Pt says wife can work from home, but may be gone during the day. Will need to confirm  these things with wife.    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    PT Goals (current goals can now be found in the care plan section) Acute Rehab PT Goals Patient Stated Goal: to regain strength in arms Progress towards PT goals: Goals met/education completed, patient discharged from PT (await new order post-op for re-assessment)    Frequency       PT Plan Other (comment) (discharge due to surgery pending; await reorder)    Co-evaluation             End of Session Equipment Utilized During Treatment: Cervical collar;Gait belt Activity Tolerance: Patient tolerated treatment well Patient left: in chair;with call bell/phone within reach;with chair alarm set;with family/visitor present   PT Discharge Note  Patient is being discharged from PT services secondary to:  Marland Kitchen Surgery planned for 08/27/14  Please see latest Therapy Progress Note for current level of functioning and progress toward goals.  Progress and discharge plan and discussed with patient/caregiver and they  . Agree   Time: 4332-9518 PT Time Calculation (min) (ACUTE ONLY): 23 min  Charges:  $Gait Training: 8-22 mins $Therapeutic Exercise: 8-22 mins                    G Codes:      Gauri Galvao 09-01-14, 3:32 PM  Pager [phone]

## 2014-08-26 NOTE — Plan of Care (Signed)
Problem: Phase I Progression Outcomes Goal: Log roll for position change Outcome: Completed/Met Date Met:  08/26/14

## 2014-08-27 ENCOUNTER — Encounter (HOSPITAL_COMMUNITY): Payer: Self-pay | Admitting: Critical Care Medicine

## 2014-08-27 ENCOUNTER — Inpatient Hospital Stay (HOSPITAL_COMMUNITY): Payer: Medicare Other | Admitting: Anesthesiology

## 2014-08-27 ENCOUNTER — Inpatient Hospital Stay (HOSPITAL_COMMUNITY): Payer: Medicare Other

## 2014-08-27 ENCOUNTER — Encounter (HOSPITAL_COMMUNITY): Admission: EM | Disposition: A | Discharge status: Home Health | Payer: Self-pay | Source: Home / Self Care

## 2014-08-27 DIAGNOSIS — S12600A Unspecified displaced fracture of seventh cervical vertebra, initial encounter for closed fracture: Secondary | ICD-10-CM | POA: Diagnosis present

## 2014-08-27 HISTORY — PX: ANTERIOR CERVICAL DECOMP/DISCECTOMY FUSION: SHX1161

## 2014-08-27 LAB — SURGICAL PCR SCREEN
MRSA, PCR: NEGATIVE
Staphylococcus aureus: NEGATIVE

## 2014-08-27 SURGERY — ANTERIOR CERVICAL DECOMPRESSION/DISCECTOMY FUSION 1 LEVEL/HARDWARE REMOVAL
Anesthesia: General

## 2014-08-27 MED ORDER — ALUM & MAG HYDROXIDE-SIMETH 200-200-20 MG/5ML PO SUSP: 30.0000 mL | Freq: Four times a day (QID) | ORAL | Status: DC | PRN

## 2014-08-27 MED ORDER — FENTANYL CITRATE 0.05 MG/ML IJ SOLN
INTRAMUSCULAR | Status: AC
  Filled 2014-08-27: qty 2

## 2014-08-27 MED ORDER — BUPIVACAINE HCL (PF) 0.5 % IJ SOLN
INTRAMUSCULAR | Status: DC | PRN
  Administered 2014-08-27: 10 mL

## 2014-08-27 MED ORDER — VANCOMYCIN HCL IN DEXTROSE 1-5 GM/200ML-% IV SOLN
INTRAVENOUS | Status: AC
  Administered 2014-08-27: 1000 mg via INTRAVENOUS
  Filled 2014-08-27: qty 200

## 2014-08-27 MED ORDER — MIDAZOLAM HCL 2 MG/2ML IJ SOLN
INTRAMUSCULAR | Status: AC
  Filled 2014-08-27: qty 2

## 2014-08-27 MED ORDER — SENNOSIDES-DOCUSATE SODIUM 8.6-50 MG PO TABS: 1.0000 | ORAL_TABLET | Freq: Every evening | ORAL | Status: DC | PRN

## 2014-08-27 MED ORDER — FENTANYL CITRATE 0.05 MG/ML IJ SOLN
INTRAMUSCULAR | Status: AC
  Filled 2014-08-27: qty 5

## 2014-08-27 MED ORDER — PROPOFOL 10 MG/ML IV BOLUS
INTRAVENOUS | Status: DC | PRN
  Administered 2014-08-27: 40 mg via INTRAVENOUS
  Administered 2014-08-27: 120 mg via INTRAVENOUS

## 2014-08-27 MED ORDER — MIDAZOLAM HCL 2 MG/2ML IJ SOLN: 0.5000 mg | Freq: Once | INTRAMUSCULAR | Status: DC | PRN

## 2014-08-27 MED ORDER — DEXAMETHASONE SODIUM PHOSPHATE 4 MG/ML IJ SOLN
INTRAMUSCULAR | Status: AC
  Filled 2014-08-27: qty 1

## 2014-08-27 MED ORDER — PANTOPRAZOLE SODIUM 40 MG IV SOLR
40.0000 mg | Freq: Every day | INTRAVENOUS | Status: DC
  Administered 2014-08-27: 40 mg via INTRAVENOUS
  Filled 2014-08-27: qty 40

## 2014-08-27 MED ORDER — ONDANSETRON HCL 4 MG/2ML IJ SOLN
INTRAMUSCULAR | Status: AC
  Filled 2014-08-27: qty 2

## 2014-08-27 MED ORDER — PHENYLEPHRINE 40 MCG/ML (10ML) SYRINGE FOR IV PUSH (FOR BLOOD PRESSURE SUPPORT)
PREFILLED_SYRINGE | INTRAVENOUS | Status: AC
  Filled 2014-08-27: qty 10

## 2014-08-27 MED ORDER — PROMETHAZINE HCL 25 MG/ML IJ SOLN: 6.2500 mg | INTRAMUSCULAR | Status: DC | PRN

## 2014-08-27 MED ORDER — SODIUM CHLORIDE 0.9 % IJ SOLN
3.0000 mL | Freq: Two times a day (BID) | INTRAMUSCULAR | Status: DC
  Administered 2014-08-27 – 2014-08-29 (×5): 3 mL via INTRAVENOUS

## 2014-08-27 MED ORDER — ZOLPIDEM TARTRATE 5 MG PO TABS: 5.0000 mg | ORAL_TABLET | Freq: Every evening | ORAL | Status: DC | PRN

## 2014-08-27 MED ORDER — FLEET ENEMA 7-19 GM/118ML RE ENEM: 1.0000 | ENEMA | Freq: Once | RECTAL | Status: AC | PRN

## 2014-08-27 MED ORDER — ONDANSETRON HCL 4 MG/2ML IJ SOLN
INTRAMUSCULAR | Status: DC | PRN
  Administered 2014-08-27: 4 mg via INTRAVENOUS

## 2014-08-27 MED ORDER — LIDOCAINE-EPINEPHRINE 1 %-1:100000 IJ SOLN
INTRAMUSCULAR | Status: DC | PRN
  Administered 2014-08-27: 10 mL

## 2014-08-27 MED ORDER — PROPOFOL 10 MG/ML IV BOLUS
INTRAVENOUS | Status: AC
  Filled 2014-08-27: qty 20

## 2014-08-27 MED ORDER — MORPHINE SULFATE 2 MG/ML IJ SOLN: 1.0000 mg | INTRAMUSCULAR | Status: DC | PRN

## 2014-08-27 MED ORDER — ROCURONIUM BROMIDE 50 MG/5ML IV SOLN
INTRAVENOUS | Status: AC
  Filled 2014-08-27: qty 1

## 2014-08-27 MED ORDER — GLYCOPYRROLATE 0.2 MG/ML IJ SOLN
INTRAMUSCULAR | Status: DC | PRN
  Administered 2014-08-27: 0.6 mg via INTRAVENOUS

## 2014-08-27 MED ORDER — PHENOL 1.4 % MT LIQD
1.0000 | OROMUCOSAL | Status: DC | PRN
  Administered 2014-08-29: 1 via OROMUCOSAL
  Filled 2014-08-27: qty 177

## 2014-08-27 MED ORDER — SUCCINYLCHOLINE CHLORIDE 20 MG/ML IJ SOLN
INTRAMUSCULAR | Status: AC
  Filled 2014-08-27: qty 1

## 2014-08-27 MED ORDER — THROMBIN 5000 UNITS EX SOLR
CUTANEOUS | Status: DC | PRN
  Administered 2014-08-27: 10000 [IU] via TOPICAL

## 2014-08-27 MED ORDER — MIDAZOLAM HCL 5 MG/5ML IJ SOLN
INTRAMUSCULAR | Status: DC | PRN
  Administered 2014-08-27: 2 mg via INTRAVENOUS

## 2014-08-27 MED ORDER — VANCOMYCIN HCL 10 G IV SOLR
1250.0000 mg | Freq: Once | INTRAVENOUS | Status: AC
  Administered 2014-08-28: 1250 mg via INTRAVENOUS
  Filled 2014-08-27: qty 1250

## 2014-08-27 MED ORDER — BISACODYL 10 MG RE SUPP
10.0000 mg | Freq: Every day | RECTAL | Status: DC | PRN
  Administered 2014-08-29: 10 mg via RECTAL
  Filled 2014-08-27: qty 1

## 2014-08-27 MED ORDER — FENTANYL CITRATE 0.05 MG/ML IJ SOLN
INTRAMUSCULAR | Status: DC | PRN
  Administered 2014-08-27 (×5): 50 ug via INTRAVENOUS

## 2014-08-27 MED ORDER — FENTANYL CITRATE 0.05 MG/ML IJ SOLN
25.0000 ug | INTRAMUSCULAR | Status: DC | PRN
  Administered 2014-08-27 (×2): 50 ug via INTRAVENOUS

## 2014-08-27 MED ORDER — DEXAMETHASONE SODIUM PHOSPHATE 4 MG/ML IJ SOLN
INTRAMUSCULAR | Status: DC | PRN
  Administered 2014-08-27: 4 mg via INTRAVENOUS

## 2014-08-27 MED ORDER — LACTATED RINGERS IV SOLN
INTRAVENOUS | Status: DC | PRN
  Administered 2014-08-27: 15:00:00 via INTRAVENOUS

## 2014-08-27 MED ORDER — DIAZEPAM 5 MG PO TABS
5.0000 mg | ORAL_TABLET | Freq: Four times a day (QID) | ORAL | Status: DC | PRN
  Administered 2014-08-27 – 2014-08-28 (×4): 5 mg via ORAL
  Filled 2014-08-27 (×4): qty 1

## 2014-08-27 MED ORDER — NEOSTIGMINE METHYLSULFATE 10 MG/10ML IV SOLN
INTRAVENOUS | Status: DC | PRN
Start: 2014-08-27 — End: 2014-08-27
  Administered 2014-08-27: 4 mg via INTRAVENOUS

## 2014-08-27 MED ORDER — MENTHOL 3 MG MT LOZG
1.0000 | LOZENGE | OROMUCOSAL | Status: DC | PRN
  Administered 2014-08-28: 3 mg via ORAL
  Filled 2014-08-27: qty 9

## 2014-08-27 MED ORDER — LIDOCAINE HCL (CARDIAC) 20 MG/ML IV SOLN
INTRAVENOUS | Status: DC | PRN
  Administered 2014-08-27: 60 mg via INTRAVENOUS

## 2014-08-27 MED ORDER — THROMBIN 5000 UNITS EX SOLR
CUTANEOUS | Status: DC | PRN
  Administered 2014-08-27: 18:00:00 via TOPICAL

## 2014-08-27 MED ORDER — ONDANSETRON HCL 4 MG/2ML IJ SOLN: 4.0000 mg | INTRAMUSCULAR | Status: DC | PRN

## 2014-08-27 MED ORDER — LIDOCAINE HCL (CARDIAC) 20 MG/ML IV SOLN
INTRAVENOUS | Status: AC
  Filled 2014-08-27: qty 5

## 2014-08-27 MED ORDER — SENNA 8.6 MG PO TABS: 1.0000 | ORAL_TABLET | Freq: Two times a day (BID) | ORAL | Status: DC

## 2014-08-27 MED ORDER — ACETAMINOPHEN 325 MG PO TABS: 650.0000 mg | ORAL_TABLET | ORAL | Status: DC | PRN

## 2014-08-27 MED ORDER — MEPERIDINE HCL 25 MG/ML IJ SOLN: 6.2500 mg | INTRAMUSCULAR | Status: DC | PRN

## 2014-08-27 MED ORDER — HEMOSTATIC AGENTS (NO CHARGE) OPTIME
TOPICAL | Status: DC | PRN
  Administered 2014-08-27: 1 via TOPICAL

## 2014-08-27 MED ORDER — GLYCOPYRROLATE 0.2 MG/ML IJ SOLN
INTRAMUSCULAR | Status: AC
  Filled 2014-08-27: qty 3

## 2014-08-27 MED ORDER — OXYCODONE-ACETAMINOPHEN 5-325 MG PO TABS
1.0000 | ORAL_TABLET | ORAL | Status: DC | PRN
  Administered 2014-08-27 – 2014-08-28 (×4): 2 via ORAL
  Filled 2014-08-27 (×4): qty 2

## 2014-08-27 MED ORDER — SUCCINYLCHOLINE CHLORIDE 20 MG/ML IJ SOLN
INTRAMUSCULAR | Status: DC | PRN
  Administered 2014-08-27: 120 mg via INTRAVENOUS

## 2014-08-27 MED ORDER — SODIUM CHLORIDE 0.9 % IV SOLN: 250.0000 mL | INTRAVENOUS | Status: DC

## 2014-08-27 MED ORDER — SODIUM CHLORIDE 0.9 % IJ SOLN: 3.0000 mL | INTRAMUSCULAR | Status: DC | PRN

## 2014-08-27 MED ORDER — KCL IN DEXTROSE-NACL 20-5-0.45 MEQ/L-%-% IV SOLN
INTRAVENOUS | Status: DC
  Administered 2014-08-27: 21:00:00 via INTRAVENOUS
  Filled 2014-08-27: qty 1000

## 2014-08-27 MED ORDER — ACETAMINOPHEN 650 MG RE SUPP: 650.0000 mg | RECTAL | Status: DC | PRN

## 2014-08-27 MED ORDER — ROCURONIUM BROMIDE 100 MG/10ML IV SOLN
INTRAVENOUS | Status: DC | PRN
  Administered 2014-08-27: 35 mg via INTRAVENOUS
  Administered 2014-08-27: 5 mg via INTRAVENOUS

## 2014-08-27 MED ORDER — DOCUSATE SODIUM 100 MG PO CAPS: 100.0000 mg | ORAL_CAPSULE | Freq: Two times a day (BID) | ORAL | Status: DC

## 2014-08-27 SURGICAL SUPPLY — 79 items
ALLOGRAFT TRAID CC 9X11X14 (Bone Implant) ×2 IMPLANT
BENZOIN TINCTURE PRP APPL 2/3 (GAUZE/BANDAGES/DRESSINGS) IMPLANT
BIT DRILL NEURO 2X3.1 SFT TUCH (MISCELLANEOUS) ×1 IMPLANT
BLADE CLIPPER SURG (BLADE) IMPLANT
BLADE SURG 11 STRL SS (BLADE) ×2 IMPLANT
BLADE ULTRA TIP 2M (BLADE) IMPLANT
BNDG GAUZE ELAST 4 BULKY (GAUZE/BANDAGES/DRESSINGS) ×4 IMPLANT
BUR BARREL STRAIGHT FLUTE 4.0 (BURR) ×2 IMPLANT
BUR PRECISION FLUTE 5.0 (BURR) IMPLANT
CANISTER SUCT 3000ML (MISCELLANEOUS) ×2 IMPLANT
CONT SPEC 4OZ CLIKSEAL STRL BL (MISCELLANEOUS) ×2 IMPLANT
COVER MAYO STAND STRL (DRAPES) ×2 IMPLANT
DECANTER SPIKE VIAL GLASS SM (MISCELLANEOUS) ×2 IMPLANT
DRAPE C-ARM 42X72 X-RAY (DRAPES) ×2 IMPLANT
DRAPE LAPAROTOMY 100X72 PEDS (DRAPES) ×2 IMPLANT
DRAPE MICROSCOPE LEICA (MISCELLANEOUS) ×2 IMPLANT
DRAPE POUCH INSTRU U-SHP 10X18 (DRAPES) ×2 IMPLANT
DRAPE PROXIMA HALF (DRAPES) IMPLANT
DRILL NEURO 2X3.1 SOFT TOUCH (MISCELLANEOUS) ×2
DRSG PAD ABDOMINAL 8X10 ST (GAUZE/BANDAGES/DRESSINGS) IMPLANT
DRSG TELFA 3X8 NADH (GAUZE/BANDAGES/DRESSINGS) ×2 IMPLANT
DURAPREP 6ML APPLICATOR 50/CS (WOUND CARE) ×2 IMPLANT
ELECT COATED BLADE 2.86 ST (ELECTRODE) ×2 IMPLANT
ELECT REM PT RETURN 9FT ADLT (ELECTROSURGICAL) ×2
ELECTRODE REM PT RTRN 9FT ADLT (ELECTROSURGICAL) ×1 IMPLANT
GAUZE SPONGE 4X4 12PLY STRL (GAUZE/BANDAGES/DRESSINGS) ×2 IMPLANT
GAUZE SPONGE 4X4 16PLY XRAY LF (GAUZE/BANDAGES/DRESSINGS) IMPLANT
GLOVE BIO SURGEON STRL SZ8 (GLOVE) ×2 IMPLANT
GLOVE BIOGEL PI IND STRL 8 (GLOVE) ×1 IMPLANT
GLOVE BIOGEL PI IND STRL 8.5 (GLOVE) ×1 IMPLANT
GLOVE BIOGEL PI INDICATOR 8 (GLOVE) ×1
GLOVE BIOGEL PI INDICATOR 8.5 (GLOVE) ×1
GLOVE ECLIPSE 7.5 STRL STRAW (GLOVE) ×2 IMPLANT
GLOVE ECLIPSE 8.0 STRL XLNG CF (GLOVE) ×2 IMPLANT
GLOVE EXAM NITRILE LRG STRL (GLOVE) IMPLANT
GLOVE EXAM NITRILE MD LF STRL (GLOVE) IMPLANT
GLOVE EXAM NITRILE XL STR (GLOVE) IMPLANT
GLOVE EXAM NITRILE XS STR PU (GLOVE) IMPLANT
GOWN STRL REUS W/ TWL LRG LVL3 (GOWN DISPOSABLE) IMPLANT
GOWN STRL REUS W/ TWL XL LVL3 (GOWN DISPOSABLE) ×1 IMPLANT
GOWN STRL REUS W/TWL 2XL LVL3 (GOWN DISPOSABLE) ×2 IMPLANT
GOWN STRL REUS W/TWL LRG LVL3 (GOWN DISPOSABLE)
GOWN STRL REUS W/TWL XL LVL3 (GOWN DISPOSABLE) ×1
HALTER HD/CHIN CERV TRACTION D (MISCELLANEOUS) ×2 IMPLANT
HEMOSTAT POWDER SURGIFOAM 1G (HEMOSTASIS) ×2 IMPLANT
HEMOSTAT SURGICEL 2X14 (HEMOSTASIS) IMPLANT
KIT BASIN OR (CUSTOM PROCEDURE TRAY) ×2 IMPLANT
KIT ROOM TURNOVER OR (KITS) ×2 IMPLANT
LIQUID BAND (GAUZE/BANDAGES/DRESSINGS) ×2 IMPLANT
MARKER SKIN DUAL TIP RULER LAB (MISCELLANEOUS) ×2 IMPLANT
NEEDLE HYPO 18GX1.5 BLUNT FILL (NEEDLE) ×2 IMPLANT
NEEDLE HYPO 25X1 1.5 SAFETY (NEEDLE) ×2 IMPLANT
NEEDLE SPNL 22GX3.5 QUINCKE BK (NEEDLE) ×2 IMPLANT
NS IRRIG 1000ML POUR BTL (IV SOLUTION) ×2 IMPLANT
PACK LAMINECTOMY NEURO (CUSTOM PROCEDURE TRAY) ×2 IMPLANT
PAD ARMBOARD 7.5X6 YLW CONV (MISCELLANEOUS) ×2 IMPLANT
PIN DISTRACTION 14MM (PIN) ×4 IMPLANT
PIN MAYFIELD SKULL DISP (PIN) IMPLANT
PLATE ARCHON 1-LEVEL 28MM (Plate) ×2 IMPLANT
RUBBERBAND STERILE (MISCELLANEOUS) ×2 IMPLANT
SCREW ARCHON SELFTAP 4.0X13MM (Screw) ×4 IMPLANT
SCREW ARCHON SELFTAP 4.5X13 (Screw) ×4 IMPLANT
SPONGE INTESTINAL PEANUT (DISPOSABLE) ×2 IMPLANT
SPONGE SURGIFOAM ABS GEL SZ50 (HEMOSTASIS) ×2 IMPLANT
STAPLER SKIN PROX WIDE 3.9 (STAPLE) ×2 IMPLANT
STRIP CLOSURE SKIN 1/2X4 (GAUZE/BANDAGES/DRESSINGS) IMPLANT
SUT ETHILON 3 0 FSL (SUTURE) IMPLANT
SUT VIC AB 0 CT1 18XCR BRD8 (SUTURE) ×1 IMPLANT
SUT VIC AB 0 CT1 8-18 (SUTURE) ×1
SUT VIC AB 2-0 CP2 18 (SUTURE) ×2 IMPLANT
SUT VIC AB 3-0 SH 8-18 (SUTURE) ×2 IMPLANT
SYR 20ML ECCENTRIC (SYRINGE) ×2 IMPLANT
SYR 3ML LL SCALE MARK (SYRINGE) ×2 IMPLANT
TOWEL OR 17X24 6PK STRL BLUE (TOWEL DISPOSABLE) ×2 IMPLANT
TOWEL OR 17X26 10 PK STRL BLUE (TOWEL DISPOSABLE) ×2 IMPLANT
TRAP SPECIMEN MUCOUS 40CC (MISCELLANEOUS) IMPLANT
TRAY FOLEY CATH 14FRSI W/METER (CATHETERS) IMPLANT
UNDERPAD 30X30 INCONTINENT (UNDERPADS AND DIAPERS) ×2 IMPLANT
WATER STERILE IRR 1000ML POUR (IV SOLUTION) ×2 IMPLANT

## 2014-08-27 NOTE — Progress Notes (Signed)
ANTIBIOTIC CONSULT NOTE - INITIAL  Pharmacy Consult for Vancomycin Indication: Surgical Prophylaxis  Allergies  Allergen Reactions  . 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 Penicillins Hives and Itching  . Sulfonamide Derivatives Hives and Itching  . Codeine Nausea Only  . Statins Other (See Comments)    Myalgias    Patient Measurements: Height: 5' 7.5" (171.5 cm) Weight: 185 lb (83.915 kg) IBW/kg (Calculated) : 67.25   Vital Signs: Temp: 98.4 F (36.9 C) (12/09 1809) Temp Source: Oral (12/09 1809) BP: 163/95 mmHg (12/09 1809) Pulse Rate: 93 (12/09 1809) Intake/Output from previous day:   Intake/Output from this shift: Total I/O In: 800 [I.V.:800] Out: 375 [Urine:300; Blood:75]  Labs:  Recent Labs  08/26/14 0744  WBC 7.6  HGB 13.1  PLT 208  CREATININE 1.32   Estimated Creatinine Clearance: 58.3 mL/min (by C-G formula based on Cr of 1.32). No results for input(s): VANCOTROUGH, VANCOPEAK, VANCORANDOM, GENTTROUGH, GENTPEAK, GENTRANDOM, TOBRATROUGH, TOBRAPEAK, TOBRARND, AMIKACINPEAK, AMIKACINTROU, AMIKACIN in the last 72 hours.   Microbiology: Recent Results (from the past 720 hour(s))  MRSA PCR Screening     Status: None   Collection Time: 08/23/14 11:30 PM  Result Value Ref Range Status   MRSA by PCR NEGATIVE NEGATIVE Final    Comment:        The GeneXpert MRSA Assay (FDA approved for NASAL specimens only), is one component of a comprehensive MRSA colonization surveillance program. It is not intended to diagnose MRSA infection nor to guide or monitor treatment for MRSA infections.   Surgical pcr screen     Status: None   Collection Time: 08/27/14  7:06 AM  Result Value Ref Range Status   MRSA, PCR NEGATIVE NEGATIVE Final   Staphylococcus aureus NEGATIVE NEGATIVE Final    Comment:         The Xpert SA Assay (FDA approved for NASAL specimens in patients over 29 years of age), is one component of a comprehensive surveillance program.  Test performance has been validated by [employer] for patients greater than or equal to 10 year old. It is not intended to diagnose infection nor to guide or monitor treatment.     Medical History: Past Medical History  Diagnosis Date  . Asthma   . Allergic rhinitis   . Heart attack 2009  . Renal cell carcinoma 1997    Left kidney  . GERD (gastroesophageal reflux disease)   . IBS (irritable bowel syndrome)   . Diverticulosis   . History of esophageal stricture   . Internal hemorrhoids   . Depression   . Osteoarthritis   . Peripheral neuropathy   . Depression, prolonged 06/26/2013  . Hypertension   . Hyperlipidemia   . Coronary artery disease   . Sleep talking   . Snoring 12/25/2013  . 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 Cervical vertebral fusion 05/02/2014    Now presenting with C5-6 radiculopathy  . Shoulder pain, right     Medications:  Prescriptions prior to admission  Medication Sig Dispense Refill Last Dose  . albuterol (PROVENTIL HFA;VENTOLIN HFA) 108 (90 BASE) MCG/ACT inhaler Inhale 2 puffs into the lungs every 6 (six) hours as needed for wheezing or shortness of breath.   Past Month at Unknown time  . ARIPiprazole (ABILIFY) 5 MG  tablet Take 10 mg by mouth daily.    08/23/2014 at Unknown time  . aspirin 325 MG tablet Take 325 mg by mouth daily.   08/23/2014 at Unknown time  . DULoxetine (CYMBALTA) 60 MG capsule Take 60 mg by mouth daily.   08/23/2014 at Unknown time  . ezetimibe (ZETIA) 10 MG tablet Take 10 mg by mouth daily.   08/23/2014 at Unknown time  . finasteride (PROSCAR) 5 MG tablet Take 5 mg by mouth at bedtime.    08/22/2014 at Unknown time  . fish oil-omega-3 fatty acids 1000 MG capsule Take 1 g by mouth 2 (two) times daily.    08/23/2014 at Unknown time  . gemfibrozil (LOPID) 600 MG tablet Take  600 mg by mouth 2 (two) times daily.   08/23/2014 at Unknown time  . isosorbide mononitrate (IMDUR) 30 MG 24 hr tablet Take 1 tablet (30 mg total) by mouth daily. 30 tablet 4 08/22/2014 at Unknown time  . levothyroxine (SYNTHROID, LEVOTHROID) 25 MCG tablet Take 25 mcg by mouth daily before breakfast.    08/22/2014 at Unknown time  . Multiple Vitamin (MULTIVITAMIN) tablet Take 1 tablet by mouth daily.   08/23/2014 at Unknown time  . nitroGLYCERIN (NITROSTAT) 0.4 MG SL tablet Place 1 tablet (0.4 mg total) under the tongue every 5 (five) minutes as needed for chest pain. 25 tablet 3 unk  . silodosin (RAPAFLO) 8 MG CAPS capsule Take 8 mg by mouth at bedtime.    08/22/2014 at Unknown time  . vitamin C (ASCORBIC ACID) 500 MG tablet Take 500 mg by mouth 2 (two) times daily.    08/23/2014 at Unknown time  . omalizumab (XOLAIR) 150 MG injection Inject into the skin every 30 (thirty) days.    08/18/2014  . Suvorexant (BELSOMRA) 10 MG TABS Take 10 mg by mouth Nightly. (Patient not taking: Reported on 08/23/2014) 30 tablet 5 Not Taking at Unknown time   Scheduled:  . antiseptic oral rinse  7 mL Mouth Rinse q12n4p  . ARIPiprazole  5 mg Oral Daily  . bacitracin   Topical BID  . chlorhexidine  15 mL Mouth Rinse BID  . docusate sodium  100 mg Oral BID  . DULoxetine  60 mg Oral Daily  . ezetimibe  10 mg Oral Daily  . fentaNYL      . finasteride  5 mg Oral QHS  . gemfibrozil  600 mg Oral BID  . isosorbide mononitrate  30 mg Oral Daily  . levothyroxine  25 mcg Oral QAC breakfast  . multivitamin with minerals  1 tablet Oral Daily  . pantoprazole (PROTONIX) IV  40 mg Intravenous QHS  . senna  1 tablet Oral BID  . sodium chloride  3 mL Intravenous Q12H  . sodium chloride  3 mL Intravenous Q12H  . tamsulosin  0.4 mg Oral Daily   Assessment: 63yo male here for spinal surgery. Pharmacy is consulted to dose vancomycin for post-op prophylaxis. Pt does not have any drains in place and will receive one dose of vancomycin  12 hours post-op. Pt is afebrile, WBC wnl, sCr 1.32 with CrCl ~ 55 mL/min. Surgery was complete at 1652.  Goal of Therapy:  Prevention of infection  Plan:  Vancomycin 1250mg  IV once 12 hours post-op  Pharmacy will sign off for now. Please re-consult if needed.  Andrey Cota. Diona Foley, PharmD Clinical Pharmacist Pager [phone] 08/27/2014,6:38 PM

## 2014-08-27 NOTE — Progress Notes (Signed)
Rehab Admissions Coordinator Note:  Patient was screened by Cleatrice Burke for appropriateness for an Inpatient Acute Rehab Consult per Trauma PA request.   At this time, we are recommending await PT and OT reevals postop to assess dispo needs. I will follow.  Cleatrice Burke 08/27/2014, 11:31 AM  I can be reached at [phone].

## 2014-08-27 NOTE — Transfer of Care (Signed)
Immediate Anesthesia Transfer of Care Note  Patient: Jared [name]  Procedure(s) Performed: Procedure(s): Cervical six-seven anterior cervical decompression fusion with removal of hardware at Cervical five-six; Possible Cervical six-seven posterior cervical decompression and fusion (N/A) Possible Cervical six-seven posterior cervical decompression and fusion (N/A)  Patient Location: PACU  Anesthesia Type:General  Level of Consciousness: sedated  Airway & Oxygen Therapy: Patient Spontanous Breathing and Patient connected to nasal cannula oxygen  Post-op Assessment: Report given to PACU RN, Post -op Vital signs reviewed and stable and Patient moving all extremities X 4  Post vital signs: Reviewed and stable  Complications: No apparent anesthesia complications

## 2014-08-27 NOTE — Progress Notes (Signed)
Pt arrived ot unit per bed from pacu with ronda hunt, rn in pacu. Report recd from ronda hunt. Will monitor pt closely.    Angeline Slim I 08/27/2014 6:18 PM

## 2014-08-27 NOTE — Progress Notes (Signed)
Subjective: Patient reports ready for surgery  Objective: Vital signs in last 24 hours: Temp:  [97.7 F (36.5 C)-98.6 F (37 C)] 97.7 F (36.5 C) (12/09 1062) Pulse Rate:  [84-102] 84 (12/09 0613) Resp:  [18-20] 18 (12/09 0613) BP: (123-150)/(74-86) 124/86 mmHg (12/09 0613) SpO2:  [95 %-98 %] 96 % (12/09 0613)  Intake/Output from previous day:   Intake/Output this shift:    Physical Exam: Stable exam with persistent right triceps weakness.  Still painful left shoulder with limited ROM.  Lab Results:  Recent Labs  08/26/14 0744  WBC 7.6  HGB 13.1  HCT 39.6  PLT 208   BMET  Recent Labs  08/26/14 0744  NA 137  K 4.9  CL 100  CO2 25  GLUCOSE 105*  BUN 22  CREATININE 1.32  CALCIUM 9.2    Studies/Results: Dg Shoulder Left Port  08/25/2014   CLINICAL DATA:  Left shoulder pain.  EXAM: LEFT SHOULDER - 1 VIEW  COMPARISON:  April 12, 2014.  FINDINGS: There is no evidence of fracture or dislocation. There is no evidence of arthropathy or other focal bone abnormality. Soft tissues are unremarkable.  IMPRESSION: Normal left shoulder.   Electronically Signed   By: Sabino Dick M.D.   On: 08/25/2014 12:19    Assessment/Plan: Ready for surgery.  Plan ACDF, possible posterior surgery later today.    LOS: 4 days    Peggyann Shoals, MD 08/27/2014, 8:29 AM

## 2014-08-27 NOTE — Op Note (Signed)
08/23/2014 - 08/27/2014  4:36 PM  PATIENT:  Jared [name]  63 y.o. male  PRE-OPERATIVE DIAGNOSIS:  Fx Right C6-7 facet, HNP C6-7, C7 radiculopathy, subluxation C 6 on C 7  POST-OPERATIVE DIAGNOSIS:  Fx Right C6-7 facet, HNP C6-7, C7 radiculopathy, subluxation C 6 on C 7  PROCEDURE:  Procedure(s): Cervical six-seven anterior cervical decompression fusion with removal of hardware at Cervical five-six and exploration of fusion at C 5 6;  SURGEON:  Surgeon(s) and Role:    * Erline Levine, MD - Primary  PHYSICIAN ASSISTANT:   ASSISTANTS: Poteat, RN   ANESTHESIA:   general  EBL:  Total I/O In: 800 [I.V.:800] Out: 75 [Blood:75]  BLOOD ADMINISTERED:none  DRAINS: none   LOCAL MEDICATIONS USED:  LIDOCAINE   SPECIMEN:  No Specimen  DISPOSITION OF SPECIMEN:  N/A  COUNTS:  YES  TOURNIQUET:  * No tourniquets in log *  DICTATION: DICTATION: Patient is 63 year old male with subluxation of C 6 on 7 with cervical fracture and right C 7 radiculopathy, HNP.  It was elected to take him to surgery for anterior cervical decompression and fusion C 67 level with removal of cervical plate C 56 and exploration of prior fusion at this level.  PROCEDURE: Patient was brought to operating room and following the smooth and uncomplicated induction of general endotracheal anesthesia his head was placed on a horseshoe head holder he was placed in 5 pounds of Holter traction and his anterior neck was prepped and draped in usual sterile fashion. An incision was made on the left side of midline after infiltrating the skin and subcutaneous tissues with local lidocaine through previous scar tissue. The platysmal layer was incised and subplatysmal dissection was performed exposing the anterior border sternocleidomastoid muscle. Using blunt dissection the carotid sheath was kept lateral and trachea and esophagus kept medial exposing the anterior cervical spine.  Longus coli muscles were taken down from the anterior  cervical spine using electrocautery and key elevator, exposing the previously placed plate.  This was removed after carefully dissecting scar tissue. Screw holes were packed with surgifoam and prior fusion mass was carefully inspected and found to be solid. Self-retaining retractor was placed exposing the C 67 level. The anterior longitudinal ligament appeared boggy and the disc was disrupted.  The interspace was incised and a thorough discectomy was performed. Distraction pins were placed. Uncinate spurs and central spondylitic ridges were drilled down with a high-speed drill. The posterior longitudinal ligament also appeared disrupted and there was old hematoma in the interspace.  There was also hematoma overlying the right C 7 nerve root.  There was evidence of damage to the nerve sheath, which was thoroughly decompressed.  There was no evidence of CSF leakage. The spinal cord dura and both C 7 nerve roots were widely decompressed. Hemostasis was assured. After trial sizing a 9 mm lordotic allograft bone wedge was selected and packed with local autograft. This was tamped into position and countersunk appropriately. Distraction weight was removed. A 26 mm Nuvasive Archon anterior cervical plate was affixed to the cervical spine with 13 mm  Fixed angle screws 2 at C6, 2 at C7. All screws were well-positioned and locking mechanisms were engaged. A final X ray was obtained which showed well positioned upper screw and anterior plate without complicating features. Soft tissues were inspected and found to be in good repair. The wound was irrigated. The platysma layer was closed with 3-0 Vicryl stitches and the skin was reapproximated with 3-0 Vicryl  subcuticular stitches. The wound was dressed with Dermabond. Counts were correct at the end of the case. Patient was extubated and taken to recovery in stable and satisfactory condition.   PLAN OF CARE: Admit to inpatient   PATIENT DISPOSITION:  PACU - hemodynamically  stable.   Delay start of Pharmacological VTE agent (>24hrs) due to surgical blood loss or risk of bleeding: yes

## 2014-08-27 NOTE — Anesthesia Postprocedure Evaluation (Signed)
  Anesthesia Post Note  Patient: Jared [name]  Procedure(s) Performed: Procedure(s) (LRB): Cervical six-seven anterior cervical decompression fusion with removal of hardware at Cervical five-six (N/A)  Anesthesia type: GA  Patient location: PACU  Post pain: Pain level controlled  Post assessment: Post-op Vital signs reviewed  Last Vitals:  Filed Vitals:   08/27/14 1749  BP: 145/95  Pulse: 91  Temp: 36.7 C  Resp: 19    Post vital signs: Reviewed  Level of consciousness: sedated  Complications: No apparent anesthesia complications

## 2014-08-27 NOTE — Progress Notes (Signed)
Pt refuses CPAP at this time. Pt was told to call RT if he wants it later. RT will continue to monitor.

## 2014-08-27 NOTE — Progress Notes (Signed)
[facility] Trauma [facility]  Progress Note   LOS: 4 days   Subjective: Pt c/o neck pain, but well controlled.  No N/V, tolerated diet.  Ambulating OOB.  Using IS very well.  Pending OR today for neck surgery.  Objective: Vital signs in last 24 hours: Temp:  [97.7 F (36.5 C)-98.6 F (37 C)] 97.7 F (36.5 C) (12/09 2979) Pulse Rate:  [84-102] 84 (12/09 0613) Resp:  [18-20] 18 (12/09 0613) BP: (123-150)/(74-86) 124/86 mmHg (12/09 0613) SpO2:  [95 %-98 %] 96 % (12/09 0613) Last BM Date: 08/23/14  Lab Results:  CBC  Recent Labs  08/26/14 0744  WBC 7.6  HGB 13.1  HCT 39.6  PLT 208   BMET  Recent Labs  08/26/14 0744  NA 137  K 4.9  CL 100  CO2 25  GLUCOSE 105*  BUN 22  CREATININE 1.32  CALCIUM 9.2    Imaging: Dg Shoulder Left Port  08/25/2014   CLINICAL DATA:  Left shoulder pain.  EXAM: LEFT SHOULDER - 1 VIEW  COMPARISON:  April 12, 2014.  FINDINGS: There is no evidence of fracture or dislocation. There is no evidence of arthropathy or other focal bone abnormality. Soft tissues are unremarkable.  IMPRESSION: Normal left shoulder.   Electronically Signed   By: Sabino Dick M.D.   On: 08/25/2014 12:19     PE: General: pleasant, WD/WN white male who is laying in bed in NAD HEENT: head is normocephalic, c-spine collar in place. Sclera are noninjected. PERRL. Ears and nose without any masses or lesions. Mouth is pink and moist Heart: regular, rate, and rhythm. Normal s1,s2. No obvious murmurs, gallops, or rubs noted. Palpable radial and pedal pulses bilaterally Lungs: CTAB, no wheezes, rhonchi, or rales noted. Respiratory effort nonlabored Abd: soft, NT/ND, +BS, no masses, hernias, or organomegaly MS: B/L shoulder pain, right 1-3rd digit numbness improved, wrist/finger/elbow strength intact, No significant strength deficit noted to elbows/wrist/fingers/grip strength, sensation and circulation intact b/l to all 4 ext, LE atraumatic. Left shoulder  tender to the lateral delt, can not lift his arm above 90* flexion. Skin: warm and dry with no masses, lesions, abrasions to b/l knees Psych: A&Ox3 with an appropriate affect.   Assessment/Plan: Fall off roof - HD #4 Concussion - TBI teams Right C7 superior facet fracture with arm weakness/radiculopathy - MR cspine shows 3 column unstable fractures - OR for ant cervical decompression C6-7 with possible posterior decompression and fusion on 08/27/14 Previous C5-6 fusion Right 5th rib fracture - pulm toilet New left shoulder pain - xray negative, will hold off on Ortho consult right now re-eval after surgery Chronic right shoulder pain Abrasions to scalp and left ear - bacitracin CAD Hypothyroidism - synthroid Depression - abilify, cymbalta VTE - SCD's FEN - HH diet, bowel regimen started Dispo -- OR Today, On 4N, mobilize with PT/OT, SLP post-op, inpatient rehab screen   Excell Seltzer Pager: Greene PA Pager: [phone]   08/27/2014

## 2014-08-27 NOTE — Consult Note (Signed)
Reason for Consult:left shoulder pain Referring Physician: Pavan [name] is an 63 y.o. male.  HPI: 63 yo male s/p fall 15 off a ladder injuring his left shoulder and C-spine. Patient s/p ACDF by Dr [name] today.  Patient complains of persistent left shoulder pain.  I was seeing Mr Jared [name] in the office for his right shoulder already.  Currently the left shoulder hurts more than the right shoulder. No numbness or tingling in the left arm/hand.  Past Medical History  Diagnosis Date  . Asthma   . Allergic rhinitis   . Heart attack 2009  . Renal cell carcinoma 1997    Left kidney  . GERD (gastroesophageal reflux disease)   . IBS (irritable bowel syndrome)   . Diverticulosis   . History of esophageal stricture   . Internal hemorrhoids   . Depression   . Osteoarthritis   . Peripheral neuropathy   . Depression, prolonged 06/26/2013  . Hypertension   . Hyperlipidemia   . Coronary artery disease   . Sleep talking   . Snoring 12/25/2013  . 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 Cervical vertebral fusion 05/02/2014    Now presenting with C5-6 radiculopathy  . Shoulder pain, right     Past Surgical History  Procedure Laterality Date  . Back surgery  1992, 2013  . Neck surgery  2005  . Coronary stent placement    . Femur fracture surgery Left 1995  . Total knee arthroplasty Left 2005  . Carpal tunnel release Bilateral   . Nephrectomy Left     secondary to cancer  . Cardiac catheterization  05/10/2007    Minimal CAD, normal LV systolic function, medical management  . Cardiac catheterization  04/08/2008    RCA ulcerated 70-80% stenosis, stented with a 3x71m Endeavor stent at 13atm for 50sec, reduced from 80% ulcerated stenosis to 0%.  . Cardiac catheterization  05/07/2009    50% distal left main disease-IVUS or flow wire too dangerous in particular setting to perform intervention.  . Cardiac catheterization  03/18/2010    Medical management  . Cardiovascular stress  test  02/09/2012    Normal, no significant wall abnormalities noted    Family History  Problem Relation Age of Onset  . Asthma Mother   . Heart disease Mother   . Hypertension Mother   . Heart disease Paternal Grandfather   . Heart attack Paternal Grandfather 597 . Esophageal cancer Father   . Barrett's esophagus Father   . Bone cancer Father     mets from esophagus  . Heart disease Father   . Colon cancer Maternal Uncle   . Diabetes Sister   . Cancer Sister     Cervical cancer    Social History:  reports that he has never smoked. He has never used smokeless tobacco. He reports that he does not drink alcohol or use illicit drugs.  Allergies:  Allergies  Allergen Reactions  . 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 Penicillins Hives and Itching  . Sulfonamide Derivatives Hives and Itching  . Codeine Nausea Only  . Statins Other (See Comments)    Myalgias    Medications: I have reviewed the patient's current medications.  Results for orders placed or performed during the hospital encounter of  08/23/14 (from the past 48 hour(s))  CBC     Status: None   Collection Time: 08/26/14  7:44 AM  Result Value Ref Range   WBC 7.6 4.0 - 10.5 K/uL   RBC 4.58 4.22 - 5.81 MIL/uL   Hemoglobin 13.1 13.0 - 17.0 g/dL   HCT 39.6 39.0 - 52.0 %   MCV 86.5 78.0 - 100.0 fL   MCH 28.6 26.0 - 34.0 pg   MCHC 33.1 30.0 - 36.0 g/dL   RDW 14.1 11.5 - 15.5 %   Platelets 208 150 - 400 K/uL  Comprehensive metabolic panel     Status: Abnormal   Collection Time: 08/26/14  7:44 AM  Result Value Ref Range   Sodium 137 137 - 147 mEq/L   Potassium 4.9 3.7 - 5.3 mEq/L   Chloride 100 96 - 112 mEq/L   CO2 25 19 - 32 mEq/L   Glucose, Bld 105 (H) 70 - 99 mg/dL   BUN 22 6 - 23 mg/dL   Creatinine, Ser 1.32 0.50 - 1.35 mg/dL   Calcium 9.2 8.4  - 10.5 mg/dL   Total Protein 7.0 6.0 - 8.3 g/dL   Albumin 3.2 (L) 3.5 - 5.2 g/dL   AST 29 0 - 37 U/L   ALT 26 0 - 53 U/L   Alkaline Phosphatase 65 39 - 117 U/L   Total Bilirubin 0.7 0.3 - 1.2 mg/dL   GFR calc non Af Amer 55 (L) >90 mL/min   GFR calc Af Amer 64 (L) >90 mL/min    Comment: (NOTE) The eGFR has been calculated using the CKD EPI equation. This calculation has not been validated in all clinical situations. eGFR's persistently <90 mL/min signify possible Chronic Kidney Disease.    Anion gap 12 5 - 15  Surgical pcr screen     Status: None   Collection Time: 08/27/14  7:06 AM  Result Value Ref Range   MRSA, PCR NEGATIVE NEGATIVE   Staphylococcus aureus NEGATIVE NEGATIVE    Comment:        The Xpert SA Assay (FDA approved for NASAL specimens in patients over 5 years of age), is one component of a comprehensive surveillance program.  Test performance has been validated by [employer] for patients greater than or equal to 24 year old. It is not intended to diagnose infection nor to guide or monitor treatment.     Dg Cervical Spine 1 View  08/27/2014   CLINICAL DATA:  C6-7 ACDF.  EXAM: DG CERVICAL SPINE - 1 VIEW  COMPARISON:  MRI from 08/24/2014.  FINDINGS: Single cross-table lateral view cervical spine obtained portably at 1620 hrs is labeled #1. Endotracheal tube is noted.  Anterior spinal hardware is seen at C6-7, but is poorly visualized secondary to superimposition of the shoulders. A surgical sponges noted in the operative bed.  IMPRESSION: Intraoperative localization.   Electronically Signed   By: Misty Stanley M.D.   On: 08/27/2014 17:23    ROS Blood pressure 163/95, pulse 93, temperature 98.4 F (36.9 C), temperature source Oral, resp. rate 15, height 5' 7.5" (1.715 m), weight 83.915 kg (185 lb), SpO2 95 %. Physical Exam Patient resting in bed with a c-collar in place. Left [place] and AC joints nontender. No deformity. Left shoulder tender over the greater  tuberosity.  No palpable click with internal or external rotation of the shoulder.  Good abduction strength and decent external rotation strength., normal IR strength.elbow and wrist ROM without pain  Shoulder films negative for  fracture or dislocation  Assessment/Plan: Left shoulder pain after fall from 15 feet.  No evidence for fracture but he may have sustained a rotator cuff injury .  Wouuld recommend MRI L shoulder as soon as Dr [name] OKs it.  For now AROM within tolerance of pain is fine.  No sling.  Will follow.  Simmie Camerer,STEVEN R 08/27/2014, 9:29 PM

## 2014-08-27 NOTE — Progress Notes (Signed)
Awake, alert, conversant.  Still some weakness in right tricpes, but improved compared to preop.  Full strength left arm and legs.

## 2014-08-27 NOTE — Anesthesia Preprocedure Evaluation (Signed)
Anesthesia Evaluation  Patient identified by MRN, date of birth, ID band Patient awake    Reviewed: Allergy & Precautions, H&P , Patient's Chart, lab work & pertinent test results, reviewed documented beta blocker date and time   History of Anesthesia Complications Negative for: history of anesthetic complications  Airway Mallampati: III  TM Distance: >3 FB Neck ROM: limited  Mouth opening: Limited Mouth Opening  Dental   Pulmonary asthma , sleep apnea ,  breath sounds clear to auscultation        Cardiovascular Exercise Tolerance: Good hypertension, + CAD and + Past MI Rhythm:regular Rate:Normal     Neuro/Psych PSYCHIATRIC DISORDERS Depression  Neuromuscular disease    GI/Hepatic GERD-  Controlled,  Endo/Other    Renal/GU Renal disease     Musculoskeletal  (+) Arthritis -,   Abdominal   Peds  Hematology   Anesthesia Other Findings   Reproductive/Obstetrics                             Anesthesia Physical Anesthesia Plan  ASA: III  Anesthesia Plan: General ETT   Post-op Pain Management:    Induction:   Airway Management Planned:   Additional Equipment:   Intra-op Plan:   Post-operative Plan:   Informed Consent: I have reviewed the patients History and Physical, chart, labs and discussed the procedure including the risks, benefits and alternatives for the proposed anesthesia with the patient or authorized representative who has indicated his/her understanding and acceptance.   Dental Advisory Given  Plan Discussed with: CRNA and Surgeon  Anesthesia Plan Comments:         Anesthesia Quick Evaluation

## 2014-08-27 NOTE — Brief Op Note (Signed)
08/23/2014 - 08/27/2014  4:36 PM  PATIENT:  Jared [name]  63 y.o. male  PRE-OPERATIVE DIAGNOSIS:  Fx Right C6-7 facet, HNP C6-7, C7 radiculopathy, subluxation C 6 on C 7  POST-OPERATIVE DIAGNOSIS:  Fx Right C6-7 facet, HNP C6-7, C7 radiculopathy, subluxation C 6 on C 7  PROCEDURE:  Procedure(s): Cervical six-seven anterior cervical decompression fusion with removal of hardware at Cervical five-six and exploration of fusion at C 5 6;  SURGEON:  Surgeon(s) and Role:    * Erline Levine, MD - Primary  PHYSICIAN ASSISTANT:   ASSISTANTS: Poteat, RN   ANESTHESIA:   general  EBL:  Total I/O In: 800 [I.V.:800] Out: 75 [Blood:75]  BLOOD ADMINISTERED:none  DRAINS: none   LOCAL MEDICATIONS USED:  LIDOCAINE   SPECIMEN:  No Specimen  DISPOSITION OF SPECIMEN:  N/A  COUNTS:  YES  TOURNIQUET:  * No tourniquets in log *  DICTATION: DICTATION: Patient is 63 year old male with subluxation of C 6 on 7 with cervical fracture and right C 7 radiculopathy, HNP.  It was elected to take him to surgery for anterior cervical decompression and fusion C 67 level with removal of cervical plate C 56 and exploration of prior fusion at this level.  PROCEDURE: Patient was brought to operating room and following the smooth and uncomplicated induction of general endotracheal anesthesia his head was placed on a horseshoe head holder he was placed in 5 pounds of Holter traction and his anterior neck was prepped and draped in usual sterile fashion. An incision was made on the left side of midline after infiltrating the skin and subcutaneous tissues with local lidocaine through previous scar tissue. The platysmal layer was incised and subplatysmal dissection was performed exposing the anterior border sternocleidomastoid muscle. Using blunt dissection the carotid sheath was kept lateral and trachea and esophagus kept medial exposing the anterior cervical spine.  Longus coli muscles were taken down from the anterior  cervical spine using electrocautery and key elevator, exposing the previously placed plate.  This was removed after carefully dissecting scar tissue. Screw holes were packed with surgifoam and prior fusion mass was carefully inspected and found to be solid. Self-retaining retractor was placed exposing the C 67 level. The anterior longitudinal ligament appeared boggy and the disc was disrupted.  The interspace was incised and a thorough discectomy was performed. Distraction pins were placed. Uncinate spurs and central spondylitic ridges were drilled down with a high-speed drill. The posterior longitudinal ligament also appeared disrupted and there was old hematoma in the interspace.  There was also hematoma overlying the right C 7 nerve root.  There was evidence of damage to the nerve sheath, which was thoroughly decompressed.  There was no evidence of CSF leakage. The spinal cord dura and both C 7 nerve roots were widely decompressed. Hemostasis was assured. After trial sizing a 9 mm lordotic allograft bone wedge was selected and packed with local autograft. This was tamped into position and countersunk appropriately. Distraction weight was removed. A 26 mm Nuvasive Archon anterior cervical plate was affixed to the cervical spine with 13 mm  Fixed angle screws 2 at C6, 2 at C7. All screws were well-positioned and locking mechanisms were engaged. A final X ray was obtained which showed well positioned upper screw and anterior plate without complicating features. Soft tissues were inspected and found to be in good repair. The wound was irrigated. The platysma layer was closed with 3-0 Vicryl stitches and the skin was reapproximated with 3-0 Vicryl  subcuticular stitches. The wound was dressed with Dermabond. Counts were correct at the end of the case. Patient was extubated and taken to recovery in stable and satisfactory condition.   PLAN OF CARE: Admit to inpatient   PATIENT DISPOSITION:  PACU - hemodynamically  stable.   Delay start of Pharmacological VTE agent (>24hrs) due to surgical blood loss or risk of bleeding: yes

## 2014-08-27 NOTE — Anesthesia Procedure Notes (Signed)
Procedure Name: Intubation Date/Time: 08/27/2014 3:07 PM Performed by: Carola Frost Pre-anesthesia Checklist: Patient identified, Timeout performed, Emergency Drugs available, Suction available and Patient being monitored Patient Re-evaluated:Patient Re-evaluated prior to inductionOxygen Delivery Method: Circle system utilized Preoxygenation: Pre-oxygenation with 100% oxygen Intubation Type: IV induction Ventilation: Mask ventilation without difficulty Grade View: Grade I Tube type: Oral Tube size: 7.5 mm Number of attempts: 1 Airway Equipment and Method: Stylet and Video-laryngoscopy Placement Confirmation: CO2 detector,  positive ETCO2,  ETT inserted through vocal cords under direct vision and breath sounds checked- equal and bilateral Secured at: 23 cm Tube secured with: Tape Dental Injury: Teeth and Oropharynx as per pre-operative assessment  Comments: Pt in C-collar.  C-collar left in place during induction. Head and neck neutral.  Grade 1 view with glidescope. Atraumatic intubation.

## 2014-08-28 ENCOUNTER — Inpatient Hospital Stay (HOSPITAL_COMMUNITY): Payer: Medicare Other

## 2014-08-28 MED ORDER — DIAZEPAM 2 MG PO TABS
2.0000 mg | ORAL_TABLET | Freq: Three times a day (TID) | ORAL | Status: DC | PRN
  Administered 2014-08-29: 2 mg via ORAL
  Filled 2014-08-28: qty 1

## 2014-08-28 MED ORDER — PANTOPRAZOLE SODIUM 40 MG PO TBEC
40.0000 mg | DELAYED_RELEASE_TABLET | Freq: Every day | ORAL | Status: DC
  Administered 2014-08-28 – 2014-08-30 (×3): 40 mg via ORAL
  Filled 2014-08-28 (×3): qty 1

## 2014-08-28 MED ORDER — MORPHINE SULFATE 2 MG/ML IJ SOLN: 1.0000 mg | INTRAMUSCULAR | Status: DC | PRN

## 2014-08-28 NOTE — Progress Notes (Signed)
Subjective: Patient reports "My left shoulder is giving me more trouble than my right now"  Objective: Vital signs in last 24 hours: Temp:  [97.9 F (36.6 C)-98.5 F (36.9 C)] 98.2 F (36.8 C) (12/10 0538) Pulse Rate:  [81-98] 90 (12/10 0604) Resp:  [12-20] 20 (12/10 0538) BP: (128-166)/(78-100) 140/88 mmHg (12/10 0604) SpO2:  [91 %-96 %] 94 % (12/10 0538)  Intake/Output from previous day: 12/09 0701 - 12/10 0700 In: 1040 [P.O.:240; I.V.:800] Out: 375 [Urine:300; Blood:75] Intake/Output this shift:    Alert, conversant, finishing breakfast.  Vista Collar in use. Incision with Dermabond. No erythema, swelling, or drainage. No dysphagia reported with breakfast. RUE strength improved, but not full. Good strength LUE, except abduction. Good strength BLE.    Lab Results:  Recent Labs  08/26/14 0744  WBC 7.6  HGB 13.1  HCT 39.6  PLT 208   BMET  Recent Labs  08/26/14 0744  NA 137  K 4.9  CL 100  CO2 25  GLUCOSE 105*  BUN 22  CREATININE 1.32  CALCIUM 9.2    Studies/Results: Dg Cervical Spine 1 View  08/27/2014   CLINICAL DATA:  C6-7 ACDF.  EXAM: DG CERVICAL SPINE - 1 VIEW  COMPARISON:  MRI from 08/24/2014.  FINDINGS: Single cross-table lateral view cervical spine obtained portably at 1620 hrs is labeled #1. Endotracheal tube is noted.  Anterior spinal hardware is seen at C6-7, but is poorly visualized secondary to superimposition of the shoulders. A surgical sponges noted in the operative bed.  IMPRESSION: Intraoperative localization.   Electronically Signed   By: Misty Stanley M.D.   On: 08/27/2014 17:23    Assessment/Plan: Improving   LOS: 5 days  Appreciate Dr. Veverly Fells' visit and recommendations. Ok per DrStern to proceed with left shoulder MRI today. Order entered. Also ok per DrStern for pt to shower (in collar).    Verdis Prime 08/28/2014, 8:48 AM

## 2014-08-28 NOTE — Progress Notes (Signed)
   Subjective: 1 Day Post-Op Procedure(s) (LRB): Cervical six-seven anterior cervical decompression fusion with removal of hardware at Cervical five-six (N/A)  Pt having MRI when I visited the room at lunch time  Reviewed MRI with Dr. Veverly Fells showing a superior labral tear with contusion to superior shoulder but no signs of rotator cuff tear or fracture I called the patient and discussed results over the phone Patient reports pain as mild.  Objective:   VITALS:   PE not completed  LABS  Recent Labs  08/26/14 0744  HGB 13.1  HCT 39.6  WBC 7.6  PLT 208     Recent Labs  08/26/14 0744  NA 137  K 4.9  BUN 22  CREATININE 1.32  GLUCOSE 105*     Assessment/Plan: 1 Day Post-Op Procedure(s) (LRB): Cervical six-seven anterior cervical decompression fusion with removal of hardware at Cervical five-six (N/A) MRI shows labral tear but no surgical intervention recommended at this time Patient is welcome to f/u with our office for bilateral shoulders once he is discharged We will sign off for now  Please contact us for any further orthopedic concerns     Merla Riches, MPAS, PA-C  08/28/2014, 2:51 PM

## 2014-08-28 NOTE — Progress Notes (Signed)
Pt's wife noted some confusion today. Pt alert and oriented x4 but forgetful. MD made aware. Will continue to monitor.

## 2014-08-28 NOTE — Clinical Social Work Note (Signed)
CSW Consult Acknowledged:   CSW received consult for SNF placement. CSW reviewed chart. Per PT pt can transition home with home PT.  CSW has signed off.   Ricardo, MSW, [place]

## 2014-08-28 NOTE — Progress Notes (Signed)
Noted PT eval post op. Home health is recommended. [phone]

## 2014-08-28 NOTE — Progress Notes (Signed)
Speech Language Pathology Treatment: Cognitive-Linquistic  Patient Details Name: Jared [name] MRN: [id number] DOB: 07/14/1951 Today's Date: 08/28/2014 Time: 0954-1027 SLP Time Calculation (min) (ACUTE ONLY): 33 min  Assessment / Plan / Recommendation Clinical Impression  Pt visit for cognitive treatment focusing on memory strategies as this was primary area of deficit on MOCA test two days prior.  Pt independently recalled SLP, score on MOCA and 3 words of MOCA testing!    Provided pt with list of memory compensation strategies - pt reviewed them aloud and informed this SLP that he is incorporating many of said strategies.  Per RN, pt is calling for assistance when needed but has a chair alarm set.  Pt mentioned walker was on other side of room to this SLP- SLP advised possibly was left there to prevent him from getting up independently to which he concurred.    Pt demonstrated divided attention to task by talking with trauma MD who visited and this SLP during MD visit.  Advised pt to have his wife assure he is taking his medications as indicated and provided options - use of medicine container with alarm, setting alarm on cell phone, etc.  He assured this clinician that he will comply with recommendations.    Provided written handouts in room re: TBI including information from a webpage that contains an entire book on TBI.  At this time, pt's cognitive linguistic skills are functional for his current environment and his level of demands (pt is retired).  Will sign off as all education is completed and pt has met goals.     HPI HPI: 63 yo male adm to [facility] after falling 15+ feet from roof of house with + LOC.  Pt had right 5th rib fracture, per note plans for cervical spine surgery 12/9.  Cognitive evaluation ordered.     Pertinent Vitals Pain Assessment:  (no c/o pain during session) Pain Score: 7  Pain Location: neck>Lt shoulder Pain Intervention(s): Premedicated before session;Monitored  during session;Limited activity within patient's tolerance;Repositioned  SLP Plan   (all education completed)    Recommendations              Follow up Recommendations: None Plan:  (all education completed)    GO      , MS CCC SLP [phone]    

## 2014-08-28 NOTE — Evaluation (Signed)
Physical Therapy Evaluation Patient Details Name: Jared [name] MRN: [id number] DOB: May 29, 1951 Today's Date: 08/28/2014   History of Present Illness  Adm 08/23/14 s/p fall from ladder 15 ft; +LOC, +Rt 5th rib fx; + C6-7 subluxation related to posterior element injury, Fractured RIGHT C6-7 facet. Posterolateral disc protrusion/extrusion at C6-7, narrowing both neural foramina compressing both C7 nerve roots. 12/09 removal C5-6 hardware with C6-7 ACDF. PMHx-Lt TKA; Lt femur fx; Lt nephrectomy 2/2 cancer; chronic Rt shoulder pain; c5-6 fusion; pt reports recent inner ear/vertigo problems  Clinical Impression  Patient is s/p above surgery resulting in functional limitations due to the deficits listed below (see PT Problem List). Pt's cognition/memory significantly more impaired this date with wife frequently correcting information pt had provided. Initiated education with wife re: TBI and potential cognitive/behavioral changes. Patient will benefit from skilled PT to increase their independence and safety with mobility to allow discharge to the venue listed below.       Follow Up Recommendations Home health PT    Equipment Recommendations  Rolling walker with 5" wheels    Recommendations for Other Services       Precautions / Restrictions Precautions Precautions: Cervical Precaution Comments: educated on precautions  Required Braces or Orthoses: Cervical Brace Cervical Brace: Hard collar;At all times      Mobility  Bed Mobility Overal bed mobility: Needs Assistance Bed Mobility: Rolling;Sidelying to Sit Rolling: Supervision Sidelying to sit: Min guard       General bed mobility comments: HOB 0, no rail; vc for sequencing (pt reported earlier attempted supine to sit with incr neck pain);   Transfers Overall transfer level: Needs assistance Equipment used: Rolling walker (2 wheeled) Transfers: Sit to/from Stand Sit to Stand: Min guard         General transfer comment:  vc for safe use of RW (despite questioning pt re: proper technique, he still could not remember--was educated prior to surgery)  Ambulation/Gait Ambulation/Gait assistance: Min guard;Min assist Ambulation Distance (Feet): 170 Feet Assistive device: Rolling walker (2 wheeled);None Gait Pattern/deviations: Step-through pattern;Decreased stride length Gait velocity: decr   General Gait Details: guarded and slow; steady and walked last 20 ft without RW to assess balance (no problems)  Stairs Stairs: Yes Stairs assistance: Min guard Stair Management: One rail Left;Step to pattern;Forwards Number of Stairs: 2 General stair comments: also educated on 1 step with RW, however on return to room, wife stated there were 2 steps up to master bedroom level (not 1 as pt had stated); no rail; he can stay in guest room on main level  Wheelchair Mobility    Modified Rankin (Stroke Patients Only)       Balance           Standing balance support: No upper extremity supported Standing balance-Leahy Scale: Fair                               Pertinent Vitals/Pain Pain Assessment: 0-10 Pain Score: 7  Pain Location: neck>Lt shoulder Pain Intervention(s): Premedicated before session;Monitored during session;Limited activity within patient's tolerance;Repositioned    Home Living Family/patient expects to be discharged to:: Private residence Living Arrangements: Spouse/significant other Available Help at Discharge: Family;Available 24 hours/day (wife self-employed) Type of Home: House Home Access: Stairs to enter Entrance Stairs-Rails: Left Entrance Stairs-Number of Steps: 2 Home Layout: Multi-level;Able to live on main level with bedroom/bathroom Home Equipment: Grab bars - tub/shower;Hand held shower head;Cane - single point (per wife  no longer has RW)      Prior Function Level of Independence: Independent         Comments: 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      Hand Dominance   Dominant Hand: Right    Extremity/Trunk Assessment   Upper Extremity Assessment: Defer to OT evaluation (able to grip and maneuver RW)           Lower Extremity Assessment: Overall WFL for tasks assessed      Cervical / Trunk Assessment: Normal  Communication   Communication: No difficulties  Cognition Arousal/Alertness: Awake/alert Behavior During Therapy: WFL for tasks assessed/performed Overall Cognitive Status: Impaired/Different from baseline (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) Area of Impairment: Memory     Memory: Decreased short-term memory         General Comments: pt stated he had pain medicine at beginning of session; at end of session he stated he had asked for pain medicine and never got it (he had)--he did not recall telling me he got the medicine at beg of session    General Comments General comments (skin integrity, edema, etc.): Wife present and both educated on potential cognitive deficits post TBI     Exercises        Assessment/Plan    PT Assessment Patient needs continued PT services  PT Diagnosis Difficulty walking;Acute pain   PT Problem List Decreased strength;Decreased activity tolerance;Decreased balance;Decreased mobility;Decreased knowledge of use of DME;Decreased knowledge of precautions;Impaired sensation;Pain;Decreased cognition  PT Treatment Interventions DME instruction;Gait training;Stair training;Functional mobility training;Therapeutic activities;Patient/family education;Cognitive remediation   PT Goals (Current goals can be found in the Care Plan section) Acute Rehab PT Goals Patient Stated Goal: to walk without RW PT Goal Formulation: With patient Time For Goal Achievement: 09/01/14 Potential to Achieve Goals: Good    Frequency Min 5X/week   Barriers to discharge        Co-evaluation               End of Session Equipment Utilized During  Treatment: Cervical collar;Gait belt Activity Tolerance: Patient tolerated treatment well Patient left: in chair;with call bell/phone within reach;with chair alarm set;with family/visitor present           Time: 9811-9147 PT Time Calculation (min) (ACUTE ONLY): 38 min   Charges:   PT Evaluation $Initial PT Evaluation Tier I: 1 Procedure PT Treatments $Gait Training: 8-22 mins   PT G Codes:          Jermon Chalfant 09/18/14, 9:31 AM Pager [phone]

## 2014-08-28 NOTE — Progress Notes (Signed)
Patient ID: Jared [name], male   DOB: 1951/09/09, 63 y.o.   MRN: [id number] 1 Day Post-Op  Subjective: Up in chair, feeling better, working with ST  Objective: Vital signs in last 24 hours: Temp:  [97.8 F (36.6 C)-98.4 F (36.9 C)] 97.8 F (36.6 C) (12/10 0957) Pulse Rate:  [88-103] 103 (12/10 0957) Resp:  [12-20] 18 (12/10 0957) BP: (121-166)/(74-100) 121/74 mmHg (12/10 0957) SpO2:  [91 %-96 %] 93 % (12/10 0957) Last BM Date: 08/27/14  Intake/Output from previous day: 12/09 0701 - 12/10 0700 In: 1040 [P.O.:240; I.V.:800] Out: 375 [Urine:300; Blood:75] Intake/Output this shift:    General appearance: alert and cooperative Neck: collar Resp: clear to auscultation bilaterally Cardio: regular rate and rhythm GI: soft, NT  Neuro: see NS note  Lab Results: CBC   Recent Labs  08/26/14 0744  WBC 7.6  HGB 13.1  HCT 39.6  PLT 208   BMET  Recent Labs  08/26/14 0744  NA 137  K 4.9  CL 100  CO2 25  GLUCOSE 105*  BUN 22  CREATININE 1.32  CALCIUM 9.2   PT/INR No results for input(s): LABPROT, INR in the last 72 hours. ABG No results for input(s): PHART, HCO3 in the last 72 hours.  Invalid input(s): PCO2, PO2  Studies/Results: Dg Cervical Spine 1 View  08/27/2014   CLINICAL DATA:  C6-7 ACDF.  EXAM: DG CERVICAL SPINE - 1 VIEW  COMPARISON:  MRI from 08/24/2014.  FINDINGS: Single cross-table lateral view cervical spine obtained portably at 1620 hrs is labeled #1. Endotracheal tube is noted.  Anterior spinal hardware is seen at C6-7, but is poorly visualized secondary to superimposition of the shoulders. A surgical sponges noted in the operative bed.  IMPRESSION: Intraoperative localization.   Electronically Signed   By: Misty Stanley M.D.   On: 08/27/2014 17:23    Anti-infectives: Anti-infectives    Start     Dose/Rate Route Frequency Ordered Stop   08/28/14 0430  vancomycin (VANCOCIN) 1,250 mg in sodium chloride 0.9 % 250 mL IVPB     1,250 mg166.7 mL/hr over  90 Minutes Intravenous  Once 08/27/14 1846 08/28/14 0614   08/27/14 1452  vancomycin (VANCOCIN) 1 GM/200ML IVPB    Comments:  Merrilyn Puma   : cabinet override      08/27/14 1452 08/27/14 1610   08/23/14 1715  ceFAZolin (ANCEF) IVPB 1 g/50 mL premix     1 g100 mL/hr over 30 Minutes Intravenous  Once 08/23/14 1710 08/23/14 1827      Assessment/Plan: Fall off roof - HD #5 Concussion - TBI teams Right C7 superior facet fracture with arm weakness/radiculopathy - S/A  ant cervical decompression C6-7 by Dr. [name] Previous C5-6 fusion Right 5th rib fracture - pulm toilet New left shoulder pain - appreciate Dr. Veverly Fells' eval, MR P Chronic right shoulder pain Abrasions to scalp and left ear - bacitracin CAD Hypothyroidism - synthroid Depression - abilify, cymbalta VTE - SCD's FEN - HH diet, bowel regimen Dispo -- therapies, suspect will progress to [facility] level  LOS: 5 days    Georganna Skeans, MD, MPH, FACS Trauma: [phone] General Surgery: [phone]  08/28/2014

## 2014-08-28 NOTE — Progress Notes (Signed)
Pt refuses CPAP at this time. CPAP is in the room if he wants to use it later. RT will continue to monitor.

## 2014-08-28 NOTE — Plan of Care (Signed)
Problem: Phase I Progression Outcomes Goal: Pain controlled with appropriate interventions Outcome: Progressing Goal: OOB as tolerated unless otherwise ordered Outcome: Completed/Met Date Met:  08/28/14 Goal: Initial discharge plan identified Outcome: Progressing Goal: PT/OT consults requested Outcome: Completed/Met Date Met:  08/28/14     

## 2014-08-28 NOTE — Progress Notes (Signed)
UR completed.  New PT eval indicating home w/ HH is appropriate level of care for discharge. Will make necessary arrangements with provider orders.   Sandi Mariscal, RN BSN [place] CCM Trauma/Neuro ICU Case Manager [phone]

## 2014-08-29 MED ORDER — OXYCODONE HCL 5 MG PO TABS
5.0000 mg | ORAL_TABLET | ORAL | Status: DC | PRN
Start: 2014-08-29 — End: 2014-08-30
  Administered 2014-08-29: 10 mg via ORAL
  Administered 2014-08-29 – 2014-08-30 (×3): 15 mg via ORAL
  Filled 2014-08-29: qty 3
  Filled 2014-08-29: qty 2
  Filled 2014-08-29 (×2): qty 3

## 2014-08-29 MED ORDER — DEXAMETHASONE 4 MG PO TABS
4.0000 mg | ORAL_TABLET | Freq: Three times a day (TID) | ORAL | Status: DC
  Administered 2014-08-29 – 2014-08-30 (×2): 4 mg via ORAL
  Filled 2014-08-29 (×2): qty 1

## 2014-08-29 MED ORDER — MAGNESIUM CITRATE PO SOLN
1.0000 | Freq: Once | ORAL | Status: AC
  Administered 2014-08-29: 1 via ORAL
  Filled 2014-08-29: qty 296

## 2014-08-29 MED ORDER — HYDROMORPHONE HCL 1 MG/ML IJ SOLN: 0.5000 mg | INTRAMUSCULAR | Status: DC | PRN

## 2014-08-29 NOTE — Progress Notes (Signed)
RT note:  Pt has refused cpap at this time and asked rt to remove.  Rt will continue to  Monitor.

## 2014-08-29 NOTE — Progress Notes (Signed)
Occupational Therapy Treatment Patient Details Name: Jared [name] MRN: [id number] DOB: 04-06-1951 Today's Date: 08/29/2014    History of present illness Adm 08/23/14 s/p fall from ladder 15 ft; +LOC, +Rt 5th rib fx; + C6-7 subluxation related to posterior element injury, Fractured RIGHT C6-7 facet. Posterolateral disc protrusion/extrusion at C6-7, narrowing both neural foramina compressing both C7 nerve roots. 12/09 removal C5-6 hardware with C6-7 ACDF. PMHx-Lt TKA; Lt femur fx; Lt nephrectomy 2/2 cancer; chronic Rt shoulder pain; c5-6 fusion; pt reports recent inner ear/vertigo problems   OT comments  Pt progressing well with OT. Pt demonstrates cognitive deficits during session and recommend follow up for cognitive recovery at this time. Pt could benefit from outpatient follow up for cognition after home health OT.     Follow Up Recommendations  Home health OT (cognitive recovery)    Equipment Recommendations  Tub/shower seat    Recommendations for Other Services      Precautions / Restrictions Precautions Precautions: Cervical Precaution Comments: educated on precautions  Required Braces or Orthoses: Cervical Brace Cervical Brace: Hard collar;At all times       Mobility Bed Mobility               General bed mobility comments: discussed home setup and HOB elevation  Transfers                      Balance                                   ADL Overall ADL's : Needs assistance/impaired                                       General ADL Comments: Pt and wife educated on don doff ccollar in sitting for posterior flap and supine for anterior flap. Wife educated on changing pads and hand washing. Trauma PA ordering new pads for brace at this time. Pt and wife educate don home safety, showering, toilet hygiene , bed mobility and car transfer. Wife presenting pictures of home and educated PT will address stair transfers. PT and  wife without further questions at this time. Pt demonstrats some cognitive deficits presently .( inability to recall questions, needing extended time to answer)      Vision                     Perception     Praxis      Cognition   Behavior During Therapy: [facility] for tasks assessed/performed Overall Cognitive Status: Impaired/Different from baseline Area of Impairment: Memory;Problem solving     Memory: Decreased short-term memory        Problem Solving: Slow processing      Extremity/Trunk Assessment               Exercises Other Exercises Other Exercises: educated on UE supported sitting in chair to decr strain on cervical area   Shoulder Instructions       General Comments      Pertinent Vitals/ Pain       Pain Assessment: No/denies pain  Home Living  Prior Functioning/Environment              Frequency Min 3X/week     Progress Toward Goals  OT Goals(current goals can now be found in the care plan section)  Progress towards OT goals: Progressing toward goals  Acute Rehab OT Goals Patient Stated Goal: to walk without RW OT Goal Formulation: With patient Time For Goal Achievement: 09/09/14 Potential to Achieve Goals: Good ADL Goals Pt Will Perform Upper Body Bathing: with modified independence;sitting Pt Will Perform Lower Body Dressing: with supervision;with adaptive equipment;sit to/from stand Pt Will Perform Tub/Shower Transfer: Tub transfer;with supervision;ambulating;rolling walker Additional ADL Goal #1: Pt will verbalize and adhere to cervical precautions with 100% accuracy prior to discharge.  Plan Discharge plan needs to be updated    Co-evaluation                 End of Session     Activity Tolerance Patient tolerated treatment well   Patient Left in chair;with call bell/phone within reach;with family/visitor present   Nurse Communication Mobility  status;Precautions        Time: 1157-2620 OT Time Calculation (min): 26 min  Charges: OT General Charges $OT Visit: 1 Procedure OT Evaluation $OT Re-eval: 1 Procedure OT Treatments $Self Care/Home Management : 8-22 mins  Jared [name] 08/29/2014, 1:32 PM  Pager: [phone]

## 2014-08-29 NOTE — Discharge Summary (Signed)
Physician Discharge Summary  Patient ID: Jared [name] MRN: [id number] DOB/AGE: [birth year] 63 y.o.  Admit date: 08/23/2014 Discharge date: 08/30/2014  Discharge Diagnoses Patient Active Problem List   Diagnosis Date Noted  . C7 cervical fracture 08/27/2014  . Cervical spine fracture 08/23/2014  . Abnormal brain MRI 07/23/2014  . 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 Cervical vertebral fusion 05/02/2014  . Snoring 12/25/2013  . 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 Insomnia due to mental condition 12/25/2013  . Sleep talking   . Essential hypertension 07/08/2013  . Depression, prolonged 06/26/2013  . Night sweats 06/26/2013  . Chest pain at rest 06/25/2013  . Bronchiectasis without acute exacerbation 01/16/2008  . PULMONARY NODULE 12/20/2007  . HYPERNEPHROMA 11/27/2007  . DYSLIPIDEMIA 11/27/2007  . Coronary atherosclerosis 11/27/2007  . HEMORRHOIDS, INTERNAL 11/27/2007  . ASTHMA 11/27/2007  . ESOPHAGITIS 11/27/2007  . ESOPHAGEAL STRICTURE 11/27/2007  . GERD 11/27/2007  . GASTRITIS, ACUTE 11/27/2007  . DIVERTICULOSIS, COLON 11/27/2007  . ABDOMINAL PAIN, LEFT UPPER QUADRANT 11/27/2007  . HIP FRACTURE, LEFT 11/27/2007  . FRACTURE, TIBIA 11/27/2007  . BENIGN PROSTATIC HYPERTROPHY, HX OF 11/27/2007    Consultants Dr. Erline Levine for neurosurgery  Dr. Esmond Plants for orthopedic surgery  Imaging: MRI Left shoulder IMPRESSION: 1. Superior subcutaneous edema, likely related to direct impact. No focal fluid collection or fracture demonstrated. 2. Superior labral degeneration without well-defined tear or paralabral cyst. 3. Supraspinatus tendinosis and bursal surface fraying/contusion. No evidence of full-thickness rotator cuff tear.  Procedures 12/9 -- Cervical six-seven anterior cervical decompression fusion with removal of hardware at Cervical five-six and exploration of fusion at C5-6 by Dr. [name]   HPI: Jared [name] fell 15 feet from a ladder while  cleaning gutters. He had a loss of consciousness and came to on his own. He immediately had neck pain and noted that his right arm was weak. He denied shortness of breath. He had previously had radiculopathy from disc disease in the neck and had surgery (fusion) with Dr. Joya Salm.His workup included CT scans of the head, cervical spine, chest, abdomen, and pelvis and showed the C7 fracture and aright rib fracture. He was admitted and neurosurgery was consulted. Orthopedic surgery was also consulted for bilateral shoulder pain with negative radiographs.   Hospital Course: Neurosurgery recommended operative treatment secondary to a 3 column unstable followed by time in a cervical collar. Orthopedic surgery obtained further imaging and did not find any acute pathology and recommended OP follow up. He received his neck surgery several days later and tolerated it well.  He was started on dexamethasone 4mg  TID, but this was discontinued prior to discharge.  He was mobilized with physical and occupational therapies who recommended home health therapies.  This was set up prior to discharge.  His pain was controlled with oral medications, he was tolerating a regular diet, and was able to be discharged home in good condition in the care of his wife.  He will follow up with Dr. [name] in 2-3 weeks for a recheck and maintain his c-collar until Dr. [name] says he can discontinue this.  He also know's he is not allowed to drive until out of his c-collar.        Medication List    TAKE these medications        albuterol 108 (90 BASE) MCG/ACT inhaler  Commonly known as:  PROVENTIL HFA;VENTOLIN HFA  Inhale 2 puffs into the lungs every 6 (six) hours as needed for wheezing or shortness of breath.  ARIPiprazole 5 MG tablet  Commonly known as:  ABILIFY  Take 10 mg by mouth daily.     aspirin 325 MG tablet  Take 325 mg by mouth daily.     bacitracin ointment  Apply topically 2 (two) times daily.     diazepam  2 MG tablet  Commonly known as:  VALIUM  Take 1 tablet (2 mg total) by mouth every 8 (eight) hours as needed for muscle spasms.     DSS 100 MG Caps  Take 100 mg by mouth 2 (two) times daily.     DULoxetine 60 MG capsule  Commonly known as:  CYMBALTA  Take 60 mg by mouth daily.     ezetimibe 10 MG tablet  Commonly known as:  ZETIA  Take 10 mg by mouth daily.     finasteride 5 MG tablet  Commonly known as:  PROSCAR  Take 5 mg by mouth at bedtime.     fish oil-omega-3 fatty acids 1000 MG capsule  Take 1 g by mouth 2 (two) times daily.     gemfibrozil 600 MG tablet  Commonly known as:  LOPID  Take 600 mg by mouth 2 (two) times daily.     isosorbide mononitrate 30 MG 24 hr tablet  Commonly known as:  IMDUR  Take 1 tablet (30 mg total) by mouth daily.     levothyroxine 25 MCG tablet  Commonly known as:  SYNTHROID, LEVOTHROID  Take 25 mcg by mouth daily before breakfast.     multivitamin tablet  Take 1 tablet by mouth daily.     nitroGLYCERIN 0.4 MG SL tablet  Commonly known as:  NITROSTAT  Place 1 tablet (0.4 mg total) under the tongue every 5 (five) minutes as needed for chest pain.     oxyCODONE 5 MG immediate release tablet  Commonly known as:  Oxy IR/ROXICODONE  Take 1-3 tablets (5-15 mg total) by mouth every 4 (four) hours as needed (5mg  for mild pain, 10mg  for moderate pain, 15mg  for severe pain).     polyethylene glycol packet  Commonly known as:  MIRALAX / GLYCOLAX  Take 17 g by mouth daily as needed for mild constipation.     RAPAFLO 8 MG Caps capsule  Generic drug:  silodosin  Take 8 mg by mouth at bedtime.     senna 8.6 MG Tabs tablet  Commonly known as:  SENOKOT  Take 1 tablet (8.6 mg total) by mouth 2 (two) times daily.     Suvorexant 10 MG Tabs  Commonly known as:  BELSOMRA  Take 10 mg by mouth Nightly.     vitamin C 500 MG tablet  Commonly known as:  ASCORBIC ACID  Take 500 mg by mouth 2 (two) times daily.     XOLAIR 150 MG injection  Generic  drug:  omalizumab  Inject into the skin every 30 (thirty) days.         Follow-up Information    Follow up with [facility].   Why:  They will contact you for therapy visits.    Contact information:   [address] [place] [address] [phone]       Follow up with Peggyann Shoals, MD. Schedule an appointment as soon as possible for a visit in 2 weeks.   Specialty:  Neurosurgery   Why:  For post-operation check   Contact information:   [address] [place] [address] [phone]  Follow up with NORRIS,STEVEN R, MD. Schedule an appointment as soon as possible for a visit in 3 weeks.   Specialty:  Orthopedic Surgery   Why:  For post-hospital follow up regarding your shoulder pain if needed   Contact information:   [address] [place] [address] [phone]       Follow up with [facility].   Why:  As needed regarding your rib fracture   Contact information:   [place] [address] [phone]       Signed:  08/30/2014, 9:46 AM

## 2014-08-29 NOTE — Progress Notes (Addendum)
HHPT/OT and rolling walker and tub bench all arranged with [facility] per pt choice and insurance network options. Address and phone number confirmed as correct.   Medicare IM (Important Message) delivered to patient today by me in anticipation of discharge.   Sandi Mariscal, RN BSN [facility] CCM  Case Manager, Trauma Service/Unit [facility] [phone]

## 2014-08-29 NOTE — Progress Notes (Signed)
Subjective: Patient reports "I feel ok. Yes, he said I didn't need shoulder surgery"  Objective: Vital signs in last 24 hours: Temp:  [97.5 F (36.4 C)-98.6 F (37 C)] 98.6 F (37 C) (12/11 0557) Pulse Rate:  [88-105] 103 (12/11 0557) Resp:  [16-18] 16 (12/11 0557) BP: (113-150)/(74-81) 129/76 mmHg (12/11 0557) SpO2:  [92 %-96 %] 92 % (12/11 0557)  Intake/Output from previous day: 12/10 0701 - 12/11 0700 In: 240 [P.O.:240] Out: -  Intake/Output this shift:    Alert, sitting at EOB finishing breakfast. Slightly improved shoulder ROM today bilat.  Similar RUE strength today. Incision with Dermabond. No erythema, swelling, or drainage. Pt verbalizes understanding of need to wear collar 24/7, removing while in bed for pad changes after baths.  Lab Results: No results for input(s): WBC, HGB, HCT, PLT in the last 72 hours. BMET No results for input(s): NA, K, CL, CO2, GLUCOSE, BUN, CREATININE, CALCIUM in the last 72 hours.  Studies/Results: Dg Cervical Spine 1 View  08/27/2014   CLINICAL DATA:  C6-7 ACDF.  EXAM: DG CERVICAL SPINE - 1 VIEW  COMPARISON:  MRI from 08/24/2014.  FINDINGS: Single cross-table lateral view cervical spine obtained portably at 1620 hrs is labeled #1. Endotracheal tube is noted.  Anterior spinal hardware is seen at C6-7, but is poorly visualized secondary to superimposition of the shoulders. A surgical sponges noted in the operative bed.  IMPRESSION: Intraoperative localization.   Electronically Signed   By: Misty Stanley M.D.   On: 08/27/2014 17:23   Mr Shoulder Left Wo Contrast  08/28/2014   CLINICAL DATA:  Bilateral shoulder pain, worse on the left, since falling from a ladder 5 days ago. Initial encounter.  EXAM: MRI OF THE LEFT SHOULDER WITHOUT CONTRAST  TECHNIQUE: Multiplanar, multisequence MR imaging of the shoulder was performed. No intravenous contrast was administered.  COMPARISON:  Radiographs 04/12/2014 and 08/25/2014. Chest CT 08/23/2014.  FINDINGS:  Rotator cuff: There is supraspinatus tendinosis with mild bursal surface fraying or contusion, best seen on the coronal images. No focal tear or retraction demonstrated. The infraspinous, subscapularis and teres minor tendons appear normal.  Muscles: No focal muscular atrophy or edema. There is edema tract between the subscapularis muscle and the scapular body.  Biceps long head:  Intact and normally positioned.  Acromioclavicular Joint: The acromion is type 1. There are mild acromioclavicular degenerative changes. There is a small amount of fluid in the subacromial -subdeltoid bursa. There is prominent subcutaneous edema superior to the acromioclavicular joint.  Glenohumeral Joint: Small shoulder joint effusion with mildly complex fluid in the superior subscapularis recess. No significant glenohumeral degenerative changes for age.  Labrum: Superior labral degeneration without well-defined tear or paralabral cyst.  Bones: No significant extra-articular osseous findings. The visualized left clavicle and scapula appear intact.  IMPRESSION: 1. Superior subcutaneous edema, likely related to direct impact. No focal fluid collection or fracture demonstrated. 2. Superior labral degeneration without well-defined tear or paralabral cyst. 3. Supraspinatus tendinosis and bursal surface fraying/contusion. No evidence of full-thickness rotator cuff tear.   Electronically Signed   By: Camie Patience M.D.   On: 08/28/2014 14:24    Assessment/Plan: Improving   LOS: 6 days  Pt verbalizes understanding of precations for neck/collar. Will call office to schedukle 3-4 week f/u with Drstern in office.    Verdis Prime 08/29/2014, 8:54 AM

## 2014-08-29 NOTE — Progress Notes (Signed)
Pt c/o sore throat, lozenges and chloro septic spray given without relief, pt reports that discomfort has increased since this am.  Message left with Dr. [name] in [place].

## 2014-08-29 NOTE — Plan of Care (Signed)
Problem: Phase II Progression Outcomes Goal: Progress activity as tolerated unless otherwise ordered Outcome: Progressing     

## 2014-08-29 NOTE — Progress Notes (Signed)
Physical Therapy Treatment Patient Details Name: Jared [name] MRN: [id number] DOB: Feb 05, 1951 Today's Date: 08/29/2014    History of Present Illness Adm 08/23/14 s/p fall from ladder 15 ft; +LOC, +Rt 5th rib fx; + C6-7 subluxation related to posterior element injury, Fractured RIGHT C6-7 facet. Posterolateral disc protrusion/extrusion at C6-7, narrowing both neural foramina compressing both C7 nerve roots. 12/09 removal C5-6 hardware with C6-7 ACDF. 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 Educated on safety and stair training. Patient can stay in guest room to avoid steps to master and reviewed that this would be the safest option for him. Patient is planning to DC home tomorrow with assistance from his wife  Follow Up Recommendations  Home health PT     Equipment Recommendations  Rolling walker with 5" wheels    Recommendations for Other Services       Precautions / Restrictions Precautions Precautions: Cervical Precaution Comments: educated on precautions  Required Braces or Orthoses: Cervical Brace Cervical Brace: Hard collar;At all times    Mobility  Bed Mobility Overal bed mobility: Modified Independent             General bed mobility comments: discussed home setup and HOB elevation  Transfers Overall transfer level: Modified independent                  Ambulation/Gait Ambulation/Gait assistance: Supervision Ambulation Distance (Feet): 1000 Feet Assistive device: Rolling walker (2 wheeled);None Gait Pattern/deviations: Step-through pattern;Decreased stride length Gait velocity: decr;guarded   General Gait Details: Patient able to ambulate with and without RW. Patient just using RW for general safety and did well (no LOB) without RW. Continue to recommend for longer distance in case of faitgue   Stairs    Stairs assistance: Min guard Stair Management: One rail Left;Step to pattern;Forwards Number of Stairs: 10 General stair comments: Patient completed 5 steps with rails and 5 steps without rails. Cues for technique  Wheelchair Mobility    Modified Rankin (Stroke Patients Only)       Balance                                    Cognition Arousal/Alertness: Awake/alert Behavior During Therapy: WFL for tasks assessed/performed Overall Cognitive Status: Impaired/Different from baseline Area of Impairment: Memory;Problem solving     Memory: Decreased short-term memory       Problem Solving: Slow processing      Exercises Other Exercises Other Exercises: educated on UE supported sitting in chair to decr strain on cervical area    General Comments        Pertinent Vitals/Pain Pain Assessment: No/denies pain Pain Score: 4  Pain Location: neck Pain Descriptors / Indicators: Sore Pain Intervention(s): Monitored during session    Home Living                      Prior Function            PT Goals (current goals can now be found in the care plan section) Acute Rehab PT Goals Patient Stated Goal: to walk without RW Progress towards PT goals: Progressing toward goals    Frequency  Min 5X/week    PT Plan Current plan remains appropriate    Co-evaluation  End of Session Equipment Utilized During Treatment: Cervical collar;Gait belt Activity Tolerance: Patient tolerated treatment well Patient left: in chair;with call bell/phone within reach;with chair alarm set;with family/visitor present     Time: 1410-1434 PT Time Calculation (min) (ACUTE ONLY): 24 min  Charges:  $Gait Training: 8-22 mins $Therapeutic Activity: 8-22 mins                    G Codes:      Jacqualyn Posey 08/29/2014, 2:50 PM 08/29/2014 Jacqualyn Posey PTA [phone] pager [phone] office

## 2014-08-29 NOTE — Progress Notes (Signed)
Patient ID: Jared [name], male   DOB: 04/16/1951, 63 y.o.   MRN: [id number]   LOS: 6 days   Subjective: Feeling good though a bit constipated   Objective: Vital signs in last 24 hours: Temp:  [97.5 F (36.4 C)-98.6 F (37 C)] 98.4 F (36.9 C) (12/11 0924) Pulse Rate:  [88-105] 94 (12/11 0924) Resp:  [16-18] 18 (12/11 0924) BP: (101-150)/(45-81) 101/45 mmHg (12/11 0924) SpO2:  [92 %-96 %] 95 % (12/11 0924) Last BM Date: 08/26/14   Physical Exam General appearance: alert and no distress Resp: clear to auscultation bilaterally Cardio: regular rate and rhythm GI: normal findings: bowel sounds normal and soft, non-tender   Assessment/Plan: Fall off roof  Concussion - TBI teams Right C7 superior facet fracture with arm weakness/radiculopathy - S/A ant cervical decompression C6-7 by Dr. [name] Previous C5-6 fusion Right 5th rib fracture - pulm toilet New left shoulder pain - appreciate Dr. Veverly Fells' eval Chronic right shoulder pain Abrasions to scalp and left ear - bacitracin CAD Hypothyroidism - synthroid Depression - abilify, cymbalta VTE - SCD's FEN - Mag citrate, orals for pain Dispo -- Likely home tomorrow if pain controlled with orals    Lisette Abu, PA-C Pager: [phone] General Trauma [place] Pager: [phone]  08/29/2014

## 2014-08-29 NOTE — Plan of Care (Signed)
Problem: Phase I Progression Outcomes Goal: Pain controlled with appropriate interventions Outcome: Progressing Goal: Initial discharge plan identified Outcome: Progressing Goal: Other Phase I Outcomes/Goals Outcome: Not Applicable Date Met:  46/80/32  Problem: Phase II Progression Outcomes Goal: Progress activity as tolerated unless otherwise ordered Outcome: Not Progressing

## 2014-08-30 MED ORDER — DIAZEPAM 2 MG PO TABS: 2.0000 mg | ORAL_TABLET | Freq: Three times a day (TID) | ORAL | Status: DC | PRN

## 2014-08-30 MED ORDER — BACITRACIN ZINC 500 UNIT/GM EX OINT: TOPICAL_OINTMENT | Freq: Two times a day (BID) | CUTANEOUS | Status: DC

## 2014-08-30 MED ORDER — SENNA 8.6 MG PO TABS: 1.0000 | ORAL_TABLET | Freq: Two times a day (BID) | ORAL | Status: DC

## 2014-08-30 MED ORDER — OXYCODONE HCL 5 MG PO TABS: 5.0000 mg | ORAL_TABLET | ORAL | Status: DC | PRN

## 2014-08-30 MED ORDER — DSS 100 MG PO CAPS: 100.0000 mg | ORAL_CAPSULE | Freq: Two times a day (BID) | ORAL | Status: DC

## 2014-08-30 MED ORDER — POLYETHYLENE GLYCOL 3350 17 G PO PACK: 17.0000 g | PACK | Freq: Every day | ORAL | Status: DC | PRN

## 2014-08-30 NOTE — Progress Notes (Signed)
Pt d/c to home by car with family. Assessment stable. Prescriptions given. 

## 2014-08-30 NOTE — Discharge Instructions (Signed)
Rib Fracture A rib fracture is a break or crack in one of the bones of the ribs. The ribs are a group of long, curved bones that wrap around your chest and attach to your spine. They protect your lungs and other organs in the chest cavity. A broken or cracked rib is often painful, but most do not cause other problems. Most rib fractures heal on their own over time. However, rib fractures can be more serious if multiple ribs are broken or if broken ribs move out of place and push against other structures. CAUSES   A direct blow to the chest. For example, this could happen during contact sports, a car accident, or a fall against a hard object.  Repetitive movements with high force, such as pitching a baseball or having severe coughing spells. SYMPTOMS   Pain when you breathe in or cough.  Pain when someone presses on the injured area. DIAGNOSIS  Your caregiver will perform a physical exam. Various imaging tests may be ordered to confirm the diagnosis and to look for related injuries. These tests may include a chest X-ray, computed tomography (CT), magnetic resonance imaging (MRI), or a bone scan. TREATMENT  Rib fractures usually heal on their own in 1-3 months. The longer healing period is often associated with a continued cough or other aggravating activities. During the healing period, pain control is very important. Medication is usually given to control pain. Hospitalization or surgery may be needed for more severe injuries, such as those in which multiple ribs are broken or the ribs have moved out of place.  HOME CARE INSTRUCTIONS   Avoid strenuous activity and any activities or movements that cause pain. Be careful during activities and avoid bumping the injured rib.  Gradually increase activity as directed by your caregiver.  Only take over-the-counter or prescription medications as directed by your caregiver. Do not take other medications without asking your caregiver first.  Apply ice  to the injured area for the first 1-2 days after you have been treated or as directed by your caregiver. Applying ice helps to reduce inflammation and pain.  Put ice in a plastic bag.  Place a towel between your skin and the bag.   Leave the ice on for 15-20 minutes at a time, every 2 hours while you are awake.  Perform deep breathing as directed by your caregiver. This will help prevent pneumonia, which is a common complication of a broken rib. Your caregiver may instruct you to:  Take deep breaths several times a day.  Try to cough several times a day, holding a pillow against the injured area.  Use a device called an incentive spirometer to practice deep breathing several times a day.  Drink enough fluids to keep your urine clear or pale yellow. This will help you avoid constipation.   Do not wear a rib belt or binder. These restrict breathing, which can lead to pneumonia.  SEEK IMMEDIATE MEDICAL CARE IF:   You have a fever.   You have difficulty breathing or shortness of breath.   You develop a continual cough, or you cough up thick or bloody sputum.  You feel sick to your stomach (nausea), throw up (vomit), or have abdominal pain.   You have worsening pain not controlled with medications.  MAKE SURE YOU:  Understand these instructions.  Will watch your condition.  Will get help right away if you are not doing well or get worse. Document Released: 09/05/2005 Document Revised: 05/08/2013 Document Reviewed:  11/07/2012 [employer] Patient Information 2015 [place], [place]. This information is not intended to replace advice given to you by your health care provider. Make sure you discuss any questions you have with your health care provider. Anterior Cervical Fusion, Care After Refer to this sheet in the next few weeks. These instructions provide you with information on caring for yourself after your procedure. Your health care provider may also give you more specific  instructions. Your treatment has been planned according to current medical practices, but problems sometimes occur. Call your health care provider if you have any problems or questions after your procedure. WHAT TO EXPECT AFTER THE PROCEDURE After your procedure, it is typical to have the following:  Pain. Numbness. Weakness. HOME CARE INSTRUCTIONS  It can take 6-12 months to recover fully from this procedure. Make sure to follow all of your health care provider's instructions carefully.  Only take medicines as directed by your health care provider. You can eat what you usually do. Your activities will be limited for the first 3-4 months. In general: It is fine to walk and climb stairs. Do not lift anything weighing more than 10 lb (4.5 kg). Do not lift objects over your head. Do not drive until your health care provider says it is okay. Take showers instead of baths until directed by your health care provider. If you have to wear a cervical collar, take it off only as directed by your health care provider. You may be able to take it off to eat and shower. Change the bandage as directed by your health care provider. Return to your health care provider to have sutures or staples removed as directed by your health care provider. Follow your health care provider's instructions for physical therapy. You may apply ice to the repaired area:  Put ice in a plastic bag. Place a towel between your skin and the bag. Leave the ice on for 20 minutes, 2-3 times a day.  Keep all follow-up appointments. SEEK MEDICAL CARE IF: You have redness, swelling, or pain in your cut (incision) that is getting worse. You have pus coming from the incision. You have a fever. There is a bad smell coming from the incision or bandage. The edges of the incision break open after sutures or staples are taken out. Your pain medicine is not helping. You have swelling in your calf or leg. You seem to be getting worse  instead of better.  SEEK IMMEDIATE MEDICAL CARE IF: You develop shortness of breath or chest pain. You have trouble swallowing. You have pain, numbness, or weakness that is getting worse. Document Released: 04/19/2004 Document Revised: 09/10/2013 Document Reviewed: 07/02/2013 [facility] Patient Information 2015 [place], [place]. This information is not intended to replace advice given to you by your health care provider. Make sure you discuss any questions you have with your health care provider. Anterior Cervical Diskectomy and Fusion Anterior cervical diskectomy is surgery done on the upper spine to relieve pressure on one or more nerve roots, or on the spinal cord. There are 7 bones in your neck, called the cervical spine. These 7 bones (vertebrae) sit one on top of the other. Cushions (intervertebral disks) separate the vertebrae and act like shock absorbers. As we age, degeneration of our bones, joints, and disks can cause neck pain and tightening around the spinal cord and nerve roots. This causes arm pain and weakness.  Degeneration involves:  Herniated Disk. With age, the disks dry up and can rupture. In this condition, the  center of the disk bulges out (disk herniation). This can cause pressure on a nerve, which produces pain or weakness in the arm.  Bone spurs and spinal stenosis. As we age, growths often develop on our bones. These growths are called bone spurs (osteophytes). A bone spur is a collection of calcium. As bone spurs grow and extend, the vertebral openings become narrow. The spinal canal and/or the foramen (opening for nerve passageways) become smaller. This narrowing (stenosis) may cause pinching (compression) of the spinal cord or the spinal nerve root. The nerve injury can cause pain, weakness, numbness, and loss of coordination in the upper limbs. Often, patients have difficulty with their hand writing or they start dropping things, because their hand grip is weaker. The spinal  cord damage can cause increased stiffness, more frequent falls, electric shooting pain, and changes in bowel and bladder control. Degeneration in the neck results in three common problems:  Radiculopathy - Nerve compression that results in weakness or pain that radiates down the arm.  Myelopathy - Spinal cord compression that causes stiffness, difficulty with walking, coordination, and trouble with bowel or bladder habits.  Neck pain - Worn out joints cause pain as the neck moves. Treatment:  Radiculopathy - Surgery is performed to remove the bony and disk material that is pushing on the nerve.  Myelopathy - Surgery is performed to remove the bony and disk material that pushes on the spinal cord.  Neck pain - Surgery is performed to combine (fuse) the joints of the neck together, so they cannot move or cause pain. Surgery can be done from the front or the back of the neck. When it is done from the front, it is called an anterior (front) cervical (neck) diskectomy (removal of the disk) and fusion. LET YOUR CAREGIVER KNOW ABOUT:   Recent infections.  Any shooting pains down your leg, when you move your neck.  Any difficulty swallowing.  A smoking history.  Use of blood thinners or anti-inflammatory medicines.  Any history of injury to your shoulders.  Any history of injury to your vocal cords.  Any foreign objects in your body from a previous surgery.  Any recent fevers or illness.  Past medical history (diabetes, strokes).  Past problems with anesthetics.  Possibility of pregnancy.  History of blood clots (deep vein thrombosis).  History of bleeding or blood problems.  Past surgeries.  Other health problems.  Allergies.  Medicines you take, including herbs, eye drops, over-the-counter medicines, and creams.  Use of steroids (by mouth or creams). RISKS AND COMPLICATIONS  Infection.  Bleeding.  Injury to the following structures:  Carotid artery. This can  result in a stroke or significant amount of bleeding.  Esophagus, resulting in difficulty swallowing.  Recurrent laryngeal nerve, resulting in hoarseness of the voice.  Spinal cord injury, ranging from mild to complete quadriparesis (muscle weakness in all four limbs).  Nerve root injury, resulting in muscle weakness in the upper limb.  Leakage of cerebrospinal fluid. BEFORE THE PROCEDURE   You will be given medicine to help you sleep (general anesthetic), and a breathing tube will be placed.  You will be given antibiotics to keep the infection rate down.  The incision site on your neck will be marked.  Your neck will be cleaned, to reduce the risk of infection. PROCEDURE  An anterior cervical fusion means that the operation is done through the front (anterior) part of your neck. The cut made by the surgeon (incision) is usually within a skin  fold line on the neck. After pushing aside the neck muscles, the surgeon removes the affected, degenerated disk and bone spurs (osteophytes), which takes the pressure off the nerves and spinal cord. This is called a decompression. The area where the disk was removed is then filled with a small piece of plastic. This plastic takes the place of the disk and keeps the nerve passageway (foramen) open and clear for the nerves. In most cases, the surgeon uses metal plates or pins (hardware) in the neck, to help stabilize the level being fused. The hardware reduces motion at that level, so it can fuse. This provides extra support to the neck. A cervical fusion procedure takes anywhere from a couple to several hours, depending on the size of the neck, history of previous surgery, and number of levels being fused. AFTER THE PROCEDURE   You will likely spend 24-48 hours in the hospital. During this time, your caregivers will look for any signs of complications from the procedure.  Your caregiver will watch you, to make sure that fluid draining from the surgery  slows down. It is important that a large mass of blood does not form in your neck, which would cause difficulty with breathing.  You will get 24 hours of antibiotics.  You can start to eat as soon as you feel comfortable.  Once you have started eating, walking, urinating (voiding) and having bowel movements on your own, your caregiver will discharge you home. HOME CARE INSTRUCTIONS   For 2 weeks, do not soak the incision site under water. Do not swim or take baths. Showers are okay, but rinse off the incision sites.  Do not over exert yourself. Allow time for the incision to heal.  It can take from 6 weeks to 6 months for fusion to take effect. Your caregiver may ask you to wear a neck collar during this time, as they check the fusion with multiple (serial) X-rays. Document Released: 08/24/2009 Document Revised: 12/31/2012 Document Reviewed: 08/24/2009 [facility] Patient Information 2015 [place], [place]. This information is not intended to replace advice given to you by your health care provider. Make sure you discuss any questions you have with your health care provider.

## 2014-08-30 NOTE — Progress Notes (Signed)
No issues overnight. Pt tolerating diet with minimal dysphagia. Minimal neck pain.  EXAM:  BP 132/86 mmHg  Pulse 73  Temp(Src) 97.7 F (36.5 C) (Oral)  Resp 16  Ht 5' 7.5" (1.715 m)  Wt 83.915 kg (185 lb)  BMI 28.53 kg/m2  SpO2 95%  Awake, alert, oriented  Speech fluent, appropriate  CN grossly intact  5/5 BUE/BLE x 4+/5 right tricep  IMPRESSION:  63 y.o. male s/p ACDF, doing well  PLAN: - Stable for d/c from neurosurgical standpoint - Can f/u with Dr. [name] in 2-3 weeks

## 2014-08-30 NOTE — Care Management Note (Addendum)
    Page 1 of 2   08/30/2014     10:08:31 AM CARE MANAGEMENT NOTE 08/30/2014  Patient:  Jared [name], Jared [name]   Account Number:  [id number]  Date Initiated:  08/27/2014  Documentation initiated by:  Sandi Mariscal  Subjective/Objective Assessment:   fell 15 feet from ladder while cleaning gutters--Cspine fx     Action/Plan:   await PT/OT evals after ACDF on 12/9   Anticipated DC Date:  08/30/2014   Anticipated DC Plan:  [place]  CM consult      [facility] Choice  HOME HEALTH   Choice offered to / List presented to:  C-1 Patient   DME arranged  [place]      DME agency  [place] arranged  [place].   Status of service:  Completed, signed off Medicare Important Message given?  YES (If response is "NO", the following Medicare IM given date fields will be blank) Date Medicare IM given:  08/29/2014 Medicare IM given by:  Sandi Mariscal Date Additional Medicare IM given:   Additional Medicare IM given by:    Discharge Disposition:  [place]  Per UR Regulation:  Reviewed for med. necessity/level of care/duration of stay  If discussed at [place] of Stay Meetings, dates discussed:    Comments:  12.12.15 9AM    CM notified [facility] rep Stephenie of discharge; CM called DME rep to deliver 3 in 1 rollling walker and tub bench to   Patients room prior to patient discharge. Jamse Arn RN BSN [phone].

## 2014-09-01 ENCOUNTER — Encounter (HOSPITAL_COMMUNITY): Payer: Self-pay | Admitting: Neurosurgery

## 2014-09-11 DIAGNOSIS — M5412 Radiculopathy, cervical region: Secondary | ICD-10-CM | POA: Insufficient documentation

## 2014-10-13 DIAGNOSIS — M5412 Radiculopathy, cervical region: Secondary | ICD-10-CM | POA: Insufficient documentation

## 2014-10-29 ENCOUNTER — Ambulatory Visit: Payer: Medicare Other | Admitting: Neurology

## 2015-04-01 ENCOUNTER — Telehealth: Payer: Self-pay | Admitting: Neurology

## 2015-04-01 NOTE — Telephone Encounter (Signed)
patient is needing the rx that was faxed over from [facility] signed and faxed back so that they can order the supplies for his cpap. you can reach pt at the number listed above.

## 2015-04-01 NOTE — Telephone Encounter (Signed)
Informed pt that the RX was sent to [facility] today. Pt verbalized understanding.

## 2015-04-09 ENCOUNTER — Encounter: Payer: Self-pay | Admitting: Internal Medicine

## 2015-04-23 ENCOUNTER — Ambulatory Visit: Payer: Medicare Other | Admitting: Adult Health

## 2015-05-21 DIAGNOSIS — H1010 Acute atopic conjunctivitis, unspecified eye: Secondary | ICD-10-CM | POA: Insufficient documentation

## 2015-05-21 DIAGNOSIS — J309 Allergic rhinitis, unspecified: Principal | ICD-10-CM

## 2015-05-29 ENCOUNTER — Other Ambulatory Visit: Payer: Self-pay

## 2015-05-29 MED ORDER — OMALIZUMAB 150 MG ~~LOC~~ SOLR
150.0000 mg | SUBCUTANEOUS | Status: DC
  Administered 2015-07-10 – 2018-04-04 (×29): 150 mg via SUBCUTANEOUS

## 2015-06-26 ENCOUNTER — Other Ambulatory Visit: Payer: Self-pay | Admitting: *Deleted

## 2015-06-26 MED ORDER — OMALIZUMAB 150 MG ~~LOC~~ SOLR: 150.0000 mg | SUBCUTANEOUS | Status: DC

## 2015-07-10 ENCOUNTER — Ambulatory Visit (INDEPENDENT_AMBULATORY_CARE_PROVIDER_SITE_OTHER): Payer: Medicare Other

## 2015-07-10 ENCOUNTER — Ambulatory Visit: Payer: Self-pay

## 2015-07-10 ENCOUNTER — Ambulatory Visit: Payer: Medicare Other

## 2015-07-10 DIAGNOSIS — J4540 Moderate persistent asthma, uncomplicated: Secondary | ICD-10-CM | POA: Diagnosis not present

## 2015-07-10 DIAGNOSIS — J301 Allergic rhinitis due to pollen: Secondary | ICD-10-CM

## 2015-07-10 DIAGNOSIS — J4541 Moderate persistent asthma with (acute) exacerbation: Secondary | ICD-10-CM

## 2015-07-10 DIAGNOSIS — J45901 Unspecified asthma with (acute) exacerbation: Secondary | ICD-10-CM

## 2015-07-21 ENCOUNTER — Ambulatory Visit (INDEPENDENT_AMBULATORY_CARE_PROVIDER_SITE_OTHER): Payer: Medicare Other | Admitting: Cardiovascular Disease

## 2015-07-21 ENCOUNTER — Encounter: Payer: Self-pay | Admitting: Cardiovascular Disease

## 2015-07-21 VITALS — BP 138/90 | HR 70 | Ht 68.0 in | Wt 190.0 lb

## 2015-07-21 DIAGNOSIS — I10 Essential (primary) hypertension: Secondary | ICD-10-CM

## 2015-07-21 DIAGNOSIS — I2510 Atherosclerotic heart disease of native coronary artery without angina pectoris: Secondary | ICD-10-CM | POA: Diagnosis not present

## 2015-07-21 MED ORDER — NITROGLYCERIN 0.4 MG SL SUBL: 0.4000 mg | SUBLINGUAL_TABLET | SUBLINGUAL | Status: DC | PRN

## 2015-07-21 MED ORDER — ISOSORBIDE MONONITRATE ER 30 MG PO TB24: 30.0000 mg | ORAL_TABLET | Freq: Every day | ORAL | Status: DC

## 2015-07-21 MED ORDER — EZETIMIBE 10 MG PO TABS: 10.0000 mg | ORAL_TABLET | Freq: Every day | ORAL | Status: DC

## 2015-07-21 NOTE — Assessment & Plan Note (Signed)
History of CAD status post dominant RCA stenting in 2000. His last catheterization was in 2011 which time he had a patent RCA stent with 30% left main stenosis and normal LV function. He had a negative Myoview stress test 06/26/13 and denies chest pain or shortness of breath.

## 2015-07-21 NOTE — Assessment & Plan Note (Signed)
History of hypertension with blood pressure measured at 130/90 although he says this is relatively high for him. He is not on antihypertensive medications.

## 2015-07-21 NOTE — Assessment & Plan Note (Signed)
History of dyslipidemia on Zetia followed by his PCP

## 2015-07-21 NOTE — Patient Instructions (Signed)
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
## 2015-07-27 ENCOUNTER — Encounter: Payer: Self-pay | Admitting: Cardiovascular Disease

## 2015-08-19 ENCOUNTER — Ambulatory Visit (INDEPENDENT_AMBULATORY_CARE_PROVIDER_SITE_OTHER): Payer: Medicare Other

## 2015-08-19 DIAGNOSIS — J4540 Moderate persistent asthma, uncomplicated: Secondary | ICD-10-CM | POA: Diagnosis not present

## 2015-08-21 ENCOUNTER — Ambulatory Visit (INDEPENDENT_AMBULATORY_CARE_PROVIDER_SITE_OTHER): Payer: Medicare Other | Admitting: Internal Medicine

## 2015-08-21 ENCOUNTER — Encounter: Payer: Self-pay | Admitting: Internal Medicine

## 2015-08-21 VITALS — BP 126/80 | HR 76 | Temp 97.8°F | Resp 20 | Ht 68.0 in | Wt 192.6 lb

## 2015-08-21 DIAGNOSIS — J4541 Moderate persistent asthma with (acute) exacerbation: Secondary | ICD-10-CM | POA: Diagnosis not present

## 2015-08-21 MED ORDER — METHYLPREDNISOLONE ACETATE 80 MG/ML IJ SUSP
80.0000 mg | Freq: Once | INTRAMUSCULAR | Status: AC
  Administered 2015-08-21: 80 mg via INTRAMUSCULAR

## 2015-08-21 MED ORDER — ALBUTEROL SULFATE (2.5 MG/3ML) 0.083% IN NEBU
2.5000 mg | INHALATION_SOLUTION | Freq: Once | RESPIRATORY_TRACT | Status: AC
Start: 2015-08-21 — End: 2015-08-21
  Administered 2015-08-21: 2.5 mg via RESPIRATORY_TRACT

## 2015-08-21 NOTE — Assessment & Plan Note (Addendum)
   Persistent, currently not well controlled due to post infectious inflammation and medication noncompliance  Given a Depo-Medrol injection of 80 mg IM. Given an albuterol nebulization in the office with improvement in his symptoms.  For the next 2 days, he will use albuterol every 4-6 hours around-the-clock; after that, he may use it as needed.  Continue Xolair, Dulera 200 mg 2 puffs twice a day-medication compliance was stressed. Patient will have his wife help him remember to take his medications.

## 2015-08-21 NOTE — Progress Notes (Signed)
08/21/2015  Jared [name] 1951-03-24 [id number]  Referring provider: Burnard Bunting, MD [address] [place], [place] [address]  Chief Complaint: Asthma   Jared [name] is a 64 y.o. male who is being seen today for a sick visit.   HPI Comments: Asthma on Xolair: Patient was seen in September for an asthma exacerbation and was treated with a steroid bursts. He did well until 2-3 weeks ago when he developed upper respiratory infection. His infection has resolved, but he continues to have persistent wheezing requiring him to use albuterol 4-5 times last night. In the past he was noted to be noncompliant with his Ruthe Mannan and today he reports stopping his Dulera until a few days ago when he got sick.  Allergic rhinitis: Symptoms were stable until recently as above.    ROS: Per HPI unless specifically indicated below Review of Systems   Drug Allergies:  Allergies  Allergen Reactions  . 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 Penicillins Hives and Itching  . Sulfonamide Derivatives Hives and Itching  . Codeine Nausea Only  . Statins Other (See Comments)    Myalgias    Medications:  Current outpatient prescriptions:  .  albuterol (PROAIR HFA) 108 (90 BASE) MCG/ACT inhaler, Inhale 2 puffs into the lungs every 4 (four) hours as needed for wheezing or shortness of breath., Disp: , Rfl:  .  albuterol (PROVENTIL) (2.5 MG/3ML) 0.083% nebulizer solution, Take 2.5 mg by nebulization every 4 (four) hours as needed for wheezing or shortness of breath., Disp: , Rfl:  .  ARIPiprazole (ABILIFY) 5 MG tablet, Take 20 mg by mouth daily. , Disp: , Rfl:  .  aspirin 325 MG tablet, Take 325 mg by mouth daily., Disp: , Rfl:  .  DULoxetine (CYMBALTA) 60 MG capsule, Take 60 mg by mouth daily., Disp: , Rfl:  .  ezetimibe (ZETIA) 10 MG  tablet, Take 1 tablet (10 mg total) by mouth daily., Disp: 90 tablet, Rfl: 3 .  finasteride (PROSCAR) 5 MG tablet, Take 5 mg by mouth at bedtime. , Disp: , Rfl:  .  fish oil-omega-3 fatty acids 1000 MG capsule, Take 1 g by mouth 2 (two) times daily. , Disp: , Rfl:  .  gemfibrozil (LOPID) 600 MG tablet, Take 600 mg by mouth 2 (two) times daily., Disp: , Rfl:  .  isosorbide mononitrate (IMDUR) 30 MG 24 hr tablet, Take 1 tablet (30 mg total) by mouth daily., Disp: 90 tablet, Rfl: 3 .  levothyroxine (SYNTHROID, LEVOTHROID) 25 MCG tablet, Take 25 mcg by mouth daily before breakfast. , Disp: , Rfl:  .  mometasone-formoterol (DULERA) 200-5 MCG/ACT AERO, Inhale 2 puffs into the lungs 2 (two) times daily., Disp: , Rfl:  .  Multiple Vitamin (MULTIVITAMIN) tablet, Take 1 tablet by mouth daily., Disp: , Rfl:  .  nitroGLYCERIN (NITROSTAT) 0.4 MG SL tablet, Place 1 tablet (0.4 mg total) under the tongue every 5 (five) minutes as needed for chest pain., Disp: 25 tablet, Rfl: 3 .  omalizumab (XOLAIR) 150 MG injection, Inject 150 mg into the skin every 28 (twenty-eight) days., Disp: 1 each, Rfl: 11 .  silodosin (RAPAFLO) 8 MG CAPS capsule, Take 8 mg by mouth at bedtime. , Disp: , Rfl:  .  vitamin C (ASCORBIC ACID) 500 MG tablet,  Take 500 mg by mouth 2 (two) times daily. , Disp: , Rfl:   Current facility-administered medications:  .  omalizumab [name]) injection 150 mg, 150 mg, Subcutaneous, Q28 days, Jiles Prows, MD, 150 mg at 08/19/15 1557  Physical Exam: BP 126/80 mmHg  Pulse 76  Temp(Src) 97.8 F (36.6 C) (Oral)  Resp 20  Ht 5\' 8"  (1.727 m)  Wt 192 lb 9.6 oz (87.363 kg)  BMI 29.29 kg/m2  Physical Exam  Constitutional: He appears well-developed.  HENT:  Nose: Nose normal.  Mouth/Throat: Oropharynx is clear and moist.  Eyes: Conjunctivae are normal.  Cardiovascular: Normal rate, regular rhythm and normal heart sounds.   No murmur heard. Pulmonary/Chest: Effort normal. No respiratory distress. He  has wheezes.  Abdominal: Soft. Bowel sounds are normal.  Musculoskeletal: He exhibits no edema.  Lymphadenopathy:    He has no cervical adenopathy.  Neurological: He is alert.  Skin: No rash noted.  Psychiatric:  Flat affect  Vitals reviewed.   Diagnostics:   Spirometry: FEV1 58 %, FEV1/FVC  84% Not reversible following bronchodilators Spirometry is in the abnormal range - Moderate restriction   Assessment and Plan:  Moderate persistent asthma  Persistent, currently not well controlled due to post infectious inflammation and medication noncompliance  Given a Depo-Medrol injection of 80 mg IM. Given an albuterol nebulization in the office with improvement in his symptoms.  For the next 2 days, he will use albuterol every 4-6 hours around-the-clock; after that, he may use it as needed.  Continue Xolair, Dulera 200 mg 2 puffs twice a day-medication compliance was stressed. Patient will have his wife help him remember to take his medications.    Return in about 4 weeks (around 09/18/2015).  Thank you for the opportunity to care for this patient.  Please do not hesitate to contact me with questions.  [facility] [address] [place], [place] [address] [phone]  Asthma on Xolair

## 2015-08-21 NOTE — Patient Instructions (Signed)
Moderate persistent asthma  Persistent, currently not well controlled due to post infectious inflammation and medication noncompliance  Given a Depo-Medrol injection of 80 mg IM. Given an albuterol nebulization in the office with improvement in his symptoms.  For the next 2 days, he will use albuterol every 4-6 hours around-the-clock;mafter that, he may use it as needed.  Continue Xolair, Dulera 200 mg 2 puffs twice a day-medication compliance was stressed. Patient will have his wife help him remember to take his medications.

## 2015-09-29 ENCOUNTER — Ambulatory Visit (INDEPENDENT_AMBULATORY_CARE_PROVIDER_SITE_OTHER): Payer: Medicare Other | Admitting: *Deleted

## 2015-09-29 DIAGNOSIS — J4540 Moderate persistent asthma, uncomplicated: Secondary | ICD-10-CM

## 2015-10-27 ENCOUNTER — Ambulatory Visit (INDEPENDENT_AMBULATORY_CARE_PROVIDER_SITE_OTHER): Payer: Medicare Other

## 2015-10-27 ENCOUNTER — Other Ambulatory Visit: Payer: Self-pay

## 2015-10-27 DIAGNOSIS — J4540 Moderate persistent asthma, uncomplicated: Secondary | ICD-10-CM | POA: Diagnosis not present

## 2015-10-27 MED ORDER — AEROCHAMBER PLUS MISC: Status: DC

## 2015-11-24 ENCOUNTER — Ambulatory Visit (INDEPENDENT_AMBULATORY_CARE_PROVIDER_SITE_OTHER): Payer: Medicare Other

## 2015-11-24 DIAGNOSIS — J4540 Moderate persistent asthma, uncomplicated: Secondary | ICD-10-CM | POA: Diagnosis not present

## 2015-12-24 ENCOUNTER — Encounter: Payer: Self-pay | Admitting: Allergy and Immunology

## 2015-12-24 ENCOUNTER — Ambulatory Visit (INDEPENDENT_AMBULATORY_CARE_PROVIDER_SITE_OTHER): Payer: Medicare Other | Admitting: Allergy and Immunology

## 2015-12-24 VITALS — HR 72 | Temp 97.9°F | Resp 20

## 2015-12-24 DIAGNOSIS — H1010 Acute atopic conjunctivitis, unspecified eye: Secondary | ICD-10-CM | POA: Diagnosis not present

## 2015-12-24 DIAGNOSIS — J4551 Severe persistent asthma with (acute) exacerbation: Secondary | ICD-10-CM

## 2015-12-24 DIAGNOSIS — J309 Allergic rhinitis, unspecified: Secondary | ICD-10-CM

## 2015-12-24 NOTE — Patient Instructions (Addendum)
Begin prednisone  40mg  now and complete  Per instructions over the next 5  days.  Consistently use Dulera 234mcg 2 Puffs  twice  daily.  Saline nasal wash 2-4 times daily.    Rhinocort 1-2 sprays once daily.    Pro-air HFA 2 puffs or albuterol nebulizer every 4 hours as needed.  Call with additional questions or concerns to on call physician or go to Emergency Department.  Continue Xolair as previously.  Follow-up in 1-2 weeks or sooner if needed.

## 2015-12-24 NOTE — Progress Notes (Signed)
FOLLOW UP NOTE  RE: Jared [name]: [id number] DOB: 1951-07-19 [facility] [place] [address] [place] [address] Date of Office Visit: 12/24/2015  Subjective:  Jared [name] is a 65 y.o. male who presents today for Wheezing and Cough  Assessment:   1. Severe persistent asthma, acute exacerbation in no respiratory distress with normal oxygenation.  2. Allergic rhinoconjunctivitis   3.      Incomplete medication adherence. 4.      Complex medical history on multiple medication regime. Plan:  No orders of the defined types were placed in this encounter.   Patient Instructions  1.  Begin prednisone 40mg  now and complete Per instructions over the next 5 days (adult dose pack). 2.  Consistently use Dulera 24mcg 2 Puffs twice  daily. 3.  Saline nasal wash 2-4 times daily.   4.  Rhinocort 1-2 sprays once daily.   5.  Pro-air HFA 2 puffs or albuterol nebulizer every 4 hours as needed. 6.  Call with additional questions or concerns to on call physician or go to Emergency Department. 7.  Continue Xolair as previously. 8.  Follow-up in 1-2 weeks or sooner if needed.    HPI: Jared [name] returns to the office with cough and wheeze.  Since his last visit here in September 2016,  he describes an upper respiratory infection in March--noted congestion, sneezing, postnasal drip, without fever, sore throat, headache, or chest symptoms.  After that March illness noted intermittent cough, which seemed to persist despite the resolution of other symptoms.  Then he noted wheezing, chest congestion, shortness of breath and started using albuterol neb at least once a day.  In the last 2 weeks, increased to [facility] 2 puffs twice daily (from one puff twice daily) and added Pro Air 4 times a day.  He last received Xolair 3 weeks ago without difficulty.  He recalls,  typically this time of year with seasonal variance is a symptomatic time for him in particular  the last several days mild nasal congestion without sneezing, headache, sore throat, fever or discolored drainage.  Denies ED or urgent care visits, prednisone or antibiotic courses. Reports sleep and activity are normal.  Jared [name] has a current medication list which includes the following prescription(s): albuterol, albuterol, aripiprazole, aspirin, belsomra, duloxetine, ezetimibe, finasteride, fish oil-omega-3 fatty acids, gemfibrozil, isosorbide mononitrate, levothyroxine, mometasone-formoterol, nitroglycerin, silodosin, aerochamber plus, vitamin c, and multivitamin, and the following Facility-Administered Medications: omalizumab.   Drug Allergies: Allergies  Allergen Reactions  . 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 Penicillins Hives and Itching  . Sulfonamide Derivatives Hives and Itching  . Codeine Nausea Only  . Statins Other (See Comments)    Myalgias   Objective:   Filed Vitals:   12/24/15 1121  Pulse: 72  Temp: 97.9 F (36.6 C)  Resp: 20   SpO2 Readings from Last 1 Encounters:  12/24/15 94%   Physical Exam  Constitutional: He is well-developed, well-nourished, and in no distress.  Non-toxic appearance.  Communicating easily in full sentences.  HENT:  Head: Atraumatic.  Right Ear: Tympanic membrane and ear canal normal.  Left Ear: Tympanic membrane and ear canal normal.  Nose: Mucosal edema present. No rhinorrhea. No epistaxis.  Mouth/Throat: Oropharynx is clear and  moist and mucous membranes are normal. No oropharyngeal exudate, posterior oropharyngeal edema or posterior oropharyngeal erythema.  Eyes: Conjunctivae are normal.  Neck: Neck supple.  Cardiovascular: Normal rate, S1 normal and S2 normal.   No murmur heard. Pulmonary/Chest: Effort normal. No accessory muscle usage. No respiratory distress. He  has wheezes. He has no rhonchi. He has no rales.  Post-Xopenex/Atrovent neb: improved aeration with rare end expiratory wheeze without rhonchi or crackles.  Lymphadenopathy:    He has no cervical adenopathy.  Skin: Skin is warm and intact. No rash noted. No cyanosis. Nails show no clubbing.   Diagnostics: Spirometry:  FVC 2.58--77%, FEV1 1.96--75%; postbronchodilator improvement , FVC 2.88--86%, FEV1 2.22--85%.    Jared M. Ishmael Holter, MD  cc: Geoffery Lyons, MD

## 2015-12-25 ENCOUNTER — Ambulatory Visit (INDEPENDENT_AMBULATORY_CARE_PROVIDER_SITE_OTHER): Payer: Medicare Other | Admitting: *Deleted

## 2015-12-25 DIAGNOSIS — J4540 Moderate persistent asthma, uncomplicated: Secondary | ICD-10-CM

## 2016-01-12 ENCOUNTER — Other Ambulatory Visit: Payer: Self-pay | Admitting: *Deleted

## 2016-01-12 MED ORDER — IPRATROPIUM-ALBUTEROL 0.5-2.5 (3) MG/3ML IN SOLN: 3.0000 mL | Freq: Four times a day (QID) | RESPIRATORY_TRACT | Status: DC | PRN

## 2016-01-13 ENCOUNTER — Ambulatory Visit (INDEPENDENT_AMBULATORY_CARE_PROVIDER_SITE_OTHER): Payer: Medicare Other | Admitting: Allergy and Immunology

## 2016-01-13 VITALS — BP 140/88 | HR 88 | Resp 18

## 2016-01-13 DIAGNOSIS — J309 Allergic rhinitis, unspecified: Secondary | ICD-10-CM | POA: Diagnosis not present

## 2016-01-13 DIAGNOSIS — J4551 Severe persistent asthma with (acute) exacerbation: Secondary | ICD-10-CM

## 2016-01-13 DIAGNOSIS — H1010 Acute atopic conjunctivitis, unspecified eye: Secondary | ICD-10-CM | POA: Diagnosis not present

## 2016-01-13 DIAGNOSIS — J387 Other diseases of larynx: Secondary | ICD-10-CM

## 2016-01-13 DIAGNOSIS — K219 Gastro-esophageal reflux disease without esophagitis: Secondary | ICD-10-CM

## 2016-01-13 MED ORDER — METHYLPREDNISOLONE ACETATE 80 MG/ML IJ SUSP
80.0000 mg | Freq: Once | INTRAMUSCULAR | Status: AC
  Administered 2016-01-13: 80 mg via INTRAMUSCULAR

## 2016-01-13 MED ORDER — BECLOMETHASONE DIPROPIONATE 80 MCG/ACT IN AERS: 2.0000 | INHALATION_SPRAY | Freq: Two times a day (BID) | RESPIRATORY_TRACT | Status: DC

## 2016-01-13 MED ORDER — OMEPRAZOLE 20 MG PO CPDR: 20.0000 mg | DELAYED_RELEASE_CAPSULE | Freq: Every day | ORAL | Status: DC

## 2016-01-13 MED ORDER — MONTELUKAST SODIUM 10 MG PO TABS: 10.0000 mg | ORAL_TABLET | Freq: Every day | ORAL | Status: DC

## 2016-01-13 NOTE — Patient Instructions (Addendum)
  1. Depo-Medrol 80 IM delivered in clinic today  2. DuoNeb delivered in clinic today  3. Use a combination of the following:   A. Dulera 200 - 2 inhalations twice a day  B. Qvar 80 - 2 inhalations twice a day  C. montelukast 10 mg - one tablet one time per day  D. OTC Rhinocort - one spray each nostril once a day  E. omeprazole 20 mg - one tablet twice a day  4. Prednisone 20 mg a day for 5 days then 15 mg a day for 5 days then 10 mg a day for 5 days then 5 mg a day for 5 days  5. Continue DuoNeb or ProAir HFA if needed  6. Continue Xolair and EpiPen  7. Continue nasal saline and antihistamine if needed  8. Return to clinic in 20 days or earlier if problem

## 2016-01-13 NOTE — Progress Notes (Signed)
Follow-up Note  Referring Provider: Burnard Bunting, MD Primary Provider: Geoffery Lyons, MD Date of Office Visit: 01/13/2016  Subjective:   Jared [name] (DOB: 04-14-1951) is a 65 y.o. male who returns to the [facility] on 01/13/2016 in re-evaluation of the following:  HPI: Jared [name] returns to this clinic with significant problems involving his respiratory tract. Although he did improve with medical therapy prescribed by Dr. Ishmael Holter which included systemic steroids he quickly relapsed after discontinuing this agent and now has very significant shortness of breath, coughing, wheezing, and using his bronchodilator 4 times per day and at nighttime. As well, he's been having issues with some nasal congestion although no anosmia or ugly nasal discharge. He has not had any ugly sputum production or chest pain or fevers. He has been having problems with reflux for which she's been using over-the-counter Tums.    Medication List           AEROCHAMBER PLUS inhaler  Use as instructed     ARIPiprazole 5 MG tablet  Commonly known as:  ABILIFY  Take 20 mg by mouth daily.     aspirin 325 MG tablet  Take 325 mg by mouth daily.     BELSOMRA 15 MG Tabs  Generic drug:  Suvorexant     DULERA 200-5 MCG/ACT Aero  Generic drug:  mometasone-formoterol  Inhale 2 puffs into the lungs 2 (two) times daily.     DULoxetine 60 MG capsule  Commonly known as:  CYMBALTA  Take 60 mg by mouth daily.     ezetimibe 10 MG tablet  Commonly known as:  ZETIA  Take 1 tablet (10 mg total) by mouth daily.     finasteride 5 MG tablet  Commonly known as:  PROSCAR  Take 5 mg by mouth at bedtime.     fish oil-omega-3 fatty acids 1000 MG capsule  Take 1 g by mouth 2 (two) times daily.     gemfibrozil 600 MG tablet  Commonly known as:  LOPID  Take 600 mg by mouth 2 (two) times daily.     ipratropium-albuterol 0.5-2.5 (3) MG/3ML Soln  Commonly known as:  DUONEB  Take 3 mLs by  nebulization every 6 (six) hours as needed.     isosorbide mononitrate 30 MG 24 hr tablet  Commonly known as:  IMDUR  Take 1 tablet (30 mg total) by mouth daily.     levothyroxine 25 MCG tablet  Commonly known as:  SYNTHROID, LEVOTHROID  Take 25 mcg by mouth daily before breakfast.     multivitamin tablet  Take 1 tablet by mouth daily.     nitroGLYCERIN 0.4 MG SL tablet  Commonly known as:  NITROSTAT  Place 1 tablet (0.4 mg total) under the tongue every 5 (five) minutes as needed for chest pain.     omalizumab 150 MG injection  Commonly known as:  XOLAIR  Inject 150 mg into the skin every 28 (twenty-eight) days.     PROAIR HFA 108 (90 Base) MCG/ACT inhaler  Generic drug:  albuterol  Inhale 2 puffs into the lungs every 4 (four) hours as needed for wheezing or shortness of breath.     albuterol (2.5 MG/3ML) 0.083% nebulizer solution  Commonly known as:  PROVENTIL  Take 2.5 mg by nebulization every 4 (four) hours as needed for wheezing or shortness of breath.     RAPAFLO 8 MG Caps capsule  Generic drug:  silodosin  Take 8 mg by mouth at bedtime.  vitamin C 500 MG tablet  Commonly known as:  ASCORBIC ACID  Take 500 mg by mouth 2 (two) times daily.        Past Medical History  Diagnosis Date  . Heart attack ([place]) 2009  . Renal cell carcinoma 1997    Left kidney  . GERD (gastroesophageal reflux disease)   . IBS (irritable bowel syndrome)   . Diverticulosis   . History of esophageal stricture   . Internal hemorrhoids   . Depression   . Osteoarthritis   . Peripheral neuropathy ([place])   . Depression, prolonged 06/26/2013  . Hypertension   . Hyperlipidemia   . Coronary artery disease   . Sleep talking   . Snoring 12/25/2013  . 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 Cervical vertebral fusion 05/02/2014    Now presenting with C5-6 radiculopathy  . Shoulder pain, right     Past Surgical History  Procedure Laterality Date  . Back surgery  1992, 2013  .  Neck surgery  2005  . Coronary stent placement    . Femur fracture surgery Left 1995  . Total knee arthroplasty Left 2005  . Carpal tunnel release Bilateral   . Nephrectomy Left     secondary to cancer  . Cardiac catheterization  05/10/2007    Minimal CAD, normal LV systolic function, medical management  . Cardiac catheterization  04/08/2008    RCA ulcerated 70-80% stenosis, stented with a 3x27mm Endeavor stent at 13atm for 50sec, reduced from 80% ulcerated stenosis to 0%.  . Cardiac catheterization  05/07/2009    50% distal left main disease-IVUS or flow wire too dangerous in particular setting to perform intervention.  . Cardiac catheterization  03/18/2010    Medical management  . Cardiovascular stress test  02/09/2012    Normal, no significant wall abnormalities noted  . Anterior cervical decomp/discectomy fusion N/A 08/27/2014    Procedure: Cervical six-seven anterior cervical decompression fusion with removal of hardware at Cervical five-six;  Surgeon: Erline Levine, MD;  Location: [facility];  Service: Neurosurgery;  Laterality: N/A;  Cervical six-seven anterior cervical decompression fusion with removal of hardware at Cervical five-six    Allergies  Allergen Reactions  . 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 Penicillins Hives and Itching  . Sulfonamide Derivatives Hives and Itching  . Codeine Nausea Only  . Statins Other (See Comments)    Myalgias    Review of systems negative except as noted in HPI / PMHx or noted below:  Review of Systems  Constitutional: Negative.   HENT: Negative.   Eyes: Negative.   Respiratory: Negative.   Cardiovascular: Negative.   Gastrointestinal: Negative.   Genitourinary: Negative.   Musculoskeletal: Negative.   Skin: Negative.   Neurological: Negative.   Endo/Heme/Allergies:  Negative.   Psychiatric/Behavioral: Negative.      Objective:   Filed Vitals:   01/13/16 1011  BP: 140/88  Pulse: 88  Resp: 18          Physical Exam  Constitutional: He is well-developed, well-nourished, and in no distress.  Coughing  HENT:  Head: Normocephalic.  Right Ear: Tympanic membrane, external ear and ear canal normal.  Left Ear: Tympanic membrane, external ear and ear canal normal.  Nose: Mucosal edema present. No rhinorrhea.  Mouth/Throat: Uvula  is midline, oropharynx is clear and moist and mucous membranes are normal. No oropharyngeal exudate.  Eyes: Right conjunctiva is injected. Left conjunctiva is injected.  Neck: Trachea normal. No tracheal tenderness present. No tracheal deviation present. No thyromegaly present.  Cardiovascular: Normal rate, regular rhythm, S1 normal, S2 normal and normal heart sounds.   No murmur heard. Pulmonary/Chest: No stridor. No respiratory distress. He has wheezes (Inspiratory and expiratory wheezing in all lung fields). He has no rales.  Musculoskeletal: He exhibits no edema.  Lymphadenopathy:       Head (right side): No tonsillar adenopathy present.       Head (left side): No tonsillar adenopathy present.    He has no cervical adenopathy.  Neurological: He is alert. Gait normal.  Skin: No rash noted. He is not diaphoretic. No erythema. Nails show no clubbing.  Psychiatric: Mood and affect normal.    Diagnostics:    Spirometry was performed and demonstrated an FEV1 of 1.41 at 46 % of predicted. Following the administration of nebulized albuterol his FEV1 is FEV1 rose to 2.27 which is 75% of the predicted and calculated out to an increase in the FEV1 of 62%.  The patient had an Asthma Control Test with the following results:  .    Assessment and Plan:   1. Severe persistent asthma, with acute exacerbation   2. Allergic rhinoconjunctivitis   3. LPRD (laryngopharyngeal reflux disease)     1. Depo-Medrol 80 IM delivered in  clinic today  2. DuoNeb delivered in clinic today  3. Use a combination of the following:   A. Dulera 200 - 2 inhalations twice a day  B. Qvar 80 - 2 inhalations twice a day  C. montelukast 10 mg - one tablet one time per day  D. OTC Rhinocort - one spray each nostril once a day  E. omeprazole 20 mg - one tablet twice a day  4. Prednisone 20 mg a day for 5 days then 15 mg a day for 5 days then 10 mg a day for 5 days then 5 mg a day for 5 days  5. Continue DuoNeb or ProAir HFA if needed  6. Continue Xolair and EpiPen  7. Continue nasal saline and antihistamine if needed  8. Return to clinic in 20 days or earlier if problem  Tracker is having a very significant exacerbation of his atopic respiratory disease secondary to springtime pollen exposure and I will treat him with the therapy mentioned above and see him back in this clinic in 20 days or earlier if there is a problem. In addition, we will be treating him for reflux as this appears to be active over the course of the past several months.  Allena Katz, MD [place]

## 2016-01-14 ENCOUNTER — Encounter: Payer: Self-pay | Admitting: Allergy and Immunology

## 2016-01-15 ENCOUNTER — Other Ambulatory Visit: Payer: Self-pay

## 2016-01-20 ENCOUNTER — Other Ambulatory Visit: Payer: Self-pay

## 2016-01-20 MED ORDER — FLUTICASONE PROPIONATE HFA 110 MCG/ACT IN AERO
2.0000 | INHALATION_SPRAY | Freq: Two times a day (BID) | RESPIRATORY_TRACT | Status: DC
Start: 2016-01-20 — End: 2016-07-25

## 2016-01-20 NOTE — Telephone Encounter (Signed)
Patient insurance denied Qvar secondary to cost- Preferred is Flovent. Flovent sent to pharmacy.

## 2016-01-21 ENCOUNTER — Other Ambulatory Visit: Payer: Self-pay

## 2016-01-21 MED ORDER — ALBUTEROL SULFATE HFA 108 (90 BASE) MCG/ACT IN AERS: 2.0000 | INHALATION_SPRAY | RESPIRATORY_TRACT | Status: DC | PRN

## 2016-01-21 NOTE — Telephone Encounter (Signed)
Refill sent. Patient advised. [facility]

## 2016-01-21 NOTE — Telephone Encounter (Signed)
Pt has been trying to get his pro air filled but the pharmacy will not fill it. He is going out of town tomorrow morning and would like to have it for his trip.  Please Advise  [facility]-01/13/2016

## 2016-02-02 IMAGING — CR DG FOREARM 2V*R*
2 series · 2 of 2 positions shown · non-contrast
Comparison: None.

CLINICAL DATA: Fall, left forearm pain

EXAM:
RIGHT FOREARM - 2 VIEW

[AP]
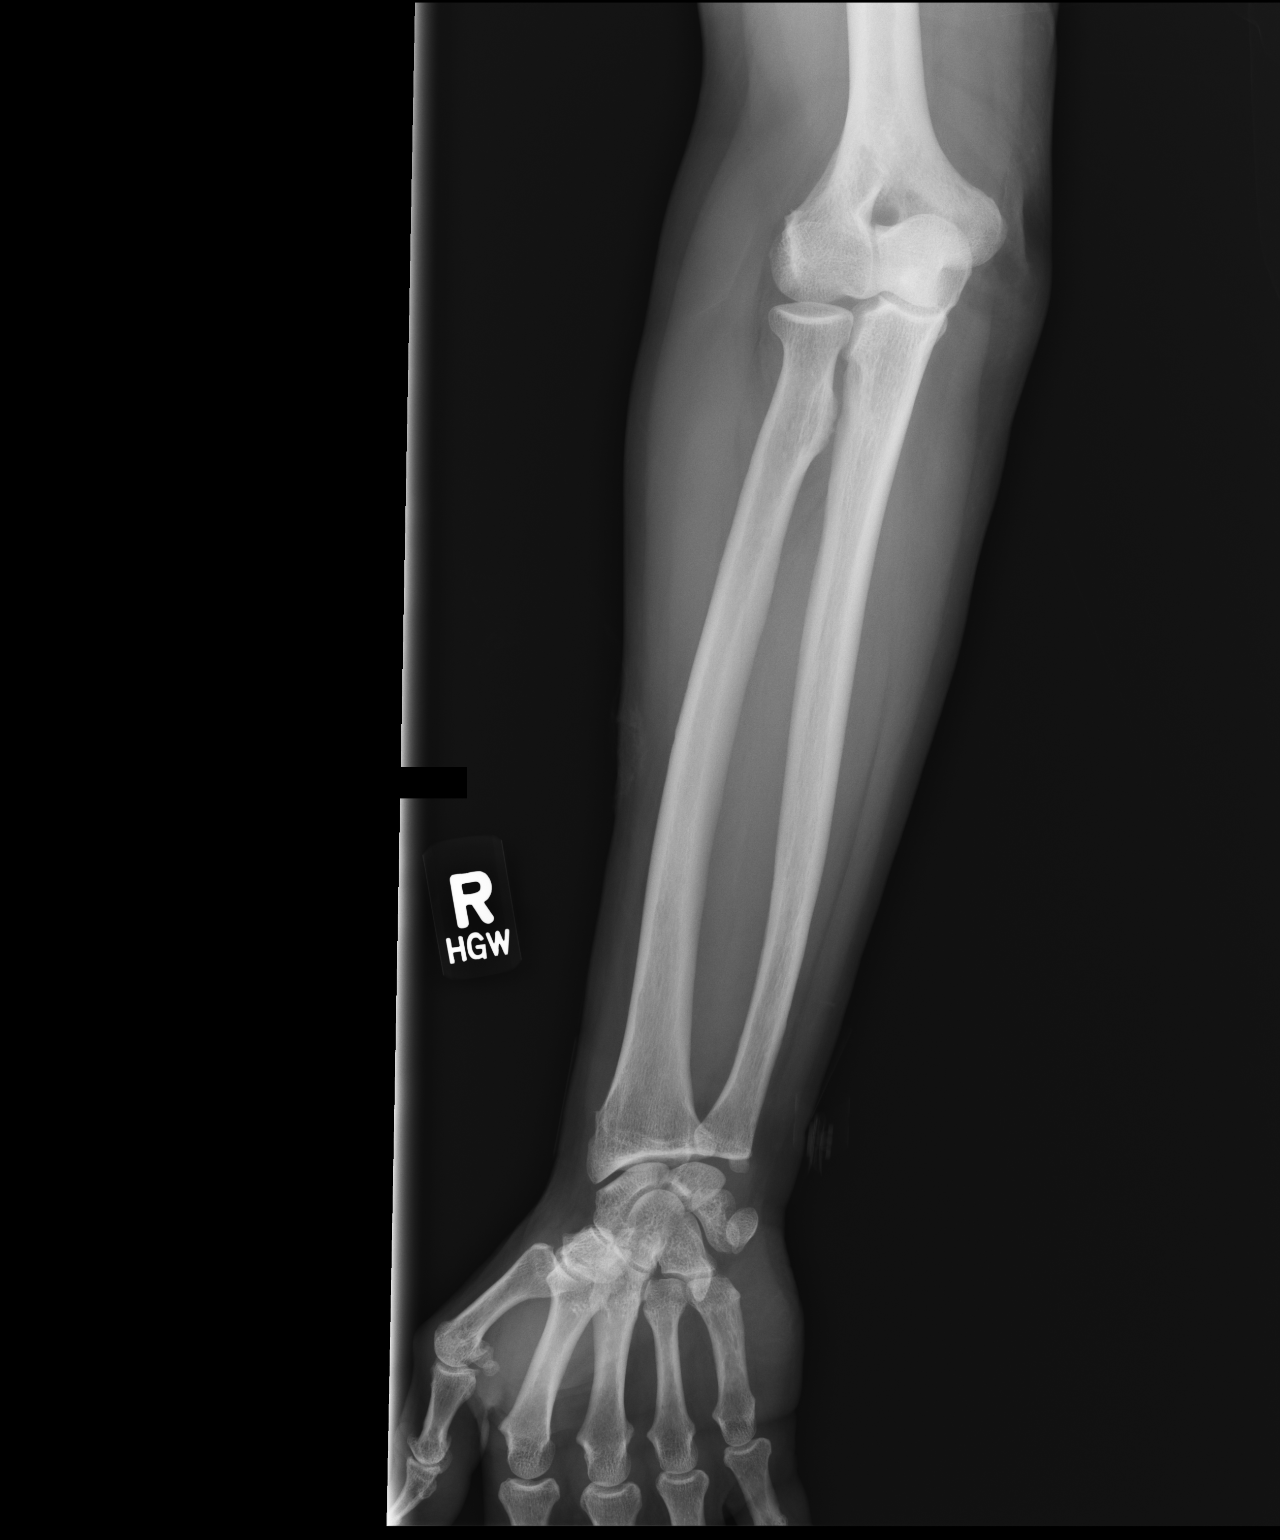

[lateral]
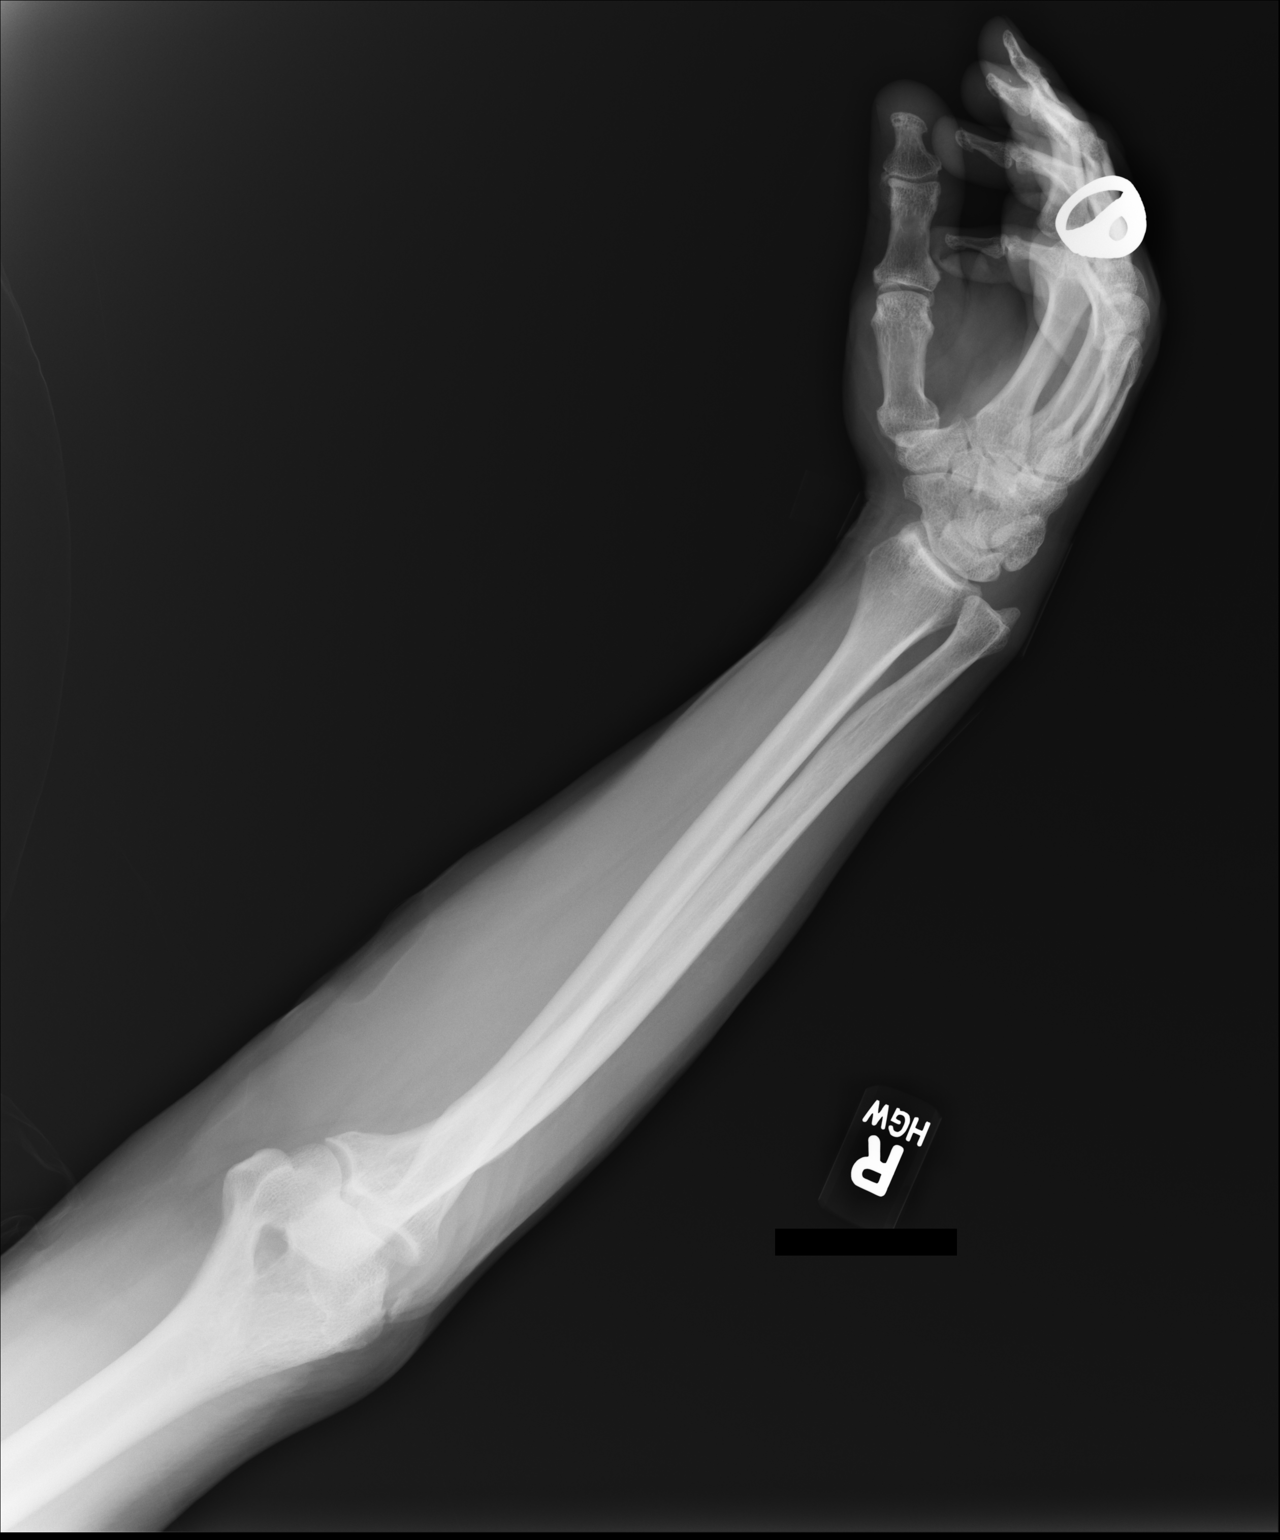

[2 of 2 positions shown; findings below may reference images not displayed]

FINDINGS: There is no evidence of fracture or other focal bone lesions. Soft
tissues are unremarkable.
IMPRESSION: Negative.

## 2016-02-02 IMAGING — CT CT ABD-PELV W/ CM
2 of 5 series · 9 of 36 positions shown, 11 images · IV contrast (Iodine)
Comparison: Chest CT 01/29/2014

CLINICAL DATA: Patient status post fall from ladder. Positive loss
of consciousness.

EXAM:
CT CHEST, ABDOMEN, AND PELVIS WITH CONTRAST
TECHNIQUE: Multidetector CT imaging of the chest, abdomen and pelvis was
performed following the standard protocol during bolus
administration of intravenous contrast.
CONTRAST:  80mL OMNIPAQUE IOHEXOL 300 MG/ML  SOLN

[Series 201: cap with, idose (2) · axial · 0.74mm/px · z∈[+140,+605]mm · 6 of 127 slices shown, 8 images]
[im 17/127  mediastinal]
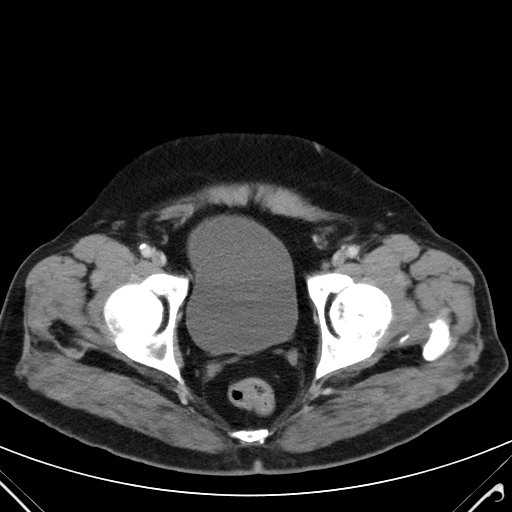
[im 17/127  lung]
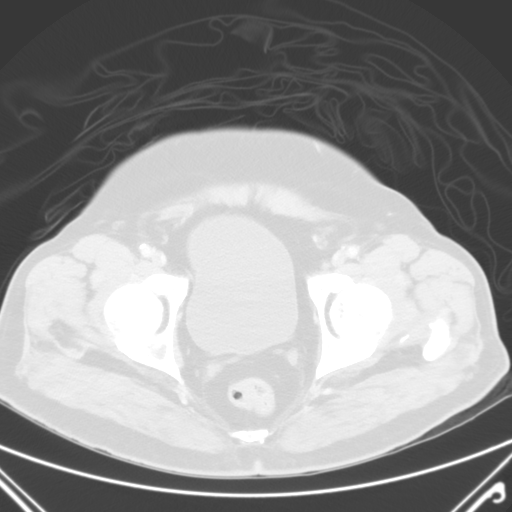
[im 34/127  lung]
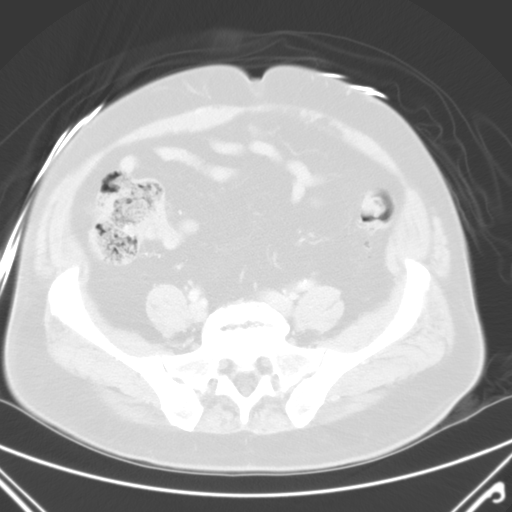
[im 51/127  lung]
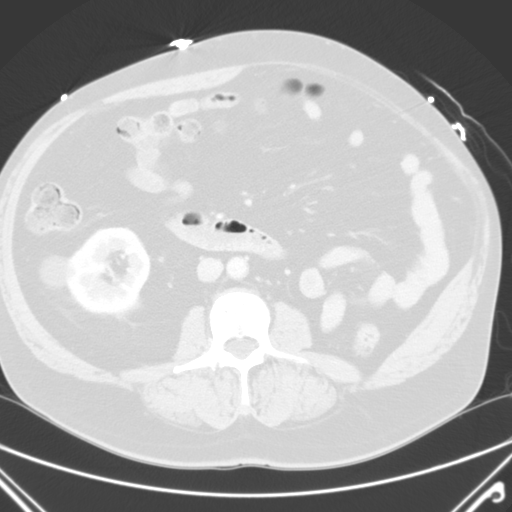
[im 76/127  lung]
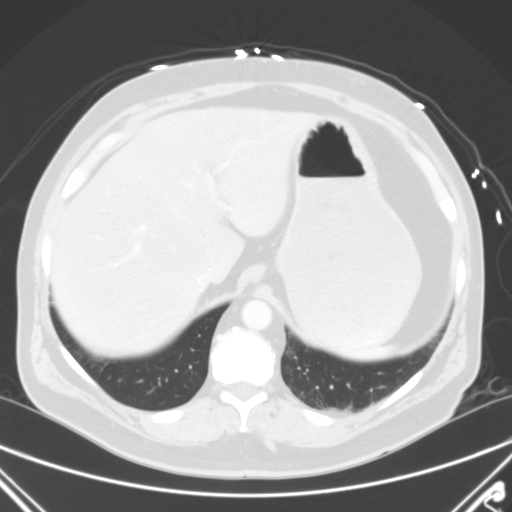
[im 93/127  mediastinal]
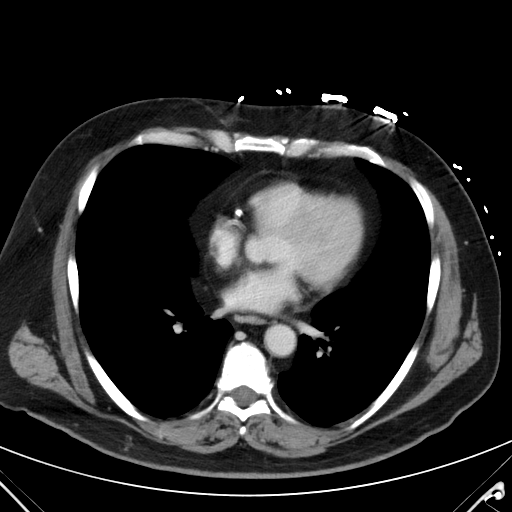
[im 93/127  lung]
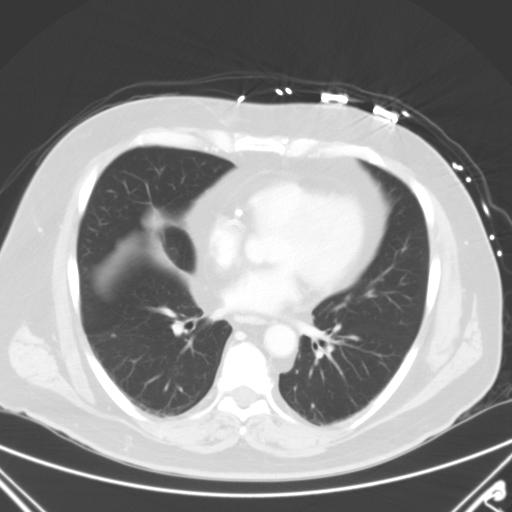
[im 110/127  lung]
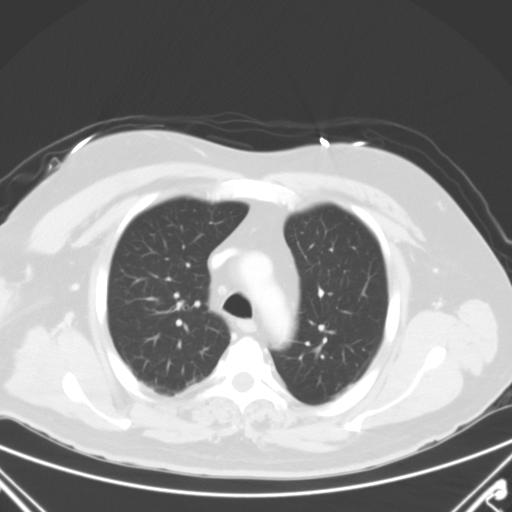

[Series 203: coronals, idose (3) · coronal · 0.50mm/px · 3 of 126 slices shown]
[im 26/126  lung]
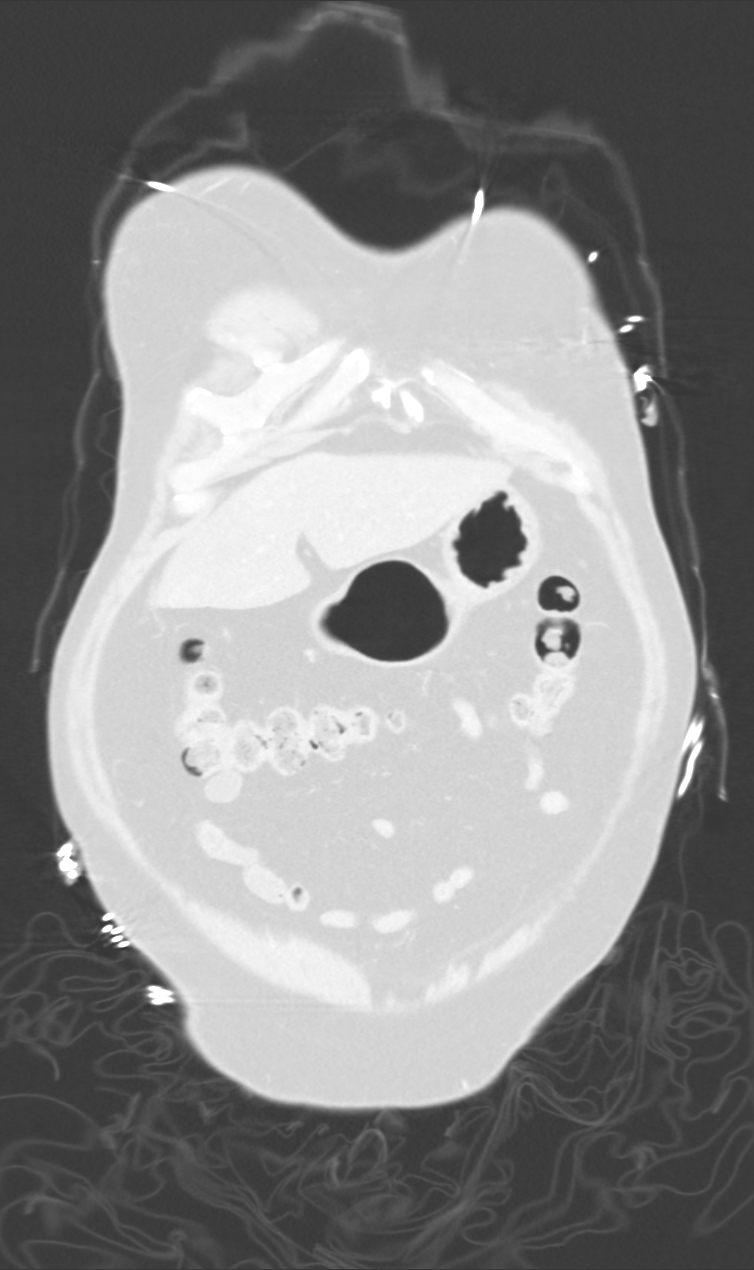
[im 51/126  lung]
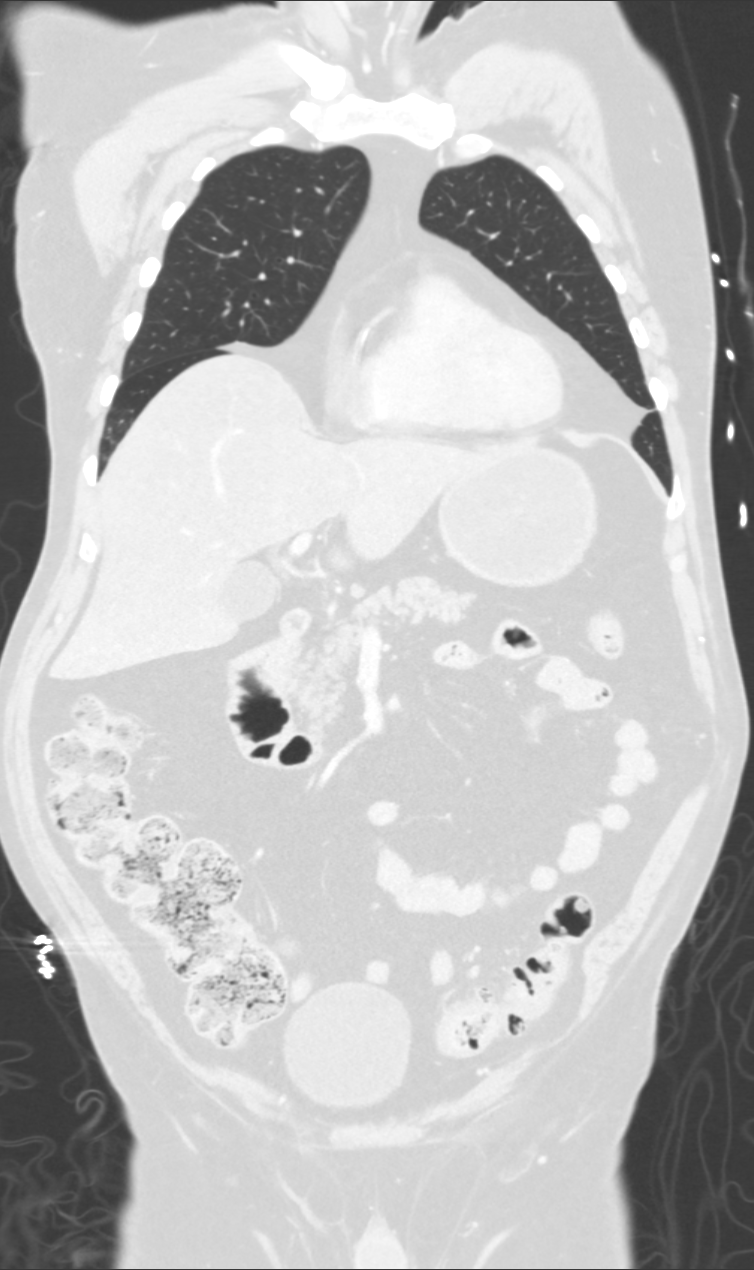
[im 76/126  lung]
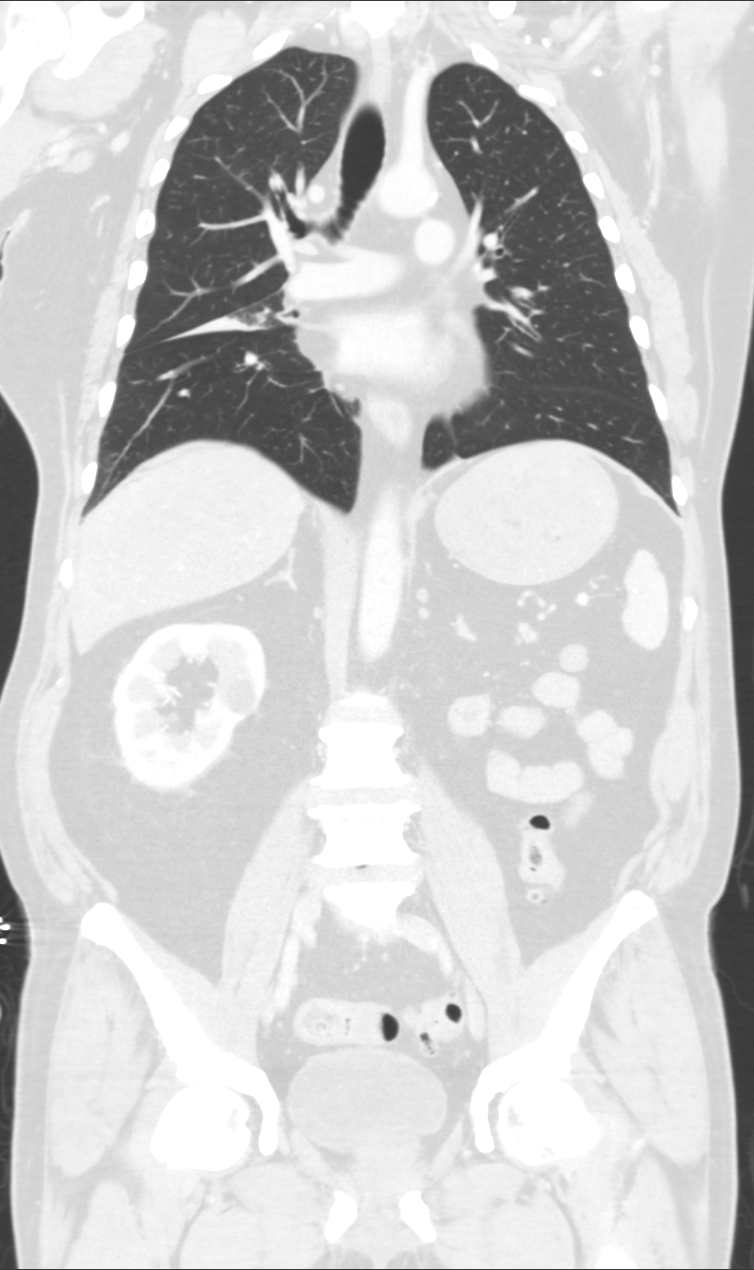

[9 of 36 positions shown; findings below may reference images not displayed]

FINDINGS: CT CHEST FINDINGS

Visualized thyroid is unremarkable. Normal heart size. No
pericardial effusion. Aorta and main pulmonary artery are normal in
caliber. Central airways are patent. 4 mm left lower lobe nodule
(image 46) is stable. 5 mm left lower lobe nodule (image 43) is
stable. Minimal dependent ground-glass opacity within the left lower
lobe. 3 mm right lower lobe nodule (image 35) is stable. Unchanged
platelike atelectasis involving the right middle lobe. No definite
pleural effusion or pneumothorax.

CT ABDOMEN AND PELVIS FINDINGS

Liver is normal in size and contour without focal hepatic lesion
identified. Small calcification within the left hepatic lobe.
Calcified granulomata within the spleen. Normal adrenal glands.

Multiple cysts within the right kidney with the largest measuring up
to 3 cm, exophytic off the inferior portion. No hydronephrosis. The
left kidney is not visualized. Normal caliber abdominal aorta. No
retroperitoneal lymphadenopathy. Fat containing bilateral inguinal
hernias. Prostate unremarkable. Urinary bladder is unremarkable.

Sigmoid colonic diverticulosis without CT evidence for acute
diverticulitis. The appendix is normal. No abnormal bowel wall
thickening or evidence for bowel obstruction. No free fluid or free
intraperitoneal air.

Nondisplaced fracture through the lateral aspect of the right fifth
rib. Postsurgical change proximal left femur.
IMPRESSION: Nondisplaced fracture of the lateral aspect of the right fifth rib.

Otherwise no acute traumatic injury within the chest, abdomen or
pelvis.

Multiple bilateral pulmonary nodules, the largest of which measures
5 mm within the left lower lobe. Recommend additional follow-up
chest CT in 12 months to ensure stability.

## 2016-02-04 ENCOUNTER — Ambulatory Visit (INDEPENDENT_AMBULATORY_CARE_PROVIDER_SITE_OTHER): Payer: Medicare Other

## 2016-02-04 DIAGNOSIS — J4540 Moderate persistent asthma, uncomplicated: Secondary | ICD-10-CM

## 2016-03-03 ENCOUNTER — Ambulatory Visit (INDEPENDENT_AMBULATORY_CARE_PROVIDER_SITE_OTHER): Payer: Medicare Other

## 2016-03-03 DIAGNOSIS — J4540 Moderate persistent asthma, uncomplicated: Secondary | ICD-10-CM

## 2016-04-05 ENCOUNTER — Ambulatory Visit (INDEPENDENT_AMBULATORY_CARE_PROVIDER_SITE_OTHER): Payer: Medicare Other | Admitting: *Deleted

## 2016-04-05 DIAGNOSIS — J4550 Severe persistent asthma, uncomplicated: Secondary | ICD-10-CM

## 2016-05-18 ENCOUNTER — Ambulatory Visit (INDEPENDENT_AMBULATORY_CARE_PROVIDER_SITE_OTHER): Payer: Medicare Other | Admitting: *Deleted

## 2016-05-18 DIAGNOSIS — J4540 Moderate persistent asthma, uncomplicated: Secondary | ICD-10-CM | POA: Diagnosis not present

## 2016-06-15 ENCOUNTER — Ambulatory Visit: Payer: Medicare Other

## 2016-06-15 ENCOUNTER — Ambulatory Visit (INDEPENDENT_AMBULATORY_CARE_PROVIDER_SITE_OTHER): Payer: Medicare Other | Admitting: *Deleted

## 2016-06-15 DIAGNOSIS — J4540 Moderate persistent asthma, uncomplicated: Secondary | ICD-10-CM | POA: Diagnosis not present

## 2016-07-06 ENCOUNTER — Other Ambulatory Visit: Payer: Self-pay | Admitting: Cardiovascular Disease

## 2016-07-07 ENCOUNTER — Other Ambulatory Visit: Payer: Self-pay | Admitting: *Deleted

## 2016-07-07 MED ORDER — EZETIMIBE 10 MG PO TABS: 10.0000 mg | ORAL_TABLET | Freq: Every day | ORAL | 0 refills | Status: DC

## 2016-07-07 NOTE — Telephone Encounter (Signed)
REFILL 

## 2016-07-13 ENCOUNTER — Ambulatory Visit (INDEPENDENT_AMBULATORY_CARE_PROVIDER_SITE_OTHER): Payer: Medicare Other

## 2016-07-13 DIAGNOSIS — J4540 Moderate persistent asthma, uncomplicated: Secondary | ICD-10-CM | POA: Diagnosis not present

## 2016-07-25 ENCOUNTER — Other Ambulatory Visit: Payer: Self-pay | Admitting: Allergy and Immunology

## 2016-08-15 ENCOUNTER — Ambulatory Visit (INDEPENDENT_AMBULATORY_CARE_PROVIDER_SITE_OTHER): Payer: Medicare Other | Admitting: *Deleted

## 2016-08-15 DIAGNOSIS — J4540 Moderate persistent asthma, uncomplicated: Secondary | ICD-10-CM | POA: Diagnosis not present

## 2016-09-08 ENCOUNTER — Ambulatory Visit (INDEPENDENT_AMBULATORY_CARE_PROVIDER_SITE_OTHER): Payer: Medicare Other | Admitting: *Deleted

## 2016-09-08 DIAGNOSIS — J4540 Moderate persistent asthma, uncomplicated: Secondary | ICD-10-CM | POA: Diagnosis not present

## 2016-09-09 ENCOUNTER — Ambulatory Visit: Payer: Medicare Other

## 2016-10-13 ENCOUNTER — Ambulatory Visit (INDEPENDENT_AMBULATORY_CARE_PROVIDER_SITE_OTHER): Payer: Medicare Other

## 2016-10-13 DIAGNOSIS — J4540 Moderate persistent asthma, uncomplicated: Secondary | ICD-10-CM | POA: Diagnosis not present

## 2016-11-09 ENCOUNTER — Ambulatory Visit (INDEPENDENT_AMBULATORY_CARE_PROVIDER_SITE_OTHER): Payer: Medicare Other | Admitting: *Deleted

## 2016-11-09 DIAGNOSIS — J4540 Moderate persistent asthma, uncomplicated: Secondary | ICD-10-CM

## 2016-11-10 ENCOUNTER — Ambulatory Visit: Payer: Medicare Other

## 2016-11-24 ENCOUNTER — Other Ambulatory Visit: Payer: Self-pay | Admitting: Cardiovascular Disease

## 2016-12-07 ENCOUNTER — Ambulatory Visit: Payer: Self-pay

## 2016-12-07 ENCOUNTER — Ambulatory Visit (INDEPENDENT_AMBULATORY_CARE_PROVIDER_SITE_OTHER): Payer: Medicare Other

## 2016-12-07 DIAGNOSIS — J4540 Moderate persistent asthma, uncomplicated: Secondary | ICD-10-CM

## 2016-12-15 ENCOUNTER — Encounter: Payer: Self-pay | Admitting: Gastroenterology

## 2016-12-26 ENCOUNTER — Other Ambulatory Visit: Payer: Self-pay | Admitting: Allergy and Immunology

## 2016-12-30 ENCOUNTER — Other Ambulatory Visit: Payer: Self-pay | Admitting: Allergy and Immunology

## 2017-01-04 ENCOUNTER — Ambulatory Visit (INDEPENDENT_AMBULATORY_CARE_PROVIDER_SITE_OTHER): Payer: Medicare Other

## 2017-01-04 DIAGNOSIS — J4540 Moderate persistent asthma, uncomplicated: Secondary | ICD-10-CM

## 2017-01-10 ENCOUNTER — Other Ambulatory Visit: Payer: Self-pay | Admitting: Cardiovascular Disease

## 2017-01-10 ENCOUNTER — Other Ambulatory Visit: Payer: Self-pay | Admitting: Allergy and Immunology

## 2017-01-10 NOTE — Telephone Encounter (Signed)
Pt called and needs to have delur

## 2017-01-10 NOTE — Telephone Encounter (Signed)
Pt called and needs to have dulera called into [facility]. [phone]

## 2017-01-23 ENCOUNTER — Telehealth: Payer: Self-pay

## 2017-01-23 NOTE — Telephone Encounter (Signed)
Patient called in today and wanted to be seen for his asthma and I offered him a 3:15 appointment but he stated that he will be at work at that time.  I informed him to call his primary or go to the urgent or ED. He understood.

## 2017-01-27 ENCOUNTER — Ambulatory Visit (INDEPENDENT_AMBULATORY_CARE_PROVIDER_SITE_OTHER): Payer: Medicare Other | Admitting: Allergy

## 2017-01-27 ENCOUNTER — Encounter: Payer: Self-pay | Admitting: Allergy

## 2017-01-27 VITALS — BP 122/68 | HR 92 | Temp 97.9°F | Resp 16 | Ht 66.0 in | Wt 190.8 lb

## 2017-01-27 DIAGNOSIS — J309 Allergic rhinitis, unspecified: Secondary | ICD-10-CM | POA: Diagnosis not present

## 2017-01-27 DIAGNOSIS — H1010 Acute atopic conjunctivitis, unspecified eye: Secondary | ICD-10-CM

## 2017-01-27 DIAGNOSIS — K219 Gastro-esophageal reflux disease without esophagitis: Secondary | ICD-10-CM

## 2017-01-27 DIAGNOSIS — J4551 Severe persistent asthma with (acute) exacerbation: Secondary | ICD-10-CM

## 2017-01-27 MED ORDER — OMEPRAZOLE 20 MG PO CPDR: 20.0000 mg | DELAYED_RELEASE_CAPSULE | Freq: Every day | ORAL | 5 refills | Status: DC

## 2017-01-27 MED ORDER — MOMETASONE FURO-FORMOTEROL FUM 200-5 MCG/ACT IN AERO: 2.0000 | INHALATION_SPRAY | Freq: Two times a day (BID) | RESPIRATORY_TRACT | 5 refills | Status: DC

## 2017-01-27 MED ORDER — IPRATROPIUM-ALBUTEROL 0.5-2.5 (3) MG/3ML IN SOLN: 3.0000 mL | Freq: Four times a day (QID) | RESPIRATORY_TRACT | 3 refills | Status: DC | PRN

## 2017-01-27 MED ORDER — MONTELUKAST SODIUM 10 MG PO TABS: 10.0000 mg | ORAL_TABLET | Freq: Every day | ORAL | 5 refills | Status: DC

## 2017-01-27 MED ORDER — METHYLPREDNISOLONE ACETATE 80 MG/ML IJ SUSP: 80.0000 mg | Freq: Once | INTRAMUSCULAR | Status: DC

## 2017-01-27 MED ORDER — BECLOMETHASONE DIPROP HFA 80 MCG/ACT IN AERB: 2.0000 | INHALATION_SPRAY | Freq: Two times a day (BID) | RESPIRATORY_TRACT | 5 refills | Status: DC

## 2017-01-27 MED ORDER — ALBUTEROL SULFATE HFA 108 (90 BASE) MCG/ACT IN AERS: INHALATION_SPRAY | RESPIRATORY_TRACT | 0 refills | Status: DC

## 2017-01-27 NOTE — Patient Instructions (Addendum)
  1. Depo-Medrol 80 IM delivered in clinic today  2. DuoNeb delivered in clinic today  3. Use a combination of the following:   A. Dulera 200 - 2 inhalations twice a day  B. Qvar 80 - 2 inhalations twice a day  C. Restart montelukast 10 mg - one tablet one time per day  D. OTC Nasacort- one spray each nostril once a day  E. omeprazole 20 mg - one tablet twice a day  4. Prednisone 20 mg twice a day x 3 days, then 20mg  x 3 day, then 10mg  x 3 day then stop  5. Continue DuoNeb or ProAir HFA if needed  6. Continue Xolair monthly and EpiPen  7. Continue nasal saline and antihistamine if needed  8. Return to clinic in 3-4 months or sooner if needed.  Let us know if symptoms are not improving.

## 2017-01-27 NOTE — Progress Notes (Signed)
Follow-up Note  RE: Jared [name] MRN: [id number] DOB: [birth year] Date of Office Visit: 01/27/2017   History of present illness: Jared [name] is a 66 y.o. male presenting today for sick visit.  He was last seen in the office on 01/13/16 by Dr. Neldon Mc at which time he was treated for an asthma exacerbation.   He reports having a dry cough and reports sometimes the cough is uncontrollable.  He also reports SOB, chest tightness and wheezing.  He states the wheezing is worse at night and keeps.  He has been using his albuterol nebulizer two times a day for past 3-4 days.  Also having increased nasal congestion.  He denies any fever or sick contacts.   He does take Dulera 200 2 puffs twice a day when sick.  He reports he is currently out of his Dulera.  He states he takes Dulera 1 puff twice a day when he is well.   He added in flovent 2 puffs twice a day when he is sick which he started about a week ago.  He is also on monthly Xolair which he reports he can tell an improvement in his asthm a symptoms as well as decrease in exacerbations since starting Xolair.      He reports having a cold/URI about a month ago which he reports has not fully resolved.    Review of systems: Review of Systems  Constitutional: Positive for malaise/fatigue. Negative for chills and fever.  HENT: Positive for congestion. Negative for ear discharge, ear pain, nosebleeds, sinus pain, sore throat and tinnitus.   Eyes: Negative for pain, discharge and redness.  Respiratory: Positive for cough, shortness of breath and wheezing. Negative for sputum production.   Cardiovascular: Negative for chest pain.  Gastrointestinal: Negative for abdominal pain, diarrhea, heartburn, nausea and vomiting.  Musculoskeletal: Negative for joint pain and myalgias.  Skin: Negative for itching and rash.  Neurological: Negative for headaches.    All other systems negative unless noted above in HPI  Past  medical/social/surgical/family history have been reviewed and are unchanged unless specifically indicated below.  No changes  Medication List: Allergies as of 01/27/2017      Reactions   Contrast Media [iodinated Diagnostic Agents] Other (See Comments)   Was told not to take d/t pt only having 1 kidney   Nsaids Other (See Comments)   Was told not to take d/t pt only having 1 kidney   Penicillins Hives, Itching   Sulfonamide Derivatives Hives, Itching   Codeine Nausea Only   Statins Other (See Comments)   Myalgias      Medication List       Accurate as of 01/27/17 11:20 AM. Always use your most recent med list.          AEROCHAMBER PLUS inhaler Use as instructed   albuterol (2.5 MG/3ML) 0.083% nebulizer solution Commonly known as:  PROVENTIL Take 2.5 mg by nebulization every 4 (four) hours as needed for wheezing or shortness of breath.   albuterol 108 (90 Base) MCG/ACT inhaler Commonly known as:  PROAIR HFA Inhale two puffs every 4-6 hours if needed for cough or wheeze   ARIPiprazole 15 MG tablet Commonly known as:  ABILIFY Take 15 mg by mouth daily.   Armodafinil 250 MG tablet   aspirin 325 MG tablet Take 325 mg by mouth daily.   BELSOMRA 20 MG Tabs Generic drug:  Suvorexant   clotrimazole-betamethasone cream Commonly known as:  LOTRISONE APPLY A SMALL AMOUNT TO  AFFECTED AREA NIGHTLY   DULERA 200-5 MCG/ACT Aero Generic drug:  mometasone-formoterol Inhale 2 puffs into the lungs 2 (two) times daily.   DULoxetine 60 MG capsule Commonly known as:  CYMBALTA Take 60 mg by mouth daily.   ezetimibe 10 MG tablet Commonly known as:  ZETIA take 1 tablet by mouth once daily . NEED OFFICE VISIT   finasteride 5 MG tablet Commonly known as:  PROSCAR Take 5 mg by mouth at bedtime.   fish oil-omega-3 fatty acids 1000 MG capsule Take 1 g by mouth 2 (two) times daily.   FLOVENT HFA 110 MCG/ACT inhaler Generic drug:  fluticasone inhale 2 puffs INTO LUNGS twice a  day   gemfibrozil 600 MG tablet Commonly known as:  LOPID Take 600 mg by mouth 2 (two) times daily.   ipratropium-albuterol 0.5-2.5 (3) MG/3ML Soln Commonly known as:  DUONEB Take 3 mLs by nebulization every 6 (six) hours as needed.   isosorbide mononitrate 30 MG 24 hr tablet Commonly known as:  IMDUR Take 1 tablet (30 mg total) by mouth daily. Please make appointment for future refills.   levothyroxine 25 MCG tablet Commonly known as:  SYNTHROID, LEVOTHROID Take 25 mcg by mouth daily before breakfast.   montelukast 10 MG tablet Commonly known as:  SINGULAIR Take 1 tablet (10 mg total) by mouth at bedtime.   multivitamin tablet Take 1 tablet by mouth daily.   nitroGLYCERIN 0.4 MG SL tablet Commonly known as:  NITROSTAT Place 1 tablet (0.4 mg total) under the tongue every 5 (five) minutes as needed for chest pain.   omalizumab 150 MG injection Commonly known as:  XOLAIR Inject 150 mg into the skin every 28 (twenty-eight) days.   omeprazole 20 MG capsule Commonly known as:  PRILOSEC Take 1 capsule (20 mg total) by mouth daily.   RAPAFLO 8 MG Caps capsule Generic drug:  silodosin Take 8 mg by mouth at bedtime.   vitamin C 500 MG tablet Commonly known as:  ASCORBIC ACID Take 500 mg by mouth 2 (two) times daily.       Known medication allergies: Allergies  Allergen Reactions  . 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 Penicillins Hives and Itching  . Sulfonamide Derivatives Hives and Itching  . Codeine Nausea Only  . Statins Other (See Comments)    Myalgias     Physical examination: Blood pressure 122/68, pulse 92, temperature 97.9 F (36.6 C), temperature source Oral, resp. rate 16, height 5\' 6"  (1.676 m), weight 190 lb 12.8 oz (86.5 kg), SpO2 92 %.  General: Alert, interactive, in no acute  distress. HEENT: TMs pearly gray, turbinates mildly edematous with clear discharge, post-pharynx non erythematous. Neck: Supple without lymphadenopathy. Lungs: Mildly decreased breath sounds with expiratory wheezing bilaterally. {no increased work of breathing.  Coughing throughout encounter.   Post duoneb improvement in aeration but still with mild wheezing CV: Normal S1, S2 without murmurs. Abdomen: Nondistended, nontender. Skin: Warm and dry, without lesions or rashes. Extremities:  No clubbing, cyanosis or edema. Neuro:   Grossly intact.  Diagnositics/Labs:  Spirometry: FEV1: 1.69L  63%, FVC: 2.43L  72%, post duoneb FEV1 improved to 1.98L 74%  Assessment and plan:   Severe persistent asthma with acute exacerbation Allergic rhinoconjunctivitis LPRD  1. Depo-Medrol 80 IM delivered in  clinic today 2. DuoNeb delivered in clinic today 3. Use a combination of the following:   A. Dulera 200 - 2 inhalations twice a day  B. Qvar 80 - 2 inhalations twice a day  C. Restart montelukast 10 mg - one tablet one time per day  D. OTC Nasacort- one spray each nostril once a day  E. omeprazole 20 mg - one tablet twice a day  4. Prednisone 20 mg twice a day x 3 days, then 20mg  x 1 day, then 10mg  x 1 day then stop 5. Continue DuoNeb or ProAir HFA if needed 6. Continue Xolair monthly and EpiPen 7. Continue nasal saline and antihistamine if needed 8. Return to clinic in 3-4 months or sooner if needed.  Let us know if symptoms are not improving.    I appreciate the opportunity to take part in Jared [name]'s care. Please do not hesitate to contact me with questions.  Sincerely,   Prudy Feeler, MD Allergy/Immunology [facility]

## 2017-02-01 ENCOUNTER — Ambulatory Visit: Payer: Self-pay

## 2017-02-28 ENCOUNTER — Other Ambulatory Visit: Payer: Self-pay | Admitting: Cardiovascular Disease

## 2017-03-17 ENCOUNTER — Ambulatory Visit (INDEPENDENT_AMBULATORY_CARE_PROVIDER_SITE_OTHER): Payer: Medicare Other

## 2017-03-17 DIAGNOSIS — J4550 Severe persistent asthma, uncomplicated: Secondary | ICD-10-CM | POA: Diagnosis not present

## 2017-03-17 DIAGNOSIS — J4551 Severe persistent asthma with (acute) exacerbation: Secondary | ICD-10-CM

## 2017-03-28 ENCOUNTER — Other Ambulatory Visit: Payer: Self-pay | Admitting: Cardiovascular Disease

## 2017-03-30 ENCOUNTER — Other Ambulatory Visit: Payer: Self-pay | Admitting: Cardiovascular Disease

## 2017-04-02 ENCOUNTER — Other Ambulatory Visit: Payer: Self-pay | Admitting: Allergy

## 2017-04-02 DIAGNOSIS — J4551 Severe persistent asthma with (acute) exacerbation: Secondary | ICD-10-CM

## 2017-04-06 ENCOUNTER — Telehealth: Payer: Self-pay | Admitting: Cardiovascular Disease

## 2017-04-06 MED ORDER — ISOSORBIDE MONONITRATE ER 30 MG PO TB24: 30.0000 mg | ORAL_TABLET | Freq: Every day | ORAL | 0 refills | Status: DC

## 2017-04-06 NOTE — Telephone Encounter (Signed)
New message    *STAT* If patient is at the pharmacy, call can be transferred to refill team.   1. Which medications need to be refilled? (please list name of each medication and dose if known) isosorbide mononitrate (IMDUR) 30 MG 24 hr tablet  2. Which pharmacy/location (including street and city if local pharmacy) is medication to be sent to? [facility] [address]  3. Do they need a 30 day or 90 day supply? appt made for august 6th

## 2017-04-10 ENCOUNTER — Encounter: Payer: Self-pay | Admitting: Gastroenterology

## 2017-04-14 ENCOUNTER — Ambulatory Visit: Payer: Self-pay

## 2017-04-14 ENCOUNTER — Ambulatory Visit (INDEPENDENT_AMBULATORY_CARE_PROVIDER_SITE_OTHER): Payer: Medicare Other

## 2017-04-14 DIAGNOSIS — J4551 Severe persistent asthma with (acute) exacerbation: Secondary | ICD-10-CM

## 2017-04-14 DIAGNOSIS — J4540 Moderate persistent asthma, uncomplicated: Secondary | ICD-10-CM | POA: Diagnosis not present

## 2017-04-24 ENCOUNTER — Ambulatory Visit: Payer: Medicare Other | Admitting: Cardiology

## 2017-05-11 ENCOUNTER — Ambulatory Visit (INDEPENDENT_AMBULATORY_CARE_PROVIDER_SITE_OTHER): Payer: Medicare Other | Admitting: *Deleted

## 2017-05-11 DIAGNOSIS — J4550 Severe persistent asthma, uncomplicated: Secondary | ICD-10-CM | POA: Diagnosis not present

## 2017-05-15 ENCOUNTER — Ambulatory Visit (AMBULATORY_SURGERY_CENTER): Payer: Self-pay

## 2017-05-15 VITALS — Ht 66.5 in | Wt 191.4 lb

## 2017-05-15 DIAGNOSIS — Z860100 Personal history of colon polyps, unspecified: Secondary | ICD-10-CM

## 2017-05-15 MED ORDER — SUPREP BOWEL PREP KIT 17.5-3.13-1.6 GM/177ML PO SOLN: 1.0000 | Freq: Once | ORAL | 0 refills | Status: AC

## 2017-05-15 NOTE — Progress Notes (Signed)
No allergies to eggs or soy No past problems with anesthesia No diet meds No home oxygen  Declined emmi 

## 2017-05-16 ENCOUNTER — Encounter: Payer: Self-pay | Admitting: Gastroenterology

## 2017-05-18 ENCOUNTER — Other Ambulatory Visit: Payer: Self-pay | Admitting: Cardiovascular Disease

## 2017-05-25 ENCOUNTER — Encounter: Payer: Self-pay | Admitting: Gastroenterology

## 2017-05-25 ENCOUNTER — Ambulatory Visit (AMBULATORY_SURGERY_CENTER): Payer: Medicare Other | Admitting: Gastroenterology

## 2017-05-25 VITALS — BP 112/59 | HR 65 | Temp 98.7°F | Resp 15 | Ht 66.0 in | Wt 190.0 lb

## 2017-05-25 DIAGNOSIS — D125 Benign neoplasm of sigmoid colon: Secondary | ICD-10-CM

## 2017-05-25 DIAGNOSIS — Z86010 Personal history of colonic polyps: Secondary | ICD-10-CM | POA: Diagnosis not present

## 2017-05-25 DIAGNOSIS — D123 Benign neoplasm of transverse colon: Secondary | ICD-10-CM

## 2017-05-25 DIAGNOSIS — D122 Benign neoplasm of ascending colon: Secondary | ICD-10-CM | POA: Diagnosis not present

## 2017-05-25 MED ORDER — SODIUM CHLORIDE 0.9 % IV SOLN: 500.0000 mL | INTRAVENOUS | Status: DC

## 2017-05-25 NOTE — Progress Notes (Signed)
Report given to PACU, vss 

## 2017-05-25 NOTE — Patient Instructions (Signed)
YOU HAD AN ENDOSCOPIC PROCEDURE TODAY AT THE [facility]:   Refer to the procedure report that was given to you for any specific questions about what was found during the examination.  If the procedure report does not answer your questions, please call your gastroenterologist to clarify.  If you requested that your care partner not be given the details of your procedure findings, then the procedure report has been included in a sealed envelope for you to review at your convenience later.  YOU SHOULD EXPECT: Some feelings of bloating in the abdomen. Passage of more gas than usual.  Walking can help get rid of the air that was put into your GI tract during the procedure and reduce the bloating. If you had a lower endoscopy (such as a colonoscopy or flexible sigmoidoscopy) you may notice spotting of blood in your stool or on the toilet paper. If you underwent a bowel prep for your procedure, you may not have a normal bowel movement for a few days.  Please Note:  You might notice some irritation and congestion in your nose or some drainage.  This is from the oxygen used during your procedure.  There is no need for concern and it should clear up in a day or so.  SYMPTOMS TO REPORT IMMEDIATELY:   Following lower endoscopy (colonoscopy or flexible sigmoidoscopy):  Excessive amounts of blood in the stool  Significant tenderness or worsening of abdominal pains  Swelling of the abdomen that is new, acute  Fever of 100F or higher   For urgent or emergent issues, a gastroenterologist can be reached at any hour by calling [phone].   DIET:  We do recommend a small meal at first, but then you may proceed to your regular diet.  Drink plenty of fluids but you should avoid alcoholic beverages for 24 hours.  ACTIVITY:  You should plan to take it easy for the rest of today and you should NOT DRIVE or use heavy machinery until tomorrow (because of the sedation medicines used during the test).     FOLLOW UP: Our staff will call the number listed on your records the next business day following your procedure to check on you and address any questions or concerns that you may have regarding the information given to you following your procedure. If we do not reach you, we will leave a message.  However, if you are feeling well and you are not experiencing any problems, there is no need to return our call.  We will assume that you have returned to your regular daily activities without incident.  If any biopsies were taken you will be contacted by phone or by letter within the next 1-3 weeks.  Please call us at [phone] if you have not heard about the biopsies in 3 weeks.    SIGNATURES/CONFIDENTIALITY: You and/or your care partner have signed paperwork which will be entered into your electronic medical record.  These signatures attest to the fact that that the information above on your After Visit Summary has been reviewed and is understood.  Full responsibility of the confidentiality of this discharge information lies with you and/or your care-partner.  Read all of the handouts given to you by your recovery room nurse. 

## 2017-05-25 NOTE — Op Note (Signed)
[facility] Patient Name: Jared [name] Procedure Date: 05/25/2017 1:27 PM MRN: [id number] Endoscopist: Remo Lipps P. Dynesha Woolen MD, MD Age: 67 Referring MD:  Date of Birth: 09/09/1951 Gender: Male Account #: [id number] Procedure:                Colonoscopy Indications:              High risk colon cancer surveillance: Personal                            history of colonic polyps Medicines:                Monitored Anesthesia Care Procedure:                Pre-Anesthesia Assessment:                           - Prior to the procedure, a History and Physical                            was performed, and patient medications and                            allergies were reviewed. The patient's tolerance of                            previous anesthesia was also reviewed. The risks                            and benefits of the procedure and the sedation                            options and risks were discussed with the patient.                            All questions were answered, and informed consent                            was obtained. Prior Anticoagulants: The patient has                            taken aspirin, last dose was 1 day prior to                            procedure. ASA Grade Assessment: III - A patient                            with severe systemic disease. After reviewing the                            risks and benefits, the patient was deemed in                            satisfactory condition to undergo the procedure.  After obtaining informed consent, the colonoscope                            was passed under direct vision. Throughout the                            procedure, the patient's blood pressure, pulse, and                            oxygen saturations were monitored continuously. The                            Colonoscope was introduced through the anus and                            advanced to the the cecum,  identified by                            appendiceal orifice and ileocecal valve. The                            colonoscopy was performed without difficulty. The                            patient tolerated the procedure well. The quality                            of the bowel preparation was good. The terminal                            ileum, ileocecal valve, appendiceal orifice, and                            rectum were photographed. Scope In: 1:34:37 PM Scope Out: 1:59:59 PM Scope Withdrawal Time: 0 hours 18 minutes 23 seconds  Total Procedure Duration: 0 hours 25 minutes 22 seconds  Findings:                 The perianal and digital rectal examinations were                            normal.                           A 4 to 5 mm polyp was found in the transverse                            colon. The polyp was sessile. The polyp was removed                            with a cold snare. Resection and retrieval were                            complete.  A diminutive polyp was found in the ascending                            colon. The polyp was sessile. The polyp was removed                            with a cold snare. Resection and retrieval were                            complete.                           Two sessile polyps were found in the sigmoid colon.                            The polyps were 3 to 4 mm in size, one of which had                            a gross appearance of an inflammatory polyp                            associated with diverticulosis. These polyps were                            removed with a cold snare. Resection and retrieval                            were complete.                           Many large-mouthed diverticula were found in the                            entire colon, highest burden in the sigmoid colon                            with severe diverticulosis.                           Internal hemorrhoids were  found during retroflexion.                           The exam was otherwise without abnormality. Complications:            No immediate complications. Estimated blood loss:                            Minimal. Estimated Blood Loss:     Estimated blood loss was minimal. Impression:               - One 4 to 5 mm polyp in the transverse colon,                            removed with a cold snare. Resected and retrieved.                           -  One diminutive polyp in the ascending colon,                            removed with a cold snare. Resected and retrieved.                           - Two 3 to 4 mm polyps in the sigmoid colon,                            removed with a cold snare. Resected and retrieved.                           - Diverticulosis in the entire examined colon,                            severe in the left colon.                           - Internal hemorrhoids.                           - The examination was otherwise normal. Recommendation:           - Patient has a contact number available for                            emergencies. The signs and symptoms of potential                            delayed complications were discussed with the                            patient. Return to normal activities tomorrow.                            Written discharge instructions were provided to the                            patient.                           - Resume previous diet.                           - Continue present medications.                           - Await pathology results.                           - Repeat colonoscopy is recommended for                            surveillance. The colonoscopy date will be  determined after pathology results from today's                            exam become available for review.                           - No ibuprofen, naproxen, or other non-steroidal                             anti-inflammatory drugs for 2 weeks after polyp                            removal. Remo Lipps P. Wayne Wicklund MD, MD 05/25/2017 2:05:51 PM This report has been signed electronically.

## 2017-05-25 NOTE — Progress Notes (Signed)
Called to room to assist during endoscopic procedure.  Patient ID and intended procedure confirmed with present staff. Received instructions for my participation in the procedure from the performing physician.  

## 2017-05-26 ENCOUNTER — Telehealth: Payer: Self-pay | Admitting: *Deleted

## 2017-05-26 NOTE — Telephone Encounter (Signed)
  Follow up Call-  Call back number 05/25/2017  Post procedure Call Back phone  # [phone]  Permission to leave phone message Yes  Some recent data might be hidden    [facility]

## 2017-05-26 NOTE — Telephone Encounter (Signed)
  Follow up Call-  Call back number 05/25/2017  Post procedure Call Back phone  # [phone]  Permission to leave phone message Yes  Some recent data might be hidden    [facility]

## 2017-05-30 ENCOUNTER — Encounter: Payer: Self-pay | Admitting: Gastroenterology

## 2017-06-03 ENCOUNTER — Other Ambulatory Visit: Payer: Self-pay | Admitting: Cardiovascular Disease

## 2017-06-05 NOTE — Telephone Encounter (Signed)
Rx(s) sent to pharmacy electronically.  

## 2017-06-08 ENCOUNTER — Ambulatory Visit: Payer: Medicare Other

## 2017-06-11 ENCOUNTER — Other Ambulatory Visit: Payer: Self-pay | Admitting: Allergy and Immunology

## 2017-06-16 ENCOUNTER — Ambulatory Visit (INDEPENDENT_AMBULATORY_CARE_PROVIDER_SITE_OTHER): Payer: Medicare Other

## 2017-06-16 DIAGNOSIS — J4550 Severe persistent asthma, uncomplicated: Secondary | ICD-10-CM | POA: Diagnosis not present

## 2017-06-20 ENCOUNTER — Other Ambulatory Visit: Payer: Self-pay | Admitting: Cardiovascular Disease

## 2017-06-26 ENCOUNTER — Ambulatory Visit (INDEPENDENT_AMBULATORY_CARE_PROVIDER_SITE_OTHER): Payer: Medicare Other | Admitting: Podiatry

## 2017-06-26 ENCOUNTER — Ambulatory Visit: Payer: Medicare Other | Admitting: Allergy & Immunology

## 2017-06-26 ENCOUNTER — Ambulatory Visit (INDEPENDENT_AMBULATORY_CARE_PROVIDER_SITE_OTHER): Payer: Medicare Other

## 2017-06-26 DIAGNOSIS — M79671 Pain in right foot: Secondary | ICD-10-CM

## 2017-06-26 DIAGNOSIS — M722 Plantar fascial fibromatosis: Secondary | ICD-10-CM

## 2017-06-26 DIAGNOSIS — M79672 Pain in left foot: Secondary | ICD-10-CM

## 2017-06-26 MED ORDER — BETAMETHASONE SOD PHOS & ACET 6 (3-3) MG/ML IJ SUSP: 3.0000 mg | Freq: Once | INTRAMUSCULAR | Status: DC

## 2017-06-27 ENCOUNTER — Ambulatory Visit (INDEPENDENT_AMBULATORY_CARE_PROVIDER_SITE_OTHER): Payer: Medicare Other | Admitting: Allergy and Immunology

## 2017-06-27 ENCOUNTER — Encounter: Payer: Self-pay | Admitting: Allergy and Immunology

## 2017-06-27 VITALS — BP 146/92 | HR 84 | Resp 18

## 2017-06-27 DIAGNOSIS — D721 Eosinophilia, unspecified: Secondary | ICD-10-CM

## 2017-06-27 DIAGNOSIS — K219 Gastro-esophageal reflux disease without esophagitis: Secondary | ICD-10-CM | POA: Diagnosis not present

## 2017-06-27 DIAGNOSIS — J3089 Other allergic rhinitis: Secondary | ICD-10-CM

## 2017-06-27 DIAGNOSIS — J4551 Severe persistent asthma with (acute) exacerbation: Secondary | ICD-10-CM | POA: Diagnosis not present

## 2017-06-27 MED ORDER — METHYLPREDNISOLONE ACETATE 80 MG/ML IJ SUSP
80.0000 mg | Freq: Once | INTRAMUSCULAR | Status: AC
  Administered 2017-06-27: 80 mg via INTRAMUSCULAR

## 2017-06-27 MED ORDER — OMEPRAZOLE 20 MG PO CPDR: DELAYED_RELEASE_CAPSULE | ORAL | 5 refills | Status: DC

## 2017-06-27 MED ORDER — IPRATROPIUM-ALBUTEROL 0.5-2.5 (3) MG/3ML IN SOLN
3.0000 mL | Freq: Once | RESPIRATORY_TRACT | Status: AC
  Administered 2017-06-27: 3 mL via RESPIRATORY_TRACT

## 2017-06-27 NOTE — Progress Notes (Signed)
   Subjective: Patient presents today for pain and tenderness in the plantar aspect of feet bilaterally that began about three weeks ago. Patient states that it hurts in the morning with the first steps out of bed. He states he has seen Dr. Amalia Hailey in the past for plantar fasciitis and had inserts made. Patient presents today for further treatment and evaluation.   Past Medical History:  Diagnosis Date  . Cervical vertebral fusion 05/02/2014   Now presenting with C5-6 radiculopathy  . Coronary artery disease   . Depression   . Depression, prolonged 06/26/2013  . Diverticulosis   . GERD (gastroesophageal reflux disease)   . Heart attack ([place]) 2009  . History of esophageal stricture   . Hyperlipidemia   . Hypertension   . IBS (irritable bowel syndrome)   . Internal hemorrhoids   . Osteoarthritis   . 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 Peripheral neuropathy   . Renal cell carcinoma 1997   Left kidney  . Shoulder pain, right   . Sleep talking   . Snoring 12/25/2013    Objective: Physical Exam General: The patient is alert and oriented x3 in no acute distress.  Dermatology: Skin is warm, dry and supple bilateral lower extremities. Negative for open lesions or macerations bilateral.   Vascular: Dorsalis Pedis and Posterior Tibial pulses palpable bilateral.  Capillary fill time is immediate to all digits.  Neurological: Epicritic and protective threshold intact bilateral.   Musculoskeletal: Tenderness to palpation at the medial calcaneal tubercale and through the insertion of the plantar fascia of the bilateral feet. All other joints range of motion within normal limits bilateral. Strength 5/5 in all groups bilateral.   Radiographic exam: Normal osseous mineralization. Joint spaces preserved. No fracture/dislocation/boney destruction. Calcaneal spur present with mild thickening of plantar fascia bilateral. No other soft tissue abnormalities or radiopaque foreign bodies.    Assessment: 1. plantar fasciitis bilateral feet  Plan of Care:  1. Patient evaluated. Xrays reviewed.   2. Injection of 0.5cc Celestone soluspan injected into the bilateral heels.  3. Prescription for meloxicam given to patient.  4. Appointment with Liliane Channel for custom molded orthotics. 5. Instructed patient regarding therapies and modalities at home to alleviate symptoms.  6. Return to clinic when necessary.  Edrick Kins, DPM [facility]  Dr. Edrick Kins, DPM    [address], [place] [address]                Office [phone]  Fax [phone]

## 2017-06-27 NOTE — Progress Notes (Signed)
Follow-up Note  Referring Provider: Burnard Bunting, MD Primary Provider: Burnard Bunting, MD Date of Office Visit: 06/27/2017  Subjective:   Jared [name] (DOB: 06-Mar-1951) is a 66 y.o. male who returns to the [facility] on 06/27/2017 in re-evaluation of the following:  HPI: Thaddaeus presents to this clinic in reevaluation of his severe asthma treated with omalizumab, allergic rhinitis, and reflux-induced respiratory disease. He was last seen in this clinic by Dr. Nelva Bush 01/27/2017 with an asthma exacerbation requiring extensive systemic steroid use.  He did well for a while but unfortunately over the course of the past month or so he has redeveloped wheezing and coughing and using a bronchodilator multiple times a day even in the face of continuing on a very large collection of anti-inflammatory medications for his respiratory tract. He also has some nasal congestion and sneezing in association with this issue. He does not have any anosmia or ugly nasal discharge or headaches or chest pain or ugly sputum production or fevers. There is no obvious provoking factor for why he has had loss of asthma control.  His reflux is under good control on his current therapy.  Allergies as of 06/27/2017      Reactions   Contrast Media [iodinated Diagnostic Agents] Other (See Comments)   Was told not to take d/t pt only having 1 kidney   Nsaids Other (See Comments)   Was told not to take d/t pt only having 1 kidney   Penicillins Hives, Itching   Sulfonamide Derivatives Hives, Itching   Codeine Nausea Only   Statins Other (See Comments)   Myalgias      Medication List      AEROCHAMBER PLUS inhaler Use as instructed   ARIPiprazole 15 MG tablet Commonly known as:  ABILIFY Take 15 mg by mouth daily.   Armodafinil 250 MG tablet   aspirin 325 MG tablet Take 325 mg by mouth daily.   beclomethasone 80 MCG/ACT inhaler Commonly known as:  QVAR REDIHALER Inhale 2 puffs  into the lungs 2 (two) times daily.   BELSOMRA 20 MG Tabs Generic drug:  Suvorexant   DULoxetine 60 MG capsule Commonly known as:  CYMBALTA Take 60 mg by mouth daily.   ezetimibe 10 MG tablet Commonly known as:  ZETIA Take 1 tablet (10 mg total) by mouth daily. <PLEASE MAKE APPOINTMENT FOR REFILLS>   finasteride 5 MG tablet Commonly known as:  PROSCAR Take 5 mg by mouth at bedtime.   fish oil-omega-3 fatty acids 1000 MG capsule Take 1 g by mouth daily.   FLOVENT HFA 110 MCG/ACT inhaler Generic drug:  fluticasone inhale 2 puffs INTO LUNGS twice a day   gemfibrozil 600 MG tablet Commonly known as:  LOPID Take 600 mg by mouth 2 (two) times daily.   ipratropium-albuterol 0.5-2.5 (3) MG/3ML Soln Commonly known as:  DUONEB Take 3 mLs by nebulization every 6 (six) hours as needed.   isosorbide mononitrate 30 MG 24 hr tablet Commonly known as:  IMDUR take 1 tablet by mouth once daily   levothyroxine 25 MCG tablet Commonly known as:  SYNTHROID, LEVOTHROID Take 25 mcg by mouth daily before breakfast.   mometasone-formoterol 200-5 MCG/ACT Aero Commonly known as:  DULERA Inhale 2 puffs into the lungs 2 (two) times daily.   montelukast 10 MG tablet Commonly known as:  SINGULAIR Take 1 tablet (10 mg total) by mouth at bedtime.   multivitamin tablet Take 1 tablet by mouth daily.   nitroGLYCERIN 0.4  MG SL tablet Commonly known as:  NITROSTAT Place 1 tablet (0.4 mg total) under the tongue every 5 (five) minutes as needed for chest pain.   omalizumab 150 MG injection Commonly known as:  XOLAIR Inject 150 mg into the skin every 28 (twenty-eight) days.   omeprazole 20 MG capsule Commonly known as:  PRILOSEC Take 1 capsule (20 mg total) by mouth daily.   PROAIR HFA 108 (90 Base) MCG/ACT inhaler Generic drug:  albuterol inhale 2 puffs by mouth every 4 to 6 hours if needed for cough or wheezing   albuterol (2.5 MG/3ML) 0.083% nebulizer solution Commonly known as:   PROVENTIL inhale contents of 1 vial in nebulizer every 4 hours if needed for cough OR WHEEZING   RAPAFLO 8 MG Caps capsule Generic drug:  silodosin Take 8 mg by mouth at bedtime.   vitamin C 500 MG tablet Commonly known as:  ASCORBIC ACID Take 500 mg by mouth 2 (two) times daily.       Past Medical History:  Diagnosis Date  . Cervical vertebral fusion 05/02/2014   Now presenting with C5-6 radiculopathy  . Coronary artery disease   . Depression   . Depression, prolonged 06/26/2013  . Diverticulosis   . GERD (gastroesophageal reflux disease)   . Heart attack ([place]) 2009  . History of esophageal stricture   . Hyperlipidemia   . Hypertension   . IBS (irritable bowel syndrome)   . Internal hemorrhoids   . Osteoarthritis   . 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 Peripheral neuropathy   . Renal cell carcinoma 1997   Left kidney  . Shoulder pain, right   . Sleep talking   . Snoring 12/25/2013    Past Surgical History:  Procedure Laterality Date  . ANTERIOR CERVICAL DECOMP/DISCECTOMY FUSION N/A 08/27/2014   Procedure: Cervical six-seven anterior cervical decompression fusion with removal of hardware at Cervical five-six;  Surgeon: Erline Levine, MD;  Location: [facility];  Service: Neurosurgery;  Laterality: N/A;  Cervical six-seven anterior cervical decompression fusion with removal of hardware at Cervical five-six  . BACK SURGERY  1992, 2013  . CARDIAC CATHETERIZATION  05/10/2007   Minimal CAD, normal LV systolic function, medical management  . CARDIAC CATHETERIZATION  04/08/2008   RCA ulcerated 70-80% stenosis, stented with a 3x31mm Endeavor stent at 13atm for 50sec, reduced from 80% ulcerated stenosis to 0%.  . CARDIAC CATHETERIZATION  05/07/2009   50% distal left main disease-IVUS or flow wire too dangerous in particular setting to perform intervention.  Marland Kitchen CARDIAC CATHETERIZATION  03/18/2010   Medical management  . CARDIOVASCULAR STRESS TEST  02/09/2012   Normal, no  significant wall abnormalities noted  . CARPAL TUNNEL RELEASE Bilateral   . CORONARY STENT PLACEMENT    . FEMUR FRACTURE SURGERY Left 1995  . NECK SURGERY  2005  . NEPHRECTOMY Left    secondary to cancer  . TOTAL KNEE ARTHROPLASTY Left 2005    Review of systems negative except as noted in HPI / PMHx or noted below:  Review of Systems  Constitutional: Negative.   HENT: Negative.   Eyes: Negative.   Respiratory: Negative.   Cardiovascular: Negative.   Gastrointestinal: Negative.   Genitourinary: Negative.   Musculoskeletal: Negative.   Skin: Negative.   Neurological: Negative.   Endo/Heme/Allergies: Negative.   Psychiatric/Behavioral: Negative.      Objective:   Vitals:   06/27/17 1127  BP: (!) 146/92  Pulse: 84  Resp: 18  SpO2: 93%  Physical Exam  Constitutional: He is well-developed, well-nourished, and in no distress.  HENT:  Head: Normocephalic.  Right Ear: Tympanic membrane, external ear and ear canal normal.  Left Ear: Tympanic membrane, external ear and ear canal normal.  Nose: Nose normal. No mucosal edema or rhinorrhea.  Mouth/Throat: Uvula is midline, oropharynx is clear and moist and mucous membranes are normal. No oropharyngeal exudate.  Eyes: Conjunctivae are normal.  Neck: Trachea normal. No tracheal tenderness present. No tracheal deviation present. No thyromegaly present.  Cardiovascular: Normal rate, regular rhythm, S1 normal, S2 normal and normal heart sounds.   No murmur heard. Pulmonary/Chest: No stridor. No respiratory distress. He has wheezes (bilateral inspiratory and expiratory wheezes throughout all lung fields). He has no rales.  Musculoskeletal: He exhibits no edema.  Lymphadenopathy:       Head (right side): No tonsillar adenopathy present.       Head (left side): No tonsillar adenopathy present.    He has no cervical adenopathy.  Neurological: He is alert. Gait normal.  Skin: No rash noted. He is not diaphoretic. No  erythema. Nails show no clubbing.  Psychiatric: Mood and affect normal.    Diagnostics:    Spirometry was performed and demonstrated an FEV1 of 1.57 at 55 % of predicted.  Review blood test dated 03/17/2010 refers to an absolute eosinophil count of 400.  Assessment and Plan:   1. Severe persistent asthma with acute exacerbation   2. Other allergic rhinitis   3. LPRD (laryngopharyngeal reflux disease)   4. Eosinophilia     1. Depo-Medrol 80 IM delivered in clinic today  2. DuoNeb delivered in clinic today  3. Use a combination of the following:   A. Dulera 200 - 2 inhalations twice a day  B. FLOVENT 110 - 2 inhalations twice a day  C. montelukast 10 mg - one tablet one time per day  D. OTC Rhinocort - one spray each nostril once a day  E. omeprazole 20 mg - one tablet twice a day  4. Prednisone 20 mg a day for 5 days then 15 mg a day for 5 days then 10 mg a day for 5 days then 5 mg a day for 5 days  5. Continue DuoNeb or ProAir HFA if needed  6. Continue Xolair and EpiPen  7. Continue nasal saline and antihistamine if needed  8. Return to clinic in 20 days or earlier if problem  9. Obtain CBC w/diff, ANCA w/R today. Change from Xolair to different agent?   Erdem has once again developed significant inflammation of his respiratory tract. He has had 2 very significant exacerbations within the past 5 months and it does not appear as though his Xolair administration appears to be working very well in controlling his disease state. Given the fact that he has eosinophilia documented in the past we will see if he is a candidate for a anti-IL-5 biological agent to replace omalizumab. He will utilize systemic steroids today to help with his inflammation and continue on a large collection of medical therapy to address inflammation and reflux as noted above.  Allena Katz, MD [facility]

## 2017-06-27 NOTE — Patient Instructions (Addendum)
  1. Depo-Medrol 80 IM delivered in clinic today  2. DuoNeb delivered in clinic today  3. Use a combination of the following:   A. Dulera 200 - 2 inhalations twice a day  B. FLOVENT 110 - 2 inhalations twice a day  C. montelukast 10 mg - one tablet one time per day  D. OTC Rhinocort - one spray each nostril once a day  E. omeprazole 20 mg - one tablet twice a day  4. Prednisone 20 mg a day for 5 days then 15 mg a day for 5 days then 10 mg a day for 5 days then 5 mg a day for 5 days  5. Continue DuoNeb or ProAir HFA if needed  6. Continue Xolair and EpiPen  7. Continue nasal saline and antihistamine if needed  8. Return to clinic in 20 days or earlier if problem  9. Obtain CBC w/diff, ANCA w/reflex today. Change from Xolair to different agent?

## 2017-06-28 ENCOUNTER — Other Ambulatory Visit: Payer: Self-pay | Admitting: Podiatry

## 2017-06-28 DIAGNOSIS — M722 Plantar fascial fibromatosis: Secondary | ICD-10-CM

## 2017-06-28 LAB — CBC WITH DIFFERENTIAL/PLATELET
Basophils Absolute: 0 10*3/uL (ref 0.0–0.2)
Basos: 0 %
EOS (ABSOLUTE): 0.1 10*3/uL (ref 0.0–0.4)
Eos: 0 %
Hematocrit: 46.7 % (ref 37.5–51.0)
Hemoglobin: 15.3 g/dL (ref 13.0–17.7)
Immature Grans (Abs): 0.2 10*3/uL — ABNORMAL HIGH (ref 0.0–0.1)
Immature Granulocytes: 1 %
Lymphocytes Absolute: 1.6 10*3/uL (ref 0.7–3.1)
Lymphs: 10 %
MCH: 29 pg (ref 26.6–33.0)
MCHC: 32.8 g/dL (ref 31.5–35.7)
MCV: 88 fL (ref 79–97)
Monocytes Absolute: 0.7 10*3/uL (ref 0.1–0.9)
Monocytes: 5 %
Neutrophils Absolute: 13.1 10*3/uL — ABNORMAL HIGH (ref 1.4–7.0)
Neutrophils: 84 %
Platelets: 347 10*3/uL (ref 150–379)
RBC: 5.28 x10E6/uL (ref 4.14–5.80)
RDW: 14.2 % (ref 12.3–15.4)
WBC: 15.6 10*3/uL — ABNORMAL HIGH (ref 3.4–10.8)

## 2017-06-28 LAB — PAN-ANCA
ANCA Proteinase 3: <3.5 U/mL (ref 0.0–3.5)
Atypical pANCA: <1:20 {titer}
C-ANCA: <1:20 {titer}
Myeloperoxidase Ab: <9 U/mL (ref 0.0–9.0)
P-ANCA: <1:20 {titer}

## 2017-07-03 ENCOUNTER — Other Ambulatory Visit: Payer: Self-pay | Admitting: Cardiovascular Disease

## 2017-07-04 ENCOUNTER — Ambulatory Visit (INDEPENDENT_AMBULATORY_CARE_PROVIDER_SITE_OTHER): Payer: Medicare Other | Admitting: Orthotics

## 2017-07-04 DIAGNOSIS — M722 Plantar fascial fibromatosis: Secondary | ICD-10-CM

## 2017-07-06 NOTE — Progress Notes (Signed)

## 2017-07-10 ENCOUNTER — Ambulatory Visit (INDEPENDENT_AMBULATORY_CARE_PROVIDER_SITE_OTHER): Payer: Medicare Other

## 2017-07-10 DIAGNOSIS — J4550 Severe persistent asthma, uncomplicated: Secondary | ICD-10-CM

## 2017-07-10 DIAGNOSIS — J449 Chronic obstructive pulmonary disease, unspecified: Secondary | ICD-10-CM | POA: Insufficient documentation

## 2017-07-13 ENCOUNTER — Other Ambulatory Visit: Payer: Self-pay | Admitting: Allergy

## 2017-07-13 DIAGNOSIS — J4551 Severe persistent asthma with (acute) exacerbation: Secondary | ICD-10-CM

## 2017-07-13 MED ORDER — MONTELUKAST SODIUM 10 MG PO TABS: 10.0000 mg | ORAL_TABLET | Freq: Every day | ORAL | 5 refills | Status: DC

## 2017-07-13 NOTE — Telephone Encounter (Signed)
Received fax for refill for montelukast. Patient was last seen 06/27/2017. Refill sent in.

## 2017-07-14 ENCOUNTER — Encounter: Payer: Self-pay | Admitting: Cardiovascular Disease

## 2017-07-14 ENCOUNTER — Ambulatory Visit (INDEPENDENT_AMBULATORY_CARE_PROVIDER_SITE_OTHER): Payer: Medicare Other | Admitting: Cardiovascular Disease

## 2017-07-14 ENCOUNTER — Ambulatory Visit: Payer: Self-pay

## 2017-07-14 VITALS — BP 130/76 | HR 70 | Ht 66.0 in | Wt 194.2 lb

## 2017-07-14 DIAGNOSIS — I2510 Atherosclerotic heart disease of native coronary artery without angina pectoris: Secondary | ICD-10-CM | POA: Diagnosis not present

## 2017-07-14 DIAGNOSIS — I10 Essential (primary) hypertension: Secondary | ICD-10-CM

## 2017-07-14 NOTE — Assessment & Plan Note (Signed)
History of essential hypertension blood pressure initially 130/76. He is not on antihypertensive medications.

## 2017-07-14 NOTE — Patient Instructions (Signed)
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
## 2017-07-14 NOTE — Assessment & Plan Note (Addendum)
History of hyperlipidemia on Zetia followed by his PCP 

## 2017-07-14 NOTE — Assessment & Plan Note (Signed)
History of CAD status post RCA stenting by Dr. Rex Kras in 2000. He had a repeat catheterization in 2011 revealing a widely patent stent with otherwise minimal CAD and normal LV function. He's been asymptomatic since.

## 2017-07-22 ENCOUNTER — Other Ambulatory Visit: Payer: Self-pay | Admitting: Allergy and Immunology

## 2017-07-25 ENCOUNTER — Ambulatory Visit: Payer: Medicare Other | Admitting: Orthotics

## 2017-07-25 DIAGNOSIS — M722 Plantar fascial fibromatosis: Secondary | ICD-10-CM

## 2017-07-25 NOTE — Progress Notes (Signed)
Need to have f/o redone to dress...they will not go into his loafers.

## 2017-08-07 ENCOUNTER — Ambulatory Visit (INDEPENDENT_AMBULATORY_CARE_PROVIDER_SITE_OTHER): Payer: Medicare Other | Admitting: *Deleted

## 2017-08-07 DIAGNOSIS — J4550 Severe persistent asthma, uncomplicated: Secondary | ICD-10-CM | POA: Diagnosis not present

## 2017-08-08 ENCOUNTER — Encounter: Payer: Medicare Other | Admitting: Orthotics

## 2017-08-17 ENCOUNTER — Ambulatory Visit (INDEPENDENT_AMBULATORY_CARE_PROVIDER_SITE_OTHER): Payer: Medicare Other | Admitting: Orthotics

## 2017-08-17 DIAGNOSIS — M722 Plantar fascial fibromatosis: Secondary | ICD-10-CM

## 2017-09-04 ENCOUNTER — Ambulatory Visit (INDEPENDENT_AMBULATORY_CARE_PROVIDER_SITE_OTHER): Payer: Medicare Other | Admitting: *Deleted

## 2017-09-04 DIAGNOSIS — J4550 Severe persistent asthma, uncomplicated: Secondary | ICD-10-CM | POA: Diagnosis not present

## 2017-09-04 NOTE — Progress Notes (Signed)
Patient came in to pick up corrected f/o.

## 2017-10-02 ENCOUNTER — Ambulatory Visit (INDEPENDENT_AMBULATORY_CARE_PROVIDER_SITE_OTHER): Payer: Medicare Other | Admitting: *Deleted

## 2017-10-02 DIAGNOSIS — J4550 Severe persistent asthma, uncomplicated: Secondary | ICD-10-CM

## 2017-10-03 DIAGNOSIS — M1710 Unilateral primary osteoarthritis, unspecified knee: Secondary | ICD-10-CM | POA: Insufficient documentation

## 2017-10-03 DIAGNOSIS — M179 Osteoarthritis of knee, unspecified: Secondary | ICD-10-CM | POA: Insufficient documentation

## 2017-10-30 ENCOUNTER — Ambulatory Visit: Payer: Self-pay

## 2017-11-16 ENCOUNTER — Ambulatory Visit (INDEPENDENT_AMBULATORY_CARE_PROVIDER_SITE_OTHER): Payer: Medicare Other | Admitting: *Deleted

## 2017-11-16 DIAGNOSIS — J4550 Severe persistent asthma, uncomplicated: Secondary | ICD-10-CM | POA: Diagnosis not present

## 2017-11-17 ENCOUNTER — Telehealth: Payer: Self-pay

## 2017-11-17 NOTE — Telephone Encounter (Signed)
   [facility] Pre-operative Risk Assessment    Request for surgical clearance:  1. What type of surgery is being performed? RIGHT KNEE ARTHROSCOPY   2. When is this surgery scheduled? TBD   3. What type of clearance is required (medical clearance vs. Pharmacy clearance to hold med vs. Both)? BOTH  4. Are there any medications that need to be held prior to surgery and how long?ASA   5. Practice name and name of physician performing surgery? [place] NORRIS,MD HYWV:PXTGGY IRSWN   4. What is your office phone and fax number? [phone] FX [phone]   7. Anesthesia type (None, local, MAC, general) ? LOCAL   Waylan Rocher 11/17/2017, 5:12 PM  _________________________________________________________________   (provider comments below)

## 2017-11-20 NOTE — Telephone Encounter (Signed)
   Primary Cardiologist: Quay Burow, MD  Chart reviewed as part of pre-operative protocol coverage. Given past medical history and time since last visit, based on ACC/AHA guidelines, Jared [name] would be at acceptable risk for the planned procedure without further cardiovascular testing. He is able to get > 4 mets of activity easily.   Dr. Gwenlyn Found, is it ok to hold ASA 325mg  and how long?  Please forward your response to P CV DIV PREOP. Thank you   Leanor Kail, PA 11/20/2017, 3:02 PM

## 2017-11-20 NOTE — Telephone Encounter (Signed)
OK to interrupt ASA.

## 2017-12-12 DIAGNOSIS — S83249A Other tear of medial meniscus, current injury, unspecified knee, initial encounter: Secondary | ICD-10-CM | POA: Insufficient documentation

## 2017-12-14 ENCOUNTER — Ambulatory Visit (INDEPENDENT_AMBULATORY_CARE_PROVIDER_SITE_OTHER): Payer: Medicare Other | Admitting: *Deleted

## 2017-12-14 DIAGNOSIS — J4550 Severe persistent asthma, uncomplicated: Secondary | ICD-10-CM

## 2017-12-21 ENCOUNTER — Ambulatory Visit: Payer: Medicare Other | Admitting: Allergy & Immunology

## 2017-12-21 ENCOUNTER — Encounter: Payer: Self-pay | Admitting: Allergy & Immunology

## 2017-12-21 VITALS — BP 120/80 | HR 84 | Temp 98.2°F | Resp 18 | Ht 64.57 in | Wt 194.8 lb

## 2017-12-21 DIAGNOSIS — K219 Gastro-esophageal reflux disease without esophagitis: Secondary | ICD-10-CM | POA: Diagnosis not present

## 2017-12-21 DIAGNOSIS — J3089 Other allergic rhinitis: Secondary | ICD-10-CM | POA: Insufficient documentation

## 2017-12-21 DIAGNOSIS — J4550 Severe persistent asthma, uncomplicated: Secondary | ICD-10-CM | POA: Insufficient documentation

## 2017-12-21 DIAGNOSIS — J4551 Severe persistent asthma with (acute) exacerbation: Secondary | ICD-10-CM | POA: Diagnosis not present

## 2017-12-21 DIAGNOSIS — J309 Allergic rhinitis, unspecified: Secondary | ICD-10-CM | POA: Insufficient documentation

## 2017-12-21 MED ORDER — METHYLPREDNISOLONE ACETATE 40 MG/ML IJ SUSP
40.0000 mg | Freq: Once | INTRAMUSCULAR | Status: AC
  Administered 2017-12-21: 40 mg via INTRAMUSCULAR

## 2017-12-21 NOTE — Patient Instructions (Addendum)
1. Severe persistent asthma with acute exacerbation - Lung testing looked fairly normal today, but it did improve with the albuterol treatment. - You were wheezing on exam today, but it did improve with the albuterol nebulizer treatment. - DepoMedrol 40mg  given in clinic today. - Start a prednisone burst: 20mg  twice daily for five days, 20mg  daily for five days, 10mg  daily for fives days, then stop. - Samples of Breo provided: take one puff once daily (instead of the [employer] and Flovent) - Daily controller medication(s): Singulair 10mg  daily and Breo 200/55mcg one puff once daily and Xolair monthly - Prior to physical activity: ProAir 2 puffs 10-15 minutes before physical activity. - Rescue medications: ProAir 4 puffs every 4-6 hours as needed (you may need to continue this for 3-5 days during period of flares) - Asthma control goals:  * Full participation in all desired activities (may need albuterol before activity) * Albuterol use two time or less a week on average (not counting use with activity) * Cough interfering with sleep two time or less a month * Oral steroids no more than once a year * No hospitalizations  2. Allergic rhinitis - Continue with nasal saline rinses. - Continue with Rhinocort 1-2 sprays per nostril daily. - Continue with antihistamines as needed.   3. LPRD (laryngopharyngeal reflux disease) - Continue with the use of omeprazole 20mg  once daily.  4. Return in about 3 months (around 03/22/2018).   Please inform us of any Emergency Department visits, hospitalizations, or changes in symptoms. Call us before going to the ED for breathing or allergy symptoms since we might be able to fit you in for a sick visit. Feel free to contact us anytime with any questions, problems, or concerns.  It was a pleasure to meet you today!  Websites that have reliable patient information: 1. American Academy of Asthma, Allergy, and Immunology: www.aaaai.org 2. Food Allergy Research  and Education (FARE): foodallergy.org 3. Mothers of Asthmatics: http://www.asthmacommunitynetwork.org 4. American College of Allergy, Asthma, and Immunology: www.acaai.org

## 2017-12-21 NOTE — Progress Notes (Signed)
FOLLOW UP  Date of Service/Encounter:  12/21/17   Assessment:   Severe persistent asthma with acute exacerbation - on Xolair for years with continued intermittent prednisone bursts likely secondary to poor compliance   Allergic rhinitis  LPRD (laryngopharyngeal reflux disease)   Asthma Reportables:  Severity: severe persistent  Risk: high due to non-compliance with controller medications Control: not well controlled  Plan/Recommendations:   1. Severe persistent asthma with acute exacerbation - Lung testing looked fairly normal today, but it did improve with the albuterol treatment. - You were wheezing on exam today, but it did improve with the albuterol nebulizer treatment. - DepoMedrol 40mg  given in clinic today. - Start a prednisone burst: 20mg  twice daily for five days, 20mg  daily for five days, 10mg  daily for fives days, then stop. - Samples of Breo provided: take one puff once daily (instead of the [facility] and Flovent)  - I am optimistic that the decrease in dosing frequency will help with the overall compliance.  - Daily controller medication(s): Singulair 10mg  daily and Breo 200/70mcg one puff once daily and Xolair monthly - Prior to physical activity: ProAir 2 puffs 10-15 minutes before physical activity. - Rescue medications: ProAir 4 puffs every 4-6 hours as needed (you may need to continue this for 3-5 days during period of flares) - Asthma control goals:  * Full participation in all desired activities (may need albuterol before activity) * Albuterol use two time or less a week on average (not counting use with activity) * Cough interfering with sleep two time or less a month * Oral steroids no more than once a year * No hospitalizations  2. Allergic rhinitis - Continue with nasal saline rinses. - Continue with Rhinocort 1-2 sprays per nostril daily. - Continue with antihistamines as needed.   3. LPRD (laryngopharyngeal reflux disease) - Continue with the use  of omeprazole 20mg  once daily.  4. Return in about 3 months (around 03/22/2018).  Subjective:   Jared [name] is a 67 y.o. male presenting today for follow up of  Chief Complaint  Patient presents with  . Cough  . Asthma    "Asthma Flare within the last 4 days."    Jared [name] has a history of the following: Patient Active Problem List   Diagnosis Date Noted  . Severe persistent asthma with acute exacerbation 12/21/2017  . Other allergic rhinitis 12/21/2017  . LPRD (laryngopharyngeal reflux disease) 12/21/2017  . Allergic rhinoconjunctivitis 05/21/2015  . C7 cervical fracture ([place]) 08/27/2014  . Cervical spine fracture ([place]) 08/23/2014  . Abnormal brain MRI 07/23/2014  . 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 Cervical vertebral fusion 05/02/2014  . Snoring 12/25/2013  . 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 Insomnia due to mental condition 12/25/2013  . Sleep talking   . Essential hypertension 07/08/2013  . Depression, prolonged 06/26/2013  . Night sweats 06/26/2013  . Chest pain at rest 06/25/2013  . Bronchiectasis without acute exacerbation ([place]) 01/16/2008  . PULMONARY NODULE 12/20/2007  . HYPERNEPHROMA 11/27/2007  . DYSLIPIDEMIA 11/27/2007  . Coronary atherosclerosis 11/27/2007  . HEMORRHOIDS, INTERNAL 11/27/2007  . Moderate persistent asthma 11/27/2007  . ESOPHAGITIS 11/27/2007  . ESOPHAGEAL STRICTURE 11/27/2007  . GERD 11/27/2007  . GASTRITIS, ACUTE 11/27/2007  . DIVERTICULOSIS, COLON 11/27/2007  . ABDOMINAL PAIN, LEFT UPPER QUADRANT 11/27/2007  . HIP FRACTURE, LEFT 11/27/2007  . FRACTURE, TIBIA 11/27/2007  . BENIGN PROSTATIC HYPERTROPHY, HX OF 11/27/2007    History obtained from: chart review and patient.  Jared [name]  [name]'s Primary Care Provider is Burnard Bunting, MD.     Jared [name] is a 67 y.o. male presenting for a follow up visit. He was last seen in October 2018 by Dr. Neldon Mc. He has been followed by Dr. Neldon Mc for several years for  severe persistent asthma. At that time, he was experiencing one month of worsening asthma control with increased use of the albuterol. At that time, he was given DepoMedrol 80mg  and put on a prednisone taper as well. He was continued on Dulera 200/5 two puffs BID, Flovent 1101mcg two puffs BID, montelukast 10mg  daily, Rhinocort one spray per nostril daily, and omeprazole.   Since the last visit, he has mostly done well. He has been compliant with the Xolair, but he rest of his medications are another story. He does not use Dulera or the Flovent until he starts to have problems with his breathing. He is more compliant with the Singulair, however. He has had no steroids since October 2018 when he saw Dr. Neldon Mc.   Jared [name] tells me that his currently set of symptoms started last night for the past two weeks. He reports wheezing throughout the night. He has been using his rescue inhaler very frequently, up to 2-3 times per day. He denies fevers, chest pain, or copious mucous production. He denies sinus pressure or discharge. He did restart the [facility] [employer] and the Flovent a couple of weeks ago with some improvement in his symptoms.   GERD controlled with the use of omeprazole daily. He does not get routine endoscopies. Allergic rhinitis is fairly well controlled with the use of Rhinocort 1-2 sprays per nostril daily.   Otherwise, there have been no changes to his past medical history, surgical history, family history, or social history.    Review of Systems: a 14-point review of systems is pertinent for what is mentioned in HPI.  Otherwise, all other systems were negative. Constitutional: negative other than that listed in the HPI Eyes: negative other than that listed in the HPI Ears, nose, mouth, throat, and face: negative other than that listed in the HPI Respiratory: negative other than that listed in the HPI Cardiovascular: negative other than that listed in the HPI Gastrointestinal: negative other  than that listed in the HPI Genitourinary: negative other than that listed in the HPI Integument: negative other than that listed in the HPI Hematologic: negative other than that listed in the HPI Musculoskeletal: negative other than that listed in the HPI Neurological: negative other than that listed in the HPI Allergy/Immunologic: negative other than that listed in the HPI    Objective:   Blood pressure 120/80, pulse 84, temperature 98.2 F (36.8 C), resp. rate 18, height 5' 4.57" (1.64 m), weight 194 lb 12.8 oz (88.4 kg), SpO2 95 %. Body mass index is 32.85 kg/m.   Physical Exam:  General: Alert, interactive, in no acute distress. Soft spoken.  Eyes: No conjunctival injection bilaterally, no discharge on the right, no discharge on the left and no Horner-Trantas dots present. PERRL bilaterally. EOMI without pain. No photophobia.  Ears: Right TM pearly gray with normal light reflex, Left TM pearly gray with normal light reflex, Right TM intact without perforation and Left TM intact without perforation.  Nose/Throat: External nose within normal limits and septum midline. Turbinates edematous without discharge. Posterior oropharynx erythematous without cobblestoning in the posterior oropharynx. Tonsils 2+ without exudates.  Tongue without thrush and Geographic tongue present. Lungs: Decreased breath sounds with expiratory wheezing bilaterally in the upper lung fields. No  increased work of breathing. CV: Normal S1/S2. No murmurs. Capillary refill <2 seconds.   Skin: Warm and dry, without lesions or rashes. Neuro:   Grossly intact. No focal deficits appreciated. Responsive to questions.  Diagnostic studies:   Spirometry: results normal (FEV1: 2.42/89%, FVC: 3.08/84%, FEV1/FVC: 79%).    Spirometry consistent with normal pattern. Albuterol/Atrovent nebulizer treatment given in clinic with improvement in FEV1 and FVC, but not significant per ATS criteria.  DepoMedrol injection given in  clinic today.   Allergy Studies: none    Jared Marvel, MD [facility]

## 2017-12-27 ENCOUNTER — Other Ambulatory Visit: Payer: Self-pay | Admitting: Allergy and Immunology

## 2017-12-27 DIAGNOSIS — K219 Gastro-esophageal reflux disease without esophagitis: Secondary | ICD-10-CM

## 2017-12-29 ENCOUNTER — Other Ambulatory Visit: Payer: Self-pay

## 2017-12-29 DIAGNOSIS — J4551 Severe persistent asthma with (acute) exacerbation: Secondary | ICD-10-CM

## 2017-12-29 MED ORDER — MONTELUKAST SODIUM 10 MG PO TABS: 10.0000 mg | ORAL_TABLET | Freq: Every day | ORAL | 5 refills | Status: DC

## 2018-01-10 ENCOUNTER — Ambulatory Visit (INDEPENDENT_AMBULATORY_CARE_PROVIDER_SITE_OTHER): Payer: Medicare Other | Admitting: *Deleted

## 2018-01-10 DIAGNOSIS — J4540 Moderate persistent asthma, uncomplicated: Secondary | ICD-10-CM | POA: Diagnosis not present

## 2018-01-27 ENCOUNTER — Other Ambulatory Visit: Payer: Self-pay | Admitting: Allergy and Immunology

## 2018-01-27 DIAGNOSIS — K219 Gastro-esophageal reflux disease without esophagitis: Secondary | ICD-10-CM

## 2018-02-07 ENCOUNTER — Ambulatory Visit (INDEPENDENT_AMBULATORY_CARE_PROVIDER_SITE_OTHER): Payer: Medicare Other | Admitting: *Deleted

## 2018-02-07 DIAGNOSIS — J4540 Moderate persistent asthma, uncomplicated: Secondary | ICD-10-CM | POA: Diagnosis not present

## 2018-03-02 NOTE — Telephone Encounter (Signed)
Not sure why note sent back to preop pool.  Reviewed chart.  Patient was cleared for surgery in March 2019.  He underwent knee arthroscopy in May 2019.  Note will be closed and removed from preop pool. Richardson Dopp, PA-C    03/02/2018 10:30 AM

## 2018-03-07 ENCOUNTER — Ambulatory Visit (INDEPENDENT_AMBULATORY_CARE_PROVIDER_SITE_OTHER): Payer: Medicare Other | Admitting: *Deleted

## 2018-03-07 DIAGNOSIS — J4540 Moderate persistent asthma, uncomplicated: Secondary | ICD-10-CM

## 2018-03-15 DIAGNOSIS — M84353A Stress fracture, unspecified femur, initial encounter for fracture: Secondary | ICD-10-CM | POA: Insufficient documentation

## 2018-03-30 ENCOUNTER — Telehealth: Payer: Self-pay | Admitting: Cardiovascular Disease

## 2018-03-30 NOTE — Telephone Encounter (Signed)
   [facility] Pre-operative Risk Assessment    Request for surgical clearance:  1. What type of surgery is being performed? Right TKA   2. When is this surgery scheduled? April 13 2018   3. What type of clearance is required (medical clearance vs. Pharmacy clearance to hold med vs. Both)? Both   4. Are there any medications that need to be held prior to surgery and how long? Aspirin    5. Practice name and name of physician performing surgery? Dr. Veverly Fells @ [facility]   6. What is your office phone number [phone]    7.   What is your office fax number [phone] Attn: Santiago Bur  8.   Anesthesia type (None, local, MAC, general) ? General/spinal   Jared [name] 03/30/2018, 2:30 PM  _________________________________________________________________   (provider comments below)

## 2018-04-02 NOTE — Telephone Encounter (Signed)
   Primary Cardiologist:Jonathan Gwenlyn Found, MD  Chart reviewed as part of pre-operative protocol coverage. Because of Jared [name]'s past medical history and time since last visit, he/she will require a follow-up visit in order to better assess preoperative cardiovascular risk.  Pre-op covering staff: - Please schedule appointment and call patient to inform them. - Please contact requesting surgeon's office via preferred method (i.e, phone, fax) to inform them of need for appointment prior to surgery.  Truitt Merle, NP  04/02/2018, 1:17 PM

## 2018-04-02 NOTE — Telephone Encounter (Signed)
S/w pt is aware of recommendation's and is scheduled appointment for Jared Sims, NP, for July 22 @ 2:00pm. Pt is having surgery July 26.

## 2018-04-04 ENCOUNTER — Encounter (HOSPITAL_COMMUNITY): Payer: Self-pay

## 2018-04-04 ENCOUNTER — Ambulatory Visit (INDEPENDENT_AMBULATORY_CARE_PROVIDER_SITE_OTHER): Payer: Medicare Other

## 2018-04-04 ENCOUNTER — Other Ambulatory Visit: Payer: Self-pay

## 2018-04-04 ENCOUNTER — Encounter (HOSPITAL_COMMUNITY)
Admission: RE | Admit: 2018-04-04 | Discharge: 2018-04-04 | Disposition: A | Discharge status: Home / Self Care | Payer: Medicare Other | Source: Ambulatory Visit | Attending: Orthopedic Surgery | Admitting: Orthopedic Surgery

## 2018-04-04 DIAGNOSIS — Z01812 Encounter for preprocedural laboratory examination: Secondary | ICD-10-CM | POA: Diagnosis not present

## 2018-04-04 DIAGNOSIS — J4540 Moderate persistent asthma, uncomplicated: Secondary | ICD-10-CM | POA: Diagnosis not present

## 2018-04-04 HISTORY — DX: Presence of spectacles and contact lenses: Z97.3

## 2018-04-04 HISTORY — DX: Sleep apnea, unspecified: G47.30

## 2018-04-04 LAB — CBC
HCT: 44.6 % (ref 39.0–52.0)
Hemoglobin: 14.1 g/dL (ref 13.0–17.0)
MCH: 27.2 pg (ref 26.0–34.0)
MCHC: 31.6 g/dL (ref 30.0–36.0)
MCV: 85.9 fL (ref 78.0–100.0)
Platelets: 374 10*3/uL (ref 150–400)
RBC: 5.19 MIL/uL (ref 4.22–5.81)
RDW: 14.2 % (ref 11.5–15.5)
WBC: 7.3 10*3/uL (ref 4.0–10.5)

## 2018-04-04 LAB — BASIC METABOLIC PANEL
Anion gap: 10 (ref 5–15)
BUN: 26 mg/dL — ABNORMAL HIGH (ref 8–23)
CO2: 23 mmol/L (ref 22–32)
Calcium: 9.5 mg/dL (ref 8.9–10.3)
Chloride: 111 mmol/L (ref 98–111)
Creatinine, Ser: 1.52 mg/dL — ABNORMAL HIGH (ref 0.61–1.24)
GFR calc Af Amer: 52 mL/min — ABNORMAL LOW (ref >60)
GFR calc non Af Amer: 45 mL/min — ABNORMAL LOW (ref >60)
Glucose, Bld: 113 mg/dL — ABNORMAL HIGH (ref 70–99)
Potassium: 4.2 mmol/L (ref 3.5–5.1)
Sodium: 144 mmol/L (ref 135–145)

## 2018-04-04 LAB — SURGICAL PCR SCREEN
MRSA, PCR: NEGATIVE
Staphylococcus aureus: NEGATIVE

## 2018-04-04 NOTE — Progress Notes (Signed)
Pt denies any acute cardiopulmonary issues. Pt under the care of Dr. Gwenlyn Found, Cardiology. Pt is scheduled to see cardiologist on 04/09/18 for cardiac clearance. Pt denies having an echo. Pt denies recent labs. Requested LOV note and chest x ray from pt PCP, Dr. [name] of [facility].Pt stated that he was instructed to stop taking Aspirin 5 days prior to surgery. Pt chart forwarded to anesthesia to review history and labs.

## 2018-04-04 NOTE — Pre-Procedure Instructions (Signed)
   Jared [name]  04/04/2018      [id number] - [place], [address] [place] [address] Phone: [phone] Fax: [place], [place] [address] Phone: [phone] Fax: [phone]  [id number] [place], [address] AT [address] [place] [address] Phone: [phone] Fax: [place], [place] - [address] [place] [address] Phone: [phone] Fax: [phone]    Your procedure is scheduled on Friday, April 13, 2018  Report to [facility] Admitting at 7:30 A.M.  Call this number if you have problems the morning of surgery:  [phone]   Remember: Follow your surgeon's instructions regarding Aspirin; if no pre-op instructions were provided, please notify your surgeon.   Do not eat or drink after midnight.  Take these medicines the morning of surgery with A SIP OF WATER : ARIPiprazole (ABILIFY),  DULoxetine (CYMBALTA),  ezetimibe (ZETIA),  finasteride (PROSCAR), gemfibrozil (LOPID),  levothyroxine (SYNTHROID),  omeprazole (PRILOSEC),   If needed: ipratropium-albuterol (DUONEB) for shortness of breath  or wheezing, FLOVENT HFA  Inhaler for shortness of breath, PROAIR HFA 108 (90 Base) MCG/ACT inhaler for cough or wheezing ( Bring inhalers in with you on day of surgery), nitroGLYCERIN (NITROSTAT) for chest pain  Stop taking vitamins, fish oil and herbal medications. Do not take any NSAIDs ie: Ibuprofen, Advil, Naproxen (Aleve), Motrin, BC and Goody Powder; stop Friday April 06, 2018.  Do not wear jewelry, make-up or nail polish.  Do not wear lotions, powders, or perfumes, or deodorant.  Do not shave 48 hours prior to surgery.  Men  may shave face and neck.  Do not bring valuables to the hospital.  [facility] is not responsible for any belongings or valuables.  Contacts, dentures or bridgework may not be worn into surgery.  Leave your suitcase in the car.  After surgery it may be brought to your room. Special instructions: Shower the night before surgegy and morning of surgery with CHG. Please read over the following fact sheets that you were given. Pain Booklet, Coughing and Deep Breathing, Total Joint Packet, MRSA Information and Surgical Site Infection Prevention

## 2018-04-05 NOTE — H&P (Signed)
Anticipated LOS equal to or greater than 2 midnights due to - Age 69 and older with one or more of the following:  - Obesity  - Expected need for hospital services (PT, OT, Nursing) required for safe  discharge  - Anticipated need for postoperative skilled nursing care or inpatient rehab     Jared [name] is an 67 y.o. male.    Chief Complaint: right knee pain  HPI: Pt is a 67 y.o. male complaining of right knee pain for multiple years. Pain had continually increased since the beginning. X-rays in the clinic show end-stage arthritic changes of the right knee. Pt has tried various conservative treatments which have failed to alleviate their symptoms, including injections and therapy. Various options are discussed with the patient. Risks, benefits and expectations were discussed with the patient. Patient understand the risks, benefits and expectations and wishes to proceed with surgery.   PCP:  Burnard Bunting, MD  D/C Plans: Home  PMH: Past Medical History:  Diagnosis Date  . Cervical vertebral fusion 05/02/2014   Now presenting with C5-6 radiculopathy  . Coronary artery disease   . Depression   . Depression, prolonged 06/26/2013  . Diverticulosis   . GERD (gastroesophageal reflux disease)   . Heart attack ([place]) 2009  . History of esophageal stricture   . Hyperlipidemia   . Hypertension   . IBS (irritable bowel syndrome)   . Internal hemorrhoids   . Osteoarthritis   . 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 Peripheral neuropathy   . Renal cell carcinoma 1997   Left kidney  . Shoulder pain, right   . Sleep apnea    wears CPAP  . Sleep talking   . Snoring 12/25/2013  . Wears glasses     PSH: Past Surgical History:  Procedure Laterality Date  . ANTERIOR CERVICAL DECOMP/DISCECTOMY FUSION N/A 08/27/2014   Procedure: Cervical six-seven anterior cervical decompression fusion with removal of hardware at Cervical five-six;  Surgeon: Erline Levine, MD;  Location: [facility];  Service: Neurosurgery;  Laterality: N/A;  Cervical six-seven anterior cervical decompression fusion with removal of hardware at Cervical five-six  . BACK SURGERY  1992, 2013  . CARDIAC CATHETERIZATION  05/10/2007   Minimal CAD, normal LV systolic function, medical management  . CARDIAC CATHETERIZATION  04/08/2008   RCA ulcerated 70-80% stenosis, stented with a 3x25m Endeavor stent at 13atm for 50sec, reduced from 80% ulcerated stenosis to 0%.  . CARDIAC CATHETERIZATION  05/07/2009   50% distal left main disease-IVUS or flow wire too dangerous in particular setting to perform intervention.  .Marland KitchenCARDIAC CATHETERIZATION  03/18/2010   Medical management  . CARDIOVASCULAR STRESS TEST  02/09/2012   Normal, no significant wall abnormalities noted  . CARPAL TUNNEL RELEASE Bilateral   . COLONOSCOPY W/ BIOPSIES AND POLYPECTOMY    . CORONARY STENT PLACEMENT    . FEMUR FRACTURE SURGERY Left 1995  . HERNIA REPAIR    . NECK SURGERY  2005  . NEPHRECTOMY Left    secondary to cancer  . TOTAL KNEE ARTHROPLASTY Left 2005    Social History:  reports that he has never smoked. He has never used smokeless tobacco. He reports that he does not drink alcohol or use drugs.  Allergies:  Allergies  Allergen Reactions  . 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 Nsaids Other (See Comments)    Was told not to  take d/t pt only having 1 kidney  . Penicillins Hives, Itching and Other (See Comments)    Has patient had a PCN reaction causing immediate rash, facial/tongue/throat swelling, SOB or lightheadedness with hypotension: Unknown Has patient had a PCN reaction causing severe rash involving mucus membranes or skin necrosis: Unknown Has patient had a PCN reaction that required hospitalization: Unknown Has patient had a PCN reaction occurring within the last 10 years: No If all of the above answers are "NO", then may proceed  with Cephalosporin use.   . Sulfonamide Derivatives Hives and Itching  . Codeine Nausea Only  . Statins Other (See Comments)    Myalgias    Medications: Current Facility-Administered Medications  Medication Dose Route Frequency Provider Last Rate Last Dose  . 0.9 %  sodium chloride infusion  500 mL Intravenous Continuous Armbruster, Carlota Raspberry, MD      . betamethasone acetate-betamethasone sodium phosphate (CELESTONE) injection 3 mg  3 mg Intramuscular Once Evans, Ruby Cola M, DPM      . omalizumab [name]) injection 150 mg  150 mg Subcutaneous Q28 days Jiles Prows, MD   150 mg at 04/04/18 7544   Current Outpatient Medications  Medication Sig Dispense Refill  . ARIPiprazole (ABILIFY) 20 MG tablet Take 20 mg by mouth daily.   0  . Armodafinil 250 MG tablet Take 250 mg by mouth daily.   0  . aspirin 325 MG tablet Take 325 mg by mouth daily.    . BELSOMRA 20 MG TABS Take 20 mg by mouth at bedtime.   0  . DULoxetine (CYMBALTA) 60 MG capsule Take 60 mg by mouth daily.    Marland Kitchen ezetimibe (ZETIA) 10 MG tablet Take 1 tablet (10 mg total) by mouth daily. NEED OV. 1 tablet 0  . finasteride (PROSCAR) 5 MG tablet Take 5 mg by mouth daily.     . fish oil-omega-3 fatty acids 1000 MG capsule Take 1 g by mouth daily.     Marland Kitchen FLOVENT HFA 110 MCG/ACT inhaler inhale 2 puffs INTO LUNGS twice a day (Patient taking differently: Inhale 2 puffs into lungs twice daily as needed for shortness of breath) 12 Inhaler 0  . gemfibrozil (LOPID) 600 MG tablet Take 600 mg by mouth 2 (two) times daily.    . hydrOXYzine (ATARAX/VISTARIL) 50 MG tablet Take 50 mg by mouth at bedtime    . ipratropium-albuterol (DUONEB) 0.5-2.5 (3) MG/3ML SOLN Take 3 mLs by nebulization every 6 (six) hours as needed. (Patient taking differently: Take 3 mLs by nebulization every 6 (six) hours as needed (for shortness of breath or wheezing). ) 75 mL 3  . isosorbide mononitrate (IMDUR) 30 MG 24 hr tablet take 1 tablet by mouth once daily (Patient taking  differently: Take 30 mg by mouth at bedtime) 15 tablet 0  . levothyroxine (SYNTHROID, LEVOTHROID) 25 MCG tablet Take 25 mcg by mouth daily before breakfast.     . montelukast (SINGULAIR) 10 MG tablet Take 1 tablet (10 mg total) by mouth at bedtime. 30 tablet 5  . Multiple Vitamin (MULTIVITAMIN) tablet Take 1 tablet by mouth daily.    . nitroGLYCERIN (NITROSTAT) 0.4 MG SL tablet Place 1 tablet (0.4 mg total) under the tongue every 5 (five) minutes as needed for chest pain. 25 tablet 3  . omeprazole (PRILOSEC) 20 MG capsule TAKE 1 CAPSULE BY MOUTH TWICE A DAY AS DIRECTED 60 capsule 4  . PROAIR HFA 108 (90 Base) MCG/ACT inhaler inhale 2 puffs by mouth every 4 to  6 hours if needed for cough or wheezing 8.5 g 1  . silodosin (RAPAFLO) 8 MG CAPS capsule Take 8 mg by mouth at bedtime.     . vitamin C (ASCORBIC ACID) 500 MG tablet Take 500 mg by mouth 2 (two) times daily.     [name] 150 MG injection INJECT 150MG SUBCUTANEOUSLY EVERY 4 WEEKS (GIVEN AT PRESCRIBER OFFICE) 1 vial 11  . clindamycin (CLEOCIN T) 1 % external solution clindamycin phosphate 1 % topical solution  apply externally to affected area twice a day    . mometasone-formoterol (DULERA) 200-5 MCG/ACT AERO Inhale 2 puffs into the lungs 2 (two) times daily. (Patient not taking: Reported on 04/02/2018) 1 Inhaler 5  . Spacer/Aero-Holding Chambers (AEROCHAMBER PLUS) inhaler Use as instructed (Patient not taking: Reported on 04/02/2018) 1 each 2    Results for orders placed or performed during the hospital encounter of 04/04/18 (from the past 48 hour(s))  Surgical pcr screen     Status: None   Collection Time: 04/04/18  3:55 PM  Result Value Ref Range   MRSA, PCR NEGATIVE NEGATIVE   Staphylococcus aureus NEGATIVE NEGATIVE    Comment: (NOTE) The Xpert SA Assay (FDA approved for NASAL specimens in patients 14 years of age and older), is one component of a comprehensive surveillance program. It is not intended to diagnose infection nor to guide  or monitor treatment. Performed at [facility], [place] [address] [place], [place] [address]   Basic metabolic panel     Status: Abnormal   Collection Time: 04/04/18  4:16 PM  Result Value Ref Range   Sodium 144 135 - 145 mmol/L   Potassium 4.2 3.5 - 5.1 mmol/L   Chloride 111 98 - 111 mmol/L    Comment: Please note change in reference range.   CO2 23 22 - 32 mmol/L   Glucose, Bld 113 (H) 70 - 99 mg/dL    Comment: Please note change in reference range.   BUN 26 (H) 8 - 23 mg/dL    Comment: Please note change in reference range.   Creatinine, Ser 1.52 (H) 0.61 - 1.24 mg/dL   Calcium 9.5 8.9 - 10.3 mg/dL   GFR calc non Af Amer 45 (L) >60 mL/min   GFR calc Af Amer 52 (L) >60 mL/min    Comment: (NOTE) The eGFR has been calculated using the CKD EPI equation. This calculation has not been validated in all clinical situations. eGFR's persistently <60 mL/min signify possible Chronic Kidney Disease.    Anion gap 10 5 - 15    Comment: Performed at [facility] [address], [place] [address]  CBC     Status: None   Collection Time: 04/04/18  4:16 PM  Result Value Ref Range   WBC 7.3 4.0 - 10.5 K/uL   RBC 5.19 4.22 - 5.81 MIL/uL   Hemoglobin 14.1 13.0 - 17.0 g/dL   HCT 44.6 39.0 - 52.0 %   MCV 85.9 78.0 - 100.0 fL   MCH 27.2 26.0 - 34.0 pg   MCHC 31.6 30.0 - 36.0 g/dL   RDW 14.2 11.5 - 15.5 %   Platelets 374 150 - 400 K/uL    Comment: Performed at [facility] [address], [place], [place] [address]   No results found.  ROS: Pain with rom of the right lower extremity  Physical Exam: Alert and oriented 67 y.o. male in no acute distress Cranial nerves 2-12 intact Cervical spine:  full rom with no tenderness, nv intact distally Chest: active breath sounds bilaterally, no wheeze rhonchi or rales Heart: regular rate and rhythm, no murmur Abd: non tender non distended with active bowel sounds Hip is stable with rom  Right knee crepitus with  rom rom is painful Antalgic gait No rashes or edema  Assessment/Plan Assessment: right knee end stage osteoarthritis  Plan:  Patient will undergo a right total knee by Dr. Veverly Fells at [facility]. Risks benefits and expectations were discussed with the patient. Patient understand risks, benefits and expectations and wishes to proceed. Preoperative templating of the joint replacement has been completed, documented, and submitted to the Operating Room personnel in order to optimize intra-operative equipment management.   Merla Riches PA-C, MPAS [facility] is now [employer] [facility] [address] [place], [place], [place] [address] Phone: [phone] [email or web address] Facebook  [employer]

## 2018-04-05 NOTE — Progress Notes (Signed)
Anesthesia Chart Review:  Case:  [id number] Date/Time:  04/13/18 0915   Procedure:  RIGHT TOTAL KNEE ARTHROPLASTY (Right Knee)   Anesthesia type:  General   Pre-op diagnosis:  Right knee end stage osteoarthritis   Location:  [facility] OR ROOM 04 / [facility]   Surgeon:  Netta Cedars, MD      DISCUSSION: 67 yo male never smoker for above procedure. Pertinent medical hx includes CAD (s/p stent to RCA in 2000), HTN, depression, GERD, RCC s/p nephrectomy, peripheral neuropathy, OSA on CPAP, Asthma, S/p revision C5-7 ACDF 08/27/2014 due to traumatic Fx Right C6-7 facet, HNP C6-7, C7 radiculopathy, subluxation C6 on C7  Per Dr. Kennon Holter note from 07/14/2017 he has history of CAD status post RCA stenting by Dr. Rex Kras in 2000. He had a repeat catheterization in 2011 revealing a widely patent stent with otherwise minimal CAD and normal LV function. He's been asymptomatic since.  Pt seen by Cardiology Jory Sims NP on 04/09/2018 for clearance. Per her note, "Chart reviewed as part of pre-operative protocol coverage. Given past medical history and time since last visit, based on ACC/AHA guidelines, TRISTEN LUCE would be at acceptable risk for the planned procedure without further cardiovascular testing. He may stop ASA. Resume at 81 mg daily"  Anticipate he can proceed with surgery as scheduled barring acute status change.  VS: BP (!) 156/83   Pulse 88   Temp 36.7 C   Resp 20   Ht 5\' 7"  (1.702 m)   Wt 193 lb 8 oz (87.8 kg)   SpO2 96%   BMI 30.31 kg/m   PROVIDERS: Burnard Bunting, MD is PCP last seen 04/04/2018  Quay Burow is Cardiologist   LABS: Labs reviewed: Acceptable for surgery. Creatinine with chronic mild elevation s/p nephrectomy for RCC. Based on review of prior labs appear stable.  (all labs ordered are listed, but only abnormal results are displayed)  Labs Reviewed  BASIC METABOLIC PANEL - Abnormal; Notable for the following components:      Result Value   Glucose, Bld 113  (*)    BUN 26 (*)    Creatinine, Ser 1.52 (*)    GFR calc non Af Amer 45 (*)    GFR calc Af Amer 52 (*)    All other components within normal limits  SURGICAL PCR SCREEN  CBC     IMAGES: CXR 03/26/2018 done at [facility] 01/17/2014 Chronic appearing right basilar atelectasis. No acute infiltrate, edema, mass, or effusion otherwise. Prior cervical fusion.  Bony structures are otherwise unremarkable. Hyperinflation.  Impression: Stable chronic appearing changes in the chest. No interval change or superimposed acute process  EKG: 07/14/2017: NSR. Nonspecific T wave abnormality.  CV: Myocardial perfusion 06/26/2013  Impression Exercise Capacity:  Good exercise capacity. BP Response:  Hypertensive blood pressure response. Clinical Symptoms:  There is dyspnea. ECG Impression:  Insignificant upsloping ST segment depression. Comparison with Prior Nuclear Study: No significant change from previous study  Overall Impression:  Normal stress nuclear study.  LV Wall Motion:  NL LV Function; NL Wall Motion; EF 57%  LHC 03/18/2010 (in media tab under Cardic Cath Report 03/18/2010) 1.  There was normal LV systolic function.  No wall motion abnormalities.  No mitral regurgitation.  Ejection fraction in excess of 60%. 2.  Coronary arteriography: On fluoroscopy, the stent to be seen in the distribution of the right coronary artery. -Left main had 30% mid and distal narrowing.  When compared to the August 2010 cath, this  is a substantial improvement in the left main disease. -Circumflex: Circumflex was a large system.  The first OM was about 3.5 to 4 mm in diameter and bifurcated, free of disease.  The ongoing circ was smaller and OM 2 was smaller.  This system was also free of disease. -LAD: The LAD across the apex.  The heart gave rise to a first diagonal vessel that was free of disease. -Right coronary artery: There was a stent in the proximal right coronary artery.   There was 20-30% tubular in-stent restenosis   Conclusion: 1.  Normal LV systolic function. 2.  Minimal in-stent restenosis. 3.  30% left main which is actually improving compared to August 2010. Discussion: I can see no coronary anatomy that would be responsible for his complaints of indigestion.  I plan to discontinue his nitroglycerin.  We will hydrate him aggressively because he has a single kidney and we will probably discharge him home on PPIs.  PULM Spirometry (12/22/2017): results normal (FEV1: 2.42/89%, FVC: 3.08/84%, FEV1/FVC: 79%).   Past Medical History:  Diagnosis Date  . Cervical vertebral fusion 05/02/2014   Now presenting with C5-6 radiculopathy  . Coronary artery disease   . Depression   . Depression, prolonged 06/26/2013  . Diverticulosis   . GERD (gastroesophageal reflux disease)   . Heart attack ([place]) 2009  . History of esophageal stricture   . Hyperlipidemia   . Hypertension   . IBS (irritable bowel syndrome)   . Internal hemorrhoids   . Osteoarthritis   . 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 Peripheral neuropathy   . Renal cell carcinoma 1997   Left kidney  . Shoulder pain, right   . Sleep apnea    wears CPAP  . Sleep talking   . Snoring 12/25/2013  . Wears glasses     Past Surgical History:  Procedure Laterality Date  . ANTERIOR CERVICAL DECOMP/DISCECTOMY FUSION N/A 08/27/2014   Procedure: Cervical six-seven anterior cervical decompression fusion with removal of hardware at Cervical five-six;  Surgeon: Erline Levine, MD;  Location: [facility];  Service: Neurosurgery;  Laterality: N/A;  Cervical six-seven anterior cervical decompression fusion with removal of hardware at Cervical five-six  . BACK SURGERY  1992, 2013  . CARDIAC CATHETERIZATION  05/10/2007   Minimal CAD, normal LV systolic function, medical management  . CARDIAC CATHETERIZATION  04/08/2008   RCA ulcerated 70-80% stenosis, stented with a 3x1mm Endeavor stent at 13atm for 50sec,  reduced from 80% ulcerated stenosis to 0%.  . CARDIAC CATHETERIZATION  05/07/2009   50% distal left main disease-IVUS or flow wire too dangerous in particular setting to perform intervention.  Marland Kitchen CARDIAC CATHETERIZATION  03/18/2010   Medical management  . CARDIOVASCULAR STRESS TEST  02/09/2012   Normal, no significant wall abnormalities noted  . CARPAL TUNNEL RELEASE Bilateral   . COLONOSCOPY W/ BIOPSIES AND POLYPECTOMY    . CORONARY STENT PLACEMENT    . FEMUR FRACTURE SURGERY Left 1995  . HERNIA REPAIR    . NECK SURGERY  2005  . NEPHRECTOMY Left    secondary to cancer  . TOTAL KNEE ARTHROPLASTY Left 2005    MEDICATIONS: . ARIPiprazole (ABILIFY) 20 MG tablet  . Armodafinil 250 MG tablet  . aspirin 325 MG tablet  . BELSOMRA 20 MG TABS  . clindamycin (CLEOCIN T) 1 % external solution  . DULoxetine (CYMBALTA) 60 MG capsule  . ezetimibe (ZETIA) 10 MG tablet  . finasteride (PROSCAR) 5 MG tablet  . fish oil-omega-3 fatty  acids 1000 MG capsule  . FLOVENT HFA 110 MCG/ACT inhaler  . gemfibrozil (LOPID) 600 MG tablet  . hydrOXYzine (ATARAX/VISTARIL) 50 MG tablet  . ipratropium-albuterol (DUONEB) 0.5-2.5 (3) MG/3ML SOLN  . isosorbide mononitrate (IMDUR) 30 MG 24 hr tablet  . levothyroxine (SYNTHROID, LEVOTHROID) 25 MCG tablet  . mometasone-formoterol (DULERA) 200-5 MCG/ACT AERO  . montelukast (SINGULAIR) 10 MG tablet  . Multiple Vitamin (MULTIVITAMIN) tablet  . nitroGLYCERIN (NITROSTAT) 0.4 MG SL tablet  . omeprazole (PRILOSEC) 20 MG capsule  . PROAIR HFA 108 (90 Base) MCG/ACT inhaler  . silodosin (RAPAFLO) 8 MG CAPS capsule  . Spacer/Aero-Holding Chambers (AEROCHAMBER PLUS) inhaler  . vitamin C (ASCORBIC ACID) 500 MG tablet  . XOLAIR 150 MG injection   . 0.9 %  sodium chloride infusion  . betamethasone acetate-betamethasone sodium phosphate (CELESTONE) injection 3 mg  . omalizumab [name]) injection 150 mg    Wynonia Musty [facility] Short Stay Center/Anesthesiology Phone [phone] 04/09/2018 2:54 PM

## 2018-04-08 NOTE — Progress Notes (Signed)
Cardiology Office Note   Date:  04/09/2018   ID:  Jared [name], DOB [birth year], MRN [id number]  PCP:  Burnard Bunting, MD  Cardiologist:  [facility]  Chief Complaint  Patient presents with  . Pre-op Exam     History of Present Illness: Jared [name] is a 67 y.o. male who presents for preoperative cardiac evaluation.  The patient is to have right TKA and will need both pharmacy and medical clearance.  The patient has a history of coronary artery disease with stent to his dominant RCA in 2000, with most recent cardiac catheterization in 2011 revealing a widely patent to the RCA with 30% left main stenosis.  He also has a history of hypertension and hyperlipidemia.  Most recent nuclear medicine study in 2014 revealed normal perfusion without any new areas of ischemia.  Surgery is scheduled for April 13, 2018 with Dr. Veverly Fells. He has been holding ASA for 2 days so far.   Past Medical History:  Diagnosis Date  . Cervical vertebral fusion 05/02/2014   Now presenting with C5-6 radiculopathy  . Coronary artery disease   . Depression   . Depression, prolonged 06/26/2013  . Diverticulosis   . GERD (gastroesophageal reflux disease)   . Heart attack ([place]) 2009  . History of esophageal stricture   . Hyperlipidemia   . Hypertension   . IBS (irritable bowel syndrome)   . Internal hemorrhoids   . Osteoarthritis   . 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 Peripheral neuropathy   . Renal cell carcinoma 1997   Left kidney  . Shoulder pain, right   . Sleep apnea    wears CPAP  . Sleep talking   . Snoring 12/25/2013  . Wears glasses     Past Surgical History:  Procedure Laterality Date  . ANTERIOR CERVICAL DECOMP/DISCECTOMY FUSION N/A 08/27/2014   Procedure: Cervical six-seven anterior cervical decompression fusion with removal of hardware at Cervical five-six;  Surgeon: Erline Levine, MD;  Location: [facility];  Service: Neurosurgery;  Laterality: N/A;  Cervical six-seven  anterior cervical decompression fusion with removal of hardware at Cervical five-six  . BACK SURGERY  1992, 2013  . CARDIAC CATHETERIZATION  05/10/2007   Minimal CAD, normal LV systolic function, medical management  . CARDIAC CATHETERIZATION  04/08/2008   RCA ulcerated 70-80% stenosis, stented with a 3x8mm Endeavor stent at 13atm for 50sec, reduced from 80% ulcerated stenosis to 0%.  . CARDIAC CATHETERIZATION  05/07/2009   50% distal left main disease-IVUS or flow wire too dangerous in particular setting to perform intervention.  Marland Kitchen CARDIAC CATHETERIZATION  03/18/2010   Medical management  . CARDIOVASCULAR STRESS TEST  02/09/2012   Normal, no significant wall abnormalities noted  . CARPAL TUNNEL RELEASE Bilateral   . COLONOSCOPY W/ BIOPSIES AND POLYPECTOMY    . CORONARY STENT PLACEMENT    . FEMUR FRACTURE SURGERY Left 1995  . HERNIA REPAIR    . NECK SURGERY  2005  . NEPHRECTOMY Left    secondary to cancer  . TOTAL KNEE ARTHROPLASTY Left 2005     Current Outpatient Medications  Medication Sig Dispense Refill  . ARIPiprazole (ABILIFY) 20 MG tablet Take 20 mg by mouth daily.   0  . Armodafinil 250 MG tablet Take 250 mg by mouth daily.   0  . aspirin 325 MG tablet Take 325 mg by mouth daily.    . BELSOMRA 20 MG TABS Take 20 mg by mouth at bedtime.   0  . clindamycin (  CLEOCIN T) 1 % external solution clindamycin phosphate 1 % topical solution  apply externally to affected area twice a day    . DULoxetine (CYMBALTA) 60 MG capsule Take 60 mg by mouth daily.    Marland Kitchen ezetimibe (ZETIA) 10 MG tablet Take 1 tablet (10 mg total) by mouth daily. NEED OV. 1 tablet 0  . finasteride (PROSCAR) 5 MG tablet Take 5 mg by mouth daily.     . fish oil-omega-3 fatty acids 1000 MG capsule Take 1 g by mouth daily.     Marland Kitchen FLOVENT HFA 110 MCG/ACT inhaler inhale 2 puffs INTO LUNGS twice a day (Patient taking differently: Inhale 2 puffs into lungs twice daily as needed for shortness of breath) 12 Inhaler 0  .  gemfibrozil (LOPID) 600 MG tablet Take 600 mg by mouth 2 (two) times daily.    . hydrOXYzine (ATARAX/VISTARIL) 50 MG tablet Take 50 mg by mouth at bedtime    . ipratropium-albuterol (DUONEB) 0.5-2.5 (3) MG/3ML SOLN Take 3 mLs by nebulization every 6 (six) hours as needed. (Patient taking differently: Take 3 mLs by nebulization every 6 (six) hours as needed (for shortness of breath or wheezing). ) 75 mL 3  . isosorbide mononitrate (IMDUR) 30 MG 24 hr tablet take 1 tablet by mouth once daily (Patient taking differently: Take 30 mg by mouth at bedtime) 15 tablet 0  . levothyroxine (SYNTHROID, LEVOTHROID) 25 MCG tablet Take 25 mcg by mouth daily before breakfast.     . mometasone-formoterol (DULERA) 200-5 MCG/ACT AERO Inhale 2 puffs into the lungs 2 (two) times daily. 1 Inhaler 5  . montelukast (SINGULAIR) 10 MG tablet Take 1 tablet (10 mg total) by mouth at bedtime. 30 tablet 5  . Multiple Vitamin (MULTIVITAMIN) tablet Take 1 tablet by mouth daily.    . nitroGLYCERIN (NITROSTAT) 0.4 MG SL tablet Place 1 tablet (0.4 mg total) under the tongue every 5 (five) minutes as needed for chest pain. 25 tablet 3  . omeprazole (PRILOSEC) 20 MG capsule TAKE 1 CAPSULE BY MOUTH TWICE A DAY AS DIRECTED 60 capsule 4  . PROAIR HFA 108 (90 Base) MCG/ACT inhaler inhale 2 puffs by mouth every 4 to 6 hours if needed for cough or wheezing 8.5 g 1  . silodosin (RAPAFLO) 8 MG CAPS capsule Take 8 mg by mouth at bedtime.     Marland Kitchen Spacer/Aero-Holding Chambers (AEROCHAMBER PLUS) inhaler Use as instructed 1 each 2  . vitamin C (ASCORBIC ACID) 500 MG tablet Take 500 mg by mouth 2 (two) times daily.     [name] 150 MG injection INJECT 150MG  SUBCUTANEOUSLY EVERY 4 WEEKS (GIVEN AT PRESCRIBER OFFICE) 1 vial 11   Current Facility-Administered Medications  Medication Dose Route Frequency Provider Last Rate Last Dose  . 0.9 %  sodium chloride infusion  500 mL Intravenous Continuous Armbruster, Carlota Raspberry, MD      . betamethasone  acetate-betamethasone sodium phosphate (CELESTONE) injection 3 mg  3 mg Intramuscular Once Evans, Ruby Cola M, DPM      . omalizumab [name]) injection 150 mg  150 mg Subcutaneous Q28 days Jiles Prows, MD   150 mg at 04/04/18 1308    Allergies:   Contrast media [iodinated diagnostic agents]; Nsaids; Penicillins; Sulfonamide derivatives; Codeine; and Statins    Social History:  The patient  reports that he has never smoked. He has never used smokeless tobacco. He reports that he does not drink alcohol or use drugs.   Family History:  The patient's family history includes  Asthma in his mother; Barrett's esophagus in his father; Bone cancer in his father; Cancer in his sister; Colon cancer in his maternal uncle; Diabetes in his sister; Esophageal cancer in his father; Heart attack (age of onset: 67) in his paternal grandfather; Heart disease in his father, mother, and paternal grandfather; Hypertension in his mother.    ROS: All other systems are reviewed and negative. Unless otherwise mentioned in H&P    PHYSICAL EXAM: VS:  BP 122/76   Pulse 78   Ht 5\' 7"  (1.702 m)   Wt 195 lb 3.2 oz (88.5 kg)   BMI 30.57 kg/m  , BMI Body mass index is 30.57 kg/m. GEN: Well nourished, well developed, in no acute distress  HEENT: normal  Neck: no JVD, carotid bruits, or masses Cardiac: RRR; no murmurs, rubs, or gallops,no edema  Respiratory:  clear to auscultation bilaterally, normal work of breathing GI: soft, nontender, nondistended, + BS MS: no deformity or atrophy  Skin: warm and dry, no rash Neuro:  Strength and sensation are intact Psych: euthymic mood, full affect   EKG:  NSR rate of 78 bpm with non-specific T-wave abnormality. Unchanged from previous.   Recent Labs: 04/04/2018: BUN 26; Creatinine, Ser 1.52; Hemoglobin 14.1; Platelets 374; Potassium 4.2; Sodium 144    Lipid Panel    Component Value Date/Time   CHOL  03/18/2010 0331    141        ATP III CLASSIFICATION:  <200     mg/dL    Desirable  200-239  mg/dL   Borderline High  >=240    mg/dL   High          TRIG 474 (H) 03/18/2010 0331   HDL 38 (L) 03/18/2010 0331   CHOLHDL 3.7 03/18/2010 0331   VLDL UNABLE TO CALCULATE IF TRIGLYCERIDE OVER 400 mg/dL 03/18/2010 0331   LDLCALC  03/18/2010 0331    UNABLE TO CALCULATE IF TRIGLYCERIDE OVER 400 mg/dL        Total Cholesterol/HDL:CHD Risk Coronary Heart Disease Risk Table                     Men   Women  1/2 Average Risk   3.4   3.3  Average Risk       5.0   4.4  2 X Average Risk   9.6   7.1  3 X Average Risk  23.4   11.0        Use the calculated Patient Ratio above and the CHD Risk Table to determine the patient's CHD Risk.        ATP III CLASSIFICATION (LDL):  <100     mg/dL   Optimal  100-129  mg/dL   Near or Above                    Optimal  130-159  mg/dL   Borderline  160-189  mg/dL   High  >190     mg/dL   Very High      Wt Readings from Last 3 Encounters:  04/09/18 195 lb 3.2 oz (88.5 kg)  04/04/18 193 lb 8 oz (87.8 kg)  12/21/17 194 lb 12.8 oz (88.4 kg)      Other studies Reviewed: Stress Test Impression Exercise Capacity:  Good exercise capacity. BP Response:  Hypertensive blood pressure response. Clinical Symptoms:  There is dyspnea. ECG Impression:  Insignificant upsloping ST segment depression. Comparison with Prior Nuclear Study: No significant change from previous study  Overall Impression:  Normal stress nuclear study.  ASSESSMENT AND PLAN:  1. Pre-Operative Clearance:    Chart reviewed as part of pre-operative protocol coverage. Given past medical history and time since last visit, based on ACC/AHA guidelines, Jared [name] would be at acceptable risk for the planned procedure without further cardiovascular testing. He may stop ASA. Resume at 81 mg daily.   2. CAD; Non-obstructive per recent evaluation per Dr.Berry  Most recent stress test negative for ischemia on 06/26/2013. Resume at 81 mg daily after TKR.   3.  Hypertension: BP is well controlled currently. No changes in regmen.   4. Hypercholesterolemia: Continue statin therapy. Labs per PCP   Current medicines are reviewed at length with the patient today.    Labs/ tests ordered today include: None   Jared [name], ANP, AACC   04/09/2018 2:24 PM    [facility] [address], [place], [place] [address] Phone: [phone]; Fax: [phone]

## 2018-04-09 ENCOUNTER — Ambulatory Visit (INDEPENDENT_AMBULATORY_CARE_PROVIDER_SITE_OTHER): Payer: Medicare Other | Admitting: Adult Health

## 2018-04-09 ENCOUNTER — Encounter: Payer: Self-pay | Admitting: Adult Health

## 2018-04-09 VITALS — BP 122/76 | HR 78 | Ht 67.0 in | Wt 195.2 lb

## 2018-04-09 DIAGNOSIS — I10 Essential (primary) hypertension: Secondary | ICD-10-CM

## 2018-04-09 DIAGNOSIS — E7800 Pure hypercholesterolemia, unspecified: Secondary | ICD-10-CM

## 2018-04-09 DIAGNOSIS — Z01810 Encounter for preprocedural cardiovascular examination: Secondary | ICD-10-CM | POA: Diagnosis not present

## 2018-04-09 DIAGNOSIS — I2510 Atherosclerotic heart disease of native coronary artery without angina pectoris: Secondary | ICD-10-CM

## 2018-04-09 MED ORDER — ASPIRIN 81 MG PO TABS: 81.0000 mg | ORAL_TABLET | Freq: Every day | ORAL | Status: DC

## 2018-04-09 NOTE — Patient Instructions (Signed)
Medication Instructions:  NO CHANGES- Your physician recommends that you continue on your current medications as directed. Please refer to the Current Medication list given to you today.  If you need a refill on your cardiac medications before your next appointment, please call your pharmacy.  Special Instructions: CLEARED FOR RIGHT TOTAL KNEE ARTHROPLASTY WITH DR Veverly Fells  Follow-Up: Your physician wants you to follow-up in: 6 MONTHS WITH DR Gwenlyn Found You should receive a reminder letter in the mail two months in advance. If you do not receive a letter, please call our office NOV 2019 to schedule the JAN 2020 follow-up appointment.   Thank you for choosing [facility]!!

## 2018-04-12 MED ORDER — TRANEXAMIC ACID 1000 MG/10ML IV SOLN
1000.0000 mg | INTRAVENOUS | Status: AC
  Administered 2018-04-13: 1000 mg via INTRAVENOUS
  Filled 2018-04-12: qty 1100

## 2018-04-12 NOTE — Anesthesia Preprocedure Evaluation (Addendum)
Anesthesia Evaluation  Patient identified by MRN, date of birth, ID band Patient awake    Reviewed: Allergy & Precautions, NPO status , Patient's Chart, lab work & pertinent test results  Airway Mallampati: II  TM Distance: >3 FB Neck ROM: Full    Dental no notable dental hx. (+) Teeth Intact, Dental Advisory Given,    Pulmonary asthma , sleep apnea ,    Pulmonary exam normal breath sounds clear to auscultation       Cardiovascular hypertension, Pt. on medications + CAD  Normal cardiovascular exam Rhythm:Regular Rate:Normal     Neuro/Psych negative psych ROS   GI/Hepatic GERD  ,  Endo/Other    Renal/GU Renal disease     Musculoskeletal  (+) Arthritis ,   Abdominal   Peds  Hematology   Anesthesia Other Findings   Reproductive/Obstetrics                            Lab Results  Component Value Date   WBC 7.3 04/04/2018   HGB 14.1 04/04/2018   HCT 44.6 04/04/2018   MCV 85.9 04/04/2018   PLT 374 04/04/2018    Anesthesia Physical Anesthesia Plan  ASA: II  Anesthesia Plan: Spinal   Post-op Pain Management:    Induction:   PONV Risk Score and Plan:   Airway Management Planned: Mask, Natural Airway and Nasal Cannula  Additional Equipment:   Intra-op Plan:   Post-operative Plan:   Informed Consent: I have reviewed the patients History and Physical, chart, labs and discussed the procedure including the risks, benefits and alternatives for the proposed anesthesia with the patient or authorized representative who has indicated his/her understanding and acceptance.     Plan Discussed with:   Anesthesia Plan Comments:         Anesthesia Quick Evaluation

## 2018-04-13 ENCOUNTER — Ambulatory Visit (HOSPITAL_COMMUNITY): Payer: Medicare Other | Admitting: Anesthesiology

## 2018-04-13 ENCOUNTER — Ambulatory Visit (HOSPITAL_COMMUNITY): Payer: Medicare Other | Admitting: Physician Assistant

## 2018-04-13 ENCOUNTER — Inpatient Hospital Stay (HOSPITAL_COMMUNITY)
Admission: AD | Admit: 2018-04-13 | Discharge: 2018-04-16 | DRG: 470 | Disposition: A | Discharge status: Home Health | Payer: Medicare Other | Attending: Orthopedic Surgery | Admitting: Orthopedic Surgery

## 2018-04-13 ENCOUNTER — Other Ambulatory Visit: Payer: Self-pay

## 2018-04-13 ENCOUNTER — Encounter (HOSPITAL_COMMUNITY)
Admission: AD | Disposition: A | Discharge status: Home Health | Payer: Self-pay | Source: Home / Self Care | Attending: Orthopedic Surgery

## 2018-04-13 ENCOUNTER — Encounter (HOSPITAL_COMMUNITY): Payer: Self-pay | Admitting: Anesthesiology

## 2018-04-13 DIAGNOSIS — K219 Gastro-esophageal reflux disease without esophagitis: Secondary | ICD-10-CM | POA: Diagnosis present

## 2018-04-13 DIAGNOSIS — Z7982 Long term (current) use of aspirin: Secondary | ICD-10-CM | POA: Diagnosis not present

## 2018-04-13 DIAGNOSIS — Z79890 Hormone replacement therapy (postmenopausal): Secondary | ICD-10-CM

## 2018-04-13 DIAGNOSIS — G4730 Sleep apnea, unspecified: Secondary | ICD-10-CM | POA: Diagnosis present

## 2018-04-13 DIAGNOSIS — Z79899 Other long term (current) drug therapy: Secondary | ICD-10-CM

## 2018-04-13 DIAGNOSIS — Z888 Allergy status to other drugs, medicaments and biological substances status: Secondary | ICD-10-CM | POA: Diagnosis not present

## 2018-04-13 DIAGNOSIS — E785 Hyperlipidemia, unspecified: Secondary | ICD-10-CM | POA: Diagnosis present

## 2018-04-13 DIAGNOSIS — I10 Essential (primary) hypertension: Secondary | ICD-10-CM | POA: Diagnosis present

## 2018-04-13 DIAGNOSIS — M25761 Osteophyte, right knee: Secondary | ICD-10-CM | POA: Diagnosis present

## 2018-04-13 DIAGNOSIS — K589 Irritable bowel syndrome without diarrhea: Secondary | ICD-10-CM | POA: Diagnosis present

## 2018-04-13 DIAGNOSIS — Z882 Allergy status to sulfonamides status: Secondary | ICD-10-CM | POA: Diagnosis not present

## 2018-04-13 DIAGNOSIS — I2510 Atherosclerotic heart disease of native coronary artery without angina pectoris: Secondary | ICD-10-CM | POA: Diagnosis present

## 2018-04-13 DIAGNOSIS — Z91041 Radiographic dye allergy status: Secondary | ICD-10-CM | POA: Diagnosis not present

## 2018-04-13 DIAGNOSIS — Z85528 Personal history of other malignant neoplasm of kidney: Secondary | ICD-10-CM | POA: Diagnosis not present

## 2018-04-13 DIAGNOSIS — Z880 Allergy status to penicillin: Secondary | ICD-10-CM

## 2018-04-13 DIAGNOSIS — M1711 Unilateral primary osteoarthritis, right knee: Secondary | ICD-10-CM | POA: Diagnosis present

## 2018-04-13 DIAGNOSIS — I252 Old myocardial infarction: Secondary | ICD-10-CM | POA: Diagnosis not present

## 2018-04-13 DIAGNOSIS — G629 Polyneuropathy, unspecified: Secondary | ICD-10-CM | POA: Diagnosis present

## 2018-04-13 DIAGNOSIS — M2241 Chondromalacia patellae, right knee: Secondary | ICD-10-CM | POA: Diagnosis present

## 2018-04-13 DIAGNOSIS — Z981 Arthrodesis status: Secondary | ICD-10-CM | POA: Diagnosis not present

## 2018-04-13 DIAGNOSIS — F329 Major depressive disorder, single episode, unspecified: Secondary | ICD-10-CM | POA: Diagnosis present

## 2018-04-13 DIAGNOSIS — M25561 Pain in right knee: Secondary | ICD-10-CM | POA: Diagnosis present

## 2018-04-13 DIAGNOSIS — Z886 Allergy status to analgesic agent status: Secondary | ICD-10-CM

## 2018-04-13 DIAGNOSIS — Z96651 Presence of right artificial knee joint: Secondary | ICD-10-CM

## 2018-04-13 HISTORY — PX: TOTAL KNEE ARTHROPLASTY: SHX125

## 2018-04-13 SURGERY — ARTHROPLASTY, KNEE, TOTAL
Anesthesia: Spinal | Site: Knee | Laterality: Right

## 2018-04-13 MED ORDER — ONDANSETRON HCL 4 MG/2ML IJ SOLN
INTRAMUSCULAR | Status: DC | PRN
  Administered 2018-04-13: 4 mg via INTRAVENOUS

## 2018-04-13 MED ORDER — OMALIZUMAB 150 MG ~~LOC~~ SOLR: 150.0000 mg | SUBCUTANEOUS | Status: DC

## 2018-04-13 MED ORDER — HYDROXYZINE HCL 25 MG PO TABS: 50.0000 mg | ORAL_TABLET | Freq: Every evening | ORAL | Status: DC | PRN

## 2018-04-13 MED ORDER — SODIUM CHLORIDE 0.9 % IV SOLN
INTRAVENOUS | Status: DC
  Administered 2018-04-13: 16:00:00 via INTRAVENOUS

## 2018-04-13 MED ORDER — ROPIVACAINE HCL 5 MG/ML IJ SOLN
INTRAMUSCULAR | Status: DC | PRN
  Administered 2018-04-13: 30 mL via PERINEURAL

## 2018-04-13 MED ORDER — PANTOPRAZOLE SODIUM 40 MG PO TBEC
40.0000 mg | DELAYED_RELEASE_TABLET | Freq: Every day | ORAL | Status: DC
  Administered 2018-04-13 – 2018-04-16 (×4): 40 mg via ORAL
  Filled 2018-04-13 (×4): qty 1

## 2018-04-13 MED ORDER — CLINDAMYCIN PHOSPHATE 600 MG/50ML IV SOLN
600.0000 mg | Freq: Four times a day (QID) | INTRAVENOUS | Status: AC
  Administered 2018-04-13 (×2): 600 mg via INTRAVENOUS
  Filled 2018-04-13 (×2): qty 50

## 2018-04-13 MED ORDER — SODIUM CHLORIDE 0.9 % IR SOLN
Status: DC | PRN
  Administered 2018-04-13: 3000 mL

## 2018-04-13 MED ORDER — ASPIRIN 81 MG PO CHEW
81.0000 mg | CHEWABLE_TABLET | Freq: Two times a day (BID) | ORAL | Status: DC
  Administered 2018-04-13 – 2018-04-16 (×6): 81 mg via ORAL
  Filled 2018-04-13 (×6): qty 1

## 2018-04-13 MED ORDER — MODAFINIL 100 MG PO TABS
200.0000 mg | ORAL_TABLET | Freq: Every day | ORAL | Status: DC
  Administered 2018-04-14 – 2018-04-16 (×3): 200 mg via ORAL
  Filled 2018-04-13 (×3): qty 2

## 2018-04-13 MED ORDER — MONTELUKAST SODIUM 10 MG PO TABS
10.0000 mg | ORAL_TABLET | Freq: Every day | ORAL | Status: DC
  Administered 2018-04-13 – 2018-04-15 (×3): 10 mg via ORAL
  Filled 2018-04-13 (×3): qty 1

## 2018-04-13 MED ORDER — HYDROMORPHONE HCL 1 MG/ML IJ SOLN: 0.2500 mg | INTRAMUSCULAR | Status: DC | PRN

## 2018-04-13 MED ORDER — OXYCODONE-ACETAMINOPHEN 5-325 MG PO TABS: 1.0000 | ORAL_TABLET | ORAL | 0 refills | Status: DC | PRN

## 2018-04-13 MED ORDER — ACETAMINOPHEN 325 MG PO TABS
325.0000 mg | ORAL_TABLET | Freq: Four times a day (QID) | ORAL | Status: DC | PRN
  Administered 2018-04-13 – 2018-04-15 (×5): 650 mg via ORAL
  Filled 2018-04-13 (×5): qty 2

## 2018-04-13 MED ORDER — SUGAMMADEX SODIUM 200 MG/2ML IV SOLN
INTRAVENOUS | Status: AC
  Filled 2018-04-13: qty 2

## 2018-04-13 MED ORDER — HYDROCODONE-ACETAMINOPHEN 7.5-325 MG PO TABS: 1.0000 | ORAL_TABLET | Freq: Once | ORAL | Status: DC | PRN

## 2018-04-13 MED ORDER — BISACODYL 10 MG RE SUPP
10.0000 mg | Freq: Every day | RECTAL | Status: DC | PRN
  Administered 2018-04-16: 10 mg via RECTAL
  Filled 2018-04-13: qty 1

## 2018-04-13 MED ORDER — BUDESONIDE 0.25 MG/2ML IN SUSP: 0.2500 mg | Freq: Two times a day (BID) | RESPIRATORY_TRACT | Status: DC

## 2018-04-13 MED ORDER — METOCLOPRAMIDE HCL 5 MG/ML IJ SOLN: 5.0000 mg | Freq: Three times a day (TID) | INTRAMUSCULAR | Status: DC | PRN

## 2018-04-13 MED ORDER — TRANEXAMIC ACID 1000 MG/10ML IV SOLN
1000.0000 mg | Freq: Once | INTRAVENOUS | Status: AC
  Administered 2018-04-13: 1000 mg via INTRAVENOUS
  Filled 2018-04-13: qty 10

## 2018-04-13 MED ORDER — FENTANYL CITRATE (PF) 100 MCG/2ML IJ SOLN
INTRAMUSCULAR | Status: AC
  Administered 2018-04-13: 50 ug via INTRAVENOUS
  Filled 2018-04-13: qty 2

## 2018-04-13 MED ORDER — EZETIMIBE 10 MG PO TABS
10.0000 mg | ORAL_TABLET | Freq: Every day | ORAL | Status: DC
  Administered 2018-04-14 – 2018-04-16 (×3): 10 mg via ORAL
  Filled 2018-04-13 (×3): qty 1

## 2018-04-13 MED ORDER — METHOCARBAMOL 1000 MG/10ML IJ SOLN
500.0000 mg | Freq: Four times a day (QID) | INTRAVENOUS | Status: DC | PRN
  Filled 2018-04-13: qty 5

## 2018-04-13 MED ORDER — OMEGA-3 FATTY ACIDS 1000 MG PO CAPS: 1.0000 g | ORAL_CAPSULE | Freq: Every day | ORAL | Status: DC

## 2018-04-13 MED ORDER — SODIUM CHLORIDE 0.9 % IV SOLN
INTRAVENOUS | Status: DC | PRN
  Administered 2018-04-13: 50 ug/min via INTRAVENOUS

## 2018-04-13 MED ORDER — ONDANSETRON HCL 4 MG/2ML IJ SOLN
INTRAMUSCULAR | Status: AC
  Filled 2018-04-13: qty 2

## 2018-04-13 MED ORDER — CHLORHEXIDINE GLUCONATE 4 % EX LIQD: 60.0000 mL | Freq: Once | CUTANEOUS | Status: DC

## 2018-04-13 MED ORDER — PHENOL 1.4 % MT LIQD: 1.0000 | OROMUCOSAL | Status: DC | PRN

## 2018-04-13 MED ORDER — ACETAMINOPHEN 10 MG/ML IV SOLN: 1000.0000 mg | Freq: Once | INTRAVENOUS | Status: DC | PRN

## 2018-04-13 MED ORDER — FENTANYL CITRATE (PF) 100 MCG/2ML IJ SOLN
INTRAMUSCULAR | Status: DC | PRN
  Administered 2018-04-13: 50 ug via INTRAVENOUS

## 2018-04-13 MED ORDER — LACTATED RINGERS IV SOLN
INTRAVENOUS | Status: DC
  Administered 2018-04-13 (×2): via INTRAVENOUS

## 2018-04-13 MED ORDER — OXYCODONE HCL 5 MG PO TABS
5.0000 mg | ORAL_TABLET | ORAL | Status: DC | PRN
  Administered 2018-04-13 – 2018-04-16 (×13): 10 mg via ORAL
  Filled 2018-04-13 (×13): qty 2

## 2018-04-13 MED ORDER — PROPOFOL 1000 MG/100ML IV EMUL
INTRAVENOUS | Status: AC
  Filled 2018-04-13: qty 100

## 2018-04-13 MED ORDER — LIDOCAINE 2% (20 MG/ML) 5 ML SYRINGE
INTRAMUSCULAR | Status: AC
  Filled 2018-04-13: qty 5

## 2018-04-13 MED ORDER — POLYETHYLENE GLYCOL 3350 17 G PO PACK: 17.0000 g | PACK | Freq: Every day | ORAL | Status: DC | PRN

## 2018-04-13 MED ORDER — METOCLOPRAMIDE HCL 5 MG PO TABS: 5.0000 mg | ORAL_TABLET | Freq: Three times a day (TID) | ORAL | Status: DC | PRN

## 2018-04-13 MED ORDER — GEMFIBROZIL 600 MG PO TABS
600.0000 mg | ORAL_TABLET | Freq: Two times a day (BID) | ORAL | Status: DC
  Administered 2018-04-13 – 2018-04-16 (×6): 600 mg via ORAL
  Filled 2018-04-13 (×6): qty 1

## 2018-04-13 MED ORDER — CLINDAMYCIN PHOSPHATE 900 MG/50ML IV SOLN
900.0000 mg | INTRAVENOUS | Status: AC
  Administered 2018-04-13: 900 mg via INTRAVENOUS
  Filled 2018-04-13: qty 50

## 2018-04-13 MED ORDER — ALBUTEROL SULFATE HFA 108 (90 BASE) MCG/ACT IN AERS: 2.0000 | INHALATION_SPRAY | RESPIRATORY_TRACT | Status: DC | PRN

## 2018-04-13 MED ORDER — MIDAZOLAM HCL 2 MG/2ML IJ SOLN
1.0000 mg | Freq: Once | INTRAMUSCULAR | Status: AC
  Administered 2018-04-13: 1 mg via INTRAVENOUS

## 2018-04-13 MED ORDER — TAMSULOSIN HCL 0.4 MG PO CAPS
0.4000 mg | ORAL_CAPSULE | Freq: Every day | ORAL | Status: DC
  Administered 2018-04-13 – 2018-04-15 (×3): 0.4 mg via ORAL
  Filled 2018-04-13 (×4): qty 1

## 2018-04-13 MED ORDER — HYDROMORPHONE HCL 1 MG/ML IJ SOLN
0.5000 mg | INTRAMUSCULAR | Status: DC | PRN
  Administered 2018-04-13 – 2018-04-16 (×10): 1 mg via INTRAVENOUS
  Filled 2018-04-13 (×10): qty 1

## 2018-04-13 MED ORDER — METHOCARBAMOL 500 MG PO TABS: 500.0000 mg | ORAL_TABLET | Freq: Three times a day (TID) | ORAL | 1 refills | Status: DC | PRN

## 2018-04-13 MED ORDER — BUPIVACAINE IN DEXTROSE 0.75-8.25 % IT SOLN
INTRATHECAL | Status: DC | PRN
  Administered 2018-04-13: 2 mL via INTRATHECAL

## 2018-04-13 MED ORDER — FERROUS SULFATE 325 (65 FE) MG PO TABS
325.0000 mg | ORAL_TABLET | Freq: Three times a day (TID) | ORAL | Status: DC
  Administered 2018-04-13 – 2018-04-16 (×9): 325 mg via ORAL
  Filled 2018-04-13 (×9): qty 1

## 2018-04-13 MED ORDER — ASPIRIN 81 MG PO CHEW: 81.0000 mg | CHEWABLE_TABLET | Freq: Two times a day (BID) | ORAL | 0 refills | Status: DC

## 2018-04-13 MED ORDER — ADULT MULTIVITAMIN W/MINERALS CH
1.0000 | ORAL_TABLET | Freq: Every day | ORAL | Status: DC
  Administered 2018-04-14 – 2018-04-16 (×3): 1 via ORAL
  Filled 2018-04-13 (×3): qty 1

## 2018-04-13 MED ORDER — MEPERIDINE HCL 50 MG/ML IJ SOLN: 6.2500 mg | INTRAMUSCULAR | Status: DC | PRN

## 2018-04-13 MED ORDER — PROPOFOL 10 MG/ML IV BOLUS
INTRAVENOUS | Status: AC
  Filled 2018-04-13: qty 20

## 2018-04-13 MED ORDER — VITAMIN C 500 MG PO TABS
500.0000 mg | ORAL_TABLET | Freq: Two times a day (BID) | ORAL | Status: DC
  Administered 2018-04-13 – 2018-04-16 (×6): 500 mg via ORAL
  Filled 2018-04-13 (×6): qty 1

## 2018-04-13 MED ORDER — DOCUSATE SODIUM 100 MG PO CAPS
100.0000 mg | ORAL_CAPSULE | Freq: Two times a day (BID) | ORAL | Status: DC
  Administered 2018-04-13 – 2018-04-16 (×6): 100 mg via ORAL
  Filled 2018-04-13 (×6): qty 1

## 2018-04-13 MED ORDER — ASPIRIN 81 MG PO TABS: 81.0000 mg | ORAL_TABLET | Freq: Every day | ORAL | Status: DC

## 2018-04-13 MED ORDER — FINASTERIDE 5 MG PO TABS
5.0000 mg | ORAL_TABLET | Freq: Every day | ORAL | Status: DC
  Administered 2018-04-14 – 2018-04-16 (×3): 5 mg via ORAL
  Filled 2018-04-13 (×4): qty 1

## 2018-04-13 MED ORDER — LEVOTHYROXINE SODIUM 25 MCG PO TABS
25.0000 ug | ORAL_TABLET | Freq: Every day | ORAL | Status: DC
  Administered 2018-04-14 – 2018-04-16 (×3): 25 ug via ORAL
  Filled 2018-04-13 (×3): qty 1

## 2018-04-13 MED ORDER — MIDAZOLAM HCL 2 MG/2ML IJ SOLN
INTRAMUSCULAR | Status: AC
  Administered 2018-04-13: 1 mg via INTRAVENOUS
  Filled 2018-04-13: qty 2

## 2018-04-13 MED ORDER — PROPOFOL 500 MG/50ML IV EMUL
INTRAVENOUS | Status: DC | PRN
  Administered 2018-04-13: 100 ug/kg/min via INTRAVENOUS

## 2018-04-13 MED ORDER — NITROGLYCERIN 0.4 MG SL SUBL: 0.4000 mg | SUBLINGUAL_TABLET | SUBLINGUAL | Status: DC | PRN

## 2018-04-13 MED ORDER — ARIPIPRAZOLE 5 MG PO TABS
20.0000 mg | ORAL_TABLET | Freq: Every day | ORAL | Status: DC
  Administered 2018-04-14 – 2018-04-16 (×3): 20 mg via ORAL
  Filled 2018-04-13 (×4): qty 4

## 2018-04-13 MED ORDER — ONDANSETRON HCL 4 MG PO TABS: 4.0000 mg | ORAL_TABLET | Freq: Four times a day (QID) | ORAL | Status: DC | PRN

## 2018-04-13 MED ORDER — IPRATROPIUM-ALBUTEROL 0.5-2.5 (3) MG/3ML IN SOLN: 3.0000 mL | Freq: Four times a day (QID) | RESPIRATORY_TRACT | Status: DC | PRN

## 2018-04-13 MED ORDER — ONDANSETRON HCL 4 MG/2ML IJ SOLN: 4.0000 mg | Freq: Once | INTRAMUSCULAR | Status: DC | PRN

## 2018-04-13 MED ORDER — ARMODAFINIL 250 MG PO TABS: 250.0000 mg | ORAL_TABLET | Freq: Every day | ORAL | Status: DC

## 2018-04-13 MED ORDER — FENTANYL CITRATE (PF) 100 MCG/2ML IJ SOLN
50.0000 ug | Freq: Once | INTRAMUSCULAR | Status: AC
  Administered 2018-04-13: 50 ug via INTRAVENOUS

## 2018-04-13 MED ORDER — METHOCARBAMOL 500 MG PO TABS
500.0000 mg | ORAL_TABLET | Freq: Four times a day (QID) | ORAL | Status: DC | PRN
Start: 2018-04-13 — End: 2018-04-16
  Administered 2018-04-13 – 2018-04-16 (×9): 500 mg via ORAL
  Filled 2018-04-13 (×9): qty 1

## 2018-04-13 MED ORDER — SUVOREXANT 20 MG PO TABS
20.0000 mg | ORAL_TABLET | Freq: Every day | ORAL | Status: DC
Start: 2018-04-13 — End: 2018-04-13

## 2018-04-13 MED ORDER — DULOXETINE HCL 60 MG PO CPEP
60.0000 mg | ORAL_CAPSULE | Freq: Every day | ORAL | Status: DC
  Administered 2018-04-14 – 2018-04-16 (×3): 60 mg via ORAL
  Filled 2018-04-13 (×4): qty 1

## 2018-04-13 MED ORDER — ISOSORBIDE MONONITRATE ER 30 MG PO TB24
30.0000 mg | ORAL_TABLET | Freq: Every day | ORAL | Status: DC
  Administered 2018-04-13 – 2018-04-16 (×4): 30 mg via ORAL
  Filled 2018-04-13 (×5): qty 1

## 2018-04-13 MED ORDER — MENTHOL 3 MG MT LOZG: 1.0000 | LOZENGE | OROMUCOSAL | Status: DC | PRN

## 2018-04-13 MED ORDER — FENTANYL CITRATE (PF) 250 MCG/5ML IJ SOLN
INTRAMUSCULAR | Status: AC
  Filled 2018-04-13: qty 5

## 2018-04-13 MED ORDER — ONDANSETRON HCL 4 MG/2ML IJ SOLN: 4.0000 mg | Freq: Four times a day (QID) | INTRAMUSCULAR | Status: DC | PRN

## 2018-04-13 MED ORDER — 0.9 % SODIUM CHLORIDE (POUR BTL) OPTIME
TOPICAL | Status: DC | PRN
  Administered 2018-04-13: 1000 mL

## 2018-04-13 SURGICAL SUPPLY — 57 items
BANDAGE ESMARK 6X9 LF (GAUZE/BANDAGES/DRESSINGS) ×1 IMPLANT
BLADE SAG 18X100X1.27 (BLADE) ×2 IMPLANT
BLADE SAW SGTL 13X75X1.27 (BLADE) ×2 IMPLANT
BNDG ELASTIC 6X10 VLCR STRL LF (GAUZE/BANDAGES/DRESSINGS) ×2 IMPLANT
BNDG ESMARK 6X9 LF (GAUZE/BANDAGES/DRESSINGS) ×2
BNDG GAUZE ELAST 4 BULKY (GAUZE/BANDAGES/DRESSINGS) ×4 IMPLANT
BOWL SMART MIX CTS (DISPOSABLE) ×2 IMPLANT
CAP KNEE TOTAL 3 SIGMA ×2 IMPLANT
CEMENT HV SMART SET (Cement) ×4 IMPLANT
CLSR STERI-STRIP ANTIMIC 1/2X4 (GAUZE/BANDAGES/DRESSINGS) ×4 IMPLANT
COVER SURGICAL LIGHT HANDLE (MISCELLANEOUS) ×2 IMPLANT
CUFF TOURNIQUET SINGLE 34IN LL (TOURNIQUET CUFF) IMPLANT
CUFF TOURNIQUET SINGLE 44IN (TOURNIQUET CUFF) IMPLANT
DRAPE EXTREMITY T 121X128X90 (DRAPE) ×2 IMPLANT
DRAPE HALF SHEET 40X57 (DRAPES) ×2 IMPLANT
DRAPE U-SHAPE 47X51 STRL (DRAPES) ×2 IMPLANT
DRSG ADAPTIC 3X8 NADH LF (GAUZE/BANDAGES/DRESSINGS) ×2 IMPLANT
DRSG PAD ABDOMINAL 8X10 ST (GAUZE/BANDAGES/DRESSINGS) ×4 IMPLANT
DURAPREP 26ML APPLICATOR (WOUND CARE) ×2 IMPLANT
ELECT CAUTERY BLADE 6.4 (BLADE) ×2 IMPLANT
ELECT REM PT RETURN 9FT ADLT (ELECTROSURGICAL) ×2
ELECTRODE REM PT RTRN 9FT ADLT (ELECTROSURGICAL) ×1 IMPLANT
GAUZE SPONGE 4X4 12PLY STRL (GAUZE/BANDAGES/DRESSINGS) ×2 IMPLANT
GLOVE BIOGEL PI ORTHO PRO 7.5 (GLOVE) ×1
GLOVE BIOGEL PI ORTHO PRO SZ8 (GLOVE) ×1
GLOVE ORTHO TXT STRL SZ7.5 (GLOVE) ×2 IMPLANT
GLOVE PI ORTHO PRO STRL 7.5 (GLOVE) ×1 IMPLANT
GLOVE PI ORTHO PRO STRL SZ8 (GLOVE) ×1 IMPLANT
GLOVE SURG ORTHO 8.5 STRL (GLOVE) ×2 IMPLANT
GOWN STRL REUS W/ TWL XL LVL3 (GOWN DISPOSABLE) ×3 IMPLANT
GOWN STRL REUS W/TWL XL LVL3 (GOWN DISPOSABLE) ×3
HANDPIECE INTERPULSE COAX TIP (DISPOSABLE) ×1
IMMOBILIZER KNEE 22 UNIV (SOFTGOODS) ×2 IMPLANT
KIT BASIN OR (CUSTOM PROCEDURE TRAY) ×2 IMPLANT
KIT MANIFOLD (MISCELLANEOUS) ×2 IMPLANT
KIT TURNOVER KIT B (KITS) ×2 IMPLANT
MANIFOLD NEPTUNE II (INSTRUMENTS) ×2 IMPLANT
NS IRRIG 1000ML POUR BTL (IV SOLUTION) ×2 IMPLANT
PACK TOTAL JOINT (CUSTOM PROCEDURE TRAY) ×2 IMPLANT
PAD ABD 8X10 STRL (GAUZE/BANDAGES/DRESSINGS) ×4 IMPLANT
PAD ARMBOARD 7.5X6 YLW CONV (MISCELLANEOUS) ×4 IMPLANT
SET HNDPC FAN SPRY TIP SCT (DISPOSABLE) ×1 IMPLANT
STRIP CLOSURE SKIN 1/2X4 (GAUZE/BANDAGES/DRESSINGS) ×4 IMPLANT
SUCTION FRAZIER HANDLE 10FR (MISCELLANEOUS) ×1
SUCTION TUBE FRAZIER 10FR DISP (MISCELLANEOUS) ×1 IMPLANT
SUT MNCRL AB 3-0 PS2 18 (SUTURE) ×2 IMPLANT
SUT VIC AB 0 CT1 27 (SUTURE) ×2
SUT VIC AB 0 CT1 27XBRD ANBCTR (SUTURE) ×2 IMPLANT
SUT VIC AB 1 CT1 27 (SUTURE) ×3
SUT VIC AB 1 CT1 27XBRD ANBCTR (SUTURE) ×3 IMPLANT
SUT VIC AB 2-0 CT1 27 (SUTURE) ×2
SUT VIC AB 2-0 CT1 TAPERPNT 27 (SUTURE) ×2 IMPLANT
TOWEL OR 17X24 6PK STRL BLUE (TOWEL DISPOSABLE) ×2 IMPLANT
TOWEL OR 17X26 10 PK STRL BLUE (TOWEL DISPOSABLE) ×2 IMPLANT
TRAY CATH 16FR W/PLASTIC CATH (SET/KITS/TRAYS/PACK) IMPLANT
TRAY FOLEY MTR SLVR 16FR STAT (SET/KITS/TRAYS/PACK) IMPLANT
YANKAUER SUCT BULB TIP NO VENT (SUCTIONS) ×2 IMPLANT

## 2018-04-13 NOTE — Anesthesia Procedure Notes (Addendum)
Anesthesia Regional Block: Adductor canal block   Pre-Anesthetic Checklist: ,, timeout performed, Correct Patient, Correct Site, Correct Laterality, Correct Procedure, Correct Position, site marked, Risks and benefits discussed,  Surgical consent,  Pre-op evaluation,  At surgeon's request and post-op pain management  Laterality: Right  Prep: Maximum Sterile Barrier Precautions used, chloraprep       Needles:  Injection technique: Single-shot  Needle Type: Echogenic Needle     Needle Length: 9cm  Needle Gauge: 21     Additional Needles:   Procedures:,,,, ultrasound used (permanent image in chart),,,,  Narrative:  Start time: 04/13/2018 9:17 AM End time: 04/13/2018 9:27 AM Injection made incrementally with aspirations every 5 mL.  Performed by: Personally  Anesthesiologist: Barnet Glasgow, MD

## 2018-04-13 NOTE — Anesthesia Procedure Notes (Signed)
Procedure Name: MAC Date/Time: 04/13/2018 10:22 AM Performed by: Kyung Rudd, CRNA Pre-anesthesia Checklist: Patient identified, Emergency Drugs available, Suction available and Patient being monitored Patient Re-evaluated:Patient Re-evaluated prior to induction Oxygen Delivery Method: Simple face mask Induction Type: IV induction Placement Confirmation: positive ETCO2

## 2018-04-13 NOTE — Transfer of Care (Signed)
Immediate Anesthesia Transfer of Care Note  Patient: [name]  Procedure(s) Performed: RIGHT TOTAL KNEE ARTHROPLASTY (Right Knee)  Patient Location: PACU  Anesthesia Type:Spinal  Level of Consciousness: awake, alert  and oriented  Airway & Oxygen Therapy: Patient Spontanous Breathing and Patient connected to nasal cannula oxygen  Post-op Assessment: Report given to RN and Post -op Vital signs reviewed and stable  Post vital signs: Reviewed and stable  Last Vitals:  Vitals Value Taken Time  BP 133/77 04/13/2018 12:46 PM  Temp 36.5 C 04/13/2018 12:46 PM  Pulse 70 04/13/2018 12:49 PM  Resp 15 04/13/2018 12:49 PM  SpO2 98 % 04/13/2018 12:49 PM  Vitals shown include unvalidated device data.  Last Pain:  Vitals:   04/13/18 0902  TempSrc:   PainSc: 4       Patients Stated Pain Goal: 3 (61/47/09 2957)  Complications: No apparent anesthesia complications

## 2018-04-13 NOTE — Brief Op Note (Signed)
04/13/2018  12:44 PM  PATIENT:  Jared [name]  67 y.o. male  PRE-OPERATIVE DIAGNOSIS:  Right knee end stage osteoarthritis  POST-OPERATIVE DIAGNOSIS:  Right knee end stage osteoarthritis  PROCEDURE:  Procedure(s): RIGHT TOTAL KNEE ARTHROPLASTY (Right) DePuy Sigma RP  SURGEON:  Surgeon(s) and Role:    Netta Cedars, MD - Primary  PHYSICIAN ASSISTANT:   ASSISTANTS: Ventura Bruns, PA-C   ANESTHESIA:   regional and general  EBL:  50 mL   BLOOD ADMINISTERED:none  DRAINS: none   LOCAL MEDICATIONS USED:   NONE  SPECIMEN:  No Specimen  DISPOSITION OF SPECIMEN:  N/A  COUNTS:  YES  TOURNIQUET:   Total Tourniquet Time Documented: Thigh (Right) - 94 minutes Total: Thigh (Right) - 94 minutes   DICTATION: .Other Dictation: Dictation Number [id number]  PLAN OF CARE: Admit to inpatient   PATIENT DISPOSITION:  PACU - hemodynamically stable.   Delay start of Pharmacological VTE agent (>24hrs) due to surgical blood loss or risk of bleeding: no

## 2018-04-13 NOTE — Discharge Instructions (Signed)
Ice to the knee as much as possible to reduce pain and inflammation.  Please take the baby aspirin 81 mg chewable twice daily for 30 days AND wear the support hose on both legs 24x7 to prevent blood clots.  You are ok to put full weight on the right leg. DONT prop anything under the knee as it will make your knee very stiff  Do exercises faithfully every hour as you are able to increase ROM  Keep the incision covered and clean and dry for one week, then ok to get it wet in the shower.  Follow up in the office in two weeks, call [phone] for appt

## 2018-04-13 NOTE — Interval H&P Note (Signed)
History and Physical Interval Note:  04/13/2018 10:08 AM  Jared [name]  has presented today for surgery, with the diagnosis of Right knee end stage osteoarthritis  The various methods of treatment have been discussed with the patient and family. After consideration of risks, benefits and other options for treatment, the patient has consented to  Procedure(s): RIGHT TOTAL KNEE ARTHROPLASTY (Right) as a surgical intervention .  The patient's history has been reviewed, patient examined, no change in status, stable for surgery.  I have reviewed the patient's chart and labs.  Questions were answered to the patient's satisfaction.     Desteni Piscopo,STEVEN R

## 2018-04-13 NOTE — Evaluation (Signed)
Physical Therapy Evaluation Patient Details Name: Jared [name] MRN: [id number] DOB: [birth year] Today's Date: 04/13/2018   History of Present Illness  67 y.o. male s/p R TKA 04/13/18. PMH includes: peripheral neuropathy, HTN, HLD, heart attack, GERD, CAD, cervical fusion, L TKA, back surgery.    Clinical Impression  Patient is s/p above surgery resulting in functional limitations due to the deficits listed below (see PT Problem List). PTA, pt mod I with mobility utilizing SPC, community ambulatory, living with wife with 3 stairs to enter home. Today pt presents with post op pain and weakness, ambulating 100' with slow and antalgic gait and min guard. Anticipate pt will progress well when pain controlled.  Patient will benefit from skilled PT to increase their independence and safety with mobility to allow discharge to the venue listed below.        Follow Up Recommendations Follow surgeon's recommendation for DC plan and follow-up therapies;Home health PT    Equipment Recommendations  None recommended by PT    Recommendations for Other Services       Precautions / Restrictions Precautions Precautions: Knee;Fall Precaution Booklet Issued: Yes (comment) Precaution Comments: revewied supine therex and no pillow under knee Required Braces or Orthoses: Knee Immobilizer - Right Restrictions Other Position/Activity Restrictions: WBAT      Mobility  Bed Mobility Overal bed mobility: Needs Assistance Bed Mobility: Sit to Supine;Supine to Sit     Supine to sit: Min assist Sit to supine: Min assist   General bed mobility comments: Min A to assist surgical leg over EOB cues for techinque with contralateral leg  Transfers Overall transfer level: Needs assistance Equipment used: Rolling walker (2 wheeled) Transfers: Sit to/from Stand Sit to Stand: Min assist         General transfer comment: Min A to power up, cues for hand placement.   Ambulation/Gait Ambulation/Gait  assistance: Min guard Gait Distance (Feet): 100 Feet Assistive device: Rolling walker (2 wheeled) Gait Pattern/deviations: Step-to pattern;Antalgic Gait velocity: decreased   General Gait Details: cues for sequencing and use of RW. pt step to gait, very slow and painful.   Stairs            Wheelchair Mobility    Modified Rankin (Stroke Patients Only)       Balance Overall balance assessment: Needs assistance   Sitting balance-Leahy Scale: Good       Standing balance-Leahy Scale: Poor Standing balance comment: reliant on RW due to post op pain                             Pertinent Vitals/Pain Pain Assessment: 0-10 Pain Score: 9  Pain Location: R knee Pain Descriptors / Indicators: Operative site guarding;Aching Pain Intervention(s): Limited activity within patient's tolerance;Monitored during session;Premedicated before session;Repositioned    Home Living Family/patient expects to be discharged to:: Private residence Living Arrangements: Spouse/significant other Available Help at Discharge: Family;Available 24 hours/day Type of Home: House Home Access: Stairs to enter Entrance Stairs-Rails: Left Entrance Stairs-Number of Steps: 2 Home Layout: One level Home Equipment: Tub bench;Cane - single point;Walker - 2 wheels      Prior Function Level of Independence: Independent with assistive device(s)         Comments: SPC for gait, community ambulatory, independent with ADLs     Hand Dominance        Extremity/Trunk Assessment   Upper Extremity Assessment Upper Extremity Assessment: Overall WFL for tasks assessed  Lower Extremity Assessment Lower Extremity Assessment: Overall WFL for tasks assessed(R TKA)       Communication   Communication: No difficulties  Cognition Arousal/Alertness: Awake/alert Behavior During Therapy: WFL for tasks assessed/performed Overall Cognitive Status: Within Functional Limits for tasks assessed                                         General Comments      Exercises Total Joint Exercises Ankle Circles/Pumps: 20 reps Quad Sets: 10 reps Heel Slides: 10 reps Long Arc Quad: 10 reps Goniometric ROM: 90* flexion    Assessment/Plan    PT Assessment Patient needs continued PT services  PT Problem List Decreased strength;Decreased range of motion;Decreased activity tolerance;Pain       PT Treatment Interventions DME instruction;Gait training;Stair training;Functional mobility training;Therapeutic exercise;Therapeutic activities;Balance training    PT Goals (Current goals can be found in the Care Plan section)  Acute Rehab PT Goals Patient Stated Goal: go home on sunday PT Goal Formulation: With patient Time For Goal Achievement: 04/20/18 Potential to Achieve Goals: Good    Frequency 7X/week   Barriers to discharge        Co-evaluation               AM-PAC PT "6 Clicks" Daily Activity  Outcome Measure Difficulty turning over in bed (including adjusting bedclothes, sheets and blankets)?: A Lot Difficulty moving from lying on back to sitting on the side of the bed? : A Lot Difficulty sitting down on and standing up from a chair with arms (e.g., wheelchair, bedside commode, etc,.)?: A Lot Help needed moving to and from a bed to chair (including a wheelchair)?: A Little Help needed walking in hospital room?: A Little Help needed climbing 3-5 steps with a railing? : A Lot 6 Click Score: 14    End of Session Equipment Utilized During Treatment: Gait belt Activity Tolerance: Patient tolerated treatment well;Patient limited by pain Patient left: in bed;with call bell/phone within reach;with nursing/sitter in room Nurse Communication: Mobility status PT Visit Diagnosis: Unsteadiness on feet (R26.81);Pain Pain - Right/Left: Right Pain - part of body: Knee    Time: 1700-1730 PT Time Calculation (min) (ACUTE ONLY): 30 min   Charges:   PT  Evaluation $PT Eval Low Complexity: 1 Low PT Treatments $Gait Training: 8-22 mins        Reinaldo Berber, PT, DPT Acute Rehab Services Pager: [phone]    Reinaldo Berber 04/13/2018, 5:27 PM

## 2018-04-13 NOTE — Progress Notes (Signed)
Orthopedic Tech Progress Note Patient Details:  Jared [name] November 17, 1950 [id number]  CPM Right Knee CPM Right Knee: On Right Knee Flexion (Degrees): 90 Right Knee Extension (Degrees): 0 Additional Comments: trapeze bar patient helper Viewed order from doctor's order list Post Interventions Patient Tolerated: Well Instructions Provided: Care of device  Hildred Priest 04/13/2018, 1:44 PM

## 2018-04-13 NOTE — Plan of Care (Signed)
  Problem: Activity: Goal: Risk for activity intolerance will decrease Outcome: Progressing   Problem: Pain Managment: Goal: General experience of comfort will improve Outcome: Progressing   

## 2018-04-13 NOTE — Anesthesia Postprocedure Evaluation (Signed)
Anesthesia Post Note  Patient: Jared [name]  Procedure(s) Performed: RIGHT TOTAL KNEE ARTHROPLASTY (Right Knee)     Patient location during evaluation: PACU Anesthesia Type: Spinal Level of consciousness: oriented and awake and alert Pain management: pain level controlled Vital Signs Assessment: post-procedure vital signs reviewed and stable Respiratory status: spontaneous breathing, respiratory function stable and patient connected to nasal cannula oxygen Cardiovascular status: blood pressure returned to baseline and stable Postop Assessment: no headache, no backache and no apparent nausea or vomiting Anesthetic complications: no    Last Vitals:  Vitals:   04/13/18 1300 04/13/18 1315  BP: 127/74 131/75  Pulse: 71 75  Resp: 14 17  Temp:    SpO2: 98% 95%    Last Pain:  Vitals:   04/13/18 1246  TempSrc:   PainSc: 0-No pain    LLE Motor Response: Purposeful movement;Responds to sound (04/13/18 1315) LLE Sensation: Decreased(spinal block) (04/13/18 1315) RLE Motor Response: Purposeful movement;Responds to commands (04/13/18 1315) RLE Sensation: Decreased(spinal and regional block) (04/13/18 1315) L Sensory Level: S1-Sole of foot, small toes (04/13/18 1315) R Sensory Level: S1-Sole of foot, small toes (04/13/18 1315)  Barnet Glasgow

## 2018-04-13 NOTE — Op Note (Signed)
NAME: Jared [name], Jared J. MEDICAL RECORD [id number] ACCOUNT [id number] DATE OF BIRTH:09-Mar-1951 FACILITY: [facility] LOCATION: [facility] PHYSICIAN:STEVEN Orlena Sheldon, MD  OPERATIVE REPORT  DATE OF PROCEDURE:  04/13/2018  PREOPERATIVE DIAGNOSIS:  Right knee end-stage osteoarthritis.  POSTOPERATIVE DIAGNOSIS:  Right knee end-stage osteoarthritis.  PROCEDURE PERFORMED:  Right total knee replacement using DePuy Sigma rotating platform prosthesis.  ATTENDING SURGEON:  Esmond Plants, MD  ASSISTANT:  Darol Destine, [place], who was scrubbed during the entire procedure and necessary for satisfactory completion of surgery.  ANESTHESIA:  Spinal anesthesia plus adductor canal block was used.  ESTIMATED BLOOD LOSS:  Minimal.  FLUID REPLACEMENT:  1500 mL crystalloid.  COUNTS:  Instrument counts correct.  COMPLICATIONS:  None.  ANTIBIOTICS:  Perioperative antibiotics were given.  INDICATIONS:  The patient is a 67 year old male with worsening right knee pain following knee arthroscopy for a meniscal tear.  The patient had progressive pain in his knee following his surgery and developed interval loss of joint space on standing  x-rays.  The patient also developed a subchondral stress fracture in the femoral condyle.  Due to progressive degenerative changes in the medial compartment interfering with patient's ability to walk and perform ADLs, the patient presents now for total  knee arthroplasty to restore mechanical alignment and restore pain-free bearing in his knee to allow for improved quality of life and also to eliminate his pain.  Risks and benefits of surgical management were discussed in detail with the patient.   Informed consent was obtained.  DESCRIPTION OF PROCEDURE:  After an adequate level of anesthesia was achieved, the patient was positioned in the supine position, right leg correctly identified, sterilely prepped and draped in the usual manner.  Timeout was called.  We placed a   nonsterile tourniquet on the proximal thigh prior to prepping and draping and timeout.  Once we verified correct patient, correct site, we elevated the leg, exsanguinated using Esmarch bandage and elevated the tourniquet to 300 mmHg.  Longitudinal  midline incision was created with the knee in flexion.  Using a 10-blade scalpel, dissection down through the subcutaneous tissues using the scalpel blade.  We identified the median parapatellar tissues and performed a parapatellar arthrotomy with a  fresh 10-blade scalpel.  We divided the lateral patellofemoral ligaments.  We everted the patella, entering the distal femur with a step-cut drill.  We then placed an intramedullary resection guide and resected 10 mm off the distal femur set on 5 degrees  right.  There was a complete loss of cartilage in the medial compartment.  Lateral compartment showed some chondromalacia.  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  We then measured our femur, sized it to a size 4 anterior down and  performed our anterior, posterior and chamfer cuts with a 4-in-1 block.  We resected ACL, PCL, meniscal tissues, subluxed the tibia anteriorly, and performed our tibial cut 90 degrees perpendicular to the long axis of the tibia with minimal posterior  slope for this posterior cruciate substituting prosthesis.  We took 4 mm off the affected medial side.  We then removed excess posterior osteophytes off the femur with a lamina spreader and then sized and checked our gaps, which were symmetric at 10 mm  for both flexion and extension.  We then completed our tibial preparation, sizing to a size 4 and then doing a modular drill and keel punch for the tibia and then impacted our trial tibial component.  We then directed our attention towards the femur.  We  performed our  box cut for the posterior cruciate substituting prosthesis, and we did a size 4 narrow.  We had excellent fit with the implant and then reduced with a size  4+10 poly.  We had nice extension; in fact, a little bit of hyperextension.  It  felt like we could probably get a 12.5 in.  We then resurfaced the patella, going from 23 mm thickness down to 16 mm of thickness and cut using the patellar jig.  We then drilled our lug holes for the 38 patella.  We placed a 38 trial patella in place,  ranged the knee, had excellent tracking with no-touch technique.  We removed all trial components, pulse irrigated the knee, and dried the bone well and then cemented the components into place with high viscosity cement by DePuy, vacuum mixed and placed  in compression.  Once we had our 4 tibia, 4 femur, and then the 4 size 10 poly in place, we placed the knee in extension and then used a patellar clamp to compress the 38 patellar button in place.  Once the cement hardened, we removed excess cement with  1/4-inch curved osteotome.  We ranged the knee, had excellent range of motion.  We felt like we could get the 12.5 in, so we selected the real size 4, 12.5 mm poly and inserted that in place.  We then reduced the knee.  It had a nice little pop there  medially as we were reducing it, which gave [place] a good feeling for the tension.  The knee was nice and stable, well aligned throughout a full arc of motion.  Patellar tracking was excellent.  We irrigated thoroughly and then closed the parapatellar  arthrotomy with #1 Vicryl suture interrupted, followed by 2-0 Vicryl for subcutaneous closure and 4-0 Monocryl for skin.  Steri-Strips applied, followed by a sterile dressing.  The patient tolerated surgery well.  LN/NUANCE  D:04/13/2018 T:04/13/2018 JOB:[id number]

## 2018-04-13 NOTE — Anesthesia Procedure Notes (Signed)
Spinal  Patient location during procedure: OB Start time: 04/13/2018 10:12 AM End time: 04/13/2018 10:19 AM Staffing Anesthesiologist: Barnet Glasgow, MD Performed: anesthesiologist  Preanesthetic Checklist Completed: patient identified, surgical consent, pre-op evaluation, timeout performed, IV checked, risks and benefits discussed and monitors and equipment checked Spinal Block Patient position: sitting Prep: site prepped and draped and DuraPrep Patient monitoring: heart rate, cardiac monitor, continuous pulse ox and blood pressure Approach: midline Location: L3-4 Injection technique: single-shot Needle Needle type: Pencan  Needle gauge: 24 G Needle length: 10 cm Assessment Sensory level: T4

## 2018-04-14 LAB — CBC
HCT: 39.1 % (ref 39.0–52.0)
Hemoglobin: 12.3 g/dL — ABNORMAL LOW (ref 13.0–17.0)
MCH: 27.2 pg (ref 26.0–34.0)
MCHC: 31.5 g/dL (ref 30.0–36.0)
MCV: 86.5 fL (ref 78.0–100.0)
Platelets: 272 10*3/uL (ref 150–400)
RBC: 4.52 MIL/uL (ref 4.22–5.81)
RDW: 13.9 % (ref 11.5–15.5)
WBC: 9.6 10*3/uL (ref 4.0–10.5)

## 2018-04-14 LAB — BASIC METABOLIC PANEL
Anion gap: 10 (ref 5–15)
BUN: 18 mg/dL (ref 8–23)
CO2: 26 mmol/L (ref 22–32)
Calcium: 8.5 mg/dL — ABNORMAL LOW (ref 8.9–10.3)
Chloride: 101 mmol/L (ref 98–111)
Creatinine, Ser: 1.42 mg/dL — ABNORMAL HIGH (ref 0.61–1.24)
GFR calc Af Amer: 57 mL/min — ABNORMAL LOW (ref >60)
GFR calc non Af Amer: 49 mL/min — ABNORMAL LOW (ref >60)
Glucose, Bld: 125 mg/dL — ABNORMAL HIGH (ref 70–99)
Potassium: 4.2 mmol/L (ref 3.5–5.1)
Sodium: 137 mmol/L (ref 135–145)

## 2018-04-14 NOTE — Progress Notes (Signed)
NIALL ILLES  MRN: [id number] DOB/Age: Apr 12, 1951 67 y.o. [facility] Procedure: Procedure(s) (LRB): RIGHT TOTAL KNEE ARTHROPLASTY (Right)     Subjective: Had a rough night and difficulty with pain control this am  Vital Signs Temp:  [97.5 F (36.4 C)-98.8 F (37.1 C)] 98.8 F (37.1 C) (07/27 0635) Pulse Rate:  [66-94] 91 (07/27 0635) Resp:  [0-18] 13 (07/26 1400) BP: (102-143)/(64-87) 143/79 (07/27 0635) SpO2:  [90 %-100 %] 90 % (07/27 0635)  Lab Results Recent Labs    04/14/18 0310  WBC 9.6  HGB 12.3*  HCT 39.1  PLT 272   BMET Recent Labs    04/14/18 0310  NA 137  K 4.2  CL 101  CO2 26  GLUCOSE 125*  BUN 18  CREATININE 1.42*  CALCIUM 8.5*   INR  Date Value Ref Range Status  08/23/2014 0.97 0.00 - 1.49 Final     Exam Moving LE well.\ NVI        Plan Continue pain control and therapies May need to modify his oral analgesics if not improved later, IV dilaudid just given  Jenetta Loges PA-C  04/14/2018, 9:57 AM Contact # [phone]

## 2018-04-14 NOTE — Progress Notes (Signed)
Physical Therapy Treatment Patient Details Name: Jared [name] MRN: [id number] DOB: 08-Mar-1951 Today's Date: 04/14/2018    History of Present Illness 67 y.o. male s/p R TKA 04/13/18. PMH includes: peripheral neuropathy, HTN, HLD, heart attack, GERD, CAD, cervical fusion, L TKA, back surgery.    PT Comments    Pt performed gait training and functional mobility this pm.  Post gait training patient is able to void, informed nursing of small amount.  Pt slow and guarded and returned to bed as he reports he is uncomfortable in the chair.  Pt placed in CPM at 0-90 degrees post return to supine.      Follow Up Recommendations  Follow surgeon's recommendation for DC plan and follow-up therapies;Home health PT     Equipment Recommendations  None recommended by PT    Recommendations for Other Services       Precautions / Restrictions Precautions Precautions: Knee;Fall Precaution Booklet Issued: Yes (comment) Precaution Comments: revewied supine therex and no pillow under knee Restrictions Weight Bearing Restrictions: Yes RLE Weight Bearing: Weight bearing as tolerated Other Position/Activity Restrictions: WBAT    Mobility  Bed Mobility Overal bed mobility: Needs Assistance Bed Mobility: Sit to Supine     Sit to supine: Supervision   General bed mobility comments: Pt with improved function returning to bed and required supervision for safety.  VCs for positioning once in supine.    Transfers Overall transfer level: Needs assistance Equipment used: Rolling walker (2 wheeled) Transfers: Sit to/from Stand Sit to Stand: Min assist         General transfer comment: Cues for hand placement.  Pt slow to ascend and required min assistance to achieve standing and he was unscuccesful with supervision.    Ambulation/Gait Ambulation/Gait assistance: Min guard Gait Distance (Feet): 200 Feet Assistive device: Rolling walker (2 wheeled) Gait Pattern/deviations: Step-to  pattern;Antalgic;Step-through pattern;Trunk flexed Gait velocity: decreased   General Gait Details: Pt able to progress to step through pattern, cues for increasing L stride. Pt with improved cadence this pm.     Stairs             Wheelchair Mobility    Modified Rankin (Stroke Patients Only)       Balance Overall balance assessment: Needs assistance Sitting-balance support: Single extremity supported Sitting balance-Leahy Scale: Good       Standing balance-Leahy Scale: Fair Standing balance comment: reliant on RW due to post op pain                            Cognition Arousal/Alertness: Awake/alert Behavior During Therapy: WFL for tasks assessed/performed Overall Cognitive Status: Within Functional Limits for tasks assessed                                        Exercises Total Joint Exercises Ankle Circles/Pumps: AROM;Both;10 reps;Supine Quad Sets: AROM;Right;10 reps;Supine Heel Slides: AAROM;Right;10 reps;Supine    General Comments        Pertinent Vitals/Pain Pain Assessment: 0-10 Pain Score: 7  Pain Location: R knee Pain Descriptors / Indicators: Operative site guarding;Aching Pain Intervention(s): Monitored during session;Repositioned;Ice applied    Home Living                      Prior Function            PT Goals (current goals can  now be found in the care plan section) Acute Rehab PT Goals Patient Stated Goal: go home on sunday Potential to Achieve Goals: Good Progress towards PT goals: Progressing toward goals    Frequency    7X/week      PT Plan Current plan remains appropriate    Co-evaluation              AM-PAC PT "6 Clicks" Daily Activity  Outcome Measure  Difficulty turning over in bed (including adjusting bedclothes, sheets and blankets)?: Unable Difficulty moving from lying on back to sitting on the side of the bed? : Unable Difficulty sitting down on and standing up from  a chair with arms (e.g., wheelchair, bedside commode, etc,.)?: Unable Help needed moving to and from a bed to chair (including a wheelchair)?: A Little Help needed walking in hospital room?: A Little Help needed climbing 3-5 steps with a railing? : A Little 6 Click Score: 12    End of Session Equipment Utilized During Treatment: Gait belt Activity Tolerance: Patient tolerated treatment well;Patient limited by pain Patient left: in bed;with call bell/phone within reach;with nursing/sitter in room Nurse Communication: Mobility status PT Visit Diagnosis: Unsteadiness on feet (R26.81);Pain Pain - Right/Left: Right Pain - part of body: Knee     Time: 1339-1411 PT Time Calculation (min) (ACUTE ONLY): 32 min  Charges:  $Gait Training: 8-22 mins $Therapeutic Activity: 8-22 mins                     Governor Rooks, PTA pager [phone]    Cristela Blue 04/14/2018, 2:20 PM

## 2018-04-14 NOTE — Progress Notes (Signed)
Physical Therapy Treatment Patient Details Name: Jared [name] MRN: [id number] DOB: 1951/08/08 Today's Date: 04/14/2018    History of Present Illness 67 y.o. male s/p R TKA 04/13/18. PMH includes: peripheral neuropathy, HTN, HLD, heart attack, GERD, CAD, cervical fusion, L TKA, back surgery.    PT Comments    Pt performed gait training and functional mobility with increased time and effort.  Pt slow and guarded due to pain but remains motivated.  Follow up in pm for further progression of mobility.     Follow Up Recommendations  Follow surgeon's recommendation for DC plan and follow-up therapies;Home health PT     Equipment Recommendations  None recommended by PT    Recommendations for Other Services       Precautions / Restrictions Precautions Precautions: Knee;Fall Precaution Booklet Issued: Yes (comment) Precaution Comments: revewied supine therex and no pillow under knee Restrictions Weight Bearing Restrictions: Yes RLE Weight Bearing: Weight bearing as tolerated Other Position/Activity Restrictions: WBAT    Mobility  Bed Mobility Overal bed mobility: Needs Assistance Bed Mobility: Supine to Sit     Supine to sit: Mod assist     General bed mobility comments: Cues for hand placement, assistance for LE advancement and upper trunk elevation into sitting.    Transfers Overall transfer level: Needs assistance Equipment used: Rolling walker (2 wheeled) Transfers: Sit to/from Stand Sit to Stand: Min guard         General transfer comment: Cues for hand placement to and from seated surface.  Pt slow and required increased time and effort to achieve standing.  Decreased R knee flexion with return to seated position.    Ambulation/Gait Ambulation/Gait assistance: Min guard Gait Distance (Feet): 80 Feet Assistive device: Rolling walker (2 wheeled) Gait Pattern/deviations: Step-to pattern;Antalgic     General Gait Details: Pt with slow cadence and cues for  step by step instruction for gait sequencing.  Pt slow and guarded due to pain.     Stairs             Wheelchair Mobility    Modified Rankin (Stroke Patients Only)       Balance Overall balance assessment: Needs assistance Sitting-balance support: Single extremity supported         Standing balance-Leahy Scale: Fair                              Cognition Arousal/Alertness: Awake/alert Behavior During Therapy: WFL for tasks assessed/performed Overall Cognitive Status: Within Functional Limits for tasks assessed                                        Exercises Total Joint Exercises Ankle Circles/Pumps: AROM;Both;10 reps;Supine Quad Sets: AROM;Right;10 reps;Supine Heel Slides: AAROM;Right;10 reps;Supine    General Comments        Pertinent Vitals/Pain Pain Assessment: 0-10 Pain Score: 7  Pain Location: R knee Pain Descriptors / Indicators: Operative site guarding;Aching Pain Intervention(s): Monitored during session;Repositioned;Ice applied    Home Living                      Prior Function            PT Goals (current goals can now be found in the care plan section) Acute Rehab PT Goals Patient Stated Goal: go home on sunday Potential to Achieve Goals: Good Progress  towards PT goals: Progressing toward goals    Frequency    7X/week      PT Plan Current plan remains appropriate    Co-evaluation              AM-PAC PT "6 Clicks" Daily Activity  Outcome Measure  Difficulty turning over in bed (including adjusting bedclothes, sheets and blankets)?: Unable Difficulty moving from lying on back to sitting on the side of the bed? : Unable Difficulty sitting down on and standing up from a chair with arms (e.g., wheelchair, bedside commode, etc,.)?: Unable Help needed moving to and from a bed to chair (including a wheelchair)?: A Little Help needed walking in hospital room?: A Little Help needed  climbing 3-5 steps with a railing? : A Little 6 Click Score: 12    End of Session Equipment Utilized During Treatment: Gait belt Activity Tolerance: Patient tolerated treatment well;Patient limited by pain Patient left: in bed;with call bell/phone within reach;with nursing/sitter in room Nurse Communication: Mobility status PT Visit Diagnosis: Unsteadiness on feet (R26.81);Pain Pain - Right/Left: Right Pain - part of body: Knee     Time: 1120-1159 PT Time Calculation (min) (ACUTE ONLY): 39 min  Charges:  $Gait Training: 8-22 mins $Therapeutic Exercise: 8-22 mins $Therapeutic Activity: 8-22 mins                     Governor Rooks, PTA pager [phone]    Cristela Blue 04/14/2018, 12:07 PM

## 2018-04-15 ENCOUNTER — Encounter (HOSPITAL_COMMUNITY): Payer: Self-pay | Admitting: *Deleted

## 2018-04-15 LAB — CBC
HCT: 36 % — ABNORMAL LOW (ref 39.0–52.0)
Hemoglobin: 11.6 g/dL — ABNORMAL LOW (ref 13.0–17.0)
MCH: 27.6 pg (ref 26.0–34.0)
MCHC: 32.2 g/dL (ref 30.0–36.0)
MCV: 85.5 fL (ref 78.0–100.0)
Platelets: 227 10*3/uL (ref 150–400)
RBC: 4.21 MIL/uL — ABNORMAL LOW (ref 4.22–5.81)
RDW: 13.7 % (ref 11.5–15.5)
WBC: 10.5 10*3/uL (ref 4.0–10.5)

## 2018-04-15 NOTE — Discharge Summary (Signed)
Orthopedic Discharge Summary        Physician Discharge Summary  Patient ID: Jared [name] MRN: [id number] DOB/AGE: 1950-12-24 67 y.o.  Admit date: 04/13/2018 Discharge date: 04/15/2018   Procedures:  Procedure(s) (LRB): RIGHT TOTAL KNEE ARTHROPLASTY (Right)  Attending Physician:  Dr. Esmond Plants  Admission Diagnoses:   Right knee end stage OA  Discharge Diagnoses:  Right knee end stage OA   Past Medical History:  Diagnosis Date  . Cervical vertebral fusion 05/02/2014   Now presenting with C5-6 radiculopathy  . Coronary artery disease   . Depression   . Depression, prolonged 06/26/2013  . Diverticulosis   . GERD (gastroesophageal reflux disease)   . Heart attack ([place]) 2009  . History of esophageal stricture   . Hyperlipidemia   . Hypertension   . IBS (irritable bowel syndrome)   . Internal hemorrhoids   . Osteoarthritis   . 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 Peripheral neuropathy   . Renal cell carcinoma 1997   Left kidney  . Shoulder pain, right   . Sleep apnea    wears CPAP  . Sleep talking   . Snoring 12/25/2013  . Wears glasses     PCP: Burnard Bunting, MD   Discharged Condition: good  Hospital Course:  Patient underwent the above stated procedure on 04/13/2018. Patient tolerated the procedure well and brought to the recovery room in good condition and subsequently to the floor. Patient had an uncomplicated hospital course and was stable for discharge.   Disposition: Discharge disposition: 01-Home or Self Care      with follow up in 2 weeks   Follow-up Information    Netta Cedars, MD. Call in 2 weeks.   Specialty:  Orthopedic Surgery Why:  [phone] Contact information: [address] [place] [address] [phone]           Discharge Instructions    Call MD / Call 911   Complete by:  As directed    If you experience chest pain or shortness of breath, CALL 911 and be transported to the  hospital emergency room.  If you develope a fever above 101 F, pus (white drainage) or increased drainage or redness at the wound, or calf pain, call your surgeon's office.   Constipation Prevention   Complete by:  As directed    Drink plenty of fluids.  Prune juice may be helpful.  You may use a stool softener, such as Colace (over the counter) 100 mg twice a day.  Use MiraLax (over the counter) for constipation as needed.   Diet - low sodium heart healthy   Complete by:  As directed    Increase activity slowly as tolerated   Complete by:  As directed       Allergies as of 04/15/2018      Reactions   Contrast Media [iodinated Diagnostic Agents] Other (See Comments)   Was told not to take d/t pt only having 1 kidney   Nsaids Other (See Comments)   Was told not to take d/t pt only having 1 kidney   Penicillins Hives, Itching, Other (See Comments)   Has patient had a PCN reaction causing immediate rash, facial/tongue/throat swelling, SOB or lightheadedness with hypotension: Unknown Has patient had a PCN reaction causing severe rash involving mucus membranes or skin necrosis: Unknown Has patient had a PCN reaction that required hospitalization: Unknown Has patient had a PCN reaction occurring within the last 10 years: No If all of  the above answers are "NO", then may proceed with Cephalosporin use.   Sulfonamide Derivatives Hives, Itching   Codeine Nausea Only   Statins Other (See Comments)   Myalgias      Medication List    STOP taking these medications   AEROCHAMBER PLUS inhaler   clindamycin 1 % external solution Commonly known as:  CLEOCIN T   mometasone-formoterol 200-5 MCG/ACT Aero Commonly known as:  DULERA     TAKE these medications   ARIPiprazole 20 MG tablet Commonly known as:  ABILIFY Take 20 mg by mouth daily.   Armodafinil 250 MG tablet Take 250 mg by mouth daily.   aspirin 81 MG tablet Take 1 tablet (81 mg total) by mouth daily. What changed:  Another  medication with the same name was added. Make sure you understand how and when to take each.   aspirin 81 MG chewable tablet Commonly known as:  ASPIRIN CHILDRENS Chew 1 tablet (81 mg total) by mouth 2 (two) times daily. What changed:  You were already taking a medication with the same name, and this prescription was added. Make sure you understand how and when to take each.   BELSOMRA 20 MG Tabs Generic drug:  Suvorexant Take 20 mg by mouth at bedtime.   DULoxetine 60 MG capsule Commonly known as:  CYMBALTA Take 60 mg by mouth daily.   ezetimibe 10 MG tablet Commonly known as:  ZETIA Take 1 tablet (10 mg total) by mouth daily. NEED OV.   finasteride 5 MG tablet Commonly known as:  PROSCAR Take 5 mg by mouth daily.   fish oil-omega-3 fatty acids 1000 MG capsule Take 1 g by mouth daily.   FLOVENT HFA 110 MCG/ACT inhaler Generic drug:  fluticasone inhale 2 puffs INTO LUNGS twice a day What changed:  See the new instructions.   gemfibrozil 600 MG tablet Commonly known as:  LOPID Take 600 mg by mouth 2 (two) times daily.   hydrOXYzine 50 MG tablet Commonly known as:  ATARAX/VISTARIL Take 50 mg by mouth at bedtime   ipratropium-albuterol 0.5-2.5 (3) MG/3ML Soln Commonly known as:  DUONEB Take 3 mLs by nebulization every 6 (six) hours as needed. What changed:  reasons to take this   isosorbide mononitrate 30 MG 24 hr tablet Commonly known as:  IMDUR take 1 tablet by mouth once daily What changed:    how much to take  how to take this  when to take this   levothyroxine 25 MCG tablet Commonly known as:  SYNTHROID, LEVOTHROID Take 25 mcg by mouth daily before breakfast.   methocarbamol 500 MG tablet Commonly known as:  ROBAXIN Take 1 tablet (500 mg total) by mouth 3 (three) times daily as needed.   montelukast 10 MG tablet Commonly known as:  SINGULAIR Take 1 tablet (10 mg total) by mouth at bedtime.   multivitamin tablet Take 1 tablet by mouth daily.     nitroGLYCERIN 0.4 MG SL tablet Commonly known as:  NITROSTAT Place 1 tablet (0.4 mg total) under the tongue every 5 (five) minutes as needed for chest pain.   omeprazole 20 MG capsule Commonly known as:  PRILOSEC TAKE 1 CAPSULE BY MOUTH TWICE A DAY AS DIRECTED   oxyCODONE-acetaminophen 5-325 MG tablet Commonly known as:  PERCOCET Take 1-2 tablets by mouth every 4 (four) hours as needed for moderate pain or severe pain.   PROAIR HFA 108 (90 Base) MCG/ACT inhaler Generic drug:  albuterol inhale 2 puffs by mouth every 4 to  6 hours if needed for cough or wheezing   RAPAFLO 8 MG Caps capsule Generic drug:  silodosin Take 8 mg by mouth at bedtime.   vitamin C 500 MG tablet Commonly known as:  ASCORBIC ACID Take 500 mg by mouth 2 (two) times daily.   XOLAIR 150 MG injection Generic drug:  omalizumab INJECT 150MG  SUBCUTANEOUSLY EVERY 4 WEEKS (GIVEN AT PRESCRIBER OFFICE)         Signed: Augustin Schooling 04/15/2018, 8:24 AM  [facility] is now [address] [facility] [address] [place], [place], [place] [address] Phone: [place]

## 2018-04-15 NOTE — Progress Notes (Signed)
Patient complained of pain that was not controlled on PO medication. PT recommended one more day of therapy prior to discharge home. Dr. Stann Mainland notified and discharge order discontinued.   Dilaudid IV given at 1500 with relief. Patient declined suppository today, prefering to wait until tomorrow morning since he was not discharging today.

## 2018-04-15 NOTE — Progress Notes (Signed)
Orthopedics Progress Note  Subjective: Patient feeling better today, slept well last night  Objective:  Vitals:   04/14/18 2040 04/15/18 0649  BP: 133/75 132/87  Pulse: 90 100  Resp:    Temp: 98.7 F (37.1 C) 98.2 F (36.8 C)  SpO2: 90% 95%    General: Awake and alert  Musculoskeletal: Right knee incision clean and dry and intact, min swelling, good quad sets, No cords Neurovascularly intact  Lab Results  Component Value Date   WBC 10.5 04/15/2018   HGB 11.6 (L) 04/15/2018   HCT 36.0 (L) 04/15/2018   MCV 85.5 04/15/2018   PLT 227 04/15/2018       Component Value Date/Time   NA 137 04/14/2018 0310   K 4.2 04/14/2018 0310   CL 101 04/14/2018 0310   CO2 26 04/14/2018 0310   GLUCOSE 125 (H) 04/14/2018 0310   BUN 18 04/14/2018 0310   CREATININE 1.42 (H) 04/14/2018 0310   CREATININE 1.41 (H) 06/25/2013 1051   CALCIUM 8.5 (L) 04/14/2018 0310   GFRNONAA 49 (L) 04/14/2018 0310   GFRAA 57 (L) 04/14/2018 0310    Lab Results  Component Value Date   INR 0.97 08/23/2014   INR 0.96 03/17/2010   INR 0.9 05/07/2009    Assessment/Plan: POD #2 s/p Procedure(s): RIGHT TOTAL KNEE ARTHROPLASTY Discharge to home after home health arranged and therapy and patient has a BM  Remo Lipps R. Veverly Fells, MD 04/15/2018 8:20 AM

## 2018-04-15 NOTE — Progress Notes (Signed)
Physical Therapy Treatment Patient Details Name: Jared [name] MRN: [id number] DOB: [birth year] Today's Date: 04/15/2018    History of Present Illness 67 y.o. male s/p R TKA 04/13/18. PMH includes: peripheral neuropathy, HTN, HLD, heart attack, GERD, CAD, cervical fusion, L TKA, back surgery.    PT Comments    Pt presents with increased pain today and concerned about going home.  Does not feel he is able to manage at home with spouse/intermittent supervision, spouse at bedside and agrees.  Pain 7/10 after premedication.  Recommend reassess for d/c next date and attempt stair training prior to clearance for home, pt and spouse agree.      Follow Up Recommendations  Follow surgeon's recommendation for DC plan and follow-up therapies(HHPT set up per notes)     Equipment Recommendations  3in1 (PT);Other (comment)(needs shower bench/tub transfer bench)    Recommendations for Other Services       Precautions / Restrictions Precautions Precautions: Knee;Fall Precaution Comments: no pillow under knee, use ice Required Braces or Orthoses: Knee Immobilizer - Right Knee Immobilizer - Right: Other (comment)(on at night/in bed when not in CPM) Restrictions RLE Weight Bearing: Weight bearing as tolerated    Mobility  Bed Mobility               General bed mobility comments: up in/back to chair  Transfers Overall transfer level: Needs assistance Equipment used: Rolling walker (2 wheeled) Transfers: Sit to/from Stand Sit to Stand: Min guard         General transfer comment: cues for hands, slide out to [place] first; able to perform with standby today  Ambulation/Gait Ambulation/Gait assistance: Supervision Gait Distance (Feet): 150 Feet Assistive device: Rolling walker (2 wheeled) Gait Pattern/deviations: Step-to pattern;Step-through pattern;Decreased dorsiflexion - right;Decreased weight shift to right;Antalgic Gait velocity: decreased   General Gait Details: pt  recalled heel strike cue for RLE, able to progress over time to more step-through though short stride and more continuous flow with RW   Stairs             Wheelchair Mobility    Modified Rankin (Stroke Patients Only)       Balance Overall balance assessment: Needs assistance Sitting-balance support: Feet supported;No upper extremity supported Sitting balance-Leahy Scale: Good     Standing balance support: Bilateral upper extremity supported;During functional activity Standing balance-Leahy Scale: Fair Standing balance comment: depends on RW due to TKA                            Cognition Arousal/Alertness: Awake/alert Behavior During Therapy: WFL for tasks assessed/performed Overall Cognitive Status: Within Functional Limits for tasks assessed                                 General Comments: alert but a bit slow processing -- suspect due to med      Exercises Total Joint Exercises Quad Sets: AROM;Strengthening;Right;10 reps;Seated;Standing Short Arc Quad: AROM;Right;5 reps;Seated Long Arc Quad: AAROM;AROM;Right;10 reps;Seated Knee Flexion: AAROM;Right;5 reps;Seated Goniometric ROM: obs [id number]    General Comments        Pertinent Vitals/Pain Pain Assessment: 0-10 Pain Score: 7  Pain Location: R knee Pain Descriptors / Indicators: Operative site guarding;Aching Pain Intervention(s): Limited activity within patient's tolerance;Monitored during session;Premedicated before session;Repositioned;Ice applied    Home Living  Prior Function            PT Goals (current goals can now be found in the care plan section) Acute Rehab PT Goals Patient Stated Goal: control pain Progress towards PT goals: Progressing toward goals    Frequency    7X/week      PT Plan Current plan remains appropriate    Co-evaluation              AM-PAC PT "6 Clicks" Daily Activity  Outcome  Measure  Difficulty turning over in bed (including adjusting bedclothes, sheets and blankets)?: A Lot Difficulty moving from lying on back to sitting on the side of the bed? : A Lot Difficulty sitting down on and standing up from a chair with arms (e.g., wheelchair, bedside commode, etc,.)?: A Little Help needed moving to and from a bed to chair (including a wheelchair)?: A Little Help needed walking in hospital room?: None Help needed climbing 3-5 steps with a railing? : A Little 6 Click Score: 17    End of Session Equipment Utilized During Treatment: Gait belt Activity Tolerance: Patient tolerated treatment well;No increased pain;Patient limited by fatigue Patient left: in chair;with call bell/phone within reach;with family/visitor present Nurse Communication: Mobility status PT Visit Diagnosis: Unsteadiness on feet (R26.81);Pain Pain - Right/Left: Right Pain - part of body: Knee     Time: 0932-3557 PT Time Calculation (min) (ACUTE ONLY): 30 min  Charges:  $Gait Training: 8-22 mins $Therapeutic Exercise: 8-22 mins                     Kearney Hard, PT, DPT, MS Board Certified Geriatric Clinical Specialist   Jared [name] 04/15/2018, 12:25 PM

## 2018-04-15 NOTE — Care Management Note (Signed)
Case Management Note  Patient Details  Name: BRENTYN SEEHAFER MRN: [id number] Date of Birth: [birth year]  Subjective/Objective:                 Patient pre operatively arranged with [facility] for [facility]. Notified Monia Pouch, clinical liaison. No other CM needs   Action/Plan:   Expected Discharge Date:  04/15/18               Expected Discharge Plan:  [place]  In-House Referral:     Discharge planning Services  CM Consult  Post Acute Care Choice:  Home Health Choice offered to:  Patient  DME Arranged:    DME Agency:     HH Arranged:  OT, PT, Nurse's Aide [place] Agency:  [facility] (formerly [employer])  Status of Service:  Completed, signed off  If discussed at [employer], dates discussed:    Additional Comments:  Carles Collet, RN 04/15/2018, 9:43 AM

## 2018-04-15 NOTE — Progress Notes (Signed)
Physical Therapy Treatment Patient Details Name: Jared [name] MRN: [id number] DOB: 11/09/1950 Today's Date: 04/15/2018    History of Present Illness 67 y.o. male s/p R TKA 04/13/18. PMH includes: peripheral neuropathy, HTN, HLD, heart attack, GERD, CAD, cervical fusion, L TKA, back surgery.    PT Comments    Pt remains in moderate pain this pm session, agreeable to work with therapy, and spouse at bedside throughout.  Able to ascend 1 step, will need to practice 3-5 prior to home.  Hope for d/c tomorrow.   Follow Up Recommendations  Follow surgeon's recommendation for DC plan and follow-up therapies     Equipment Recommendations  3in1 (PT);Other (comment)(shower seat or tub transfer)    Recommendations for Other Services       Precautions / Restrictions Precautions Precautions: Knee;Fall Precaution Comments: no pillow under knee, use ice Required Braces or Orthoses: Knee Immobilizer - Right Knee Immobilizer - Right: Other (comment)(at night when not in CPM) Restrictions Weight Bearing Restrictions: Yes RLE Weight Bearing: Weight bearing as tolerated    Mobility  Bed Mobility Overal bed mobility: Needs Assistance Bed Mobility: Sit to Supine     Supine to sit: Min guard Sit to supine: Supervision   General bed mobility comments: from flat bed, pt encouraged to do as much independent; prefer to right EOB, cues to try rolling then sitting up, struggles with scooting out to EOB until cues to reciprocal scoot with brief assist to guide lean and pelvic advance; to supine by hooking op leg with intact leg  Transfers Overall transfer level: Needs assistance Equipment used: Rolling walker (2 wheeled) Transfers: Sit to/from Stand Sit to Stand: Supervision         General transfer comment: from bed at home height, places hands appropriately, slow to rise but steady and unassisted  Ambulation/Gait Ambulation/Gait assistance: Supervision Gait Distance (Feet): 150  Feet Assistive device: Rolling walker (2 wheeled) Gait Pattern/deviations: Step-to pattern;Step-through pattern;Decreased dorsiflexion - right;Decreased weight shift to right;Antalgic Gait velocity: decreased   General Gait Details: deferred gait to practice step up   Stairs Stairs: Yes Stairs assistance: Min assist Stair Management: No rails;Forwards;Backwards;With walker;Step to pattern Number of Stairs: 1(used platform step in room) General stair comments: demonstrated and instructed on fwd ascent, backward descent, able to perform unassisted but awkward in room and spouse anxious; NEXT SESSION: take in chair to gym and practice -- pt has 3-5 steps to enter AND 1-2 steps to access bedroom   Wheelchair Mobility    Modified Rankin (Stroke Patients Only)       Balance Overall balance assessment: Needs assistance Sitting-balance support: Feet supported;No upper extremity supported Sitting balance-Leahy Scale: Good     Standing balance support: Bilateral upper extremity supported;During functional activity Standing balance-Leahy Scale: Fair Standing balance comment: depends on RW due to TKA                            Cognition Arousal/Alertness: Awake/alert Behavior During Therapy: WFL for tasks assessed/performed Overall Cognitive Status: Within Functional Limits for tasks assessed                                 General Comments: same as earlier today, a bit slow to process, flat affect, suspect due to meds      Exercises Total Joint Exercises Quad Sets: AROM;Strengthening;Right;10 reps;Seated;Standing Short Arc Quad: AROM;Right;5 reps;Seated Long Arc Quad: AAROM;AROM;Right;10  reps;Seated Knee Flexion: AAROM;Right;5 reps;Seated Goniometric ROM: obs [id number]    General Comments        Pertinent Vitals/Pain Pain Assessment: 0-10 Pain Score: 7  Pain Location: R knee Pain Descriptors / Indicators: Operative site  guarding;Aching Pain Intervention(s): Limited activity within patient's tolerance;Monitored during session;Patient requesting pain meds-RN notified;Ice applied;Repositioned    Home Living                      Prior Function            PT Goals (current goals can now be found in the care plan section) Acute Rehab PT Goals Patient Stated Goal: control pain Progress towards PT goals: Progressing toward goals    Frequency    7X/week      PT Plan Current plan remains appropriate    Co-evaluation              AM-PAC PT "6 Clicks" Daily Activity  Outcome Measure  Difficulty turning over in bed (including adjusting bedclothes, sheets and blankets)?: A Lot Difficulty moving from lying on back to sitting on the side of the bed? : A Lot Difficulty sitting down on and standing up from a chair with arms (e.g., wheelchair, bedside commode, etc,.)?: A Little Help needed moving to and from a bed to chair (including a wheelchair)?: A Little Help needed walking in hospital room?: None Help needed climbing 3-5 steps with a railing? : A Little 6 Click Score: 17    End of Session Equipment Utilized During Treatment: Gait belt Activity Tolerance: Patient tolerated treatment well;Patient limited by fatigue Patient left: in bed;with call bell/phone within reach;with nursing/sitter in room;with family/visitor present Nurse Communication: Mobility status;Patient requests pain meds PT Visit Diagnosis: Unsteadiness on feet (R26.81);Pain Pain - Right/Left: Right Pain - part of body: Knee     Time: 5320-2334 PT Time Calculation (min) (ACUTE ONLY): 30 min  Charges:  $Gait Training: 8-22 mins $Therapeutic Exercise: 8-22 mins $Therapeutic Activity: 8-22 mins                     Kearney Hard, PT, DPT, MS Board Certified Geriatric Clinical Specialist   Herbie Drape 04/15/2018, 3:13 PM

## 2018-04-16 LAB — CBC
HCT: 35.3 % — ABNORMAL LOW (ref 39.0–52.0)
Hemoglobin: 11.5 g/dL — ABNORMAL LOW (ref 13.0–17.0)
MCH: 27.6 pg (ref 26.0–34.0)
MCHC: 32.6 g/dL (ref 30.0–36.0)
MCV: 84.9 fL (ref 78.0–100.0)
Platelets: 232 10*3/uL (ref 150–400)
RBC: 4.16 MIL/uL — ABNORMAL LOW (ref 4.22–5.81)
RDW: 14.1 % (ref 11.5–15.5)
WBC: 7.8 10*3/uL (ref 4.0–10.5)

## 2018-04-16 NOTE — Care Management Important Message (Signed)
Important Message  Patient Details  Name: Jared [name] MRN: [id number] Date of Birth: 09/14/1951   Medicare Important Message Given:  Yes    Orbie Pyo 04/16/2018, 3:12 PM

## 2018-04-16 NOTE — Progress Notes (Signed)
Physical Therapy Treatment Patient Details Name: Jared [name] MRN: [id number] DOB: [birth year] Today's Date: 04/16/2018    History of Present Illness 67 y.o. male s/p R TKA 04/13/18. PMH includes: peripheral neuropathy, HTN, HLD, heart attack, GERD, CAD, cervical fusion, L TKA, back surgery.    PT Comments    Pt ambulated 550' with RW at mod I level with increasing pace. Discussed activity level for home as well as reviewing proper positioning and there ex. Pt's equipment is in room, ready for d/c home. Follow up with PT as directed by surgeon. Acute PT signing off.   Follow Up Recommendations  Follow surgeon's recommendation for DC plan and follow-up therapies     Equipment Recommendations  3in1 (PT);Other (comment)(shower seat or tub transfer)    Recommendations for Other Services       Precautions / Restrictions Precautions Precautions: Knee;Fall Precaution Booklet Issued: No Precaution Comments: reviewed proper positioning Required Braces or Orthoses: Knee Immobilizer - Right Knee Immobilizer - Right: Other (comment)(at night when not in CPM) Restrictions Weight Bearing Restrictions: Yes RLE Weight Bearing: Weight bearing as tolerated Other Position/Activity Restrictions: WBAT    Mobility  Bed Mobility Overal bed mobility: Modified Independent Bed Mobility: Supine to Sit     Supine to sit: Modified independent (Device/Increase time)     General bed mobility comments: pt coming out of bathroom upon PT entry  Transfers Overall transfer level: Modified independent Equipment used: Rolling walker (2 wheeled) Transfers: Sit to/from Stand Sit to Stand: Modified independent (Device/Increase time)         General transfer comment: pt stood and sat to recliner with proper hand placement without cues  Ambulation/Gait Ambulation/Gait assistance: Modified independent (Device/Increase time) Gait Distance (Feet): 550 Feet Assistive device: Rolling walker (2  wheeled) Gait Pattern/deviations: Step-through pattern;Decreased stride length Gait velocity: decreased Gait velocity interpretation: >2.62 ft/sec, indicative of community ambulatory General Gait Details: pt able to increase pace with ambulation without any decreased safety. Reciprocal gait is progressing. Worked on allowing for R knee bend with swing thorugh   Stairs Stairs: Yes Stairs assistance: Min assist Stair Management: No rails;Forwards;With walker;Step to pattern;One rail Left Number of Stairs: 7 General stair comments: practiced with 1 rail as pt has going into home. Also practiced with wife's assist for steps into bedroom without rail. Pt safe with min A and wife present for training   Wheelchair Mobility    Modified Rankin (Stroke Patients Only)       Balance Overall balance assessment: Needs assistance Sitting-balance support: Feet supported;No upper extremity supported Sitting balance-Leahy Scale: Good     Standing balance support: Bilateral upper extremity supported;During functional activity Standing balance-Leahy Scale: Fair Standing balance comment: able to maintain static stance without UE support                            Cognition Arousal/Alertness: Awake/alert Behavior During Therapy: WFL for tasks assessed/performed Overall Cognitive Status: Within Functional Limits for tasks assessed                                 General Comments:  slow to process, flat affect      Exercises Total Joint Exercises Ankle Circles/Pumps: AROM;Both;10 reps;Supine Quad Sets: AROM;Strengthening;10 reps;Supine;Both Heel Slides: Right;10 reps;AROM;Seated Hip ABduction/ADduction: AROM;Right;10 reps;Supine Straight Leg Raises: AROM;Right;10 reps;Supine Long Arc Quad: AROM;Right;10 reps;Seated Knee Flexion: AAROM;Right;Seated;10 reps Goniometric ROM: 15-80  General Comments General comments (skin integrity, edema, etc.): pt's wife  requested a gait belt for home and was given one      Pertinent Vitals/Pain Pain Assessment: 0-10 Pain Score: 7  Pain Location: R knee Pain Descriptors / Indicators: Aching Pain Intervention(s): Limited activity within patient's tolerance;Premedicated before session    Home Living                      Prior Function            PT Goals (current goals can now be found in the care plan section) Acute Rehab PT Goals Patient Stated Goal: control pain PT Goal Formulation: With patient Time For Goal Achievement: 04/20/18 Potential to Achieve Goals: Good Progress towards PT goals: Goals met/education completed, patient discharged from PT    Frequency    7X/week      PT Plan Current plan remains appropriate    Co-evaluation              AM-PAC PT "6 Clicks" Daily Activity  Outcome Measure  Difficulty turning over in bed (including adjusting bedclothes, sheets and blankets)?: A Little Difficulty moving from lying on back to sitting on the side of the bed? : A Little Difficulty sitting down on and standing up from a chair with arms (e.g., wheelchair, bedside commode, etc,.)?: A Little Help needed moving to and from a bed to chair (including a wheelchair)?: A Little Help needed walking in hospital room?: None Help needed climbing 3-5 steps with a railing? : A Little 6 Click Score: 19    End of Session Equipment Utilized During Treatment: Gait belt Activity Tolerance: Patient tolerated treatment well Patient left: with call bell/phone within reach;with family/visitor present;in chair Nurse Communication: Mobility status PT Visit Diagnosis: Unsteadiness on feet (R26.81);Pain Pain - Right/Left: Right Pain - part of body: Knee     Time: 1093-2355 PT Time Calculation (min) (ACUTE ONLY): 23 min  Charges:  $Gait Training: 23-37 mins $Therapeutic Exercise: 8-22 mins                     Leighton Roach, PT  Acute Rehab Services  [place] 04/16/2018, 2:08 PM

## 2018-04-16 NOTE — Progress Notes (Signed)
   Subjective: 3 Days Post-Op Procedure(s) (LRB): RIGHT TOTAL KNEE ARTHROPLASTY (Right)  Pt c/o moderate pain to right knee and some mild drainage Denies any new symptoms  Patient reports pain as moderate.  Objective:   VITALS:   Vitals:   04/15/18 2158 04/16/18 0456  BP: (!) 142/77 129/70  Pulse: 90 90  Resp:    Temp: 98.4 F (36.9 C) 98.1 F (36.7 C)  SpO2: 93% 92%    Right knee incision with small old blood accumulation Dressing change completed nv intact distally No rashes or edema distally  LABS Recent Labs    04/14/18 0310 04/15/18 0403 04/16/18 0609  HGB 12.3* 11.6* 11.5*  HCT 39.1 36.0* 35.3*  WBC 9.6 10.5 7.8  PLT 272 227 232    Recent Labs    04/14/18 0310  NA 137  K 4.2  BUN 18  CREATININE 1.42*  GLUCOSE 125*     Assessment/Plan: 3 Days Post-Op Procedure(s) (LRB): RIGHT TOTAL KNEE ARTHROPLASTY (Right) Dressing change completed D/c home today F/u in 2 weeks    [facility] [place], [place] is now [employer] [facility] [address] [place], [place], [place] [address] Phone: [phone] [email or web address] Facebook  [employer]

## 2018-04-16 NOTE — Progress Notes (Signed)
Brad, PA stated that it was okay for the patient to discharge w/o BM stated the suppository may work at home. RN will discharge pt when equipment arrives.

## 2018-04-16 NOTE — Progress Notes (Signed)
Discharge Planning:  NCM spoke to pt at bedside. States wife at home to assist with his care. He has RW at home. Contacted [facility] to delivered 3n1 to room prior to dc. Jonnie Finner RN CCM Case Mgmt phone [phone]

## 2018-04-16 NOTE — Care Management Important Message (Signed)
Important Message  Patient Details  Name: Jared [name] MRN: [id number] Date of Birth: [birth year]   Medicare Important Message Given:  Yes    Erenest Rasher, RN 04/16/2018, 11:06 AM

## 2018-04-16 NOTE — Progress Notes (Signed)
Physical Therapy Treatment Patient Details Name: Jared [name] MRN: [id number] DOB: [birth year] Today's Date: 04/16/2018    History of Present Illness 67 y.o. male s/p R TKA 04/13/18. PMH includes: peripheral neuropathy, HTN, HLD, heart attack, GERD, CAD, cervical fusion, L TKA, back surgery.    PT Comments    Pt's gait pattern today much smoother and was able to increase distance to 300' with RW and supervision. Pt practiced stairs with and without rail with wife's assist and was safe with this. Will plan to see pt one more time early afternoon and then should be ready for d/c home.     Follow Up Recommendations  Follow surgeon's recommendation for DC plan and follow-up therapies     Equipment Recommendations  3in1 (PT);Other (comment)(shower seat or tub transfer)    Recommendations for Other Services       Precautions / Restrictions Precautions Precautions: Knee;Fall Precaution Booklet Issued: No Precaution Comments: reviewed proper positioning Required Braces or Orthoses: Knee Immobilizer - Right Knee Immobilizer - Right: Other (comment)(at night when not in CPM) Restrictions Weight Bearing Restrictions: Yes RLE Weight Bearing: Weight bearing as tolerated Other Position/Activity Restrictions: WBAT    Mobility  Bed Mobility Overal bed mobility: Modified Independent Bed Mobility: Supine to Sit     Supine to sit: Modified independent (Device/Increase time)     General bed mobility comments: pt has elevating HOB at home so left HOB at 15 deg and pt able to get to EOB without use of rail and no assist. Increased time needed  Transfers Overall transfer level: Needs assistance Equipment used: Rolling walker (2 wheeled) Transfers: Sit to/from Stand Sit to Stand: Supervision         General transfer comment: needed reminder for proper hand placement with standing and sitting  Ambulation/Gait Ambulation/Gait assistance: Supervision Gait Distance (Feet): 300  Feet Assistive device: Rolling walker (2 wheeled) Gait Pattern/deviations: Step-through pattern;Decreased weight shift to right;Antalgic Gait velocity: decreased Gait velocity interpretation: <1.8 ft/sec, indicate of risk for recurrent falls General Gait Details: worked on reciprocal gait pattern, decreased step length but much smoother gait today   Stairs Stairs: Yes Stairs assistance: Min assist Stair Management: No rails;Forwards;With walker;Step to pattern;One rail Left Number of Stairs: 7 General stair comments: practiced with 1 rail as pt has going into home. Also practiced with wife's assist for steps into bedroom without rail. Pt safe with min A and wife present for training   Wheelchair Mobility    Modified Rankin (Stroke Patients Only)       Balance Overall balance assessment: Needs assistance Sitting-balance support: Feet supported;No upper extremity supported Sitting balance-Leahy Scale: Good     Standing balance support: Bilateral upper extremity supported;During functional activity Standing balance-Leahy Scale: Fair Standing balance comment: depends on RW due to TKA                            Cognition Arousal/Alertness: Awake/alert Behavior During Therapy: WFL for tasks assessed/performed Overall Cognitive Status: Within Functional Limits for tasks assessed                                 General Comments:  slow to process, flat affect, suspect due to meds      Exercises Total Joint Exercises Ankle Circles/Pumps: AROM;Both;10 reps;Supine Quad Sets: AROM;Strengthening;10 reps;Supine;Both Heel Slides: Right;10 reps;Supine;AROM Hip ABduction/ADduction: AROM;Right;10 reps;Supine Straight Leg Raises: AROM;Right;10 reps;Supine Long Arc  Quad: AROM;Right;10 reps;Seated Knee Flexion: AAROM;Right;Seated;10 reps Goniometric ROM: 15-65    General Comments General comments (skin integrity, edema, etc.): reviewed car transfer, home  activity level, use of CPM and zero knee foam      Pertinent Vitals/Pain Pain Assessment: 0-10 Pain Score: 7  Pain Location: R knee Pain Descriptors / Indicators: Aching Pain Intervention(s): Limited activity within patient's tolerance;Monitored during session;Premedicated before session    Home Living                      Prior Function            PT Goals (current goals can now be found in the care plan section) Acute Rehab PT Goals Patient Stated Goal: control pain PT Goal Formulation: With patient Time For Goal Achievement: 04/20/18 Potential to Achieve Goals: Good Progress towards PT goals: Progressing toward goals    Frequency    7X/week      PT Plan Current plan remains appropriate    Co-evaluation              AM-PAC PT "6 Clicks" Daily Activity  Outcome Measure  Difficulty turning over in bed (including adjusting bedclothes, sheets and blankets)?: A Little Difficulty moving from lying on back to sitting on the side of the bed? : A Little Difficulty sitting down on and standing up from a chair with arms (e.g., wheelchair, bedside commode, etc,.)?: A Little Help needed moving to and from a bed to chair (including a wheelchair)?: A Little Help needed walking in hospital room?: None Help needed climbing 3-5 steps with a railing? : A Little 6 Click Score: 19    End of Session Equipment Utilized During Treatment: Gait belt Activity Tolerance: Patient tolerated treatment well Patient left: with call bell/phone within reach;with family/visitor present;in chair Nurse Communication: Mobility status PT Visit Diagnosis: Unsteadiness on feet (R26.81);Pain Pain - Right/Left: Right Pain - part of body: Knee     Time: 0160-1093 PT Time Calculation (min) (ACUTE ONLY): 47 min  Charges:  $Gait Training: 23-37 mins $Therapeutic Exercise: 8-22 mins                     [facility]  [place] 04/16/2018, 10:16 AM

## 2018-04-16 NOTE — Progress Notes (Signed)
Discharge instructions (including medications) discussed with and copy provided to patient/caregiver 

## 2018-04-16 NOTE — Progress Notes (Signed)
Patient refused CPAP tonight. There Isn't a machine in the room at this time. RN aware. Explained to Patient that if they changed their mind, to just have the RN call Respiratory and we would come set them up. 

## 2018-04-16 NOTE — Discharge Summary (Signed)
Orthopedic Discharge Summary        Physician Discharge Summary  Patient ID: Jared [name] MRN: [id number] DOB/AGE: 20-Aug-1951 66 y.o.  Admit date: 04/13/2018 Discharge date: 04/16/2018   Procedures:  Procedure(s) (LRB): RIGHT TOTAL KNEE ARTHROPLASTY (Right)  Attending Physician:  Dr. Esmond Plants  Admission Diagnoses:  Right knee end stage osteoarthritis  Discharge Diagnoses:  Right knee end stage osteoarthritis   Past Medical History:  Diagnosis Date  . Cervical vertebral fusion 05/02/2014   Now presenting with C5-6 radiculopathy  . Coronary artery disease   . Depression   . Depression, prolonged 06/26/2013  . Diverticulosis   . GERD (gastroesophageal reflux disease)   . Heart attack ([place]) 2009  . History of esophageal stricture   . Hyperlipidemia   . Hypertension   . IBS (irritable bowel syndrome)   . Internal hemorrhoids   . Osteoarthritis   . 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 Peripheral neuropathy   . Renal cell carcinoma 1997   Left kidney  . Shoulder pain, right   . Sleep apnea    wears CPAP  . Sleep talking   . Snoring 12/25/2013  . Wears glasses     PCP: Burnard Bunting, MD   Discharged Condition: fair  Hospital Course:  Patient underwent the above stated procedure on 04/13/2018. Patient tolerated the procedure well and brought to the recovery room in good condition and subsequently to the floor. Patient had an uncomplicated hospital course and was stable for discharge.   Disposition: Discharge disposition: 01-Home or Self Care      with follow up in 2 weeks   Follow-up Information    Netta Cedars, MD. Call in 2 weeks.   Specialty:  Orthopedic Surgery Why:  [phone] Contact information: [address] [place] [address] [phone]        Home, [facility] Follow up.   Specialty:  [place] Why:  [place] will call to arrange initial visit Contact  information: [place] [address] [phone]           Discharge Instructions    Call MD / Call 911   Complete by:  As directed    If you experience chest pain or shortness of breath, CALL 911 and be transported to the hospital emergency room.  If you develope a fever above 101 F, pus (white drainage) or increased drainage or redness at the wound, or calf pain, call your surgeon's office.   Call MD / Call 911   Complete by:  As directed    If you experience chest pain or shortness of breath, CALL 911 and be transported to the hospital emergency room.  If you develope a fever above 101 F, pus (white drainage) or increased drainage or redness at the wound, or calf pain, call your surgeon's office.   Constipation Prevention   Complete by:  As directed    Drink plenty of fluids.  Prune juice may be helpful.  You may use a stool softener, such as Colace (over the counter) 100 mg twice a day.  Use MiraLax (over the counter) for constipation as needed.   Constipation Prevention   Complete by:  As directed    Drink plenty of fluids.  Prune juice may be helpful.  You may use a stool softener, such as Colace (over the counter) 100 mg twice a day.  Use MiraLax (over the counter) for constipation as needed.  Diet - low sodium heart healthy   Complete by:  As directed    Diet - low sodium heart healthy   Complete by:  As directed    Increase activity slowly as tolerated   Complete by:  As directed    Increase activity slowly as tolerated   Complete by:  As directed       Allergies as of 04/16/2018      Reactions   Contrast Media [iodinated Diagnostic Agents] Other (See Comments)   Was told not to take d/t pt only having 1 kidney   Nsaids Other (See Comments)   Was told not to take d/t pt only having 1 kidney   Penicillins Hives, Itching, Other (See Comments)   Has patient had a PCN reaction causing immediate rash, facial/tongue/throat swelling, SOB or  lightheadedness with hypotension: Unknown Has patient had a PCN reaction causing severe rash involving mucus membranes or skin necrosis: Unknown Has patient had a PCN reaction that required hospitalization: Unknown Has patient had a PCN reaction occurring within the last 10 years: No If all of the above answers are "NO", then may proceed with Cephalosporin use.   Sulfonamide Derivatives Hives, Itching   Codeine Nausea Only   Statins Other (See Comments)   Myalgias      Medication List    STOP taking these medications   AEROCHAMBER PLUS inhaler   clindamycin 1 % external solution Commonly known as:  CLEOCIN T   mometasone-formoterol 200-5 MCG/ACT Aero Commonly known as:  DULERA     TAKE these medications   ARIPiprazole 20 MG tablet Commonly known as:  ABILIFY Take 20 mg by mouth daily.   Armodafinil 250 MG tablet Take 250 mg by mouth daily.   aspirin 81 MG tablet Take 1 tablet (81 mg total) by mouth daily. What changed:  Another medication with the same name was added. Make sure you understand how and when to take each.   aspirin 81 MG chewable tablet Commonly known as:  ASPIRIN CHILDRENS Chew 1 tablet (81 mg total) by mouth 2 (two) times daily. What changed:  You were already taking a medication with the same name, and this prescription was added. Make sure you understand how and when to take each.   BELSOMRA 20 MG Tabs Generic drug:  Suvorexant Take 20 mg by mouth at bedtime.   DULoxetine 60 MG capsule Commonly known as:  CYMBALTA Take 60 mg by mouth daily.   ezetimibe 10 MG tablet Commonly known as:  ZETIA Take 1 tablet (10 mg total) by mouth daily. NEED OV.   finasteride 5 MG tablet Commonly known as:  PROSCAR Take 5 mg by mouth daily.   fish oil-omega-3 fatty acids 1000 MG capsule Take 1 g by mouth daily.   FLOVENT HFA 110 MCG/ACT inhaler Generic drug:  fluticasone inhale 2 puffs INTO LUNGS twice a day What changed:  See the new instructions.     gemfibrozil 600 MG tablet Commonly known as:  LOPID Take 600 mg by mouth 2 (two) times daily.   hydrOXYzine 50 MG tablet Commonly known as:  ATARAX/VISTARIL Take 50 mg by mouth at bedtime   ipratropium-albuterol 0.5-2.5 (3) MG/3ML Soln Commonly known as:  DUONEB Take 3 mLs by nebulization every 6 (six) hours as needed. What changed:  reasons to take this   isosorbide mononitrate 30 MG 24 hr tablet Commonly known as:  IMDUR take 1 tablet by mouth once daily What changed:    how much to take  how to take this  when to take this   levothyroxine 25 MCG tablet Commonly known as:  SYNTHROID, LEVOTHROID Take 25 mcg by mouth daily before breakfast.   methocarbamol 500 MG tablet Commonly known as:  ROBAXIN Take 1 tablet (500 mg total) by mouth 3 (three) times daily as needed.   montelukast 10 MG tablet Commonly known as:  SINGULAIR Take 1 tablet (10 mg total) by mouth at bedtime.   multivitamin tablet Take 1 tablet by mouth daily.   nitroGLYCERIN 0.4 MG SL tablet Commonly known as:  NITROSTAT Place 1 tablet (0.4 mg total) under the tongue every 5 (five) minutes as needed for chest pain.   omeprazole 20 MG capsule Commonly known as:  PRILOSEC TAKE 1 CAPSULE BY MOUTH TWICE A DAY AS DIRECTED   oxyCODONE-acetaminophen 5-325 MG tablet Commonly known as:  PERCOCET Take 1-2 tablets by mouth every 4 (four) hours as needed for moderate pain or severe pain.   PROAIR HFA 108 (90 Base) MCG/ACT inhaler Generic drug:  albuterol inhale 2 puffs by mouth every 4 to 6 hours if needed for cough or wheezing   RAPAFLO 8 MG Caps capsule Generic drug:  silodosin Take 8 mg by mouth at bedtime.   vitamin C 500 MG tablet Commonly known as:  ASCORBIC ACID Take 500 mg by mouth 2 (two) times daily.   XOLAIR 150 MG injection Generic drug:  omalizumab INJECT 150MG  SUBCUTANEOUSLY EVERY 4 WEEKS (GIVEN AT PRESCRIBER OFFICE)            Durable Medical Equipment  (From admission,  onward)        Start     Ordered   04/16/18 1006  For home use only DME 3 n 1  Once     04/16/18 1006   04/16/18 1004  For home use only DME 3 n 1  Once     04/16/18 1004        Signed: Ventura Bruns 04/16/2018, 10:06 AM  [facility] is now [employer] [address] [place], [place], [place] [address] Phone: [place]

## 2018-04-17 ENCOUNTER — Encounter (HOSPITAL_COMMUNITY): Payer: Self-pay | Admitting: Orthopedic Surgery

## 2018-05-02 ENCOUNTER — Ambulatory Visit (INDEPENDENT_AMBULATORY_CARE_PROVIDER_SITE_OTHER): Payer: Medicare Other | Admitting: *Deleted

## 2018-05-02 DIAGNOSIS — J4540 Moderate persistent asthma, uncomplicated: Secondary | ICD-10-CM

## 2018-05-02 MED ORDER — OMALIZUMAB 150 MG ~~LOC~~ SOLR
150.0000 mg | SUBCUTANEOUS | Status: DC
  Administered 2018-05-02: 150 mg via SUBCUTANEOUS

## 2018-06-25 ENCOUNTER — Ambulatory Visit (INDEPENDENT_AMBULATORY_CARE_PROVIDER_SITE_OTHER): Payer: Medicare Other

## 2018-06-25 DIAGNOSIS — J4540 Moderate persistent asthma, uncomplicated: Secondary | ICD-10-CM

## 2018-07-03 ENCOUNTER — Emergency Department (HOSPITAL_COMMUNITY): Payer: Medicare Other

## 2018-07-03 ENCOUNTER — Encounter: Payer: Self-pay | Admitting: Allergy and Immunology

## 2018-07-03 ENCOUNTER — Ambulatory Visit: Payer: Medicare Other | Admitting: Allergy and Immunology

## 2018-07-03 ENCOUNTER — Emergency Department (HOSPITAL_COMMUNITY)
Admission: EM | Admit: 2018-07-03 | Discharge: 2018-07-03 | Disposition: A | Discharge status: Home / Self Care | Payer: Medicare Other | Attending: Emergency Medicine | Admitting: Emergency Medicine

## 2018-07-03 VITALS — BP 134/70 | HR 45 | Resp 16

## 2018-07-03 DIAGNOSIS — I10 Essential (primary) hypertension: Secondary | ICD-10-CM | POA: Insufficient documentation

## 2018-07-03 DIAGNOSIS — Z85528 Personal history of other malignant neoplasm of kidney: Secondary | ICD-10-CM | POA: Diagnosis not present

## 2018-07-03 DIAGNOSIS — K219 Gastro-esophageal reflux disease without esophagitis: Secondary | ICD-10-CM

## 2018-07-03 DIAGNOSIS — R0602 Shortness of breath: Secondary | ICD-10-CM | POA: Insufficient documentation

## 2018-07-03 DIAGNOSIS — R001 Bradycardia, unspecified: Secondary | ICD-10-CM | POA: Insufficient documentation

## 2018-07-03 DIAGNOSIS — J3089 Other allergic rhinitis: Secondary | ICD-10-CM | POA: Diagnosis not present

## 2018-07-03 DIAGNOSIS — I2510 Atherosclerotic heart disease of native coronary artery without angina pectoris: Secondary | ICD-10-CM | POA: Insufficient documentation

## 2018-07-03 DIAGNOSIS — J4551 Severe persistent asthma with (acute) exacerbation: Secondary | ICD-10-CM | POA: Diagnosis not present

## 2018-07-03 DIAGNOSIS — J4540 Moderate persistent asthma, uncomplicated: Secondary | ICD-10-CM | POA: Insufficient documentation

## 2018-07-03 LAB — CBC
HCT: 44.6 % (ref 39.0–52.0)
Hemoglobin: 13.5 g/dL (ref 13.0–17.0)
MCH: 25.5 pg — ABNORMAL LOW (ref 26.0–34.0)
MCHC: 30.3 g/dL (ref 30.0–36.0)
MCV: 84.3 fL (ref 80.0–100.0)
Platelets: 376 10*3/uL (ref 150–400)
RBC: 5.29 MIL/uL (ref 4.22–5.81)
RDW: 12.7 % (ref 11.5–15.5)
WBC: 7.9 10*3/uL (ref 4.0–10.5)
nRBC: 0 % (ref 0.0–0.2)

## 2018-07-03 LAB — BASIC METABOLIC PANEL
Anion gap: 10 (ref 5–15)
BUN: 21 mg/dL (ref 8–23)
CO2: 24 mmol/L (ref 22–32)
Calcium: 9.5 mg/dL (ref 8.9–10.3)
Chloride: 104 mmol/L (ref 98–111)
Creatinine, Ser: 1.39 mg/dL — ABNORMAL HIGH (ref 0.61–1.24)
GFR calc Af Amer: 58 mL/min — ABNORMAL LOW (ref >60)
GFR calc non Af Amer: 50 mL/min — ABNORMAL LOW (ref >60)
Glucose, Bld: 123 mg/dL — ABNORMAL HIGH (ref 70–99)
Potassium: 4.1 mmol/L (ref 3.5–5.1)
Sodium: 138 mmol/L (ref 135–145)

## 2018-07-03 LAB — I-STAT TROPONIN, ED: Troponin i, poc: 0 ng/mL (ref 0.00–0.08)

## 2018-07-03 LAB — BRAIN NATRIURETIC PEPTIDE: B Natriuretic Peptide: 20.3 pg/mL (ref 0.0–100.0)

## 2018-07-03 MED ORDER — PREDNISONE 10 MG PO TABS: ORAL_TABLET | ORAL | 0 refills | Status: AC

## 2018-07-03 NOTE — ED Notes (Signed)
Physical copy EKG found in triage.

## 2018-07-03 NOTE — ED Triage Notes (Signed)
Pt  Noted to have bradycardia while at asthma doctor today, pt denies CP, SOB, denies n/v/d, reports stent placement in 1997, pt A&O x4

## 2018-07-03 NOTE — Discharge Instructions (Addendum)
You have been on a monitor the whole time but in the emergency department without any evidence of bradycardia.  It is unclear as to why this was taking place at the doctor's office however he should probably follow-up with cardiology to get a monitor placed to make sure is not happening.  If any episodes of lightheadedness or syncope please return to the emergency department.  Any shortness of breath or lower extremity swelling or noticed low heart rate or fast heart rate please return here.

## 2018-07-03 NOTE — Progress Notes (Signed)
Follow-up Note  Referring Provider: Burnard Bunting, MD Primary Provider: Burnard Bunting, MD Date of Office Visit: 07/03/2018  Subjective:   Jared [name] (DOB: 1951-02-04) is a 67 y.o. male who returns to the [facility] on 07/03/2018 in re-evaluation of the following:  HPI: [name] returns to this clinic in reevaluation of his severe asthma treated with omalizumab, allergic rhinitis, and reflux induced respiratory disease.  I have not seen him in this clinic since 27 June 2017.    He did visit with Dr. Ernst [name] 21 December 2017 for what appeared to be a asthma exacerbation for which he was treated with a systemic steroid and started on Breo to replace his Dulera and Flovent.  Once he ran out of his sample of Breo he did not continue on this medicine and did not restart his Jared [name] and Flovent.  4 days ago he developed very significant wheezing with some slight cough and some shortness of breath and he has been using a short acting bronchodilator on and off since that point in time.  He does not have any associated chest pain or sputum production or nasal issues or increase in his reflux symptoms.  He has not had any swelling of his legs.  Allergies as of 07/03/2018      Reactions   Contrast Media [iodinated Diagnostic Agents] Other (See Comments)   Was told not to take d/t pt only having 1 kidney   Nsaids Other (See Comments)   Was told not to take d/t pt only having 1 kidney   Penicillins Hives, Itching, Other (See Comments)   Has patient had a PCN reaction causing immediate rash, facial/tongue/throat swelling, SOB or lightheadedness with hypotension: Unknown Has patient had a PCN reaction causing severe rash involving mucus membranes or skin necrosis: Unknown Has patient had a PCN reaction that required hospitalization: Unknown Has patient had a PCN reaction occurring within the last 10 years: No If all of the above answers are "NO", then may proceed with  Cephalosporin use.   Sulfonamide Derivatives Hives, Itching   Codeine Nausea Only   Statins Other (See Comments)   Myalgias      Medication List      ARIPiprazole 20 MG tablet Commonly known as:  ABILIFY Take 20 mg by mouth daily.   Armodafinil 250 MG tablet Take 250 mg by mouth daily.   aspirin 81 MG tablet Take 1 tablet (81 mg total) by mouth daily.   BELSOMRA 20 MG Tabs Generic drug:  Suvorexant Take 20 mg by mouth at bedtime.   DULERA 200-5 MCG/ACT Aero Generic drug:  mometasone-formoterol Inhale 1 puff into the lungs 2 (two) times daily.   DULoxetine 60 MG capsule Commonly known as:  CYMBALTA Take 60 mg by mouth daily.   ezetimibe 10 MG tablet Commonly known as:  ZETIA Take 1 tablet (10 mg total) by mouth daily. NEED OV.   finasteride 5 MG tablet Commonly known as:  PROSCAR Take 5 mg by mouth daily.   fish oil-omega-3 fatty acids 1000 MG capsule Take 1 g by mouth daily.   gemfibrozil 600 MG tablet Commonly known as:  LOPID Take 600 mg by mouth 2 (two) times daily.   hydrOXYzine 50 MG tablet Commonly known as:  ATARAX/VISTARIL Take 50 mg by mouth at bedtime   ipratropium-albuterol 0.5-2.5 (3) MG/3ML Soln Commonly known as:  DUONEB Take 3 mLs by nebulization every 6 (six) hours as needed.   isosorbide mononitrate 30 MG 24  hr tablet Commonly known as:  IMDUR take 1 tablet by mouth once daily   levothyroxine 25 MCG tablet Commonly known as:  SYNTHROID, LEVOTHROID Take 25 mcg by mouth daily before breakfast.   montelukast 10 MG tablet Commonly known as:  SINGULAIR Take 1 tablet (10 mg total) by mouth at bedtime.   multivitamin tablet Take 1 tablet by mouth daily.   nitroGLYCERIN 0.4 MG SL tablet Commonly known as:  NITROSTAT Place 1 tablet (0.4 mg total) under the tongue every 5 (five) minutes as needed for chest pain.   omeprazole 20 MG capsule Commonly known as:  PRILOSEC TAKE 1 CAPSULE BY MOUTH TWICE A DAY AS DIRECTED   PROAIR HFA 108  (90 Base) MCG/ACT inhaler Generic drug:  albuterol inhale 2 puffs by mouth every 4 to 6 hours if needed for cough or wheezing   RAPAFLO 8 MG Caps capsule Generic drug:  silodosin Take 8 mg by mouth at bedtime.   vitamin C 500 MG tablet Commonly known as:  ASCORBIC ACID Take 500 mg by mouth 2 (two) times daily.   XOLAIR 150 MG injection Generic drug:  omalizumab INJECT 150MG  SUBCUTANEOUSLY EVERY 4 WEEKS (GIVEN AT PRESCRIBER OFFICE)       Past Medical History:  Diagnosis Date  . Cervical vertebral fusion 05/02/2014   Now presenting with C5-6 radiculopathy  . Coronary artery disease   . Depression   . Depression, prolonged 06/26/2013  . Diverticulosis   . GERD (gastroesophageal reflux disease)   . Heart attack ([place]) 2009  . History of esophageal stricture   . Hyperlipidemia   . Hypertension   . IBS (irritable bowel syndrome)   . Internal hemorrhoids   . Osteoarthritis   . 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 Peripheral neuropathy   . Renal cell carcinoma 1997   Left kidney  . Shoulder pain, right   . Sleep apnea    wears CPAP  . Sleep talking   . Snoring 12/25/2013  . Wears glasses     Past Surgical History:  Procedure Laterality Date  . ANTERIOR CERVICAL DECOMP/DISCECTOMY FUSION N/A 08/27/2014   Procedure: Cervical six-seven anterior cervical decompression fusion with removal of hardware at Cervical five-six;  Surgeon: Erline Levine, MD;  Location: [facility];  Service: Neurosurgery;  Laterality: N/A;  Cervical six-seven anterior cervical decompression fusion with removal of hardware at Cervical five-six  . BACK SURGERY  1992, 2013  . CARDIAC CATHETERIZATION  05/10/2007   Minimal CAD, normal LV systolic function, medical management  . CARDIAC CATHETERIZATION  04/08/2008   RCA ulcerated 70-80% stenosis, stented with a 3x61mm Endeavor stent at 13atm for 50sec, reduced from 80% ulcerated stenosis to 0%.  . CARDIAC CATHETERIZATION  05/07/2009   50% distal left  main disease-IVUS or flow wire too dangerous in particular setting to perform intervention.  Marland Kitchen CARDIAC CATHETERIZATION  03/18/2010   Medical management  . CARDIOVASCULAR STRESS TEST  02/09/2012   Normal, no significant wall abnormalities noted  . CARPAL TUNNEL RELEASE Bilateral   . COLONOSCOPY W/ BIOPSIES AND POLYPECTOMY    . CORONARY STENT PLACEMENT    . FEMUR FRACTURE SURGERY Left 1995  . HERNIA REPAIR    . NECK SURGERY  2005  . NEPHRECTOMY Left    secondary to cancer  . TOTAL KNEE ARTHROPLASTY Left 2005  . TOTAL KNEE ARTHROPLASTY Right 04/13/2018   Procedure: RIGHT TOTAL KNEE ARTHROPLASTY;  Surgeon: Netta Cedars, MD;  Location: [facility];  Service: Orthopedics;  Laterality: Right;  Review of systems negative except as noted in HPI / PMHx or noted below:  Review of Systems  Constitutional: Negative.   HENT: Negative.   Eyes: Negative.   Respiratory: Negative.   Cardiovascular: Negative.   Gastrointestinal: Negative.   Genitourinary: Negative.   Musculoskeletal: Negative.   Skin: Negative.   Neurological: Negative.   Endo/Heme/Allergies: Negative.   Psychiatric/Behavioral: Negative.      Objective:   Vitals:   07/03/18 1115 07/03/18 1122  BP: 134/70   Pulse: (!) 40 (!) 45  Resp: 16   SpO2: 95%           Physical Exam  HENT:  Head: Normocephalic.  Right Ear: Tympanic membrane, external ear and ear canal normal.  Left Ear: Tympanic membrane, external ear and ear canal normal.  Nose: Nose normal. No mucosal edema or rhinorrhea.  Mouth/Throat: Uvula is midline, oropharynx is clear and moist and mucous membranes are normal. No oropharyngeal exudate.  Eyes: Conjunctivae are normal.  Neck: Trachea normal. No tracheal tenderness present. No tracheal deviation present. No thyromegaly present.  Cardiovascular: Normal rate, regular rhythm, S1 normal, S2 normal and normal heart sounds.  No murmur heard. Pulmonary/Chest: Breath sounds normal. No stridor. No respiratory  distress. He has no wheezes (Bilateral expiratory wheezes all lung fields). He has no rales.  Musculoskeletal: He exhibits no edema.  Lymphadenopathy:       Head (right side): No tonsillar adenopathy present.       Head (left side): No tonsillar adenopathy present.    He has no cervical adenopathy.  Neurological: He is alert.  Skin: No rash noted. He is not diaphoretic. No erythema. Nails show no clubbing.    Diagnostics:    Spirometry was performed and demonstrated an FEV1 of 2.36 at 88 % of predicted.   Assessment and Plan:   1. Asthma, not well controlled, severe persistent, with acute exacerbation   2. Other allergic rhinitis   3. LPRD (laryngopharyngeal reflux disease)   4. Bradycardia with 41-50 beats per minute     1.  DuoNeb delivered in clinic today  2.  Prednisone 40 mg now, 20 mg this evening, then 20 mg a day for 5 days then 15 mg a day for 5 days then 10 mg a day for 5 days then 5 mg a day for 5 days   3. Use a combination of the following:   A. Dulera 200 - 2 inhalations twice a day  B. FLOVENT 110 - 2 inhalations twice a day  C. montelukast 10 mg - one tablet one time per day  D. OTC Rhinocort / Nasacort - one spray each nostril once a day  E. omeprazole 20 mg - one tablet twice a day  F. Xolair and EpiPen  4. Continue DuoNeb or ProAir HFA if needed  5. Continue nasal saline and antihistamine if needed  6. Obtain a EKG today with results to clinic today  7. Return to clinic in 20 days or earlier if problem  8. Anemia? (Hg = 11.5 on 16 April 2018)  Oumar obviously has a very significant issue with inflammation of his airway and we will address this issue with the treatment noted above which does include the use of systemic steroids.  We will restart him on a large collection of anti-inflammatory agents for his airway and he will continue on omalizumab injections and also continue to treat reflux.  He does have bradycardia today and we will obtain an EKG and  obviously if  some significant abnormality issue shows up on that study we will refer him into cardiology for further evaluation.  As well, he does appear to have a hemoglobin that has decreased significantly and whether or not this is secondary to his recent total knee replacement or some other issue may require further evaluation once we get a few other issues worked out with the plan noted above.  Allena Katz, MD [facility]

## 2018-07-03 NOTE — Patient Instructions (Addendum)
  1.  DuoNeb delivered in clinic today  2.  Prednisone 40 mg now, 20 mg this evening, then 20 mg a day for 5 days then 15 mg a day for 5 days then 10 mg a day for 5 days then 5 mg a day for 5 days   3. Use a combination of the following:   A. Dulera 200 - 2 inhalations twice a day  B. FLOVENT 110 - 2 inhalations twice a day  C. montelukast 10 mg - one tablet one time per day  D. OTC Rhinocort / Nasacort - one spray each nostril once a day  E. omeprazole 20 mg - one tablet twice a day  F. Xolair and EpiPen  4. Continue DuoNeb or ProAir HFA if needed  5. Continue nasal saline and antihistamine if needed  6. Obtain a EKG today with results to clinic today  7. Return to clinic in 20 days or earlier if problem  8. Anemia? (Hg = 11.5 on 16 April 2018)

## 2018-07-03 NOTE — ED Notes (Signed)
Lab called regarding BMP. Stated that it is in process.

## 2018-07-03 NOTE — ED Provider Notes (Signed)
Emergency Department Provider Note   I have reviewed the triage vital signs and the nursing notes.   HISTORY  Chief Complaint Bradycardia   HPI Jared [name] is a 67 y.o. male with medical problems documented below the presents to the emergency department today secondary to asymptomatic bradycardia.  Patient was at his allergist office today and was found to have a heart rate of 44-45.  He is not have any near syncope or lightheadedness at that time.  No chest pain or however he does had recent worsening shortness of breath and wheezing which have been attributed to bronchitis.  No recent chest pain or medication changes. No other associated or modifying symptoms.    Past Medical History:  Diagnosis Date  . Cervical vertebral fusion 05/02/2014   Now presenting with C5-6 radiculopathy  . Coronary artery disease   . Depression   . Depression, prolonged 06/26/2013  . Diverticulosis   . GERD (gastroesophageal reflux disease)   . Heart attack ([place]) 2009  . History of esophageal stricture   . Hyperlipidemia   . Hypertension   . IBS (irritable bowel syndrome)   . Internal hemorrhoids   . Osteoarthritis   . 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 Peripheral neuropathy   . Renal cell carcinoma 1997   Left kidney  . Shoulder pain, right   . Sleep apnea    wears CPAP  . Sleep talking   . Snoring 12/25/2013  . Wears glasses     Patient Active Problem List   Diagnosis Date Noted  . 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 Severe persistent asthma with acute exacerbation 12/21/2017  . Other allergic rhinitis 12/21/2017  . LPRD (laryngopharyngeal reflux disease) 12/21/2017  . Allergic rhinoconjunctivitis 05/21/2015  . C7 cervical fracture ([place]) 08/27/2014  . Cervical spine fracture ([place]) 08/23/2014  . Abnormal brain MRI 07/23/2014  . 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 Cervical vertebral fusion 05/02/2014  . Snoring 12/25/2013  . Parasomnia due to  medical condition 12/25/2013  . Insomnia due to mental condition 12/25/2013  . Sleep talking   . Essential hypertension 07/08/2013  . Depression, prolonged 06/26/2013  . Night sweats 06/26/2013  . Chest pain at rest 06/25/2013  . Bronchiectasis without acute exacerbation ([place]) 01/16/2008  . PULMONARY NODULE 12/20/2007  . HYPERNEPHROMA 11/27/2007  . DYSLIPIDEMIA 11/27/2007  . Coronary atherosclerosis 11/27/2007  . HEMORRHOIDS, INTERNAL 11/27/2007  . Moderate persistent asthma 11/27/2007  . ESOPHAGITIS 11/27/2007  . ESOPHAGEAL STRICTURE 11/27/2007  . GERD 11/27/2007  . GASTRITIS, ACUTE 11/27/2007  . DIVERTICULOSIS, COLON 11/27/2007  . ABDOMINAL PAIN, LEFT UPPER QUADRANT 11/27/2007  . HIP FRACTURE, LEFT 11/27/2007  . FRACTURE, TIBIA 11/27/2007  . BENIGN PROSTATIC HYPERTROPHY, HX OF 11/27/2007    Past Surgical History:  Procedure Laterality Date  . ANTERIOR CERVICAL DECOMP/DISCECTOMY FUSION N/A 08/27/2014   Procedure: Cervical six-seven anterior cervical decompression fusion with removal of hardware at Cervical five-six;  Surgeon: Erline Levine, MD;  Location: [facility];  Service: Neurosurgery;  Laterality: N/A;  Cervical six-seven anterior cervical decompression fusion with removal of hardware at Cervical five-six  . BACK SURGERY  1992, 2013  . CARDIAC CATHETERIZATION  05/10/2007   Minimal CAD, normal LV systolic function, medical management  . CARDIAC CATHETERIZATION  04/08/2008   RCA ulcerated 70-80% stenosis, stented with a 3x8mm Endeavor stent at 13atm for 50sec, reduced from 80% ulcerated stenosis to 0%.  . CARDIAC CATHETERIZATION  05/07/2009   50% distal left main disease-IVUS or  flow wire too dangerous in particular setting to perform intervention.  Marland Kitchen CARDIAC CATHETERIZATION  03/18/2010   Medical management  . CARDIOVASCULAR STRESS TEST  02/09/2012   Normal, no significant wall abnormalities noted  . CARPAL TUNNEL RELEASE Bilateral   . COLONOSCOPY W/ BIOPSIES AND POLYPECTOMY     . CORONARY STENT PLACEMENT    . FEMUR FRACTURE SURGERY Left 1995  . HERNIA REPAIR    . NECK SURGERY  2005  . NEPHRECTOMY Left    secondary to cancer  . TOTAL KNEE ARTHROPLASTY Left 2005  . TOTAL KNEE ARTHROPLASTY Right 04/13/2018   Procedure: RIGHT TOTAL KNEE ARTHROPLASTY;  Surgeon: Netta Cedars, MD;  Location: [facility];  Service: Orthopedics;  Laterality: Right;    Current Outpatient Rx  . Order #: [id number] Class: Historical Med  . Order #: [id number] Class: Historical Med  . Order #: [id number] Class: No Print  . Order #: [id number] Class: Historical Med  . Order #: [id number] Class: Historical Med  . Order #: [id number] Class: Normal  . Order #: [id number] Class: Historical Med  . Order #: [id number] Class: Historical Med  . Order #: [id number] Class: Historical Med  . Order #: [id number] Class: Historical Med  . Order #: [id number] Class: Normal  . Order #: [id number] Class: Normal  . Order #: [id number] Class: Historical Med  . Order #: [id number] Class: Normal  . Order #: [id number] Class: Historical Med  . Order #: [id number] Class: Normal  . Order #: [id number] Class: Normal  . Order #: [id number] Class: Print  . Order #: [id number] Class: Normal  . Order #: [id number] Class: Historical Med  . Order #: [id number] Class: Historical Med  . Order #: [id number] Class: Normal  . Order #: [id number] Class: Historical Med    Allergies Contrast media [iodinated diagnostic agents]; Nsaids; Penicillins; Sulfonamide derivatives; Codeine; and Statins  Family History  Problem Relation Age of Onset  . Asthma Mother   . Heart disease Mother   . Hypertension Mother   . Esophageal cancer Father   . Barrett's esophagus Father   . Bone cancer Father        mets from esophagus  . Heart disease Father   . Heart disease Paternal Grandfather   . Heart attack Paternal Grandfather 50  . Colon cancer Maternal Uncle   . Diabetes Sister   . Cancer Sister        Cervical cancer  . Stomach cancer Neg Hx     Social  History Social History   Tobacco Use  . Smoking status: Never Smoker  . Smokeless tobacco: Never Used  Substance Use Topics  . Alcohol use: No  . Drug use: No    Review of Systems  All other systems negative except as documented in the HPI. All pertinent positives and negatives as reviewed in the HPI. ____________________________________________   PHYSICAL EXAM:  VITAL SIGNS: ED Triage Vitals  Enc Vitals Group     BP 07/03/18 1344 (!) 148/91     Pulse Rate 07/03/18 1325 89     Resp 07/03/18 1344 18     Temp 07/03/18 1344 98.7 F (37.1 C)     Temp Source 07/03/18 1325 Oral     SpO2 07/03/18 1344 98 %    Constitutional: Alert and oriented. Well appearing and in no acute distress. Eyes: Conjunctivae are normal. PERRL. EOMI. Head: Atraumatic. Nose: No congestion/rhinnorhea. Mouth/Throat: Mucous membranes are moist.  Oropharynx non-erythematous. Neck: No stridor.  No meningeal signs.   Cardiovascular: Normal rate, regular rhythm. Good peripheral circulation. Grossly normal heart sounds.   Respiratory: Normal respiratory effort.  No retractions.  Lungs with diffuse wheezing. Gastrointestinal: Soft and nontender. No distention.  Musculoskeletal: No lower extremity tenderness nor edema. No gross deformities of extremities. Neurologic:  Normal speech and language. No gross focal neurologic deficits are appreciated.  Skin:  Skin is warm, dry and intact. No rash noted.  ____________________________________________   LABS (all labs ordered are listed, but only abnormal results are displayed)  Labs Reviewed  BASIC METABOLIC PANEL - Abnormal; Notable for the following components:      Result Value   Glucose, Bld 123 (*)    Creatinine, Ser 1.39 (*)    GFR calc non Af Amer 50 (*)    GFR calc Af Amer 58 (*)    All other components within normal limits  CBC - Abnormal; Notable for the following components:   MCH 25.5 (*)    All other components within normal limits  BRAIN  NATRIURETIC PEPTIDE  I-STAT TROPONIN, ED   ____________________________________________  EKG   EKG Interpretation  Date/Time:  Tuesday July 03 2018 17:23:45 EDT Ventricular Rate:  77 PR Interval:    QRS Duration: 87 QT Interval:  444 QTC Calculation: 503 R Axis:   64 Text Interpretation:  Sinus rhythm Abnormal R-wave progression, early transition Nonspecific T abnormalities, lateral leads Prolonged QT interval improved PAC's/bigeminy from earilier in day Confirmed by Merrily Pew [phone]) on 07/03/2018 9:04:49 PM       ____________________________________________  RADIOLOGY  Dg Chest 2 View  Result Date: 07/03/2018 CLINICAL DATA:  Bradycardia. Shortness of breath. History of asthma. EXAM: CHEST - 2 VIEW COMPARISON:  Portable chest 08/23/2014.  CT 08/23/2014. FINDINGS: The heart size and mediastinal contours are stable. There is stable chronic atelectasis in the right middle lobe. The lungs are otherwise clear. There is no pleural effusion or pneumothorax. No acute osseous findings are demonstrated status post lower cervical fusion. Several EKG snaps overlie the chest. IMPRESSION: Stable chest.  No active cardiopulmonary process. Electronically Signed   By: Richardean Sale M.D.   On: 07/03/2018 13:21    ____________________________________________   INITIAL IMPRESSION / ASSESSMENT AND PLAN / ED COURSE  Patient without one single episode of bradycardia while here.  He does have any symptoms to suggest that he has been in bradycardia cardia for a while with the wheezing I did do a chest x-ray and BMP just to ensure was not cardiac wheezing from possible heart failure if he really was bradycardic however this was normal.  Will have follow-up with his cardiologist.  I am beta-blockers no electrolyte abnormalities.  Stable for discharge at this time.  Will return to the emergency room if becomes symptomatic or notices a low heart rate.     Pertinent labs & imaging results that  were available during my care of the patient were reviewed by me and considered in my medical decision making (see chart for details).  ____________________________________________  FINAL CLINICAL IMPRESSION(S) / ED DIAGNOSES  Final diagnoses:  Bradycardia     MEDICATIONS GIVEN DURING THIS VISIT:  Medications - No data to display   NEW OUTPATIENT MEDICATIONS STARTED DURING THIS VISIT:  Discharge Medication List as of 07/03/2018  9:06 PM    START taking these medications   Details  predniSONE (DELTASONE) 10 MG tablet Multiple Dosages:Starting Tue 07/03/2018, Last dose on Sat 07/07/2018, THEN Starting Sun 07/08/2018, Last dose on Thu 07/12/2018, THEN Starting Fri 07/13/2018, Last dose on Tue 07/17/2018, THEN Starting Wed 07/18/2018, Last dose on Sun 11/3/2019Take  2 tablets (20 mg total) by mouth daily with  breakfast for 5 days, THEN 1.5 tablets (15 mg total) daily with breakfast for 5 days, THEN 1 tablet (10 mg total) daily with breakfast for 5 days, THEN 0.5 tablets (5 mg total) daily with breakfast for 5 days. , Print        Note:  This note was prepared with assistance of Dragon voice recognition software. Occasional wrong-word or sound-a-like substitutions may have occurred due to the inherent limitations of voice recognition software.   Atiana Levier, Corene Cornea, MD 07/04/18 [phone]

## 2018-07-03 NOTE — ED Notes (Signed)
Pt alert and oriented x4. Skin warm and dry. Respirations equal and unlabored. s1 and s2 heart sounds audible. Pt denies pain. Pt placed on cardiac monitor. NSR to monitor during shift. No acute distress noted. Vitals WDL.

## 2018-07-04 ENCOUNTER — Encounter: Payer: Self-pay | Admitting: Allergy and Immunology

## 2018-07-09 ENCOUNTER — Other Ambulatory Visit: Payer: Self-pay | Admitting: Allergy

## 2018-07-09 DIAGNOSIS — J4551 Severe persistent asthma with (acute) exacerbation: Secondary | ICD-10-CM

## 2018-07-10 ENCOUNTER — Other Ambulatory Visit: Payer: Self-pay | Admitting: Allergy and Immunology

## 2018-07-23 ENCOUNTER — Ambulatory Visit (INDEPENDENT_AMBULATORY_CARE_PROVIDER_SITE_OTHER): Payer: Medicare Other | Admitting: *Deleted

## 2018-07-23 DIAGNOSIS — J4540 Moderate persistent asthma, uncomplicated: Secondary | ICD-10-CM

## 2018-07-23 MED ORDER — OMALIZUMAB 150 MG ~~LOC~~ SOLR
150.0000 mg | SUBCUTANEOUS | Status: DC
  Administered 2018-07-23 – 2020-12-31 (×29): 150 mg via SUBCUTANEOUS

## 2018-08-10 ENCOUNTER — Other Ambulatory Visit: Payer: Self-pay | Admitting: *Deleted

## 2018-08-10 ENCOUNTER — Telehealth: Payer: Self-pay | Admitting: Allergy and Immunology

## 2018-08-10 DIAGNOSIS — J4551 Severe persistent asthma with (acute) exacerbation: Secondary | ICD-10-CM

## 2018-08-10 MED ORDER — ALBUTEROL SULFATE HFA 108 (90 BASE) MCG/ACT IN AERS: 2.0000 | INHALATION_SPRAY | RESPIRATORY_TRACT | 1 refills | Status: DC | PRN

## 2018-08-10 MED ORDER — MOMETASONE FURO-FORMOTEROL FUM 200-5 MCG/ACT IN AERO: 2.0000 | INHALATION_SPRAY | Freq: Two times a day (BID) | RESPIRATORY_TRACT | 5 refills | Status: DC

## 2018-08-10 MED ORDER — FLUTICASONE PROPIONATE HFA 110 MCG/ACT IN AERO: 2.0000 | INHALATION_SPRAY | Freq: Two times a day (BID) | RESPIRATORY_TRACT | 5 refills | Status: DC

## 2018-08-10 NOTE — Telephone Encounter (Signed)
Pt called and said he was out of his Ruthe Mannan, Flovent and Proair need to be called into [facility]. [phone].

## 2018-08-10 NOTE — Telephone Encounter (Signed)
Prescriptions have been sent in to the pharmacy. Called patient and informed of prescriptions being sent in. Patient verbalized understanding.

## 2018-08-20 ENCOUNTER — Ambulatory Visit (INDEPENDENT_AMBULATORY_CARE_PROVIDER_SITE_OTHER): Payer: Medicare Other | Admitting: *Deleted

## 2018-08-20 DIAGNOSIS — J4540 Moderate persistent asthma, uncomplicated: Secondary | ICD-10-CM

## 2018-09-17 ENCOUNTER — Ambulatory Visit: Payer: Self-pay

## 2018-09-18 ENCOUNTER — Ambulatory Visit (INDEPENDENT_AMBULATORY_CARE_PROVIDER_SITE_OTHER): Payer: Medicare Other | Admitting: *Deleted

## 2018-09-18 DIAGNOSIS — J4540 Moderate persistent asthma, uncomplicated: Secondary | ICD-10-CM | POA: Diagnosis not present

## 2018-10-08 ENCOUNTER — Emergency Department (HOSPITAL_COMMUNITY): Payer: Medicare Other

## 2018-10-08 ENCOUNTER — Encounter (HOSPITAL_COMMUNITY): Payer: Self-pay

## 2018-10-08 ENCOUNTER — Other Ambulatory Visit: Payer: Self-pay

## 2018-10-08 ENCOUNTER — Emergency Department (HOSPITAL_COMMUNITY)
Admission: EM | Admit: 2018-10-08 | Discharge: 2018-10-08 | Disposition: A | Discharge status: Home / Self Care | Payer: Medicare Other | Attending: Emergency Medicine | Admitting: Emergency Medicine

## 2018-10-08 DIAGNOSIS — Z96653 Presence of artificial knee joint, bilateral: Secondary | ICD-10-CM | POA: Insufficient documentation

## 2018-10-08 DIAGNOSIS — I10 Essential (primary) hypertension: Secondary | ICD-10-CM | POA: Insufficient documentation

## 2018-10-08 DIAGNOSIS — Z7982 Long term (current) use of aspirin: Secondary | ICD-10-CM | POA: Diagnosis not present

## 2018-10-08 DIAGNOSIS — R443 Hallucinations, unspecified: Secondary | ICD-10-CM | POA: Diagnosis present

## 2018-10-08 DIAGNOSIS — I2510 Atherosclerotic heart disease of native coronary artery without angina pectoris: Secondary | ICD-10-CM | POA: Diagnosis not present

## 2018-10-08 DIAGNOSIS — Z79899 Other long term (current) drug therapy: Secondary | ICD-10-CM | POA: Diagnosis not present

## 2018-10-08 LAB — COMPREHENSIVE METABOLIC PANEL
ALT: 29 U/L (ref 0–44)
AST: 22 U/L (ref 15–41)
Albumin: 3.8 g/dL (ref 3.5–5.0)
Alkaline Phosphatase: 76 U/L (ref 38–126)
Anion gap: 10 (ref 5–15)
BUN: 24 mg/dL — ABNORMAL HIGH (ref 8–23)
CO2: 25 mmol/L (ref 22–32)
Calcium: 9.3 mg/dL (ref 8.9–10.3)
Chloride: 102 mmol/L (ref 98–111)
Creatinine, Ser: 1.32 mg/dL — ABNORMAL HIGH (ref 0.61–1.24)
GFR calc Af Amer: >60 mL/min (ref >60)
GFR calc non Af Amer: 55 mL/min — ABNORMAL LOW (ref >60)
Glucose, Bld: 118 mg/dL — ABNORMAL HIGH (ref 70–99)
Potassium: 4.7 mmol/L (ref 3.5–5.1)
Sodium: 137 mmol/L (ref 135–145)
Total Bilirubin: 0.3 mg/dL (ref 0.3–1.2)
Total Protein: 7.7 g/dL (ref 6.5–8.1)

## 2018-10-08 LAB — CBC WITH DIFFERENTIAL/PLATELET
Abs Immature Granulocytes: 0.07 10*3/uL (ref 0.00–0.07)
Basophils Absolute: 0.1 10*3/uL (ref 0.0–0.1)
Basophils Relative: 1 %
Eosinophils Absolute: 0.3 10*3/uL (ref 0.0–0.5)
Eosinophils Relative: 3 %
HCT: 41.5 % (ref 39.0–52.0)
Hemoglobin: 12.9 g/dL — ABNORMAL LOW (ref 13.0–17.0)
Immature Granulocytes: 1 %
Lymphocytes Relative: 20 %
Lymphs Abs: 1.9 10*3/uL (ref 0.7–4.0)
MCH: 24.9 pg — ABNORMAL LOW (ref 26.0–34.0)
MCHC: 31.1 g/dL (ref 30.0–36.0)
MCV: 80.1 fL (ref 80.0–100.0)
Monocytes Absolute: 1.1 10*3/uL — ABNORMAL HIGH (ref 0.1–1.0)
Monocytes Relative: 11 %
Neutro Abs: 6.3 10*3/uL (ref 1.7–7.7)
Neutrophils Relative %: 64 %
Platelets: 214 10*3/uL (ref 150–400)
RBC: 5.18 MIL/uL (ref 4.22–5.81)
RDW: 14.5 % (ref 11.5–15.5)
WBC: 9.8 10*3/uL (ref 4.0–10.5)
nRBC: 0 % (ref 0.0–0.2)

## 2018-10-08 LAB — URINALYSIS, ROUTINE W REFLEX MICROSCOPIC
Bilirubin Urine: NEGATIVE
Glucose, UA: NEGATIVE mg/dL
Hgb urine dipstick: NEGATIVE
Ketones, ur: NEGATIVE mg/dL
Leukocytes, UA: NEGATIVE
Nitrite: NEGATIVE
Protein, ur: NEGATIVE mg/dL
Specific Gravity, Urine: 1.015 (ref 1.005–1.030)
pH: 5 (ref 5.0–8.0)

## 2018-10-08 NOTE — ED Provider Notes (Signed)
[facility] DEPT Provider Note   CSN: [id number] Arrival date & time: 10/08/18  1050     History   Chief Complaint Chief Complaint  Patient presents with  . Hallucinations  . Generalized Body Aches  . Headache    HPI Jared [name] is a 68 y.o. male.  HPI   67yM with hallucinations. This morning he woke up and the lights were out. It looked like his bedroom furniture was made out of wrought iron. Last night he kept walking up because he thought the tv in the other room was on. He felt like he was seeing light from it and also heard noise. When he got up to check though the tv was off. Today he also felt like he was hearing opera music.   He has not felt well since this past Thursday. Shaking chills, felt febrile at times, headache, body aches, cough, no appetite. Not sleeping well despite feeling so tired. PCP called in tamiflu and this would now be his fourth day of taking it. He still feels very fatigued but headache, body aches and chills have improved. He does not feels SOB. No v/d. No urinary complaints.   Past Medical History:  Diagnosis Date  . Cervical vertebral fusion 05/02/2014   Now presenting with C5-6 radiculopathy  . Coronary artery disease   . Depression   . Depression, prolonged 06/26/2013  . Diverticulosis   . GERD (gastroesophageal reflux disease)   . Heart attack ([place]) 2009  . History of esophageal stricture   . Hyperlipidemia   . Hypertension   . IBS (irritable bowel syndrome)   . Internal hemorrhoids   . Osteoarthritis   . 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 Peripheral neuropathy   . Renal cell carcinoma 1997   Left kidney  . Shoulder pain, right   . Sleep apnea    wears CPAP  . Sleep talking   . Snoring 12/25/2013  . Wears glasses     Patient Active Problem List   Diagnosis Date Noted  . 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 Severe persistent asthma with acute exacerbation  12/21/2017  . Other allergic rhinitis 12/21/2017  . LPRD (laryngopharyngeal reflux disease) 12/21/2017  . Allergic rhinoconjunctivitis 05/21/2015  . C7 cervical fracture ([place]) 08/27/2014  . Cervical spine fracture ([place]) 08/23/2014  . Abnormal brain MRI 07/23/2014  . 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 Cervical vertebral fusion 05/02/2014  . Snoring 12/25/2013  . 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 Insomnia due to mental condition 12/25/2013  . Sleep talking   . Essential hypertension 07/08/2013  . Depression, prolonged 06/26/2013  . Night sweats 06/26/2013  . Chest pain at rest 06/25/2013  . Bronchiectasis without acute exacerbation ([place]) 01/16/2008  . PULMONARY NODULE 12/20/2007  . HYPERNEPHROMA 11/27/2007  . DYSLIPIDEMIA 11/27/2007  . Coronary atherosclerosis 11/27/2007  . HEMORRHOIDS, INTERNAL 11/27/2007  . Moderate persistent asthma 11/27/2007  . ESOPHAGITIS 11/27/2007  . ESOPHAGEAL STRICTURE 11/27/2007  . GERD 11/27/2007  . GASTRITIS, ACUTE 11/27/2007  . DIVERTICULOSIS, COLON 11/27/2007  . ABDOMINAL PAIN, LEFT UPPER QUADRANT 11/27/2007  . HIP FRACTURE, LEFT 11/27/2007  . FRACTURE, TIBIA 11/27/2007  . BENIGN PROSTATIC HYPERTROPHY, HX OF 11/27/2007    Past Surgical History:  Procedure Laterality Date  . ANTERIOR CERVICAL DECOMP/DISCECTOMY FUSION N/A 08/27/2014   Procedure: Cervical six-seven anterior cervical decompression fusion with removal of hardware at Cervical five-six;  Surgeon: Erline Levine, MD;  Location: [facility];  Service:  Neurosurgery;  Laterality: N/A;  Cervical six-seven anterior cervical decompression fusion with removal of hardware at Cervical five-six  . BACK SURGERY  1992, 2013  . CARDIAC CATHETERIZATION  05/10/2007   Minimal CAD, normal LV systolic function, medical management  . CARDIAC CATHETERIZATION  04/08/2008   RCA ulcerated 70-80% stenosis, stented with a 3x51mm Endeavor stent at 13atm for 50sec, reduced from 80% ulcerated  stenosis to 0%.  . CARDIAC CATHETERIZATION  05/07/2009   50% distal left main disease-IVUS or flow wire too dangerous in particular setting to perform intervention.  Marland Kitchen CARDIAC CATHETERIZATION  03/18/2010   Medical management  . CARDIOVASCULAR STRESS TEST  02/09/2012   Normal, no significant wall abnormalities noted  . CARPAL TUNNEL RELEASE Bilateral   . COLONOSCOPY W/ BIOPSIES AND POLYPECTOMY    . CORONARY STENT PLACEMENT    . FEMUR FRACTURE SURGERY Left 1995  . HERNIA REPAIR    . NECK SURGERY  2005  . NEPHRECTOMY Left    secondary to cancer  . TOTAL KNEE ARTHROPLASTY Left 2005  . TOTAL KNEE ARTHROPLASTY Right 04/13/2018   Procedure: RIGHT TOTAL KNEE ARTHROPLASTY;  Surgeon: Netta Cedars, MD;  Location: [facility];  Service: Orthopedics;  Laterality: Right;        Home Medications    Prior to Admission medications   Medication Sig Start Date End Date Taking? Authorizing Provider  albuterol (PROAIR HFA) 108 (90 Base) MCG/ACT inhaler Inhale 2 puffs into the lungs every 4 (four) hours as needed for wheezing. 08/10/18  Yes Kozlow, Donnamarie Poag, MD  ARIPiprazole (ABILIFY) 15 MG tablet Take 15 mg by mouth every morning. 09/04/18  Yes [provider]  Armodafinil 250 MG tablet Take 250 mg by mouth daily.  01/22/17  Yes [provider]  aspirin 81 MG tablet Take 1 tablet (81 mg total) by mouth daily. Patient taking differently: Take 325 mg by mouth daily.  04/09/18  Yes Lendon Colonel, NP  BELSOMRA 20 MG TABS Take 20 mg by mouth at bedtime.  01/26/17  Yes [provider]  DULoxetine (CYMBALTA) 60 MG capsule Take 60 mg by mouth daily.   Yes [provider]  ezetimibe (ZETIA) 10 MG tablet Take 1 tablet (10 mg total) by mouth daily. NEED OV. Patient taking differently: Take 10 mg by mouth daily.  07/03/17  Yes Lorretta Harp, MD  finasteride (PROSCAR) 5 MG tablet Take 5 mg by mouth daily.    Yes [provider]  fish oil-omega-3 fatty acids 1000 MG capsule  Take 1 g by mouth at bedtime.    Yes [provider]  fluticasone (FLOVENT HFA) 110 MCG/ACT inhaler Inhale 2 puffs into the lungs 2 (two) times daily. 08/10/18  Yes Kozlow, Donnamarie Poag, MD  gemfibrozil (LOPID) 600 MG tablet Take 600 mg by mouth 2 (two) times daily.   Yes [provider]  hydrOXYzine (ATARAX/VISTARIL) 50 MG tablet Take 150 mg by mouth at bedtime.    Yes [provider]  isosorbide mononitrate (IMDUR) 30 MG 24 hr tablet take 1 tablet by mouth once daily Patient taking differently: Take 30 mg by mouth at bedtime 06/21/17  Yes Lorretta Harp, MD  levothyroxine (SYNTHROID, LEVOTHROID) 25 MCG tablet Take 25 mcg by mouth daily before breakfast.    Yes [provider]  mometasone-formoterol (DULERA) 200-5 MCG/ACT AERO Inhale 2 puffs into the lungs 2 (two) times daily. 08/10/18  Yes Kozlow, Donnamarie Poag, MD  montelukast (SINGULAIR) 10 MG tablet TAKE 1 TABLET(10 MG) BY  MOUTH AT BEDTIME Patient taking differently: Take 10 mg by mouth at bedtime.  07/09/18  Yes Kozlow, Donnamarie Poag, MD  Multiple Vitamin (MULTIVITAMIN) tablet Take 1 tablet by mouth at bedtime.    Yes [provider]  omeprazole (PRILOSEC) 20 MG capsule TAKE 1 CAPSULE BY MOUTH TWICE A DAY AS DIRECTED Patient taking differently: Take 20 mg by mouth at bedtime.  01/29/18  Yes Kozlow, Donnamarie Poag, MD  silodosin (RAPAFLO) 8 MG CAPS capsule Take 8 mg by mouth at bedtime.    Yes [provider]  vitamin C (ASCORBIC ACID) 500 MG tablet Take 500 mg by mouth 2 (two) times daily.    Yes [provider]  XOLAIR 150 MG injection INJECT 150MG  SUBCUTANEOUSLY EVERY 4 WEEKS (GIVEN AT PRESCRIBER OFFICE) Patient taking differently: Inject 150 mg into the skin every 28 (twenty-eight) days.  07/10/18  Yes Kozlow, Donnamarie Poag, MD  ipratropium-albuterol (DUONEB) 0.5-2.5 (3) MG/3ML SOLN Take 3 mLs by nebulization every 6 (six) hours as needed. Patient not taking: Reported on 10/08/2018 01/27/17   Kennith Gain, MD  nitroGLYCERIN (NITROSTAT) 0.4 MG SL tablet Place 1 tablet (0.4 mg total) under the tongue every 5 (five) minutes as needed for chest pain. 07/21/15   Lorretta Harp, MD    Family History Family History  Problem Relation Age of Onset  . Asthma Mother   . Heart disease Mother   . Hypertension Mother   . Esophageal cancer Father   . Barrett's esophagus Father   . Bone cancer Father        mets from esophagus  . Heart disease Father   . Heart disease Paternal Grandfather   . Heart attack Paternal Grandfather 49  . Colon cancer Maternal Uncle   . Diabetes Sister   . Cancer Sister        Cervical cancer  . Stomach cancer Neg Hx     Social History Social History   Tobacco Use  . Smoking status: Never Smoker  . Smokeless tobacco: Never Used  Substance Use Topics  . Alcohol use: Not Currently  . Drug use: No     Allergies   Contrast media [iodinated diagnostic agents]; Nsaids; Penicillins; Sulfonamide derivatives; Codeine; and Statins   Review of Systems Review of Systems All systems reviewed and negative, other than as noted in HPI.   Physical Exam Updated Vital Signs BP (!) 147/82 (BP Location: Left Arm)   Pulse 79   Temp 97.8 F (36.6 C) (Oral)   Resp 18   Ht 5\' 7"  (1.702 m)   Wt 77.1 kg   SpO2 98%   BMI 26.63 kg/m   Physical Exam Vitals signs and nursing note reviewed.  Constitutional:      General: He is not in acute distress.    Appearance: He is well-developed.     Comments: Sitting in bed. Appears tired but non-toxic.   HENT:     Head: Normocephalic and atraumatic.     Mouth/Throat:     Mouth: Mucous membranes are moist.  Eyes:     General:        Right eye: No discharge.        Left eye: No discharge.     Extraocular Movements: Extraocular movements intact.     Conjunctiva/sclera: Conjunctivae normal.     Pupils: Pupils are equal, round, and reactive to light.  Neck:     Musculoskeletal: Neck supple. No neck rigidity or  muscular tenderness.  Cardiovascular:  Rate and Rhythm: Normal rate and regular rhythm.     Heart sounds: Normal heart sounds. No murmur. No friction rub. No gallop.   Pulmonary:     Effort: Pulmonary effort is normal. No respiratory distress.     Breath sounds: Normal breath sounds.  Abdominal:     General: There is no distension.     Palpations: Abdomen is soft.     Tenderness: There is no abdominal tenderness.  Musculoskeletal:        General: No tenderness.  Lymphadenopathy:     Cervical: No cervical adenopathy.  Skin:    General: Skin is warm and dry.  Neurological:     General: No focal deficit present.     Mental Status: He is alert and oriented to person, place, and time.     Cranial Nerves: No cranial nerve deficit.     Sensory: No sensory deficit.     Motor: No weakness.     Coordination: Coordination normal.  Psychiatric:        Mood and Affect: Mood normal.        Speech: Speech normal.        Behavior: Behavior normal.        Thought Content: Thought content normal.      ED Treatments / Results  Labs (all labs ordered are listed, but only abnormal results are displayed) Labs Reviewed  CBC WITH DIFFERENTIAL/PLATELET - Abnormal; Notable for the following components:      Result Value   Hemoglobin 12.9 (*)    MCH 24.9 (*)    Monocytes Absolute 1.1 (*)    All other components within normal limits  COMPREHENSIVE METABOLIC PANEL - Abnormal; Notable for the following components:   Glucose, Bld 118 (*)    BUN 24 (*)    Creatinine, Ser 1.32 (*)    GFR calc non Af Amer 55 (*)    All other components within normal limits  URINALYSIS, ROUTINE W REFLEX MICROSCOPIC    EKG None  Radiology Dg Chest 2 View  Result Date: 10/08/2018 CLINICAL DATA:  Fever. EXAM: CHEST - 2 VIEW COMPARISON:  July 03, 2018 FINDINGS: There is mild scarring in the lung bases. There may be mild chronic atelectasis in the right middle lobe as well. No edema or consolidation. Heart  size and pulmonary vascularity are normal. No adenopathy. There is postoperative change in the lower cervical spine. IMPRESSION: Probable chronic atelectatic change in the right middle lobe. Mild bibasilar scarring. No frank consolidation or edema. Stable cardiac silhouette. Electronically Signed   By: Lowella Grip III M.D.   On: 10/08/2018 12:26   Ct Head Wo Contrast  Result Date: 10/08/2018 CLINICAL DATA:  Unexplained altered level of consciousness. EXAM: CT HEAD WITHOUT CONTRAST TECHNIQUE: Contiguous axial images were obtained from the base of the skull through the vertex without intravenous contrast. COMPARISON:  08/23/2014; brain MRI-07/08/2014 FINDINGS: Brain: Progressive atrophy with sulcal prominence and prominence of the bifrontal extra-axial spaces. Centralized volume loss with commensurate ex vacuo dilatation of the ventricular system. No CT evidence of acute large territory infarct. No intraparenchymal or extra-axial mass or hemorrhage. Unchanged size and configuration of the ventricles and the basilar cisterns. No midline shift. Vascular: Intracranial atherosclerosis. Skull: No displaced calvarial fracture. Sinuses/Orbits: Limited visualization the paranasal sinuses and mastoid air cells is normal. No air-fluid levels. Other: Regional soft tissues appear normal. IMPRESSION: Diffuse atrophy, progressed compared to the 08/2014 examination, without superimposed acute intracranial process. Electronically Signed   By: Jenny Reichmann  Watts M.D.   On: 10/08/2018 12:18    Procedures Procedures (including critical care time)  Medications Ordered in ED Medications - No data to display   Initial Impression / Assessment and Plan / ED Course  I have reviewed the triage vital signs and the nursing notes.  Pertinent labs & imaging results that were available during my care of the patient were reviewed by me and considered in my medical decision making (see chart for details).     67yM with  hallucinations. To me, he seems tired but otherwise exam is nonfocal. He is acting appropriately. Pt/wife concerned that hallucinations may be a side effect of tamiflu. I don't believe it is a common SE, particularly in adults, but I cannot rule out it. Labs, UA, CXR, CT head fairly reassuring.   I suspect hallucinations related to recent illness and lack of sleep. I told him to go ahead and stop tamiflu though. I doubt emergent process. He is acting appropriately here in the ED. No meningeal signs. Plan continued symptomatic treatment.   It has been determined that no acute conditions requiring further emergency intervention are present at this time. The patient and his wife have been advised of the diagnosis and plan. I reviewed any labs and imaging including any potential incidental findings. I have reviewed nursing notes and appropriate previous records. We have discussed signs and symptoms that warrant return to the ED and they are listed in the discharge instructions.       Final Clinical Impressions(s) / ED Diagnoses   Final diagnoses:  Hallucinations    ED Discharge Orders    None       Virgel Manifold, MD 10/08/18 1303

## 2018-10-08 NOTE — Discharge Instructions (Signed)
Stop tamiflu. I suspect the hallucinations are more related to recent illness itself though and a lack of quality sleep. Your labs, urine, chest x-ray and head CT are all reassuring. Make sure you keep drinking plenty of fluids.

## 2018-10-08 NOTE — ED Triage Notes (Signed)
Patient states he has been having visual and auditory hallucinations x 2 days. Patient states he was started on Tamiflu 2 days ago as well. Patient c/o generalized body aches and headache.

## 2018-10-15 ENCOUNTER — Ambulatory Visit (INDEPENDENT_AMBULATORY_CARE_PROVIDER_SITE_OTHER): Payer: Medicare Other

## 2018-10-15 DIAGNOSIS — J4540 Moderate persistent asthma, uncomplicated: Secondary | ICD-10-CM | POA: Diagnosis not present

## 2018-10-16 ENCOUNTER — Ambulatory Visit: Payer: Medicare Other | Admitting: Cardiovascular Disease

## 2018-10-16 ENCOUNTER — Ambulatory Visit: Payer: Self-pay

## 2018-10-16 ENCOUNTER — Encounter: Payer: Self-pay | Admitting: Cardiovascular Disease

## 2018-10-16 VITALS — BP 120/74 | HR 81 | Ht 67.5 in | Wt 185.0 lb

## 2018-10-16 DIAGNOSIS — E7800 Pure hypercholesterolemia, unspecified: Secondary | ICD-10-CM

## 2018-10-16 DIAGNOSIS — I10 Essential (primary) hypertension: Secondary | ICD-10-CM

## 2018-10-16 DIAGNOSIS — I2510 Atherosclerotic heart disease of native coronary artery without angina pectoris: Secondary | ICD-10-CM | POA: Diagnosis not present

## 2018-10-16 NOTE — Patient Instructions (Addendum)
Medication Instructions:  NONE If you need a refill on your cardiac medications before your next appointment, please call your pharmacy.   Lab work: Your physician recommends that you return for lab work TODAY: LIPID PANEL  If you have labs (blood work) drawn today and your tests are completely normal, you will receive your results only by: Marland Kitchen MyChart Message (if you have MyChart) OR . A paper copy in the mail If you have any lab test that is abnormal or we need to change your treatment, we will call you to review the results.  Testing/Procedures: NONE  Follow-Up: At [facility], you and your health needs are our priority.  As part of our continuing mission to provide you with exceptional heart care, we have created designated Provider Care Teams.  These Care Teams include your primary Cardiologist (physician) and Advanced Practice Providers (APPs -  Physician Assistants and Nurse Practitioners) who all work together to provide you with the care you need, when you need it. . You will need a follow up appointment in 12 months.  Please call our office 2 months in advance to schedule this appointment.  You may see Dr. Gwenlyn Found or one of the following Advanced Practice Providers on your designated Care Team:   . Kerin Ransom, [place] . Almyra Deforest, PA-C . Fabian Sharp, PA-C . Jory Sims, DNP . Rosaria Ferries, PA-C . Roby Lofts, PA-C . Sande Rives, PA-C   Follow up: FOLLOW UP WITH CLINICAL PHARMACIST FOR REPATHA

## 2018-10-16 NOTE — Assessment & Plan Note (Signed)
History of essential hypertension her blood pressure measured today at 120/74.  He is not on antihypertensive medications.

## 2018-10-16 NOTE — Assessment & Plan Note (Signed)
History of hyperlipidemia and tolerant to statin therapy on Zetia and gemfibrozil with lipid profile performed 03/19/2018 with a total cholesterol 208, LDL of 129 and HDL of 43.  We will discuss beginning [place].
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
## 2018-10-16 NOTE — Assessment & Plan Note (Signed)
History of CAD status post stent to his RCA by Dr. Rex Kras in 2000.  He was cathed again 2011 revealing a widely patent stent in the RCA with 30% left main.  He denies chest pain or shortness of breath.

## 2018-10-17 LAB — LIPID PANEL
Chol/HDL Ratio: 5.5 ratio — ABNORMAL HIGH (ref 0.0–5.0)
Cholesterol, Total: 226 mg/dL — ABNORMAL HIGH (ref 100–199)
HDL: 41 mg/dL (ref >39)
LDL Calculated: 107 mg/dL — ABNORMAL HIGH (ref 0–99)
Triglycerides: 390 mg/dL — ABNORMAL HIGH (ref 0–149)
VLDL Cholesterol Cal: 78 mg/dL — ABNORMAL HIGH (ref 5–40)

## 2018-10-30 DIAGNOSIS — M65311 Trigger thumb, right thumb: Secondary | ICD-10-CM | POA: Insufficient documentation

## 2018-10-30 DIAGNOSIS — M65321 Trigger finger, right index finger: Secondary | ICD-10-CM | POA: Insufficient documentation

## 2018-10-31 ENCOUNTER — Telehealth: Payer: Self-pay

## 2018-10-31 NOTE — Telephone Encounter (Signed)
   [facility] Pre-operative Risk Assessment    Request for surgical clearance:  1. What type of surgery is being performed? RELEASE A-1 PULLEY RIGHT THUMB & RIGHT INDEX FINGER   2. When is this surgery scheduled? 11-21-2018   3. What type of clearance is required (medical clearance vs. Pharmacy clearance to hold med vs. Both)? MEDICAL  4. Are there any medications that need to be held prior to surgery and how long? NONE LISTED   5. Practice name and name of physician performing surgery? [facility]   6. What is your office phone number [phone]    7.   What is your office fax number [phone]  8.   Anesthesia type (None, local, MAC, general) ? LOCAL   Waylan Rocher 10/31/2018, 1:08 PM  _________________________________________________________________   (provider comments below)

## 2018-11-01 ENCOUNTER — Ambulatory Visit: Payer: Medicare Other

## 2018-11-01 ENCOUNTER — Telehealth: Payer: Self-pay

## 2018-11-01 MED ORDER — EVOLOCUMAB 140 MG/ML ~~LOC~~ SOAJ: 140.0000 mg | SUBCUTANEOUS | 6 refills | Status: DC

## 2018-11-01 NOTE — Progress Notes (Deleted)
11/01/2018 Jared [name] [birth year] [id number]   HPI:  Jared [name] is a 68 y.o. male patient of Dr ***, who presents today for a lipid clinic evaluation.  Current Medications:  Risk Factors:  Cholesterol Goals:   Intolerant/previously tried:  Family history:   Diet:   Exercise:    Labs:   TC 226, TG 390, HDL 41, LDL 107  Current Outpatient Medications  Medication Sig Dispense Refill  . albuterol (PROAIR HFA) 108 (90 Base) MCG/ACT inhaler Inhale 2 puffs into the lungs every 4 (four) hours as needed for wheezing. 1 Inhaler 1  . ARIPiprazole (ABILIFY) 15 MG tablet Take 15 mg by mouth every morning.    . Armodafinil 250 MG tablet Take 250 mg by mouth daily.   0  . aspirin 81 MG tablet Take 1 tablet (81 mg total) by mouth daily. (Patient taking differently: Take 325 mg by mouth daily. )    . BELSOMRA 20 MG TABS Take 20 mg by mouth at bedtime.   0  . DULoxetine (CYMBALTA) 60 MG capsule Take 60 mg by mouth daily.    Marland Kitchen ezetimibe (ZETIA) 10 MG tablet Take 1 tablet (10 mg total) by mouth daily. NEED OV. (Patient taking differently: Take 10 mg by mouth daily. ) 1 tablet 0  . finasteride (PROSCAR) 5 MG tablet Take 5 mg by mouth daily.     . fish oil-omega-3 fatty acids 1000 MG capsule Take 1 g by mouth at bedtime.     . fluticasone (FLOVENT HFA) 110 MCG/ACT inhaler Inhale 2 puffs into the lungs 2 (two) times daily. 12 g 5  . gemfibrozil (LOPID) 600 MG tablet Take 600 mg by mouth 2 (two) times daily.    . hydrOXYzine (ATARAX/VISTARIL) 50 MG tablet Take 150 mg by mouth at bedtime.     Marland Kitchen ipratropium-albuterol (DUONEB) 0.5-2.5 (3) MG/3ML SOLN Take 3 mLs by nebulization every 6 (six) hours as needed. 75 mL 3  . isosorbide mononitrate (IMDUR) 30 MG 24 hr tablet take 1 tablet by mouth once daily (Patient taking differently: Take 30 mg by mouth at bedtime) 15 tablet 0  . levothyroxine (SYNTHROID, LEVOTHROID) 25 MCG tablet Take 25 mcg by mouth daily before breakfast.     .  mometasone-formoterol (DULERA) 200-5 MCG/ACT AERO Inhale 2 puffs into the lungs 2 (two) times daily. 1 Inhaler 5  . montelukast (SINGULAIR) 10 MG tablet TAKE 1 TABLET(10 MG) BY MOUTH AT BEDTIME (Patient taking differently: Take 10 mg by mouth at bedtime. ) 30 tablet 5  . Multiple Vitamin (MULTIVITAMIN) tablet Take 1 tablet by mouth at bedtime.     . nitroGLYCERIN (NITROSTAT) 0.4 MG SL tablet Place 1 tablet (0.4 mg total) under the tongue every 5 (five) minutes as needed for chest pain. 25 tablet 3  . omeprazole (PRILOSEC) 20 MG capsule TAKE 1 CAPSULE BY MOUTH TWICE A DAY AS DIRECTED (Patient taking differently: Take 20 mg by mouth at bedtime. ) 60 capsule 4  . silodosin (RAPAFLO) 8 MG CAPS capsule Take 8 mg by mouth at bedtime.     . vitamin C (ASCORBIC ACID) 500 MG tablet Take 500 mg by mouth 2 (two) times daily.     Jared [name] 150 MG injection INJECT 150MG  SUBCUTANEOUSLY EVERY 4 WEEKS (GIVEN AT PRESCRIBER OFFICE) (Patient taking differently: Inject 150 mg into the skin every 28 (twenty-eight) days. ) 1 vial 10   Current Facility-Administered Medications  Medication Dose Route Frequency Provider Last Rate Last Dose  . omalizumab (  Jared [name]) injection 150 mg  150 mg Subcutaneous Q28 days Jared Prows, MD   150 mg at 10/15/18 0908    Allergies  Allergen Reactions  . 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 Penicillins Hives, Itching and Other (See Comments)    Has patient had a PCN reaction causing immediate rash, facial/tongue/throat swelling, SOB or lightheadedness with hypotension: Unknown Has patient had a PCN reaction causing severe rash involving mucus membranes or skin necrosis: Unknown Has patient had a PCN reaction that required hospitalization: Unknown Has patient had a PCN reaction occurring within the last 10 years: No If  all of the above answers are "NO", then may proceed with Cephalosporin use.   . Sulfonamide Derivatives Hives and Itching  . Codeine Nausea Only  . Statins Other (See Comments)    Myalgias    Past Medical History:  Diagnosis Date  . Cervical vertebral fusion 05/02/2014   Now presenting with C5-6 radiculopathy  . Coronary artery disease   . Depression   . Depression, prolonged 06/26/2013  . Diverticulosis   . GERD (gastroesophageal reflux disease)   . Heart attack ([place]) 2009  . History of esophageal stricture   . Hyperlipidemia   . Hypertension   . IBS (irritable bowel syndrome)   . Internal hemorrhoids   . Osteoarthritis   . 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 Peripheral neuropathy   . Renal cell carcinoma 1997   Left kidney  . Shoulder pain, [name]   . Sleep apnea    wears CPAP  . Sleep talking   . Snoring 12/25/2013  . Wears glasses     There were no vitals taken for this visit.   No problem-specific Assessment & Plan notes found for this encounter.   Jared [name] PharmD CPP [facility]

## 2018-11-01 NOTE — Telephone Encounter (Signed)
   Primary Cardiologist: Quay Burow, MD  Chart reviewed as part of pre-operative protocol coverage. Patient was contacted 11/01/2018 in reference to pre-operative risk assessment for pending surgery as outlined below.  Jared [name] was last seen on 10/16/2018 by Dr. Gwenlyn Found.  Since that day, Jared [name] has done well.  Therefore, based on ACC/AHA guidelines, the patient would be at acceptable risk for the planned procedure without further cardiovascular testing.   I will route this recommendation to the requesting party via Epic fax function and remove from pre-op pool.  Please call with questions.  [place], [place] 11/01/2018, 1:58 PM

## 2018-11-01 NOTE — Telephone Encounter (Signed)
Left message for the patient to call back and speak to preop APP.

## 2018-11-01 NOTE — Telephone Encounter (Signed)
Called the pt to let them know that the insurance approved their repatha and to go find out the copay and if seems unaffordable call the office back and we will attempt to get patient assistance

## 2018-11-08 ENCOUNTER — Ambulatory Visit (INDEPENDENT_AMBULATORY_CARE_PROVIDER_SITE_OTHER): Payer: Medicare Other

## 2018-11-08 DIAGNOSIS — J4540 Moderate persistent asthma, uncomplicated: Secondary | ICD-10-CM

## 2018-11-12 ENCOUNTER — Ambulatory Visit: Payer: Self-pay

## 2018-12-06 ENCOUNTER — Ambulatory Visit (INDEPENDENT_AMBULATORY_CARE_PROVIDER_SITE_OTHER): Payer: Medicare Other | Admitting: *Deleted

## 2018-12-06 DIAGNOSIS — J4540 Moderate persistent asthma, uncomplicated: Secondary | ICD-10-CM

## 2019-01-01 ENCOUNTER — Other Ambulatory Visit: Payer: Self-pay

## 2019-01-01 ENCOUNTER — Ambulatory Visit (INDEPENDENT_AMBULATORY_CARE_PROVIDER_SITE_OTHER): Payer: Medicare Other | Admitting: *Deleted

## 2019-01-01 DIAGNOSIS — J4540 Moderate persistent asthma, uncomplicated: Secondary | ICD-10-CM

## 2019-01-03 ENCOUNTER — Ambulatory Visit: Payer: Self-pay

## 2019-01-14 ENCOUNTER — Encounter: Payer: Self-pay | Admitting: Allergy

## 2019-01-14 ENCOUNTER — Other Ambulatory Visit: Payer: Self-pay

## 2019-01-14 ENCOUNTER — Ambulatory Visit: Payer: Medicare Other | Admitting: Allergy

## 2019-01-14 VITALS — BP 160/100 | HR 90 | Temp 98.6°F | Resp 18 | Ht 65.35 in | Wt 201.4 lb

## 2019-01-14 DIAGNOSIS — J3089 Other allergic rhinitis: Secondary | ICD-10-CM

## 2019-01-14 DIAGNOSIS — I10 Essential (primary) hypertension: Secondary | ICD-10-CM

## 2019-01-14 DIAGNOSIS — K219 Gastro-esophageal reflux disease without esophagitis: Secondary | ICD-10-CM | POA: Diagnosis not present

## 2019-01-14 DIAGNOSIS — J4551 Severe persistent asthma with (acute) exacerbation: Secondary | ICD-10-CM

## 2019-01-14 MED ORDER — METHYLPREDNISOLONE ACETATE 40 MG/ML IJ SUSP
40.0000 mg | Freq: Once | INTRAMUSCULAR | Status: AC
  Administered 2019-01-14: 40 mg via INTRAMUSCULAR

## 2019-01-14 NOTE — Patient Instructions (Addendum)
Asthma:  Start prednisone taper.  . Daily controller medication(s): continue Dulera 200 2 puffs twice a day with spacer and rinse mouth afterwards. o Continue montelukast 10mg  daily. o Continue Flovent 110 2 puffs twice a day with spacer and rinse mouth afterwards. o Continue Xolair injections every 4 weeks . Prior to physical activity: May use albuterol rescue inhaler 2 puffs 5 to 15 minutes prior to strenuous physical activities. Marland Kitchen Rescue medications: May use albuterol rescue inhaler 2 puffs or nebulizer every 4 to 6 hours as needed for shortness of breath, chest tightness, coughing, and wheezing. Monitor frequency of use.  . Asthma control goals:  o Full participation in all desired activities (may need albuterol before activity) o Albuterol use two times or less a week on average (not counting use with activity) o Cough interfering with sleep two times or less a month o Oral steroids no more than once a year o No hospitalizations  Reflux   Continue Prilosec 20mg  daily.  Continue reflux diet - no spicy foods, alcohol, tomato based products.  Follow up in with your PCP regarding the leg welling and blood pressure.   Follow up in 3 months.

## 2019-01-14 NOTE — Progress Notes (Signed)
Follow Up Note  RE: Jared [name] MRN: [id number] DOB: 17-Nov-1950 Date of Office Visit: 01/14/2019  Referring provider: Burnard Bunting, MD Primary care provider: Burnard Bunting, MD  Chief Complaint: Cough (Nonproductive) and Asthma (used 2 nebs treatments about 2 AM today)  History of Present Illness: I had the pleasure of seeing Jared [name] for a follow up visit at the [facility] on 01/14/2019. He is a 68 y.o. male, who is being followed for asthma, allergic rhinitis, LPRD. Today he is here for new complaint of worsening breathing symptoms. His previous allergy office visit was on 07/03/2018 with Dr. Neldon [name].   Asthma: Last night patient had some bad acid reflux which triggered his asthma. He took duoneb x 2 with some benefit. He is still having issues with coughing and slight wheezing. Currently on Dulera 200 2 puffs BID, Flovent 100 2 puffs BID. Usually depo injections helps.  Also on Xolair 150mg  SQ Q4 weeks and doing well.   No additional steroids since the last visit.  No fevers or chills.   Patient had pizza for dinner which set off his reflux. Still taking Prilosec 20mg  daily.   Allergic rhinitis: Well controlled with Xolair.   Assessment and Plan: Jared [name] is a 68 y.o. male with: Severe persistent asthma with acute exacerbation Asthma flare with wheezing and coughing for 1 day triggered by his reflux. No steroids since October 2019.  Start prednisone taper.  IM depo 40mg  given in the office.   . Daily controller medication(s): continue Dulera 200 2 puffs twice a day with spacer and rinse mouth afterwards. o Continue montelukast 10mg  daily. o Continue Flovent 110 2 puffs twice a day with spacer and rinse mouth afterwards. o Continue Xolair injections every 4 weeks . Prior to physical activity: May use albuterol rescue inhaler 2 puffs 5 to 15 minutes prior to strenuous physical activities. Jared [name] Kitchen Rescue medications: May use albuterol rescue  inhaler 2 puffs or nebulizer every 4 to 6 hours as needed for shortness of breath, chest tightness, coughing, and wheezing. Monitor frequency of use.   Other allergic rhinitis Controlled.  Continue Singulair 10mg  daily.  May use over the counter antihistamines such as Zyrtec (cetirizine), Claritin (loratadine), Allegra (fexofenadine), or Xyzal (levocetirizine) daily as needed.  LPRD (laryngopharyngeal reflux disease)  Continue Prilosec 20mg  daily.  Continue reflux diet - no spicy foods, alcohol, tomato based products.  Essential hypertension Elevated BP in the office which he thinks is due to him taking rescue inhaler too much today. Denies headaches, dizziness, chest pains.   Advised patient to follow up with PCP regarding HTN and trace LE edema on [name] > left.  Also monitor salt intake.  Monitor BP closely.   Return in about 3 months (around 04/15/2019).  Meds ordered this encounter  Medications  . methylPREDNISolone acetate (DEPO-MEDROL) injection 40 mg   Diagnostics: None.  Medication List:  Current Outpatient Medications  Medication Sig Dispense Refill  . albuterol (PROAIR HFA) 108 (90 Base) MCG/ACT inhaler Inhale 2 puffs into the lungs every 4 (four) hours as needed for wheezing. 1 Inhaler 1  . ARIPiprazole (ABILIFY) 15 MG tablet Take 15 mg by mouth every morning.    . Armodafinil 250 MG tablet Take 250 mg by mouth daily.   0  . aspirin 81 MG tablet Take 1 tablet (81 mg total) by mouth daily. (Patient taking differently: Take 325 mg by mouth daily. )    . BELSOMRA 20 MG TABS Take 20 mg  by mouth at bedtime.   0  . DULoxetine (CYMBALTA) 60 MG capsule Take 60 mg by mouth daily.    . Evolocumab (REPATHA SURECLICK) 403 MG/ML SOAJ Inject 140 mg into the skin every 14 (fourteen) days. 2 pen 6  . ezetimibe (ZETIA) 10 MG tablet Take 1 tablet (10 mg total) by mouth daily. NEED OV. (Patient taking differently: Take 10 mg by mouth daily. ) 1 tablet 0  . finasteride (PROSCAR) 5  MG tablet Take 5 mg by mouth daily.     . fish oil-omega-3 fatty acids 1000 MG capsule Take 1 g by mouth at bedtime.     . fluticasone (FLOVENT HFA) 110 MCG/ACT inhaler Inhale 2 puffs into the lungs 2 (two) times daily. 12 g 5  . gemfibrozil (LOPID) 600 MG tablet Take 600 mg by mouth 2 (two) times daily.    . hydrOXYzine (ATARAX/VISTARIL) 50 MG tablet Take 150 mg by mouth at bedtime.     Jared [name] Kitchen ipratropium-albuterol (DUONEB) 0.5-2.5 (3) MG/3ML SOLN Take 3 mLs by nebulization every 6 (six) hours as needed. 75 mL 3  . isosorbide mononitrate (IMDUR) 30 MG 24 hr tablet take 1 tablet by mouth once daily (Patient taking differently: Take 30 mg by mouth at bedtime) 15 tablet 0  . levothyroxine (SYNTHROID, LEVOTHROID) 25 MCG tablet Take 25 mcg by mouth daily before breakfast.     . mometasone-formoterol (DULERA) 200-5 MCG/ACT AERO Inhale 2 puffs into the lungs 2 (two) times daily. 1 Inhaler 5  . montelukast (SINGULAIR) 10 MG tablet TAKE 1 TABLET(10 MG) BY MOUTH AT BEDTIME (Patient taking differently: Take 10 mg by mouth at bedtime. ) 30 tablet 5  . Multiple Vitamin (MULTIVITAMIN) tablet Take 1 tablet by mouth at bedtime.     . nitroGLYCERIN (NITROSTAT) 0.4 MG SL tablet Place 1 tablet (0.4 mg total) under the tongue every 5 (five) minutes as needed for chest pain. 25 tablet 3  . omeprazole (PRILOSEC) 20 MG capsule TAKE 1 CAPSULE BY MOUTH TWICE A DAY AS DIRECTED (Patient taking differently: Take 20 mg by mouth at bedtime. ) 60 capsule 4  . silodosin (RAPAFLO) 8 MG CAPS capsule Take 8 mg by mouth at bedtime.     . vitamin C (ASCORBIC ACID) 500 MG tablet Take 500 mg by mouth 2 (two) times daily.     Jared [name] 150 MG injection INJECT 150MG  SUBCUTANEOUSLY EVERY 4 WEEKS (GIVEN AT PRESCRIBER OFFICE) (Patient taking differently: Inject 150 mg into the skin every 28 (twenty-eight) days. ) 1 vial 10   Current Facility-Administered Medications  Medication Dose Route Frequency Provider Last Rate Last Dose  . omalizumab  Jared [name]) injection 150 mg  150 mg Subcutaneous Q28 days Jared Prows, MD   150 mg at 01/01/19 1509   Allergies: Allergies  Allergen Reactions  . 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 Penicillins Hives, Itching and Other (See Comments)    Has patient had a PCN reaction causing immediate rash, facial/tongue/throat swelling, SOB or lightheadedness with hypotension: Unknown Has patient had a PCN reaction causing severe rash involving mucus membranes or skin necrosis: Unknown Has patient had a PCN reaction that required hospitalization: Unknown Has patient had a PCN reaction occurring within the last 10 years: No If all of the above answers are "  NO", then may proceed with Cephalosporin use.   . Sulfonamide Derivatives Hives and Itching  . Codeine Nausea Only  . Statins Other (See Comments)    Myalgias   I reviewed his past medical history, social history, family history, and environmental history and no significant changes have been reported from previous visit on 07/03/2018.  Review of Systems  Constitutional: Negative for appetite change, chills, fever and unexpected weight change.  HENT: Negative for congestion and rhinorrhea.   Eyes: Negative for itching.  Respiratory: Positive for cough and wheezing. Negative for chest tightness and shortness of breath.   Cardiovascular: Positive for leg swelling. Negative for chest pain and palpitations.  Gastrointestinal: Negative for abdominal pain.  Skin: Negative for rash.  Allergic/Immunologic: Positive for environmental allergies.  Neurological: Negative for dizziness and headaches.   Objective: BP (!) 160/100 (BP Location: Left Arm, Patient Position: Sitting, Cuff Size: Normal)   Pulse 90   Temp 98.6 F (37 C) (Oral)   Resp 18   Ht 5' 5.35" (1.66 m)   Wt 201 lb 6.4 oz  (91.4 kg)   SpO2 96%   BMI 33.15 kg/m  Body mass index is 33.15 kg/m. Physical Exam  Constitutional: He is oriented to person, place, and time. He appears well-developed and well-nourished.  HENT:  Head: Normocephalic and atraumatic.  [name] Ear: External ear normal.  Left Ear: External ear normal.  Nose: Nose normal.  Mouth/Throat: Oropharynx is clear and moist.  Eyes: Conjunctivae and EOM are normal.  Neck: Neck supple.  Cardiovascular: Normal rate, regular rhythm and normal heart sounds. Exam reveals no gallop and no friction rub.  No murmur heard. Pulmonary/Chest: Effort normal. He has no wheezes. He has no rales.  Musculoskeletal:        General: Edema (trace LE edema on [name] greater than left) present.  Neurological: He is alert and oriented to person, place, and time.  Skin: Skin is warm.  Psychiatric: He has a normal mood and affect. His behavior is normal.  Nursing note and vitals reviewed.  Previous notes and tests were reviewed. The plan was reviewed with the patient/family, and all questions/concerned were addressed.  It was my pleasure to see Tremar today and participate in his care. Please feel free to contact me with any questions or concerns.  Sincerely,  Rexene Alberts, DO Allergy & Immunology  [facility] office: [phone] [facility] office: [phone]

## 2019-01-14 NOTE — Assessment & Plan Note (Signed)
Elevated BP in the office which he thinks is due to him taking rescue inhaler too much today. Denies headaches, dizziness, chest pains.   Advised patient to follow up with PCP regarding HTN and trace LE edema on right > left.  Also monitor salt intake.  Monitor BP closely.

## 2019-01-14 NOTE — Assessment & Plan Note (Signed)
Controlled.  Continue Singulair 10mg  daily.  May use over the counter antihistamines such as Zyrtec (cetirizine), Claritin (loratadine), Allegra (fexofenadine), or Xyzal (levocetirizine) daily as needed.

## 2019-01-14 NOTE — Assessment & Plan Note (Signed)
Asthma flare with wheezing and coughing for 1 day triggered by his reflux. No steroids since October 2019.  Start prednisone taper.  IM depo 40mg  given in the office.   . Daily controller medication(s): continue Dulera 200 2 puffs twice a day with spacer and rinse mouth afterwards. o Continue montelukast 10mg  daily. o Continue Flovent 110 2 puffs twice a day with spacer and rinse mouth afterwards. o Continue Xolair injections every 4 weeks . Prior to physical activity: May use albuterol rescue inhaler 2 puffs 5 to 15 minutes prior to strenuous physical activities. Marland Kitchen Rescue medications: May use albuterol rescue inhaler 2 puffs or nebulizer every 4 to 6 hours as needed for shortness of breath, chest tightness, coughing, and wheezing. Monitor frequency of use.

## 2019-01-14 NOTE — Assessment & Plan Note (Signed)
   Continue Prilosec 20mg  daily.  Continue reflux diet - no spicy foods, alcohol, tomato based products.

## 2019-01-29 ENCOUNTER — Ambulatory Visit: Payer: Self-pay

## 2019-01-30 ENCOUNTER — Encounter: Payer: Self-pay | Admitting: Allergy

## 2019-01-30 ENCOUNTER — Other Ambulatory Visit: Payer: Self-pay

## 2019-01-30 ENCOUNTER — Ambulatory Visit (INDEPENDENT_AMBULATORY_CARE_PROVIDER_SITE_OTHER): Payer: Medicare Other | Admitting: Allergy

## 2019-01-30 DIAGNOSIS — J3089 Other allergic rhinitis: Secondary | ICD-10-CM | POA: Diagnosis not present

## 2019-01-30 DIAGNOSIS — K219 Gastro-esophageal reflux disease without esophagitis: Secondary | ICD-10-CM | POA: Diagnosis not present

## 2019-01-30 DIAGNOSIS — J4550 Severe persistent asthma, uncomplicated: Secondary | ICD-10-CM | POA: Diagnosis not present

## 2019-01-30 DIAGNOSIS — J302 Other seasonal allergic rhinitis: Secondary | ICD-10-CM

## 2019-01-30 NOTE — Assessment & Plan Note (Signed)
No flares since April 2020. Well controlled with below regimen. Will get new PA for Xolair.   Daily controller medication(s):continue Dulera 200 2 puffs twice a day with spacer and rinse mouth afterwards. ? Continue montelukast 10mg  daily. ? Continue Flovent 110 2 puffs twice a day with spacer and rinse mouth afterwards. ? Continue Xolair injections every 4 weeks.  Prior to physical activity:May use albuterol rescue inhaler 2 puffs 5 to 15 minutes prior to strenuous physical activities.  Rescue medications:May use albuterol rescue inhaler 2 puffs or nebulizer every 4 to 6 hours as needed for shortness of breath, chest tightness, coughing, and wheezing. Monitor frequency of use.

## 2019-01-30 NOTE — Progress Notes (Signed)
RE: Jared [name] MRN: [id number] DOB: [birth year] Date of Telemedicine Visit: 01/30/2019  Referring provider: Burnard Bunting, MD Primary care provider: Burnard Bunting, MD  Chief Complaint: Asthma (needs Xolair approved again. asthma doing good now. )   Telemedicine Follow Up Visit via Telephone: I connected with Jared [name] for a follow up on 01/30/19 by telephone and verified that I am speaking with the correct person using two identifiers.   I discussed the limitations, risks, security and privacy concerns of performing an evaluation and management service by telephone and the availability of in person appointments. I also discussed with the patient that there may be a patient responsible charge related to this service. The patient expressed understanding and agreed to proceed.  Patient is at home. Provider is at the office.  Visit start time: 3:30PM Visit end time: 3:40PM Insurance consent/check in by: [facility] Medical consent and medical assistant/nurse: Lucrezia Starch  History of Present Illness: He is a 68 y.o. male, who is being followed for asthma, allergic rhinitis, LPRD. His previous allergy office visit was on 01/14/2019 with Dr. [name]. Today is a regular follow up visit.  Severe persistent asthma Currently on Dulera 200 2 puffs BID, Singulair 10mg  daily, Flovent 110 2 puffs BID. Has been on Xolair injections for 10 years with good benefit. Needs new PA.  No issues with injections and has Epipen on hand if needed.   No albuterol use after last exacerbation in April and symptoms resolved after the prednisone.   Other allergic rhinitis Controlled with Singulair 10mg  daily.  LPRD (laryngopharyngeal reflux disease) Taking Prilosec 20mg  daily but sometimes has flares when eating spicy foods.   Essential hypertension Went to see PCP and apparently work up was normal. No edema on the legs and BP improved.   Assessment and Plan: Aric is a 68 y.o. male with:  Severe persistent asthma without complication No flares since April 2020. Well controlled with below regimen. Will get new PA for Xolair.   Daily controller medication(s):continue Dulera 200 2 puffs twice a day with spacer and rinse mouth afterwards. ? Continue montelukast 10mg  daily. ? Continue Flovent 110 2 puffs twice a day with spacer and rinse mouth afterwards. ? Continue Xolair injections every 4 weeks.  Prior to physical activity:May use albuterol rescue inhaler 2 puffs 5 to 15 minutes prior to strenuous physical activities.  Rescue medications:May use albuterol rescue inhaler 2 puffs or nebulizer every 4 to 6 hours as needed for shortness of breath, chest tightness, coughing, and wheezing. Monitor frequency of use.   Seasonal and perennial allergic rhinitis Past history - 2004 testing positive to ragweed, trees, mold and dust mites. Interim history - controlled.   Continue environmental control measures.   Continue Singulair 10mg  daily.  May use over the counter antihistamines such as Zyrtec (cetirizine), Claritin (loratadine), Allegra (fexofenadine), or Xyzal (levocetirizine) daily as needed.  LPRD (laryngopharyngeal reflux disease) Sometimes flares after eating spicy foods.   Continue Prilosec 20mg  daily.  May take it twice a day on the days the reflux flares up.   Continue reflux diet - avoid spicy foods, alcohol, tomato based products.  Return in about 4 months (around 06/02/2019).  Diagnostics: None.  Medication List:  Current Outpatient Medications  Medication Sig Dispense Refill  . albuterol (PROAIR HFA) 108 (90 Base) MCG/ACT inhaler Inhale 2 puffs into the lungs every 4 (four) hours as needed for wheezing. 1 Inhaler 1  . ARIPiprazole (ABILIFY) 15 MG tablet Take 15 mg by mouth every  morning.    . Armodafinil 250 MG tablet Take 250 mg by mouth daily.   0  . aspirin 81 MG tablet Take 1 tablet (81 mg total) by mouth daily. (Patient taking differently: Take 325  mg by mouth daily. )    . BELSOMRA 20 MG TABS Take 20 mg by mouth at bedtime.   0  . DULoxetine (CYMBALTA) 60 MG capsule Take 60 mg by mouth daily.    . Evolocumab (REPATHA SURECLICK) 166 MG/ML SOAJ Inject 140 mg into the skin every 14 (fourteen) days. 2 pen 6  . ezetimibe (ZETIA) 10 MG tablet Take 1 tablet (10 mg total) by mouth daily. NEED OV. (Patient taking differently: Take 10 mg by mouth daily. ) 1 tablet 0  . finasteride (PROSCAR) 5 MG tablet Take 5 mg by mouth daily.     . fish oil-omega-3 fatty acids 1000 MG capsule Take 1 g by mouth at bedtime.     . fluticasone (FLOVENT HFA) 110 MCG/ACT inhaler Inhale 2 puffs into the lungs 2 (two) times daily. 12 g 5  . gemfibrozil (LOPID) 600 MG tablet Take 600 mg by mouth 2 (two) times daily.    . hydrOXYzine (ATARAX/VISTARIL) 50 MG tablet Take 150 mg by mouth at bedtime.     Marland Kitchen ipratropium-albuterol (DUONEB) 0.5-2.5 (3) MG/3ML SOLN Take 3 mLs by nebulization every 6 (six) hours as needed. 75 mL 3  . isosorbide mononitrate (IMDUR) 30 MG 24 hr tablet take 1 tablet by mouth once daily (Patient taking differently: Take 30 mg by mouth at bedtime) 15 tablet 0  . levothyroxine (SYNTHROID, LEVOTHROID) 25 MCG tablet Take 25 mcg by mouth daily before breakfast.     . mometasone-formoterol (DULERA) 200-5 MCG/ACT AERO Inhale 2 puffs into the lungs 2 (two) times daily. 1 Inhaler 5  . montelukast (SINGULAIR) 10 MG tablet TAKE 1 TABLET(10 MG) BY MOUTH AT BEDTIME (Patient taking differently: Take 10 mg by mouth at bedtime. ) 30 tablet 5  . Multiple Vitamin (MULTIVITAMIN) tablet Take 1 tablet by mouth at bedtime.     . nitroGLYCERIN (NITROSTAT) 0.4 MG SL tablet Place 1 tablet (0.4 mg total) under the tongue every 5 (five) minutes as needed for chest pain. 25 tablet 3  . omeprazole (PRILOSEC) 20 MG capsule TAKE 1 CAPSULE BY MOUTH TWICE A DAY AS DIRECTED (Patient taking differently: Take 20 mg by mouth at bedtime. ) 60 capsule 4  . silodosin (RAPAFLO) 8 MG CAPS capsule  Take 8 mg by mouth at bedtime.     . vitamin C (ASCORBIC ACID) 500 MG tablet Take 500 mg by mouth 2 (two) times daily.     [name] 150 MG injection INJECT 150MG  SUBCUTANEOUSLY EVERY 4 WEEKS (GIVEN AT PRESCRIBER OFFICE) (Patient taking differently: Inject 150 mg into the skin every 28 (twenty-eight) days. ) 1 vial 10   Current Facility-Administered Medications  Medication Dose Route Frequency Provider Last Rate Last Dose  . omalizumab [name]) injection 150 mg  150 mg Subcutaneous Q28 days Jiles Prows, MD   150 mg at 01/01/19 1509   Allergies: Allergies  Allergen Reactions  . 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 Penicillins Hives, Itching and Other (See Comments)    Has patient had a PCN reaction causing immediate rash,  facial/tongue/throat swelling, SOB or lightheadedness with hypotension: Unknown Has patient had a PCN reaction causing severe rash involving mucus membranes or skin necrosis: Unknown Has patient had a PCN reaction that required hospitalization: Unknown Has patient had a PCN reaction occurring within the last 10 years: No If all of the above answers are "NO", then may proceed with Cephalosporin use.   . Sulfonamide Derivatives Hives and Itching  . Codeine Nausea Only  . Statins Other (See Comments)    Myalgias   I reviewed his past medical history, social history, family history, and environmental history and no significant changes have been reported from previous visit on 01/14/2019.  Review of Systems  Constitutional: Negative for appetite change, chills, fever and unexpected weight change.  HENT: Negative for congestion and rhinorrhea.   Eyes: Negative for itching.  Respiratory: Negative for cough, chest tightness, shortness of breath and wheezing.   Cardiovascular: Negative for chest pain,  palpitations and leg swelling.  Gastrointestinal: Negative for abdominal pain.  Skin: Negative for rash.  Allergic/Immunologic: Positive for environmental allergies.  Neurological: Negative for dizziness and headaches.   Objective: Physical Exam Not obtained as encounter was done via telephone.   Previous notes and tests were reviewed.  I discussed the assessment and treatment plan with the patient. The patient was provided an opportunity to ask questions and all were answered. The patient agreed with the plan and demonstrated an understanding of the instructions. After visit summary/patient instructions available via mail.   The patient was advised to call back or seek an in-person evaluation if the symptoms worsen or if the condition fails to improve as anticipated.  I provided 10 minutes of non-face-to-face time during this encounter.  It was my pleasure to participate in Sohrab Keelan care today. Please feel free to contact me with any questions or concerns.   Sincerely,  Rexene Alberts, DO Allergy & Immunology  [facility] office: [phone] [facility] office: [phone]

## 2019-01-30 NOTE — Assessment & Plan Note (Signed)
Sometimes flares after eating spicy foods.   Continue Prilosec 20mg  daily.  May take it twice a day on the days the reflux flares up.   Continue reflux diet - avoid spicy foods, alcohol, tomato based products.

## 2019-01-30 NOTE — Assessment & Plan Note (Signed)
Past history - 2004 testing positive to ragweed, trees, mold and dust mites. Interim history - controlled.   Continue environmental control measures.   Continue Singulair 10mg  daily.  May use over the counter antihistamines such as Zyrtec (cetirizine), Claritin (loratadine), Allegra (fexofenadine), or Xyzal (levocetirizine) daily as needed.

## 2019-01-30 NOTE — Patient Instructions (Addendum)
We will call you next week when Xolair gets in. If you don't hear back from [place], please call the office before coming to get your injection to make sure we have it in our office.   Severe persistent asthma with acute exacerbation  Daily controller medication(s):continue Dulera 200 2 puffs twice a day with spacer and rinse mouth afterwards. ? Continue montelukast 10mg  daily. ? Continue Flovent 110 2 puffs twice a day with spacer and rinse mouth afterwards. ? Continue Xolair injections every 4 weeks  Prior to physical activity:May use albuterol rescue inhaler 2 puffs 5 to 15 minutes prior to strenuous physical activities.  Rescue medications:May use albuterol rescue inhaler 2 puffs or nebulizer every 4 to 6 hours as needed for shortness of breath, chest tightness, coughing, and wheezing. Monitor frequency of use.  Asthma control goals:  Full participation in all desired activities (may need albuterol before activity) Albuterol use two times or less a week on average (not counting use with activity) Cough interfering with sleep two times or less a month Oral steroids no more than once a year No hospitalizations  Other allergic rhinitis  Continue Singulair 10mg  daily.  May use over the counter antihistamines such as Zyrtec (cetirizine), Claritin (loratadine), Allegra (fexofenadine), or Xyzal (levocetirizine) daily as needed.  LPRD (laryngopharyngeal reflux disease)  Continue Prilosec 20mg  daily.  May take it twice a day on the days the reflux flares up.   Continue reflux diet - avoid spicy foods, alcohol, tomato based products.  Follow up in 4 months. Sincerely,  Rexene Alberts, DO Allergy & Immunology  [facility] office: [phone] [facility] office: [phone]  Reducing Pollen Exposure . Pollen seasons: trees (spring), grass (summer) and ragweed/weeds (fall). Marland Kitchen Keep windows closed in your home and car to lower pollen exposure.   Susa Simmonds air conditioning in the bedroom and throughout the house if possible.  . Avoid going out in dry windy days - especially early morning. . Pollen counts are highest between 5 - 10 AM and on dry, hot and windy days.  . Save outside activities for late afternoon or after a heavy rain, when pollen levels are lower.  . Avoid mowing of grass if you have grass pollen allergy. Marland Kitchen Be aware that pollen can also be transported indoors on people and pets.  . Dry your clothes in an automatic dryer rather than hanging them outside where they might collect pollen.  . Rinse hair and eyes before bedtime. Control of House Dust Mite Allergen . Dust mite allergens are a common trigger of allergy and asthma symptoms. While they can be found throughout the house, these microscopic creatures thrive in warm, humid environments such as bedding, upholstered furniture and carpeting. . Because so much time is spent in the bedroom, it is essential to reduce mite levels there.  . Encase pillows, mattresses, and box springs in special allergen-proof fabric covers or airtight, zippered plastic covers.  . Bedding should be washed weekly in hot water (130 F) and dried in a hot dryer. Allergen-proof covers are available for comforters and pillows that can't be regularly washed.  Wendee Copp the allergy-proof covers every few months. Minimize clutter in the bedroom. Keep pets out of the bedroom.  Marland Kitchen Keep humidity less than 50% by using a dehumidifier or air conditioning. You can buy a humidity measuring device called a hygrometer to monitor this.  . If possible, replace carpets with hardwood, linoleum, or washable area rugs. If that's  not possible, vacuum frequently with a vacuum that has a HEPA filter. . Remove all upholstered furniture and non-washable window drapes from the bedroom. . Remove all non-washable stuffed toys from the bedroom.  Wash stuffed toys weekly. Mold Control . Mold and fungi can grow on a variety of  surfaces provided certain temperature and moisture conditions exist.  . Outdoor molds grow on plants, decaying vegetation and soil. The major outdoor mold, Alternaria and Cladosporium, are found in very high numbers during hot and dry conditions. Generally, a late summer - fall peak is seen for common outdoor fungal spores. Rain will temporarily lower outdoor mold spore count, but counts rise rapidly when the rainy period ends. . The most important indoor molds are Aspergillus and Penicillium. Dark, humid and poorly ventilated basements are ideal sites for mold growth. The next most common sites of mold growth are the bathroom and the kitchen. Outdoor (Seasonal) Mold Control . Use air conditioning and keep windows closed. . Avoid exposure to decaying vegetation. Marland Kitchen Avoid leaf raking. . Avoid grain handling. . Consider wearing a face mask if working in moldy areas.  Indoor (Perennial) Mold Control  . Maintain humidity below 50%. . Get rid of mold growth on hard surfaces with water, detergent and, if necessary, 5% bleach (do not mix with other cleaners). Then dry the area completely. If mold covers an area more than 10 square feet, consider hiring an indoor environmental professional. . For clothing, washing with soap and water is best. If moldy items cannot be cleaned and dried, throw them away. . Remove sources e.g. contaminated carpets. . Repair and seal leaking roofs or pipes. Using dehumidifiers in damp basements may be helpful, but empty the water and clean units regularly to prevent mildew from forming. All rooms, especially basements, bathrooms and kitchens, require ventilation and cleaning to deter mold and mildew growth. Avoid carpeting on concrete or damp floors, and storing items in damp areas.

## 2019-02-14 ENCOUNTER — Ambulatory Visit (INDEPENDENT_AMBULATORY_CARE_PROVIDER_SITE_OTHER): Payer: Medicare Other

## 2019-02-14 DIAGNOSIS — J4540 Moderate persistent asthma, uncomplicated: Secondary | ICD-10-CM

## 2019-03-04 DIAGNOSIS — M5440 Lumbago with sciatica, unspecified side: Secondary | ICD-10-CM | POA: Insufficient documentation

## 2019-03-10 ENCOUNTER — Telehealth: Payer: Self-pay | Admitting: Allergy

## 2019-03-10 MED ORDER — PREDNISONE 10 MG PO TABS: ORAL_TABLET | ORAL | 0 refills | Status: DC

## 2019-03-10 NOTE — Telephone Encounter (Signed)
Last night at 3AM, patient woke up with an asthma attack. He use the albuterol nebulizer twice with some benefit but still having issues with coughing and wheezing. He is going out of town and was wondering if he could get a steroid injection. Told patient that our office is not open today for me to give him an injection. Advised him to go to urgent care for steroid injection. Will prescribe prednisone taper to take.   Continue all inhalers as previously and xolair injections. Will consider changing therapy as this is his 2nd flare in 2 months.

## 2019-03-14 ENCOUNTER — Other Ambulatory Visit: Payer: Self-pay

## 2019-03-14 ENCOUNTER — Ambulatory Visit: Payer: Self-pay

## 2019-03-14 ENCOUNTER — Ambulatory Visit: Payer: Medicare Other | Admitting: Allergy

## 2019-03-14 ENCOUNTER — Encounter: Payer: Self-pay | Admitting: Allergy

## 2019-03-14 VITALS — BP 158/96 | HR 87 | Temp 97.4°F | Resp 20 | Ht 67.0 in | Wt 209.8 lb

## 2019-03-14 DIAGNOSIS — K219 Gastro-esophageal reflux disease without esophagitis: Secondary | ICD-10-CM | POA: Diagnosis not present

## 2019-03-14 DIAGNOSIS — J4551 Severe persistent asthma with (acute) exacerbation: Secondary | ICD-10-CM

## 2019-03-14 DIAGNOSIS — J302 Other seasonal allergic rhinitis: Secondary | ICD-10-CM | POA: Diagnosis not present

## 2019-03-14 DIAGNOSIS — J4540 Moderate persistent asthma, uncomplicated: Secondary | ICD-10-CM

## 2019-03-14 DIAGNOSIS — J3089 Other allergic rhinitis: Secondary | ICD-10-CM | POA: Diagnosis not present

## 2019-03-14 MED ORDER — IPRATROPIUM-ALBUTEROL 0.5-2.5 (3) MG/3ML IN SOLN: 3.0000 mL | Freq: Four times a day (QID) | RESPIRATORY_TRACT | 3 refills | Status: DC | PRN

## 2019-03-14 MED ORDER — SPIRIVA RESPIMAT 1.25 MCG/ACT IN AERS: 2.0000 | INHALATION_SPRAY | Freq: Every day | RESPIRATORY_TRACT | 3 refills | Status: DC

## 2019-03-14 MED ORDER — FLOVENT HFA 110 MCG/ACT IN AERO: 2.0000 | INHALATION_SPRAY | Freq: Two times a day (BID) | RESPIRATORY_TRACT | 5 refills | Status: DC

## 2019-03-14 MED ORDER — DULERA 200-5 MCG/ACT IN AERO: 2.0000 | INHALATION_SPRAY | Freq: Two times a day (BID) | RESPIRATORY_TRACT | 1 refills | Status: DC

## 2019-03-14 MED ORDER — METHYLPREDNISOLONE ACETATE 40 MG/ML IJ SUSP
40.0000 mg | Freq: Once | INTRAMUSCULAR | Status: AC
  Administered 2019-03-14: 40 mg via INTRAMUSCULAR

## 2019-03-14 NOTE — Patient Instructions (Addendum)
Severe persistent asthma  Finish prednisone taper.   Use albuterol nebulizer every 4-6 hours while awake for the next few days.   If you develop fevers or have worsening symptoms let us know.   Daily controller medication(s): ? Start Spiriva 1.46mcg 2 puffs daily. Sample given.  ? Continue Dulera 200 2 puffs twice a day with spacer and rinse mouth afterwards. ? Continue montelukast 10mg  daily. ? Continue Flovent 110 2 puffs twice a day with spacer and rinse mouth afterwards. ? Continue Xolair injections every 4 weeks.  Prior to physical activity:May use albuterol rescue inhaler 2 puffs 5 to 15 minutes prior to strenuous physical activities.  Rescue medications:May use albuterol rescue inhaler 2 puffs or nebulizer every 4 to 6 hours as needed for shortness of breath, chest tightness, coughing, and wheezing. Monitor frequency of use. Asthma control goals:  Full participation in all desired activities (may need albuterol before activity) Albuterol use two times or less a week on average (not counting use with activity) Cough interfering with sleep two times or less a month Oral steroids no more than once a year No hospitalizations  Seasonal and perennial allergic rhinitis Past history - 2004 testing positive to ragweed, trees, mold and dust mites.  Continue environmental control measures.   Continue Singulair 10mg  daily.  May use over the counter antihistamines such as Zyrtec (cetirizine), Claritin (loratadine), Allegra (fexofenadine), or Xyzal (levocetirizine) daily as needed.  LPRD (laryngopharyngeal reflux disease)  Continue Prilosec 20mg  daily.  May take it twice a day on the days the reflux flares up.   Continue reflux diet - avoid spicy foods, alcohol, tomato based products.  Follow up in 4 weeks

## 2019-03-14 NOTE — Progress Notes (Signed)
Follow Up Note  RE: Jared [name] MRN: [id number] DOB: 1951/07/13 Date of Office Visit: 03/14/2019  Referring provider: Burnard Bunting, MD Primary care provider: Burnard Bunting, MD  Chief Complaint: Asthma (big flare hard time breathing)  History of Present Illness: I had the pleasure of seeing Jared [name] on 03/14/2019. He is a 68 y.o. male, who is being followed for asthma, allergic rhinitis, LPRD. Today he is here for new complaint of trouble breathing.His previous allergy office visit was on 01/30/2019 with Dr. Maudie [name].   Severe persistent asthma Patient has been taking oral prednisone since 6/21 with minimal benefit and still having issues with coughing,wheezing and shortness of breath. Currently using albuterol nebulizer every 4-6 hours with some benefit.  Still taking Dulera 200 2 puffs BID, Flovent 110 2 puffs BID, Singulair daily and Xolair injections. He is due for Xolair today which is helping. He was still able to go fishing to the [employer] this week.  This episode was triggered by his acid reflux as he ate something spicy. Still on Prilosec BID. Denies any fevers or chills. Last flare was in April. Usually needs steroid injection for symptoms to clear up.   Seasonal and perennial allergic rhinitis Controlled.   LPRD (laryngopharyngeal reflux disease) Sometimes flares after eating spicy foods and now taking Prilosec BID.  Assessment and Plan: Jared [name] is a 68 y.o. male with: Severe persistent asthma with acute exacerbation Oral prednisone not helping and still having symptoms requiring nebulizer multiple times a day.   Finish prednisone taper.   IM depo 40mg  given today and Xolair given today.  Use albuterol nebulizer every 4-6 hours while awake for the next few days.   If you develop fevers or have worsening symptoms let us know.   Daily controller medication(s): ? Start Spiriva 1.27mcg 2  puffs daily. Sample given. Demonstrated proper use.  ? Continue Dulera 200 2 puffs twice a day with spacer and rinse mouth afterwards. ? Continue montelukast 10mg  daily. ? Continue Flovent 110 2 puffs twice a day with spacer and rinse mouth afterwards. ? Continue Xolair injections every 4 weeks.  Prior to physical activity:May use albuterol rescue inhaler 2 puffs 5 to 15 minutes prior to strenuous physical activities.  Rescue medications:May use albuterol rescue inhaler 2 puffs or nebulizer every 4 to 6 hours as needed for shortness of breath, chest tightness, coughing, and wheezing. Monitor frequency of use.  Will get spirometry at next visit instead of today due to COVID-19 pandemic and trying to minimize any type of aerosolizing procedures at this time in the office.   Seasonal and perennial allergic rhinitis Past history - 2004 testing positive to ragweed, trees, mold and dust mites. Interim history - controlled.   Continue environmental control measures.   Continue Singulair 10mg  daily.  May use over the counter antihistamines such as Zyrtec (cetirizine), Claritin (loratadine), Allegra (fexofenadine), or Xyzal (levocetirizine) daily as needed.  LPRD (laryngopharyngeal reflux disease) Sometimes flares after eating spicy foods which can trigger his asthma.   Continue Prilosec 20mg  daily.  May take it twice a day on the days the reflux flares up.   Continue reflux diet - avoid spicy foods, alcohol, tomato based products.  Return in about 4 weeks (around 04/11/2019).  Meds ordered this encounter  Medications  . fluticasone (FLOVENT HFA) 110 MCG/ACT inhaler    Sig: Inhale 2 puffs into the lungs 2 (two) times daily.  Dispense:  12 g    Refill:  5  . mometasone-formoterol (DULERA) 200-5 MCG/ACT AERO    Sig: Inhale 2 puffs into the lungs 2 (two) times daily.    Dispense:  13 g    Refill:  1  . Tiotropium Bromide Monohydrate (SPIRIVA RESPIMAT) 1.25 MCG/ACT AERS    Sig:  Inhale 2 puffs into the lungs daily.    Dispense:  4 g    Refill:  3  . ipratropium-albuterol (DUONEB) 0.5-2.5 (3) MG/3ML SOLN    Sig: Take 3 mLs by nebulization every 6 (six) hours as needed (wheezing, coughing, shortness of breath).    Dispense:  75 mL    Refill:  3  . methylPREDNISolone acetate (DEPO-MEDROL) injection 40 mg   Diagnostics: None.  Medication List:  Current Outpatient Medications  Medication Sig Dispense Refill  . albuterol (PROAIR HFA) 108 (90 Base) MCG/ACT inhaler Inhale 2 puffs into the lungs every 4 (four) hours as needed for wheezing. 1 Inhaler 1  . ARIPiprazole (ABILIFY) 15 MG tablet Take 15 mg by mouth every morning.    . Armodafinil 250 MG tablet Take 250 mg by mouth daily.   0  . aspirin 81 MG tablet Take 1 tablet (81 mg total) by mouth daily. (Patient taking differently: Take 325 mg by mouth daily. )    . BELSOMRA 20 MG TABS Take 20 mg by mouth at bedtime.   0  . DULoxetine (CYMBALTA) 60 MG capsule Take 60 mg by mouth daily.    . Evolocumab (REPATHA SURECLICK) 400 MG/ML SOAJ Inject 140 mg into the skin every 14 (fourteen) days. 2 pen 6  . ezetimibe (ZETIA) 10 MG tablet Take 1 tablet (10 mg total) by mouth daily. NEED OV. (Patient taking differently: Take 10 mg by mouth daily. ) 1 tablet 0  . finasteride (PROSCAR) 5 MG tablet Take 5 mg by mouth daily.     . fish oil-omega-3 fatty acids 1000 MG capsule Take 1 g by mouth at bedtime.     . fluticasone (FLOVENT HFA) 110 MCG/ACT inhaler Inhale 2 puffs into the lungs 2 (two) times daily. 12 g 5  . gemfibrozil (LOPID) 600 MG tablet Take 600 mg by mouth 2 (two) times daily.    . hydrOXYzine (ATARAX/VISTARIL) 50 MG tablet Take 150 mg by mouth at bedtime.     Marland Kitchen ipratropium-albuterol (DUONEB) 0.5-2.5 (3) MG/3ML SOLN Take 3 mLs by nebulization every 6 (six) hours as needed (wheezing, coughing, shortness of breath). 75 mL 3  . isosorbide mononitrate (IMDUR) 30 MG 24 hr tablet take 1 tablet by mouth once daily (Patient taking  differently: Take 30 mg by mouth at bedtime) 15 tablet 0  . levothyroxine (SYNTHROID, LEVOTHROID) 25 MCG tablet Take 25 mcg by mouth daily before breakfast.     . mometasone-formoterol (DULERA) 200-5 MCG/ACT AERO Inhale 2 puffs into the lungs 2 (two) times daily. 13 g 1  . montelukast (SINGULAIR) 10 MG tablet TAKE 1 TABLET(10 MG) BY MOUTH AT BEDTIME (Patient taking differently: Take 10 mg by mouth at bedtime. ) 30 tablet 5  . Multiple Vitamin (MULTIVITAMIN) tablet Take 1 tablet by mouth at bedtime.     . nitroGLYCERIN (NITROSTAT) 0.4 MG SL tablet Place 1 tablet (0.4 mg total) under the tongue every 5 (five) minutes as needed for chest pain. 25 tablet 3  . omeprazole (PRILOSEC) 20 MG capsule TAKE 1 CAPSULE BY MOUTH TWICE A DAY AS DIRECTED (Patient taking differently: Take 20 mg by mouth at  bedtime. ) 60 capsule 4  . predniSONE (DELTASONE) 10 MG tablet Take prednisone 40mg  daily x 2 days, 30mg  daily x 2 days, 20mg  daily x 2 days and 10mg  daily x 2 days. 20 tablet 0  . silodosin (RAPAFLO) 8 MG CAPS capsule Take 8 mg by mouth at bedtime.     . vitamin C (ASCORBIC ACID) 500 MG tablet Take 500 mg by mouth 2 (two) times daily.     [name] 150 MG injection INJECT 150MG  SUBCUTANEOUSLY EVERY 4 WEEKS (GIVEN AT PRESCRIBER OFFICE) (Patient taking differently: Inject 150 mg into the skin every 28 (twenty-eight) days. ) 1 vial 10  . Tiotropium Bromide Monohydrate (SPIRIVA RESPIMAT) 1.25 MCG/ACT AERS Inhale 2 puffs into the lungs daily. 4 g 3   Current Facility-Administered Medications  Medication Dose Route Frequency Provider Last Rate Last Dose  . omalizumab [name]) injection 150 mg  150 mg Subcutaneous Q28 days Jiles Prows, MD   150 mg at 03/14/19 1027   Allergies: Allergies  Allergen Reactions  . 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  Penicillins Hives, Itching and Other (See Comments)    Has patient had a PCN reaction causing immediate rash, facial/tongue/throat swelling, SOB or lightheadedness with hypotension: Unknown Has patient had a PCN reaction causing severe rash involving mucus membranes or skin necrosis: Unknown Has patient had a PCN reaction that required hospitalization: Unknown Has patient had a PCN reaction occurring within the last 10 years: No If all of the above answers are "NO", then may proceed with Cephalosporin use.   . Sulfonamide Derivatives Hives and Itching  . Codeine Nausea Only  . Statins Other (See Comments)    Myalgias   I reviewed his past medical history, social history, family history, and environmental history and no significant changes have been reported from previous visit on 01/30/2019.  Review of Systems  Constitutional: Negative for appetite change, chills, fever and unexpected weight change.  HENT: Negative for congestion and rhinorrhea.   Eyes: Negative for itching.  Respiratory: Positive for cough, chest tightness, shortness of breath and wheezing.   Cardiovascular: Negative for chest pain.  Gastrointestinal: Negative for abdominal pain.  Skin: Negative for rash.  Allergic/Immunologic: Positive for environmental allergies.  Neurological: Negative for dizziness and headaches.   Objective: BP (!) 158/96 (BP Location: Right Arm, Patient Position: Sitting)   Pulse 87   Temp (!) 97.4 F (36.3 C)   Resp 20   Ht 5\' 7"  (1.702 m)   Wt 209 lb 12.8 oz (95.2 kg)   SpO2 95% Comment: no mask on  BMI 32.86 kg/m  Body mass index is 32.86 kg/m. Physical Exam  Constitutional: He is oriented to person, place, and time. He appears well-developed and well-nourished.  HENT:  Head: Normocephalic and atraumatic.  Right Ear: External ear normal.  Left Ear: External ear normal.  Nose: Nose normal.  Mouth/Throat: Oropharynx is clear and moist.  Eyes: Conjunctivae and EOM are normal.   Neck: Neck supple.  Cardiovascular: Normal rate, regular rhythm and normal heart sounds. Exam reveals no gallop and no friction rub.  No murmur heard. Pulmonary/Chest: Effort normal. He has no wheezes. He has rales.  Neurological: He is alert and oriented to person, place, and time.  Skin: Skin is warm.  Psychiatric: He  has a normal mood and affect. His behavior is normal.  Nursing note and vitals reviewed.  Previous notes and tests were reviewed. The plan was reviewed with the patient/family, and all questions/concerned were addressed.  It was my pleasure to see Arav today and participate in his care. Please feel free to contact me with any questions or concerns.  Sincerely,  Rexene Alberts, DO Allergy & Immunology  [facility] office: [phone] [facility] [place] [place] office: [phone]

## 2019-03-14 NOTE — Assessment & Plan Note (Signed)
Oral prednisone not helping and still having symptoms requiring nebulizer multiple times a day.   Finish prednisone taper.   IM depo 40mg  given today and Xolair given today.  Use albuterol nebulizer every 4-6 hours while awake for the next few days.   If you develop fevers or have worsening symptoms let us know.   Daily controller medication(s): ? Start Spiriva 1.89mcg 2 puffs daily. Sample given. Demonstrated proper use.  ? Continue Dulera 200 2 puffs twice a day with spacer and rinse mouth afterwards. ? Continue montelukast 10mg  daily. ? Continue Flovent 110 2 puffs twice a day with spacer and rinse mouth afterwards. ? Continue Xolair injections every 4 weeks.  Prior to physical activity:May use albuterol rescue inhaler 2 puffs 5 to 15 minutes prior to strenuous physical activities.  Rescue medications:May use albuterol rescue inhaler 2 puffs or nebulizer every 4 to 6 hours as needed for shortness of breath, chest tightness, coughing, and wheezing. Monitor frequency of use.  Will get spirometry at next visit instead of today due to COVID-19 pandemic and trying to minimize any type of aerosolizing procedures at this time in the office.

## 2019-03-14 NOTE — Assessment & Plan Note (Signed)
Sometimes flares after eating spicy foods which can trigger his asthma.   Continue Prilosec 20mg  daily.  May take it twice a day on the days the reflux flares up.   Continue reflux diet - avoid spicy foods, alcohol, tomato based products.

## 2019-03-14 NOTE — Assessment & Plan Note (Signed)
Past history - 2004 testing positive to ragweed, trees, mold and dust mites. Interim history - controlled.   Continue environmental control measures.   Continue Singulair 10mg  daily.  May use over the counter antihistamines such as Zyrtec (cetirizine), Claritin (loratadine), Allegra (fexofenadine), or Xyzal (levocetirizine) daily as needed.

## 2019-04-09 ENCOUNTER — Other Ambulatory Visit: Payer: Self-pay | Admitting: Allergy and Immunology

## 2019-04-09 DIAGNOSIS — J4551 Severe persistent asthma with (acute) exacerbation: Secondary | ICD-10-CM

## 2019-04-11 ENCOUNTER — Ambulatory Visit (INDEPENDENT_AMBULATORY_CARE_PROVIDER_SITE_OTHER): Payer: Medicare Other | Admitting: *Deleted

## 2019-04-11 ENCOUNTER — Other Ambulatory Visit: Payer: Self-pay

## 2019-04-11 DIAGNOSIS — J4540 Moderate persistent asthma, uncomplicated: Secondary | ICD-10-CM

## 2019-05-03 ENCOUNTER — Ambulatory Visit (INDEPENDENT_AMBULATORY_CARE_PROVIDER_SITE_OTHER): Payer: Medicare Other

## 2019-05-03 ENCOUNTER — Other Ambulatory Visit: Payer: Self-pay

## 2019-05-03 ENCOUNTER — Encounter: Payer: Self-pay | Admitting: Podiatry

## 2019-05-03 ENCOUNTER — Ambulatory Visit: Payer: Medicare Other | Admitting: Podiatry

## 2019-05-03 ENCOUNTER — Other Ambulatory Visit: Payer: Self-pay | Admitting: Podiatry

## 2019-05-03 VITALS — Temp 97.2°F

## 2019-05-03 DIAGNOSIS — M79672 Pain in left foot: Secondary | ICD-10-CM

## 2019-05-03 DIAGNOSIS — M779 Enthesopathy, unspecified: Secondary | ICD-10-CM | POA: Diagnosis not present

## 2019-05-03 DIAGNOSIS — M722 Plantar fascial fibromatosis: Secondary | ICD-10-CM | POA: Diagnosis not present

## 2019-05-03 DIAGNOSIS — M79671 Pain in right foot: Secondary | ICD-10-CM

## 2019-05-03 NOTE — Progress Notes (Signed)
Subjective:   Patient ID: Jared [name], male   DOB: 68 y.o.   MRN: [id number]   HPI Patient presents with inflammation of the lesser MPJs of both feet stating that they have been very sore for the last few months and also moderate discomfort in the heels of both feet.  States that this is been ongoing and becoming more of an issue   ROS      Objective:  Physical Exam  Neurovascular status intact with inflammation of the second and third MPJs of both feet and inflammation into the plantar heel bilateral     Assessment:  2 separate problems with one being what appears to be acute capsulitis of the lesser MPJs and the second being plantar fascial inflammation     Plan:  H&P x-rays reviewed discussed both conditions and at this point and get a focus on the capsule and educated him on plantar fasciitis and that I may need to treat him later for that but I did explain exercises for him to do.  Today I did sterile prep of each foot and injected the second and third MPJs 3 mg Dexasone Kenalog 93M Xylocaine total and then went ahead and discussed supportive shoes and will review how he does in a couple weeks and consider treatment of the heels  X-rays indicate that there is inflammatory changes but no indications of stress fracture or arthritis

## 2019-05-06 ENCOUNTER — Ambulatory Visit (INDEPENDENT_AMBULATORY_CARE_PROVIDER_SITE_OTHER): Payer: Medicare Other | Admitting: Podiatry

## 2019-05-06 ENCOUNTER — Other Ambulatory Visit: Payer: Self-pay

## 2019-05-06 DIAGNOSIS — L03119 Cellulitis of unspecified part of limb: Secondary | ICD-10-CM | POA: Diagnosis not present

## 2019-05-06 DIAGNOSIS — L02619 Cutaneous abscess of unspecified foot: Secondary | ICD-10-CM

## 2019-05-06 DIAGNOSIS — M79671 Pain in right foot: Secondary | ICD-10-CM | POA: Diagnosis not present

## 2019-05-06 DIAGNOSIS — M79672 Pain in left foot: Secondary | ICD-10-CM | POA: Diagnosis not present

## 2019-05-06 DIAGNOSIS — L309 Dermatitis, unspecified: Secondary | ICD-10-CM | POA: Diagnosis not present

## 2019-05-06 MED ORDER — PREDNISONE 10 MG PO TABS: ORAL_TABLET | ORAL | 0 refills | Status: DC

## 2019-05-06 MED ORDER — TERBINAFINE HCL 250 MG PO TABS: 250.0000 mg | ORAL_TABLET | Freq: Every day | ORAL | 0 refills | Status: DC

## 2019-05-06 MED ORDER — MUPIROCIN CALCIUM 2 % EX CREA: 1.0000 "application " | TOPICAL_CREAM | Freq: Two times a day (BID) | CUTANEOUS | 0 refills | Status: DC

## 2019-05-08 NOTE — Progress Notes (Signed)
Subjective:   Patient ID: Jared [name], male   DOB: 68 y.o.   MRN: [id number]   HPI Patient has developed a lot of irritation between the second third and third and fourth toes of both feet and also the fourth and fifth of the right foot.  Patient states that he started taking Lasix and that it started this afterwards and it is moderately painful   ROS      Objective:  Physical Exam  I noted there to be breakdown of tissue between the lesser digits of both feet with redness of the localized nature no proximal edema erythema or drainage noted currently     Assessment:  Possibility for fungal infection reaction to Lasix for inflammatory condition which may have occurred.     Plan:  H&P reviewed condition and as precautionary measure I started on soaks and Bactroban ointment for the areas.  I then went ahead and I also placed on oral Lamisil for 30 days with first a steroid pack due to the possibility for inflammatory condition.  I gave strict instructions if any changes in pathology were to occur or any systemic signs of infection or pathology were to occur patient is to contact us immediately but I am hopeful that this will settle down the problem

## 2019-05-09 ENCOUNTER — Ambulatory Visit (INDEPENDENT_AMBULATORY_CARE_PROVIDER_SITE_OTHER): Payer: Medicare Other | Admitting: *Deleted

## 2019-05-09 DIAGNOSIS — J4540 Moderate persistent asthma, uncomplicated: Secondary | ICD-10-CM | POA: Diagnosis not present

## 2019-05-17 ENCOUNTER — Telehealth: Payer: Self-pay | Admitting: Cardiovascular Disease

## 2019-05-17 ENCOUNTER — Encounter: Payer: Self-pay | Admitting: Podiatry

## 2019-05-17 ENCOUNTER — Ambulatory Visit: Payer: Medicare Other | Admitting: Podiatry

## 2019-05-17 ENCOUNTER — Other Ambulatory Visit: Payer: Self-pay

## 2019-05-17 DIAGNOSIS — M79675 Pain in left toe(s): Secondary | ICD-10-CM

## 2019-05-17 DIAGNOSIS — L309 Dermatitis, unspecified: Secondary | ICD-10-CM

## 2019-05-17 DIAGNOSIS — M79674 Pain in right toe(s): Secondary | ICD-10-CM | POA: Diagnosis not present

## 2019-05-17 DIAGNOSIS — B351 Tinea unguium: Secondary | ICD-10-CM

## 2019-05-17 NOTE — Telephone Encounter (Signed)
PA for Rep[employer] approved on 8/3 until 09/19/2019. Pharmacy called to troubleshoot problem.   Approval letter faxed to pharmacy

## 2019-05-17 NOTE — Telephone Encounter (Signed)
[employer] PA information.

## 2019-05-17 NOTE — Telephone Encounter (Signed)
[facility] called to make sure that our office received the fax for the patient's Prior Auth for his [place].  All return  instructions were included on the forms Please contact the pharmacy if the forms were not received.

## 2019-05-20 NOTE — Progress Notes (Signed)
Subjective:   Patient ID: Jared [name], male   DOB: 68 y.o.   MRN: [id number]   HPI Patient presents with 2 separate problems 1 being irritation between the digits which was associated with redness and itching and other being thick yellow brittle nailbeds that he cannot cut and needs to have done on a routine basis   ROS      Objective:  Physical Exam  Neurovascular status intact negative Homans sign was noted patient's redness clearing quite a bit between the digits with some irritation of tissue noted but overall better with medication and the starting of oral antifungal.  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  Today I debrided nailbeds 1-5 both feet taking out corners to reduce pressure and he will see [place] on a routine basis to have this done and is advised to come in for earlier if any issues were to occur

## 2019-05-22 ENCOUNTER — Other Ambulatory Visit: Payer: Self-pay

## 2019-05-22 MED ORDER — REPATHA SURECLICK 140 MG/ML ~~LOC~~ SOAJ: 140.0000 mg | SUBCUTANEOUS | 6 refills | Status: DC

## 2019-05-28 NOTE — Telephone Encounter (Signed)
Talked to [facility] resolution department.  Approval letter faxed (again) for verification.

## 2019-06-03 ENCOUNTER — Telehealth: Payer: Self-pay

## 2019-06-03 NOTE — Telephone Encounter (Signed)
Called and lmomed regarding switching to praluent due to the pt's new insurance w/[employer]. I will submit for a new pa and await determination.

## 2019-06-04 ENCOUNTER — Telehealth: Payer: Self-pay | Admitting: Cardiovascular Disease

## 2019-06-04 ENCOUNTER — Other Ambulatory Visit: Payer: Self-pay | Admitting: Allergy and Immunology

## 2019-06-04 DIAGNOSIS — J4551 Severe persistent asthma with (acute) exacerbation: Secondary | ICD-10-CM

## 2019-06-04 MED ORDER — PRALUENT 150 MG/ML ~~LOC~~ SOAJ: 150.0000 mg | SUBCUTANEOUS | 6 refills | Status: DC

## 2019-06-04 NOTE — Telephone Encounter (Signed)
New Message     Jared [name] is calling from [facility]  to check the status of Reptha  She says they faxed it on 09/05

## 2019-06-04 NOTE — Telephone Encounter (Signed)
Please advise on repatha, thank you!

## 2019-06-05 ENCOUNTER — Ambulatory Visit: Payer: Medicare Other | Admitting: Allergy

## 2019-06-05 ENCOUNTER — Telehealth: Payer: Self-pay

## 2019-06-05 ENCOUNTER — Ambulatory Visit (INDEPENDENT_AMBULATORY_CARE_PROVIDER_SITE_OTHER): Payer: Medicare Other | Admitting: *Deleted

## 2019-06-05 DIAGNOSIS — J4540 Moderate persistent asthma, uncomplicated: Secondary | ICD-10-CM

## 2019-06-05 NOTE — Progress Notes (Deleted)
Follow Up Note  RE: ROMUALD SHA MRN: [id number] DOB: 08-04-1951 Date of Office Visit: 06/05/2019  Referring provider: Burnard Bunting, MD Primary care provider: Burnard Bunting, MD  Chief Complaint: No chief complaint on file.  History of Present Illness: I had the pleasure of seeing Curly Datu for a follow up visit at the [facility] on 06/05/2019. He is a 68 y.o. male, who is being followed for asthma, allergic rhinitis and LPRD. Today he is here for regular follow up visit. His previous allergy office visit was on 03/14/2019 with Dr. [name].   Severe persistent asthma with acute exacerbation Oral prednisone not helping and still having symptoms requiring nebulizer multiple times a day.   Finish prednisone taper.   IM depo 40mg  given today and Xolair given today.  Use albuterol nebulizer every 4-6 hours while awake for the next few days.   If you develop fevers or have worsening symptoms let us know.   Daily controller medication(s): ? Start Spiriva 1.86mcg 2 puffs daily. Sample given. Demonstrated proper use.  ? Continue Dulera 200 2 puffs twice a day with spacer and rinse mouth afterwards. ? Continue montelukast 10mg  daily. ? Continue Flovent 110 2 puffs twice a day with spacer and rinse mouth afterwards. ? Continue Xolair injections every 4 weeks.  Prior to physical activity:May use albuterol rescue inhaler 2 puffs 5 to 15 minutes prior to strenuous physical activities.  Rescue medications:May use albuterol rescue inhaler 2 puffs or nebulizer every 4 to 6 hours as needed for shortness of breath, chest tightness, coughing, and wheezing. Monitor frequency of use.  Will get spirometry at next visit instead of today due to COVID-19 pandemic and trying to minimize any type of aerosolizing procedures at this time in the office.   Seasonal and perennial allergic rhinitis Past history - 2004 testing positive to ragweed, trees, mold and dust mites.  Interim history - controlled.   Continue environmental control measures.   Continue Singulair 10mg  daily.  May use over the counter antihistamines such as Zyrtec (cetirizine), Claritin (loratadine), Allegra (fexofenadine), or Xyzal (levocetirizine) daily as needed.  LPRD (laryngopharyngeal reflux disease) Sometimes flares after eating spicy foods which can trigger his asthma.   Continue Prilosec 20mg  daily.  May take it twice a day on the days the reflux flares up.   Continue reflux diet - avoid spicy foods, alcohol, tomato based products.  Return in about 4 weeks (around 04/11/2019).  Assessment and Plan: Lakai is a 68 y.o. male with: No problem-specific Assessment & Plan notes found for this encounter.  No follow-ups on file.  No orders of the defined types were placed in this encounter.  Lab Orders  No laboratory test(s) ordered today    Diagnostics: Spirometry:  Tracings reviewed. His effort: {Blank single:19197::"Good reproducible efforts.","It was hard to get consistent efforts and there is a question as to whether this reflects a maximal maneuver.","Poor effort, data can not be interpreted."} FVC: ***L FEV1: ***L, ***% predicted FEV1/FVC ratio: ***% Interpretation: {Blank single:19197::"Spirometry consistent with mild obstructive disease","Spirometry consistent with moderate obstructive disease","Spirometry consistent with severe obstructive disease","Spirometry consistent with possible restrictive disease","Spirometry consistent with mixed obstructive and restrictive disease","Spirometry uninterpretable due to technique","Spirometry consistent with normal pattern","No overt abnormalities noted given today's efforts"}.  Please see scanned spirometry results for details.  Skin Testing: {Blank single:19197::"Select foods","Environmental allergy panel","Environmental allergy panel and select foods","Food allergy panel","None","Deferred due to recent antihistamines use"}.  Positive test to: ***. Negative test to: ***.  Results  discussed with patient/family.   Medication List:  Current Outpatient Medications  Medication Sig Dispense Refill  . albuterol (VENTOLIN HFA) 108 (90 Base) MCG/ACT inhaler INHALE 2 PUFFS INTO THE LUNGS EVERY 4 HOURS AS NEEDED FOR WHEEZING 6.7 g 0  . Alirocumab (PRALUENT) 150 MG/ML SOAJ Inject 150 mg into the skin every 14 (fourteen) days. 2 pen 6  . ARIPiprazole (ABILIFY) 15 MG tablet Take 15 mg by mouth every morning.    . Armodafinil 250 MG tablet Take 250 mg by mouth daily.   0  . aspirin 81 MG tablet Take 1 tablet (81 mg total) by mouth daily. (Patient taking differently: Take 325 mg by mouth daily. )    . BELSOMRA 20 MG TABS Take 20 mg by mouth at bedtime.   0  . DULoxetine (CYMBALTA) 60 MG capsule Take 60 mg by mouth daily.    Marland Kitchen ezetimibe (ZETIA) 10 MG tablet Take 1 tablet (10 mg total) by mouth daily. NEED OV. (Patient taking differently: Take 10 mg by mouth daily. ) 1 tablet 0  . finasteride (PROSCAR) 5 MG tablet Take 5 mg by mouth daily.     . fish oil-omega-3 fatty acids 1000 MG capsule Take 1 g by mouth at bedtime.     . fluticasone (FLOVENT HFA) 110 MCG/ACT inhaler Inhale 2 puffs into the lungs 2 (two) times daily. 12 g 5  . gemfibrozil (LOPID) 600 MG tablet Take 600 mg by mouth 2 (two) times daily.    . hydrOXYzine (ATARAX/VISTARIL) 50 MG tablet Take 150 mg by mouth at bedtime.     Marland Kitchen ipratropium-albuterol (DUONEB) 0.5-2.5 (3) MG/3ML SOLN Take 3 mLs by nebulization every 6 (six) hours as needed (wheezing, coughing, shortness of breath). 75 mL 3  . isosorbide mononitrate (IMDUR) 30 MG 24 hr tablet take 1 tablet by mouth once daily (Patient taking differently: Take 30 mg by mouth at bedtime) 15 tablet 0  . levothyroxine (SYNTHROID, LEVOTHROID) 25 MCG tablet Take 25 mcg by mouth daily before breakfast.     . mometasone-formoterol (DULERA) 200-5 MCG/ACT AERO Inhale 2 puffs into the lungs 2 (two) times daily. 13 g 1  . montelukast  (SINGULAIR) 10 MG tablet TAKE 1 TABLET(10 MG) BY MOUTH AT BEDTIME 30 tablet 0  . Multiple Vitamin (MULTIVITAMIN) tablet Take 1 tablet by mouth at bedtime.     . mupirocin cream (BACTROBAN) 2 % Apply 1 application topically 2 (two) times daily. 15 g 0  . nitroGLYCERIN (NITROSTAT) 0.4 MG SL tablet Place 1 tablet (0.4 mg total) under the tongue every 5 (five) minutes as needed for chest pain. 25 tablet 3  . omeprazole (PRILOSEC) 20 MG capsule TAKE 1 CAPSULE BY MOUTH TWICE A DAY AS DIRECTED (Patient taking differently: Take 20 mg by mouth at bedtime. ) 60 capsule 4  . silodosin (RAPAFLO) 8 MG CAPS capsule Take 8 mg by mouth at bedtime.     . terbinafine (LAMISIL) 250 MG tablet Take 1 tablet (250 mg total) by mouth daily. 30 tablet 0  . Tiotropium Bromide Monohydrate (SPIRIVA RESPIMAT) 1.25 MCG/ACT AERS Inhale 2 puffs into the lungs daily. 4 g 3  . vitamin C (ASCORBIC ACID) 500 MG tablet Take 500 mg by mouth 2 (two) times daily.     [name] 150 MG injection INJECT 150MG  SUBCUTANEOUSLY EVERY 4 WEEKS (GIVEN AT PRESCRIBER OFFICE) (Patient taking differently: Inject 150 mg into the skin every 28 (twenty-eight) days. ) 1 vial 10   Current Facility-Administered Medications  Medication  Dose Route Frequency Provider Last Rate Last Dose  . omalizumab [name]) injection 150 mg  150 mg Subcutaneous Q28 days Jiles Prows, MD   150 mg at 05/09/19 1130   Allergies: Allergies  Allergen Reactions  . 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 Penicillins Hives, Itching and Other (See Comments)    Has patient had a PCN reaction causing immediate rash, facial/tongue/throat swelling, SOB or lightheadedness with hypotension: Unknown Has patient had a PCN reaction causing severe rash involving mucus membranes or skin necrosis: Unknown Has patient had a PCN  reaction that required hospitalization: Unknown Has patient had a PCN reaction occurring within the last 10 years: No If all of the above answers are "NO", then may proceed with Cephalosporin use.   . Sulfonamide Derivatives Hives and Itching  . Codeine Nausea Only  . Statins Other (See Comments)    Myalgias   I reviewed his past medical history, social history, family history, and environmental history and no significant changes have been reported from previous visit on 03/14/2019.  Review of Systems  Constitutional: Negative for appetite change, chills, fever and unexpected weight change.  HENT: Negative for congestion and rhinorrhea.   Eyes: Negative for itching.  Respiratory: Positive for cough, chest tightness, shortness of breath and wheezing.   Cardiovascular: Negative for chest pain.  Gastrointestinal: Negative for abdominal pain.  Skin: Negative for rash.  Allergic/Immunologic: Positive for environmental allergies.  Neurological: Negative for dizziness and headaches.   Objective: There were no vitals taken for this visit. There is no height or weight on file to calculate BMI. Physical Exam  Constitutional: He is oriented to person, place, and time. He appears well-developed and well-nourished.  HENT:  Head: Normocephalic and atraumatic.  Right Ear: External ear normal.  Left Ear: External ear normal.  Nose: Nose normal.  Mouth/Throat: Oropharynx is clear and moist.  Eyes: Conjunctivae and EOM are normal.  Neck: Neck supple.  Cardiovascular: Normal rate, regular rhythm and normal heart sounds. Exam reveals no gallop and no friction rub.  No murmur heard. Pulmonary/Chest: Effort normal. He has no wheezes. He has rales.  Neurological: He is alert and oriented to person, place, and time.  Skin: Skin is warm.  Psychiatric: He has a normal mood and affect. His behavior is normal.  Nursing note and vitals reviewed.  Previous notes and tests were reviewed. The plan was  reviewed with the patient/family, and all questions/concerned were addressed.  It was my pleasure to see Jamol today and participate in his care. Please feel free to contact me with any questions or concerns.  Sincerely,  Rexene Alberts, DO Allergy & Immunology  [facility] office: [phone] [facility] office: [place] office: [phone]

## 2019-06-05 NOTE — Telephone Encounter (Addendum)
Fax from [place]:  Dulera not covered Pt has tried:  Advair 01/2011, Flovent 01/2016, Qvar 07/2015 Symbicort:  Has never tried  Alternatives include:    These medications are more likely to be covered Using available formulary data, we have determined that covered formulary agents for your patients are likely:   PA Requirement  Fluticasone Propion-Salmeterol 250-50 Mcg/Dose Dsdv,  PA Not Required   Symbicort 160-4.5 Mcg/Actuation HFAA,  PA Not Required   Breo Ellipta 100-25 Mcg/Dose Dsdv,  PA Not Required   Advair HFA 115-21 Mcg/Actuation HFAA,  PA Not Required   Celedonio Miyamoto,  PA Not Required   Flovent HFA 110 Mcg/Actuation HFAA,  PA Not Required   Trelegy Ellipta,  PA Not Required   Iantha Fallen,  PA Not Required   Combivent Respimat,  PA Not Required   Budesonide-Formoterol 160-4.5 Mcg/Actuation HFAA,  PA Required   Qvar RediHaler 80 Mcg/Actuation HFAb,  PA Required   Mometasone Furoate (Bulk),  PA Required   Terms of service apply. Alternatives and PA Requirements listed above are based on third party available formulary data and may not apply to all plans. Check patient's specific plan formulary.   [employer] is reviewing your PA request. Typically an electronic response will be received within 72 hours. To check for an update later, open this request from your dashboard.

## 2019-06-06 NOTE — Telephone Encounter (Addendum)
Response from Cover my meds:  Jared [name] is already on the formulary. Call to pharmacy, per the pharmacist, [facility] is not what is on file. Explained to pharmacy that this is his insurance, pharmacy wanted the patient to call them and tell them himself.  Call to [place] to make him aware and he states that he will contact pharmacy and give them updated insurance information. Also, faxed over copy of the patient's insurance card to pharmacy.

## 2019-06-17 ENCOUNTER — Other Ambulatory Visit: Payer: Self-pay | Admitting: Podiatry

## 2019-06-19 ENCOUNTER — Other Ambulatory Visit: Payer: Self-pay | Admitting: Internal Medicine

## 2019-06-19 DIAGNOSIS — R42 Dizziness and giddiness: Secondary | ICD-10-CM

## 2019-06-19 DIAGNOSIS — G4489 Other headache syndrome: Secondary | ICD-10-CM

## 2019-06-19 NOTE — Telephone Encounter (Signed)
Only if he is still having symptoms. If so he probably has had liver function already done

## 2019-06-28 ENCOUNTER — Other Ambulatory Visit: Payer: Self-pay

## 2019-06-28 ENCOUNTER — Ambulatory Visit
Admission: RE | Admit: 2019-06-28 | Discharge: 2019-06-28 | Disposition: A | Discharge status: Home / Self Care | Payer: Medicare Other | Source: Ambulatory Visit | Attending: Internal Medicine | Admitting: Internal Medicine

## 2019-06-28 DIAGNOSIS — G4489 Other headache syndrome: Secondary | ICD-10-CM

## 2019-06-28 DIAGNOSIS — R42 Dizziness and giddiness: Secondary | ICD-10-CM

## 2019-06-28 MED ORDER — GADOBENATE DIMEGLUMINE 529 MG/ML IV SOLN
10.0000 mL | Freq: Once | INTRAVENOUS | Status: AC | PRN
  Administered 2019-06-28: 16:00:00 10 mL via INTRAVENOUS

## 2019-06-30 ENCOUNTER — Other Ambulatory Visit: Payer: Medicare Other

## 2019-07-03 ENCOUNTER — Ambulatory Visit: Payer: Medicare Other

## 2019-07-04 ENCOUNTER — Other Ambulatory Visit: Payer: Medicare Other

## 2019-07-12 ENCOUNTER — Other Ambulatory Visit: Payer: Self-pay | Admitting: Allergy and Immunology

## 2019-07-12 ENCOUNTER — Ambulatory Visit: Payer: Self-pay

## 2019-07-15 ENCOUNTER — Encounter: Payer: Self-pay | Admitting: Allergy

## 2019-07-15 ENCOUNTER — Ambulatory Visit: Payer: Medicare Other | Admitting: Allergy

## 2019-07-15 ENCOUNTER — Other Ambulatory Visit: Payer: Self-pay

## 2019-07-15 VITALS — BP 160/88 | HR 72 | Resp 16 | Ht 67.5 in

## 2019-07-15 DIAGNOSIS — J4550 Severe persistent asthma, uncomplicated: Secondary | ICD-10-CM

## 2019-07-15 DIAGNOSIS — K219 Gastro-esophageal reflux disease without esophagitis: Secondary | ICD-10-CM

## 2019-07-15 DIAGNOSIS — J302 Other seasonal allergic rhinitis: Secondary | ICD-10-CM

## 2019-07-15 DIAGNOSIS — J3089 Other allergic rhinitis: Secondary | ICD-10-CM

## 2019-07-15 MED ORDER — MONTELUKAST SODIUM 10 MG PO TABS: ORAL_TABLET | ORAL | 5 refills | Status: DC

## 2019-07-15 MED ORDER — SPIRIVA RESPIMAT 1.25 MCG/ACT IN AERS: 2.0000 | INHALATION_SPRAY | Freq: Every day | RESPIRATORY_TRACT | 5 refills | Status: DC

## 2019-07-15 MED ORDER — OMEPRAZOLE 20 MG PO CPDR: DELAYED_RELEASE_CAPSULE | ORAL | 5 refills | Status: DC

## 2019-07-15 NOTE — Patient Instructions (Addendum)
Severe persistent asthma  Today's spirometry was normal.   Daily controller medication(s): ? Continue Spiriva 1.31mcg 2 puffs daily. ? Continue Dulera 200 2 puffs twice a day with spacer and rinse mouth afterwards. ? Continue montelukast 10mg  daily. ? Continue Flovent 110 2 puffs twice a day with spacer and rinse mouth afterwards. ? Continue Xolair injections every 4 weeks.  Prior to physical activity:May use albuterol rescue inhaler 2 puffs 5 to 15 minutes prior to strenuous physical activities.  Rescue medications:May use albuterol rescue inhaler 2 puffs or nebulizer every 4 to 6 hours as needed for shortness of breath, chest tightness, coughing, and wheezing. Monitor frequency of use. Asthma control goals:  Full participation in all desired activities (may need albuterol before activity) Albuterol use two times or less a week on average (not counting use with activity) Cough interfering with sleep two times or less a month Oral steroids no more than once a year No hospitalizations  Seasonal and perennial allergic rhinitis Past history - 2004 testing positive to ragweed, trees, mold and dust mites.  Continue environmental control measures.   Continue Singulair 10mg  daily.  May use over the counter antihistamines such as Zyrtec (cetirizine), Claritin (loratadine), Allegra (fexofenadine), or Xyzal (levocetirizine) daily as needed.  LPRD (laryngopharyngeal reflux disease)  Continue Prilosec 20mg  daily.  May take it twice a day on the days the reflux flares up.   Continue reflux diet - avoid spicy foods, alcohol, tomato based products.  Follow up in 4 months or sooner if needed.

## 2019-07-15 NOTE — Progress Notes (Signed)
Follow Up Note  RE: Jared [name] MRN: [id number] DOB: Oct 12, 1950 Date of Office Visit: 07/15/2019  Referring provider: Burnard Bunting, MD Primary care provider: Burnard Bunting, MD  Chief Complaint: Asthma  History of Present Illness: I had the pleasure of seeing Jared [name] for a follow up visit at the [facility] on 07/15/2019. He is a 68 y.o. male, who is being followed for asthma, allergic rhinitis, LPRD. Today he is here for regular follow up visit. His previous allergy office visit was on 03/14/2019 with Dr. [name].   Severe persistent asthma Last asthma flare resolved within 3-5 days in June.   Denies any SOB, coughing, wheezing, chest tightness, nocturnal awakenings, ER/urgent care visits or prednisone use since the last visit.  No additional oral prednisone. He thinks the Spiriva is helping.  Takes Dulera, Singulair, Flovent and Xolair injections.  Seasonal and perennial allergic rhinitis No issues.  LPRD (laryngopharyngeal reflux disease) Ran out of medication and been taking Nexium which is not helping as much as omeprazole.   Assessment and Plan: Jared [name] is a 68 y.o. male with: Severe persistent asthma without complication Past history - last asthma flare in June 2020. Interim history - doing well with below regimen.  Today's spirometry was normal.  Act score of 25.  Daily controller medication(s): ? Continue Spiriva 1.27mcg 2 puffs daily. ? Continue Dulera 200 2 puffs twice a day with spacer and rinse mouth afterwards. ? Continue montelukast 10mg  daily. ? Continue Flovent 110 2 puffs twice a day with spacer and rinse mouth afterwards. ? Continue Xolair injections every 4 weeks.  Prior to physical activity:May use albuterol rescue inhaler 2 puffs 5 to 15 minutes prior to strenuous physical activities.  Rescue medications:May use albuterol rescue inhaler 2 puffs or nebulizer every 4 to 6 hours as needed for shortness of breath,  chest tightness, coughing, and wheezing. Monitor frequency of use.  Repeat spirometry at next visit and if doing well consider stepping down on some of his inhalers.  Seasonal and perennial allergic rhinitis Past history - 2004 testing positive to ragweed, trees, mold and dust mites. Interim history - well-controlled.  Continue environmental control measures.   Continue Singulair 10mg  daily.  May use over the counter antihistamines such as Zyrtec (cetirizine), Claritin (loratadine), Allegra (fexofenadine), or Xyzal (levocetirizine) daily as needed.  LPRD (laryngopharyngeal reflux disease) Ran out of Prilosec and has been using Nexium which is not working as well.  Continue Prilosec 20mg  daily.  May take it twice a day on the days the reflux flares up.   Continue reflux diet - avoid spicy foods, alcohol, tomato based products.  Return in about 4 months (around 11/15/2019).  Meds ordered this encounter  Medications  . Tiotropium Bromide Monohydrate (SPIRIVA RESPIMAT) 1.25 MCG/ACT AERS    Sig: Inhale 2 puffs into the lungs daily.    Dispense:  4 g    Refill:  5  . montelukast (SINGULAIR) 10 MG tablet    Sig: TAKE 1 TABLET(10 MG) BY MOUTH AT BEDTIME    Dispense:  30 tablet    Refill:  5  . omeprazole (PRILOSEC) 20 MG capsule    Sig: Take 1 tablet once a day and may take it twice a day during reflux flares.    Dispense:  60 capsule    Refill:  5   Diagnostics: Spirometry:  Tracings reviewed. His effort: Good reproducible efforts. FVC: 3.83L FEV1: 2.83L, 98% predicted FEV1/FVC ratio: 74% Interpretation: Spirometry consistent with  normal pattern.  Please see scanned spirometry results for details.  Medication List:  Current Outpatient Medications  Medication Sig Dispense Refill  . albuterol (VENTOLIN HFA) 108 (90 Base) MCG/ACT inhaler INHALE 2 PUFFS INTO THE LUNGS EVERY 4 HOURS AS NEEDED FOR WHEEZING 6.7 g 0  . Alirocumab (PRALUENT) 150 MG/ML SOAJ Inject 150 mg into the  skin every 14 (fourteen) days. 2 pen 6  . amLODipine (NORVASC) 10 MG tablet TK 1 T PO D    . ARIPiprazole (ABILIFY) 15 MG tablet Take 15 mg by mouth every morning.    . Armodafinil 250 MG tablet Take 250 mg by mouth daily.   0  . aspirin 81 MG tablet Take 1 tablet (81 mg total) by mouth daily. (Patient taking differently: Take 325 mg by mouth daily. )    . BELSOMRA 20 MG TABS Take 20 mg by mouth at bedtime.   0  . DULoxetine (CYMBALTA) 60 MG capsule Take 60 mg by mouth daily.    Marland Kitchen ezetimibe (ZETIA) 10 MG tablet Take 1 tablet (10 mg total) by mouth daily. NEED OV. (Patient taking differently: Take 10 mg by mouth daily. ) 1 tablet 0  . finasteride (PROSCAR) 5 MG tablet Take 5 mg by mouth daily.     . fish oil-omega-3 fatty acids 1000 MG capsule Take 1 g by mouth at bedtime.     . fluticasone (FLOVENT HFA) 110 MCG/ACT inhaler Inhale 2 puffs into the lungs 2 (two) times daily. 12 g 5  . gemfibrozil (LOPID) 600 MG tablet Take 600 mg by mouth 2 (two) times daily.    . hydrOXYzine (ATARAX/VISTARIL) 50 MG tablet Take 150 mg by mouth at bedtime.     Marland Kitchen ipratropium-albuterol (DUONEB) 0.5-2.5 (3) MG/3ML SOLN Take 3 mLs by nebulization every 6 (six) hours as needed (wheezing, coughing, shortness of breath). 75 mL 3  . isosorbide mononitrate (IMDUR) 30 MG 24 hr tablet take 1 tablet by mouth once daily (Patient taking differently: Take 30 mg by mouth at bedtime) 15 tablet 0  . levothyroxine (SYNTHROID, LEVOTHROID) 25 MCG tablet Take 25 mcg by mouth daily before breakfast.     . mometasone-formoterol (DULERA) 200-5 MCG/ACT AERO Inhale 2 puffs into the lungs 2 (two) times daily. 13 g 1  . montelukast (SINGULAIR) 10 MG tablet TAKE 1 TABLET(10 MG) BY MOUTH AT BEDTIME 30 tablet 5  . Multiple Vitamin (MULTIVITAMIN) tablet Take 1 tablet by mouth at bedtime.     . mupirocin cream (BACTROBAN) 2 % Apply 1 application topically 2 (two) times daily. 15 g 0  . nitroGLYCERIN (NITROSTAT) 0.4 MG SL tablet Place 1 tablet (0.4  mg total) under the tongue every 5 (five) minutes as needed for chest pain. 25 tablet 3  . omeprazole (PRILOSEC) 20 MG capsule TAKE 1 CAPSULE BY MOUTH TWICE A DAY AS DIRECTED (Patient taking differently: Take 20 mg by mouth at bedtime. ) 60 capsule 4  . REPATHA SURECLICK [id number] MG/ML SOAJ Inject 1 mL into the skin every 14 (fourteen) days.    . silodosin (RAPAFLO) 8 MG CAPS capsule Take 8 mg by mouth at bedtime.     . terbinafine (LAMISIL) 250 MG tablet Take 1 tablet (250 mg total) by mouth daily. 30 tablet 0  . Tiotropium Bromide Monohydrate (SPIRIVA RESPIMAT) 1.25 MCG/ACT AERS Inhale 2 puffs into the lungs daily. 4 g 5  . vitamin C (ASCORBIC ACID) 500 MG tablet Take 500 mg by mouth 2 (two) times daily.     Marland Kitchen  XOLAIR 150 MG injection INJECT 150MG  SUBCUTANEOUSLY EVERY 4 WEEKS (GIVEN AT  PRESCRIBERS OFFICE) 1 each 11  . omeprazole (PRILOSEC) 20 MG capsule Take 1 tablet once a day and may take it twice a day during reflux flares. 60 capsule 5   Current Facility-Administered Medications  Medication Dose Route Frequency Provider Last Rate Last Dose  . omalizumab [name]) injection 150 mg  150 mg Subcutaneous Q28 days Jiles Prows, MD   150 mg at 07/15/19 1615   Allergies: Allergies  Allergen Reactions  . 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 Penicillins Hives, Itching and Other (See Comments)    Has patient had a PCN reaction causing immediate rash, facial/tongue/throat swelling, SOB or lightheadedness with hypotension: Unknown Has patient had a PCN reaction causing severe rash involving mucus membranes or skin necrosis: Unknown Has patient had a PCN reaction that required hospitalization: Unknown Has patient had a PCN reaction occurring within the last 10 years: No If all of the above answers are "NO", then may proceed with  Cephalosporin use.   . Sulfonamide Derivatives Hives and Itching  . Codeine Nausea Only  . Statins Other (See Comments)    Myalgias   I reviewed his past medical history, social history, family history, and environmental history and no significant changes have been reported from his previous visit.  Review of Systems  Constitutional: Negative for appetite change, chills, fever and unexpected weight change.  HENT: Negative for congestion and rhinorrhea.   Eyes: Negative for itching.  Respiratory: Negative for cough, chest tightness, shortness of breath and wheezing.   Cardiovascular: Negative for chest pain.  Gastrointestinal: Negative for abdominal pain.  Skin: Negative for rash.  Allergic/Immunologic: Positive for environmental allergies.  Neurological: Negative for dizziness and headaches.   Objective: BP (!) 160/88 (BP Location: Right Arm, Patient Position: Sitting, Cuff Size: Normal)   Pulse 72   Resp 16   Ht 5' 7.5" (1.715 m)   SpO2 97%   BMI 32.37 kg/m  Body mass index is 32.37 kg/m. Physical Exam  Constitutional: He is oriented to person, place, and time. He appears well-developed and well-nourished.  HENT:  Head: Normocephalic and atraumatic.  Right Ear: External ear normal.  Left Ear: External ear normal.  Nose: Nose normal.  Mouth/Throat: Oropharynx is clear and moist.  Eyes: Conjunctivae and EOM are normal.  Neck: Neck supple.  Cardiovascular: Normal rate, regular rhythm and normal heart sounds. Exam reveals no gallop and no friction rub.  No murmur heard. Pulmonary/Chest: Effort normal and breath sounds normal. He has no wheezes. He has no rales.  Neurological: He is alert and oriented to person, place, and time.  Skin: Skin is warm. No rash noted.  Psychiatric: He has a normal mood and affect. His behavior is normal.  Nursing note and vitals reviewed.  Previous notes and tests were reviewed. The plan was reviewed with the patient/family, and all  questions/concerned were addressed.  It was my pleasure to see Jared [name] today and participate in his care. Please feel free to contact me with any questions or concerns.  Sincerely,  Rexene Alberts, DO Allergy & Immunology  [facility] office: [phone] [facility] office: [facility] office: [phone]

## 2019-07-15 NOTE — Assessment & Plan Note (Addendum)
Ran out of Prilosec and has been using Nexium which is not working as well.  Continue Prilosec 20mg  daily.  May take it twice a day on the days the reflux flares up.   Continue reflux diet - avoid spicy foods, alcohol, tomato based products.

## 2019-07-15 NOTE — Assessment & Plan Note (Signed)
Past history - 2004 testing positive to ragweed, trees, mold and dust mites. Interim history - well-controlled.  Continue environmental control measures.   Continue Singulair 10mg  daily.  May use over the counter antihistamines such as Zyrtec (cetirizine), Claritin (loratadine), Allegra (fexofenadine), or Xyzal (levocetirizine) daily as needed.

## 2019-07-15 NOTE — Assessment & Plan Note (Addendum)
Past history - last asthma flare in June 2020. Interim history - doing well with below regimen.  Today's spirometry was normal.  Act score of 25.  Daily controller medication(s): ? Continue Spiriva 1.87mcg 2 puffs daily. ? Continue Dulera 200 2 puffs twice a day with spacer and rinse mouth afterwards. ? Continue montelukast 10mg  daily. ? Continue Flovent 110 2 puffs twice a day with spacer and rinse mouth afterwards. ? Continue Xolair injections every 4 weeks.  Prior to physical activity:May use albuterol rescue inhaler 2 puffs 5 to 15 minutes prior to strenuous physical activities.  Rescue medications:May use albuterol rescue inhaler 2 puffs or nebulizer every 4 to 6 hours as needed for shortness of breath, chest tightness, coughing, and wheezing. Monitor frequency of use.  Repeat spirometry at next visit and if doing well consider stepping down on some of his inhalers.

## 2019-07-16 ENCOUNTER — Telehealth: Payer: Self-pay | Admitting: Neurology

## 2019-07-16 ENCOUNTER — Encounter: Payer: Self-pay | Admitting: Neurology

## 2019-07-16 ENCOUNTER — Ambulatory Visit: Payer: Medicare Other | Admitting: Neurology

## 2019-07-16 DIAGNOSIS — G2111 Neuroleptic induced parkinsonism: Secondary | ICD-10-CM | POA: Diagnosis not present

## 2019-07-16 DIAGNOSIS — G20C Parkinsonism, unspecified: Secondary | ICD-10-CM | POA: Insufficient documentation

## 2019-07-16 DIAGNOSIS — G4489 Other headache syndrome: Secondary | ICD-10-CM | POA: Diagnosis not present

## 2019-07-16 DIAGNOSIS — G219 Secondary parkinsonism, unspecified: Secondary | ICD-10-CM

## 2019-07-16 DIAGNOSIS — G20 Parkinson's disease: Secondary | ICD-10-CM | POA: Insufficient documentation

## 2019-07-16 HISTORY — DX: Secondary parkinsonism, unspecified: G21.9

## 2019-07-16 MED ORDER — DULOXETINE HCL 60 MG PO CPEP: 60.0000 mg | ORAL_CAPSULE | Freq: Two times a day (BID) | ORAL | 2 refills | Status: DC

## 2019-07-16 NOTE — Progress Notes (Signed)
Reason for visit: Headache, new onset  Referring physician: Dr. Huston Foley is a 68 y.o. male  History of present illness:  Jared [name] is a 69 year old right-handed white male with a history of renal cell carcinoma in the past, history of coronary artery disease, asthma, sleep apnea on CPAP, and history of depression on medications to include Abilify.  The patient claims that in early August he began having headaches in the right frontotemporal region.  He has never had any problems with headaches at any time in his life, headaches are very unusual for him.  The patient has noted that the headaches may last 30 minutes to an hour and a half, and may come and go up to 3 times a day, sometimes waking him up out of sleep.  The patient has no nausea, vomiting or photophobia or phonophobia with the headache.  He denies any vision changes or neck pain.  He may take Tylenol with good improvement of the headache.  The headaches are usually a 5 or 6 out of 10 on a pain scale and are described as a dull achy pain.  The patient reports no sinus drainage or allergy symptoms.  He denies any numbness or weakness of the face, arms, legs.  He has noted recently some problems with tremor that will come and go involving the left upper extremity.  He denies issues controlling the bowels or the bladder.  He was sent to this office for an evaluation.  He recently had MRI of the brain that was relatively unremarkable, no source of the headache was seen.  The headaches are always on the right frontal temporal area, never anywhere else.  He does not get any redness of the eye or tearing of the eye with a headache.  The patient was placed on Lamisil around the time of onset of headache, but he ran out of his medication a week ago and he has continued to have headaches.  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   Peripheral neuropathy    Renal cell carcinoma 1997   Left kidney   Shoulder pain, right    Sleep apnea    wears CPAP   Sleep talking    Snoring 12/25/2013   Wears glasses     Past Surgical History:  Procedure Laterality Date   ANTERIOR CERVICAL DECOMP/DISCECTOMY FUSION N/A 08/27/2014   Procedure: Cervical six-seven anterior cervical decompression fusion with removal of hardware at Cervical five-six;  Surgeon: Erline Levine, MD;  Location: [facility];  Service: Neurosurgery;  Laterality: N/A;  Cervical six-seven anterior cervical decompression fusion with removal of hardware at Cervical five-six   [place], 2013   CARDIAC CATHETERIZATION  05/10/2007   Minimal CAD, normal LV systolic function, medical management   CARDIAC CATHETERIZATION  04/08/2008   RCA ulcerated 70-80% stenosis, stented with a 3x49mm Endeavor stent at 13atm for 50sec, reduced from 80% ulcerated stenosis to 0%.   CARDIAC CATHETERIZATION  05/07/2009  50% distal left main disease-IVUS or flow wire too dangerous in particular setting to perform intervention.   CARDIAC CATHETERIZATION  03/18/2010   Medical management   CARDIOVASCULAR STRESS TEST  02/09/2012   Normal, no significant wall abnormalities noted   CARPAL TUNNEL RELEASE Bilateral    COLONOSCOPY W/ BIOPSIES AND POLYPECTOMY     CORONARY STENT PLACEMENT     FEMUR FRACTURE SURGERY Left 1995   HERNIA REPAIR     NECK SURGERY  2005   NEPHRECTOMY Left    secondary to cancer   TOTAL KNEE ARTHROPLASTY Left 2005   TOTAL KNEE ARTHROPLASTY Right 04/13/2018   Procedure: RIGHT TOTAL KNEE  ARTHROPLASTY;  Surgeon: Netta Cedars, MD;  Location: [facility];  Service: Orthopedics;  Laterality: Right;    Family History  Problem Relation Age of Onset   Asthma Mother    Heart disease Mother    Hypertension Mother    Esophageal cancer Father    Barrett's esophagus Father    Bone cancer Father        mets from esophagus   Heart disease Father    Heart disease Paternal Grandfather    Heart attack Paternal Grandfather 28   Colon cancer Maternal Uncle    Diabetes Sister    Cancer Sister        Cervical cancer   Stomach cancer Neg Hx     Social history:  reports that he has never smoked. He has never used smokeless tobacco. He reports previous alcohol use. He reports that he does not use drugs.  Medications:  Prior to Admission medications   Medication Sig Start Date End Date Taking? Authorizing Provider  albuterol (VENTOLIN HFA) 108 (90 Base) MCG/ACT inhaler INHALE 2 PUFFS INTO THE LUNGS EVERY 4 HOURS AS NEEDED FOR WHEEZING 04/09/19  Yes Kozlow, Donnamarie Poag, MD  Alirocumab (PRALUENT) 150 MG/ML SOAJ Inject 150 mg into the skin every 14 (fourteen) days. 06/04/19  Yes Lorretta Harp, MD  amLODipine (NORVASC) 10 MG tablet TK 1 T PO D 06/06/19  Yes [provider]  ARIPiprazole (ABILIFY) 15 MG tablet Take 15 mg by mouth every morning. 09/04/18  Yes [provider]  Armodafinil 250 MG tablet Take 250 mg by mouth daily.  01/22/17  Yes [provider]  aspirin 81 MG tablet Take 1 tablet (81 mg total) by mouth daily. Patient taking differently: Take 325 mg by mouth daily.  04/09/18  Yes Lendon Colonel, NP  BELSOMRA 20 MG TABS Take 20 mg by mouth at bedtime.  01/26/17  Yes [provider]  DULoxetine (CYMBALTA) 60 MG capsule Take 60 mg by mouth daily.   Yes [provider]  ezetimibe (ZETIA) 10 MG tablet Take 1 tablet (10 mg total) by mouth daily. NEED OV. Patient taking differently: Take 10 mg by mouth daily.  07/03/17  Yes Lorretta Harp, MD  finasteride (PROSCAR) 5 MG tablet Take 5 mg by mouth daily.    Yes [provider]  fish oil-omega-3 fatty acids 1000 MG capsule Take 1 g by mouth at bedtime.    Yes [provider]  fluticasone (FLOVENT HFA) 110 MCG/ACT inhaler Inhale 2 puffs into the lungs 2 (two) times daily. 03/14/19  Yes Garnet Sierras, DO  gemfibrozil (LOPID) 600 MG tablet Take 600 mg by mouth 2 (two) times daily.   Yes [provider]  hydrOXYzine (ATARAX/VISTARIL) 50 MG tablet Take 150 mg by mouth at bedtime.    Yes [provider]  ipratropium-albuterol (DUONEB) 0.5-2.5 (3) MG/3ML SOLN Take 3 mLs by nebulization every 6 (six) hours as needed (wheezing, coughing, shortness of breath). 03/14/19  Yes Garnet Sierras, DO  isosorbide mononitrate (IMDUR) 30 MG 24 hr tablet take 1 tablet by mouth once daily Patient taking differently: Take 30 mg by mouth at bedtime 06/21/17  Yes Lorretta Harp, MD  levothyroxine (SYNTHROID, LEVOTHROID) 25 MCG tablet Take 25 mcg by mouth daily before breakfast.    Yes [provider]  mometasone-formoterol (DULERA) 200-5 MCG/ACT AERO Inhale 2 puffs into the lungs 2 (two) times daily. 03/14/19  Yes Garnet Sierras, DO  montelukast (SINGULAIR) 10 MG tablet TAKE 1 TABLET(10 MG) BY MOUTH AT BEDTIME 07/15/19  Yes Garnet Sierras, DO  Multiple Vitamin (MULTIVITAMIN) tablet Take 1 tablet by mouth at bedtime.    Yes [provider]  mupirocin cream (BACTROBAN) 2 % Apply 1 application topically 2 (two) times daily. 05/06/19  Yes Regal, Tamala Fothergill, DPM  nitroGLYCERIN (NITROSTAT) 0.4 MG SL tablet Place 1 tablet (0.4 mg total) under the tongue every 5 (five) minutes as needed for chest pain. 07/21/15  Yes Lorretta Harp, MD  omeprazole (PRILOSEC) 20 MG capsule TAKE 1 CAPSULE BY MOUTH TWICE A DAY AS DIRECTED Patient taking differently: Take 20 mg by mouth at bedtime.  01/29/18  Yes Kozlow, Donnamarie Poag, MD  omeprazole (PRILOSEC) 20 MG capsule Take 1 tablet once a  day and may take it twice a day during reflux flares. 07/15/19  Yes Garnet Sierras, DO  REPATHA SURECLICK [id number] MG/ML SOAJ Inject 1 mL into the skin every 14 (fourteen) days. 06/07/19  Yes [provider]  silodosin (RAPAFLO) 8 MG CAPS capsule Take 8 mg by mouth at bedtime.    Yes [provider]  terbinafine (LAMISIL) 250 MG tablet Take 1 tablet (250 mg total) by mouth daily. 05/06/19  Yes Regal, Tamala Fothergill, DPM  Tiotropium Bromide Monohydrate (SPIRIVA RESPIMAT) 1.25 MCG/ACT AERS Inhale 2 puffs into the lungs daily. 07/15/19  Yes Garnet Sierras, DO  vitamin C (ASCORBIC ACID) 500 MG tablet Take 500 mg by mouth 2 (two) times daily.    Yes [provider]  [name] 150 MG injection INJECT 150MG  SUBCUTANEOUSLY EVERY 4 WEEKS (GIVEN AT  PRESCRIBERS OFFICE) 07/12/19  Yes Kozlow, Donnamarie Poag, MD      Allergies  Allergen Reactions   Contrast Media [Iodinated Diagnostic Agents] Other (See Comments)    Was told not to take d/t pt only having 1 kidney   Nsaids Other (See Comments)    Was told not to take d/t pt only having 1 kidney   Penicillins Hives, Itching and Other (See Comments)    Has patient had a PCN reaction causing immediate rash, facial/tongue/throat swelling, SOB or lightheadedness with hypotension: Unknown Has patient had a PCN reaction causing severe rash involving mucus membranes or skin necrosis: Unknown Has patient had a PCN reaction that required hospitalization: Unknown Has patient had a PCN reaction occurring within the last 10 years: No If all of the above answers are "NO", then may proceed with Cephalosporin use.    Sulfonamide Derivatives Hives and Itching   Codeine Nausea Only   Statins Other (See Comments)    Myalgias    ROS:  Out of a complete 14 system review of symptoms, the patient complains only of the following symptoms, and all other reviewed systems are negative.  Headache Tremor Depression  Blood pressure (!) 152/87, pulse 86, temperature (!)  97.5 F (36.4 C), temperature source Temporal, height 5' 7.5" (1.715 m), weight 205 lb 5 oz (93.1 kg).  Physical Exam  General: The patient is alert and cooperative at the time of the examination.  The patient is moderately obese.  Eyes: Pupils are equal, round, and reactive to light. Discs are flat bilaterally.  Neck: The neck is supple, no carotid bruits are noted.  Respiratory: The respiratory examination is clear.  Cardiovascular: The cardiovascular examination reveals a regular rate and rhythm, no obvious murmurs or rubs are noted.  Neuromuscular: The patient lacks about 10 degrees of full lateral rotation of the cervical spine bilaterally.  No crepitus is noted in the temporomandibular joints on either side.  He has no tenderness with palpation of the temporal areas, the temporal artery pulses are symmetric on both sides.  Skin: Extremities are without significant edema.  Neurologic Exam  Mental status: The patient is alert and oriented x 3 at the time of the examination. The patient has apparent normal recent and remote memory, with an apparently normal attention span and concentration ability.  Cranial nerves: Facial symmetry is present. There is good sensation of the face to pinprick and soft touch bilaterally. The strength of the facial muscles and the muscles to head turning and shoulder shrug are normal bilaterally. Speech is well enunciated, no aphasia or dysarthria is noted. Extraocular movements are full. Visual fields are full. The tongue is midline, and the patient has symmetric elevation of the soft palate. No obvious hearing deficits are noted.  Mild masking of the face is seen.  Motor: The motor testing reveals 5 over 5 strength of all 4 extremities. Good symmetric motor tone is noted throughout.  Sensory: Sensory testing is intact to pinprick, soft touch, vibration sensation, and position sense on all 4 extremities. No evidence of extinction is noted.  Coordination:  Cerebellar testing reveals good finger-nose-finger and heel-to-shin bilaterally.  A resting tremors noted with the left upper extremity at times, particular with walking.  Gait and station: Gait is unremarkable, the patient does have some decreased arm swing bilaterally, tremor seen with the left upper extremity with walking. Tandem gait is normal. Romberg is negative. No drift is seen.  Reflexes: Deep tendon reflexes are symmetric, but are depressed bilaterally. Toes are downgoing bilaterally.   MRI brain 06/28/19:  IMPRESSION: No abnormality seen to explain the clinical presentation. Essentially normal study for age. Mild age related volume loss. Few punctate white matter foci, not likely of significance.  * MRI scan images were reviewed online. I agree with the written report.    Assessment/Plan:  1.  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  The headaches are not generalized, and are always in the right frontotemporal area.  For this reason, I will go back and look at MRA of the head to exclude a vascular abnormality that may be the source of the headache.  The patient will be sent for blood work today to include a sedimentation rate and C-reactive protein.  He will be increased on the Cymbalta taking 60 mg twice daily, if this is not effective, we may add gabapentin or Lyrica in the future.  He will follow-up otherwise in 3 months.  Jill Alexanders MD 07/16/2019 11:21 AM  [facility] [address] [place], [place] [address]  Phone [phone] Fax [phone]

## 2019-07-16 NOTE — Patient Instructions (Signed)
We will go up on the Cymbalta to 60 mg twice a day.

## 2019-07-16 NOTE — Telephone Encounter (Signed)
[employer] medicare order sent to GI. No auth they will reach out to the patient to schedule.  

## 2019-07-17 ENCOUNTER — Ambulatory Visit: Payer: Self-pay

## 2019-07-17 ENCOUNTER — Telehealth: Payer: Self-pay | Admitting: Neurology

## 2019-07-17 LAB — C-REACTIVE PROTEIN: CRP: 53 mg/L — ABNORMAL HIGH (ref 0–10)

## 2019-07-17 LAB — SEDIMENTATION RATE: Sed Rate: 8 mm/hr (ref 0–30)

## 2019-07-17 MED ORDER — PREDNISONE 5 MG PO TABS: ORAL_TABLET | ORAL | 0 refills | Status: DC

## 2019-07-17 NOTE — Telephone Encounter (Signed)
I called the patient.  The sedimentation rate was completely normal at 8, but the C-reactive protein was elevated.  The patient does have asthma, this could be a source of the elevation.  Given the new onset headaches, I will give a short course of prednisone to see if this resolves in an immediate benefit for the headache that might suggest temporal arteritis.  The patient will be sent for MRI of the head.

## 2019-07-26 ENCOUNTER — Ambulatory Visit: Payer: Medicare Other | Admitting: Podiatry

## 2019-07-31 DIAGNOSIS — I129 Hypertensive chronic kidney disease with stage 1 through stage 4 chronic kidney disease, or unspecified chronic kidney disease: Secondary | ICD-10-CM | POA: Insufficient documentation

## 2019-08-06 ENCOUNTER — Other Ambulatory Visit: Payer: Self-pay

## 2019-08-06 ENCOUNTER — Ambulatory Visit
Admission: RE | Admit: 2019-08-06 | Discharge: 2019-08-06 | Disposition: A | Discharge status: Home / Self Care | Payer: Medicare Other | Source: Ambulatory Visit | Attending: Neurology | Admitting: Neurology

## 2019-08-06 DIAGNOSIS — G4489 Other headache syndrome: Secondary | ICD-10-CM

## 2019-08-10 ENCOUNTER — Telehealth: Payer: Self-pay | Admitting: Neurology

## 2019-08-10 MED ORDER — GABAPENTIN 100 MG PO CAPS: 100.0000 mg | ORAL_CAPSULE | Freq: Three times a day (TID) | ORAL | 3 refills | Status: DC

## 2019-08-10 NOTE — Telephone Encounter (Signed)
I called the patient.  Jared [name] a of the head was unremarkable exception of a moderate stenosis of the right P2 segment, did not believe this has anything to his headaches.  He is still having daily headaches, the increase in Cymbalta offered no benefit.  We will add gabapentin in low-dose, he will call for any dose adjustments.   MRA head 08/08/19:   IMPRESSION:   MRA head (without) demonstrating: - Right posterior cerebral artery has focal narrowing with moderate stenosis (P2 segment).  - Remainder of medium to large sized vessels are unremarkable.

## 2019-08-12 ENCOUNTER — Ambulatory Visit (INDEPENDENT_AMBULATORY_CARE_PROVIDER_SITE_OTHER): Payer: Medicare Other

## 2019-08-12 ENCOUNTER — Other Ambulatory Visit: Payer: Self-pay

## 2019-08-12 DIAGNOSIS — J4540 Moderate persistent asthma, uncomplicated: Secondary | ICD-10-CM | POA: Diagnosis not present

## 2019-08-27 ENCOUNTER — Encounter: Payer: Self-pay | Admitting: Podiatry

## 2019-08-27 ENCOUNTER — Other Ambulatory Visit: Payer: Self-pay

## 2019-08-27 ENCOUNTER — Ambulatory Visit: Payer: Medicare Other | Admitting: Podiatry

## 2019-08-27 ENCOUNTER — Encounter: Payer: Self-pay | Admitting: Neurology

## 2019-08-27 DIAGNOSIS — M79675 Pain in left toe(s): Secondary | ICD-10-CM | POA: Diagnosis not present

## 2019-08-27 DIAGNOSIS — B351 Tinea unguium: Secondary | ICD-10-CM

## 2019-08-27 DIAGNOSIS — M79674 Pain in right toe(s): Secondary | ICD-10-CM

## 2019-08-27 NOTE — Progress Notes (Signed)
Complaint:  Visit Type: Patient returns to my office for continued preventative foot care services. Complaint: Patient states" my nails have grown long and thick and become painful to walk and wear shoes" Patient has been diagnosed with neuropathy.. The patient presents for preventative foot care services.  Podiatric Exam: Vascular: dorsalis pedis and posterior tibial pulses are palpable bilateral. Capillary return is immediate. Temperature gradient is WNL. Skin turgor WNL  Sensorium: Diminished  Semmes Weinstein monofilament test. Diminished  tactile sensation bilaterally. Nail Exam: Pt has thick disfigured discolored nails with subungual debris noted bilateral entire nail hallux through fifth toenails Ulcer Exam: There is no evidence of ulcer or pre-ulcerative changes or infection. Orthopedic Exam: Muscle tone and strength are WNL. No limitations in general ROM. No crepitus or effusions noted. Foot type and digits show no abnormalities. Bony prominences are unremarkable. Skin: No Porokeratosis. No infection or ulcers  Diagnosis:  Onychomycosis, , Pain in right toe, pain in left toes  Treatment & Plan Procedures and Treatment: Consent by patient was obtained for treatment procedures.   Debridement of mycotic and hypertrophic toenails, 1 through 5 bilateral and clearing of subungual debris. No ulceration, no infection noted.  Return Visit-Office Procedure: Patient instructed to return to the office for a follow up visit 3 months for continued evaluation and treatment.    Gardiner Barefoot DPM

## 2019-08-28 ENCOUNTER — Encounter: Payer: Self-pay | Admitting: Neurology

## 2019-08-28 ENCOUNTER — Ambulatory Visit: Payer: Medicare Other | Admitting: Neurology

## 2019-08-28 VITALS — BP 132/82 | HR 66 | Temp 96.8°F | Ht 67.5 in | Wt 203.0 lb

## 2019-08-28 DIAGNOSIS — G2111 Neuroleptic induced parkinsonism: Secondary | ICD-10-CM | POA: Diagnosis not present

## 2019-08-28 DIAGNOSIS — G4750 Parasomnia, unspecified: Secondary | ICD-10-CM | POA: Diagnosis not present

## 2019-08-28 DIAGNOSIS — G478 Other sleep disorders: Secondary | ICD-10-CM

## 2019-08-28 DIAGNOSIS — Z9989 Dependence on other enabling machines and devices: Secondary | ICD-10-CM

## 2019-08-28 DIAGNOSIS — G4733 Obstructive sleep apnea (adult) (pediatric): Secondary | ICD-10-CM | POA: Insufficient documentation

## 2019-08-28 DIAGNOSIS — I2510 Atherosclerotic heart disease of native coronary artery without angina pectoris: Secondary | ICD-10-CM | POA: Diagnosis not present

## 2019-08-28 DIAGNOSIS — F5105 Insomnia due to other mental disorder: Secondary | ICD-10-CM

## 2019-08-28 DIAGNOSIS — J4540 Moderate persistent asthma, uncomplicated: Secondary | ICD-10-CM | POA: Diagnosis not present

## 2019-08-28 MED ORDER — TRAZODONE HCL 50 MG PO TABS: 50.0000 mg | ORAL_TABLET | Freq: Every evening | ORAL | 5 refills | Status: DC | PRN

## 2019-08-28 NOTE — Progress Notes (Signed)
SLEEP MEDICINE CLINIC    Provider:  Larey Seat, MD  Primary Care Physician:  Burnard Bunting, MD [place] [address]     Referring Provider: Burnard Bunting, [address] [place],  [place] [address]          Chief Complaint according to patient   Patient presents with:     New Patient (Initial Visit)           HISTORY OF PRESENT ILLNESS:  Jared [name] is a 68 y.o. year old White or Caucasian male patient seen for a sleep consultation- the patient's primary neurologist is Dr Jannifer Franklin.  referral on 08/28/2019 from PCP  for a new evaluation.   Chief concern according to patient :  I am getting more and more fatigued, CPAP machine is 70 years old.    I have the pleasure of seeing Jared [name] today, a right-handed White or Caucasian male with a possible sleep disorder.  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  Patient reports headaches have improved, sleepiness is progressing.  Dr Jannifer Franklin started w treatment of severe depression with Cymbalta. Once a day and he sleeps better when taking Hydralizine and Belsomra with Metta Clines.    Jared [name] was seen for a baseline diagnostic polysomnography on January 14, 2014 referred by Dr. Burnard Bunting and at the time less sleepy than today with the Epworth Sleepiness Scale endorsed at 9 out of 24 points.  Sleep efficiency was poor 70.6% and highly  fragmented.  The patient had multiple hypopneas rather than apneas 66 respiratory events consisted of 55 shallow breathing spells 1 obstructive apnea and 4 central apneas and 6 mixed apneas with an AHI of 12.9.  The lowest oxygen saturation was 77% at nadir with 41 minutes of desaturation in total.  He also had frequent periodic limb movements at the time he also moved during REM sleep indicative of REM sleep behavior disorder.  He returned for CPAP titration on 06 Feb 2014 his AHI was reduced significantly and his periodic limb movements also improved.  Nadir was now 82% with only 2 minutes of desaturation time.  He started on 10 cmH2O pressure with 3 cm flex or air sense and a nasal pillow.  Normal sinus rhythm by EKG was no injury was noted.  Dr. Jannifer Franklin:  08-10-2019 History of present illness:  Jared [name] is a 68 year old right-handed white male with a history of renal cell carcinoma in the past, history of coronary artery disease, asthma, sleep apnea on CPAP, and history of depression on medications to include Abilify.  The patient claims that in early August he began having headaches in the right frontotemporal region.  He has never had any problems with headaches at any time in his life, headaches are very unusual for him.  The patient has noted that the headaches may last 30 minutes to an hour and a half, and may come and go up to 3 times a day, sometimes waking him up out of sleep.  The patient has no nausea, vomiting or photophobia or phonophobia with the headache.  He denies any vision changes or neck pain.  He may take Tylenol with good improvement of the headache.  The headaches are usually a 5 or 6 out of 10 on a pain scale and are described as a dull achy pain.  The patient reports no sinus drainage or allergy symptoms.  He denies any numbness or weakness of the face, arms, legs.  He has noted recently some problems with tremor that will come and go involving the left upper extremity.  He  denies issues controlling the bowels or the bladder.  He was sent to this office for an evaluation.  He recently had MRI of the brain that was relatively unremarkable, no source of the headache was seen.  The headaches are always on the right frontal temporal area, never anywhere else.  He does not get any redness of the eye or tearing of the eye with a headache.  The patient was placed on Lamisil around the time of onset of headache, but he ran out of his medication a week ago and he has continued to have headaches.  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   Peripheral neuropathy    Renal cell carcinoma 1997   Left kidney   Shoulder pain, right    Sleep apnea    wears CPAP   Sleep talking    Snoring 12/25/2013   Wears glasses     Past Surgical History:  Procedure Laterality Date   ANTERIOR CERVICAL DECOMP/DISCECTOMY FUSION N/A 08/27/2014   Procedure: Cervical six-seven anterior cervical decompression fusion with removal of hardware at Cervical five-six;  Surgeon: Erline Levine, MD;  Location: [facility];  Service: Neurosurgery;  Laterality: N/A;  Cervical six-seven anterior cervical decompression fusion with removal of hardware at Cervical five-six   [place], 2013   CARDIAC CATHETERIZATION  05/10/2007   Minimal CAD, normal LV systolic function, medical management   CARDIAC CATHETERIZATION  04/08/2008   RCA ulcerated 70-80% stenosis, stented with a 3x81mm Endeavor stent at 13atm for 50sec, reduced from 80% ulcerated stenosis  to 0%.   CARDIAC CATHETERIZATION  05/07/2009   50% distal left main disease-IVUS or flow wire too dangerous in particular setting to perform intervention.   CARDIAC CATHETERIZATION  03/18/2010   Medical management   CARDIOVASCULAR STRESS TEST  02/09/2012   Normal, no significant wall abnormalities noted   CARPAL TUNNEL RELEASE Bilateral    COLONOSCOPY W/ BIOPSIES AND POLYPECTOMY     CORONARY STENT PLACEMENT     FEMUR FRACTURE SURGERY Left 1995   HERNIA REPAIR     NECK SURGERY  2005   NEPHRECTOMY Left    secondary to cancer   TOTAL KNEE ARTHROPLASTY Left 2005   TOTAL KNEE ARTHROPLASTY Right 04/13/2018   Procedure: RIGHT TOTAL KNEE ARTHROPLASTY;  Surgeon: Netta Cedars, MD;  Location: [facility];  Service: Orthopedics;  Laterality: Right;    Family History  Problem Relation Age of Onset   Asthma Mother    Heart disease Mother  Hypertension Mother    Esophageal cancer Father    Barrett's esophagus Father    Bone cancer Father        mets from esophagus   Heart disease Father    Heart disease Paternal Grandfather    Heart attack Paternal Grandfather 63   Colon cancer Maternal Uncle    Diabetes Sister    Cancer Sister        Cervical cancer   Stomach cancer Neg Hx        The patient had the first sleep study in the year 2014 at [facility] with me.    CPAP download:  The patient's compliance report shows that he used his CPAP 24 out of 30 days equivalent to 80% he is [facility] this 30-day recording low back on the days before-08-27-2019. He takes Belsomra and hydralazine at night most nights and this usually allows him to sleep more than 4 hours.  However there have been several days for he just did not get enough sleep to make the 4-hour mark.  His CPAP is not an autotitrator it is set at 10 cm water pressure with 3 cm EPR and he has a very good residual apnea index of only 2.4 events per hour of sleep he does have  moderate air leakage.    Family medical /sleep history: No other family member on OSA, insomnia..    Social history: Patient is retired from Development worker, international aid and lives in a household with 2 persons. Family status is married , with adult son children,3 grandchildren.  Pets are not present. Tobacco use; none .  ETOH use; quit in 2012 - because of health , Caffeine intake in form of Coffee( 1 cup) Soda( no) Tea ( 1 glass) or energy drinks. Regular exercise in form of gardening.   Hobbies :gardening.   Sleep habits are as follows: The patient's dinner time is between 6-7 PM. The patient goes to bed at 8.30-9.30 PM and it may take 2-3 hours to go to sleep. The bedroom in cool, quiet and dark. He continues to sleep for 3-5 hours, wakes up may be once for bathroom break, The preferred sleep position is sideways, with the support of 1 pillow, adjustable bed is kept flat.  Dreams are reportedly frequent/vividly - often related to childhood.  9 AM is the usual rise time.  The patient wakes up with an alarm at 8-9 Am but while depressed stayed in bed until 12. Marland Kitchen  Hereports not feeling refreshed or restored in AM, with symptoms such as dry mouth,  Frequent morning headaches , and residual fatigue.  Naps are taken frequently, lasting from 30 to 180 minutes and are even less refreshing than nocturnal sleep.    Review of Systems: Out of a complete 14 system review, the patient complains of only the following symptoms, and all other reviewed systems are negative.:  Fatigue, sleepiness , snoring, fragmented sleep, non-restorative sleep.  His nasal pillows slip off a lot, air leak.    How likely are you to doze in the following situations: 0 = not likely, 1 = slight chance, 2 = moderate chance, 3 = high chance   Sitting and Reading? Watching Television? Sitting inactive in a public place (theater or meeting)? As a passenger in a car for an hour without a break? Lying down in the afternoon when circumstances  permit? Sitting and talking to someone? Sitting quietly after lunch without alcohol? In a car, while stopped  for a few minutes in traffic?   Total = 11/ 24 points   FSS endorsed at 56/ 63 points.   Social History   Socioeconomic History   Marital status: Married    Spouse name: [name]   Number of children: 1   Years of education: 14   Highest education level: Not on file  Occupational History   Occupation: OWNER. Landscape Design/horticulture    Employer: [employer]   Occupation: Information systems manager: [employer]  Social Designer, fashion/clothing strain: Not on file   Food insecurity    Worry: Not on file    Inability: Not on file   Transportation needs    Medical: Not on file    Non-medical: Not on file  Tobacco Use   Smoking status: Never Smoker   Smokeless tobacco: Never Used  Substance and Sexual Activity   Alcohol use: Not Currently   Drug use: No   Sexual activity: Not on file  Lifestyle   Physical activity    Days per week: Not on file    Minutes per session: Not on file   Stress: Not on file  Relationships   Social connections    Talks on phone: Not on file    Gets together: Not on file    Attends religious service: Not on file    Active member of club or organization: Not on file    Attends meetings of clubs or organizations: Not on file    Relationship status: Not on file  Other Topics Concern   Not on file  Social History Narrative   Patient is married [name]).   Patient drinks one cup of coffee but not everyday.   Patient has one child.   Patient has a college education.   Patient is right-handed.    Family History  Problem Relation Age of Onset   Asthma Mother    Heart disease Mother    Hypertension Mother    Esophageal cancer Father    Barrett's esophagus Father    Bone cancer Father        mets from esophagus   Heart disease Father    Heart disease Paternal Grandfather    Heart attack Paternal  Grandfather 80   Colon cancer Maternal Uncle    Diabetes Sister    Cancer Sister        Cervical cancer   Stomach cancer Neg Hx     Past Medical History:  Diagnosis Date   Cervical vertebral fusion 05/02/2014   Now presenting with C5-6 radiculopathy   Coronary artery disease    Depression    Depression, prolonged 06/26/2013   Diverticulosis    GERD (gastroesophageal reflux disease)    Heart attack ([place]) 2009   History of esophageal stricture    Hyperlipidemia    Hypertension    IBS (irritable bowel syndrome)    Internal hemorrhoids    Osteoarthritis    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   Peripheral neuropathy    Renal cell carcinoma 1997   Left kidney   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[place]) 07/16/2019   Shoulder pain, right    Sleep apnea    wears CPAP   Sleep talking    Snoring 12/25/2013   Wears glasses     Past Surgical History:  Procedure Laterality Date   ANTERIOR CERVICAL DECOMP/DISCECTOMY FUSION N/A 08/27/2014   Procedure: Cervical six-seven anterior cervical decompression fusion with removal of hardware at Cervical five-six;  Surgeon: Broadus John  [name], MD;  Location: [facility];  Service: Neurosurgery;  Laterality: N/A;  Cervical six-seven anterior cervical decompression fusion with removal of hardware at Cervical five-six   [place], 2013   CARDIAC CATHETERIZATION  05/10/2007   Minimal CAD, normal LV systolic function, medical management   CARDIAC CATHETERIZATION  04/08/2008   RCA ulcerated 70-80% stenosis, stented with a 3x39mm Endeavor stent at 13atm for 50sec, reduced from 80% ulcerated stenosis to 0%.   CARDIAC CATHETERIZATION  05/07/2009   50% distal left main disease-IVUS or flow wire too dangerous in particular setting to perform intervention.   CARDIAC CATHETERIZATION  03/18/2010   Medical management   CARDIOVASCULAR STRESS TEST  02/09/2012   Normal, no significant wall abnormalities noted   CARPAL TUNNEL  RELEASE Bilateral    COLONOSCOPY W/ BIOPSIES AND POLYPECTOMY     CORONARY STENT PLACEMENT     FEMUR FRACTURE SURGERY Left 1995   HERNIA REPAIR     NECK SURGERY  2005   NEPHRECTOMY Left    secondary to cancer   TOTAL KNEE ARTHROPLASTY Left 2005   TOTAL KNEE ARTHROPLASTY Right 04/13/2018   Procedure: RIGHT TOTAL KNEE ARTHROPLASTY;  Surgeon: Netta Cedars, MD;  Location: [facility];  Service: Orthopedics;  Laterality: Right;     Current Outpatient Medications on File Prior to Visit  Medication Sig Dispense Refill   albuterol (VENTOLIN HFA) 108 (90 Base) MCG/ACT inhaler INHALE 2 PUFFS INTO THE LUNGS EVERY 4 HOURS AS NEEDED FOR WHEEZING 6.7 g 0   Alirocumab (PRALUENT) 150 MG/ML SOAJ Inject 150 mg into the skin every 14 (fourteen) days. 2 pen 6   amLODipine (NORVASC) 10 MG tablet TK 1 T PO D     ARIPiprazole (ABILIFY) 15 MG tablet Take 15 mg by mouth every morning.     Armodafinil 250 MG tablet Take 250 mg by mouth daily.   0   aspirin 81 MG tablet Take 1 tablet (81 mg total) by mouth daily. (Patient taking differently: Take 325 mg by mouth daily. )     BELSOMRA 20 MG TABS Take 20 mg by mouth at bedtime.   0   clotrimazole-betamethasone (LOTRISONE) cream APPLY TO THE AFFECTED AREA EVERY 12 HOURS     DULoxetine (CYMBALTA) 60 MG capsule Take 1 capsule (60 mg total) by mouth 2 (two) times daily. 60 capsule 2   ezetimibe (ZETIA) 10 MG tablet Take 1 tablet (10 mg total) by mouth daily. NEED OV. (Patient taking differently: Take 10 mg by mouth daily. ) 1 tablet 0   finasteride (PROSCAR) 5 MG tablet Take 5 mg by mouth daily.      fish oil-omega-3 fatty acids 1000 MG capsule Take 1 g by mouth at bedtime.      fluticasone (FLOVENT HFA) 110 MCG/ACT inhaler Inhale 2 puffs into the lungs 2 (two) times daily. 12 g 5   gemfibrozil (LOPID) 600 MG tablet Take 600 mg by mouth 2 (two) times daily.     hydrOXYzine (ATARAX/VISTARIL) 50 MG tablet Take 150 mg by mouth at bedtime.       ipratropium-albuterol (DUONEB) 0.5-2.5 (3) MG/3ML SOLN Take 3 mLs by nebulization every 6 (six) hours as needed (wheezing, coughing, shortness of breath). 75 mL 3   isosorbide mononitrate (IMDUR) 30 MG 24 hr tablet take 1 tablet by mouth once daily (Patient taking differently: Take 30 mg by mouth at bedtime) 15 tablet 0   levothyroxine (SYNTHROID, LEVOTHROID) 25 MCG tablet Take 25 mcg by mouth daily before  breakfast.      mometasone-formoterol (DULERA) 200-5 MCG/ACT AERO Inhale 2 puffs into the lungs 2 (two) times daily. 13 g 1   montelukast (SINGULAIR) 10 MG tablet TAKE 1 TABLET(10 MG) BY MOUTH AT BEDTIME 30 tablet 5   Multiple Vitamin (MULTIVITAMIN) tablet Take 1 tablet by mouth at bedtime.      omeprazole (PRILOSEC) 20 MG capsule TAKE 1 CAPSULE BY MOUTH TWICE A DAY AS DIRECTED (Patient taking differently: Take 20 mg by mouth at bedtime. ) 60 capsule 4   omeprazole (PRILOSEC) 20 MG capsule Take 1 tablet once a day and may take it twice a day during reflux flares. 60 capsule 5   REPATHA SURECLICK XX123456 MG/ML SOAJ Inject 1 mL into the skin every 14 (fourteen) days.     silodosin (RAPAFLO) 8 MG CAPS capsule Take 8 mg by mouth at bedtime.      Tiotropium Bromide Monohydrate (SPIRIVA RESPIMAT) 1.25 MCG/ACT AERS Inhale 2 puffs into the lungs daily. 4 g 5   vitamin C (ASCORBIC ACID) 500 MG tablet Take 500 mg by mouth 2 (two) times daily.      XOLAIR 150 MG injection INJECT 150MG  SUBCUTANEOUSLY EVERY 4 WEEKS (GIVEN AT  PRESCRIBERS OFFICE) 1 each 11   Current Facility-Administered Medications on File Prior to Visit  Medication Dose Route Frequency Provider Last Rate Last Dose   omalizumab [name]) injection 150 mg  150 mg Subcutaneous Q28 days Kozlow, Donnamarie Poag, MD   150 mg at 08/12/19 1112    Allergies  Allergen Reactions   Contrast Media [Iodinated Diagnostic Agents] Other (See Comments)    Was told not to take d/t pt only having 1 kidney   Nsaids Other (See Comments)    Was told not to  take d/t pt only having 1 kidney   Penicillins Hives, Itching and Other (See Comments)    Has patient had a PCN reaction causing immediate rash, facial/tongue/throat swelling, SOB or lightheadedness with hypotension: Unknown Has patient had a PCN reaction causing severe rash involving mucus membranes or skin necrosis: Unknown Has patient had a PCN reaction that required hospitalization: Unknown Has patient had a PCN reaction occurring within the last 10 years: No If all of the above answers are "NO", then may proceed with Cephalosporin use.    Sulfonamide Derivatives Hives and Itching   Codeine Nausea Only   Statins Other (See Comments)    Myalgias    Physical exam:  Today's Vitals   08/28/19 1101  BP: 132/82  Pulse: 66  Temp: (!) 96.8 F (36 C)  Weight: 203 lb (92.1 kg)  Height: 5' 7.5" (1.715 m)   Body mass index is 31.33 kg/m.   Wt Readings from Last 3 Encounters:  08/28/19 203 lb (92.1 kg)  07/16/19 205 lb 5 oz (93.1 kg)  03/14/19 209 lb 12.8 oz (95.2 kg)     Ht Readings from Last 3 Encounters:  08/28/19 5' 7.5" (1.715 m)  07/16/19 5' 7.5" (1.715 m)  07/15/19 5' 7.5" (1.715 m)      General: The patient is awake, alert and appears not in acute distress. The patient is well groomed. Head: Normocephalic, atraumatic. Neck is supple. Mallampati 4,  neck circumference:18 inches . Nasal airflow  patent.  severe hoarseness, decreased facial expression. Retrognathia is not  seen.  Dental status:  Cardiovascular:  Regular rate and cardiac rhythm by pulse,  without distended neck veins. Respiratory: Lungs are clear to auscultation.  Skin:  Without evidence of ankle edema, or  rash. Trunk: The patient's posture is erect.   Neurologic exam : The patient is awake and alert, oriented to place and time.   Memory subjective described as intact.  Attention span & concentration ability appears normal.  Speech is fluent,  without  dysarthria, dysphonia or aphasia.  Mood and  affect are appropriate.   Cranial nerves: no loss of smell or taste reported  Pupils are equal and briskly reactive to light. Normal reaction to accomodation. Funduscopic exam deferred  Extraocular movements in vertical and horizontal planes were intact and without nystagmus. No Diplopia. Visual fields by finger perimetry are intact. Hearing was intact to soft voice and finger rubbing.   Facial sensation intact to fine touch.  Facial motor strength is symmetric but facial mimic is reduced- masked face- and tongue and uvula move midline.  Neck ROM : rotation, tilt and flexion extension were normal for age and shoulder shrug was symmetrical.    Motor exam:  Symmetric bulk, tone and ROM.   Generalized elevated tone with bilateral biceps cogwheeling, symmetric grip strength.   Sensory:  Fine touch, pinprick and vibration were tested  and  normal.  Proprioception tested in the upper extremities was normal.   Coordination: Rapid alternating movements in the fingers/hands were of reduced speed.  The Finger-to-nose maneuver was slower but without evidence of ataxia, dysmetria or tremor.   Gait and station: Patient could rise unassisted from a seated position,  ( RN observation) walked without assistive device.  Stance is of normal width/ base and the patient turned with 3 steps.  Toe and heel walk were deferred.  Deep tendon reflexes: in the  upper and lower extremities are symmetric and intact.  Babinski response was deferred .       After spending a total time of  50  minutes face to face and additional time for physical and neurologic examination, review of laboratory studies,  personal review of imaging studies, reports and results of other testing and review of referral information / records as far as provided in visit, I have established the following assessments:  1) Headache syndrome improved since last seen by r Willis, not present in AM. No waking him up from sleep.  2) non  -restorative sleep, on CPAP. Reduced AHI but high airleaks. Average compliance. Machine is 70 years old.  3) chronic sleep initiation insomnia- worse over the last 5-6 month, in spite of medication.    My Plan is to proceed with:  1) he fears he would not be able to sleep in a sleep- lab. Prefers HST over REM PSG.  2) insomnia unrelated to apnea, chronic and peristent on medication. 3) hypersomnia and non restorative sleep with prolonged sleep time ( 10-14 hours !!!) - very likely depression related.  4) I strongly suspect depression to be still present and also wonder about early parkinson's disease.    I would like to thank dr Floyde Parkins and  Burnard Bunting, MD [address] [place],  [place] [address],  for allowing me to meet with and to take care of this pleasant patient.   In short, Jared [name] is presenting with hypersomnia, delayed sleep onset and non restorative sleep-symptoms that can be attributed to depression , but we need to rule out REM BD, and improve apnea control.   Therefor HST ordered, new machine will be an autotitration device. Wife witnessed REM BD.  I plan to follow up either personally or through our NP within 2-3  month.  CC: I will share my notes with Dr Jannifer Franklin and Dr. [name]. .  Electronically signed by: Larey Seat, MD 08/28/2019 11:02 AM  [facility] Board certified by The [employer] of Sleep Medicine and Diplomate of the [employer] of Sleep Medicine. Board certified In Neurology through the [place], Fellow of the [employer] of Neurology. 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

## 2019-08-28 NOTE — Patient Instructions (Signed)
Hypersomnia Hypersomnia is a condition in which a person feels very tired during the day even though he or she gets plenty of sleep at night. A person with this condition may take naps during the day and may find it very difficult to wake up from sleep. Hypersomnia may affect a person's ability to think, concentrate, drive, or remember things. What are the causes? The cause of this condition may not be known. Possible causes include:  Certain medicines.  Sleep disorders, such as narcolepsy and sleep apnea.  Injury to the head, brain, or spinal cord.  Drug or alcohol use.  Gastroesophageal reflux disease (GERD).  Tumors.  Certain medical conditions, such as depression, diabetes, or an underactive thyroid gland (hypothyroidism). What are the signs or symptoms? The main symptoms of hypersomnia include:  Feeling very tired throughout the day, regardless of how much sleep you got the night before.  Having trouble waking up. Others may find it difficult to wake you up when you are sleeping.  Sleeping for longer and longer periods at a time.  Taking naps throughout the day. Other symptoms may include:  Feeling restless, anxious, or annoyed.  Lacking energy.  Having trouble with: ? Remembering. ? Speaking. ? Thinking.  Loss of appetite.  Seeing, hearing, tasting, smelling, or feeling things that are not real (hallucinations). How is this diagnosed? This condition may be diagnosed based on:  Your symptoms and medical history.  Your sleeping habits. Your health care provider may ask you to write down your sleeping habits in a daily sleep log, along with any symptoms you have.  A series of tests that are done while you sleep (sleep study or polysomnogram).  A test that measures how quickly you can fall asleep during the day (daytime nap study or multiple sleep latency test). How is this treated? Treatment can help you manage your condition. Treatment may  include:  Following a regular sleep routine.  Lifestyle changes, such as changing your eating habits, getting regular exercise, and avoiding alcohol or caffeinated beverages.  Taking medicines to make you more alert (stimulants) during the day.  Treating any underlying medical causes of hypersomnia. Follow these instructions at home: Sleep routine   Schedule the same bedtime and wake-up time each day.  Practice a relaxing bedtime routine. This may include reading, meditation, deep breathing, or taking a warm bath before going to sleep.  Get regular exercise each day. Avoid strenuous exercise in the evening hours.  Keep your sleep environment at a cooler temperature, darkened, and quiet.  Sleep with pillows and a mattress that are comfortable and supportive.  Schedule short 20-minute naps for when you feel sleepiest during the day.  Talk with your employer or teachers about your hypersomnia. If possible, adjust your schedule so that: ? You have a regular daytime work schedule. ? You can take a scheduled nap during the day. ? You do not have to work or be active at night.  Do not eat a heavy meal for a few hours before bedtime. Eat your meals at about the same times every day.  Avoid drinking alcohol or caffeinated beverages. Safety   Do not drive or use heavy machinery if you are sleepy. Ask your health care provider if it is safe for you to drive.  Wear a life jacket when swimming or spending time near water. General instructions  Take supplements and over-the-counter and prescription medicines only as told by your health care provider.  Keep a sleep log that will help  your doctor manage your condition. This may include information about: ? What time you go to bed each night. ? How often you wake up at night. ? How many hours you sleep at night. ? How often and for how long you nap during the day. ? Any observations from others, such as leg movements during sleep,  sleep walking, or snoring.  Keep all follow-up visits as told by your health care provider. This is important. Contact a health care provider if:  You have new symptoms.  Your symptoms get worse. Get help right away if:  You have serious thoughts about hurting yourself or someone else. If you ever feel like you may hurt yourself or others, or have thoughts about taking your own life, get help right away. You can go to your nearest emergency department or call:  Your local emergency services (911 in the [place]  A suicide crisis helpline, such as the [facility] at 2070565416. This is open 24 hours a day. Summary  Hypersomnia refers to a condition in which you feel very tired during the day even though you get plenty of sleep at night.  A person with this condition may take naps during the day and may find it very difficult to wake up from sleep.  Hypersomnia may affect a person's ability to think, concentrate, drive, or remember things.  Treatment, such as following a regular sleep routine and making some lifestyle changes, can help you manage your condition. This information is not intended to replace advice given to you by your health care provider. Make sure you discuss any questions you have with your health care provider. Document Released: 08/26/2002 Document Revised: 09/07/2017 Document Reviewed: 09/07/2017 Elsevier Patient Education  [place].           Please remember to try to maintain good sleep hygiene, which means: Keep a regular sleep and wake schedule, try not to exercise or have a meal within 2 hours of your bedtime, try to keep your bedroom conducive for sleep, that is, cool and dark, without light distractors such as an illuminated alarm clock, and refrain from watching TV right before sleep or in the middle of the night and do not keep the TV or radio on during the night. Also, try not to use or play on electronic devices  at bedtime, such as your cell phone, tablet PC or laptop. If you like to read at bedtime on an electronic device, try to dim the background light as much as possible. Do not eat in the middle of the night.   We will request a sleep study.    We will look for leg twitching and snoring or sleep apnea.   For chronic insomnia, you are best followed by a psychiatrist and/or sleep psychologist.   We will call you with the sleep study results and make a follow up appointment if needed.     Trazodone tablets What is this medicine? TRAZODONE (TRAZ oh done) is used to treat depression. This medicine may be used for other purposes; ask your health care provider or pharmacist if you have questions. COMMON BRAND NAME(S): Desyrel What should I tell my health care provider before I take this medicine? They need to know if you have any of these conditions:  attempted suicide or thinking about it  bipolar disorder  bleeding problems  glaucoma  heart disease, or previous heart attack  irregular heart beat  kidney or liver disease  low levels of  sodium in the blood  an unusual or allergic reaction to trazodone, other medicines, foods, dyes or preservatives  pregnant or trying to get pregnant  breast-feeding How should I use this medicine? Take this medicine by mouth with a glass of water. Follow the directions on the prescription label. Take this medicine shortly after a meal or a light snack. Take your medicine at regular intervals. Do not take your medicine more often than directed. Do not stop taking this medicine suddenly except upon the advice of your doctor. Stopping this medicine too quickly may cause serious side effects or your condition may worsen. A special MedGuide will be given to you by the pharmacist with each prescription and refill. Be sure to read this information carefully each time. Talk to your pediatrician regarding the use of this medicine in children. Special care may  be needed. Overdosage: If you think you have taken too much of this medicine contact a poison control center or emergency room at once. NOTE: This medicine is only for you. Do not share this medicine with others. What if I miss a dose? If you miss a dose, take it as soon as you can. If it is almost time for your next dose, take only that dose. Do not take double or extra doses. What may interact with this medicine? Do not take this medicine with any of the following medications:  certain medicines for fungal infections like fluconazole, itraconazole, ketoconazole, posaconazole, voriconazole  cisapride  dronedarone  linezolid  MAOIs like Carbex, Eldepryl, Marplan, Nardil, and Parnate  mesoridazine  methylene blue (injected into a vein)  pimozide  saquinavir  thioridazine This medicine may also interact with the following medications:  alcohol  antiviral medicines for HIV or AIDS  aspirin and aspirin-like medicines  barbiturates like phenobarbital  certain medicines for blood pressure, heart disease, irregular heart beat  certain medicines for depression, anxiety, or psychotic disturbances  certain medicines for migraine headache like almotriptan, eletriptan, frovatriptan, naratriptan, rizatriptan, sumatriptan, zolmitriptan  certain medicines for seizures like carbamazepine and phenytoin  certain medicines for sleep  certain medicines that treat or prevent blood clots like dalteparin, enoxaparin, warfarin  digoxin  fentanyl  lithium  NSAIDS, medicines for pain and inflammation, like ibuprofen or naproxen  other medicines that prolong the QT interval (cause an abnormal heart rhythm) like dofetilide  rasagiline  supplements like St. John's wort, kava kava, valerian  tramadol  tryptophan This list may not describe all possible interactions. Give your health care provider a list of all the medicines, herbs, non-prescription drugs, or dietary supplements you  use. Also tell them if you smoke, drink alcohol, or use illegal drugs. Some items may interact with your medicine. What should I watch for while using this medicine? Tell your doctor if your symptoms do not get better or if they get worse. Visit your doctor or health care professional for regular checks on your progress. Because it may take several weeks to see the full effects of this medicine, it is important to continue your treatment as prescribed by your doctor. Patients and their families should watch out for new or worsening thoughts of suicide or depression. Also watch out for sudden changes in feelings such as feeling anxious, agitated, panicky, irritable, hostile, aggressive, impulsive, severely restless, overly excited and hyperactive, or not being able to sleep. If this happens, especially at the beginning of treatment or after a change in dose, call your health care professional. Dennis Bast may get drowsy or dizzy. Do not  drive, use machinery, or do anything that needs mental alertness until you know how this medicine affects you. Do not stand or sit up quickly, especially if you are an older patient. This reduces the risk of dizzy or fainting spells. Alcohol may interfere with the effect of this medicine. Avoid alcoholic drinks. This medicine may cause dry eyes and blurred vision. If you wear contact lenses you may feel some discomfort. Lubricating drops may help. See your eye doctor if the problem does not go away or is severe. Your mouth may get dry. Chewing sugarless gum, sucking hard candy and drinking plenty of water may help. Contact your doctor if the problem does not go away or is severe. What side effects may I notice from receiving this medicine? Side effects that you should report to your doctor or health care professional as soon as possible:  allergic reactions like skin rash, itching or hives, swelling of the face, lips, or tongue  elevated mood, decreased need for sleep, racing  thoughts, impulsive behavior  confusion  fast, irregular heartbeat  feeling faint or lightheaded, falls  feeling agitated, angry, or irritable  loss of balance or coordination  painful or prolonged erections  restlessness, pacing, inability to keep still  suicidal thoughts or other mood changes  tremors  trouble sleeping  seizures  unusual bleeding or bruising Side effects that usually do not require medical attention (report to your doctor or health care professional if they continue or are bothersome):  change in sex drive or performance  change in appetite or weight  constipation  headache  muscle aches or pains  nausea This list may not describe all possible side effects. Call your doctor for medical advice about side effects. You may report side effects to FDA at 1-800-FDA-1088. Where should I keep my medicine? Keep out of the reach of children. Store at room temperature between 15 and 30 degrees C (59 to 86 degrees F). Protect from light. Keep container tightly closed. Throw away any unused medicine after the expiration date. NOTE: This sheet is a summary. It may not cover all possible information. If you have questions about this medicine, talk to your doctor, pharmacist, or health care provider.  2020 Elsevier/Gold Standard (2018-08-28 11:46:46)

## 2019-09-04 ENCOUNTER — Ambulatory Visit (INDEPENDENT_AMBULATORY_CARE_PROVIDER_SITE_OTHER): Payer: Medicare Other | Admitting: Neurology

## 2019-09-04 DIAGNOSIS — S12601S Unspecified nondisplaced fracture of seventh cervical vertebra, sequela: Secondary | ICD-10-CM

## 2019-09-04 DIAGNOSIS — F5105 Insomnia due to other mental disorder: Secondary | ICD-10-CM

## 2019-09-04 DIAGNOSIS — I2510 Atherosclerotic heart disease of native coronary artery without angina pectoris: Secondary | ICD-10-CM

## 2019-09-04 DIAGNOSIS — J4540 Moderate persistent asthma, uncomplicated: Secondary | ICD-10-CM

## 2019-09-04 DIAGNOSIS — G2111 Neuroleptic induced parkinsonism: Secondary | ICD-10-CM

## 2019-09-04 DIAGNOSIS — G4733 Obstructive sleep apnea (adult) (pediatric): Secondary | ICD-10-CM | POA: Diagnosis not present

## 2019-09-04 DIAGNOSIS — G478 Other sleep disorders: Secondary | ICD-10-CM

## 2019-09-04 DIAGNOSIS — G4750 Parasomnia, unspecified: Secondary | ICD-10-CM

## 2019-09-09 ENCOUNTER — Other Ambulatory Visit: Payer: Self-pay

## 2019-09-09 ENCOUNTER — Ambulatory Visit (INDEPENDENT_AMBULATORY_CARE_PROVIDER_SITE_OTHER): Payer: Medicare Other

## 2019-09-09 DIAGNOSIS — J4540 Moderate persistent asthma, uncomplicated: Secondary | ICD-10-CM | POA: Diagnosis not present

## 2019-09-09 NOTE — Procedures (Signed)
  Patient Information     First Name: Jared Last Name: [name] ID: [id number]  Birth Date: 18-May-1951 Age: 68 Gender: Male  Referring Provider: Floyde Parkins, MD  Burnard Bunting, MD BMI: 30.7 (W=203 lb, H=5' 8'')  Neck Circ.:  18 '' Epworth:  9/24   Sleep Study Information    Study Date: Sep 04, 2019 S/H/A Version: 001.001.001.001 / 4.0.1515 / 62  History:    Patient of Dr Floyde Parkins, MD. Dr Jannifer Franklin started treatment of severe depression with Cymbalta and he reportedly feels better and  sleeps better when taking Hydralazine and Belsomra ( in therapy with Metta Clines). Patient reports headaches have improved, sleepiness is progressing.  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       Summary & Diagnosis:    The patient's AHI ( apnea -hypopnea Index) indicates severe sleep apnea at 39.4/h and was further exacerbated by REM sleep to an AHI of 62.4/h. Moderate snoring was recorded and mild hypoxemia.  Recommendations:     Strongly REM dependent obstructive sleep apnea with a severe degree at baseline, needing Positive Airway pressure therapy. I will order CPAP autotitration device at 6-16 cm water with 1 cm EPR and interface of patient's choice and comfort.   Interpreting Physician: Larey Seat, MD           Sleep Summary    Oxygen Saturation Statistics     Start Study Time: End Study Time: Total  Recording Time:  10:18:21 PM 8:22:22 AM 10 h, 4 min  Total Sleep Time % REM of Sleep Time:  9 h, 22 min 19.7    Mean: 93 Minimum: 83 Maximum: 99  Mean of Desaturations Nadirs (%):   90  Oxygen Desat. %:   4-9 10-20 >20 Total  Events Number Total   235  16 93.6 6.4  0 0.0  251 100.0  Oxygen Saturation: <90 <=88 <85 <80 <70  Duration (minutes): Sleep % 12.0 2.1  6.1 0.3  1.1 0.1 0.0 0.0 0.0 0.0     Respiratory Indices      Total Events REM NREM All Night  pRDI:  374  pAHI:  367 ODI:  251  pAHIc:  29   % CSR: 0.0 63.5 62.4 53.1 3.3 34.5 33.8 20.6 3.1 40.2 39.4 27.0 3.1       Pulse Rate Statistics during Sleep (BPM)      Mean:  65 Minimum: 41 Maximum: 90    Indices are calculated using technically valid sleep time of 9 h, 18 min. pRDI/pAHI are calculated using 02  desaturations ? 3% Sit N/A Body Position Statistics  Position Supine Prone Right Left Non-Supine  Sleep (min) 150.0 203.0 206.0 0.0 409.0  Sleep % 26.7 36.1 36.7 0.0 72.8  pRDI 62.2 35.8 27.8 N/A 31.8  pAHI 62.2 34.9 26.9 N/A 30.9  ODI 51.8 23.0 12.9 N/A 17.9     Snoring Statistics Snoring Level (dB) >40 >50 >60 >70 >80 >Threshold (45)  Sleep (min) 183.4 17.3 3.3 0.2 0.0 40.1  Sleep % 32.6 3.1 0.6 0.0 0.0 7.1    Mean: 41 dB

## 2019-09-09 NOTE — Addendum Note (Signed)
Addended by: Larey Seat on: 09/09/2019 11:09 AM   Modules accepted: Orders

## 2019-09-09 NOTE — Progress Notes (Signed)
Summary & Diagnosis:   The patient's AHI ( apnea -hypopnea Index) indicates severe sleep  apnea at 39.4/h and was further exacerbated by REM sleep to an  AHI of 62.4/h. Moderate snoring was recorded and mild hypoxemia.   Recommendations:    Strongly REM dependent obstructive sleep apnea with a severe  degree at baseline, needing Positive Airway pressure therapy. I  will order CPAP autotitration device at 6-16 cm water with 1 cm  EPR and interface of patient's choice and comfort.   Interpreting Physician: Larey Seat, MD    PS : please add CC to Dr [name].

## 2019-09-11 ENCOUNTER — Telehealth: Payer: Self-pay | Admitting: Neurology

## 2019-09-11 NOTE — Telephone Encounter (Signed)
I called pt. I advised pt that Dr. Brett Fairy reviewed their sleep study results and found that pt has severe sleep apnea. Dr. Brett Fairy recommends that pt start auto CPAP 6-16 cm water pressure. I reviewed PAP compliance expectations with the pt. Pt is agreeable to starting a CPAP. I advised pt that an order will be sent to a DME, [facility], and [facility] will call the pt within about one week after they file with the pt's insurance. [facility] will show the pt how to use the machine, fit for masks, and troubleshoot the CPAP if needed. A follow up appt was made for insurance purposes with Dr. Brett Fairy on March 4,2021 at 3:30 pm. Pt verbalized understanding to arrive 15 minutes early and bring their CPAP. A letter with all of this information in it will be mailed to the pt as a reminder. I verified with the pt that the address we have on file is correct. Pt verbalized understanding of results. Pt had no questions at this time but was encouraged to call back if questions arise. I have sent the order to [facility] and have received confirmation that they have received the order.

## 2019-09-11 NOTE — Telephone Encounter (Signed)
Called patient to discuss sleep study results. No answer at this time. LVM for the patient to call back.  DPR is on file and up to date

## 2019-09-11 NOTE — Telephone Encounter (Signed)
Patient called back due to missed call. Please follow up.

## 2019-09-11 NOTE — Telephone Encounter (Signed)
-----   Message from Larey Seat, MD sent at 09/09/2019 11:09 AM EST ----- Summary & Diagnosis:   The patient's AHI ( apnea -hypopnea Index) indicates severe sleep  apnea at 39.4/h and was further exacerbated by REM sleep to an  AHI of 62.4/h. Moderate snoring was recorded and mild hypoxemia.   Recommendations:    Strongly REM dependent obstructive sleep apnea with a severe  degree at baseline, needing Positive Airway pressure therapy. I  will order CPAP autotitration device at 6-16 cm water with 1 cm  EPR and interface of patient's choice and comfort.   Interpreting Physician: Larey Seat, MD    PS : please add CC to Dr [name].

## 2019-10-07 ENCOUNTER — Ambulatory Visit: Payer: Self-pay

## 2019-10-11 ENCOUNTER — Other Ambulatory Visit: Payer: Self-pay

## 2019-10-11 ENCOUNTER — Ambulatory Visit (INDEPENDENT_AMBULATORY_CARE_PROVIDER_SITE_OTHER): Payer: Medicare Other | Admitting: *Deleted

## 2019-10-11 DIAGNOSIS — J4540 Moderate persistent asthma, uncomplicated: Secondary | ICD-10-CM

## 2019-10-14 ENCOUNTER — Telehealth: Payer: Self-pay | Admitting: Cardiovascular Disease

## 2019-10-14 NOTE — Telephone Encounter (Signed)
Called and spoke w/pharmacy stated that the pt is on praluent and not repatha

## 2019-10-14 NOTE — Telephone Encounter (Signed)
[facility] was calling to confirm that we received a fax for the patient's Prior Authorization for [place].   If the office needs to contact [facility], please use the toll free number [phone] and  reference # [id number]

## 2019-10-15 ENCOUNTER — Other Ambulatory Visit: Payer: Self-pay | Admitting: Allergy

## 2019-10-17 ENCOUNTER — Ambulatory Visit (INDEPENDENT_AMBULATORY_CARE_PROVIDER_SITE_OTHER): Payer: Medicare Other | Admitting: Family Medicine

## 2019-10-17 ENCOUNTER — Encounter: Payer: Self-pay | Admitting: Family Medicine

## 2019-10-17 ENCOUNTER — Other Ambulatory Visit: Payer: Self-pay

## 2019-10-17 VITALS — BP 111/66 | HR 67 | Temp 96.9°F | Ht 67.0 in | Wt 207.2 lb

## 2019-10-17 DIAGNOSIS — G4489 Other headache syndrome: Secondary | ICD-10-CM | POA: Diagnosis not present

## 2019-10-17 DIAGNOSIS — G4733 Obstructive sleep apnea (adult) (pediatric): Secondary | ICD-10-CM

## 2019-10-17 DIAGNOSIS — Z9989 Dependence on other enabling machines and devices: Secondary | ICD-10-CM | POA: Diagnosis not present

## 2019-10-17 DIAGNOSIS — G2111 Neuroleptic induced parkinsonism: Secondary | ICD-10-CM

## 2019-10-17 NOTE — Progress Notes (Signed)
PATIENT: Jared [name] DOB: 1951-05-20  REASON FOR VISIT: follow up HISTORY FROM: patient  Chief Complaint  Patient presents with  . Follow-up    new rm. alone. CPAP works well. States that he has random shakkiness in left arm.     HISTORY OF PRESENT ILLNESS: Today 10/17/19 Jared [name] is a 69 y.o. 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 Dr Jannifer Franklin increased duloxetine to 60mg  twice daily for headaches. Headaches continued and he reports that his PCP stopped duloxetine. Since, headaches have resolved. He continues to have intermittent shaking of left arm. Shaking can with activity or at rest. May happen several times a day or may not happen for a day or two. Shaking lasts about 2-3 minutes then resolves. He has been on Ability for 6-7 years, followed by Dr Darryl Lent, psychiatry. Tremor started shortly afterwards. She feels that he is doing well overall and not bothered by shaking. He does not feel symptoms are worsening.   She was previously on CPAP therapy but needed a new CPAP machine. New machine delivered in 09/23/2019. He continued to use his old CPAP until 10/06/2019. He is doing well with new CPAP machine. He feels that it works well and he is feeling better. Compliance report does reveal consistent use since 10/06/2019. Residual AHI 2.4 on 6-16cmH20. No significant leak noted.   HISTORY: (copied from Dr Edwena Felty note on 08/28/2019)  Grayling Congress Schlosseris a 69 y.o. year old White or Caucasian male patientseen for a sleep consultation- the patient's primary neurologist is Dr Jannifer Franklin.  referralon 08/28/2019 from PCP for a new evaluation.  Chiefconcernaccording to patient : I am getting more and more fatigued, CPAP machine is 71 years old.   I have the pleasure of seeing Jared [name] today,a right-handed White or Caucasian male with a possible sleep disorder. 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  Patient reports headaches have improved, sleepiness is progressing.  Dr Jannifer Franklin started w treatment of severe depression with Cymbalta. Once a day and he sleeps better when taking Hydralizine and Belsomra with Metta Clines.    Mr. Beh was seen for a baseline diagnostic polysomnography on January 14, 2014 referred by Dr. Burnard Bunting and at the time less sleepy than today with the Epworth Sleepiness Scale endorsed at 9 out of 24 points.  Sleep efficiency was poor 70.6% and highly fragmented.  The patient had multiple hypopneas rather than apneas 66 respiratory events consisted of 55 shallow breathing spells 1 obstructive apnea and 4 central apneas and 6 mixed apneas with an AHI of 12.9.  The lowest oxygen saturation was 77% at nadir with 41 minutes of desaturation in total.  He also had frequent periodic limb movements at the time he also moved during REM sleep indicative of REM sleep behavior disorder.  He returned for CPAP titration on 06 Feb 2014 his AHI was reduced significantly and his periodic limb movements also improved.  Nadir was now 82% with only 2 minutes of desaturation time.  He started on 10 cmH2O pressure with 3 cm flex  or air sense and a nasal pillow.  Normal sinus rhythm by EKG was no injury was noted.  Dr. Jannifer Franklin:  08-10-2019 History of present illness:  Jared [name] is a 69 year old right-handed white male with a history of renal cell carcinoma  in the past, history of coronary artery disease, asthma, sleep apnea on CPAP, and history of depression on medications to include Abilify.  The patient claims that in early August he began having headaches in the right frontotemporal region.  He has never had any problems with headaches at any time in his life, headaches are very unusual for him.  The patient has noted that the headaches may last 30 minutes to an hour and a half, and may come and go up to 3 times a day, sometimes waking him up out of sleep.  The patient has no nausea, vomiting or photophobia or phonophobia with the headache.  He denies any vision changes or neck pain.  He may take Tylenol with good improvement of the headache.  The headaches are usually a 5 or 6 out of 10 on a pain scale and are described as a dull achy pain.  The patient reports no sinus drainage or allergy symptoms.  He denies any numbness or weakness of the face, arms, legs.  He has noted recently some problems with tremor that will come and go involving the left upper extremity.  He denies issues controlling the bowels or the bladder.  He was sent to this office for an evaluation.  He recently had MRI of the brain that was relatively unremarkable, no source of the headache was seen.  The headaches are always on the right frontal temporal area, never anywhere else.  He does not get any redness of the eye or tearing of the eye with a headache.  The patient was placed on Lamisil around the time of onset of headache, but he ran out of his medication a week ago and he has continued to have headaches.   REVIEW OF SYSTEMS: Out of a complete 14 system review of symptoms, the patient complains only of the following symptoms, tremor left arm and all other reviewed systems are negative.  ALLERGIES: Allergies  Allergen Reactions  . 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 Nsaids Other (See Comments)     Was told not to take d/t pt only having 1 kidney  . Penicillins Hives, Itching and Other (See Comments)    Has patient had a PCN reaction causing immediate rash, facial/tongue/throat swelling, SOB or lightheadedness with hypotension: Unknown Has patient had a PCN reaction causing severe rash involving mucus membranes or skin necrosis: Unknown Has patient had a PCN reaction that required hospitalization: Unknown Has patient had a PCN reaction occurring within the last 10 years: No If all of the above answers are "NO", then may proceed with Cephalosporin use.   . Sulfonamide Derivatives Hives and Itching  . Codeine Nausea Only  . Statins Other (See Comments)    Myalgias    HOME MEDICATIONS: Outpatient Medications Prior to Visit  Medication Sig Dispense Refill  . albuterol (VENTOLIN HFA) 108 (90 Base) MCG/ACT inhaler INHALE 2 PUFFS INTO THE LUNGS EVERY 4 HOURS AS NEEDED FOR WHEEZING 6.7 g 0  . amLODipine (NORVASC) 10 MG tablet TK 1 T PO D    . ARIPiprazole (ABILIFY) 15 MG tablet Take 15 mg by mouth every morning.    Marland Kitchen  Armodafinil 250 MG tablet Take 250 mg by mouth daily.   0  . aspirin 325 MG tablet Take 325 mg by mouth daily. Taking daily otc    . DULERA 200-5 MCG/ACT AERO INHALE 2 PUFFS INTO THE LUNGS TWICE DAILY 13 g 3  . DULoxetine (CYMBALTA) 60 MG capsule Take 1 capsule (60 mg total) by mouth 2 (two) times daily. 60 capsule 2  . ezetimibe (ZETIA) 10 MG tablet Take 1 tablet (10 mg total) by mouth daily. NEED OV. (Patient taking differently: Take 10 mg by mouth daily. ) 1 tablet 0  . finasteride (PROSCAR) 5 MG tablet Take 5 mg by mouth daily.     . fish oil-omega-3 fatty acids 1000 MG capsule Take 1 g by mouth at bedtime.     . fluticasone (FLOVENT HFA) 110 MCG/ACT inhaler Inhale 2 puffs into the lungs 2 (two) times daily. 12 g 5  . gemfibrozil (LOPID) 600 MG tablet Take 600 mg by mouth 2 (two) times daily.    . isosorbide mononitrate (IMDUR) 30 MG 24 hr tablet take 1 tablet by mouth  once daily (Patient taking differently: Take 30 mg by mouth at bedtime) 15 tablet 0  . levothyroxine (SYNTHROID, LEVOTHROID) 25 MCG tablet Take 25 mcg by mouth daily before breakfast.     . Multiple Vitamin (MULTIVITAMIN) tablet Take 1 tablet by mouth at bedtime.     Marland Kitchen omeprazole (PRILOSEC) 20 MG capsule TAKE 1 CAPSULE BY MOUTH TWICE A DAY AS DIRECTED (Patient taking differently: Take 20 mg by mouth at bedtime. ) 60 capsule 4  . REPATHA SURECLICK [id number] MG/ML SOAJ Inject 1 mL into the skin every 14 (fourteen) days.    . silodosin (RAPAFLO) 8 MG CAPS capsule Take 8 mg by mouth at bedtime.     . vitamin C (ASCORBIC ACID) 500 MG tablet Take 500 mg by mouth 2 (two) times daily.     [name] 150 MG injection INJECT 150MG  SUBCUTANEOUSLY EVERY 4 WEEKS (GIVEN AT  PRESCRIBERS OFFICE) 1 each 11  . Alirocumab (PRALUENT) 150 MG/ML SOAJ Inject 150 mg into the skin every 14 (fourteen) days. 2 pen 6  . aspirin 81 MG tablet Take 1 tablet (81 mg total) by mouth daily.    . BELSOMRA 20 MG TABS Take 20 mg by mouth at bedtime.   0  . clotrimazole-betamethasone (LOTRISONE) cream APPLY TO THE AFFECTED AREA EVERY 12 HOURS    . hydrOXYzine (ATARAX/VISTARIL) 50 MG tablet Take 150 mg by mouth at bedtime.     Marland Kitchen ipratropium-albuterol (DUONEB) 0.5-2.5 (3) MG/3ML SOLN Take 3 mLs by nebulization every 6 (six) hours as needed (wheezing, coughing, shortness of breath). 75 mL 3  . montelukast (SINGULAIR) 10 MG tablet TAKE 1 TABLET(10 MG) BY MOUTH AT BEDTIME 30 tablet 5  . omeprazole (PRILOSEC) 20 MG capsule Take 1 tablet once a day and may take it twice a day during reflux flares. 60 capsule 5  . Tiotropium Bromide Monohydrate (SPIRIVA RESPIMAT) 1.25 MCG/ACT AERS Inhale 2 puffs into the lungs daily. 4 g 5  . traZODone (DESYREL) 50 MG tablet Take 1 tablet (50 mg total) by mouth at bedtime as needed for sleep. 30 tablet 5   Facility-Administered Medications Prior to Visit  Medication Dose Route Frequency Provider Last Rate Last Admin   . omalizumab [name]) injection 150 mg  150 mg Subcutaneous Q28 days Jiles Prows, MD   150 mg at 10/11/19 1038    PAST MEDICAL HISTORY: Past Medical History:  Diagnosis Date  . Cervical vertebral fusion 05/02/2014   Now presenting with C5-6 radiculopathy  . Coronary artery disease   . Depression   . Depression, prolonged 06/26/2013  . Diverticulosis   . GERD (gastroesophageal reflux disease)   . Heart attack ([place]) 2009  . History of esophageal stricture   . Hyperlipidemia   . Hypertension   . IBS (irritable bowel syndrome)   . Internal hemorrhoids   . Osteoarthritis   . 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 Peripheral neuropathy   . Renal cell carcinoma 1997   Left kidney  . 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 Shoulder pain, right   . Sleep apnea    wears CPAP  . Sleep talking   . Snoring 12/25/2013  . Wears glasses     PAST SURGICAL HISTORY: Past Surgical History:  Procedure Laterality Date  . ANTERIOR CERVICAL DECOMP/DISCECTOMY FUSION N/A 08/27/2014   Procedure: Cervical six-seven anterior cervical decompression fusion with removal of hardware at Cervical five-six;  Surgeon: Erline Levine, MD;  Location: [facility];  Service: Neurosurgery;  Laterality: N/A;  Cervical six-seven anterior cervical decompression fusion with removal of hardware at Cervical five-six  . BACK SURGERY  1992, 2013  . CARDIAC CATHETERIZATION  05/10/2007   Minimal CAD, normal LV systolic function, medical management  . CARDIAC CATHETERIZATION  04/08/2008   RCA ulcerated 70-80% stenosis, stented with a 3x80mm Endeavor stent at 13atm for 50sec, reduced from 80% ulcerated stenosis to 0%.  . CARDIAC CATHETERIZATION  05/07/2009   50% distal left main disease-IVUS or flow wire too dangerous in particular setting to perform intervention.  Marland Kitchen CARDIAC CATHETERIZATION  03/18/2010   Medical management  . CARDIOVASCULAR STRESS TEST  02/09/2012   Normal, no significant wall abnormalities  noted  . CARPAL TUNNEL RELEASE Bilateral   . COLONOSCOPY W/ BIOPSIES AND POLYPECTOMY    . CORONARY STENT PLACEMENT    . FEMUR FRACTURE SURGERY Left 1995  . HERNIA REPAIR    . NECK SURGERY  2005  . NEPHRECTOMY Left    secondary to cancer  . TOTAL KNEE ARTHROPLASTY Left 2005  . TOTAL KNEE ARTHROPLASTY Right 04/13/2018   Procedure: RIGHT TOTAL KNEE ARTHROPLASTY;  Surgeon: Netta Cedars, MD;  Location: [facility];  Service: Orthopedics;  Laterality: Right;    FAMILY HISTORY: Family History  Problem Relation Age of Onset  . Asthma Mother   . Heart disease Mother   . Hypertension Mother   . Esophageal cancer Father   . Barrett's esophagus Father   . Bone cancer Father        mets from esophagus  . Heart disease Father   . Heart disease Paternal Grandfather   . Heart attack Paternal Grandfather 109  . Colon cancer Maternal Uncle   . Diabetes Sister   . Cancer Sister        Cervical cancer  . Stomach cancer Neg Hx     SOCIAL HISTORY: Social History   Socioeconomic History  . Marital status: Married    Spouse name: [name]  . Number of children: 1  . Years of education: 14  . Highest education level: Not on file  Occupational History  . Occupation: Financial controller. Landscape Design/horticulture    Employer: [employer]  . Occupation: OWNER    Employer: [employer]  Tobacco Use  . Smoking status: Never Smoker  . Smokeless tobacco: Never Used  Substance and Sexual Activity  . Alcohol use: Not Currently  . Drug use: No  .  Sexual activity: Not on file  Other Topics Concern  . Not on file  Social History Narrative   Patient is married [name]).   Patient drinks one cup of coffee but not everyday.   Patient has one child.   Patient has a college education.   Patient is right-handed.   Social Determinants of Health   Financial Resource Strain:   . Difficulty of Paying Living Expenses: Not on file  Food Insecurity:   . Worried About Charity fundraiser in the Last Year: Not  on file  . Ran Out of Food in the Last Year: Not on file  Transportation Needs:   . Lack of Transportation (Medical): Not on file  . Lack of Transportation (Non-Medical): Not on file  Physical Activity:   . Days of Exercise per Week: Not on file  . Minutes of Exercise per Session: Not on file  Stress:   . Feeling of Stress : Not on file  Social Connections:   . Frequency of Communication with Friends and Family: Not on file  . Frequency of Social Gatherings with Friends and Family: Not on file  . Attends Religious Services: Not on file  . Active Member of Clubs or Organizations: Not on file  . Attends Archivist Meetings: Not on file  . Marital Status: Not on file  Intimate Partner Violence:   . Fear of Current or Ex-Partner: Not on file  . Emotionally Abused: Not on file  . Physically Abused: Not on file  . Sexually Abused: Not on file      PHYSICAL EXAM  Vitals:   10/17/19 1034  BP: 111/66  Pulse: 67  Temp: (!) 96.9 F (36.1 C)  Weight: 207 lb 3.2 oz (94 kg)  Height: 5\' 7"  (1.702 m)   Body mass index is 32.45 kg/m.  Generalized: Well developed, in no acute distress Cardiology: normal rate and rhythm, no murmur noted Respiratory: clear to auscultation bilaterally  Neurological examination  Mentation: Alert oriented to time, place, history taking. Follows all commands speech and language fluent Cranial nerve II-XII: Pupils were equal round reactive to light. Extraocular movements were full, visual field were full on confrontational test. Facial sensation and strength were normal. Uvula tongue midline. Head turning and shoulder shrug  were normal and symmetric. Motor: The motor testing reveals 5 over 5 strength of all 4 extremities. Good symmetric motor tone is noted throughout. No tremor noted on exam, no bradykinesia with fingertaps Sensory: Sensory testing is intact to soft touch on all 4 extremities. No evidence of extinction is noted.  Coordination:  Cerebellar testing reveals good finger-nose-finger and heel-to-shin bilaterally.  Gait and station: Gait is normal. Slight decrease in arm swing   DIAGNOSTIC DATA (LABS, IMAGING, TESTING) - I reviewed patient records, labs, notes, testing and imaging myself where available.  No flowsheet data found.   Lab Results  Component Value Date   WBC 9.8 10/08/2018   HGB 12.9 (L) 10/08/2018   HCT 41.5 10/08/2018   MCV 80.1 10/08/2018   PLT 214 10/08/2018      Component Value Date/Time   NA 137 10/08/2018 1159   K 4.7 10/08/2018 1159   CL 102 10/08/2018 1159   CO2 25 10/08/2018 1159   GLUCOSE 118 (H) 10/08/2018 1159   BUN 24 (H) 10/08/2018 1159   CREATININE 1.32 (H) 10/08/2018 1159   CREATININE 1.41 (H) 06/25/2013 1051   CALCIUM 9.3 10/08/2018 1159   PROT 7.7 10/08/2018 1159   ALBUMIN 3.8  10/08/2018 1159   AST 22 10/08/2018 1159   ALT 29 10/08/2018 1159   ALKPHOS 76 10/08/2018 1159   BILITOT 0.3 10/08/2018 1159   GFRNONAA 55 (L) 10/08/2018 1159   GFRAA >60 10/08/2018 1159   Lab Results  Component Value Date   CHOL 226 (H) 10/16/2018   HDL 41 10/16/2018   LDLCALC 107 (H) 10/16/2018   TRIG 390 (H) 10/16/2018   CHOLHDL 5.5 (H) 10/16/2018   Lab Results  Component Value Date   HGBA1C (H) 03/17/2010    5.9 (NOTE)                                                                       According to the ADA Clinical Practice Recommendations for 2011, when HbA1c is used as a screening test:   >=6.5%   Diagnostic of Diabetes Mellitus           (if abnormal result  is confirmed)  5.7-6.4%   Increased risk of developing Diabetes Mellitus  References:Diagnosis and Classification of Diabetes Mellitus,Diabetes [id number] 1):S62-S69 and Standards of Medical Care in         Diabetes - 2011,Diabetes Care,2011,34  (Suppl 1):S11-S61.   No results found for: VITAMINB12 Lab Results  Component Value Date   TSH 4.925 (H) 06/25/2013     ASSESSMENT AND PLAN 69 y.o. 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 here with     ICD-10-CM   1. 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 Other headache syndrome  G44.89   3. OSA on CPAP  G47.33    Z99.89     Lyndall is doing well. He reports headaches have resolved. He does continue to note an intermittent tremor of left arm, most likely related to Ability. We will continue to monitor symptoms closely. He does not feel that adding medications to help with tremor will be beneficial at this time. He has gotten started with new CPAP and tolerating well. Unfortunately, our follow up today does not meet insurance requirements as initial set up was 09/23/2019. We will have him follow up for CPAP compliance review in 4-5 weeks. He will follow up for headaches and tremors in 1 year, sooner if needed. He verbalizes understanding and agreement with this plan.    No orders of the defined types were placed in this encounter.    No orders of the defined types were placed in this encounter.     I spent 15 minutes with the patient. 50% of this time was spent counseling and educating patient on plan of care and medications.    Debbora Presto, FNP-C 10/17/2019, 11:27 AM [facility] [address] [place], [place] [address] [phone]

## 2019-10-17 NOTE — Patient Instructions (Signed)
We will continue to monitor symptoms. I am happy that headaches have resolved. We will continue to monitor shaking of left arm. Let us know if symptoms worsen.   Stay well hydrated, eat a healthy, well balanced diet and continue exercise.   Follow up with me in 4-5 weeks for CPAP compliance review.   General Headache Without Cause A headache is pain or discomfort that is felt around the head or neck area. There are many causes and types of headaches. In some cases, the cause may not be found. Follow these instructions at home: Watch your condition for any changes. Let your doctor know about them. Take these steps to help with your condition: Managing pain      Take over-the-counter and prescription medicines only as told by your doctor.  Lie down in a dark, quiet room when you have a headache.  If told, put ice on your head and neck area: ? Put ice in a plastic bag. ? Place a towel between your skin and the bag. ? Leave the ice on for 20 minutes, 2-3 times per day.  If told, put heat on the affected area. Use the heat source that your doctor recommends, such as a moist heat pack or a heating pad. ? Place a towel between your skin and the heat source. ? Leave the heat on for 20-30 minutes. ? Remove the heat if your skin turns bright red. This is very important if you are unable to feel pain, heat, or cold. You may have a greater risk of getting burned.  Keep lights dim if bright lights bother you or make your headaches worse. Eating and drinking  Eat meals on a regular schedule.  If you drink alcohol: ? Limit how much you use to:  0-1 drink a day for women.  0-2 drinks a day for men. ? Be aware of how much alcohol is in your drink. In the [place] one drink equals one 12 oz bottle of beer (355 mL), one 5 oz glass of wine (148 mL), or one 1 oz glass of hard liquor (44 mL).  Stop drinking caffeine, or reduce how much caffeine you drink. General instructions   Keep a journal  to find out if certain things bring on headaches. For example, write down: ? What you eat and drink. ? How much sleep you get. ? Any change to your diet or medicines.  Get a massage or try other ways to relax.  Limit stress.  Sit up straight. Do not tighten (tense) your muscles.  Do not use any products that contain nicotine or tobacco. This includes cigarettes, e-cigarettes, and chewing tobacco. If you need help quitting, ask your doctor.  Exercise regularly as told by your doctor.  Get enough sleep. This often means 7-9 hours of sleep each night.  Keep all follow-up visits as told by your doctor. This is important. Contact a doctor if:  Your symptoms are not helped by medicine.  You have a headache that feels different than the other headaches.  You feel sick to your stomach (nauseous) or you throw up (vomit).  You have a fever. Get help right away if:  Your headache gets very bad quickly.  Your headache gets worse after a lot of physical activity.  You keep throwing up.  You have a stiff neck.  You have trouble seeing.  You have trouble speaking.  You have pain in the eye or ear.  Your muscles are weak or you lose muscle  control.  You lose your balance or have trouble walking.  You feel like you will pass out (faint) or you pass out.  You are mixed up (confused).  You have a seizure. Summary  A headache is pain or discomfort that is felt around the head or neck area.  There are many causes and types of headaches. In some cases, the cause may not be found.  Keep a journal to help find out what causes your headaches. Watch your condition for any changes. Let your doctor know about them.  Contact a doctor if you have a headache that is different from usual, or if your headache is not helped by medicine.  Get help right away if your headache gets very bad, you throw up, you have trouble seeing, you lose your balance, or you have a seizure. This  information is not intended to replace advice given to you by your health care provider. Make sure you discuss any questions you have with your health care provider. Document Revised: 03/26/2018 Document Reviewed: 03/26/2018 Elsevier Patient Education  [place].   Sleep Apnea Sleep apnea affects breathing during sleep. It causes breathing to stop for a short time or to become shallow. It can also increase the risk of:  Heart attack.  Stroke.  Being very overweight (obese).  Diabetes.  Heart failure.  Irregular heartbeat. The goal of treatment is to help you breathe normally again. What are the causes? There are three kinds of sleep apnea:  Obstructive sleep apnea. This is caused by a blocked or collapsed airway.  Central sleep apnea. This happens when the brain does not send the right signals to the muscles that control breathing.  Mixed sleep apnea. This is a combination of obstructive and central sleep apnea. The most common cause of this condition is a collapsed or blocked airway. This can happen if:  Your throat muscles are too relaxed.  Your tongue and tonsils are too large.  You are overweight.  Your airway is too small. What increases the risk?  Being overweight.  Smoking.  Having a small airway.  Being older.  Being male.  Drinking alcohol.  Taking medicines to calm yourself (sedatives or tranquilizers).  Having family members with the condition. What are the signs or symptoms?  Trouble staying asleep.  Being sleepy or tired during the day.  Getting angry a lot.  Loud snoring.  Headaches in the morning.  Not being able to focus your mind (concentrate).  Forgetting things.  Less interest in sex.  Mood swings.  Personality changes.  Feelings of sadness (depression).  Waking up a lot during the night to pee (urinate).  Dry mouth.  Sore throat. How is this diagnosed?  Your medical history.  A physical exam.  A test  that is done when you are sleeping (sleep study). The test is most often done in a sleep lab but may also be done at home. How is this treated?   Sleeping on your side.  Using a medicine to get rid of mucus in your nose (decongestant).  Avoiding the use of alcohol, medicines to help you relax, or certain pain medicines (narcotics).  Losing weight, if needed.  Changing your diet.  Not smoking.  Using a machine to open your airway while you sleep, such as: ? An oral appliance. This is a mouthpiece that shifts your lower jaw forward. ? A CPAP device. This device blows air through a mask when you breathe out (exhale). ? An EPAP  device. This has valves that you put in each nostril. ? A BPAP device. This device blows air through a mask when you breathe in (inhale) and breathe out.  Having surgery if other treatments do not work. It is important to get treatment for sleep apnea. Without treatment, it can lead to:  High blood pressure.  Coronary artery disease.  In men, not being able to have an erection (impotence).  Reduced thinking ability. Follow these instructions at home: Lifestyle  Make changes that your doctor recommends.  Eat a healthy diet.  Lose weight if needed.  Avoid alcohol, medicines to help you relax, and some pain medicines.  Do not use any products that contain nicotine or tobacco, such as cigarettes, e-cigarettes, and chewing tobacco. If you need help quitting, ask your doctor. General instructions  Take over-the-counter and prescription medicines only as told by your doctor.  If you were given a machine to use while you sleep, use it only as told by your doctor.  If you are having surgery, make sure to tell your doctor you have sleep apnea. You may need to bring your device with you.  Keep all follow-up visits as told by your doctor. This is important. Contact a doctor if:  The machine that you were given to use during sleep bothers you or does not  seem to be working.  You do not get better.  You get worse. Get help right away if:  Your chest hurts.  You have trouble breathing in enough air.  You have an uncomfortable feeling in your back, arms, or stomach.  You have trouble talking.  One side of your body feels weak.  A part of your face is hanging down. These symptoms may be an emergency. Do not wait to see if the symptoms will go away. Get medical help right away. Call your local emergency services (911 in the [place] Do not drive yourself to the hospital. Summary  This condition affects breathing during sleep.  The most common cause is a collapsed or blocked airway.  The goal of treatment is to help you breathe normally while you sleep. This information is not intended to replace advice given to you by your health care provider. Make sure you discuss any questions you have with your health care provider. Document Revised: 06/22/2018 Document Reviewed: 05/01/2018 Elsevier Patient Education  [place].

## 2019-10-18 NOTE — Progress Notes (Signed)
I have read the note, and I agree with the clinical assessment and plan.  Analyssa Downs K Dhanya Bogle   

## 2019-10-21 ENCOUNTER — Other Ambulatory Visit: Payer: Self-pay

## 2019-10-21 ENCOUNTER — Telehealth: Payer: Self-pay

## 2019-10-21 NOTE — Telephone Encounter (Signed)
PA for [facility] was initiated through [email or web address] pending approval.

## 2019-10-22 NOTE — Telephone Encounter (Signed)
PA was initiated through covered my meds for the right coverage insurance.

## 2019-10-22 NOTE — Telephone Encounter (Signed)
This medication or product is on your plan's list of covered drugs. Prior authorization is not required at this time

## 2019-10-24 NOTE — Telephone Encounter (Signed)
PA is still pending.  

## 2019-10-28 NOTE — Telephone Encounter (Signed)
PA is still pending response through Cover My Meds.

## 2019-10-30 NOTE — Telephone Encounter (Signed)
Patient called back and was informed that we needed an updated insurance card. Patient stated he will bring one into the office.

## 2019-10-30 NOTE — Telephone Encounter (Signed)
Left message to patient to contact the office so that we can get an updated Insurance card to contact the customer service to see why medication is not being approved.

## 2019-11-08 ENCOUNTER — Other Ambulatory Visit: Payer: Self-pay

## 2019-11-08 ENCOUNTER — Ambulatory Visit (INDEPENDENT_AMBULATORY_CARE_PROVIDER_SITE_OTHER): Payer: Medicare Other | Admitting: *Deleted

## 2019-11-08 DIAGNOSIS — J4540 Moderate persistent asthma, uncomplicated: Secondary | ICD-10-CM

## 2019-11-16 ENCOUNTER — Emergency Department (HOSPITAL_COMMUNITY): Payer: Medicare Other

## 2019-11-16 ENCOUNTER — Encounter (HOSPITAL_COMMUNITY): Payer: Self-pay | Admitting: *Deleted

## 2019-11-16 ENCOUNTER — Inpatient Hospital Stay (HOSPITAL_COMMUNITY)
Admission: EM | Admit: 2019-11-16 | Discharge: 2019-11-20 | DRG: 177 | Disposition: A | Discharge status: Home Health | Payer: Medicare Other | Attending: Internal Medicine | Admitting: Internal Medicine

## 2019-11-16 ENCOUNTER — Other Ambulatory Visit: Payer: Self-pay

## 2019-11-16 DIAGNOSIS — I2510 Atherosclerotic heart disease of native coronary artery without angina pectoris: Secondary | ICD-10-CM | POA: Diagnosis present

## 2019-11-16 DIAGNOSIS — Z800 Family history of malignant neoplasm of digestive organs: Secondary | ICD-10-CM

## 2019-11-16 DIAGNOSIS — Z79890 Hormone replacement therapy (postmenopausal): Secondary | ICD-10-CM

## 2019-11-16 DIAGNOSIS — Z79899 Other long term (current) drug therapy: Secondary | ICD-10-CM

## 2019-11-16 DIAGNOSIS — G4730 Sleep apnea, unspecified: Secondary | ICD-10-CM | POA: Diagnosis present

## 2019-11-16 DIAGNOSIS — Z85528 Personal history of other malignant neoplasm of kidney: Secondary | ICD-10-CM | POA: Diagnosis not present

## 2019-11-16 DIAGNOSIS — Z833 Family history of diabetes mellitus: Secondary | ICD-10-CM

## 2019-11-16 DIAGNOSIS — E872 Acidosis: Secondary | ICD-10-CM | POA: Diagnosis present

## 2019-11-16 DIAGNOSIS — U071 COVID-19: Secondary | ICD-10-CM | POA: Diagnosis present

## 2019-11-16 DIAGNOSIS — J1282 Pneumonia due to coronavirus disease 2019: Secondary | ICD-10-CM | POA: Diagnosis present

## 2019-11-16 DIAGNOSIS — N183 Chronic kidney disease, stage 3 unspecified: Secondary | ICD-10-CM | POA: Diagnosis present

## 2019-11-16 DIAGNOSIS — I252 Old myocardial infarction: Secondary | ICD-10-CM | POA: Diagnosis not present

## 2019-11-16 DIAGNOSIS — Z96653 Presence of artificial knee joint, bilateral: Secondary | ICD-10-CM | POA: Diagnosis present

## 2019-11-16 DIAGNOSIS — N400 Enlarged prostate without lower urinary tract symptoms: Secondary | ICD-10-CM | POA: Diagnosis present

## 2019-11-16 DIAGNOSIS — Z981 Arthrodesis status: Secondary | ICD-10-CM | POA: Diagnosis not present

## 2019-11-16 DIAGNOSIS — I129 Hypertensive chronic kidney disease with stage 1 through stage 4 chronic kidney disease, or unspecified chronic kidney disease: Secondary | ICD-10-CM | POA: Diagnosis present

## 2019-11-16 DIAGNOSIS — F0280 Dementia in other diseases classified elsewhere without behavioral disturbance: Secondary | ICD-10-CM | POA: Diagnosis present

## 2019-11-16 DIAGNOSIS — Z880 Allergy status to penicillin: Secondary | ICD-10-CM

## 2019-11-16 DIAGNOSIS — G219 Secondary parkinsonism, unspecified: Secondary | ICD-10-CM | POA: Diagnosis present

## 2019-11-16 DIAGNOSIS — N179 Acute kidney failure, unspecified: Secondary | ICD-10-CM | POA: Diagnosis present

## 2019-11-16 DIAGNOSIS — Z91041 Radiographic dye allergy status: Secondary | ICD-10-CM | POA: Diagnosis not present

## 2019-11-16 DIAGNOSIS — Z905 Acquired absence of kidney: Secondary | ICD-10-CM

## 2019-11-16 DIAGNOSIS — E785 Hyperlipidemia, unspecified: Secondary | ICD-10-CM | POA: Diagnosis present

## 2019-11-16 DIAGNOSIS — F329 Major depressive disorder, single episode, unspecified: Secondary | ICD-10-CM | POA: Diagnosis present

## 2019-11-16 DIAGNOSIS — K219 Gastro-esophageal reflux disease without esophagitis: Secondary | ICD-10-CM | POA: Diagnosis present

## 2019-11-16 DIAGNOSIS — Z8249 Family history of ischemic heart disease and other diseases of the circulatory system: Secondary | ICD-10-CM

## 2019-11-16 DIAGNOSIS — Z955 Presence of coronary angioplasty implant and graft: Secondary | ICD-10-CM

## 2019-11-16 DIAGNOSIS — Z882 Allergy status to sulfonamides status: Secondary | ICD-10-CM

## 2019-11-16 DIAGNOSIS — J9601 Acute respiratory failure with hypoxia: Secondary | ICD-10-CM | POA: Diagnosis present

## 2019-11-16 DIAGNOSIS — J45909 Unspecified asthma, uncomplicated: Secondary | ICD-10-CM | POA: Diagnosis present

## 2019-11-16 DIAGNOSIS — E039 Hypothyroidism, unspecified: Secondary | ICD-10-CM | POA: Diagnosis present

## 2019-11-16 DIAGNOSIS — Z888 Allergy status to other drugs, medicaments and biological substances status: Secondary | ICD-10-CM | POA: Diagnosis not present

## 2019-11-16 DIAGNOSIS — Z7982 Long term (current) use of aspirin: Secondary | ICD-10-CM

## 2019-11-16 DIAGNOSIS — G629 Polyneuropathy, unspecified: Secondary | ICD-10-CM | POA: Diagnosis present

## 2019-11-16 DIAGNOSIS — Z825 Family history of asthma and other chronic lower respiratory diseases: Secondary | ICD-10-CM

## 2019-11-16 LAB — CBC WITH DIFFERENTIAL/PLATELET
Abs Immature Granulocytes: 0.07 10*3/uL (ref 0.00–0.07)
Basophils Absolute: 0 10*3/uL (ref 0.0–0.1)
Basophils Relative: 0 %
Eosinophils Absolute: 0 10*3/uL (ref 0.0–0.5)
Eosinophils Relative: 0 %
HCT: 45.5 % (ref 39.0–52.0)
Hemoglobin: 14.7 g/dL (ref 13.0–17.0)
Immature Granulocytes: 1 %
Lymphocytes Relative: 7 %
Lymphs Abs: 0.5 10*3/uL — ABNORMAL LOW (ref 0.7–4.0)
MCH: 27.3 pg (ref 26.0–34.0)
MCHC: 32.3 g/dL (ref 30.0–36.0)
MCV: 84.4 fL (ref 80.0–100.0)
Monocytes Absolute: 0.6 10*3/uL (ref 0.1–1.0)
Monocytes Relative: 8 %
Neutro Abs: 6.3 10*3/uL (ref 1.7–7.7)
Neutrophils Relative %: 84 %
Platelets: 270 10*3/uL (ref 150–400)
RBC: 5.39 MIL/uL (ref 4.22–5.81)
RDW: 16.3 % — ABNORMAL HIGH (ref 11.5–15.5)
WBC: 7.5 10*3/uL (ref 4.0–10.5)
nRBC: 0 % (ref 0.0–0.2)

## 2019-11-16 LAB — LACTIC ACID, PLASMA
Lactic Acid, Venous: 1.6 mmol/L (ref 0.5–1.9)
Lactic Acid, Venous: 2 mmol/L (ref 0.5–1.9)

## 2019-11-16 LAB — POC SARS CORONAVIRUS 2 AG -  ED: SARS Coronavirus 2 Ag: POSITIVE — AB

## 2019-11-16 LAB — COMPREHENSIVE METABOLIC PANEL
ALT: 60 U/L — ABNORMAL HIGH (ref 0–44)
AST: 68 U/L — ABNORMAL HIGH (ref 15–41)
Albumin: 3.7 g/dL (ref 3.5–5.0)
Alkaline Phosphatase: 72 U/L (ref 38–126)
Anion gap: 12 (ref 5–15)
BUN: 34 mg/dL — ABNORMAL HIGH (ref 8–23)
CO2: 19 mmol/L — ABNORMAL LOW (ref 22–32)
Calcium: 8.6 mg/dL — ABNORMAL LOW (ref 8.9–10.3)
Chloride: 102 mmol/L (ref 98–111)
Creatinine, Ser: 2 mg/dL — ABNORMAL HIGH (ref 0.61–1.24)
GFR calc Af Amer: 38 mL/min — ABNORMAL LOW (ref >60)
GFR calc non Af Amer: 33 mL/min — ABNORMAL LOW (ref >60)
Glucose, Bld: 119 mg/dL — ABNORMAL HIGH (ref 70–99)
Potassium: 4 mmol/L (ref 3.5–5.1)
Sodium: 133 mmol/L — ABNORMAL LOW (ref 135–145)
Total Bilirubin: 0.7 mg/dL (ref 0.3–1.2)
Total Protein: 8.5 g/dL — ABNORMAL HIGH (ref 6.5–8.1)

## 2019-11-16 LAB — D-DIMER, QUANTITATIVE: D-Dimer, Quant: 1.3 ug/mL-FEU — ABNORMAL HIGH (ref 0.00–0.50)

## 2019-11-16 LAB — TRIGLYCERIDES: Triglycerides: 154 mg/dL — ABNORMAL HIGH (ref <150)

## 2019-11-16 LAB — FIBRINOGEN: Fibrinogen: 752 mg/dL — ABNORMAL HIGH (ref 210–475)

## 2019-11-16 LAB — PROTIME-INR
INR: 0.9 (ref 0.8–1.2)
Prothrombin Time: 12.5 seconds (ref 11.4–15.2)

## 2019-11-16 LAB — PROCALCITONIN: Procalcitonin: 2.62 ng/mL

## 2019-11-16 LAB — LACTATE DEHYDROGENASE: LDH: 380 U/L — ABNORMAL HIGH (ref 98–192)

## 2019-11-16 MED ORDER — HEPARIN SODIUM (PORCINE) 5000 UNIT/ML IJ SOLN
5000.0000 [IU] | Freq: Three times a day (TID) | INTRAMUSCULAR | Status: DC
  Administered 2019-11-16 – 2019-11-20 (×11): 5000 [IU] via SUBCUTANEOUS
  Filled 2019-11-16 (×10): qty 1

## 2019-11-16 MED ORDER — ASCORBIC ACID 500 MG PO TABS
500.0000 mg | ORAL_TABLET | Freq: Every day | ORAL | Status: DC
  Administered 2019-11-17 – 2019-11-20 (×4): 500 mg via ORAL
  Filled 2019-11-16 (×4): qty 1

## 2019-11-16 MED ORDER — SODIUM CHLORIDE 0.9 % IV BOLUS
500.0000 mL | Freq: Once | INTRAVENOUS | Status: AC
  Administered 2019-11-16: 500 mL via INTRAVENOUS

## 2019-11-16 MED ORDER — GUAIFENESIN-DM 100-10 MG/5ML PO SYRP: 10.0000 mL | ORAL_SOLUTION | ORAL | Status: DC | PRN

## 2019-11-16 MED ORDER — SODIUM CHLORIDE 0.9 % IV SOLN: 100.0000 mg | Freq: Every day | INTRAVENOUS | Status: DC

## 2019-11-16 MED ORDER — SODIUM CHLORIDE 0.9 % IV SOLN
100.0000 mg | Freq: Every day | INTRAVENOUS | Status: AC
  Administered 2019-11-17 – 2019-11-20 (×4): 100 mg via INTRAVENOUS
  Filled 2019-11-16 (×4): qty 20

## 2019-11-16 MED ORDER — ALBUTEROL SULFATE HFA 108 (90 BASE) MCG/ACT IN AERS
2.0000 | INHALATION_SPRAY | Freq: Four times a day (QID) | RESPIRATORY_TRACT | Status: DC
  Administered 2019-11-16 – 2019-11-20 (×13): 2 via RESPIRATORY_TRACT
  Filled 2019-11-16: qty 6.7

## 2019-11-16 MED ORDER — DEXAMETHASONE SODIUM PHOSPHATE 10 MG/ML IJ SOLN
6.0000 mg | Freq: Every day | INTRAMUSCULAR | Status: DC
  Administered 2019-11-17 – 2019-11-19 (×3): 6 mg via INTRAVENOUS
  Filled 2019-11-16 (×3): qty 1

## 2019-11-16 MED ORDER — ONDANSETRON HCL 4 MG PO TABS: 4.0000 mg | ORAL_TABLET | Freq: Four times a day (QID) | ORAL | Status: DC | PRN

## 2019-11-16 MED ORDER — SODIUM CHLORIDE 0.9 % IV SOLN
200.0000 mg | Freq: Once | INTRAVENOUS | Status: DC
  Filled 2019-11-16: qty 40

## 2019-11-16 MED ORDER — PANTOPRAZOLE SODIUM 40 MG PO TBEC
40.0000 mg | DELAYED_RELEASE_TABLET | Freq: Every day | ORAL | Status: DC
  Administered 2019-11-17 – 2019-11-20 (×4): 40 mg via ORAL
  Filled 2019-11-16 (×4): qty 1

## 2019-11-16 MED ORDER — SODIUM CHLORIDE 0.9 % IV SOLN
100.0000 mg | INTRAVENOUS | Status: AC
  Administered 2019-11-16 (×2): 100 mg via INTRAVENOUS
  Filled 2019-11-16 (×2): qty 100

## 2019-11-16 MED ORDER — ACETAMINOPHEN 325 MG PO TABS: 650.0000 mg | ORAL_TABLET | Freq: Four times a day (QID) | ORAL | Status: DC | PRN

## 2019-11-16 MED ORDER — ONDANSETRON HCL 4 MG/2ML IJ SOLN: 4.0000 mg | Freq: Four times a day (QID) | INTRAMUSCULAR | Status: DC | PRN

## 2019-11-16 MED ORDER — DEXAMETHASONE SODIUM PHOSPHATE 10 MG/ML IJ SOLN
6.0000 mg | Freq: Once | INTRAMUSCULAR | Status: AC
  Administered 2019-11-16: 6 mg via INTRAVENOUS
  Filled 2019-11-16: qty 1

## 2019-11-16 MED ORDER — SODIUM CHLORIDE 0.9 % IV SOLN: INTRAVENOUS | Status: DC

## 2019-11-16 MED ORDER — ZINC SULFATE 220 (50 ZN) MG PO CAPS
220.0000 mg | ORAL_CAPSULE | Freq: Every day | ORAL | Status: DC
  Administered 2019-11-17 – 2019-11-20 (×4): 220 mg via ORAL
  Filled 2019-11-16 (×4): qty 1

## 2019-11-16 NOTE — ED Triage Notes (Signed)
Pt states he woke this morning with SHOB, sat 89% RA Triage. Started on 2 L [place]  In triage.

## 2019-11-16 NOTE — Progress Notes (Signed)
Pharmacy: Remdesivir   Patient is a 69 y.o. M with COVID and respiratory symptoms requiring hospitalization.  Pharmacy has been consulted for remdesivir dosing.   - ALT: 60    A/P:  - Patient meets criteria for remdesivir. Will initiate remdesivir 200 mg once followed by 100 mg daily x 4 days.  - Follow clinical course.  Clayburn Pert, PharmD, BCPS 11/16/2019  9:02 PM

## 2019-11-16 NOTE — ED Notes (Signed)
Pt sitting up in bed. Full monitor on. Water given. Pt denies any other needs. Family on the phone with pt was updated with plan of care. Will continue to monitor.

## 2019-11-16 NOTE — ED Provider Notes (Signed)
[facility] DEPT Provider Note   CSN: [id number] Arrival date & time: 11/16/19  1756     History Chief Complaint  Patient presents with  . Shortness of Breath    Jared [name] is a 69 y.o. male.  Level 5 caveat for respiratory distress.  Patient with history of hypertension, asthma, Parkinson's disease, sleep apnea presenting with difficulty breathing and shortness of breath worsening since this morning.  States he has had body aches and chills for several weeks but denies any fever.  Today he felt short of breath despite using his inhalers at home.  Denies any chest pain, abdominal pain, nausea, vomiting, diarrhea.  Did get his first coronavirus vaccine but not the second.  He states he has had no known coronavirus exposures  The history is provided by the patient. The history is limited by the condition of the patient.  Shortness of Breath Associated symptoms: cough, fever and headaches   Associated symptoms: no abdominal pain, no chest pain, no rash and no vomiting        Past Medical History:  Diagnosis Date  . Cervical vertebral fusion 05/02/2014   Now presenting with C5-6 radiculopathy  . Coronary artery disease   . Depression   . Depression, prolonged 06/26/2013  . Diverticulosis   . GERD (gastroesophageal reflux disease)   . Heart attack ([place]) 2009  . History of esophageal stricture   . Hyperlipidemia   . Hypertension   . IBS (irritable bowel syndrome)   . Internal hemorrhoids   . Osteoarthritis   . 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 Peripheral neuropathy   . Renal cell carcinoma 1997   Left kidney  . 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 Shoulder pain, right   . Sleep apnea    wears CPAP  . Sleep talking   . Snoring 12/25/2013  . Wears glasses     Patient Active Problem List   Diagnosis Date Noted  . OSA on CPAP 08/28/2019  . Pain due to onychomycosis of toenails of both feet 08/27/2019  .  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 Severe persistent asthma without complication [id number]  . Seasonal and perennial allergic rhinitis 01/30/2019  . Trigger finger of right thumb 10/30/2018  . Trigger index finger of right hand 10/30/2018  . 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 Stress fracture of femur 03/15/2018  . Severe persistent asthma with acute exacerbation 12/21/2017  . Other allergic rhinitis 12/21/2017  . LPRD (laryngopharyngeal reflux disease) 12/21/2017  . Tear of medial meniscus of knee 12/12/2017  . Osteoarthritis of knee 10/03/2017  . Allergic rhinoconjunctivitis 05/21/2015  . C7 cervical fracture ([place]) 08/27/2014  . Cervical spine fracture ([place]) 08/23/2014  . Abnormal brain MRI 07/23/2014  . 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 Cervical vertebral fusion 05/02/2014  . Snoring 12/25/2013  . 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 Insomnia due to mental condition 12/25/2013  . Sleep talking   . Essential hypertension 07/08/2013  . Depression, prolonged 06/26/2013  . Night sweats 06/26/2013  . Chest pain at rest 06/25/2013  . Bronchiectasis without acute exacerbation ([place]) 01/16/2008  . PULMONARY NODULE 12/20/2007  . HYPERNEPHROMA 11/27/2007  . DYSLIPIDEMIA 11/27/2007  . Coronary atherosclerosis 11/27/2007  . HEMORRHOIDS, INTERNAL 11/27/2007  . Moderate persistent asthma 11/27/2007  . ESOPHAGITIS 11/27/2007  . ESOPHAGEAL STRICTURE 11/27/2007  . GERD 11/27/2007  . GASTRITIS, ACUTE 11/27/2007  . DIVERTICULOSIS, COLON 11/27/2007  . ABDOMINAL PAIN, LEFT UPPER QUADRANT  11/27/2007  . HIP FRACTURE, LEFT 11/27/2007  . FRACTURE, TIBIA 11/27/2007  . BENIGN PROSTATIC HYPERTROPHY, HX OF 11/27/2007    Past Surgical History:  Procedure Laterality Date  . ANTERIOR CERVICAL DECOMP/DISCECTOMY FUSION N/A 08/27/2014   Procedure: Cervical six-seven anterior cervical decompression fusion with removal of hardware at Cervical five-six;   Surgeon: Erline Levine, MD;  Location: [facility];  Service: Neurosurgery;  Laterality: N/A;  Cervical six-seven anterior cervical decompression fusion with removal of hardware at Cervical five-six  . BACK SURGERY  1992, 2013  . CARDIAC CATHETERIZATION  05/10/2007   Minimal CAD, normal LV systolic function, medical management  . CARDIAC CATHETERIZATION  04/08/2008   RCA ulcerated 70-80% stenosis, stented with a 3x48mm Endeavor stent at 13atm for 50sec, reduced from 80% ulcerated stenosis to 0%.  . CARDIAC CATHETERIZATION  05/07/2009   50% distal left main disease-IVUS or flow wire too dangerous in particular setting to perform intervention.  Marland Kitchen CARDIAC CATHETERIZATION  03/18/2010   Medical management  . CARDIOVASCULAR STRESS TEST  02/09/2012   Normal, no significant wall abnormalities noted  . CARPAL TUNNEL RELEASE Bilateral   . COLONOSCOPY W/ BIOPSIES AND POLYPECTOMY    . CORONARY STENT PLACEMENT    . FEMUR FRACTURE SURGERY Left 1995  . HERNIA REPAIR    . NECK SURGERY  2005  . NEPHRECTOMY Left    secondary to cancer  . TOTAL KNEE ARTHROPLASTY Left 2005  . TOTAL KNEE ARTHROPLASTY Right 04/13/2018   Procedure: RIGHT TOTAL KNEE ARTHROPLASTY;  Surgeon: Netta Cedars, MD;  Location: [facility];  Service: Orthopedics;  Laterality: Right;       Family History  Problem Relation Age of Onset  . Asthma Mother   . Heart disease Mother   . Hypertension Mother   . Esophageal cancer Father   . Barrett's esophagus Father   . Bone cancer Father        mets from esophagus  . Heart disease Father   . Heart disease Paternal Grandfather   . Heart attack Paternal Grandfather 55  . Colon cancer Maternal Uncle   . Diabetes Sister   . Cancer Sister        Cervical cancer  . Stomach cancer Neg Hx     Social History   Tobacco Use  . Smoking status: Never Smoker  . Smokeless tobacco: Never Used  Substance Use Topics  . Alcohol use: Not Currently  . Drug use: No    Home Medications Prior to  Admission medications   Medication Sig Start Date End Date Taking? Authorizing Provider  albuterol (VENTOLIN HFA) 108 (90 Base) MCG/ACT inhaler INHALE 2 PUFFS INTO THE LUNGS EVERY 4 HOURS AS NEEDED FOR WHEEZING 04/09/19   Kozlow, Donnamarie Poag, MD  Alirocumab (PRALUENT) 150 MG/ML SOAJ Inject 150 mg into the skin every 14 (fourteen) days. 06/04/19   Lorretta Harp, MD  amLODipine (NORVASC) 10 MG tablet TK 1 T PO D 06/06/19   [provider]  ARIPiprazole (ABILIFY) 15 MG tablet Take 15 mg by mouth every morning. 09/04/18   [provider]  Armodafinil 250 MG tablet Take 250 mg by mouth daily.  01/22/17   [provider]  aspirin 325 MG tablet Take 325 mg by mouth daily. Taking daily otc    [provider]  DULERA 200-5 MCG/ACT AERO INHALE 2 PUFFS INTO THE LUNGS TWICE DAILY 10/15/19   Garnet Sierras, DO  DULoxetine (CYMBALTA) 60 MG capsule Take 1 capsule (60 mg total) by mouth  2 (two) times daily. 07/16/19   Kathrynn Ducking, MD  ezetimibe (ZETIA) 10 MG tablet Take 1 tablet (10 mg total) by mouth daily. NEED OV. Patient taking differently: Take 10 mg by mouth daily.  07/03/17   Lorretta Harp, MD  finasteride (PROSCAR) 5 MG tablet Take 5 mg by mouth daily.     [provider]  fish oil-omega-3 fatty acids 1000 MG capsule Take 1 g by mouth at bedtime.     [provider]  fluticasone (FLOVENT HFA) 110 MCG/ACT inhaler Inhale 2 puffs into the lungs 2 (two) times daily. 03/14/19   Garnet Sierras, DO  gemfibrozil (LOPID) 600 MG tablet Take 600 mg by mouth 2 (two) times daily.    [provider]  isosorbide mononitrate (IMDUR) 30 MG 24 hr tablet take 1 tablet by mouth once daily Patient taking differently: Take 30 mg by mouth at bedtime 06/21/17   Lorretta Harp, MD  levothyroxine (SYNTHROID, LEVOTHROID) 25 MCG tablet Take 25 mcg by mouth daily before breakfast.     [provider]  Multiple Vitamin (MULTIVITAMIN) tablet Take 1 tablet by mouth at  bedtime.     [provider]  omeprazole (PRILOSEC) 20 MG capsule TAKE 1 CAPSULE BY MOUTH TWICE A DAY AS DIRECTED Patient taking differently: Take 20 mg by mouth at bedtime.  01/29/18   Kozlow, Donnamarie Poag, MD  REPATHA SURECLICK [id number] MG/ML SOAJ Inject 1 mL into the skin every 14 (fourteen) days. 06/07/19   [provider]  silodosin (RAPAFLO) 8 MG CAPS capsule Take 8 mg by mouth at bedtime.     [provider]  vitamin C (ASCORBIC ACID) 500 MG tablet Take 500 mg by mouth 2 (two) times daily.     [provider]  [name] 150 MG injection INJECT 150MG  SUBCUTANEOUSLY EVERY 4 WEEKS (GIVEN AT  PRESCRIBERS OFFICE) 07/12/19   Jiles Prows, MD    Allergies    Contrast media [iodinated diagnostic agents], Nsaids, Penicillins, Sulfonamide derivatives, Codeine, and Statins  Review of Systems   Review of Systems  Constitutional: Positive for activity change, appetite change, chills and fever.  HENT: Positive for congestion and rhinorrhea.   Eyes: Negative for visual disturbance.  Respiratory: Positive for cough, chest tightness and shortness of breath. Negative for apnea.   Cardiovascular: Negative for chest pain.  Gastrointestinal: Negative for abdominal pain, nausea and vomiting.  Genitourinary: Negative for dysuria and hematuria.  Musculoskeletal: Positive for arthralgias and myalgias.  Skin: Negative for rash.  Neurological: Positive for weakness and headaches. Negative for dizziness.   all other systems are negative except as noted in the HPI and PMH.    Physical Exam Updated Vital Signs BP 130/83 (BP Location: Right Arm)   Pulse (!) 108   Temp 98.2 F (36.8 C) (Oral)   Resp 17   Ht 5' 7.5" (1.715 m)   Wt 87.5 kg   SpO2 93%   BMI 29.78 kg/m   Physical Exam Vitals and nursing note reviewed.  Constitutional:      General: He is in acute distress.     Appearance: He is well-developed. He is obese. He is ill-appearing.     Comments: Moderate increased  work of breathing, speaking short phrases  HENT:     Head: Normocephalic and atraumatic.     Mouth/Throat:     Pharynx: No oropharyngeal exudate.  Eyes:     Conjunctiva/sclera: Conjunctivae normal.     Pupils: Pupils are equal, round,  and reactive to light.  Neck:     Comments: No meningismus. Cardiovascular:     Rate and Rhythm: Regular rhythm. Tachycardia present.     Heart sounds: Normal heart sounds. No murmur.  Pulmonary:     Effort: Respiratory distress present.     Breath sounds: Wheezing present.     Comments: moderate increased work of breathing, speaking in short phrases Chest:     Chest wall: No tenderness.  Abdominal:     Palpations: Abdomen is soft.     Tenderness: There is no abdominal tenderness. There is no guarding or rebound.  Musculoskeletal:        General: No tenderness. Normal range of motion.     Cervical back: Normal range of motion and neck supple.     Right lower leg: Edema present.     Left lower leg: Edema present.  Skin:    General: Skin is warm.  Neurological:     Mental Status: He is alert and oriented to person, place, and time.     Cranial Nerves: No cranial nerve deficit.     Motor: No abnormal muscle tone.     Coordination: Coordination normal.     Comments: No ataxia on finger to nose bilaterally. No pronator drift. 5/5 strength throughout. CN 2-12 intact.Equal grip strength. Sensation intact.   Psychiatric:        Behavior: Behavior normal.     ED Results / Procedures / Treatments   Labs (all labs ordered are listed, but only abnormal results are displayed) Labs Reviewed  COMPREHENSIVE METABOLIC PANEL - Abnormal; Notable for the following components:      Result Value   Sodium 133 (*)    CO2 19 (*)    Glucose, Bld 119 (*)    BUN 34 (*)    Creatinine, Ser 2.00 (*)    Calcium 8.6 (*)    Total Protein 8.5 (*)    AST 68 (*)    ALT 60 (*)    GFR calc non Af Amer 33 (*)    GFR calc Af Amer 38 (*)    All other components within  normal limits  LACTIC ACID, PLASMA - Abnormal; Notable for the following components:   Lactic Acid, Venous 2.0 (*)    All other components within normal limits  CBC WITH DIFFERENTIAL/PLATELET - Abnormal; Notable for the following components:   RDW 16.3 (*)    Lymphs Abs 0.5 (*)    All other components within normal limits  D-DIMER, QUANTITATIVE (NOT AT [facility]) - Abnormal; Notable for the following components:   D-Dimer, Quant 1.30 (*)    All other components within normal limits  LACTATE DEHYDROGENASE - Abnormal; Notable for the following components:   LDH 380 (*)    All other components within normal limits  TRIGLYCERIDES - Abnormal; Notable for the following components:   Triglycerides 154 (*)    All other components within normal limits  FIBRINOGEN - Abnormal; Notable for the following components:   Fibrinogen 752 (*)    All other components within normal limits  POC SARS CORONAVIRUS 2 AG -  ED - Abnormal; Notable for the following components:   SARS Coronavirus 2 Ag POSITIVE (*)    All other components within normal limits  CULTURE, BLOOD (ROUTINE X 2)  CULTURE, BLOOD (ROUTINE X 2)  LACTIC ACID, PLASMA  PROTIME-INR  PROCALCITONIN  URINALYSIS, ROUTINE W REFLEX MICROSCOPIC  FERRITIN  C-REACTIVE PROTEIN  CBC WITH DIFFERENTIAL/PLATELET  COMPREHENSIVE METABOLIC PANEL  MAGNESIUM  PHOSPHORUS  ABO/RH    EKG EKG Interpretation  Date/Time:  Saturday November 16 2019 18:38:45 EST Ventricular Rate:  105 PR Interval:    QRS Duration: 75 QT Interval:  396 QTC Calculation: 524 R Axis:   65 Text Interpretation: Sinus tachycardia Borderline T abnormalities, lateral leads Prolonged QT interval No STEMI Confirmed by Nanda Quinton [phone]) on 11/16/2019 6:50:35 PM   Radiology DG Chest Portable 1 View  Result Date: 11/16/2019 CLINICAL DATA:  69 year old male with shortness of breath. EXAM: PORTABLE CHEST 1 VIEW COMPARISON:  Chest radiograph dated 10/08/2018. FINDINGS: Faint bilateral  peripheral, subpleural and perihilar hazy densities most consistent with multifocal pneumonia, likely viral or atypical in etiology including COVID-19. Clinical correlation and follow-up recommended. No pleural effusion or pneumothorax. The cardiac silhouette is within normal limits. No acute osseous pathology. Partially visualized lower cervical ACDF. IMPRESSION: Findings most consistent with multifocal pneumonia. Electronically Signed   By: Anner Crete M.D.   On: 11/16/2019 19:19    Procedures .Critical Care Performed by: Ezequiel Essex, MD Authorized by: Ezequiel Essex, MD   Critical care provider statement:    Critical care time (minutes):  45   Critical care was necessary to treat or prevent imminent or life-threatening deterioration of the following conditions:  Respiratory failure   Critical care was time spent personally by me on the following activities:  Discussions with consultants, evaluation of patient's response to treatment, examination of patient, ordering and performing treatments and interventions, ordering and review of laboratory studies, ordering and review of radiographic studies, pulse oximetry, re-evaluation of patient's condition, obtaining history from patient or surrogate and review of old charts   (including critical care time)  Medications Ordered in ED Medications  remdesivir 100 mg in sodium chloride 0.9 % 100 mL IVPB (has no administration in time range)  heparin injection 5,000 Units (5,000 Units Subcutaneous Given 11/16/19 2246)  0.9 %  sodium chloride infusion (has no administration in time range)  albuterol (VENTOLIN HFA) 108 (90 Base) MCG/ACT inhaler 2 puff (2 puffs Inhalation Given 11/16/19 2247)  dexamethasone (DECADRON) injection 6 mg (6 mg Intravenous Not Given 11/16/19 2235)  guaiFENesin-dextromethorphan (ROBITUSSIN DM) 100-10 MG/5ML syrup 10 mL (has no administration in time range)  acetaminophen (TYLENOL) tablet 650 mg (has no administration in  time range)  ondansetron (ZOFRAN) tablet 4 mg (has no administration in time range)    Or  ondansetron (ZOFRAN) injection 4 mg (has no administration in time range)  ascorbic acid (VITAMIN C) tablet 500 mg (has no administration in time range)  zinc sulfate capsule 220 mg (has no administration in time range)  pantoprazole (PROTONIX) EC tablet 40 mg (has no administration in time range)  dexamethasone (DECADRON) injection 6 mg (6 mg Intravenous Given 11/16/19 2147)  remdesivir 100 mg in sodium chloride 0.9 % 100 mL IVPB (100 mg Intravenous New Bag/Given 11/16/19 2242)  sodium chloride 0.9 % bolus 500 mL (500 mLs Intravenous New Bag/Given (Non-Interop) 11/16/19 2244)    ED Course  I have reviewed the triage vital signs and the nursing notes.  Pertinent labs & imaging results that were available during my care of the patient were reviewed by me and considered in my medical decision making (see chart for details).    MDM Rules/Calculators/A&P                      Difficulty breathing since this morning with body aches and chills for several weeks.  Found to be tachypneic  and hypoxic on arrival.  Concern for coronavirus.  Rapid test is positive.  Albuterol, decadron, remdesivir. CXR with multifocal pneumonia. Labs with AKI, gently hydrate.  New O2 requirement with persistent tachypnea. Will need admission for hypoxic respiratory failure due to COVID19 pneumonia.   D/w Dr. Humphrey Rolls.  Jared [name] was evaluated in Emergency Department on 11/16/2019 for the symptoms described in the history of present illness. He was evaluated in the context of the global COVID-19 pandemic, which necessitated consideration that the patient might be at risk for infection with the SARS-CoV-2 virus that causes COVID-19. Institutional protocols and algorithms that pertain to the evaluation of patients at risk for COVID-19 are in a state of rapid change based on information released by regulatory bodies including  the CDC and federal and state organizations. These policies and algorithms were followed during the patient's care in the ED.  Final Clinical Impression(s) / ED Diagnoses Final diagnoses:  Pneumonia due to COVID-19 virus    Rx / DC Orders ED Discharge Orders    None       Lakyn Mantione, Annie Main, MD 11/16/19 2341

## 2019-11-17 DIAGNOSIS — J1282 Pneumonia due to coronavirus disease 2019: Secondary | ICD-10-CM

## 2019-11-17 DIAGNOSIS — N179 Acute kidney failure, unspecified: Secondary | ICD-10-CM | POA: Diagnosis present

## 2019-11-17 DIAGNOSIS — U071 COVID-19: Principal | ICD-10-CM

## 2019-11-17 LAB — COMPREHENSIVE METABOLIC PANEL
ALT: 51 U/L — ABNORMAL HIGH (ref 0–44)
AST: 51 U/L — ABNORMAL HIGH (ref 15–41)
Albumin: 3.2 g/dL — ABNORMAL LOW (ref 3.5–5.0)
Alkaline Phosphatase: 65 U/L (ref 38–126)
Anion gap: 11 (ref 5–15)
BUN: 31 mg/dL — ABNORMAL HIGH (ref 8–23)
CO2: 18 mmol/L — ABNORMAL LOW (ref 22–32)
Calcium: 8.4 mg/dL — ABNORMAL LOW (ref 8.9–10.3)
Chloride: 109 mmol/L (ref 98–111)
Creatinine, Ser: 1.68 mg/dL — ABNORMAL HIGH (ref 0.61–1.24)
GFR calc Af Amer: 47 mL/min — ABNORMAL LOW (ref >60)
GFR calc non Af Amer: 40 mL/min — ABNORMAL LOW (ref >60)
Glucose, Bld: 149 mg/dL — ABNORMAL HIGH (ref 70–99)
Potassium: 4.1 mmol/L (ref 3.5–5.1)
Sodium: 138 mmol/L (ref 135–145)
Total Bilirubin: 0.7 mg/dL (ref 0.3–1.2)
Total Protein: 7.4 g/dL (ref 6.5–8.1)

## 2019-11-17 LAB — CBC WITH DIFFERENTIAL/PLATELET
Abs Immature Granulocytes: 0.05 10*3/uL (ref 0.00–0.07)
Basophils Absolute: 0 10*3/uL (ref 0.0–0.1)
Basophils Relative: 0 %
Eosinophils Absolute: 0 10*3/uL (ref 0.0–0.5)
Eosinophils Relative: 0 %
HCT: 42 % (ref 39.0–52.0)
Hemoglobin: 13.6 g/dL (ref 13.0–17.0)
Immature Granulocytes: 1 %
Lymphocytes Relative: 8 %
Lymphs Abs: 0.6 10*3/uL — ABNORMAL LOW (ref 0.7–4.0)
MCH: 27.8 pg (ref 26.0–34.0)
MCHC: 32.4 g/dL (ref 30.0–36.0)
MCV: 85.7 fL (ref 80.0–100.0)
Monocytes Absolute: 0.2 10*3/uL (ref 0.1–1.0)
Monocytes Relative: 3 %
Neutro Abs: 5.9 10*3/uL (ref 1.7–7.7)
Neutrophils Relative %: 88 %
Platelets: 244 10*3/uL (ref 150–400)
RBC: 4.9 MIL/uL (ref 4.22–5.81)
RDW: 16.4 % — ABNORMAL HIGH (ref 11.5–15.5)
WBC: 6.8 10*3/uL (ref 4.0–10.5)
nRBC: 0 % (ref 0.0–0.2)

## 2019-11-17 LAB — C-REACTIVE PROTEIN: CRP: 22.3 mg/dL — ABNORMAL HIGH (ref <1.0)

## 2019-11-17 LAB — MAGNESIUM: Magnesium: 2.3 mg/dL (ref 1.7–2.4)

## 2019-11-17 LAB — ABO/RH: ABO/RH(D): A POS

## 2019-11-17 LAB — PHOSPHORUS: Phosphorus: 4.5 mg/dL (ref 2.5–4.6)

## 2019-11-17 LAB — FERRITIN: Ferritin: 495 ng/mL — ABNORMAL HIGH (ref 24–336)

## 2019-11-17 MED ORDER — IPRATROPIUM-ALBUTEROL 0.5-2.5 (3) MG/3ML IN SOLN: 3.0000 mL | Freq: Four times a day (QID) | RESPIRATORY_TRACT | Status: DC | PRN

## 2019-11-17 MED ORDER — HYDROXYZINE HCL 50 MG PO TABS: 150.0000 mg | ORAL_TABLET | Freq: Every day | ORAL | Status: DC

## 2019-11-17 MED ORDER — SUVOREXANT 20 MG PO TABS: 20.0000 mg | ORAL_TABLET | Freq: Every day | ORAL | Status: DC

## 2019-11-17 MED ORDER — TIOTROPIUM BROMIDE MONOHYDRATE 1.25 MCG/ACT IN AERS: 2.0000 | INHALATION_SPRAY | Freq: Every day | RESPIRATORY_TRACT | Status: DC

## 2019-11-17 MED ORDER — ASPIRIN 325 MG PO TABS: 325.0000 mg | ORAL_TABLET | Freq: Every day | ORAL | Status: DC

## 2019-11-17 MED ORDER — MOMETASONE FURO-FORMOTEROL FUM 200-5 MCG/ACT IN AERO
2.0000 | INHALATION_SPRAY | Freq: Two times a day (BID) | RESPIRATORY_TRACT | Status: DC
  Administered 2019-11-18 – 2019-11-20 (×4): 2 via RESPIRATORY_TRACT
  Filled 2019-11-17 (×2): qty 8.8

## 2019-11-17 MED ORDER — LEVOTHYROXINE SODIUM 25 MCG PO TABS
25.0000 ug | ORAL_TABLET | Freq: Every day | ORAL | Status: DC
  Administered 2019-11-18 – 2019-11-20 (×3): 25 ug via ORAL
  Filled 2019-11-17 (×3): qty 1

## 2019-11-17 MED ORDER — UMECLIDINIUM BROMIDE 62.5 MCG/INH IN AEPB
1.0000 | INHALATION_SPRAY | Freq: Every day | RESPIRATORY_TRACT | Status: DC
  Filled 2019-11-17: qty 7

## 2019-11-17 MED ORDER — ISOSORBIDE MONONITRATE ER 30 MG PO TB24
30.0000 mg | ORAL_TABLET | Freq: Every day | ORAL | Status: DC
  Administered 2019-11-17 – 2019-11-20 (×4): 30 mg via ORAL
  Filled 2019-11-17 (×4): qty 1

## 2019-11-17 MED ORDER — EZETIMIBE 10 MG PO TABS
10.0000 mg | ORAL_TABLET | Freq: Every day | ORAL | Status: DC
  Administered 2019-11-17 – 2019-11-20 (×4): 10 mg via ORAL
  Filled 2019-11-17 (×4): qty 1

## 2019-11-17 MED ORDER — GEMFIBROZIL 600 MG PO TABS
600.0000 mg | ORAL_TABLET | Freq: Two times a day (BID) | ORAL | Status: DC
  Administered 2019-11-17 – 2019-11-20 (×7): 600 mg via ORAL
  Filled 2019-11-17 (×7): qty 1

## 2019-11-17 MED ORDER — DULOXETINE HCL 60 MG PO CPEP
60.0000 mg | ORAL_CAPSULE | Freq: Two times a day (BID) | ORAL | Status: DC
  Administered 2019-11-17 – 2019-11-20 (×7): 60 mg via ORAL
  Filled 2019-11-17 (×7): qty 1

## 2019-11-17 MED ORDER — ARMODAFINIL 250 MG PO TABS
250.0000 mg | ORAL_TABLET | Freq: Every day | ORAL | Status: DC
  Administered 2019-11-18 – 2019-11-20 (×3): 250 mg via ORAL

## 2019-11-17 MED ORDER — MOMETASONE FURO-FORMOTEROL FUM 200-5 MCG/ACT IN AERO
2.0000 | INHALATION_SPRAY | Freq: Two times a day (BID) | RESPIRATORY_TRACT | Status: DC
  Filled 2019-11-17: qty 8.8

## 2019-11-17 MED ORDER — FINASTERIDE 5 MG PO TABS
5.0000 mg | ORAL_TABLET | Freq: Every day | ORAL | Status: DC
  Administered 2019-11-18 – 2019-11-20 (×3): 5 mg via ORAL
  Filled 2019-11-17 (×4): qty 1

## 2019-11-17 MED ORDER — UMECLIDINIUM BROMIDE 62.5 MCG/INH IN AEPB
1.0000 | INHALATION_SPRAY | Freq: Every day | RESPIRATORY_TRACT | Status: DC
  Administered 2019-11-18 – 2019-11-20 (×2): 1 via RESPIRATORY_TRACT
  Filled 2019-11-17: qty 7

## 2019-11-17 MED ORDER — ARIPIPRAZOLE 5 MG PO TABS
7.5000 mg | ORAL_TABLET | Freq: Every day | ORAL | Status: DC
  Administered 2019-11-17 – 2019-11-20 (×4): 7.5 mg via ORAL
  Filled 2019-11-17 (×4): qty 2

## 2019-11-17 MED ORDER — PANTOPRAZOLE SODIUM 40 MG PO TBEC: 40.0000 mg | DELAYED_RELEASE_TABLET | Freq: Every day | ORAL | Status: DC

## 2019-11-17 MED ORDER — ASPIRIN EC 325 MG PO TBEC
325.0000 mg | DELAYED_RELEASE_TABLET | Freq: Every day | ORAL | Status: DC
  Administered 2019-11-17 – 2019-11-20 (×4): 325 mg via ORAL
  Filled 2019-11-17 (×4): qty 1

## 2019-11-17 MED ORDER — MONTELUKAST SODIUM 10 MG PO TABS
10.0000 mg | ORAL_TABLET | Freq: Every day | ORAL | Status: DC
  Administered 2019-11-17 – 2019-11-19 (×3): 10 mg via ORAL
  Filled 2019-11-17 (×3): qty 1

## 2019-11-17 NOTE — ED Notes (Signed)
Pt lying in bed awake. Denies any needs. NAD noted. Full monitor on.

## 2019-11-17 NOTE — Progress Notes (Signed)
PROGRESS NOTE  Jared [name]  DOB: 05/05/1951  PCP: Burnard Bunting, MD SK:[id number]  DOA: 11/16/2019 Admitted From: Home  LOS: 1 day   Chief Complaint  Patient presents with  . Shortness of Breath   Brief narrative: Jared [name] is a 69 y.o. male with medical history significant of hypertension, Parkinson disease, asthma and sleep apnea. Patient was brought to the ED from home for several weeks history of malaise, chills as well shortness of breath with cough for less than 24 hours not responsive to inhalers and nebulizers. In the ED, patient was afebrile, hemodynamically stable, O2 sat 89% on room air, improved to more than 90% with 2 L by nasal cannula. Work-up showed WBC count normal at 7.5, creatinine 2, procalcitonin elevated to 2.6, lactic acid less than 2. Rapid COVID-19 antigen test positive. Chest x-ray showed faint bilateral peripheral, subpleural and perihilar hazy densities most consistent with multifocal pneumonia. Patient was admitted to hospital medicine service for further evaluation and management.  Subjective: Patient was seen and examined this morning.  Elderly Caucasian male.  Lying down in bed.  Not in distress.  2 L oxygen by nasal cannula.  Feels better than at presentation  Assessment/Plan: COVID pneumonia Acute respiratory failure with hypoxia 4 L/min -Presented with malaise, shortness of breath, cough -COVID test: Antigen test positive on 2/27. -Chest imaging -multifocal pneumonia -Treatment: Decadron 6 mg daily for 10 days, IV Remdesivir for 5 days to complete on 3/3, -Supportive care: Vitamin C, Zinc, inhalers, Tylenol, Antitussives - benzonatate, Mucinex.  -Protonix while on IV Decadron. -Progression: Patient feels better this morning than at presentation. -Oxygen - SpO2: 92 % O2 Flow Rate (L/min): 4 L/min -Continue airborne/contact isolation precautions. -WBC and inflammatory markers trend as below.  No results found for: [place]  Lab 11/16/19 1945 11/17/19 0358  WBC 7.5 6.8   Recent Labs    11/16/19 1945  DDIMER 1.30*  FERRITIN 495*  LDH 380*  CRP 22.3*   Acute kidney injury  -Creatinine elevated to 2 at presentation, baseline creatinine less than 1.4. -Currently gentle hydration.  Repeat creatinine this morning is better 1.68.  Continue to monitor.  Hypertension -Home meds include Imdur.  Continue to monitor blood pressure.  Parkinson's disease Dementia Depression -Avelify, armodafinil, Cymbalta,  Hyperlipidemia -Home meds include alirocumab (PCSK9 inhibitor), Zetia, gemfibrozil,   BPH -Sildosin, proscar,   Asthma - Xolair, albuterol, Dulera, Flovent, DuoNeb, Spiriva  Hypothyroidism  -Synthroid  GERD -Prilosec  DVT prophylaxis: Heparin Code Status: Full code Consults called:  Admission status: Inpatient/MedSurg  DVT prophylaxis: Heparin Antimicrobials:  IV remdesivir Fluid: None Diet: Cardiac diet  Code Status:  Full code Mobility: Encourage ambulation.  PT eval Family Communication:  Updated wife over the phone at bedside Discharge plan:  Anticipated date: Anticipate completion of remdesivir in the hospital. Disposition: Hopefully home on 3/3 Barriers: Ongoing treatment of COVID-19 pneumonia.  Consultants:  None  Antimicrobials: Anti-infectives (From admission, onward)   Start     Dose/Rate Route Frequency Ordered Stop   11/17/19 1000  remdesivir 100 mg in sodium chloride 0.9 % 100 mL IVPB  Status:  Discontinued     100 mg 200 mL/hr over 30 Minutes Intravenous Daily 11/16/19 2044 11/16/19 2049   11/17/19 1000  remdesivir 100 mg in sodium chloride 0.9 % 100 mL IVPB     100 mg 200 mL/hr over 30 Minutes Intravenous Daily 11/16/19 2053 11/21/19 0959   11/16/19 2100  remdesivir 200 mg in sodium chloride  0.9% 250 mL IVPB  Status:  Discontinued     200 mg 580 mL/hr over 30 Minutes Intravenous Once 11/16/19 2044 11/16/19 2049   11/16/19 2100  remdesivir 100  mg in sodium chloride 0.9 % 100 mL IVPB     100 mg 200 mL/hr over 30 Minutes Intravenous Every 1 hr x 2 11/16/19 2052 11/17/19 0003        Code Status: Full Code   Diet Order            Diet Heart Room service appropriate? Yes; Fluid consistency: Thin  Diet effective now              Infusions:  . sodium chloride 75 mL/hr at 11/17/19 0002  . remdesivir 100 mg in NS 100 mL 100 mg (11/17/19 1058)    Scheduled Meds: . albuterol  2 puff Inhalation Q6H  . ARIPiprazole  7.5 mg Oral Daily  . [START ON 11/18/2019] Armodafinil  250 mg Oral Daily  . vitamin C  500 mg Oral Daily  . aspirin EC  325 mg Oral Daily  . dexamethasone (DECADRON) injection  6 mg Intravenous QHS  . DULoxetine  60 mg Oral BID  . ezetimibe  10 mg Oral Daily  . finasteride  5 mg Oral Daily  . gemfibrozil  600 mg Oral BID  . heparin  5,000 Units Subcutaneous Q8H  . isosorbide mononitrate  30 mg Oral Daily  . [START ON 11/18/2019] levothyroxine  25 mcg Oral QAC breakfast  . mometasone-formoterol  2 puff Inhalation BID  . montelukast  10 mg Oral QHS  . pantoprazole  40 mg Oral Daily  . [START ON 11/18/2019] umeclidinium bromide  1 puff Inhalation Daily  . zinc sulfate  220 mg Oral Daily    PRN meds: acetaminophen, guaiFENesin-dextromethorphan, ipratropium-albuterol, ondansetron **OR** ondansetron (ZOFRAN) IV   Objective: Vitals:   11/17/19 0029 11/17/19 0639  BP: 125/71 129/73  Pulse: 82 69  Resp: 19 18  Temp: 99.3 F (37.4 C) 97.7 F (36.5 C)  SpO2: 97% 92%    Intake/Output Summary (Last 24 hours) at 11/17/2019 1152 Last data filed at 11/17/2019 1058 Gross per 24 hour  Intake 1402.57 ml  Output --  Net 1402.57 ml   Filed Weights   11/16/19 1823  Weight: 87.5 kg   Weight change:  Body mass index is 29.78 kg/m.   Physical Exam: General exam: Appears calm and comfortable.  Skin: No rashes, lesions or ulcers. HEENT: Atraumatic, normocephalic, supple neck, no obvious bleeding Lungs: Clear to  auscultation bilaterally CVS: Regular rate and rhythm, no murmur GI/Abd soft, nontender, nondistended, bowel sound present CNS: Alert, awake, demented at baseline, cheerful. Psychiatry: Mood appropriate Extremities: No pedal edema, no calf tenderness  Data Review: I have personally reviewed the laboratory data and studies available.  Recent Labs  Lab 11/16/19 1945 11/17/19 0358  WBC 7.5 6.8  NEUTROABS 6.3 5.9  HGB 14.7 13.6  HCT 45.5 42.0  MCV 84.4 85.7  PLT 270 244   Recent Labs  Lab 11/16/19 1945 11/17/19 0358  NA 133* 138  K 4.0 4.1  CL 102 109  CO2 19* 18*  GLUCOSE 119* 149*  BUN 34* 31*  CREATININE 2.00* 1.68*  CALCIUM 8.6* 8.4*  MG  --  2.3  PHOS  --  4.5    Signed, Terrilee Croak, MD [facility] Pager: [phone] (Secure Chat preferred). 11/17/2019

## 2019-11-17 NOTE — H&P (Signed)
History and Physical    Jared [name] [id number] DOB: Jan 18, 1951 DOA: 11/16/2019  PCP: Burnard Bunting, MD (Confirm with patient/family/NH records and if not entered, this has to be entered at [facility] point of entry) Patient coming from: home  I have personally briefly reviewed patient's old medical records in [place]  Chief Complaint: Shortness of breath  HPI: Jared [name] is a 69 y.o. male with medical history significant of hypertension, Parkinson disease, asthma and sleep apnea presented to ED for evaluation of worsening shortness of breath.  Patient states that he started having difficulty with breathing since this morning that continue to worsen and he was in so much respiratory distress with cough that he came to the ED for further evaluation.  Patient states that he used his inhalers at home but no improvement in symptoms.  Patient is also complaining of body ache is and chills for several weeks but denies fever, sore throat, loss of taste and smell sensation, chest pain, nausea, vomiting, abdominal pain and urinary symptoms.  Patient further mentioned that he had his first coronavirus vaccine and waiting for the second 1 and also denies any known exposure to sick individuals with coronavirus.  ED Course: On arrival to the ED patient had temperature of 98.2, blood pressure 130/83, heart rate 108, respiratory rate 17 and oxygen saturation was 89% on room air and started on 2 L of oxygen with nasal cannula.  Blood work showed WBC of 7.5, creatinine 2, pro calcitonin 2.6 and lactic acid less than 2.  Chest x-ray showed multifocal pneumonia.  Rapid COVID-19 test positive.  Patient was started on gentle hydration for acute kidney injury and also started on albuterol, Decadron and remdesivir for COVID-19 infection.  Review of Systems: As per HPI otherwise 10 point review of systems negative.   Past Medical History:  Diagnosis Date  . Cervical vertebral fusion 05/02/2014   Now  presenting with C5-6 radiculopathy  . Coronary artery disease   . Depression   . Depression, prolonged 06/26/2013  . Diverticulosis   . GERD (gastroesophageal reflux disease)   . Heart attack ([place]) 2009  . History of esophageal stricture   . Hyperlipidemia   . Hypertension   . IBS (irritable bowel syndrome)   . Internal hemorrhoids   . Osteoarthritis   . 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 Peripheral neuropathy   . Renal cell carcinoma 1997   Left kidney  . 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[place]) 07/16/2019  . Shoulder pain, right   . Sleep apnea    wears CPAP  . Sleep talking   . Snoring 12/25/2013  . Wears glasses     Past Surgical History:  Procedure Laterality Date  . ANTERIOR CERVICAL DECOMP/DISCECTOMY FUSION N/A 08/27/2014   Procedure: Cervical six-seven anterior cervical decompression fusion with removal of hardware at Cervical five-six;  Surgeon: Erline Levine, MD;  Location: [facility];  Service: Neurosurgery;  Laterality: N/A;  Cervical six-seven anterior cervical decompression fusion with removal of hardware at Cervical five-six  . BACK SURGERY  1992, 2013  . CARDIAC CATHETERIZATION  05/10/2007   Minimal CAD, normal LV systolic function, medical management  . CARDIAC CATHETERIZATION  04/08/2008   RCA ulcerated 70-80% stenosis, stented with a 3x2mm Endeavor stent at 13atm for 50sec, reduced from 80% ulcerated stenosis to 0%.  . CARDIAC CATHETERIZATION  05/07/2009   50% distal left main disease-IVUS or flow wire too dangerous in particular setting to perform intervention.  Marland Kitchen CARDIAC CATHETERIZATION  03/18/2010   Medical management  . CARDIOVASCULAR STRESS TEST  02/09/2012   Normal, no significant wall abnormalities noted  . CARPAL TUNNEL RELEASE Bilateral   . COLONOSCOPY W/ BIOPSIES AND POLYPECTOMY    . CORONARY STENT PLACEMENT    . FEMUR FRACTURE SURGERY Left 1995  . HERNIA REPAIR    . NECK SURGERY  2005  . NEPHRECTOMY Left    secondary to cancer  . TOTAL  KNEE ARTHROPLASTY Left 2005  . TOTAL KNEE ARTHROPLASTY Right 04/13/2018   Procedure: RIGHT TOTAL KNEE ARTHROPLASTY;  Surgeon: Netta Cedars, MD;  Location: [facility];  Service: Orthopedics;  Laterality: Right;     reports that he has never smoked. He has never used smokeless tobacco. He reports previous alcohol use. He reports that he does not use drugs.  Allergies  Allergen Reactions  . 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 Penicillins Hives, Itching and Other (See Comments)    Has patient had a PCN reaction causing immediate rash, facial/tongue/throat swelling, SOB or lightheadedness with hypotension: Unknown Has patient had a PCN reaction causing severe rash involving mucus membranes or skin necrosis: Unknown Has patient had a PCN reaction that required hospitalization: Unknown Has patient had a PCN reaction occurring within the last 10 years: No If all of the above answers are "NO", then may proceed with Cephalosporin use.   . Sulfonamide Derivatives Hives and Itching  . Codeine Nausea Only  . Statins Other (See Comments)    Myalgias    Family History  Problem Relation Age of Onset  . Asthma Mother   . Heart disease Mother   . Hypertension Mother   . Esophageal cancer Father   . Barrett's esophagus Father   . Bone cancer Father        mets from esophagus  . Heart disease Father   . Heart disease Paternal Grandfather   . Heart attack Paternal Grandfather 21  . Colon cancer Maternal Uncle   . Diabetes Sister   . Cancer Sister        Cervical cancer  . Stomach cancer Neg Hx     Prior to Admission medications   Medication Sig Start Date End Date Taking? Authorizing Provider  albuterol (VENTOLIN HFA) 108 (90 Base) MCG/ACT inhaler INHALE 2 PUFFS INTO THE LUNGS EVERY 4 HOURS AS NEEDED FOR WHEEZING Patient taking  differently: Inhale 2 puffs into the lungs every 4 (four) hours as needed for wheezing.  04/09/19  Yes Kozlow, Donnamarie Poag, MD  Alirocumab (PRALUENT) 150 MG/ML SOAJ Inject 150 mg into the skin every 14 (fourteen) days. 06/04/19  Yes Lorretta Harp, MD  ARIPiprazole (ABILIFY) 15 MG tablet Take 7.5 mg by mouth every morning.  09/04/18  Yes [provider]  Armodafinil 250 MG tablet Take 250 mg by mouth daily.  01/22/17  Yes [provider]  aspirin 325 MG tablet Take 325 mg by mouth daily. Taking daily otc   Yes [provider]  DULERA 200-5 MCG/ACT AERO INHALE 2 PUFFS INTO THE LUNGS TWICE DAILY Patient taking differently: Inhale 2 puffs into the lungs in the morning and at bedtime.  10/15/19  Yes Garnet Sierras, DO  DULoxetine (CYMBALTA) 60 MG capsule Take 1 capsule (60 mg total) by mouth 2 (two) times daily. 07/16/19  Yes Kathrynn Ducking, MD  ezetimibe (ZETIA) 10 MG tablet Take 1 tablet (10 mg total) by mouth daily. NEED OV. Patient taking differently: Take 10 mg by mouth daily.  07/03/17  Yes Lorretta Harp, MD  finasteride (PROSCAR) 5 MG tablet Take 5 mg by mouth daily.    Yes [provider]  fish oil-omega-3 fatty acids 1000 MG capsule Take 1 g by mouth at bedtime.    Yes [provider]  fluticasone (FLOVENT HFA) 110 MCG/ACT inhaler Inhale 2 puffs into the lungs 2 (two) times daily. 03/14/19  Yes Garnet Sierras, DO  gemfibrozil (LOPID) 600 MG tablet Take 600 mg by mouth 2 (two) times daily.   Yes [provider]  hydrOXYzine (ATARAX/VISTARIL) 50 MG tablet Take 150 mg by mouth at bedtime.   Yes [provider]  ipratropium-albuterol (DUONEB) 0.5-2.5 (3) MG/3ML SOLN Take 3 mLs by nebulization every 6 (six) hours as needed (wheezing, coughing, shortness of breath).   Yes [provider]  isosorbide mononitrate (IMDUR) 30 MG 24 hr tablet take 1 tablet by mouth once daily Patient taking differently: Take 30 mg by mouth daily.  06/21/17   Yes Lorretta Harp, MD  levothyroxine (SYNTHROID, LEVOTHROID) 25 MCG tablet Take 25 mcg by mouth daily before breakfast.    Yes [provider]  montelukast (SINGULAIR) 10 MG tablet Take 10 mg by mouth at bedtime.   Yes [provider]  Multiple Vitamin (MULTIVITAMIN) tablet Take 1 tablet by mouth at bedtime.    Yes [provider]  mupirocin cream (BACTROBAN) 2 % Apply 1 application topically 2 (two) times daily.   Yes [provider]  nitroGLYCERIN (NITROSTAT) 0.4 MG SL tablet Place 0.4 mg under the tongue every 5 (five) minutes as needed for chest pain.   Yes [provider]  omeprazole (PRILOSEC) 20 MG capsule TAKE 1 CAPSULE BY MOUTH TWICE A DAY AS DIRECTED Patient taking differently: Take 20 mg by mouth in the morning and at bedtime.  01/29/18  Yes Kozlow, Donnamarie Poag, MD  silodosin (RAPAFLO) 8 MG CAPS capsule Take 8 mg by mouth at bedtime.    Yes [provider]  Suvorexant (BELSOMRA) 20 MG TABS Take 20 mg by mouth at bedtime.   Yes [provider]  Tiotropium Bromide Monohydrate (SPIRIVA RESPIMAT) 1.25 MCG/ACT AERS Inhale 2 puffs into the lungs daily.   Yes [provider]  vitamin C (ASCORBIC ACID) 500 MG tablet Take 500 mg by mouth 2 (two) times daily.    Yes [provider]  XOLAIR 150 MG injection INJECT 150MG  SUBCUTANEOUSLY EVERY 4 WEEKS (GIVEN AT  PRESCRIBERS OFFICE) Patient taking differently: Inject 150 mg into the skin every 14 (fourteen) days. Given at prescriber's office 07/12/19  Yes Kozlow, Donnamarie Poag, MD  terbinafine (LAMISIL) 250 MG tablet Take 250 mg by mouth daily.    [provider]    Physical Exam: Vitals:   11/16/19 2200 11/16/19 2230 11/16/19 2300 11/17/19 0029  BP: (!) 152/74 (!) 157/74 (!) 146/64 125/71  Pulse: 96 95 88 82  Resp: (!) 36 (!) 39 (!) 36 19  Temp:    99.3 F (37.4 C)  TempSrc:    Oral  SpO2: 96% 93% 93% 97%  Weight:      Height:        Constitutional: NAD,  calm, comfortable Vitals:   11/16/19 2200 11/16/19 2230 11/16/19 2300 11/17/19 0029  BP: (!) 152/74 (!) 157/74 (!) 146/64 125/71  Pulse:  96 95 88 82  Resp: (!) 36 (!) 39 (!) 36 19  Temp:    99.3 F (37.4 C)  TempSrc:    Oral  SpO2: 96% 93% 93% 97%  Weight:      Height:       General: Patient is a 69 year old Caucasian male on 2 L of oxygen with nasal cannula, not in acute distress. Eyes: PERRL, lids and conjunctivae normal ENMT: Mucous membranes are moist. Posterior pharynx clear of any exudate or lesions.Normal dentition.  Neck: normal, supple, no masses, no thyromegaly Respiratory: Patient is on 2 L of oxygen with nasal cannula.  Diminished breath sounds in bilateral lung bases.  No wheezes rales or rhonchi appreciated on auscultation. Normal respiratory effort. No accessory muscle use.  Cardiovascular: Regular rate and rhythm, no murmurs / rubs / gallops. No extremity edema. 2+ pedal pulses. No carotid bruits.  Abdomen: no tenderness, no masses palpated. No hepatosplenomegaly. Bowel sounds positive.  Musculoskeletal: no clubbing / cyanosis. No joint deformity upper and lower extremities. Good ROM, no contractures. Normal muscle tone.  Skin: no rashes, lesions, ulcers. No induration Neurologic: CN 2-12 grossly intact. Sensation intact, DTR normal. Strength 5/5 in all 4.  Psychiatric: Normal judgment and insight. Alert and oriented x 3. Normal mood.    Labs on Admission: I have personally reviewed following labs and imaging studies  CBC: Recent Labs  Lab 11/16/19 1945  WBC 7.5  NEUTROABS 6.3  HGB 14.7  HCT 45.5  MCV 84.4  PLT [id number]   Basic Metabolic Panel: Recent Labs  Lab 11/16/19 1945  NA 133*  K 4.0  CL 102  CO2 19*  GLUCOSE 119*  BUN 34*  CREATININE 2.00*  CALCIUM 8.6*   GFR: Estimated Creatinine Clearance: 36.1 mL/min (A) (by C-G formula based on SCr of 2 mg/dL (H)). Liver Function Tests: Recent Labs  Lab 11/16/19 1945  AST 68*  ALT 60*  ALKPHOS 72   BILITOT 0.7  PROT 8.5*  ALBUMIN 3.7   No results for input(s): LIPASE, AMYLASE in the last 168 hours. No results for input(s): AMMONIA in the last 168 hours. Coagulation Profile: Recent Labs  Lab 11/16/19 1945  INR 0.9   Cardiac Enzymes: No results for input(s): CKTOTAL, CKMB, CKMBINDEX, TROPONINI in the last 168 hours. BNP (last 3 results) No results for input(s): PROBNP in the last 8760 hours. HbA1C: No results for input(s): HGBA1C in the last 72 hours. CBG: No results for input(s): GLUCAP in the last 168 hours. Lipid Profile: Recent Labs    11/16/19 1945  TRIG 154*   Thyroid Function Tests: No results for input(s): TSH, T4TOTAL, FREET4, T3FREE, THYROIDAB in the last 72 hours. Anemia Panel: Recent Labs    11/16/19 1945  FERRITIN 495*   Urine analysis:    Component Value Date/Time   COLORURINE YELLOW 10/08/2018 [place] 10/08/2018 1159   LABSPEC 1.015 10/08/2018 1159   PHURINE 5.0 10/08/2018 1159   GLUCOSEU NEGATIVE 10/08/2018 1159   HGBUR NEGATIVE 10/08/2018 1159   [place] 10/08/2018 1159   [place] 10/08/2018 1159   PROTEINUR NEGATIVE 10/08/2018 1159   UROBILINOGEN 0.2 03/17/2010 1452   NITRITE NEGATIVE 10/08/2018 1159   LEUKOCYTESUR NEGATIVE 10/08/2018 1159    Radiological Exams on Admission: DG Chest Portable 1 View  Result Date: 11/16/2019 CLINICAL DATA:  69 year old male with shortness of breath. EXAM: PORTABLE CHEST 1 VIEW COMPARISON:  Chest radiograph dated 10/08/2018. FINDINGS: Faint bilateral peripheral, subpleural and perihilar hazy densities most consistent  with multifocal pneumonia, likely viral or atypical in etiology including COVID-19. Clinical correlation and follow-up recommended. No pleural effusion or pneumothorax. The cardiac silhouette is within normal limits. No acute osseous pathology. Partially visualized lower cervical ACDF. IMPRESSION: Findings most consistent with multifocal pneumonia.  Electronically Signed   By: Anner Crete M.D.   On: 11/16/2019 19:19      Assessment/Plan Principal Problem:    Pneumonia due to COVID-19 virus  Continue oxygen supplementation with nasal cannula maintain oxygen saturation above 90%. Continue remdesivir. IV Decadron 6 mg daily Vitamin C and zinc supplementation Serum Robitussin as needed for cough Protonix 40 mg p.o. daily for GI prophylaxis Contact and droplet precautions in place  Active Problems:    AKI (acute kidney injury) ([place]) Continue gentle hydration with normal saline. Continue to monitor BMP Avoid nephrotoxins   DVT prophylaxis: Heparin Code Status: Full code Consults called:  Admission status: Inpatient/MedSurg   Edmonia Lynch MD [facility] Pager [phone]   If 7PM-7AM, please contact night-coverage [email or web address] Password   11/17/2019, 3:11 AM

## 2019-11-17 NOTE — Plan of Care (Signed)
Progressing forward with no SOB - Safety Measures in place

## 2019-11-17 NOTE — Plan of Care (Signed)
  Problem: Education: Goal: Knowledge of risk factors and measures for prevention of condition will improve Outcome: Progressing   Problem: Coping: Goal: Psychosocial and spiritual needs will be supported Outcome: Progressing   Problem: Respiratory: Goal: Will maintain a patent airway Outcome: Progressing Goal: Complications related to the disease process, condition or treatment will be avoided or minimized Outcome: Progressing   

## 2019-11-18 ENCOUNTER — Telehealth: Payer: Self-pay

## 2019-11-18 LAB — CBC WITH DIFFERENTIAL/PLATELET
Abs Immature Granulocytes: 0.13 10*3/uL — ABNORMAL HIGH (ref 0.00–0.07)
Basophils Absolute: 0 10*3/uL (ref 0.0–0.1)
Basophils Relative: 0 %
Eosinophils Absolute: 0 10*3/uL (ref 0.0–0.5)
Eosinophils Relative: 0 %
HCT: 38.5 % — ABNORMAL LOW (ref 39.0–52.0)
Hemoglobin: 12.5 g/dL — ABNORMAL LOW (ref 13.0–17.0)
Immature Granulocytes: 1 %
Lymphocytes Relative: 6 %
Lymphs Abs: 0.6 10*3/uL — ABNORMAL LOW (ref 0.7–4.0)
MCH: 27.8 pg (ref 26.0–34.0)
MCHC: 32.5 g/dL (ref 30.0–36.0)
MCV: 85.6 fL (ref 80.0–100.0)
Monocytes Absolute: 0.5 10*3/uL (ref 0.1–1.0)
Monocytes Relative: 5 %
Neutro Abs: 8.4 10*3/uL — ABNORMAL HIGH (ref 1.7–7.7)
Neutrophils Relative %: 88 %
Platelets: 276 10*3/uL (ref 150–400)
RBC: 4.5 MIL/uL (ref 4.22–5.81)
RDW: 16.8 % — ABNORMAL HIGH (ref 11.5–15.5)
WBC: 9.5 10*3/uL (ref 4.0–10.5)
nRBC: 0 % (ref 0.0–0.2)

## 2019-11-18 LAB — COMPREHENSIVE METABOLIC PANEL
ALT: 46 U/L — ABNORMAL HIGH (ref 0–44)
AST: 49 U/L — ABNORMAL HIGH (ref 15–41)
Albumin: 2.8 g/dL — ABNORMAL LOW (ref 3.5–5.0)
Alkaline Phosphatase: 56 U/L (ref 38–126)
Anion gap: 8 (ref 5–15)
BUN: 34 mg/dL — ABNORMAL HIGH (ref 8–23)
CO2: 17 mmol/L — ABNORMAL LOW (ref 22–32)
Calcium: 8.2 mg/dL — ABNORMAL LOW (ref 8.9–10.3)
Chloride: 114 mmol/L — ABNORMAL HIGH (ref 98–111)
Creatinine, Ser: 1.39 mg/dL — ABNORMAL HIGH (ref 0.61–1.24)
GFR calc Af Amer: 59 mL/min — ABNORMAL LOW (ref >60)
GFR calc non Af Amer: 51 mL/min — ABNORMAL LOW (ref >60)
Glucose, Bld: 163 mg/dL — ABNORMAL HIGH (ref 70–99)
Potassium: 4.2 mmol/L (ref 3.5–5.1)
Sodium: 139 mmol/L (ref 135–145)
Total Bilirubin: 0.7 mg/dL (ref 0.3–1.2)
Total Protein: 6.8 g/dL (ref 6.5–8.1)

## 2019-11-18 LAB — D-DIMER, QUANTITATIVE: D-Dimer, Quant: 0.68 ug/mL-FEU — ABNORMAL HIGH (ref 0.00–0.50)

## 2019-11-18 LAB — FERRITIN: Ferritin: 469 ng/mL — ABNORMAL HIGH (ref 24–336)

## 2019-11-18 LAB — C-REACTIVE PROTEIN: CRP: 13.9 mg/dL — ABNORMAL HIGH (ref <1.0)

## 2019-11-18 NOTE — Plan of Care (Signed)
Pt alert and oriented x 4 able to express needs denies pain. Currently on 5L O2 via [place] sats 92% no acute distress noted. Standby assist in room for mobility continent of bowel and bladder uses urinal LBM this AM. Appetite good consumed 100% breakfast able to feed self. No skinissues. Continues IV ABTs. Call bell within reach  will continue to monitor

## 2019-11-18 NOTE — Progress Notes (Signed)
PROGRESS NOTE  Jared [name]  DOB: 1950-12-02  PCP: Burnard Bunting, MD [id number]  DOA: 11/16/2019 Admitted From: Home  LOS: 2 days   Chief Complaint  Patient presents with  . Shortness of Breath   Brief narrative: Jared [name] is a 69 y.o. male with medical history significant of hypertension, Parkinson disease, asthma and sleep apnea. Patient was brought to the ED from home for several weeks history of malaise, chills as well shortness of breath with cough for less than 24 hours not responsive to inhalers and nebulizers. In the ED, patient was afebrile, hemodynamically stable, O2 sat 89% on room air, improved to more than 90% with 2 L by nasal cannula. Work-up showed WBC count normal at 7.5, creatinine 2, procalcitonin elevated to 2.6, lactic acid less than 2. Rapid COVID-19 antigen test positive. Chest x-ray showed faint bilateral peripheral, subpleural and perihilar hazy densities most consistent with multifocal pneumonia. Patient was admitted to hospital medicine service for further evaluation and management.  Subjective: Patient was seen and examined this morning.  Elderly Caucasian male. Sitting up at the edge of the bed. Not in distress. Remains on 4-5 lpm O2. Feels better than at presentation.   Assessment/Plan: COVID pneumonia Acute respiratory failure with hypoxia 4 L/min -Presented with malaise, shortness of breath, cough -COVID test: Antigen test positive on 2/27. -Chest imaging -multifocal pneumonia -Treatment: Decadron 6 mg daily for 10 days, IV Remdesivir for 5 days to complete on 3/3, -Supportive care: Vitamin C, Zinc, inhalers, Tylenol, Antitussives - benzonatate, Mucinex.  -Protonix while on IV Decadron. -Progression: Patient feels better this morning than at presentation.  Inflammatory markers improving.  But patient is still on high flow oxygen. -Oxygen - SpO2: 94 % O2 Flow Rate (L/min): 5 L/min -Continue airborne/contact isolation  precautions. -WBC and inflammatory markers trend as below.  No results found for: [place]  Lab 11/16/19 1945 11/17/19 0358 11/18/19 0334  WBC 7.5 6.8 9.5   Recent Labs    11/16/19 1945 11/18/19 0334  DDIMER 1.30* 0.68*  FERRITIN 495* 469*  LDH 380*  --   CRP 22.3* 13.9*   Acute kidney injury  -Creatinine elevated to 2 at presentation, baseline creatinine less than 1.4. -Improved creatinine with IV hydration.  Creatinine back to baseline today.  We will stop IV fluid today.  Hypertension -Continue Imdur.  Continue to monitor blood pressure.  Parkinson's disease Dementia Depression -Abilify, armodafinil, Cymbalta,  Hyperlipidemia -Home meds include alirocumab (PCSK9 inhibitor), Zetia, gemfibrozil.  Continue all  BPH -Continue Sildosin, proscar,   Asthma - Xolair, albuterol, Dulera, Flovent, DuoNeb, Spiriva  Hypothyroidism  -Synthroid  GERD -Prilosec  DVT prophylaxis: Heparin Antimicrobials:  IV remdesivir Fluid: None Diet: Cardiac diet  Code Status:  Full code Mobility: Encourage ambulation.  PT eval Family Communication:  Updated wife over the phone at bedside Discharge plan:  Disposition: Hopefully home on 3/3 after completion of remdesivir Barriers: Ongoing treatment of COVID-19 pneumonia.  Consultants:  None  Antimicrobials: Anti-infectives (From admission, onward)   Start     Dose/Rate Route Frequency Ordered Stop   11/17/19 1000  remdesivir 100 mg in sodium chloride 0.9 % 100 mL IVPB  Status:  Discontinued     100 mg 200 mL/hr over 30 Minutes Intravenous Daily 11/16/19 2044 11/16/19 2049   11/17/19 1000  remdesivir 100 mg in sodium chloride 0.9 % 100 mL IVPB     100 mg 200 mL/hr over 30 Minutes Intravenous Daily 11/16/19 2053 11/21/19 0959  11/16/19 2100  remdesivir 200 mg in sodium chloride 0.9% 250 mL IVPB  Status:  Discontinued     200 mg 580 mL/hr over 30 Minutes Intravenous Once 11/16/19 2044 11/16/19 2049    11/16/19 2100  remdesivir 100 mg in sodium chloride 0.9 % 100 mL IVPB     100 mg 200 mL/hr over 30 Minutes Intravenous Every 1 hr x 2 11/16/19 2052 11/17/19 0003        Code Status: Full Code   Diet Order            Diet Heart Room service appropriate? Yes; Fluid consistency: Thin  Diet effective now              Infusions:  . remdesivir 100 mg in NS 100 mL 100 mg (11/18/19 0908)    Scheduled Meds: . albuterol  2 puff Inhalation Q6H  . ARIPiprazole  7.5 mg Oral Daily  . Armodafinil  250 mg Oral Daily  . vitamin C  500 mg Oral Daily  . aspirin EC  325 mg Oral Daily  . dexamethasone (DECADRON) injection  6 mg Intravenous QHS  . DULoxetine  60 mg Oral BID  . ezetimibe  10 mg Oral Daily  . finasteride  5 mg Oral Daily  . gemfibrozil  600 mg Oral BID  . heparin  5,000 Units Subcutaneous Q8H  . isosorbide mononitrate  30 mg Oral Daily  . levothyroxine  25 mcg Oral QAC breakfast  . mometasone-formoterol  2 puff Inhalation BID  . montelukast  10 mg Oral QHS  . pantoprazole  40 mg Oral Daily  . umeclidinium bromide  1 puff Inhalation Daily  . zinc sulfate  220 mg Oral Daily    PRN meds: acetaminophen, guaiFENesin-dextromethorphan, ipratropium-albuterol, ondansetron **OR** ondansetron (ZOFRAN) IV   Objective: Vitals:   11/18/19 0519 11/18/19 1303  BP: 133/77 132/74  Pulse: 70 78  Resp: 18 18  Temp: (!) 97.3 F (36.3 C) (!) 97.2 F (36.2 C)  SpO2: 94% 94%    Intake/Output Summary (Last 24 hours) at 11/18/2019 1640 Last data filed at 11/18/2019 0300 Gross per 24 hour  Intake 1007.25 ml  Output -  Net 1007.25 ml   Filed Weights   11/16/19 1823  Weight: 87.5 kg   Weight change:  Body mass index is 29.78 kg/m.   Physical Exam: General exam: Appears calm and comfortable.  Skin: No rashes, lesions or ulcers. HEENT: Atraumatic, normocephalic, supple neck, no obvious bleeding Lungs: Clear to auscultation bilaterally.  No wheezing, no crackles CVS: Regular rate  and rhythm, no murmur GI/Abd soft, nontender, nondistended, bowel sound present CNS: Alert, awake, demented at baseline, cheerful. Psychiatry: Mood appropriate Extremities: No pedal edema, no calf tenderness  Data Review: I have personally reviewed the laboratory data and studies available.  Recent Labs  Lab 11/16/19 1945 11/17/19 0358 11/18/19 0334  WBC 7.5 6.8 9.5  NEUTROABS 6.3 5.9 8.4*  HGB 14.7 13.6 12.5*  HCT 45.5 42.0 38.5*  MCV 84.4 85.7 85.6  PLT 270 244 276   Recent Labs  Lab 11/16/19 1945 11/17/19 0358 11/18/19 0334  NA 133* 138 139  K 4.0 4.1 4.2  CL 102 109 114*  CO2 19* 18* 17*  GLUCOSE 119* 149* 163*  BUN 34* 31* 34*  CREATININE 2.00* 1.68* 1.39*  CALCIUM 8.6* 8.4* 8.2*  MG  --  2.3  --   PHOS  --  4.5  --     Signed, Terrilee Croak, MD [facility]  Pager: [phone] (Secure Chat preferred). 11/18/2019

## 2019-11-18 NOTE — Evaluation (Signed)
Physical Therapy Evaluation Patient Details Name: Jared [name] MRN: [id number] DOB: 10-Jan-1951 Today's Date: 11/18/2019   History of Present Illness  Pt is a 69 y.o. male with medical history significant of hypertension, Parkinson disease, asthma and sleep apnea. Patient was brought to the ED from home for several weeks history of malaise, chills as well shortness of breath with cough.  Pt admitted with COVID PNE/resp failure and AKI.  Clinical Impression   Pt admitted with above diagnosis. Overall, pt did well with PT.  He was able to ambulate 200' without AD, stable O2 sats on RA, and no dyspnea.  Had mild deficits in activity tolerance and balance compared to baseline.  Pt currently with functional limitations due to the deficits listed below (see PT Problem List). Pt will benefit from skilled PT to increase their independence and safety with mobility to allow discharge to the venue listed below.       Follow Up Recommendations No PT follow up    Equipment Recommendations  None recommended by PT    Recommendations for Other Services       Precautions / Restrictions Precautions Precautions: None      Mobility  Bed Mobility Overal bed mobility: Needs Assistance Bed Mobility: Supine to Sit;Sit to Supine     Supine to sit: Supervision Sit to supine: Supervision      Transfers Overall transfer level: Needs assistance Equipment used: None Transfers: Sit to/from Stand Sit to Stand: Min guard            Ambulation/Gait Ambulation/Gait assistance: Min guard Gait Distance (Feet): 200 Feet Assistive device: None Gait Pattern/deviations: Decreased stride length     General Gait Details: ambulated in room ; able to navigate around tight areas without LOB; mild unsteadiness and decreased gait speed;  pt was able to carry on a conversation and walk without dyspnea  Stairs            Wheelchair Mobility    Modified Rankin (Stroke Patients Only)        Balance Overall balance assessment: Needs assistance Sitting-balance support: Feet supported;No upper extremity supported Sitting balance-Leahy Scale: Normal     Standing balance support: No upper extremity supported;During functional activity Standing balance-Leahy Scale: Good                               Pertinent Vitals/Pain Pain Assessment: No/denies pain    Home Living Family/patient expects to be discharged to:: Private residence Living Arrangements: Spouse/significant other Available Help at Discharge: Family;Available 24 hours/day Type of Home: House Home Access: Stairs to enter Entrance Stairs-Rails: Left Entrance Stairs-Number of Steps: 2 Home Layout: One level Home Equipment: Grab bars - tub/shower;Shower seat;Cane - single point Additional Comments: Has DME from prior surgeries, but unsure if still has it all or where it is located    Prior Function Level of Independence: Independent               Hand Dominance        Extremity/Trunk Assessment   Upper Extremity Assessment Upper Extremity Assessment: Overall WFL for tasks assessed    Lower Extremity Assessment Lower Extremity Assessment: Overall WFL for tasks assessed    Cervical / Trunk Assessment Cervical / Trunk Assessment: Normal  Communication   Communication: No difficulties  Cognition Arousal/Alertness: Awake/alert Behavior During Therapy: WFL for tasks assessed/performed Overall Cognitive Status: Within Functional Limits for tasks assessed  General Comments General comments (skin integrity, edema, etc.): Pt was on 4 LPM O2 at arrival with sats 98%.  REports he has been walking to bathroom with staff without O2.  Ambulated in room on RA and sats 94-95%.  Noted if pt moving hand, poor SPO2 signal but when good waveform sats at least 94%.  Notified RN and left pt on RA.    Exercises     Assessment/Plan    PT  Assessment Patient needs continued PT services  PT Problem List Decreased mobility;Decreased activity tolerance;Decreased balance;Cardiopulmonary status limiting activity       PT Treatment Interventions DME instruction;Therapeutic activities;Gait training;Therapeutic exercise;Patient/family education;Stair training;Balance training;Functional mobility training    PT Goals (Current goals can be found in the Care Plan section)  Acute Rehab PT Goals Patient Stated Goal: return home PT Goal Formulation: With patient Time For Goal Achievement: 12/02/19 Potential to Achieve Goals: Good    Frequency Min 3X/week   Barriers to discharge        Co-evaluation               AM-PAC PT "6 Clicks" Mobility  Outcome Measure Help needed turning from your back to your side while in a flat bed without using bedrails?: None Help needed moving from lying on your back to sitting on the side of a flat bed without using bedrails?: None Help needed moving to and from a bed to a chair (including a wheelchair)?: None Help needed standing up from a chair using your arms (e.g., wheelchair or bedside chair)?: None Help needed to walk in hospital room?: None Help needed climbing 3-5 steps with a railing? : A Little 6 Click Score: 23    End of Session   Activity Tolerance: Patient tolerated treatment well Patient left: in bed;with call bell/phone within reach;with bed alarm set Nurse Communication: Mobility status(O2 sats good on RA) PT Visit Diagnosis: Other abnormalities of gait and mobility (R26.89)    Time: [id number] PT Time Calculation (min) (ACUTE ONLY): 30 min   Charges:   PT Evaluation $PT Eval Low Complexity: 1 Low          Maggie Font, PT Acute Rehab Services Pager [phone] [facility] [phone] [facility] [place] 11/18/2019, 3:23 PM

## 2019-11-18 NOTE — Telephone Encounter (Signed)
Pt's wife, [name], per DPR, called and left VM with our office today to r/s his upcoming appt. I called pt's wife, [name], per DPR. Pt has COVID and is in the hospital. They need his appt cancelled. They will call back to reschedule this appt when pt's quarantine restrictions have been released.

## 2019-11-19 ENCOUNTER — Ambulatory Visit: Payer: Medicare Other | Admitting: Family Medicine

## 2019-11-19 LAB — CBC WITH DIFFERENTIAL/PLATELET
Abs Immature Granulocytes: 0.39 10*3/uL — ABNORMAL HIGH (ref 0.00–0.07)
Basophils Absolute: 0 10*3/uL (ref 0.0–0.1)
Basophils Relative: 0 %
Eosinophils Absolute: 0 10*3/uL (ref 0.0–0.5)
Eosinophils Relative: 0 %
HCT: 38.2 % — ABNORMAL LOW (ref 39.0–52.0)
Hemoglobin: 12.2 g/dL — ABNORMAL LOW (ref 13.0–17.0)
Immature Granulocytes: 3 %
Lymphocytes Relative: 6 %
Lymphs Abs: 0.8 10*3/uL (ref 0.7–4.0)
MCH: 27.2 pg (ref 26.0–34.0)
MCHC: 31.9 g/dL (ref 30.0–36.0)
MCV: 85.1 fL (ref 80.0–100.0)
Monocytes Absolute: 0.5 10*3/uL (ref 0.1–1.0)
Monocytes Relative: 4 %
Neutro Abs: 12.1 10*3/uL — ABNORMAL HIGH (ref 1.7–7.7)
Neutrophils Relative %: 87 %
Platelets: 315 10*3/uL (ref 150–400)
RBC: 4.49 MIL/uL (ref 4.22–5.81)
RDW: 16.8 % — ABNORMAL HIGH (ref 11.5–15.5)
WBC: 13.8 10*3/uL — ABNORMAL HIGH (ref 4.0–10.5)
nRBC: 0 % (ref 0.0–0.2)

## 2019-11-19 LAB — COMPREHENSIVE METABOLIC PANEL
ALT: 42 U/L (ref 0–44)
AST: 36 U/L (ref 15–41)
Albumin: 2.8 g/dL — ABNORMAL LOW (ref 3.5–5.0)
Alkaline Phosphatase: 53 U/L (ref 38–126)
Anion gap: 12 (ref 5–15)
BUN: 35 mg/dL — ABNORMAL HIGH (ref 8–23)
CO2: 16 mmol/L — ABNORMAL LOW (ref 22–32)
Calcium: 8.3 mg/dL — ABNORMAL LOW (ref 8.9–10.3)
Chloride: 111 mmol/L (ref 98–111)
Creatinine, Ser: 1.24 mg/dL (ref 0.61–1.24)
GFR calc Af Amer: >60 mL/min (ref >60)
GFR calc non Af Amer: 58 mL/min — ABNORMAL LOW (ref >60)
Glucose, Bld: 129 mg/dL — ABNORMAL HIGH (ref 70–99)
Potassium: 4.2 mmol/L (ref 3.5–5.1)
Sodium: 139 mmol/L (ref 135–145)
Total Bilirubin: 0.6 mg/dL (ref 0.3–1.2)
Total Protein: 6.6 g/dL (ref 6.5–8.1)

## 2019-11-19 LAB — D-DIMER, QUANTITATIVE: D-Dimer, Quant: 0.7 ug/mL-FEU — ABNORMAL HIGH (ref 0.00–0.50)

## 2019-11-19 LAB — C-REACTIVE PROTEIN: CRP: 6.7 mg/dL — ABNORMAL HIGH (ref <1.0)

## 2019-11-19 LAB — FERRITIN: Ferritin: 448 ng/mL — ABNORMAL HIGH (ref 24–336)

## 2019-11-19 NOTE — Progress Notes (Signed)
PROGRESS NOTE  Jared [name]  DOB: 15-Dec-1950  PCP: Burnard Bunting, MD [id number]  DOA: 11/16/2019 Admitted From: Home  LOS: 3 days   Chief Complaint  Patient presents with  . Shortness of Breath   Brief narrative: Jared [name] is a 69 y.o. male with medical history significant of hypertension, Parkinson disease, asthma and sleep apnea. Patient was brought to the ED from home for several weeks history of malaise, chills as well shortness of breath with cough for less than 24 hours not responsive to inhalers and nebulizers. In the ED, patient was afebrile, hemodynamically stable, O2 sat 89% on room air, improved to more than 90% with 2 L by nasal cannula. Work-up showed WBC count normal at 7.5, creatinine 2, procalcitonin elevated to 2.6, lactic acid less than 2. Rapid COVID-19 antigen test positive. Chest x-ray showed faint bilateral peripheral, subpleural and perihilar hazy densities most consistent with multifocal pneumonia. Patient was admitted to hospital medicine service for further evaluation and management.  Subjective: Patient was seen and examined this morning.  Elderly Caucasian male. Sitting up at the edge of the bed. Not in distress. Oxygen being weaned down to 2 L/min today.  Assessment/Plan: COVID pneumonia Acute respiratory failure with hypoxia 4 L/min -Presented with malaise, shortness of breath, cough -COVID test: Antigen test positive on 2/27. -Chest imaging -multifocal pneumonia -Treatment: Decadron 6 mg daily for 10 days, IV Remdesivir for 5 days to complete on 3/3, -Supportive care: Vitamin C, Zinc, inhalers, Tylenol, Antitussives - benzonatate, Mucinex.  -Protonix while on IV Decadron. -Progression: Patient feels better this morning than at presentation.  Inflammatory markers improving.  Oxygen level improving. -Oxygen - SpO2: 95 % O2 Flow Rate (L/min): 4 L/min -Continue airborne/contact isolation precautions. -WBC and inflammatory markers  trend as below.  No results found for: [place]  Lab 11/16/19 1945 11/17/19 0358 11/18/19 0334 11/19/19 0248  WBC 7.5 6.8 9.5 13.8*   Recent Labs    11/16/19 1945 11/18/19 0334 11/19/19 0248  DDIMER 1.30* 0.68* 0.70*  FERRITIN 495* 469* 448*  LDH 380*  --   --   CRP 22.3* 13.9* 6.7*   Acute kidney injury  -Creatinine elevated to 2 at presentation, baseline creatinine less than 1.4. -Improved creatinine with IV hydration.  Creatinine back to normal.  Hypertension -Continue Imdur.  Continue to monitor blood pressure.  Parkinson's disease Dementia Depression -Abilify, armodafinil, Cymbalta,  Hyperlipidemia -Home meds include alirocumab (PCSK9 inhibitor), Zetia, gemfibrozil.  Continue all  BPH -Continue Sildosin, proscar,   Asthma - Xolair, albuterol, Dulera, Flovent, DuoNeb, Spiriva  Hypothyroidism  -Synthroid  GERD -Prilosec  DVT prophylaxis: Heparin Antimicrobials:  IV remdesivir to continue till tomorrow. Fluid: None Diet: Cardiac diet  Code Status:  Full code Mobility: Encourage ambulation.  PT eval Family Communication:  Updated wife over the phone at bedside Discharge plan:  Disposition: Hopefully home tomorrow on 3/3 after completion of remdesivir.  Most likely requires oxygen at discharge. Barriers: Ongoing treatment of COVID-19 pneumonia.  Consultants:  None  Antimicrobials: Anti-infectives (From admission, onward)   Start     Dose/Rate Route Frequency Ordered Stop   11/17/19 1000  remdesivir 100 mg in sodium chloride 0.9 % 100 mL IVPB  Status:  Discontinued     100 mg 200 mL/hr over 30 Minutes Intravenous Daily 11/16/19 2044 11/16/19 2049   11/17/19 1000  remdesivir 100 mg in sodium chloride 0.9 % 100 mL IVPB     100 mg 200 mL/hr over 30 Minutes Intravenous  Daily 11/16/19 2053 11/21/19 0959   11/16/19 2100  remdesivir 200 mg in sodium chloride 0.9% 250 mL IVPB  Status:  Discontinued     200 mg 580 mL/hr over 30 Minutes  Intravenous Once 11/16/19 2044 11/16/19 2049   11/16/19 2100  remdesivir 100 mg in sodium chloride 0.9 % 100 mL IVPB     100 mg 200 mL/hr over 30 Minutes Intravenous Every 1 hr x 2 11/16/19 2052 11/17/19 0003        Code Status: Full Code   Diet Order            Diet Heart Room service appropriate? Yes; Fluid consistency: Thin  Diet effective now              Infusions:  . remdesivir 100 mg in NS 100 mL 100 mg (11/19/19 0858)    Scheduled Meds: . albuterol  2 puff Inhalation Q6H  . ARIPiprazole  7.5 mg Oral Daily  . Armodafinil  250 mg Oral Daily  . vitamin C  500 mg Oral Daily  . aspirin EC  325 mg Oral Daily  . dexamethasone (DECADRON) injection  6 mg Intravenous QHS  . DULoxetine  60 mg Oral BID  . ezetimibe  10 mg Oral Daily  . finasteride  5 mg Oral Daily  . gemfibrozil  600 mg Oral BID  . heparin  5,000 Units Subcutaneous Q8H  . isosorbide mononitrate  30 mg Oral Daily  . levothyroxine  25 mcg Oral QAC breakfast  . mometasone-formoterol  2 puff Inhalation BID  . montelukast  10 mg Oral QHS  . pantoprazole  40 mg Oral Daily  . umeclidinium bromide  1 puff Inhalation Daily  . zinc sulfate  220 mg Oral Daily    PRN meds: acetaminophen, guaiFENesin-dextromethorphan, ipratropium-albuterol, ondansetron **OR** ondansetron (ZOFRAN) IV   Objective: Vitals:   11/19/19 0509 11/19/19 1347  BP: 119/71 139/88  Pulse: 72 87  Resp:  18  Temp: (!) 97.3 F (36.3 C) 98.1 F (36.7 C)  SpO2: 96% 95%    Intake/Output Summary (Last 24 hours) at 11/19/2019 1610 Last data filed at 11/18/2019 1700 Gross per 24 hour  Intake --  Output 400 ml  Net -400 ml   Filed Weights   11/16/19 1823  Weight: 87.5 kg   Weight change:  Body mass index is 29.78 kg/m.   Physical Exam: General exam: Appears calm and comfortable.  Skin: No rashes, lesions or ulcers. HEENT: Atraumatic, normocephalic, supple neck, no obvious bleeding Lungs: Clear to auscultation bilaterally, no  wheezing, no crackles.  Not in distress.  Feels better.  Seen by GI this morning.  No need of intervention. CVS: Regular rate and rhythm, no murmur GI/Abd soft, nontender, nondistended, bowel sound present CNS: Alert, awake, demented at baseline, cheerful. Psychiatry: Mood appropriate Extremities: No pedal edema, no calf tenderness  Data Review: I have personally reviewed the laboratory data and studies available.  Recent Labs  Lab 11/16/19 1945 11/17/19 0358 11/18/19 0334 11/19/19 0248  WBC 7.5 6.8 9.5 13.8*  NEUTROABS 6.3 5.9 8.4* 12.1*  HGB 14.7 13.6 12.5* 12.2*  HCT 45.5 42.0 38.5* 38.2*  MCV 84.4 85.7 85.6 85.1  PLT 270 244 276 315   Recent Labs  Lab 11/16/19 1945 11/17/19 0358 11/18/19 0334 11/19/19 0248  NA 133* 138 139 139  K 4.0 4.1 4.2 4.2  CL 102 109 114* 111  CO2 19* 18* 17* 16*  GLUCOSE 119* 149* 163* 129*  BUN 34* 31*  34* 35*  CREATININE 2.00* 1.68* 1.39* 1.24  CALCIUM 8.6* 8.4* 8.2* 8.3*  MG  --  2.3  --   --   PHOS  --  4.5  --   --     Signed, Terrilee Croak, MD [facility] Pager: [phone] (Secure Chat preferred). 11/19/2019

## 2019-11-19 NOTE — Care Management Important Message (Signed)
Important Message  Patient Details IM Letter given to Evette Cristal SW Case Manager to present to the Patient Name: Jared [name] MRN: [id number] Date of Birth: May 11, 1951   Medicare Important Message Given:  Yes     [name] 11/19/2019, 9:59 AM

## 2019-11-19 NOTE — Plan of Care (Signed)
  Problem: Education: Goal: Knowledge of risk factors and measures for prevention of condition will improve Outcome: Progressing   Problem: Coping: Goal: Psychosocial and spiritual needs will be supported Outcome: Progressing   Problem: Respiratory: Goal: Will maintain a patent airway Outcome: Progressing Goal: Complications related to the disease process, condition or treatment will be avoided or minimized Outcome: Progressing   

## 2019-11-19 NOTE — Progress Notes (Signed)
Occupational Therapy Evaluation Patient Details Name: Jared [name] MRN: [id number] DOB: Nov 08, 1950 Today's Date: 11/19/2019    History of Present Illness Pt is a 69 y.o. male with medical history significant of hypertension, Parkinson disease, asthma and sleep apnea. Patient was brought to the ED from home for several weeks history of malaise, chills as well shortness of breath with cough.  Pt admitted with COVID PNE/resp failure and AKI.   Clinical Impression   Patient reports living in a single story home with spouse. Patient reports being Independent with all self-care tasks and functional mobility at PLOF. Overall patient requires set-up/Supervision level for self-care tasks and CGA for functional mobility. Patient was able to mobilize around room with no AD, but required extra time to ensure proper sequencing of steps. Patient verbalized that he has noticed a change in his dynamic standing balance skills. Educated patient on recommendation to use walker for mobilization upon d/c and use of shower chair during bathing to conserve energy. O2 stats dropped to 89% on 4L of O2 via nasal cannula while mobilizing with cues for PLB. Patient will benefit from continued skilled acute OT services to advance skill level with self-care tasks and reduce fall risk.     Follow Up Recommendations  Home health OT    Equipment Recommendations  (Recommend use of shower chair already in home )    Recommendations for Other Services       Precautions / Restrictions Precautions Precautions: Fall;Other (comment)(Airborne) Restrictions Weight Bearing Restrictions: No      Mobility Bed Mobility Overal bed mobility: Needs Assistance Bed Mobility: Supine to Sit;Sit to Supine     Supine to sit: Supervision        Transfers Overall transfer level: Needs assistance Equipment used: None Transfers: Sit to/from Stand Sit to Stand: Min guard              Balance   Sitting-balance support:  Feet supported;No upper extremity supported Sitting balance-Leahy Scale: Normal     Standing balance support: No upper extremity supported;During functional activity Standing balance-Leahy Scale: Good                             ADL either performed or assessed with clinical judgement   ADL Overall ADL's : Needs assistance/impaired Eating/Feeding: Modified independent   Grooming: Oral care;Wash/dry face;Wash/dry hands;Set up;Standing   Upper Body Bathing: Supervision/ safety   Lower Body Bathing: Supervison/ safety   Upper Body Dressing : Set up   Lower Body Dressing: Supervision/safety;Set up   Toilet Transfer: Supervision/safety   Toileting- Clothing Manipulation and Hygiene: Supervision/safety   Tub/ Shower Transfer: Min guard   Functional mobility during ADLs: Min guard       Vision Baseline Vision/History: No visual deficits       Perception     Praxis      Pertinent Vitals/Pain Pain Assessment: No/denies pain     Hand Dominance Right   Extremity/Trunk Assessment     Lower Extremity Assessment Lower Extremity Assessment: Defer to PT evaluation       Communication Communication Communication: No difficulties   Cognition Arousal/Alertness: Awake/alert Behavior During Therapy: WFL for tasks assessed/performed Overall Cognitive Status: Within Functional Limits for tasks assessed                                     General Comments  Requires  extra time to ensure proper balance when mobilizing.    Exercises     Shoulder Instructions      Home Living Family/patient expects to be discharged to:: Private residence Living Arrangements: Spouse/significant other Available Help at Discharge: Family;Available 24 hours/day Type of Home: House Home Access: Stairs to enter [employer] of Steps: 2 Entrance Stairs-Rails: Left Home Layout: One level     Bathroom Shower/Tub: Teacher, early years/pre:  Standard Bathroom Accessibility: Yes How Accessible: Accessible via walker Home Equipment: Grab bars - tub/shower;Shower seat;Cane - single point          Prior Functioning/Environment Level of Independence: Independent                 OT Problem List: Decreased activity tolerance;Impaired balance (sitting and/or standing);Decreased safety awareness      OT Treatment/Interventions: Self-care/ADL training;Therapeutic exercise;Energy conservation;Therapeutic activities;Balance training;Patient/family education;DME and/or AE instruction    OT Goals(Current goals can be found in the care plan section) Acute Rehab OT Goals Patient Stated Goal: return home OT Goal Formulation: With patient Time For Goal Achievement: 12/03/19  OT Frequency: Min 2X/week   Barriers to D/C:            Co-evaluation              AM-PAC OT "6 Clicks" Daily Activity     Outcome Measure Help from another person eating meals?: None Help from another person taking care of personal grooming?: A Little Help from another person toileting, which includes using toliet, bedpan, or urinal?: A Little Help from another person bathing (including washing, rinsing, drying)?: A Little Help from another person to put on and taking off regular upper body clothing?: A Little Help from another person to put on and taking off regular lower body clothing?: A Little 6 Click Score: 19   End of Session Equipment Utilized During Treatment: Oxygen Nurse Communication: Mobility status  Activity Tolerance: Patient tolerated treatment well Patient left: in bed;with bed alarm set;with call bell/phone within reach  OT Visit Diagnosis: Muscle weakness (generalized) (M62.81);Unsteadiness on feet (R26.81)                Time: [id number] OT Time Calculation (min): 39 min Charges:  OT General Charges $OT Visit: 1 Visit OT Evaluation $OT Eval Moderate Complexity: 1 Mod OT Treatments $Self Care/Home Management : 8-22  mins $Therapeutic Activity: 8-22 mins  Ruari Duggan OTR/L   Yousra Ivens 11/19/2019, 4:00 PM

## 2019-11-20 DIAGNOSIS — N179 Acute kidney failure, unspecified: Secondary | ICD-10-CM

## 2019-11-20 LAB — CBC WITH DIFFERENTIAL/PLATELET
Abs Immature Granulocytes: 0.66 10*3/uL — ABNORMAL HIGH (ref 0.00–0.07)
Basophils Absolute: 0.1 10*3/uL (ref 0.0–0.1)
Basophils Relative: 1 %
Eosinophils Absolute: 0 10*3/uL (ref 0.0–0.5)
Eosinophils Relative: 0 %
HCT: 40.8 % (ref 39.0–52.0)
Hemoglobin: 13.1 g/dL (ref 13.0–17.0)
Immature Granulocytes: 5 %
Lymphocytes Relative: 7 %
Lymphs Abs: 0.9 10*3/uL (ref 0.7–4.0)
MCH: 27.9 pg (ref 26.0–34.0)
MCHC: 32.1 g/dL (ref 30.0–36.0)
MCV: 86.8 fL (ref 80.0–100.0)
Monocytes Absolute: 0.6 10*3/uL (ref 0.1–1.0)
Monocytes Relative: 5 %
Neutro Abs: 11.6 10*3/uL — ABNORMAL HIGH (ref 1.7–7.7)
Neutrophils Relative %: 82 %
Platelets: 370 10*3/uL (ref 150–400)
RBC: 4.7 MIL/uL (ref 4.22–5.81)
RDW: 17.2 % — ABNORMAL HIGH (ref 11.5–15.5)
WBC: 13.9 10*3/uL — ABNORMAL HIGH (ref 4.0–10.5)
nRBC: 0 % (ref 0.0–0.2)

## 2019-11-20 LAB — COMPREHENSIVE METABOLIC PANEL
ALT: 47 U/L — ABNORMAL HIGH (ref 0–44)
AST: 39 U/L (ref 15–41)
Albumin: 3 g/dL — ABNORMAL LOW (ref 3.5–5.0)
Alkaline Phosphatase: 59 U/L (ref 38–126)
Anion gap: 12 (ref 5–15)
BUN: 31 mg/dL — ABNORMAL HIGH (ref 8–23)
CO2: 15 mmol/L — ABNORMAL LOW (ref 22–32)
Calcium: 8.2 mg/dL — ABNORMAL LOW (ref 8.9–10.3)
Chloride: 110 mmol/L (ref 98–111)
Creatinine, Ser: 1.26 mg/dL — ABNORMAL HIGH (ref 0.61–1.24)
GFR calc Af Amer: >60 mL/min (ref >60)
GFR calc non Af Amer: 57 mL/min — ABNORMAL LOW (ref >60)
Glucose, Bld: 111 mg/dL — ABNORMAL HIGH (ref 70–99)
Potassium: 4.4 mmol/L (ref 3.5–5.1)
Sodium: 137 mmol/L (ref 135–145)
Total Bilirubin: 0.7 mg/dL (ref 0.3–1.2)
Total Protein: 6.8 g/dL (ref 6.5–8.1)

## 2019-11-20 LAB — D-DIMER, QUANTITATIVE: D-Dimer, Quant: 0.75 ug/mL-FEU — ABNORMAL HIGH (ref 0.00–0.50)

## 2019-11-20 LAB — FERRITIN: Ferritin: 334 ng/mL (ref 24–336)

## 2019-11-20 LAB — C-REACTIVE PROTEIN: CRP: 3.6 mg/dL — ABNORMAL HIGH (ref <1.0)

## 2019-11-20 MED ORDER — ZINC SULFATE 220 (50 ZN) MG PO CAPS: 220.0000 mg | ORAL_CAPSULE | Freq: Every day | ORAL | 0 refills | Status: AC

## 2019-11-20 MED ORDER — GUAIFENESIN-DM 100-10 MG/5ML PO SYRP: 10.0000 mL | ORAL_SOLUTION | ORAL | 0 refills | Status: DC | PRN

## 2019-11-20 MED ORDER — ALBUTEROL SULFATE HFA 108 (90 BASE) MCG/ACT IN AERS: 1.0000 | INHALATION_SPRAY | RESPIRATORY_TRACT | Status: DC | PRN

## 2019-11-20 MED ORDER — SODIUM BICARBONATE 650 MG PO TABS: 650.0000 mg | ORAL_TABLET | Freq: Two times a day (BID) | ORAL | 0 refills | Status: DC

## 2019-11-20 MED ORDER — ALBUTEROL SULFATE HFA 108 (90 BASE) MCG/ACT IN AERS
2.0000 | INHALATION_SPRAY | Freq: Three times a day (TID) | RESPIRATORY_TRACT | Status: DC
  Administered 2019-11-20 (×2): 2 via RESPIRATORY_TRACT

## 2019-11-20 MED ORDER — DEXAMETHASONE 6 MG PO TABS: 6.0000 mg | ORAL_TABLET | Freq: Two times a day (BID) | ORAL | 0 refills | Status: AC

## 2019-11-20 NOTE — Progress Notes (Signed)
SATURATION QUALIFICATIONS: (This note is used to comply with regulatory documentation for home oxygen)  Patient Saturations on Room Air at Rest = 91%  Patient Saturations on Room Air while Ambulating = 86%  Patient Saturations on 4 Liters of oxygen while Ambulating = 92%  Please briefly explain why patient needs home oxygen: Pneumonia due to Covid-19

## 2019-11-20 NOTE — TOC Progression Note (Signed)
Transition of Care [facility]) - Progression Note    Patient Details  Name: YAVIEL MALLEY MRN: [id number] Date of Birth: 08/29/1951  [facility] of [facility]) CM/SW Contact  Purcell Mouton, RN Phone Number: 11/20/2019, 2:35 PM  Clinical Narrative:    [facility] will service pt starting this WE related to staffing. [employer] will supply home O2.  Pt is aware of start of care and okay with the WE.         Expected Discharge Plan and Services           Expected Discharge Date: 11/20/19                                     Social Determinants of Health (SDOH) Interventions    Readmission Risk Interventions No flowsheet data found.

## 2019-11-20 NOTE — Discharge Summary (Signed)
Jared [name] [id number] DOB: 13-Nov-1950 DOA: 11/16/2019  PCP: Burnard Bunting, MD  Admit date: 11/16/2019 Discharge date: 11/20/2019  Admitted From: Home Disposition: Home  Recommendations for Outpatient Follow-up:  1. Follow up with PCP in 1-2 weeks 2. New medications: Decadron 6 mg twice daily x5 days 3. Please follow up on the following pending results:  Home Health: O2, nursing Equipment/Devices: 4 L O2  Discharge Condition: Stable CODE STATUS: Full code   Brief/Interim Summary: History of present illness:  Jared [name] is a 69 y.o. year old male with medical history significant for hypertension, Parkinson disease, asthma and sleep apnea who presented on 11/16/2019 with worsening shortness of breath, cough with no improvement with home inhaler use, body aches, chills for several weeks with loss of taste and smell.  Patient was found to have oxygen saturation of 89% on room air requiring 2 L O2 in ED.  Chest x-ray showed faint bilateral hazy densities most consistent with multifocal pneumonia, with high concern for atypical/viral etiology including Covid-19.Marland Kitchen  Procalcitonin 2.6.  Creatinine 2.  Patient was started on Decadron remdesivir for COVID-19 infection/pneumonia.  Remaining hospital course addressed in problem based format below:   Hospital Course:   Acute hypoxic respiratory failure secondary to COVID-19 multifocal pneumonia.   Patient was started on remdesivir completed 5 days of total therapy in conjunction with Decadron, as well as supplementation with vitamin C, zinc, and supportive measures including ambulation/increase mobility.  Patient's inflammatory markers improve significantly (CRP down to 3.6, peak of 13.9, D-dimer 0.75, peak of 1.3, ferritin back is within normal limits).  Patient be discharged on home O2 (4 L with ambulation), was able to decrease to 2-3 L at rest.  He will complete remaining 5 days of Decadron on discharge.  AKI on CKD Stage 3.    Likely prerenal etiology related to nausea/vomiting/diminished appetite in setting of above infection.  Creatinine returned to baseline with IV fluids, supportive care.  Creatinine of 1.26 on discharge consistent with baseline.  Non-anion gap metabolic acidosis, new Likely due to CKD, given not having any diarrhea, no new medications. CO2 15 on discharge. Still making urine, no other electrolyte abnormalities We will start low-dose sodium bicarb, advise follow-up with PCP, discussed plan with his wife on phone.  Patient can resume home medications for chronic illnesses below, for which no changes were initiated during hospital stay. HTN Parkinson's disease/dementia Depression HLD BPH Asthma Hypothyroidism GERD   Consultations:  none  Procedures/Studies: none Subjective: Breathing is good. Occasional cough. Walking well with O2. Eating ok. No diarrhea.  Discharge Exam: Vitals:   11/20/19 0454 11/20/19 1154  BP: 136/85 (!) 151/84  Pulse: 73 76  Resp: 18 (!) 24  Temp: (!) 97.1 F (36.2 C) (!) 97.3 F (36.3 C)  SpO2: 94% 92%   Vitals:   11/19/19 1600 11/19/19 2023 11/20/19 0454 11/20/19 1154  BP:  (!) 151/82 136/85 (!) 151/84  Pulse:  75 73 76  Resp:  18 18 (!) 24  Temp:  (!) 97.3 F (36.3 C) (!) 97.1 F (36.2 C) (!) 97.3 F (36.3 C)  TempSrc:  Oral Oral Oral  SpO2: 94% 90% 94% 92%  Weight:      Height:        General: Lying in bed, no apparent distress Eyes: EOMI, anicteric ENT: Oral Mucosa clear and moist Cardiovascular: regular rate and rhythm, no murmurs, rubs or gallops, no edema, Respiratory: Normal respiratory effort on 4 L in bed, lungs clear to auscultation bilaterally  Abdomen: soft, non-distended, non-tender, normal bowel sounds Skin: No Rash Neurologic: Grossly no focal neuro deficit.Mental status AAOx3, speech normal, Psychiatric:Appropriate affect, and mood  Discharge Diagnoses:  Principal Problem:   Pneumonia due to COVID-19 virus Active  Problems:   AKI (acute kidney injury) [facility])    Discharge Instructions  Discharge Instructions    Diet - low sodium heart healthy   Complete by: As directed    Increase activity slowly   Complete by: As directed      Allergies as of 11/20/2019      Reactions   Contrast Media [iodinated Diagnostic Agents] Other (See Comments)   Was told not to take d/t pt only having 1 kidney   Nsaids Other (See Comments)   Was told not to take d/t pt only having 1 kidney   Penicillins Hives, Itching, Other (See Comments)   Has patient had a PCN reaction causing immediate rash, facial/tongue/throat swelling, SOB or lightheadedness with hypotension: Unknown Has patient had a PCN reaction causing severe rash involving mucus membranes or skin necrosis: Unknown Has patient had a PCN reaction that required hospitalization: Unknown Has patient had a PCN reaction occurring within the last 10 years: No If all of the above answers are "NO", then may proceed with Cephalosporin use.   Sulfonamide Derivatives Hives, Itching   Codeine Nausea Only   Statins Other (See Comments)   Myalgias      Medication List    TAKE these medications   albuterol 108 (90 Base) MCG/ACT inhaler Commonly known as: VENTOLIN HFA INHALE 2 PUFFS INTO THE LUNGS EVERY 4 HOURS AS NEEDED FOR WHEEZING What changed: See the new instructions.   ARIPiprazole 15 MG tablet Commonly known as: ABILIFY Take 7.5 mg by mouth every morning.   Armodafinil 250 MG tablet Take 250 mg by mouth daily.   aspirin 325 MG tablet Take 325 mg by mouth daily. Taking daily otc   dexamethasone 6 MG tablet Commonly known as: DECADRON Take 1 tablet (6 mg total) by mouth 2 (two) times daily with a meal for 5 days.   Dulera 200-5 MCG/ACT Aero Generic drug: mometasone-formoterol INHALE 2 PUFFS INTO THE LUNGS TWICE DAILY What changed: when to take this   DULoxetine 60 MG capsule Commonly known as: CYMBALTA Take 1 capsule (60 mg total) by mouth 2  (two) times daily.   ezetimibe 10 MG tablet Commonly known as: ZETIA Take 1 tablet (10 mg total) by mouth daily. NEED OV. What changed: additional instructions   finasteride 5 MG tablet Commonly known as: PROSCAR Take 5 mg by mouth daily.   fish oil-omega-3 fatty acids 1000 MG capsule Take 1 g by mouth at bedtime.   Flovent HFA 110 MCG/ACT inhaler Generic drug: fluticasone Inhale 2 puffs into the lungs 2 (two) times daily.   gemfibrozil 600 MG tablet Commonly known as: LOPID Take 600 mg by mouth 2 (two) times daily.   guaiFENesin-dextromethorphan 100-10 MG/5ML syrup Commonly known as: ROBITUSSIN DM Take 10 mLs by mouth every 4 (four) hours as needed for cough.   ipratropium-albuterol 0.5-2.5 (3) MG/3ML Soln Commonly known as: DUONEB Take 3 mLs by nebulization every 6 (six) hours as needed (wheezing, coughing, shortness of breath).   isosorbide mononitrate 30 MG 24 hr tablet Commonly known as: IMDUR take 1 tablet by mouth once daily   levothyroxine 25 MCG tablet Commonly known as: SYNTHROID Take 25 mcg by mouth daily before breakfast.   montelukast 10 MG tablet Commonly known as: SINGULAIR Take 10 mg  by mouth at bedtime.   multivitamin tablet Take 1 tablet by mouth at bedtime.   mupirocin cream 2 % Commonly known as: BACTROBAN Apply 1 application topically 2 (two) times daily.   nitroGLYCERIN 0.4 MG SL tablet Commonly known as: NITROSTAT Place 0.4 mg under the tongue every 5 (five) minutes as needed for chest pain.   omeprazole 20 MG capsule Commonly known as: PRILOSEC TAKE 1 CAPSULE BY MOUTH TWICE A DAY AS DIRECTED What changed: See the new instructions.   Praluent 150 MG/ML Soaj Generic drug: Alirocumab Inject 150 mg into the skin every 14 (fourteen) days.   Rapaflo 8 MG Caps capsule Generic drug: silodosin Take 8 mg by mouth at bedtime.   Spiriva Respimat 1.25 MCG/ACT Aers Generic drug: Tiotropium Bromide Monohydrate Inhale 2 puffs into the lungs  daily.   terbinafine 250 MG tablet Commonly known as: LAMISIL Take 250 mg by mouth daily.   vitamin C 500 MG tablet Commonly known as: ASCORBIC ACID Take 500 mg by mouth 2 (two) times daily.   Xolair 150 MG injection Generic drug: omalizumab INJECT 150MG  SUBCUTANEOUSLY EVERY 4 WEEKS (GIVEN AT  PRESCRIBERS OFFICE) What changed: See the new instructions.   zinc sulfate 220 (50 Zn) MG capsule Take 1 capsule (220 mg total) by mouth daily for 5 days. Start taking on: November 21, 2019            Durable Medical Equipment  (From admission, onward)         Start     Ordered   11/20/19 1156  For home use only DME oxygen  Once    Question Answer Comment  Length of Need 6 Months   Mode or (Route) Nasal cannula   Liters per Minute 4   Frequency Continuous (stationary and portable oxygen unit needed)   Oxygen delivery system Gas      11/20/19 1156          Allergies  Allergen Reactions  . 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 Penicillins Hives, Itching and Other (See Comments)    Has patient had a PCN reaction causing immediate rash, facial/tongue/throat swelling, SOB or lightheadedness with hypotension: Unknown Has patient had a PCN reaction causing severe rash involving mucus membranes or skin necrosis: Unknown Has patient had a PCN reaction that required hospitalization: Unknown Has patient had a PCN reaction occurring within the last 10 years: No If all of the above answers are "NO", then may proceed with Cephalosporin use.   . Sulfonamide Derivatives Hives and Itching  . Codeine Nausea Only  . Statins Other (See Comments)    Myalgias        The results of significant diagnostics from this hospitalization (including imaging, microbiology, ancillary and laboratory) are listed below for reference.      Microbiology: Recent Results (from the past 240 hour(s))  Culture, blood (Routine x 2)     Status: None (Preliminary result)   Collection Time: 11/16/19  7:45 PM   Specimen: BLOOD  Result Value Ref Range Status   Specimen Description   Final    BLOOD RIGHT ANTECUBITAL Performed at [facility] [address], [place], [place] [address]    Special Requests   Final    BOTTLES DRAWN AEROBIC AND ANAEROBIC Blood  Culture results may not be optimal due to an excessive volume of blood received in culture bottles Performed at [facility] [address], [place], [place] [address]    Culture   Final    NO GROWTH 4 DAYS Performed at [facility], [place] [address], [place], [place] [address]    Report Status PENDING  Incomplete  Culture, blood (Routine x 2)     Status: None (Preliminary result)   Collection Time: 11/16/19  9:46 PM   Specimen: BLOOD  Result Value Ref Range Status   Specimen Description   Final    BLOOD LEFT ANTECUBITAL Performed at [facility] [address], [place], [place] [address]    Special Requests   Final    BOTTLES DRAWN AEROBIC AND ANAEROBIC Blood Culture adequate volume Performed at [facility] [address] [place], [place] [address]    Culture   Final    NO GROWTH 3 DAYS Performed at [facility], [place] [address], [place], [place] [address]    Report Status PENDING  Incomplete     Labs: BNP (last 3 results) No results for input(s): BNP in the last 8760 hours. Basic Metabolic Panel: Recent Labs  Lab 11/16/19 1945 11/17/19 0358 11/18/19 0334 11/19/19 0248 11/20/19 0313  NA 133* 138 139 139 137  K 4.0 4.1 4.2 4.2 4.4  CL 102 109 114* 111 110  CO2 19* 18* 17* 16* 15*  GLUCOSE 119* 149* 163* 129* 111*  BUN 34* 31* 34* 35* 31*  CREATININE 2.00* 1.68* 1.39* 1.24 1.26*  CALCIUM 8.6* 8.4* 8.2* 8.3* 8.2*  MG  --  2.3  --   --   --   PHOS  --  4.5  --    --   --    Liver Function Tests: Recent Labs  Lab 11/16/19 1945 11/17/19 0358 11/18/19 0334 11/19/19 0248 11/20/19 0313  AST 68* 51* 49* 36 39  ALT 60* 51* 46* 42 47*  ALKPHOS 72 65 56 53 59  BILITOT 0.7 0.7 0.7 0.6 0.7  PROT 8.5* 7.4 6.8 6.6 6.8  ALBUMIN 3.7 3.2* 2.8* 2.8* 3.0*   No results for input(s): LIPASE, AMYLASE in the last 168 hours. No results for input(s): AMMONIA in the last 168 hours. CBC: Recent Labs  Lab 11/16/19 1945 11/17/19 0358 11/18/19 0334 11/19/19 0248 11/20/19 0313  WBC 7.5 6.8 9.5 13.8* 13.9*  NEUTROABS 6.3 5.9 8.4* 12.1* 11.6*  HGB 14.7 13.6 12.5* 12.2* 13.1  HCT 45.5 42.0 38.5* 38.2* 40.8  MCV 84.4 85.7 85.6 85.1 86.8  PLT 270 244 276 315 370   Cardiac Enzymes: No results for input(s): CKTOTAL, CKMB, CKMBINDEX, TROPONINI in the last 168 hours. BNP: Invalid input(s): POCBNP CBG: No results for input(s): GLUCAP in the last 168 hours. D-Dimer Recent Labs    11/19/19 0248 11/20/19 0313  DDIMER 0.70* 0.75*   Hgb A1c No results for input(s): HGBA1C in the last 72 hours. Lipid Profile No results for input(s): CHOL, HDL, LDLCALC, TRIG, CHOLHDL, LDLDIRECT in the last 72 hours. Thyroid function studies No results for input(s): TSH, T4TOTAL, T3FREE, THYROIDAB in the last 72 hours.  Invalid input(s): FREET3 Anemia work up Recent Labs    11/19/19 0248 11/20/19 0313  FERRITIN 448* 334   Urinalysis    Component Value Date/Time   COLORURINE YELLOW 10/08/2018 1159   APPEARANCEUR CLEAR 10/08/2018 1159   LABSPEC 1.015 10/08/2018 1159   PHURINE 5.0 10/08/2018  [place] 10/08/2018 [place] 10/08/2018 [place] 10/08/2018 [place] 10/08/2018 1159   PROTEINUR NEGATIVE 10/08/2018 1159   UROBILINOGEN 0.2 03/17/2010 1452   NITRITE NEGATIVE 10/08/2018 1159   LEUKOCYTESUR NEGATIVE 10/08/2018 1159   Sepsis Labs Invalid input(s): PROCALCITONIN,  WBC,   LACTICIDVEN Microbiology Recent Results (from the past 240 hour(s))  Culture, blood (Routine x 2)     Status: None (Preliminary result)   Collection Time: 11/16/19  7:45 PM   Specimen: BLOOD  Result Value Ref Range Status   Specimen Description   Final    BLOOD RIGHT ANTECUBITAL Performed at [facility], [place] [address], [place], [place] [address]    Special Requests   Final    BOTTLES DRAWN AEROBIC AND ANAEROBIC Blood Culture results may not be optimal due to an excessive volume of blood received in culture bottles Performed at [facility] [address], [place], [place] [address]    Culture   Final    NO GROWTH 4 DAYS Performed at [facility], [place] [address] [place], [place] [address]    Report Status PENDING  Incomplete  Culture, blood (Routine x 2)     Status: None (Preliminary result)   Collection Time: 11/16/19  9:46 PM   Specimen: BLOOD  Result Value Ref Range Status   Specimen Description   Final    BLOOD LEFT ANTECUBITAL Performed at [facility] [address] [place], [place] [address]    Special Requests   Final    BOTTLES DRAWN AEROBIC AND ANAEROBIC Blood Culture adequate volume Performed at [facility] [address], [place], [place] [address]    Culture   Final    NO GROWTH 3 DAYS Performed at [facility], [place] [address], [place], [place] [address]    Report Status PENDING  Incomplete     Time coordinating discharge: Over 30 minutes  SIGNED:   Desiree Hane, MD  [facility] 11/20/2019, 1:23 PM Pager   If 7PM-7AM, please contact night-coverage [email or web address] Password [email or web address]

## 2019-11-21 ENCOUNTER — Ambulatory Visit: Payer: Self-pay | Admitting: Neurology

## 2019-11-21 LAB — CULTURE, BLOOD (ROUTINE X 2): Culture: NO GROWTH

## 2019-11-22 LAB — CULTURE, BLOOD (ROUTINE X 2)
Culture: NO GROWTH
Special Requests: ADEQUATE

## 2019-12-04 ENCOUNTER — Ambulatory Visit: Payer: Medicare Other | Admitting: Adult Health

## 2019-12-06 ENCOUNTER — Other Ambulatory Visit: Payer: Self-pay

## 2019-12-06 ENCOUNTER — Ambulatory Visit (INDEPENDENT_AMBULATORY_CARE_PROVIDER_SITE_OTHER): Payer: Medicare Other

## 2019-12-06 DIAGNOSIS — J4540 Moderate persistent asthma, uncomplicated: Secondary | ICD-10-CM | POA: Diagnosis not present

## 2019-12-24 ENCOUNTER — Encounter: Payer: Self-pay | Admitting: Podiatry

## 2019-12-24 ENCOUNTER — Ambulatory Visit: Payer: Medicare Other | Admitting: Podiatry

## 2019-12-24 ENCOUNTER — Other Ambulatory Visit: Payer: Self-pay

## 2019-12-24 VITALS — Temp 97.5°F

## 2019-12-24 DIAGNOSIS — B351 Tinea unguium: Secondary | ICD-10-CM | POA: Diagnosis not present

## 2019-12-24 DIAGNOSIS — M79674 Pain in right toe(s): Secondary | ICD-10-CM

## 2019-12-24 DIAGNOSIS — M79675 Pain in left toe(s): Secondary | ICD-10-CM

## 2019-12-24 NOTE — Progress Notes (Signed)
This patient returns to the office for evaluation and treatment of long thick painful nails .  This patient is unable to trim his own nails since the patient cannot reach the feet.  Patient says the nails are painful walking and wearing his shoes.  He returns for preventive foot care services. ? ?General Appearance  Alert, conversant and in no acute stress. ? ?Vascular  Dorsalis pedis and posterior tibial  pulses are palpable  bilaterally.  Capillary return is within normal limits  bilaterally. Temperature is within normal limits  bilaterally. ? ?Neurologic  Senn-Weinstein monofilament wire test within normal limits  bilaterally. Muscle power within normal limits bilaterally. ? ?Nails Thick disfigured discolored nails with subungual debris  from hallux to fifth toes bilaterally. No evidence of bacterial infection or drainage bilaterally. ? ?Orthopedic  No limitations of motion  feet .  No crepitus or effusions noted.  No bony pathology or digital deformities noted. ? ?Skin  normotropic skin with no porokeratosis noted bilaterally.  No signs of infections or ulcers noted.    ? ?Onychomycosis  Pain in toes right foot  Pain in toes left foot ? ?Debridement  of nails  1-5  B/L with a nail nipper.  Nails were then filed using a dremel tool with no incidents.    RTC  4   months  ? ? ?Haroldine Redler DPM  ?

## 2019-12-31 ENCOUNTER — Other Ambulatory Visit: Payer: Self-pay

## 2019-12-31 ENCOUNTER — Ambulatory Visit (INDEPENDENT_AMBULATORY_CARE_PROVIDER_SITE_OTHER): Payer: Medicare Other

## 2019-12-31 DIAGNOSIS — J4540 Moderate persistent asthma, uncomplicated: Secondary | ICD-10-CM

## 2020-01-03 ENCOUNTER — Ambulatory Visit: Payer: Self-pay

## 2020-01-13 ENCOUNTER — Other Ambulatory Visit: Payer: Self-pay

## 2020-01-13 ENCOUNTER — Encounter (HOSPITAL_COMMUNITY): Payer: Self-pay

## 2020-01-13 ENCOUNTER — Emergency Department (HOSPITAL_COMMUNITY)
Admission: EM | Admit: 2020-01-13 | Discharge: 2020-01-13 | Disposition: A | Discharge status: Home / Self Care | Payer: Medicare Other | Attending: Emergency Medicine | Admitting: Emergency Medicine

## 2020-01-13 ENCOUNTER — Emergency Department (HOSPITAL_COMMUNITY): Payer: Medicare Other

## 2020-01-13 DIAGNOSIS — Z79899 Other long term (current) drug therapy: Secondary | ICD-10-CM | POA: Insufficient documentation

## 2020-01-13 DIAGNOSIS — D72829 Elevated white blood cell count, unspecified: Secondary | ICD-10-CM | POA: Insufficient documentation

## 2020-01-13 DIAGNOSIS — I10 Essential (primary) hypertension: Secondary | ICD-10-CM | POA: Diagnosis not present

## 2020-01-13 DIAGNOSIS — I2510 Atherosclerotic heart disease of native coronary artery without angina pectoris: Secondary | ICD-10-CM | POA: Insufficient documentation

## 2020-01-13 DIAGNOSIS — Z7982 Long term (current) use of aspirin: Secondary | ICD-10-CM | POA: Diagnosis not present

## 2020-01-13 DIAGNOSIS — R109 Unspecified abdominal pain: Secondary | ICD-10-CM | POA: Insufficient documentation

## 2020-01-13 DIAGNOSIS — Z96653 Presence of artificial knee joint, bilateral: Secondary | ICD-10-CM | POA: Diagnosis not present

## 2020-01-13 DIAGNOSIS — R339 Retention of urine, unspecified: Secondary | ICD-10-CM | POA: Diagnosis not present

## 2020-01-13 LAB — COMPREHENSIVE METABOLIC PANEL
ALT: 33 U/L (ref 0–44)
AST: 28 U/L (ref 15–41)
Albumin: 4.1 g/dL (ref 3.5–5.0)
Alkaline Phosphatase: 81 U/L (ref 38–126)
Anion gap: 16 — ABNORMAL HIGH (ref 5–15)
BUN: 23 mg/dL (ref 8–23)
CO2: 23 mmol/L (ref 22–32)
Calcium: 8.9 mg/dL (ref 8.9–10.3)
Chloride: 100 mmol/L (ref 98–111)
Creatinine, Ser: 1.23 mg/dL (ref 0.61–1.24)
GFR calc Af Amer: >60 mL/min (ref >60)
GFR calc non Af Amer: 59 mL/min — ABNORMAL LOW (ref >60)
Glucose, Bld: 177 mg/dL — ABNORMAL HIGH (ref 70–99)
Potassium: 3.3 mmol/L — ABNORMAL LOW (ref 3.5–5.1)
Sodium: 139 mmol/L (ref 135–145)
Total Bilirubin: 0.5 mg/dL (ref 0.3–1.2)
Total Protein: 7.9 g/dL (ref 6.5–8.1)

## 2020-01-13 LAB — CBC
HCT: 43.7 % (ref 39.0–52.0)
Hemoglobin: 14.1 g/dL (ref 13.0–17.0)
MCH: 28.1 pg (ref 26.0–34.0)
MCHC: 32.3 g/dL (ref 30.0–36.0)
MCV: 87.1 fL (ref 80.0–100.0)
Platelets: 285 10*3/uL (ref 150–400)
RBC: 5.02 MIL/uL (ref 4.22–5.81)
RDW: 15.3 % (ref 11.5–15.5)
WBC: 18.7 10*3/uL — ABNORMAL HIGH (ref 4.0–10.5)
nRBC: 0 % (ref 0.0–0.2)

## 2020-01-13 LAB — URINALYSIS, ROUTINE W REFLEX MICROSCOPIC
Bilirubin Urine: NEGATIVE
Glucose, UA: NEGATIVE mg/dL
Hgb urine dipstick: NEGATIVE
Ketones, ur: NEGATIVE mg/dL
Leukocytes,Ua: NEGATIVE
Nitrite: NEGATIVE
Protein, ur: NEGATIVE mg/dL
Specific Gravity, Urine: 1.012 (ref 1.005–1.030)
pH: 7 (ref 5.0–8.0)

## 2020-01-13 LAB — LIPASE, BLOOD: Lipase: 16 U/L (ref 11–51)

## 2020-01-13 MED ORDER — SODIUM CHLORIDE 0.9% FLUSH: 3.0000 mL | Freq: Once | INTRAVENOUS | Status: DC

## 2020-01-13 MED ORDER — DICYCLOMINE HCL 20 MG PO TABS
20.0000 mg | ORAL_TABLET | Freq: Three times a day (TID) | ORAL | 0 refills | Status: DC | PRN
Start: 2020-01-13 — End: 2020-07-13

## 2020-01-13 MED ORDER — SODIUM CHLORIDE 0.9 % IV BOLUS
1000.0000 mL | Freq: Once | INTRAVENOUS | Status: AC
  Administered 2020-01-13: 1000 mL via INTRAVENOUS

## 2020-01-13 MED ORDER — HYDROCODONE-ACETAMINOPHEN 5-325 MG PO TABS
1.0000 | ORAL_TABLET | Freq: Once | ORAL | Status: AC
  Administered 2020-01-13: 1 via ORAL
  Filled 2020-01-13: qty 1

## 2020-01-13 MED ORDER — DICYCLOMINE HCL 10 MG PO CAPS
10.0000 mg | ORAL_CAPSULE | Freq: Once | ORAL | Status: AC
  Administered 2020-01-13: 10 mg via ORAL
  Filled 2020-01-13: qty 1

## 2020-01-13 NOTE — ED Triage Notes (Signed)
Patient c/o lower abdominal pain since 0700 today. Patient denies any N/v/d.

## 2020-01-13 NOTE — ED Notes (Signed)
Pt provided cup for UA. Unable to provide sample at this time.

## 2020-01-13 NOTE — Discharge Instructions (Addendum)
You had a Foley placed because you are unable to urinate.  The urine needs to stay until you see your urologist  Take Bentyl as needed for cramps.  Your white blood cell count is still elevated please have it rechecked in the week with your doctor.  Return to ER if you have fever, severe pain, unable to urinate, blood clots in the Foley.

## 2020-01-13 NOTE — ED Provider Notes (Signed)
[facility] DEPT Provider Note   CSN: [id number] Arrival date & time: 01/13/20  1238     History Chief Complaint  Patient presents with  . Abdominal Pain    KASEAN MATCZAK is a 69 y.o. male hx of CAD, HTN, renal cell carcinoma, here presenting with difficulty urinating. Patient states that at 7 AM this morning, he had some pain when he urinates and unable to urinate anymore.  Patient states that he has a history of enlarged prostate but never required a Foley before.  Also has a history of renal cell cancer in remission.  Denies any fevers or chills.  The history is provided by the patient.       Past Medical History:  Diagnosis Date  . Cervical vertebral fusion 05/02/2014   Now presenting with C5-6 radiculopathy  . Coronary artery disease   . Depression   . Depression, prolonged 06/26/2013  . Diverticulosis   . GERD (gastroesophageal reflux disease)   . Heart attack ([place]) 2009  . History of esophageal stricture   . Hyperlipidemia   . Hypertension   . IBS (irritable bowel syndrome)   . Internal hemorrhoids   . Osteoarthritis   . 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 Peripheral neuropathy   . Renal cell carcinoma 1997   Left kidney  . 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 Shoulder pain, right   . Sleep apnea    wears CPAP  . Sleep talking   . Snoring 12/25/2013  . Wears glasses     Patient Active Problem List   Diagnosis Date Noted  . AKI (acute kidney injury) ([place]) 11/17/2019  . Pneumonia due to COVID-19 virus 11/16/2019  . OSA on CPAP 08/28/2019  . Pain due to onychomycosis of toenails of both feet 08/27/2019  . 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 Severe persistent asthma without complication [id number]  . Seasonal and perennial allergic rhinitis 01/30/2019  . Trigger finger of right thumb 10/30/2018  . Trigger index finger of right hand 10/30/2018  . Status post total knee replacement, right  04/13/2018  . Stress fracture of femur 03/15/2018  . Severe persistent asthma with acute exacerbation 12/21/2017  . Other allergic rhinitis 12/21/2017  . LPRD (laryngopharyngeal reflux disease) 12/21/2017  . Tear of medial meniscus of knee 12/12/2017  . Osteoarthritis of knee 10/03/2017  . Allergic rhinoconjunctivitis 05/21/2015  . C7 cervical fracture ([place]) 08/27/2014  . Cervical spine fracture ([place]) 08/23/2014  . Abnormal brain MRI 07/23/2014  . 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 Cervical vertebral fusion 05/02/2014  . Snoring 12/25/2013  . 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 Insomnia due to mental condition 12/25/2013  . Sleep talking   . Essential hypertension 07/08/2013  . Depression, prolonged 06/26/2013  . Night sweats 06/26/2013  . Chest pain at rest 06/25/2013  . Bronchiectasis without acute exacerbation ([place]) 01/16/2008  . PULMONARY NODULE 12/20/2007  . HYPERNEPHROMA 11/27/2007  . DYSLIPIDEMIA 11/27/2007  . Coronary atherosclerosis 11/27/2007  . HEMORRHOIDS, INTERNAL 11/27/2007  . Moderate persistent asthma 11/27/2007  . ESOPHAGITIS 11/27/2007  . ESOPHAGEAL STRICTURE 11/27/2007  . GERD 11/27/2007  . GASTRITIS, ACUTE 11/27/2007  . DIVERTICULOSIS, COLON 11/27/2007  . ABDOMINAL PAIN, LEFT UPPER QUADRANT 11/27/2007  . HIP FRACTURE, LEFT 11/27/2007  . FRACTURE, TIBIA 11/27/2007  . BENIGN PROSTATIC HYPERTROPHY, HX OF 11/27/2007    Past Surgical History:  Procedure Laterality Date  . ANTERIOR CERVICAL DECOMP/DISCECTOMY FUSION N/A 08/27/2014   Procedure: Cervical six-seven anterior cervical  decompression fusion with removal of hardware at Cervical five-six;  Surgeon: Erline Levine, MD;  Location: [facility];  Service: Neurosurgery;  Laterality: N/A;  Cervical six-seven anterior cervical decompression fusion with removal of hardware at Cervical five-six  . BACK SURGERY  1992, 2013  . CARDIAC CATHETERIZATION  05/10/2007   Minimal CAD, normal LV systolic  function, medical management  . CARDIAC CATHETERIZATION  04/08/2008   RCA ulcerated 70-80% stenosis, stented with a 3x21mm Endeavor stent at 13atm for 50sec, reduced from 80% ulcerated stenosis to 0%.  . CARDIAC CATHETERIZATION  05/07/2009   50% distal left main disease-IVUS or flow wire too dangerous in particular setting to perform intervention.  Marland Kitchen CARDIAC CATHETERIZATION  03/18/2010   Medical management  . CARDIOVASCULAR STRESS TEST  02/09/2012   Normal, no significant wall abnormalities noted  . CARPAL TUNNEL RELEASE Bilateral   . COLONOSCOPY W/ BIOPSIES AND POLYPECTOMY    . CORONARY STENT PLACEMENT    . FEMUR FRACTURE SURGERY Left 1995  . HERNIA REPAIR    . NECK SURGERY  2005  . NEPHRECTOMY Left    secondary to cancer  . TOTAL KNEE ARTHROPLASTY Left 2005  . TOTAL KNEE ARTHROPLASTY Right 04/13/2018   Procedure: RIGHT TOTAL KNEE ARTHROPLASTY;  Surgeon: Netta Cedars, MD;  Location: [facility];  Service: Orthopedics;  Laterality: Right;       Family History  Problem Relation Age of Onset  . Asthma Mother   . Heart disease Mother   . Hypertension Mother   . Esophageal cancer Father   . Barrett's esophagus Father   . Bone cancer Father        mets from esophagus  . Heart disease Father   . Heart disease Paternal Grandfather   . Heart attack Paternal Grandfather 33  . Colon cancer Maternal Uncle   . Diabetes Sister   . Cancer Sister        Cervical cancer  . Stomach cancer Neg Hx     Social History   Tobacco Use  . Smoking status: Never Smoker  . Smokeless tobacco: Never Used  Substance Use Topics  . Alcohol use: Not Currently  . Drug use: No    Home Medications Prior to Admission medications   Medication Sig Start Date End Date Taking? Authorizing Provider  albuterol (VENTOLIN HFA) 108 (90 Base) MCG/ACT inhaler INHALE 2 PUFFS INTO THE LUNGS EVERY 4 HOURS AS NEEDED FOR WHEEZING Patient taking differently: Inhale 2 puffs into the lungs every 4 (four) hours as needed for  wheezing.  04/09/19   Kozlow, Donnamarie Poag, MD  Alirocumab (PRALUENT) 150 MG/ML SOAJ Inject 150 mg into the skin every 14 (fourteen) days. 06/04/19   Lorretta Harp, MD  ARIPiprazole (ABILIFY) 15 MG tablet Take 7.5 mg by mouth every morning.  09/04/18   [provider]  Armodafinil 250 MG tablet Take 250 mg by mouth daily.  01/22/17   [provider]  aspirin 325 MG tablet Take 325 mg by mouth daily. Taking daily otc    [provider]  DULERA 200-5 MCG/ACT AERO INHALE 2 PUFFS INTO THE LUNGS TWICE DAILY Patient taking differently: Inhale 2 puffs into the lungs in the morning and at bedtime.  10/15/19   Garnet Sierras, DO  DULoxetine (CYMBALTA) 60 MG capsule Take 1 capsule (60 mg total) by mouth 2 (two) times daily. 07/16/19   Kathrynn Ducking, MD  ezetimibe (ZETIA) 10 MG tablet Take 1 tablet (10 mg total) by mouth daily. NEED  OV. Patient taking differently: Take 10 mg by mouth daily.  07/03/17   Lorretta Harp, MD  finasteride (PROSCAR) 5 MG tablet Take 5 mg by mouth daily.     [provider]  fish oil-omega-3 fatty acids 1000 MG capsule Take 1 g by mouth at bedtime.     [provider]  fluticasone (FLOVENT HFA) 110 MCG/ACT inhaler Inhale 2 puffs into the lungs 2 (two) times daily. 03/14/19   Garnet Sierras, DO  gemfibrozil (LOPID) 600 MG tablet Take 600 mg by mouth 2 (two) times daily.    [provider]  ipratropium-albuterol (DUONEB) 0.5-2.5 (3) MG/3ML SOLN Take 3 mLs by nebulization every 6 (six) hours as needed (wheezing, coughing, shortness of breath).    [provider]  isosorbide mononitrate (IMDUR) 30 MG 24 hr tablet take 1 tablet by mouth once daily Patient taking differently: Take 30 mg by mouth daily.  06/21/17   Lorretta Harp, MD  levothyroxine (SYNTHROID, LEVOTHROID) 25 MCG tablet Take 25 mcg by mouth daily before breakfast.     [provider]  montelukast (SINGULAIR) 10 MG tablet Take 10 mg by mouth at bedtime.     [provider]  Multiple Vitamin (MULTIVITAMIN) tablet Take 1 tablet by mouth at bedtime.     [provider]  mupirocin cream (BACTROBAN) 2 % Apply 1 application topically 2 (two) times daily.    [provider]  nitroGLYCERIN (NITROSTAT) 0.4 MG SL tablet Place 0.4 mg under the tongue every 5 (five) minutes as needed for chest pain.    [provider]  omeprazole (PRILOSEC) 20 MG capsule TAKE 1 CAPSULE BY MOUTH TWICE A DAY AS DIRECTED Patient taking differently: Take 20 mg by mouth in the morning and at bedtime.  01/29/18   Kozlow, Donnamarie Poag, MD  silodosin (RAPAFLO) 8 MG CAPS capsule Take 8 mg by mouth at bedtime.     [provider]  sodium bicarbonate 650 MG tablet Take 1 tablet (650 mg total) by mouth 2 (two) times daily. 11/20/19 11/19/20  Oretha Milch D, MD  terbinafine (LAMISIL) 250 MG tablet Take 250 mg by mouth daily.    [provider]  Tiotropium Bromide Monohydrate (SPIRIVA RESPIMAT) 1.25 MCG/ACT AERS Inhale 2 puffs into the lungs daily.    [provider]  vitamin C (ASCORBIC ACID) 500 MG tablet Take 500 mg by mouth 2 (two) times daily.     [provider]  XOLAIR 150 MG injection INJECT 150MG  SUBCUTANEOUSLY EVERY 4 WEEKS (GIVEN AT  PRESCRIBERS OFFICE) Patient taking differently: Inject 150 mg into the skin every 14 (fourteen) days. Given at prescriber's office 07/12/19   Jiles Prows, MD    Allergies    Contrast media [iodinated diagnostic agents], Nsaids, Penicillins, Sulfonamide derivatives, Codeine, and Statins  Review of Systems   Review of Systems  Gastrointestinal: Positive for abdominal pain.  Genitourinary: Positive for difficulty urinating.  All other systems reviewed and are negative.   Physical Exam Updated Vital Signs BP (!) 153/73 (BP Location: Right Arm)   Pulse 86   Temp 98 F (36.7 C) (Oral)   Resp 18   Ht 5' 7.5" (1.715 m)   Wt 90.7 kg   SpO2 100%   BMI 30.86 kg/m   Physical  Exam Vitals and nursing note reviewed.  Constitutional:      Comments: Uncomfortable   HENT:     Head: Normocephalic.  Eyes:     Extraocular Movements: Extraocular movements  intact.  Cardiovascular:     Rate and Rhythm: Normal rate and regular rhythm.     Heart sounds: Normal heart sounds.  Pulmonary:     Effort: Pulmonary effort is normal.     Breath sounds: Normal breath sounds.  Abdominal:     General: Abdomen is flat.     Comments: bladder appears distended and very tender. No CVAT   Skin:    General: Skin is warm.     Capillary Refill: Capillary refill takes less than 2 seconds.  Neurological:     General: No focal deficit present.     Mental Status: He is alert and oriented to person, place, and time.  Psychiatric:        Mood and Affect: Mood normal.        Behavior: Behavior normal.     ED Results / Procedures / Treatments   Labs (all labs ordered are listed, but only abnormal results are displayed) Labs Reviewed  COMPREHENSIVE METABOLIC PANEL - Abnormal; Notable for the following components:      Result Value   Potassium 3.3 (*)    Glucose, Bld 177 (*)    GFR calc non Af Amer 59 (*)    Anion gap 16 (*)    All other components within normal limits  CBC - Abnormal; Notable for the following components:   WBC 18.7 (*)    All other components within normal limits  LIPASE, BLOOD  URINALYSIS, ROUTINE W REFLEX MICROSCOPIC    EKG None  Radiology No results found.  Procedures Procedures (including critical care time)  Medications Ordered in ED Medications  sodium chloride flush (NS) 0.9 % injection 3 mL (has no administration in time range)  sodium chloride 0.9 % bolus 1,000 mL (has no administration in time range)    ED Course  I have reviewed the triage vital signs and the nursing notes.  Pertinent labs & imaging results that were available during my care of the patient were reviewed by me and considered in my medical decision making (see chart for  details).    MDM Rules/Calculators/A&P                      ARASH HEAP is a 69 y.o. male here presenting with dysuria and urinary retention.  Bladder scan performed at bedside and patient has greater than 750 cc in his bladder.  His lower abdomen appears very distended.  Will place Foley and check labs and urinalysis.   7:31 PM  Labs show white count of 18.  His creatinine is normal and he has an anion gap of 16.  I think likely secondary to dehydration and was given 1 L normal saline bolus.  Patient's urinalysis is normal.  I think is more blood cell count is likely from stress and urinary retention. Patient had a Foley placed by nursing and about 1600 cc came out.  Urine culture was sent.  Patient states that he still has some cramps so will discharge home with some Bentyl for cramps.  Recommend that he follows up with his urologist.  Final Clinical Impression(s) / ED Diagnoses Final diagnoses:  None    Rx / DC Orders ED Discharge Orders    None       Drenda Freeze, MD 01/13/20 [phone]

## 2020-01-14 LAB — URINE CULTURE: Culture: NO GROWTH

## 2020-01-18 DIAGNOSIS — U071 COVID-19: Secondary | ICD-10-CM

## 2020-01-18 HISTORY — DX: COVID-19: U07.1

## 2020-01-29 ENCOUNTER — Other Ambulatory Visit (HOSPITAL_COMMUNITY): Payer: Self-pay | Admitting: Registered Nurse

## 2020-01-29 DIAGNOSIS — I129 Hypertensive chronic kidney disease with stage 1 through stage 4 chronic kidney disease, or unspecified chronic kidney disease: Secondary | ICD-10-CM

## 2020-01-29 DIAGNOSIS — R600 Localized edema: Secondary | ICD-10-CM

## 2020-01-30 ENCOUNTER — Ambulatory Visit: Payer: Self-pay

## 2020-02-03 ENCOUNTER — Other Ambulatory Visit: Payer: Self-pay

## 2020-02-03 ENCOUNTER — Ambulatory Visit (INDEPENDENT_AMBULATORY_CARE_PROVIDER_SITE_OTHER): Payer: Medicare Other

## 2020-02-03 ENCOUNTER — Telehealth: Payer: Self-pay | Admitting: *Deleted

## 2020-02-03 DIAGNOSIS — J4540 Moderate persistent asthma, uncomplicated: Secondary | ICD-10-CM | POA: Diagnosis not present

## 2020-02-03 NOTE — Telephone Encounter (Signed)
L/m for patient to contact clinic [facility] MD appt for [place] reapproval

## 2020-02-04 ENCOUNTER — Ambulatory Visit: Payer: Self-pay

## 2020-02-05 ENCOUNTER — Telehealth: Payer: Self-pay | Admitting: Allergy

## 2020-02-05 DIAGNOSIS — J4551 Severe persistent asthma with (acute) exacerbation: Secondary | ICD-10-CM

## 2020-02-05 MED ORDER — ALBUTEROL SULFATE HFA 108 (90 BASE) MCG/ACT IN AERS: INHALATION_SPRAY | RESPIRATORY_TRACT | 0 refills | Status: DC

## 2020-02-05 NOTE — Telephone Encounter (Signed)
Patient called and needs albuterol inhaler called into [facility] on [address]. [phone].

## 2020-02-05 NOTE — Telephone Encounter (Signed)
Spoke with patient and informed him we could send in a courtesy refill however he will need an office visit for further refills. Patient verbalized understanding.

## 2020-02-19 ENCOUNTER — Other Ambulatory Visit: Payer: Self-pay

## 2020-02-19 ENCOUNTER — Ambulatory Visit (HOSPITAL_COMMUNITY): Payer: Medicare Other | Attending: Cardiology

## 2020-02-19 DIAGNOSIS — R600 Localized edema: Secondary | ICD-10-CM | POA: Diagnosis present

## 2020-02-19 DIAGNOSIS — I129 Hypertensive chronic kidney disease with stage 1 through stage 4 chronic kidney disease, or unspecified chronic kidney disease: Secondary | ICD-10-CM | POA: Diagnosis not present

## 2020-02-20 ENCOUNTER — Ambulatory Visit
Admission: RE | Admit: 2020-02-20 | Discharge: 2020-02-20 | Disposition: A | Discharge status: Home / Self Care | Payer: Medicare Other | Source: Ambulatory Visit | Attending: Internal Medicine | Admitting: Internal Medicine

## 2020-02-20 ENCOUNTER — Other Ambulatory Visit: Payer: Self-pay | Admitting: Internal Medicine

## 2020-02-20 DIAGNOSIS — R109 Unspecified abdominal pain: Secondary | ICD-10-CM

## 2020-02-20 DIAGNOSIS — K5790 Diverticulosis of intestine, part unspecified, without perforation or abscess without bleeding: Secondary | ICD-10-CM

## 2020-03-02 ENCOUNTER — Ambulatory Visit: Payer: Self-pay

## 2020-03-09 ENCOUNTER — Ambulatory Visit: Payer: Medicare Other | Admitting: Family

## 2020-03-16 ENCOUNTER — Other Ambulatory Visit: Payer: Self-pay

## 2020-03-16 ENCOUNTER — Encounter: Payer: Self-pay | Admitting: Allergy

## 2020-03-16 ENCOUNTER — Ambulatory Visit: Payer: Medicare Other | Admitting: Allergy

## 2020-03-16 VITALS — BP 132/80 | HR 86 | Resp 17 | Ht 65.0 in | Wt 205.6 lb

## 2020-03-16 DIAGNOSIS — J302 Other seasonal allergic rhinitis: Secondary | ICD-10-CM | POA: Diagnosis not present

## 2020-03-16 DIAGNOSIS — J3089 Other allergic rhinitis: Secondary | ICD-10-CM

## 2020-03-16 DIAGNOSIS — J4550 Severe persistent asthma, uncomplicated: Secondary | ICD-10-CM

## 2020-03-16 DIAGNOSIS — J4551 Severe persistent asthma with (acute) exacerbation: Secondary | ICD-10-CM

## 2020-03-16 DIAGNOSIS — K219 Gastro-esophageal reflux disease without esophagitis: Secondary | ICD-10-CM

## 2020-03-16 DIAGNOSIS — J4540 Moderate persistent asthma, uncomplicated: Secondary | ICD-10-CM

## 2020-03-16 NOTE — Progress Notes (Signed)
Follow Up Note  RE: Jared [name] MRN: [id number] DOB: 1950/11/18 Date of Office Visit: 03/16/2020  Referring provider: Burnard Bunting, MD Primary care provider: Burnard Bunting, MD  Chief Complaint: Asthma  History of Present Illness: I had the pleasure of seeing Jared [name] for a follow up visit at the [facility] on 03/16/2020. He is a 69 y.o. male, who is being followed for asthma, allergic rhinitis, LPRD. His previous allergy office visit was on 07/15/2019 with Dr. [name]. Today is a regular follow up visit. Up to date with COVID-19 vaccine: yes  Severe persistent asthma without complication Denies any SOB, coughing, wheezing, chest tightness, nocturnal awakenings, ER/urgent care visits or prednisone use since the last visit.  No rescue inhaler use.  Currently on Xolair injections every 4 weeks and was due 2 weeks ago.  Taking Singulair 10mg  daily.  Stopped inhalers about 3-4 months ago with no worsening symptoms.   Patient had covid-19 infection in February and was hospitalized for 5 days and now doing back to baseline.   Seasonal and perennial allergic rhinitis Feeling some itching. Taking allegra daily with some benefit.   Assessment and Plan: Jared [name] is a 69 y.o. male with: Severe persistent asthma without complication Past history - last asthma flare in June 2020. Interim history - doing well. Stopped all maintenance inhalers 3-4 months ago with no worsening symptoms. Had COVID-19 in February 2021 and was hospitalized for 5 days. Up to date with COVID-19 vaccine.  Today's spirometry was normal.  Act score of 25.  Daily controller medication(s): ? Continue montelukast 10mg  daily. ? Continue Xolair 150mg  injections every 4 weeks - dose given today.   Prior to physical activity:May use albuterol rescue inhaler 2 puffs 5 to 15 minutes prior to strenuous physical activities.  Rescue medications:May use albuterol rescue inhaler 2 puffs or  nebulizer every 4 to 6 hours as needed for shortness of breath, chest tightness, coughing, and wheezing. Monitor frequency of use.  If your asthma symptoms worsen then let us know.   Repeat spirometry at next visit.   Will not restart maintenance inhalers at this time as patient has been asymptomatic off his medications for the past 3+ months.   Seasonal and perennial allergic rhinitis Past history - 2004 testing positive to ragweed, trees, mold and dust mites. Interim history - stable but noticing some itching since due for Xolair.   Continue environmental control measures.   Continue Singulair 10mg  daily.  May use over the counter antihistamines such as Zyrtec (cetirizine), Claritin (loratadine), Allegra (fexofenadine), or Xyzal (levocetirizine) daily as needed.  LPRD (laryngopharyngeal reflux disease)  Continue Prilosec 20mg  daily.  May take it twice a day on the days the reflux flares up.   Continue reflux diet - avoid spicy foods, alcohol, tomato based products.  Return in about 6 months (around 09/15/2020).  Diagnostics: Spirometry:  Tracings reviewed. His effort: Good reproducible efforts. FVC: 3.22L FEV1: 2.59L, 100% predicted FEV1/FVC ratio: 80% Interpretation: Spirometry consistent with normal pattern.  Please see scanned spirometry results for details.  Medication List:  Current Outpatient Medications  Medication Sig Dispense Refill  . albuterol (VENTOLIN HFA) 108 (90 Base) MCG/ACT inhaler INHALE 2 PUFFS INTO THE LUNGS EVERY 4 HOURS AS NEEDED FOR WHEEZING 6.7 g 0  . amLODipine (NORVASC) 10 MG tablet Take 10 mg by mouth daily.    . Armodafinil 250 MG tablet Take 250 mg by mouth daily.   0  . aspirin 325 MG tablet Take  325 mg by mouth daily. Taking daily otc    . dicyclomine (BENTYL) 20 MG tablet Take 1 tablet (20 mg total) by mouth 3 (three) times daily as needed for spasms. 15 tablet 0  . DULoxetine (CYMBALTA) 30 MG capsule Take 30 mg by mouth every morning.      . ezetimibe (ZETIA) 10 MG tablet Take 1 tablet (10 mg total) by mouth daily. NEED OV. (Patient taking differently: Take 10 mg by mouth daily. ) 1 tablet 0  . finasteride (PROSCAR) 5 MG tablet Take 5 mg by mouth daily.     . fish oil-omega-3 fatty acids 1000 MG capsule Take 1 g by mouth at bedtime.     Marland Kitchen gemfibrozil (LOPID) 600 MG tablet Take 600 mg by mouth 2 (two) times daily.    Marland Kitchen ipratropium-albuterol (DUONEB) 0.5-2.5 (3) MG/3ML SOLN Take 3 mLs by nebulization every 6 (six) hours as needed (wheezing, coughing, shortness of breath).    . isosorbide mononitrate (IMDUR) 30 MG 24 hr tablet take 1 tablet by mouth once daily (Patient taking differently: Take 30 mg by mouth daily. ) 15 tablet 0  . levothyroxine (SYNTHROID, LEVOTHROID) 25 MCG tablet Take 25 mcg by mouth daily before breakfast.     . metroNIDAZOLE (FLAGYL) 500 MG tablet Take 500 mg by mouth 3 (three) times daily.    . montelukast (SINGULAIR) 10 MG tablet Take 10 mg by mouth at bedtime.    . Multiple Vitamin (MULTIVITAMIN) tablet Take 1 tablet by mouth at bedtime.     . mupirocin cream (BACTROBAN) 2 % Apply 1 application topically 2 (two) times daily.    Marland Kitchen omeprazole (PRILOSEC) 20 MG capsule TAKE 1 CAPSULE BY MOUTH TWICE A DAY AS DIRECTED (Patient taking differently: Take 20 mg by mouth in the morning and at bedtime. ) 60 capsule 4  . silodosin (RAPAFLO) 8 MG CAPS capsule Take 8 mg by mouth at bedtime.     . sodium bicarbonate 650 MG tablet Take 1 tablet (650 mg total) by mouth 2 (two) times daily. 720 tablet 0  . telmisartan (MICARDIS) 20 MG tablet Take 20 mg by mouth daily.    Marland Kitchen terbinafine (LAMISIL) 250 MG tablet Take 250 mg by mouth daily.    Marland Kitchen triamcinolone cream (KENALOG) 0.5 % APPLY TOPICALLY TO THE AFFECTED AREA THREE TIMES DAILY    . vitamin C (ASCORBIC ACID) 500 MG tablet Take 500 mg by mouth 2 (two) times daily.     [name] 150 MG injection INJECT 150MG  SUBCUTANEOUSLY EVERY 4 WEEKS (GIVEN AT  PRESCRIBERS OFFICE) (Patient  taking differently: Inject 150 mg into the skin every 14 (fourteen) days. Given at prescriber's office) 1 each 11   Current Facility-Administered Medications  Medication Dose Route Frequency Provider Last Rate Last Admin  . omalizumab [name]) injection 150 mg  150 mg Subcutaneous Q28 days Jiles Prows, MD   150 mg at 03/16/20 1619   Allergies: Allergies  Allergen Reactions  . 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 Penicillins Hives, Itching and Other (See Comments)    Has patient had a PCN reaction causing immediate rash, facial/tongue/throat swelling, SOB or lightheadedness with hypotension: Unknown Has patient had a PCN reaction causing severe rash involving mucus membranes or skin necrosis: Unknown Has patient had  a PCN reaction that required hospitalization: Unknown Has patient had a PCN reaction occurring within the last 10 years: No If all of the above answers are "NO", then may proceed with Cephalosporin use.   . Sulfonamide Derivatives Hives and Itching  . Codeine Nausea Only  . Statins Other (See Comments)    Myalgias   I reviewed his past medical history, social history, family history, and environmental history and no significant changes have been reported from his previous visit.  Review of Systems  Constitutional: Negative for appetite change, chills, fever and unexpected weight change.  HENT: Negative for congestion and rhinorrhea.   Eyes: Negative for itching.  Respiratory: Negative for cough, chest tightness, shortness of breath and wheezing.   Cardiovascular: Negative for chest pain.  Gastrointestinal: Negative for abdominal pain.  Skin: Negative for rash.  Allergic/Immunologic: Positive for environmental allergies.  Neurological: Negative for dizziness and headaches.   Objective: BP 132/80 (BP  Location: Left Arm, Patient Position: Sitting, Cuff Size: Normal)   Pulse 86   Resp 17   Ht 5\' 5"  (1.651 m)   Wt 205 lb 9.6 oz (93.3 kg)   SpO2 96%   BMI 34.21 kg/m  Body mass index is 34.21 kg/m. Physical Exam Vitals and nursing note reviewed.  Constitutional:      Appearance: Normal appearance. He is well-developed.  HENT:     Head: Normocephalic and atraumatic.     Right Ear: Tympanic membrane and external ear normal.     Left Ear: Tympanic membrane and external ear normal.     Nose: Nose normal.     Mouth/Throat:     Mouth: Mucous membranes are moist.     Pharynx: Oropharynx is clear.  Eyes:     Conjunctiva/sclera: Conjunctivae normal.  Cardiovascular:     Rate and Rhythm: Normal rate and regular rhythm.     Heart sounds: Normal heart sounds. No murmur heard.  No friction rub. No gallop.   Pulmonary:     Effort: Pulmonary effort is normal.     Breath sounds: Normal breath sounds. No wheezing, rhonchi or rales.  Musculoskeletal:     Cervical back: Neck supple.  Skin:    General: Skin is warm.     Findings: No rash.  Neurological:     Mental Status: He is alert and oriented to person, place, and time.  Psychiatric:        Mood and Affect: Mood normal.        Behavior: Behavior normal.    Previous notes and tests were reviewed. The plan was reviewed with the patient/family, and all questions/concerned were addressed.  It was my pleasure to see Naithan today and participate in his care. Please feel free to contact me with any questions or concerns.  Sincerely,  Rexene Alberts, DO Allergy & Immunology  [facility] office: [phone] [facility] office: [place] office: [phone]

## 2020-03-16 NOTE — Assessment & Plan Note (Signed)
   Continue Prilosec 20mg  daily.  May take it twice a day on the days the reflux flares up.   Continue reflux diet - avoid spicy foods, alcohol, tomato based products.

## 2020-03-16 NOTE — Assessment & Plan Note (Signed)
Past history - 2004 testing positive to ragweed, trees, mold and dust mites. Interim history - stable but noticing some itching since due for Xolair.   Continue environmental control measures.   Continue Singulair 10mg  daily.  May use over the counter antihistamines such as Zyrtec (cetirizine), Claritin (loratadine), Allegra (fexofenadine), or Xyzal (levocetirizine) daily as needed.

## 2020-03-16 NOTE — Assessment & Plan Note (Signed)
Past history - last asthma flare in June 2020. Interim history - doing well. Stopped all maintenance inhalers 3-4 months ago with no worsening symptoms. Had COVID-19 in February 2021 and was hospitalized for 5 days. Up to date with COVID-19 vaccine.  Today's spirometry was normal.  Act score of 25.  Daily controller medication(s): ? Continue montelukast 10mg  daily. ? Continue Xolair 150mg  injections every 4 weeks - dose given today.   Prior to physical activity:May use albuterol rescue inhaler 2 puffs 5 to 15 minutes prior to strenuous physical activities.  Rescue medications:May use albuterol rescue inhaler 2 puffs or nebulizer every 4 to 6 hours as needed for shortness of breath, chest tightness, coughing, and wheezing. Monitor frequency of use.  If your asthma symptoms worsen then let us know.   Repeat spirometry at next visit.   Will not restart maintenance inhalers at this time as patient has been asymptomatic off his medications for the past 3+ months.

## 2020-03-16 NOTE — Patient Instructions (Addendum)
Severe persistent asthma  Today's spirometry was normal.   Daily controller medication(s): ? Continue montelukast 10mg  daily. ? Continue Xolair 150mg  injections every 4 weeks - dose given today.   Prior to physical activity:May use albuterol rescue inhaler 2 puffs 5 to 15 minutes prior to strenuous physical activities.  Rescue medications:May use albuterol rescue inhaler 2 puffs or nebulizer every 4 to 6 hours as needed for shortness of breath, chest tightness, coughing, and wheezing. Monitor frequency of use.  If your asthma symptoms worsen then let us know.  Asthma control goals:  Full participation in all desired activities (may need albuterol before activity) Albuterol use two times or less a week on average (not counting use with activity) Cough interfering with sleep two times or less a month Oral steroids no more than once a year No hospitalizations  Seasonal and perennial allergic rhinitis Past history - 2004 testing positive to ragweed, trees, mold and dust mites.  Continue environmental control measures.   Continue Singulair 10mg  daily.  May use over the counter antihistamines such as Zyrtec (cetirizine), Claritin (loratadine), Allegra (fexofenadine), or Xyzal (levocetirizine) daily as needed.  LPRD (laryngopharyngeal reflux disease)  Continue Prilosec 20mg  daily.  May take it twice a day on the days the reflux flares up.   Continue reflux diet - avoid spicy foods, alcohol, tomato based products.  Follow up in 6 months or sooner if needed.   Reducing Pollen Exposure . Pollen seasons: trees (spring), grass (summer) and ragweed/weeds (fall). Marland Kitchen Keep windows closed in your home and car to lower pollen exposure.  Susa Simmonds air conditioning in the bedroom and throughout the house if possible.  . Avoid going out in dry windy days - especially early morning. . Pollen counts are highest between 5 - 10 AM and on dry, hot and windy days.  . Save outside activities for  late afternoon or after a heavy rain, when pollen levels are lower.  . Avoid mowing of grass if you have grass pollen allergy. Marland Kitchen Be aware that pollen can also be transported indoors on people and pets.  . Dry your clothes in an automatic dryer rather than hanging them outside where they might collect pollen.  . Rinse hair and eyes before bedtime. Mold Control . Mold and fungi can grow on a variety of surfaces provided certain temperature and moisture conditions exist.  . Outdoor molds grow on plants, decaying vegetation and soil. The major outdoor mold, Alternaria and Cladosporium, are found in very high numbers during hot and dry conditions. Generally, a late summer - fall peak is seen for common outdoor fungal spores. Rain will temporarily lower outdoor mold spore count, but counts rise rapidly when the rainy period ends. . The most important indoor molds are Aspergillus and Penicillium. Dark, humid and poorly ventilated basements are ideal sites for mold growth. The next most common sites of mold growth are the bathroom and the kitchen. Outdoor (Seasonal) Mold Control . Use air conditioning and keep windows closed. . Avoid exposure to decaying vegetation. Marland Kitchen Avoid leaf raking. . Avoid grain handling. . Consider wearing a face mask if working in moldy areas.  Indoor (Perennial) Mold Control  . Maintain humidity below 50%. . Get rid of mold growth on hard surfaces with water, detergent and, if necessary, 5% bleach (do not mix with other cleaners). Then dry the area completely. If mold covers an area more than 10 square feet, consider hiring an Patent examiner. . For clothing, washing with soap and  water is best. If moldy items cannot be cleaned and dried, throw them away. . Remove sources e.g. contaminated carpets. . Repair and seal leaking roofs or pipes. Using dehumidifiers in damp basements may be helpful, but empty the water and clean units regularly to prevent mildew from  forming. All rooms, especially basements, bathrooms and kitchens, require ventilation and cleaning to deter mold and mildew growth. Avoid carpeting on concrete or damp floors, and storing items in damp areas. Control of House Dust Mite Allergen . Dust mite allergens are a common trigger of allergy and asthma symptoms. While they can be found throughout the house, these microscopic creatures thrive in warm, humid environments such as bedding, upholstered furniture and carpeting. . Because so much time is spent in the bedroom, it is essential to reduce mite levels there.  . Encase pillows, mattresses, and box springs in special allergen-proof fabric covers or airtight, zippered plastic covers.  . Bedding should be washed weekly in hot water (130 F) and dried in a hot dryer. Allergen-proof covers are available for comforters and pillows that can't be regularly washed.  Wendee Copp the allergy-proof covers every few months. Minimize clutter in the bedroom. Keep pets out of the bedroom.  Marland Kitchen Keep humidity less than 50% by using a dehumidifier or air conditioning. You can buy a humidity measuring device called a hygrometer to monitor this.  . If possible, replace carpets with hardwood, linoleum, or washable area rugs. If that's not possible, vacuum frequently with a vacuum that has a HEPA filter. . Remove all upholstered furniture and non-washable window drapes from the bedroom. . Remove all non-washable stuffed toys from the bedroom.  Wash stuffed toys weekly.

## 2020-04-02 ENCOUNTER — Encounter: Payer: Self-pay | Admitting: Allergy

## 2020-04-02 ENCOUNTER — Ambulatory Visit: Payer: Medicare Other | Admitting: Allergy

## 2020-04-02 ENCOUNTER — Other Ambulatory Visit: Payer: Self-pay

## 2020-04-02 ENCOUNTER — Ambulatory Visit
Admission: RE | Admit: 2020-04-02 | Discharge: 2020-04-02 | Disposition: A | Discharge status: Home / Self Care | Payer: Medicare Other | Source: Ambulatory Visit | Attending: Allergy | Admitting: Allergy

## 2020-04-02 VITALS — BP 110/62 | HR 88 | Temp 97.6°F | Resp 16

## 2020-04-02 DIAGNOSIS — J3089 Other allergic rhinitis: Secondary | ICD-10-CM

## 2020-04-02 DIAGNOSIS — J4550 Severe persistent asthma, uncomplicated: Secondary | ICD-10-CM

## 2020-04-02 DIAGNOSIS — J302 Other seasonal allergic rhinitis: Secondary | ICD-10-CM

## 2020-04-02 DIAGNOSIS — K219 Gastro-esophageal reflux disease without esophagitis: Secondary | ICD-10-CM

## 2020-04-02 DIAGNOSIS — R0789 Other chest pain: Secondary | ICD-10-CM

## 2020-04-02 NOTE — Patient Instructions (Addendum)
Severe persistent asthma  Current cough symptoms triggered by reflux (see reflux section)  Daily controller medication(s): ? Continue montelukast 10mg  daily. ? Continue Xolair 150mg  injections every 4 weeks - dose given today.   FOR INCREASED SYMPTOMS (COUGH) OR ASTHMA FLARE: TAKE FLOVENT 2 PUFFS TWICE A DAY AND CAN STOP ONCE SYMPTOMS RESOLVE  Prior to physical activity:May use albuterol rescue inhaler 2 puffs 5 to 15 minutes prior to strenuous physical activities.  Rescue medications:May use albuterol rescue inhaler 2 puffs or nebulizer every 4 to 6 hours as needed for shortness of breath, chest tightness, coughing, and wheezing. Monitor frequency of use.  If your asthma symptoms worsen then let us know.  Asthma control goals:  Full participation in all desired activities (may need albuterol before activity) Albuterol use two times or less a week on average (not counting use with activity) Cough interfering with sleep two times or less a month Oral steroids no more than once a year No hospitalizations  Seasonal and perennial allergic rhinitis Past history - 2004 testing positive to ragweed, trees, mold and dust mites.  Continue environmental control measures.   Continue Singulair 10mg  daily.  May use over the counter antihistamines such as Zyrtec (cetirizine), Claritin (loratadine), Allegra (fexofenadine), or Xyzal (levocetirizine) daily as needed.  LPRD (laryngopharyngeal reflux disease)  Stop Prilosec at this time.   Trial Dexilant 60mg  once a day.  If this is more effective in controlling your reflux symptoms let us know.    Continue reflux diet - avoid spicy foods, alcohol, tomato based products.  Chest pain   has tried Tylenol without much relief  Will obtain CXR to make sure no evidence of lung or rib involvement  Recommend also discussing pain with your urologist due to solitary kidney on that side  Follow up in 4-6 months or sooner if needed.

## 2020-04-02 NOTE — Progress Notes (Signed)
Follow-up Note  RE: Jared [name] MRN: [id number] DOB: 01/04/1951 Date of Office Visit: 04/02/2020   History of present illness: Jared [name] is a 69 y.o. male presenting today for sick visit for a cough. He was last seen in the office on 03/16/2020 by Dr. [name]. He states about 2 weeks ago he developed a dry cough. He states prior to the onset of the cough he had worsening reflux symptoms.  He states his reflux and normally can lead to a cough.  He does take Prilosec and Pepcid daily.  He states he has not tried use of his albuterol for this cough as he states this cough is different than his "asthma cough".  He is not noticing any wheezing or chest tightness or shortness of breath.  He does report having 4 to 5 days of right-sided chest pain toward the bottom of his rib cage.  He states it hurts more so towards the evening time.  He states the pain does not really change in intensity with deep breathing or cough.  The chest pain is on the same side that he has his solitary kidney on.  He states he did try Tylenol to help with his pain but it did not do much.  Due to solitary kidney he states he has been advised not to take any NSAIDs. At this time he is not on any inhaler medications but he states he does have Flovent at home as he was placed on this when he was hospitalized for Covid earlier this year.  Review of systems: Review of Systems  Constitutional: Negative.   HENT: Negative.   Eyes: Negative.   Respiratory: Positive for cough. Negative for sputum production, shortness of breath and wheezing.   Cardiovascular: Negative.   Gastrointestinal: Positive for heartburn.  Musculoskeletal: Negative.   Skin: Negative.   Neurological: Negative.     All other systems negative unless noted above in HPI  Past medical/social/surgical/family history have been reviewed and are unchanged unless specifically indicated below.  No changes  Medication List: Current Outpatient Medications   Medication Sig Dispense Refill  . albuterol (VENTOLIN HFA) 108 (90 Base) MCG/ACT inhaler INHALE 2 PUFFS INTO THE LUNGS EVERY 4 HOURS AS NEEDED FOR WHEEZING 6.7 g 0  . amLODipine (NORVASC) 10 MG tablet Take 10 mg by mouth daily.    . Armodafinil 250 MG tablet Take 250 mg by mouth daily.   0  . aspirin 325 MG tablet Take 325 mg by mouth daily. Taking daily otc    . dicyclomine (BENTYL) 20 MG tablet Take 1 tablet (20 mg total) by mouth 3 (three) times daily as needed for spasms. 15 tablet 0  . DULoxetine (CYMBALTA) 30 MG capsule Take 30 mg by mouth every morning.    . ezetimibe (ZETIA) 10 MG tablet Take 1 tablet (10 mg total) by mouth daily. NEED OV. (Patient taking differently: Take 10 mg by mouth daily. ) 1 tablet 0  . finasteride (PROSCAR) 5 MG tablet Take 5 mg by mouth daily.     . fish oil-omega-3 fatty acids 1000 MG capsule Take 1 g by mouth at bedtime.     Marland Kitchen gemfibrozil (LOPID) 600 MG tablet Take 600 mg by mouth 2 (two) times daily.    Marland Kitchen ipratropium-albuterol (DUONEB) 0.5-2.5 (3) MG/3ML SOLN Take 3 mLs by nebulization every 6 (six) hours as needed (wheezing, coughing, shortness of breath).    . isosorbide mononitrate (IMDUR) 30 MG 24 hr tablet take 1  tablet by mouth once daily (Patient taking differently: Take 30 mg by mouth daily. ) 15 tablet 0  . levothyroxine (SYNTHROID, LEVOTHROID) 25 MCG tablet Take 25 mcg by mouth daily before breakfast.     . metroNIDAZOLE (FLAGYL) 500 MG tablet Take 500 mg by mouth 3 (three) times daily.    . montelukast (SINGULAIR) 10 MG tablet Take 10 mg by mouth at bedtime.    . Multiple Vitamin (MULTIVITAMIN) tablet Take 1 tablet by mouth at bedtime.     . mupirocin cream (BACTROBAN) 2 % Apply 1 application topically 2 (two) times daily.    Marland Kitchen omeprazole (PRILOSEC) 20 MG capsule TAKE 1 CAPSULE BY MOUTH TWICE A DAY AS DIRECTED (Patient taking differently: Take 20 mg by mouth in the morning and at bedtime. ) 60 capsule 4  . silodosin (RAPAFLO) 8 MG CAPS capsule Take  8 mg by mouth at bedtime.     . sodium bicarbonate 650 MG tablet Take 1 tablet (650 mg total) by mouth 2 (two) times daily. 720 tablet 0  . telmisartan (MICARDIS) 20 MG tablet Take 20 mg by mouth daily.    Marland Kitchen terbinafine (LAMISIL) 250 MG tablet Take 250 mg by mouth daily.    Marland Kitchen triamcinolone cream (KENALOG) 0.5 % APPLY TOPICALLY TO THE AFFECTED AREA THREE TIMES DAILY    . vitamin C (ASCORBIC ACID) 500 MG tablet Take 500 mg by mouth 2 (two) times daily.     [name] 150 MG injection INJECT 150MG  SUBCUTANEOUSLY EVERY 4 WEEKS (GIVEN AT  PRESCRIBERS OFFICE) (Patient taking differently: Inject 150 mg into the skin every 14 (fourteen) days. Given at prescriber's office) 1 each 11   Current Facility-Administered Medications  Medication Dose Route Frequency Provider Last Rate Last Admin  . omalizumab [name]) injection 150 mg  150 mg Subcutaneous Q28 days Jiles Prows, MD   150 mg at 03/16/20 1619     Known medication allergies: Allergies  Allergen Reactions  . 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 Penicillins Hives, Itching and Other (See Comments)    Has patient had a PCN reaction causing immediate rash, facial/tongue/throat swelling, SOB or lightheadedness with hypotension: Unknown Has patient had a PCN reaction causing severe rash involving mucus membranes or skin necrosis: Unknown Has patient had a PCN reaction that required hospitalization: Unknown Has patient had a PCN reaction occurring within the last 10 years: No If all of the above answers are "NO", then may proceed with Cephalosporin use.   . Sulfonamide Derivatives Hives and Itching  . Codeine Nausea Only  . Statins Other (See Comments)    Myalgias     Physical examination: Blood pressure 110/62, pulse 88, temperature 97.6 F (36.4 C), temperature source  Temporal, resp. rate 16, SpO2 96 %.  General: Alert, interactive, in no acute distress. HEENT: PERRLA, TMs pearly gray, turbinates non-edematous without discharge, post-pharynx non erythematous. Neck: Supple without lymphadenopathy. Lungs: Clear to auscultation without wheezing, rhonchi or rales. {no increased work of breathing. CV: Normal S1, S2 without murmurs. Abdomen: Nondistended, nontender. Skin: Warm and dry, without lesions or rashes. Extremities:  No clubbing, cyanosis or edema. Neuro:   Grossly intact.  Diagnositics/Labs: None today  Assessment and plan:   Severe persistent asthma  Current cough symptoms triggered by reflux (see reflux  section)  Daily controller medication(s): ? Continue montelukast 10mg  daily. ? Continue Xolair 150mg  injections every 4 weeks - dose given today.   FOR INCREASED SYMPTOMS (COUGH) OR ASTHMA FLARE: TAKE FLOVENT 2 PUFFS TWICE A DAY AND CAN STOP ONCE SYMPTOMS RESOLVE  Prior to physical activity:May use albuterol rescue inhaler 2 puffs 5 to 15 minutes prior to strenuous physical activities.  Rescue medications:May use albuterol rescue inhaler 2 puffs or nebulizer every 4 to 6 hours as needed for shortness of breath, chest tightness, coughing, and wheezing. Monitor frequency of use.  If your asthma symptoms worsen then let us know.  Asthma control goals:  Full participation in all desired activities (may need albuterol before activity) Albuterol use two times or less a week on average (not counting use with activity) Cough interfering with sleep two times or less a month Oral steroids no more than once a year No hospitalizations  Seasonal and perennial allergic rhinitis Past history - 2004 testing positive to ragweed, trees, mold and dust mites.  Continue environmental control measures.   Continue Singulair 10mg  daily.  May use over the counter antihistamines such as Zyrtec (cetirizine), Claritin (loratadine), Allegra  (fexofenadine), or Xyzal (levocetirizine) daily as needed.  LPRD (laryngopharyngeal reflux disease)  Stop Prilosec at this time.   Trial Dexilant 60mg  once a day.  If this is more effective in controlling your reflux symptoms let us know.    Continue reflux diet - avoid spicy foods, alcohol, tomato based products.  Chest pain   has tried Tylenol without much relief  Will obtain CXR to make sure no evidence of lung or rib involvement  Recommend also discussing pain with your urologist due to solitary kidney on that side  Follow up in 4-6 months or sooner if needed.   I appreciate the opportunity to take part in Jared [name]'s care. Please do not hesitate to contact me with questions.  Sincerely,   Prudy Feeler, MD Allergy/Immunology [facility]

## 2020-04-13 ENCOUNTER — Ambulatory Visit: Payer: Self-pay

## 2020-04-24 ENCOUNTER — Other Ambulatory Visit: Payer: Self-pay | Admitting: Allergy

## 2020-04-24 NOTE — Telephone Encounter (Signed)
Refills sent in and patient notified.

## 2020-04-24 NOTE — Telephone Encounter (Signed)
Patient called requesting a refill for Flovent. Was last seen 04/02/20, by Dr. Nelva Bush. [place]

## 2020-04-28 ENCOUNTER — Other Ambulatory Visit: Payer: Self-pay

## 2020-04-28 ENCOUNTER — Encounter: Payer: Self-pay | Admitting: Podiatry

## 2020-04-28 ENCOUNTER — Ambulatory Visit: Payer: Medicare Other | Admitting: Podiatry

## 2020-04-28 ENCOUNTER — Telehealth: Payer: Self-pay | Admitting: Allergy

## 2020-04-28 DIAGNOSIS — B351 Tinea unguium: Secondary | ICD-10-CM | POA: Diagnosis not present

## 2020-04-28 DIAGNOSIS — M79675 Pain in left toe(s): Secondary | ICD-10-CM

## 2020-04-28 DIAGNOSIS — M79674 Pain in right toe(s): Secondary | ICD-10-CM

## 2020-04-28 DIAGNOSIS — J4551 Severe persistent asthma with (acute) exacerbation: Secondary | ICD-10-CM

## 2020-04-28 MED ORDER — ALBUTEROL SULFATE HFA 108 (90 BASE) MCG/ACT IN AERS: INHALATION_SPRAY | RESPIRATORY_TRACT | 1 refills | Status: DC

## 2020-04-28 NOTE — Telephone Encounter (Signed)
Patient called and needs to have proair callled into [facility] on [address]. [phone].

## 2020-04-28 NOTE — Progress Notes (Signed)
This patient returns to the office for evaluation and treatment of long thick painful nails .  This patient is unable to trim his own nails since the patient cannot reach the feet.  Patient says the nails are painful walking and wearing his shoes.  He returns for preventive foot care services. ? ?General Appearance  Alert, conversant and in no acute stress. ? ?Vascular  Dorsalis pedis and posterior tibial  pulses are palpable  bilaterally.  Capillary return is within normal limits  bilaterally. Temperature is within normal limits  bilaterally. ? ?Neurologic  Senn-Weinstein monofilament wire test within normal limits  bilaterally. Muscle power within normal limits bilaterally. ? ?Nails Thick disfigured discolored nails with subungual debris  from hallux to fifth toes bilaterally. No evidence of bacterial infection or drainage bilaterally. ? ?Orthopedic  No limitations of motion  feet .  No crepitus or effusions noted.  No bony pathology or digital deformities noted. ? ?Skin  normotropic skin with no porokeratosis noted bilaterally.  No signs of infections or ulcers noted.    ? ?Onychomycosis  Pain in toes right foot  Pain in toes left foot ? ?Debridement  of nails  1-5  B/L with a nail nipper.  Nails were then filed using a dremel tool with no incidents.    RTC  4   months  ? ? ?Symphoni Helbling DPM  ?

## 2020-04-28 NOTE — Telephone Encounter (Signed)
ERX sent to [facility] as requested.  °

## 2020-04-29 ENCOUNTER — Ambulatory Visit: Payer: Medicare Other | Admitting: Podiatry

## 2020-04-29 ENCOUNTER — Encounter: Payer: Self-pay | Admitting: Podiatry

## 2020-04-29 DIAGNOSIS — M722 Plantar fascial fibromatosis: Secondary | ICD-10-CM | POA: Diagnosis not present

## 2020-05-05 NOTE — Progress Notes (Signed)
Subjective:   Patient ID: Jared [name], male   DOB: 69 y.o.   MRN: [id number]   HPI Patient presents with pain in the heel arch of both feet stating its been bothering him for the last several months   ROS      Objective:  Physical Exam  Neuro vascular status intact with patient found to have inflammation pain of the plantar fascial bilateral near its insertion calcaneus and also into the arch area bilateral     Assessment:  Acute fasciitis-like condition bilateral with moderate depression of the arch     Plan:  H&P x-rays reviewed and today I did sterile prep and injected the fascia bilateral 3 mg Kenalog 5 mg Xylocaine advised on support   good shoe gear.  Reappoint to recheck  X-rays indicate that there is small spur moderate depression the arch no indication of stress fracture or advanced arthritis

## 2020-05-08 ENCOUNTER — Telehealth: Payer: Self-pay | Admitting: Neurology

## 2020-05-08 NOTE — Telephone Encounter (Signed)
Pt would like a call from the nurse. CPAP machine making strange noises, not putting out enough air.

## 2020-05-11 ENCOUNTER — Telehealth: Payer: Self-pay | Admitting: Family Medicine

## 2020-05-11 NOTE — Telephone Encounter (Signed)
Called the patient and there was no answer. Left a message for pt.  If pt returns call and I am unable to talk please be sure to ask more information. If it is a problem with his machine he will need to take the machine to DME company or contact the DME company. If wanting and apt, please schedule with NP.

## 2020-05-11 NOTE — Telephone Encounter (Signed)
Pt left a VM asking for a call back to discuss his cpap.

## 2020-05-11 NOTE — Telephone Encounter (Signed)
Called the patient back, there was no answer. Left a detailed message advising the patient to reach out to [employer] regarding his machine. They would be the ones to evaluate what is going on and address the machine. I left their number on the VM for the patient to contact them.  Left instructions for the patient to call us back if still need [place]

## 2020-05-11 NOTE — Telephone Encounter (Signed)
Pt returned phone call from nurse. Ask why nurse called. Reviewed phone note. Inform Pt of phone note. Pt stated CPAP machine making a noise. Informed Pt to call DME.

## 2020-05-11 NOTE — Telephone Encounter (Signed)
Pt called again and left another message asking for a call back to discuss an appt for his cpap.

## 2020-05-13 ENCOUNTER — Telehealth: Payer: Self-pay | Admitting: Neurology

## 2020-05-13 NOTE — Telephone Encounter (Signed)
Pt called wanting to know if he qualifies to receive a new cpap machine or how can he qualify for it. Pt has already taken the machine to the DME like RN has suggested.  Please advise.

## 2020-05-13 NOTE — Telephone Encounter (Signed)
Response from [employer], "Hey there! We actually saw him today. The RT checked the device and didn't find anything wrong - no whistling. It is under warranty .... but it may be two weeks before I get the replacement in. "  **If patient returns call please advise that [employer] can attempt to replace but would be 2 wk overlap before getting the replacement, however they were unable to find anything wrong.

## 2020-05-13 NOTE — Telephone Encounter (Signed)
Called the patient back. There was no answer. As I was waiting on the VM to finish so I could leave a message the call dropped.  If patient calls back, please advise according to the chart he started a new machine jan 2021 which would mean he would not be able to qualify for a new machine for 5 yrs. The patient's current machine should be under a warrenty since less then a year old, in which the DME company can review what that process looks like. I will reach out to the DME as well to get more information.

## 2020-05-13 NOTE — Telephone Encounter (Signed)
Our phone system is back up and running I called and left a detailed message advising of the information below. Advised he could call back or I will call back once I get a response from [employer].

## 2020-05-15 ENCOUNTER — Other Ambulatory Visit: Payer: Self-pay

## 2020-05-15 MED ORDER — DEXLANSOPRAZOLE 60 MG PO CPDR: 60.0000 mg | DELAYED_RELEASE_CAPSULE | Freq: Every day | ORAL | 5 refills | Status: DC

## 2020-06-03 ENCOUNTER — Ambulatory Visit: Payer: Medicare Other | Admitting: Podiatry

## 2020-06-11 ENCOUNTER — Telehealth: Payer: Self-pay

## 2020-06-11 ENCOUNTER — Ambulatory Visit: Payer: Medicare Other | Admitting: Podiatry

## 2020-06-11 ENCOUNTER — Other Ambulatory Visit: Payer: Self-pay

## 2020-06-11 DIAGNOSIS — Q666 Other congenital valgus deformities of feet: Secondary | ICD-10-CM

## 2020-06-11 DIAGNOSIS — M722 Plantar fascial fibromatosis: Secondary | ICD-10-CM | POA: Diagnosis not present

## 2020-06-11 NOTE — Telephone Encounter (Signed)
Called and lmomed the pt regarding praluent and if they are still taking. I left a msg for the pt to call us back asap

## 2020-06-12 NOTE — Progress Notes (Signed)
Subjective:  Patient ID: Jared [name], male    DOB: 04/12/1951,  MRN: [id number]  Chief Complaint  Patient presents with  . Plantar Fasciitis    recurring pain in b/l- states injection helped for a few weeks, pt mentioned he is needing further evlauation     69 y.o. male presents with the above complaint. Patient presents with complaint of bilateral plantar fasciitis pain in the central part of the foot at the central band. Patient states that has been hurting for quite some time. The injection did help for few weeks. He would like another set of injection to help with that. He would like to discuss further treatment options. He states the plantar fascial brace has been helping a lot to. He denies any other acute complaints.   Review of Systems: Negative except as noted in the HPI. Denies N/V/F/Ch.  Past Medical History:  Diagnosis Date  . Cervical vertebral fusion 05/02/2014   Now presenting with C5-6 radiculopathy  . Coronary artery disease   . Depression   . Depression, prolonged 06/26/2013  . Diverticulosis   . GERD (gastroesophageal reflux disease)   . Heart attack ([place]) 2009  . History of esophageal stricture   . Hyperlipidemia   . Hypertension   . IBS (irritable bowel syndrome)   . Internal hemorrhoids   . Osteoarthritis   . 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 Peripheral neuropathy   . Renal cell carcinoma 1997   Left kidney  . 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 Shoulder pain, right   . Sleep apnea    wears CPAP  . Sleep talking   . Snoring 12/25/2013  . Wears glasses     Current Outpatient Medications:  .  albuterol (VENTOLIN HFA) 108 (90 Base) MCG/ACT inhaler, Can inhale two puffs every four to six hours as needed for cough, wheeze, shortness of breath, or chest tightness., Disp: 18 g, Rfl: 1 .  amLODipine (NORVASC) 5 MG tablet, Take 5 mg by mouth daily., Disp: , Rfl:  .  ARIPiprazole (ABILIFY) 5 MG tablet, Take 5 mg by mouth every  morning., Disp: , Rfl:  .  Armodafinil 250 MG tablet, Take 250 mg by mouth daily. , Disp: , Rfl: 0 .  aspirin 325 MG tablet, Take 325 mg by mouth daily. Taking daily otc, Disp: , Rfl:  .  clobetasol ointment (TEMOVATE) 0.05 %, Apply topically., Disp: , Rfl:  .  clomiPHENE (CLOMID) 50 MG tablet, Take 50 mg by mouth 3 (three) times a week., Disp: , Rfl:  .  dexlansoprazole (DEXILANT) 60 MG capsule, Take 1 capsule (60 mg total) by mouth daily., Disp: 30 capsule, Rfl: 5 .  dicyclomine (BENTYL) 20 MG tablet, Take 1 tablet (20 mg total) by mouth 3 (three) times daily as needed for spasms., Disp: 15 tablet, Rfl: 0 .  doxycycline (VIBRAMYCIN) 50 MG capsule, Take 50 mg by mouth 2 (two) times daily., Disp: , Rfl:  .  DULoxetine (CYMBALTA) 30 MG capsule, Take 30 mg by mouth every morning., Disp: , Rfl:  .  erythromycin ophthalmic ointment, SMARTSIG:Sparingly In Eye(s) Twice Daily, Disp: , Rfl:  .  ezetimibe (ZETIA) 10 MG tablet, Take 1 tablet (10 mg total) by mouth daily. NEED OV. (Patient taking differently: Take 10 mg by mouth daily. ), Disp: 1 tablet, Rfl: 0 .  finasteride (PROSCAR) 5 MG tablet, Take 5 mg by mouth daily. , Disp: , Rfl:  .  fish oil-omega-3 fatty acids 1000 MG capsule, Take  1 g by mouth at bedtime. , Disp: , Rfl:  .  FLOVENT HFA 110 MCG/ACT inhaler, INHALE 2 PUFFS INTO THE LUNGS TWICE DAILY, Disp: 12 g, Rfl: 2 .  gemfibrozil (LOPID) 600 MG tablet, Take 600 mg by mouth 2 (two) times daily., Disp: , Rfl:  .  ipratropium-albuterol (DUONEB) 0.5-2.5 (3) MG/3ML SOLN, Take 3 mLs by nebulization every 6 (six) hours as needed (wheezing, coughing, shortness of breath)., Disp: , Rfl:  .  isosorbide mononitrate (IMDUR) 30 MG 24 hr tablet, take 1 tablet by mouth once daily (Patient taking differently: Take 30 mg by mouth daily. ), Disp: 15 tablet, Rfl: 0 .  levothyroxine (SYNTHROID) 50 MCG tablet, Take 50 mcg by mouth every morning., Disp: , Rfl:  .  metroNIDAZOLE (FLAGYL) 500 MG tablet, Take 500 mg by  mouth 3 (three) times daily., Disp: , Rfl:  .  montelukast (SINGULAIR) 10 MG tablet, Take 10 mg by mouth at bedtime., Disp: , Rfl:  .  Multiple Vitamin (MULTIVITAMIN) tablet, Take 1 tablet by mouth at bedtime. , Disp: , Rfl:  .  mupirocin cream (BACTROBAN) 2 %, Apply 1 application topically 2 (two) times daily., Disp: , Rfl:  .  omeprazole (PRILOSEC) 20 MG capsule, TAKE 1 CAPSULE BY MOUTH TWICE A DAY AS DIRECTED (Patient taking differently: Take 20 mg by mouth in the morning and at bedtime. ), Disp: 60 capsule, Rfl: 4 .  silodosin (RAPAFLO) 8 MG CAPS capsule, Take 8 mg by mouth at bedtime. , Disp: , Rfl:  .  sodium bicarbonate 650 MG tablet, Take 1 tablet (650 mg total) by mouth 2 (two) times daily., Disp: 720 tablet, Rfl: 0 .  telmisartan (MICARDIS) 20 MG tablet, Take 20 mg by mouth daily., Disp: , Rfl:  .  terbinafine (LAMISIL) 250 MG tablet, Take 250 mg by mouth daily., Disp: , Rfl:  .  triamcinolone cream (KENALOG) 0.5 %, APPLY TOPICALLY TO THE AFFECTED AREA THREE TIMES DAILY, Disp: , Rfl:  .  vitamin C (ASCORBIC ACID) 500 MG tablet, Take 500 mg by mouth 2 (two) times daily. , Disp: , Rfl:  .  XOLAIR 150 MG injection, INJECT 150MG  SUBCUTANEOUSLY EVERY 4 WEEKS (GIVEN AT  PRESCRIBERS OFFICE) (Patient taking differently: Inject 150 mg into the skin every 14 (fourteen) days. Given at prescriber's office), Disp: 1 each, Rfl: 11  Current Facility-Administered Medications:  .  omalizumab [name]) injection 150 mg, 150 mg, Subcutaneous, Q28 days, Kozlow, Donnamarie Poag, MD, 150 mg at 03/16/20 1619  Social History   Tobacco Use  Smoking Status Never Smoker  Smokeless Tobacco Never Used    Allergies  Allergen Reactions  . 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 Penicillins Hives, Itching and Other (See Comments)    Has patient had a PCN  reaction causing immediate rash, facial/tongue/throat swelling, SOB or lightheadedness with hypotension: Unknown Has patient had a PCN reaction causing severe rash involving mucus membranes or skin necrosis: Unknown Has patient had a PCN reaction that required hospitalization: Unknown Has patient had a PCN reaction occurring within the last 10 years: No If all of the above answers are "NO", then may proceed with Cephalosporin use.   . Sulfonamide Derivatives Hives and Itching  . Codeine Nausea Only  . Statins Other (See Comments)  Myalgias   Objective:  There were no vitals filed for this visit. There is no height or weight on file to calculate BMI. Constitutional Well developed. Well nourished.  Vascular Dorsalis pedis pulses palpable bilaterally. Posterior tibial pulses palpable bilaterally. Capillary refill normal to all digits.  No cyanosis or clubbing noted. Pedal hair growth normal.  Neurologic Normal speech. Oriented to person, place, and time. Epicritic sensation to light touch grossly present bilaterally.  Dermatologic Nails well groomed and normal in appearance. No open wounds. No skin lesions.  Orthopedic: Normal joint ROM without pain or crepitus bilaterally. No visible deformities. Tender to palpation at the calcaneal tuber bilaterally. No pain with calcaneal squeeze bilaterally. Ankle ROM diminished range of motion bilaterally. Silfverskiold Test: positive bilaterally.   Radiographs: None  Assessment:   1. Plantar fasciitis of right foot   2. Plantar fasciitis of left foot   3. Pes planovalgus    Plan:  Patient was evaluated and treated and all questions answered.  Plantar Fasciitis, bilaterally in the central band - XR reviewed as above.  - Educated on icing and stretching. Instructions given.  - Injection delivered to the plantar fascia as below. - DME: Night splint x2 - Pharmacologic management: Meloxicam/Medrol Dose Pak. Educated on risks/benefits  and proper taking of medication.  Procedure: Injection Tendon/Ligament Location: Bilateral plantar fascia at the glabrous junction; medial approach. Skin Prep: alcohol Injectate: 0.5 cc 0.5% marcaine plain, 0.5 cc of 1% Lidocaine, 0.5 cc kenalog 10. Disposition: Patient tolerated procedure well. Injection site dressed with a band-aid.  No follow-ups on file.

## 2020-06-15 ENCOUNTER — Ambulatory Visit: Payer: Self-pay

## 2020-06-16 ENCOUNTER — Ambulatory Visit (INDEPENDENT_AMBULATORY_CARE_PROVIDER_SITE_OTHER): Payer: Medicare Other | Admitting: *Deleted

## 2020-06-16 ENCOUNTER — Other Ambulatory Visit: Payer: Self-pay

## 2020-06-16 DIAGNOSIS — J4540 Moderate persistent asthma, uncomplicated: Secondary | ICD-10-CM | POA: Diagnosis not present

## 2020-07-09 ENCOUNTER — Other Ambulatory Visit: Payer: Self-pay | Admitting: Allergy and Immunology

## 2020-07-13 ENCOUNTER — Ambulatory Visit: Payer: Medicare Other | Admitting: Allergy

## 2020-07-13 ENCOUNTER — Telehealth: Payer: Self-pay

## 2020-07-13 ENCOUNTER — Encounter: Payer: Self-pay | Admitting: Allergy

## 2020-07-13 ENCOUNTER — Other Ambulatory Visit: Payer: Self-pay

## 2020-07-13 DIAGNOSIS — J302 Other seasonal allergic rhinitis: Secondary | ICD-10-CM

## 2020-07-13 DIAGNOSIS — J4551 Severe persistent asthma with (acute) exacerbation: Secondary | ICD-10-CM | POA: Diagnosis not present

## 2020-07-13 DIAGNOSIS — J069 Acute upper respiratory infection, unspecified: Secondary | ICD-10-CM | POA: Insufficient documentation

## 2020-07-13 DIAGNOSIS — J3089 Other allergic rhinitis: Secondary | ICD-10-CM | POA: Diagnosis not present

## 2020-07-13 DIAGNOSIS — K219 Gastro-esophageal reflux disease without esophagitis: Secondary | ICD-10-CM | POA: Diagnosis not present

## 2020-07-13 MED ORDER — PANTOPRAZOLE SODIUM 40 MG PO TBEC
40.0000 mg | DELAYED_RELEASE_TABLET | Freq: Every day | ORAL | 5 refills | Status: DC
Start: 2020-07-13 — End: 2020-07-21

## 2020-07-13 MED ORDER — PREDNISONE 10 MG PO TABS: ORAL_TABLET | ORAL | 0 refills | Status: DC

## 2020-07-13 MED ORDER — MOMETASONE FURO-FORMOTEROL FUM 200-5 MCG/ACT IN AERO: 2.0000 | INHALATION_SPRAY | Freq: Two times a day (BID) | RESPIRATORY_TRACT | 5 refills | Status: DC

## 2020-07-13 NOTE — Progress Notes (Signed)
RE: Jared [name] MRN: [id number] DOB: 1950/09/21 Date of Telemedicine Visit: 07/13/2020  Referring provider: Burnard Bunting, MD Primary care provider: Burnard Bunting, MD  Chief Complaint: Not feeling well  Telemedicine Follow Up Visit via Telephone: I connected with Jared [name] for a follow up on 07/13/20 by telephone and verified that I am speaking with the correct person using two identifiers.   I discussed the limitations, risks, security and privacy concerns of performing an evaluation and management service by telephone and the availability of in person appointments. I also discussed with the patient that there may be a patient responsible charge related to this service. The patient expressed understanding and agreed to proceed.  Patient is at home.  Provider is at the office.  Visit start time: 10:13AM Visit end time: 10:30AM Insurance consent/check in by: front desk Medical consent and medical assistant/nurse: Jared B.  History of Present Illness:  He is a 69 y.o. male, who is being followed for asthma, allergic rhinitis and LPRD. His previous allergy office visit was on 04/02/2020 with Dr. Nelva [name]. Today is a new complaint visit of not feeling well.  Patient developed some sinus symptoms on Saturday afternoon and now feels like it's going down to his chest - wheezing and coughing. He took Mucinex twice a day and Nyquil but not sleeping the past 2 days due to difficulty breathing. Had to use albuterol nebulizer twice with some benefit.  Currently on Flovent 2 puffs once a day and Spiriva 2 puffs once a day. Does not have any other inhalers at home.   Patient is on Xolair injections and due for next dose this week.   No sick contacts. Denies any fevers or chills. He had Covid-19 earlier this year.  Not taking Singulair, antihistamines or nasal sprays.   Seasonal and perennial allergic rhinitis Stable.  LPRD (laryngopharyngeal reflux disease) Liked Protonix  better than Dexilant and would like a refill on Protonix.  Assessment and Plan: Jared [name] is a 69 y.o. male with: Severe persistent asthma with acute exacerbation Past history - Had COVID-19 in February 2021 and was hospitalized for 5 days. Up to date with COVID-19 vaccine Interim history - asthma flaring due to viral URI.  Take prednisone 40mg  daily x 2 days, 30mg  daily x 2 days, 20mg  daily x 2 days and 10mg  daily x 2 days.  Daily controller medication(s): ? START Dulera 248mcg 2 puffs twice a day with spacer and rinse mouth afterwards for 1 month then stop.  ? If you notice worsening symptoms then re-start. ? Continue montelukast 10mg  daily. ? Continue Xolair 150mg  injections every 4 weeks.  Prior to physical activity:May use albuterol rescue inhaler 2 puffs 5 to 15 minutes prior to strenuous physical activities.  Rescue medications:May use albuterol rescue inhaler 2 puffs or nebulizer every 4 to 6 hours as needed for shortness of breath, chest tightness, coughing, and wheezing. Monitor frequency of use.  During upper respiratory infections: Start Dulera 256mcg 2 puffs twice a day for 1-2 weeks.  May need to add on a daily maintenance inhaler if he continues to have flares.   Viral URI  Gave handout on symptomatic management.  Seasonal and perennial allergic rhinitis Past history - 2004 testing positive to ragweed, trees, mold and dust mites. Interim history - stable.  Continue environmental control measures.   Continue Singulair 10mg  daily.  May use over the counter antihistamines such as Zyrtec (cetirizine), Claritin (loratadine), Allegra (fexofenadine), or Xyzal (levocetirizine) daily as needed.  May use  Flonase (fluticasone) nasal spray 1 spray per nostril twice a day as needed for nasal congestion.   LPRD (laryngopharyngeal reflux disease) Dexilant is not as effective.   Restart Prilosec 40mg  daily.    Continue reflux diet - avoid spicy foods, alcohol, tomato based  products.  Return in about 3 months (around 10/13/2020).  Meds ordered this encounter  Medications  . mometasone-formoterol (DULERA) 200-5 MCG/ACT AERO    Sig: Inhale 2 puffs into the lungs in the morning and at bedtime. with spacer and rinse mouth afterwards.    Dispense:  1 each    Refill:  5  . pantoprazole (PROTONIX) 40 MG tablet    Sig: Take 1 tablet (40 mg total) by mouth daily.    Dispense:  30 tablet    Refill:  5  . predniSONE (DELTASONE) 10 MG tablet    Sig: Take prednisone 40mg  daily x 2 days, 30mg  daily x 2 days, 20mg  daily x 2 days and 10mg  daily x 2 days.    Dispense:  20 tablet    Refill:  0   Diagnostics: None.  Medication List:  Current Outpatient Medications  Medication Sig Dispense Refill  . albuterol (VENTOLIN HFA) 108 (90 Base) MCG/ACT inhaler Can inhale two puffs every four to six hours as needed for cough, wheeze, shortness of breath, or chest tightness. 18 g 1  . amLODipine (NORVASC) 5 MG tablet Take 5 mg by mouth daily.    . ARIPiprazole (ABILIFY) 5 MG tablet Take 5 mg by mouth every morning.    . Armodafinil 250 MG tablet Take 250 mg by mouth daily.   0  . aspirin 325 MG tablet Take 325 mg by mouth daily. Taking daily otc    . clobetasol ointment (TEMOVATE) 0.05 % Apply topically.    . clomiPHENE (CLOMID) 50 MG tablet Take 50 mg by mouth 3 (three) times a week.    . doxycycline (VIBRAMYCIN) 50 MG capsule Take 50 mg by mouth 2 (two) times daily.    . DULoxetine (CYMBALTA) 30 MG capsule Take 30 mg by mouth every morning.    . finasteride (PROSCAR) 5 MG tablet Take 5 mg by mouth daily.     . fish oil-omega-3 fatty acids 1000 MG capsule Take 1 g by mouth at bedtime.     Marland Kitchen ipratropium-albuterol (DUONEB) 0.5-2.5 (3) MG/3ML SOLN Take 3 mLs by nebulization every 6 (six) hours as needed (wheezing, coughing, shortness of breath).    . isosorbide mononitrate (IMDUR) 30 MG 24 hr tablet take 1 tablet by mouth once daily (Patient taking differently: Take 30 mg by mouth  daily. ) 15 tablet 0  . levothyroxine (SYNTHROID) 50 MCG tablet Take 50 mcg by mouth every morning.    . Multiple Vitamin (MULTIVITAMIN) tablet Take 1 tablet by mouth at bedtime.     . mupirocin cream (BACTROBAN) 2 % Apply 1 application topically 2 (two) times daily.    . silodosin (RAPAFLO) 8 MG CAPS capsule Take 8 mg by mouth at bedtime.     Marland Kitchen telmisartan (MICARDIS) 20 MG tablet Take 20 mg by mouth daily.    Marland Kitchen terbinafine (LAMISIL) 250 MG tablet Take 250 mg by mouth daily.    Marland Kitchen triamcinolone cream (KENALOG) 0.5 % APPLY TOPICALLY TO THE AFFECTED AREA THREE TIMES DAILY    . vitamin C (ASCORBIC ACID) 500 MG tablet Take 500 mg by mouth 2 (two) times daily.     [name] 150 MG injection INJECT 150MG  SUBCUTANEOUSLY EVERY 4  WEEKS (GIVEN AT MD  OFFICE) 1 each 11  . mometasone-formoterol (DULERA) 200-5 MCG/ACT AERO Inhale 2 puffs into the lungs in the morning and at bedtime. with spacer and rinse mouth afterwards. 1 each 5  . montelukast (SINGULAIR) 10 MG tablet Take 10 mg by mouth at bedtime. (Patient not taking: Reported on 07/13/2020)    . pantoprazole (PROTONIX) 40 MG tablet Take 1 tablet (40 mg total) by mouth daily. 30 tablet 5  . predniSONE (DELTASONE) 10 MG tablet Take prednisone 40mg  daily x 2 days, 30mg  daily x 2 days, 20mg  daily x 2 days and 10mg  daily x 2 days. 20 tablet 0   Current Facility-Administered Medications  Medication Dose Route Frequency Provider Last Rate Last Admin  . omalizumab [name]) injection 150 mg  150 mg Subcutaneous Q28 days Jiles Prows, MD   150 mg at 06/16/20 1543   Allergies: Allergies  Allergen Reactions  . 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 Penicillins Hives, Itching and Other (See Comments)    Has patient had a PCN reaction causing immediate rash, facial/tongue/throat swelling, SOB or  lightheadedness with hypotension: Unknown Has patient had a PCN reaction causing severe rash involving mucus membranes or skin necrosis: Unknown Has patient had a PCN reaction that required hospitalization: Unknown Has patient had a PCN reaction occurring within the last 10 years: No If all of the above answers are "NO", then may proceed with Cephalosporin use.   . Sulfonamide Derivatives Hives and Itching  . Codeine Nausea Only  . Statins Other (See Comments)    Myalgias   I reviewed his past medical history, social history, family history, and environmental history and no significant changes have been reported from his previous visit.  Review of Systems  Constitutional: Negative for appetite change, chills, fever and unexpected weight change.  HENT: Positive for congestion. Negative for rhinorrhea.   Eyes: Negative for itching.  Respiratory: Positive for cough and wheezing. Negative for chest tightness and shortness of breath.   Cardiovascular: Negative for chest pain.  Gastrointestinal: Negative for abdominal pain.  Skin: Negative for rash.  Allergic/Immunologic: Positive for environmental allergies.  Neurological: Negative for dizziness and headaches.   Objective: Physical Exam Not obtained as encounter was done via telephone.   Previous notes and tests were reviewed.  I discussed the assessment and treatment plan with the patient. The patient was provided an opportunity to ask questions and all were answered. The patient agreed with the plan and demonstrated an understanding of the instructions. After visit summary/patient instructions available via printed and patient will pick up this week when coming in for next Xolair injection.   The patient was advised to call back or seek an in-person evaluation if the symptoms worsen or if the condition fails to improve as anticipated.  I provided 17 minutes of non-face-to-face time during this encounter.  It was my pleasure to  participate in [facility] care today. Please feel free to contact me with any questions or concerns.   Sincerely,  Rexene Alberts, DO Allergy & Immunology  [facility] office: [place] office: [phone]

## 2020-07-13 NOTE — Telephone Encounter (Signed)
Error. Appointment scheduled.

## 2020-07-13 NOTE — Assessment & Plan Note (Signed)
   Gave handout on symptomatic management.

## 2020-07-13 NOTE — Assessment & Plan Note (Signed)
Past history - Had COVID-19 in February 2021 and was hospitalized for 5 days. Up to date with COVID-19 vaccine Interim history - asthma flaring due to viral URI.  Take prednisone 40mg  daily x 2 days, 30mg  daily x 2 days, 20mg  daily x 2 days and 10mg  daily x 2 days.  Daily controller medication(s): ? START Dulera 270mcg 2 puffs twice a day with spacer and rinse mouth afterwards for 1 month then stop.  ? If you notice worsening symptoms then re-start. ? Continue montelukast 10mg  daily. ? Continue Xolair 150mg  injections every 4 weeks.  Prior to physical activity:May use albuterol rescue inhaler 2 puffs 5 to 15 minutes prior to strenuous physical activities.  Rescue medications:May use albuterol rescue inhaler 2 puffs or nebulizer every 4 to 6 hours as needed for shortness of breath, chest tightness, coughing, and wheezing. Monitor frequency of use.  During upper respiratory infections: Start Dulera 260mcg 2 puffs twice a day for 1-2 weeks.  May need to add on a daily maintenance inhaler if he continues to have flares.

## 2020-07-13 NOTE — Assessment & Plan Note (Signed)
Past history - 2004 testing positive to ragweed, trees, mold and dust mites. Interim history - stable.  Continue environmental control measures.   Continue Singulair 10mg  daily.  May use over the counter antihistamines such as Zyrtec (cetirizine), Claritin (loratadine), Allegra (fexofenadine), or Xyzal (levocetirizine) daily as needed.  May use Flonase (fluticasone) nasal spray 1 spray per nostril twice a day as needed for nasal congestion.

## 2020-07-13 NOTE — Assessment & Plan Note (Signed)
Dexilant is not as effective.   Restart Prilosec 40mg  daily.    Continue reflux diet - avoid spicy foods, alcohol, tomato based products.

## 2020-07-13 NOTE — Patient Instructions (Addendum)
Severe persistent asthma with acute exacerbation  Take prednisone 40mg  daily x 2 days, 30mg  daily x 2 days, 20mg  daily x 2 days and 10mg  daily x 2 days.  Daily controller medication(s): ? START Dulera 231mcg 2 puffs twice a day with spacer and rinse mouth afterwards for 1 month then stop.  ? If you notice worsening symptoms then re-start. ? Continue montelukast 10mg  daily. ? Continue Xolair 150mg  injections every 4 weeks.  Prior to physical activity:May use albuterol rescue inhaler 2 puffs 5 to 15 minutes prior to strenuous physical activities.  Rescue medications:May use albuterol rescue inhaler 2 puffs or nebulizer every 4 to 6 hours as needed for shortness of breath, chest tightness, coughing, and wheezing. Monitor frequency of use.  During upper respiratory infections: Start Dulera 237mcg 2 puffs twice a day for 1-2 weeks. Asthma control goals:  Full participation in all desired activities (may need albuterol before activity) Albuterol use two times or less a week on average (not counting use with activity) Cough interfering with sleep two times or less a month Oral steroids no more than once a year No hospitalizations  Seasonal and perennial allergic rhinitis Past history - 2004 testing positive to ragweed, trees, mold and dust mites.  Continue environmental control measures.   Continue Singulair 10mg  daily.  May use over the counter antihistamines such as Zyrtec (cetirizine), Claritin (loratadine), Allegra (fexofenadine), or Xyzal (levocetirizine) daily as needed.  May use Flonase (fluticasone) nasal spray 1 spray per nostril twice a day as needed for nasal congestion.   LPRD (laryngopharyngeal reflux disease)  Restart Protonix 40mg  daily.   Continue reflux diet - avoid spicy foods, alcohol, tomato based products.  Follow up in 3 months or sooner if needed.    Drink plenty of fluids.  Water, juice, clear broth or warm lemon water are good choices. Avoid  caffeine and alcohol, which can dehydrate you.  Eat chicken soup.  Chicken soup and other warm fluids can be soothing and loosen congestion.  Rest.  Adjust your room's temperature and humidity.  Keep your room warm but not overheated. If the air is dry, a cool-mist humidifier or vaporizer can moisten the air and help ease congestion and coughing. Keep the humidifier clean to prevent the growth of bacteria and molds.  Soothe your throat.  Perform a saltwater gargle. Dissolve one-quarter to a half teaspoon of salt in a 4- to 8-ounce glass of warm water. This can relieve a sore or scratchy throat temporarily.  Use saline nasal drops.  To help relieve nasal congestion, try saline nasal drops. You can buy these drops over the counter, and they can help relieve symptoms ? even in children.  Take over-the-counter cold and cough medications.  For adults and children older than 5, over-the-counter decongestants, antihistamines and pain relievers might offer some symptom relief. However, they won't prevent a cold or shorten its duration.

## 2020-07-14 ENCOUNTER — Ambulatory Visit (INDEPENDENT_AMBULATORY_CARE_PROVIDER_SITE_OTHER): Payer: Medicare Other

## 2020-07-14 DIAGNOSIS — J4550 Severe persistent asthma, uncomplicated: Secondary | ICD-10-CM

## 2020-07-14 DIAGNOSIS — J4551 Severe persistent asthma with (acute) exacerbation: Secondary | ICD-10-CM

## 2020-07-20 ENCOUNTER — Telehealth: Payer: Self-pay

## 2020-07-20 ENCOUNTER — Other Ambulatory Visit: Payer: Self-pay | Admitting: *Deleted

## 2020-07-20 MED ORDER — DULERA 200-5 MCG/ACT IN AERO
2.0000 | INHALATION_SPRAY | Freq: Two times a day (BID) | RESPIRATORY_TRACT | 5 refills | Status: DC
Start: 2020-07-20 — End: 2020-07-21

## 2020-07-20 MED ORDER — OPTICHAMBER DIAMOND MISC: 0 refills | Status: DC

## 2020-07-20 MED ORDER — MONTELUKAST SODIUM 10 MG PO TABS
10.0000 mg | ORAL_TABLET | Freq: Every day | ORAL | 5 refills | Status: DC
Start: 2020-07-20 — End: 2020-07-21

## 2020-07-20 NOTE — Telephone Encounter (Signed)
Please call patient. Have him come in for a sick visit.   Make sure he's taking: Daily controller medication(s): Use albuterol 2 puffs prior to using [facility] for the next week. Finish prednisone.  START Dulera 259mcg 2 puffs twice a day with spacer and rinse mouth afterwards. Continue montelukast 10mg  daily.  May use albuterol rescue inhaler 2 puffs or nebulizer every 4 to 6 hours as needed for shortness of breath, chest tightness, coughing, and wheezing. May use albuterol rescue inhaler 2 puffs 5 to 15 minutes prior to strenuous physical activities. Monitor frequency of use.

## 2020-07-20 NOTE — Telephone Encounter (Signed)
Patient called stating his asthma is still flared. He is wheezing, congested in his chest and nose. Patient is blowing yellow.  Please Advise.

## 2020-07-20 NOTE — Telephone Encounter (Signed)
Please advise. Thank you

## 2020-07-20 NOTE — Telephone Encounter (Signed)
Patient called back and has been scheduled to come in tomorrow. Advised of asthma action plan per Dr. [name]. Patient verbalized understanding and will be in tomorrow.

## 2020-07-21 ENCOUNTER — Encounter: Payer: Self-pay | Admitting: Allergy & Immunology

## 2020-07-21 ENCOUNTER — Other Ambulatory Visit: Payer: Self-pay

## 2020-07-21 ENCOUNTER — Ambulatory Visit: Payer: Medicare Other | Admitting: Allergy & Immunology

## 2020-07-21 VITALS — BP 128/76 | HR 81 | Temp 98.7°F | Resp 16

## 2020-07-21 DIAGNOSIS — J4551 Severe persistent asthma with (acute) exacerbation: Secondary | ICD-10-CM

## 2020-07-21 DIAGNOSIS — J302 Other seasonal allergic rhinitis: Secondary | ICD-10-CM | POA: Diagnosis not present

## 2020-07-21 DIAGNOSIS — J3089 Other allergic rhinitis: Secondary | ICD-10-CM | POA: Diagnosis not present

## 2020-07-21 DIAGNOSIS — K219 Gastro-esophageal reflux disease without esophagitis: Secondary | ICD-10-CM | POA: Diagnosis not present

## 2020-07-21 MED ORDER — BUDESONIDE 0.5 MG/2ML IN SUSP
0.5000 mg | Freq: Two times a day (BID) | RESPIRATORY_TRACT | 1 refills | Status: DC | PRN
Start: 2020-07-21 — End: 2021-03-26

## 2020-07-21 MED ORDER — ALBUTEROL SULFATE HFA 108 (90 BASE) MCG/ACT IN AERS: INHALATION_SPRAY | RESPIRATORY_TRACT | 1 refills | Status: DC

## 2020-07-21 MED ORDER — IPRATROPIUM-ALBUTEROL 0.5-2.5 (3) MG/3ML IN SOLN: 3.0000 mL | Freq: Four times a day (QID) | RESPIRATORY_TRACT | 1 refills | Status: DC | PRN

## 2020-07-21 MED ORDER — METHYLPREDNISOLONE ACETATE 80 MG/ML IJ SUSP
80.0000 mg | Freq: Once | INTRAMUSCULAR | Status: AC
  Administered 2020-07-21: 80 mg via INTRAMUSCULAR

## 2020-07-21 NOTE — Patient Instructions (Addendum)
1. Severe persistent asthma with acute exacerbation - We did not do lung testing. - DepoMedrol 80mg  IM given today in clinic. - Continue with Xolair monthly. - We are going to add on Pulmicort 0.5 mg twice daily via nebulizer when you are sick (OK to mix with the albuterol). - Call us with an update at the end of the week.  - Daily controller medication(s): Xolair monthly - Prior to physical activity: albuterol 2 puffs 10-15 minutes before physical activity. - Rescue medications: albuterol 4 puffs every 4-6 hours as needed or DuoNeb nebulizer one vial every 4-6 hours as needed - Changes during respiratory infections or worsening symptoms: Add on Pulmicort 0.5mg  mixed with DuoNeb one treatment twice daily for ONE TO TWO WEEKS. - Asthma control goals:  * Full participation in all desired activities (may need albuterol before activity) * Albuterol use two time or less a week on average (not counting use with activity) * Cough interfering with sleep two time or less a month * Oral steroids no more than once a year * No hospitalizations  2. Return in about 6 months (around 01/18/2021).    Please inform us of any Emergency Department visits, hospitalizations, or changes in symptoms. Call us before going to the ED for breathing or allergy symptoms since we might be able to fit you in for a sick visit. Feel free to contact us anytime with any questions, problems, or concerns.  It was a pleasure to meet you today!  Websites that have reliable patient information: 1. American Academy of Asthma, Allergy, and Immunology: www.aaaai.org 2. Food Allergy Research and Education (FARE): foodallergy.org 3. Mothers of Asthmatics: http://www.asthmacommunitynetwork.org 4. American College of Allergy, Asthma, and Immunology: www.acaai.org   COVID-19 Vaccine Information can be found at: ShippingScam.co.uk For questions related to vaccine distribution or  appointments, please email vaccine@[email or web address] .com or call [phone].     "Like" [place] on Facebook and Instagram for our latest updates!     HAPPY FALL!     Make sure you are registered to vote! If you have moved or changed any of your contact information, you will need to get this updated before voting!  In some cases, you MAY be able to register to vote online: [email or web address]

## 2020-07-21 NOTE — Progress Notes (Signed)
FOLLOW UP  Date of Service/Encounter:  07/21/20   Assessment:   Severe persistent asthma with acute exacerbation  LPRD (laryngopharyngeal reflux disease)  Seasonal and perennial allergic rhinitis   Sy presents for an asthma exacerbation. He was partially treated earlier this month, but he did not get full resolution of his symptoms. This is my first time meeting him, but I did administered a Depo-Medrol to help him to recover more quickly. He seems to not want to take a daily controller medication, although I did tell him that this is necessary to maintain good control. We did compromise and I got him to agree to use Pulmicort+albuterol 2-3 times daily for a week or two during flares. Certainly if he is having more flares than this, we will have a more extensive conversation about the need to take a controller EVERY day, but we will start with this plan to treat exacerbations aggressively.   Plan/Recommendations:   1. Severe persistent asthma with acute exacerbation - We did not do lung testing. - DepoMedrol 80mg  IM given today in clinic. - Continue with Xolair monthly. - We are going to add on Pulmicort 0.5 mg twice daily via nebulizer when you are sick (OK to mix with the albuterol). - Call us with an update at the end of the week.  - Daily controller medication(s): Xolair monthly - Prior to physical activity: albuterol 2 puffs 10-15 minutes before physical activity. - Rescue medications: albuterol 4 puffs every 4-6 hours as needed or DuoNeb nebulizer one vial every 4-6 hours as needed - Changes during respiratory infections or worsening symptoms: Add on Pulmicort 0.5mg  mixed with DuoNeb one treatment twice daily for ONE TO TWO WEEKS. - Asthma control goals:  * Full participation in all desired activities (may need albuterol before activity) * Albuterol use two time or less a week on average (not counting use with activity) * Cough interfering with sleep two time or less a  month * Oral steroids no more than once a year * No hospitalizations  2. Return in about 6 months (around 01/18/2021).   Subjective:   Jared [name] is a 69 y.o. male presenting today for follow up of  Chief Complaint  Patient presents with  . Asthma    Jared [name] has a history of the following: Patient Active Problem List   Diagnosis Date Noted  . Viral URI 07/13/2020  . AKI (acute kidney injury) ([place]) 11/17/2019  . Pneumonia due to COVID-19 virus 11/16/2019  . OSA on CPAP 08/28/2019  . Pain due to onychomycosis of toenails of both feet 08/27/2019  . 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 Severe persistent asthma without complication 51/88/4166  . Seasonal and perennial allergic rhinitis 01/30/2019  . Trigger finger of right thumb 10/30/2018  . Trigger index finger of right hand 10/30/2018  . 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 Stress fracture of femur 03/15/2018  . Severe persistent asthma with acute exacerbation 12/21/2017  . Other allergic rhinitis 12/21/2017  . LPRD (laryngopharyngeal reflux disease) 12/21/2017  . Tear of medial meniscus of knee 12/12/2017  . Osteoarthritis of knee 10/03/2017  . Allergic rhinoconjunctivitis 05/21/2015  . C7 cervical fracture ([place]) 08/27/2014  . Cervical spine fracture ([place]) 08/23/2014  . Abnormal brain MRI 07/23/2014  . 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 Cervical vertebral fusion 05/02/2014  . Snoring 12/25/2013  . 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 Insomnia due to mental condition 12/25/2013  . Sleep talking   .  Essential hypertension 07/08/2013  . Depression, prolonged 06/26/2013  . Night sweats 06/26/2013  . Chest pain at rest 06/25/2013  . Bronchiectasis without acute exacerbation ([place]) 01/16/2008  . PULMONARY NODULE 12/20/2007  . HYPERNEPHROMA 11/27/2007  . DYSLIPIDEMIA 11/27/2007  . Coronary atherosclerosis 11/27/2007  . HEMORRHOIDS, INTERNAL 11/27/2007  . Moderate  persistent asthma 11/27/2007  . ESOPHAGITIS 11/27/2007  . ESOPHAGEAL STRICTURE 11/27/2007  . GERD 11/27/2007  . GASTRITIS, ACUTE 11/27/2007  . DIVERTICULOSIS, COLON 11/27/2007  . ABDOMINAL PAIN, LEFT UPPER QUADRANT 11/27/2007  . HIP FRACTURE, LEFT 11/27/2007  . FRACTURE, TIBIA 11/27/2007  . BENIGN PROSTATIC HYPERTROPHY, HX OF 11/27/2007    History obtained from: chart review and patient.  Jared [name] is a 69 y.o. male presenting for a sick visit. He was last seen in October 2021 by Dr. [name]. At that time, he was started on systemic prednisone for an asthma exacerbation. It was encouraged that he start Dulera 200/5 two puffs BID. He was continued on Xolair 150mg  monthly. He was diagnosed with a viral URI and symptomatic treatment was recommended. For his allergic rhinitis, he was continued on Singulair 10mg  daily as well as an antihistamine and a nasal steroid. He was restarted on Prilosec since the [place] did not seem to be doing the trick.   Since the last visit, he has continued to have problems. The prednisone did not seem to be as effective.   Asthma/Respiratory Symptom History: He tends to have asthma exacerbations that only repond to DepoMedrol. He is on Xolair which has helped decrease the need for her systemic steroids. He has had chest cold symptoms for around 10 days now. Prednisone was only a "band aid" and did not do much to help his symptoms. He has not had any fever. He did a rapid COVID at home test was negative. He is vaccinated for COVID19. He was actually vaccinated against COVID19 and thne five days later was hospitalized for COVID pneumonia. He did get his second vaccination is June.   Allergic Rhinitis Symptom History: He remains on the Singulair daily as well as an antihistamine as needed. He is not using his nasal steroid routinely. He hasn ot needed antibiotic in quite some time.   Otherwise, there have been no changes to his past medical history, surgical history, family  history, or social history.    Review of Systems  Constitutional: Negative.  Negative for chills, fever, malaise/fatigue and weight loss.  HENT: Negative for congestion, ear discharge, ear pain and sinus pain.   Eyes: Negative for pain, discharge and redness.  Respiratory: Positive for cough and shortness of breath. Negative for sputum production and wheezing.   Cardiovascular: Negative.  Negative for chest pain and palpitations.  Gastrointestinal: Negative for abdominal pain, constipation, diarrhea, heartburn, nausea and vomiting.  Skin: Negative.  Negative for itching and rash.  Neurological: Negative for dizziness and headaches.  Endo/Heme/Allergies: Negative for environmental allergies. Does not bruise/bleed easily.       Objective:   Blood pressure 128/76, pulse 81, temperature 98.7 F (37.1 C), temperature source Temporal, resp. rate 16, SpO2 95 %. There is no height or weight on file to calculate BMI.   Physical Exam:  Physical Exam Constitutional:      Appearance: He is well-developed.     Comments: Pleasant male. Cooperative with the exam. Difficult to get full sentences out.   HENT:     Head: Normocephalic and atraumatic.     Right Ear: Tympanic membrane, ear canal and external ear normal.  Left Ear: Tympanic membrane, ear canal and external ear normal.     Nose: No nasal deformity, septal deviation, mucosal edema or rhinorrhea.     Right Sinus: No maxillary sinus tenderness or frontal sinus tenderness.     Left Sinus: No maxillary sinus tenderness or frontal sinus tenderness.     Mouth/Throat:     Mouth: Mucous membranes are not pale and not dry.     Pharynx: Uvula midline.     Comments: Cobblestoning present in the posterior oropharynx.  Eyes:     General:        Right eye: No discharge.        Left eye: No discharge.     Conjunctiva/sclera: Conjunctivae normal.     Right eye: Right conjunctiva is not injected. No chemosis.    Left eye: Left conjunctiva  is not injected. No chemosis.    Pupils: Pupils are equal, round, and reactive to light.  Cardiovascular:     Rate and Rhythm: Normal rate and regular rhythm.     Heart sounds: Normal heart sounds.  Pulmonary:     Effort: Pulmonary effort is normal. No tachypnea, accessory muscle usage or respiratory distress.     Breath sounds: Normal breath sounds. No wheezing, rhonchi or rales.     Comments: Decreased air movement at the bases. Expiratory wheezing throughout the fields.  Chest:     Chest wall: No tenderness.  Lymphadenopathy:     Cervical: No cervical adenopathy.  Skin:    Coloration: Skin is not pale.     Findings: No abrasion, erythema, petechiae or rash. Rash is not papular, urticarial or vesicular.     Comments: No eczematous or urticarial lesions noted.  Neurological:     Mental Status: He is alert.      Diagnostic studies: none (IM DepoMedrol given in clinic today)      Salvatore Marvel, MD  [facility] [place] [facility] [place]

## 2020-07-23 ENCOUNTER — Encounter: Payer: Self-pay | Admitting: Allergy & Immunology

## 2020-07-24 ENCOUNTER — Other Ambulatory Visit: Payer: Self-pay | Admitting: Registered Nurse

## 2020-07-24 ENCOUNTER — Ambulatory Visit
Admission: RE | Admit: 2020-07-24 | Discharge: 2020-07-24 | Disposition: A | Discharge status: Home / Self Care | Payer: Medicare Other | Source: Ambulatory Visit | Attending: Registered Nurse | Admitting: Registered Nurse

## 2020-07-24 ENCOUNTER — Other Ambulatory Visit: Payer: Self-pay | Admitting: Internal Medicine

## 2020-07-24 DIAGNOSIS — R1011 Right upper quadrant pain: Secondary | ICD-10-CM

## 2020-07-24 DIAGNOSIS — D72829 Elevated white blood cell count, unspecified: Secondary | ICD-10-CM

## 2020-07-24 DIAGNOSIS — R109 Unspecified abdominal pain: Secondary | ICD-10-CM

## 2020-07-29 ENCOUNTER — Telehealth: Payer: Self-pay | Admitting: *Deleted

## 2020-07-29 NOTE — Telephone Encounter (Signed)
PA was cancelled by the insurance company stating that the [employer] is on the list of covered drugs.

## 2020-07-29 NOTE — Telephone Encounter (Signed)
PA has been submitted through [employer] for [facility] and is currently pending approval or denial.

## 2020-08-07 ENCOUNTER — Telehealth: Payer: Self-pay

## 2020-08-07 NOTE — Telephone Encounter (Signed)
lmomed the pt to see if they are taking praluent 150 will await callback

## 2020-08-11 MED ORDER — PRALUENT 150 MG/ML ~~LOC~~ SOAJ: 150.0000 mg | SUBCUTANEOUS | 11 refills | Status: DC

## 2020-08-11 NOTE — Addendum Note (Signed)
Addended by: Allean Found on: 08/11/2020 02:32 PM   Modules accepted: Orders

## 2020-08-11 NOTE — Telephone Encounter (Signed)
Pt returned call stated he'd like to restart praluent rx sent and pt voiced understanding

## 2020-08-12 ENCOUNTER — Other Ambulatory Visit: Payer: Self-pay

## 2020-08-12 ENCOUNTER — Ambulatory Visit (INDEPENDENT_AMBULATORY_CARE_PROVIDER_SITE_OTHER): Payer: Medicare Other | Admitting: *Deleted

## 2020-08-12 DIAGNOSIS — J4540 Moderate persistent asthma, uncomplicated: Secondary | ICD-10-CM

## 2020-08-26 ENCOUNTER — Other Ambulatory Visit: Payer: Self-pay

## 2020-08-26 ENCOUNTER — Ambulatory Visit: Payer: Medicare Other | Admitting: Podiatry

## 2020-08-26 ENCOUNTER — Encounter: Payer: Self-pay | Admitting: Podiatry

## 2020-08-26 DIAGNOSIS — B351 Tinea unguium: Secondary | ICD-10-CM

## 2020-08-26 DIAGNOSIS — M79675 Pain in left toe(s): Secondary | ICD-10-CM

## 2020-08-26 DIAGNOSIS — M79674 Pain in right toe(s): Secondary | ICD-10-CM

## 2020-08-26 NOTE — Progress Notes (Signed)
This patient returns to the office for evaluation and treatment of long thick painful nails .  This patient is unable to trim his own nails since the patient cannot reach the feet.  Patient says the nails are painful walking and wearing his shoes.  He returns for preventive foot care services. ? ?General Appearance  Alert, conversant and in no acute stress. ? ?Vascular  Dorsalis pedis and posterior tibial  pulses are palpable  bilaterally.  Capillary return is within normal limits  bilaterally. Temperature is within normal limits  bilaterally. ? ?Neurologic  Senn-Weinstein monofilament wire test within normal limits  bilaterally. Muscle power within normal limits bilaterally. ? ?Nails Thick disfigured discolored nails with subungual debris  from hallux to fifth toes bilaterally. No evidence of bacterial infection or drainage bilaterally. ? ?Orthopedic  No limitations of motion  feet .  No crepitus or effusions noted.  No bony pathology or digital deformities noted. ? ?Skin  normotropic skin with no porokeratosis noted bilaterally.  No signs of infections or ulcers noted.    ? ?Onychomycosis  Pain in toes right foot  Pain in toes left foot ? ?Debridement  of nails  1-5  B/L with a nail nipper.  Nails were then filed using a dremel tool with no incidents.    RTC  4   months  ? ? ?Deshea Pooley DPM  ?

## 2020-09-09 ENCOUNTER — Ambulatory Visit (INDEPENDENT_AMBULATORY_CARE_PROVIDER_SITE_OTHER): Payer: Medicare Other

## 2020-09-09 DIAGNOSIS — J4540 Moderate persistent asthma, uncomplicated: Secondary | ICD-10-CM

## 2020-10-07 ENCOUNTER — Ambulatory Visit (INDEPENDENT_AMBULATORY_CARE_PROVIDER_SITE_OTHER): Payer: Medicare Other

## 2020-10-07 DIAGNOSIS — J4540 Moderate persistent asthma, uncomplicated: Secondary | ICD-10-CM | POA: Diagnosis not present

## 2020-10-16 ENCOUNTER — Ambulatory Visit (INDEPENDENT_AMBULATORY_CARE_PROVIDER_SITE_OTHER): Payer: Medicare Other | Admitting: Cardiovascular Disease

## 2020-10-16 ENCOUNTER — Encounter: Payer: Self-pay | Admitting: Cardiovascular Disease

## 2020-10-16 ENCOUNTER — Other Ambulatory Visit: Payer: Self-pay

## 2020-10-16 VITALS — BP 122/66 | HR 86 | Ht 65.0 in | Wt 215.0 lb

## 2020-10-16 DIAGNOSIS — G4733 Obstructive sleep apnea (adult) (pediatric): Secondary | ICD-10-CM | POA: Diagnosis not present

## 2020-10-16 DIAGNOSIS — I10 Essential (primary) hypertension: Secondary | ICD-10-CM

## 2020-10-16 DIAGNOSIS — E669 Obesity, unspecified: Secondary | ICD-10-CM

## 2020-10-16 DIAGNOSIS — I2510 Atherosclerotic heart disease of native coronary artery without angina pectoris: Secondary | ICD-10-CM

## 2020-10-16 DIAGNOSIS — Z6835 Body mass index (BMI) 35.0-35.9, adult: Secondary | ICD-10-CM

## 2020-10-16 DIAGNOSIS — Z9989 Dependence on other enabling machines and devices: Secondary | ICD-10-CM
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
## 2020-10-16 NOTE — Assessment & Plan Note (Signed)
History of CAD status post dominant RCA stenting by Dr. Rex Kras in 2000.  Cath performed 2011 showed a widely patent stent with 30% left main stenosis which was actually improved and normal LV function.  He denies chest pain or shortness of breath.

## 2020-10-16 NOTE — Patient Instructions (Addendum)
Medication Instructions:  Your physician recommends that you continue on your current medications as directed. Please refer to the Current Medication list given to you today.  *If you need a refill on your cardiac medications before your next appointment, please call your pharmacy*   Lab Work: Your physician recommends that you return for lab work in: 3 months for lipid/liver profile  If you have labs (blood work) drawn today and your tests are completely normal, you will receive your results only by: Marland Kitchen MyChart Message (if you have MyChart) OR . A paper copy in the mail If you have any lab test that is abnormal or we need to change your treatment, we will call you to review the results.   Follow-Up: At [facility], you and your health needs are our priority.  As part of our continuing mission to provide you with exceptional heart care, we have created designated Provider Care Teams.  These Care Teams include your primary Cardiologist (physician) and Advanced Practice Providers (APPs -  Physician Assistants and Nurse Practitioners) who all work together to provide you with the care you need, when you need it.  We recommend signing up for the patient portal called "MyChart".  Sign up information is provided on this After Visit Summary.  MyChart is used to connect with patients for Virtual Visits (Telemedicine).  Patients are able to view lab/test results, encounter notes, upcoming appointments, etc.  Non-urgent messages can be sent to your provider as well.   To learn more about what you can do with MyChart, go to [email or web address].    Your next appointment:   12 month(s)  The format for your next appointment:   In Person  Provider:   Quay Burow, MD   Other Instructions Referral made to [facility] with Dr. Dennard Nip.   [place], [place], [place] [address] [phone]

## 2020-10-16 NOTE — Assessment & Plan Note (Signed)
History of dyslipidemia intolerant to statin therapy recently begun on Repatha.  We will recheck a lipid liver profile in 3 months.

## 2020-10-19 ENCOUNTER — Telehealth: Payer: Self-pay | Admitting: Cardiovascular Disease

## 2020-10-19 NOTE — Telephone Encounter (Signed)
Spoke with patient and answered questions regarding last office visit. Patient verbalized understanding.

## 2020-10-19 NOTE — Telephone Encounter (Signed)
    Pt would like to speak with Dr. Kennon Holter nurse, he said he have questions about his appt last friday

## 2020-10-26 ENCOUNTER — Telehealth: Payer: Self-pay | Admitting: Family Medicine

## 2020-10-26 NOTE — Telephone Encounter (Signed)
Pt has called to schedule the f/u he was supposed to have had last year.  Phone rep asked if pt had any new symptoms: pt states he is unable to sleep at night and he finds himself staying in bed longer than normal in the morning.  Pt advised message being sent to RN and that she will call if she has questions for him.

## 2020-10-26 NOTE — Telephone Encounter (Signed)
Spoke to pt and he has not been seen for over a year.  So will see how cpap is doing.  Having issues with sleep (noted has had belsomra and trazodone before).

## 2020-10-27 ENCOUNTER — Encounter: Payer: Self-pay | Admitting: Family Medicine

## 2020-10-27 ENCOUNTER — Telehealth: Payer: Self-pay | Admitting: Family Medicine

## 2020-10-27 ENCOUNTER — Ambulatory Visit: Payer: Medicare Other | Admitting: Family Medicine

## 2020-10-27 VITALS — BP 134/81 | HR 80 | Ht 67.0 in | Wt 218.2 lb

## 2020-10-27 DIAGNOSIS — G4733 Obstructive sleep apnea (adult) (pediatric): Secondary | ICD-10-CM | POA: Diagnosis not present

## 2020-10-27 DIAGNOSIS — F5105 Insomnia due to other mental disorder: Secondary | ICD-10-CM | POA: Diagnosis not present

## 2020-10-27 DIAGNOSIS — G4752 REM sleep behavior disorder: Secondary | ICD-10-CM

## 2020-10-27 DIAGNOSIS — G2111 Neuroleptic induced parkinsonism: Secondary | ICD-10-CM

## 2020-10-27 DIAGNOSIS — G4710 Hypersomnia, unspecified: Secondary | ICD-10-CM

## 2020-10-27 DIAGNOSIS — Z9989 Dependence on other enabling machines and devices: Secondary | ICD-10-CM

## 2020-10-27 NOTE — Telephone Encounter (Signed)
Please fax copy of my last office note to patient's psychiatrist, Metta Clines [place]. Fax # [phone]. TY.

## 2020-10-27 NOTE — Progress Notes (Addendum)
PATIENT: Jared [name] DOB: 01-06-1951  REASON FOR VISIT: follow up HISTORY FROM: patient  Chief Complaint  Patient presents with  . Follow-up    Patient here for a cpap follow up and reports that he is doing well.      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  He is followed by Dr. Deirdre Evener, psychiatry.  Tremor .  At last follow-up 1 year ago, he reported headaches had resolved after stopping duloxetine.  He reports that duloxetine 30 mg was restarted last year for worsening depression. Headaches have not returned.   He called our office yesterday to schedule CPAP follow-up and to report concerns of difficulty staying asleep. He does not feel rested in the mornings and tends to lie in bed longer than he would like. He feels that depression is well managed. He usually goes to bed around the same time every night. He feels that he tosses and turns all night then does not feel motivated to get up the next day. He may lay in bed for 10-12 hours. He continues to act out dreams. He often wakes himself up thinking that he has poured medications in his hand to take but nothing is there. His wife continues to note that he talks and has random movements in his sleep. He reports that psychiatry asked him to discuss sleep concerns with [place]. He was on Belsomra 20mg , written by psychiatry, at last visit but is uncertain why this was stopped. He reports hallucinations in the past with Ambien. Trazodone was ineffective. He has never taken clonazepam. 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   Compliance report dated 09/26/2020 through 10/25/2020 reveals that he used CPAP 29 of the past 30 days for compliance of 97%.  He is CPAP greater than 4 hours 20 of the past 30 days for compliance of 67%.  Average usage on days used was 6 hours and 6 minutes.  Residual AHI was 1.0 on 6 to 16 cm of  water.  There was no significant leak noted.   10/17/2019 ALL:  Jared NEILAN is a 70 y.o. 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 Dr Jannifer Franklin increased duloxetine to 60mg  twice daily for headaches. Headaches continued and he reports that his PCP stopped duloxetine. Since, headaches have resolved. He continues to have intermittent shaking of left arm. Shaking can with activity or at rest. May happen several times a day or may not happen for a day or two. Shaking lasts about 2-3 minutes then resolves. He has been on Ability for 6-7 years, followed by Dr Darryl Lent, psychiatry. Tremor started shortly afterwards. She feels that he is doing well overall and not bothered by shaking. He does not feel symptoms are worsening.   She was previously on CPAP therapy but needed a new CPAP machine. New machine delivered in 09/23/2019. He continued to use his old CPAP until 10/06/2019. He is doing well with new CPAP machine. He feels that it works well and he is feeling better. Compliance report does reveal consistent use since 10/06/2019. Residual AHI 2.4 on 6-16cmH20. No significant leak noted.   HISTORY: (copied from Dr Edwena Felty note on 08/28/2019)  Jared Congress Schlosseris a 70 y.o. year old White or Caucasian male patientseen for a sleep consultation- the patient's primary neurologist is Dr Jannifer Franklin.  referralon 08/28/2019 from  PCP for a new evaluation.  Chiefconcernaccording to patient : I am getting more and more fatigued, CPAP machine is 72 years old.   I have the pleasure of seeing Jared [name] today,a right-handed White or Caucasian male with a possible sleep disorder. 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  Patient reports headaches have improved, sleepiness is progressing.  Dr Jannifer Franklin started w treatment of severe depression with Cymbalta. Once a day and he sleeps better when taking Hydralizine and Belsomra with Metta Clines.    Jared [name] was seen for a baseline diagnostic polysomnography on January 14, 2014 referred by Dr. Burnard Bunting and at the time less sleepy than today with the Epworth Sleepiness Scale endorsed at 9 out of 24 points.  Sleep efficiency was poor 70.6% and highly fragmented.  The patient had multiple hypopneas rather than apneas 66 respiratory events consisted of 55 shallow breathing spells 1 obstructive apnea and 4 central apneas and 6 mixed apneas with an AHI of 12.9.  The lowest oxygen saturation was 77% at nadir with 41 minutes of desaturation in total.  He also had frequent periodic limb movements at the time he also moved during REM sleep indicative of REM sleep behavior disorder.  He returned for CPAP titration on 06 Feb 2014 his AHI was reduced significantly and his periodic limb movements also improved.  Nadir was now 82% with only 2 minutes of desaturation time.  He started on 10 cmH2O pressure with 3 cm flex or air sense and a nasal pillow.  Normal sinus rhythm by EKG was no injury was noted.  Dr. Jannifer Franklin:  08-10-2019 History of present illness:  Jared [name] is a 70 year old right-handed white male with a history of renal cell carcinoma in the past, history of coronary artery disease, asthma, sleep apnea on CPAP, and history of depression on medications to include Abilify.  The patient claims that in early August he began having headaches in the right  frontotemporal region.  He has never had any problems with headaches at any time in his life, headaches are very unusual for him.  The patient has noted that the headaches may last 30 minutes to an hour and a half, and may come and go up to 3 times a day, sometimes waking him up out of sleep.  The patient has no nausea, vomiting or photophobia or phonophobia with the headache.  He denies any vision changes or neck pain.  He may take Tylenol with good improvement of the headache.  The headaches are usually a 5 or 6 out of 10 on a pain scale and are described as a dull achy pain.  The patient reports no sinus drainage or allergy symptoms.  He denies any numbness or weakness of the face, arms, legs.  He has noted recently some problems with tremor that will come and go involving the left upper extremity.  He denies issues controlling the bowels or the bladder.  He was sent to this office for an evaluation.  He recently had MRI of the brain that was relatively unremarkable, no source of the headache was seen.  The headaches are always on the right frontal temporal area, never anywhere else.  He does not get any redness of the eye or tearing of the eye with a headache.  The patient was placed on Lamisil around the time of onset of headache, but he ran out of his medication a week ago and he has continued to have headaches.   REVIEW OF SYSTEMS: Out of a complete 14 system review of symptoms, the patient complains only of the following symptoms, tremor left arm, headaches, depression, parasomnia, insomnia and all other reviewed systems are negative.  ESS: 8   ALLERGIES: Allergies  Allergen Reactions  . 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 Penicillins Hives, Itching and Other (See Comments)    Has patient had a PCN reaction causing immediate  rash, facial/tongue/throat swelling, SOB or lightheadedness with hypotension: Unknown Has patient had a PCN reaction causing severe rash involving mucus membranes or skin necrosis: Unknown Has patient had a PCN reaction that required hospitalization: Unknown Has patient had a PCN reaction occurring within the last 10 years: No If all of the above answers are "NO", then may proceed with Cephalosporin use.   . Sulfonamide Derivatives Hives and Itching  . Codeine Nausea Only  . Statins Other (See Comments)    Myalgias    HOME MEDICATIONS: Outpatient Medications Prior to Visit  Medication Sig Dispense Refill  . albuterol (VENTOLIN HFA) 108 (90 Base) MCG/ACT inhaler Can inhale two puffs every four to six hours as needed for cough, wheeze, shortness of breath, or chest tightness. 18 g 1  . Alirocumab (PRALUENT) 150 MG/ML SOAJ Inject 150 mg into the skin every 14 (fourteen) days. 2 mL 11  . amLODipine (NORVASC) 5 MG tablet Take 5 mg by mouth daily.    . ARIPiprazole (ABILIFY) 5 MG tablet Take 5 mg by mouth every morning.    . Armodafinil 250 MG tablet Take 250 mg by mouth daily.   0  . aspirin 325 MG tablet Take 325 mg by mouth daily. Taking daily otc    . budesonide (PULMICORT) 0.5 MG/2ML nebulizer solution Take 2 mLs (0.5 mg total) by nebulization 2 (two) times daily as needed. 240 mL 1  . DULoxetine (CYMBALTA) 30 MG capsule Take 30 mg by mouth every morning.    . finasteride (PROSCAR) 5 MG tablet Take 5 mg by mouth daily.     . fish oil-omega-3 fatty acids 1000 MG capsule Take 1 g by mouth at bedtime.     Marland Kitchen ipratropium-albuterol (DUONEB) 0.5-2.5 (3) MG/3ML SOLN Take 3 mLs by nebulization every 6 (six) hours as needed (wheezing, coughing, shortness of breath). 360 mL 1  . isosorbide mononitrate (IMDUR) 30 MG 24 hr tablet take 1 tablet by mouth once daily 15 tablet 0  . levothyroxine (SYNTHROID) 50 MCG tablet Take 50 mcg by mouth every morning.    . mometasone-formoterol (DULERA) 200-5 MCG/ACT  AERO Inhale 2 puffs into the lungs in the morning and at bedtime. with spacer and rinse mouth afterwards. 1 each 5  . montelukast (SINGULAIR) 10 MG tablet Take 10 mg by mouth at bedtime.     . Multiple Vitamin (MULTIVITAMIN) tablet Take 1 tablet by mouth at bedtime.     . silodosin (RAPAFLO) 8 MG CAPS capsule Take 8  mg by mouth at bedtime.     Marland Kitchen Spacer/Aero-Holding Chambers [facility] DIAMOND) MISC Use as directed with inhaler. 1 each 0  . telmisartan (MICARDIS) 20 MG tablet Take 20 mg by mouth daily.    . vitamin C (ASCORBIC ACID) 500 MG tablet Take 500 mg by mouth 2 (two) times daily.     [name] 150 MG injection INJECT 150MG  SUBCUTANEOUSLY EVERY 4 WEEKS (GIVEN AT MD  OFFICE) 1 each 11   Facility-Administered Medications Prior to Visit  Medication Dose Route Frequency Provider Last Rate Last Admin  . omalizumab [name]) injection 150 mg  150 mg Subcutaneous Q28 days Jiles Prows, MD   150 mg at 10/07/20 1429    PAST MEDICAL HISTORY: Past Medical History:  Diagnosis Date  . Asthma   . Cervical vertebral fusion 05/02/2014   Now presenting with C5-6 radiculopathy  . Coronary artery disease   . Depression   . Depression, prolonged 06/26/2013  . Diverticulosis   . GERD (gastroesophageal reflux disease)   . Heart attack ([place]) 2009  . History of esophageal stricture   . Hyperlipidemia   . Hypertension   . IBS (irritable bowel syndrome)   . Internal hemorrhoids   . Osteoarthritis   . 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 Peripheral neuropathy   . Renal cell carcinoma 1997   Left kidney  . 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 Shoulder pain, right   . Sleep apnea    wears CPAP  . Sleep talking   . Snoring 12/25/2013  . Wears glasses     PAST SURGICAL HISTORY: Past Surgical History:  Procedure Laterality Date  . ANTERIOR CERVICAL DECOMP/DISCECTOMY FUSION N/A 08/27/2014   Procedure: Cervical six-seven anterior cervical decompression fusion with removal of  hardware at Cervical five-six;  Surgeon: Erline Levine, MD;  Location: [facility];  Service: Neurosurgery;  Laterality: N/A;  Cervical six-seven anterior cervical decompression fusion with removal of hardware at Cervical five-six  . BACK SURGERY  1992, 2013  . CARDIAC CATHETERIZATION  05/10/2007   Minimal CAD, normal LV systolic function, medical management  . CARDIAC CATHETERIZATION  04/08/2008   RCA ulcerated 70-80% stenosis, stented with a 3x85mm Endeavor stent at 13atm for 50sec, reduced from 80% ulcerated stenosis to 0%.  . CARDIAC CATHETERIZATION  05/07/2009   50% distal left main disease-IVUS or flow wire too dangerous in particular setting to perform intervention.  Marland Kitchen CARDIAC CATHETERIZATION  03/18/2010   Medical management  . CARDIOVASCULAR STRESS TEST  02/09/2012   Normal, no significant wall abnormalities noted  . CARPAL TUNNEL RELEASE Bilateral   . COLONOSCOPY W/ BIOPSIES AND POLYPECTOMY    . CORONARY STENT PLACEMENT    . FEMUR FRACTURE SURGERY Left 1995  . HERNIA REPAIR    . NECK SURGERY  2005  . NEPHRECTOMY Left    secondary to cancer  . TOTAL KNEE ARTHROPLASTY Left 2005  . TOTAL KNEE ARTHROPLASTY Right 04/13/2018   Procedure: RIGHT TOTAL KNEE ARTHROPLASTY;  Surgeon: Netta Cedars, MD;  Location: [facility];  Service: Orthopedics;  Laterality: Right;    FAMILY HISTORY: Family History  Problem Relation Age of Onset  . Asthma Mother   . Heart disease Mother   . Hypertension Mother   . Esophageal cancer Father   . Barrett's esophagus Father   . Bone cancer Father        mets from esophagus  . Heart disease Father   . Heart disease Paternal Grandfather   . Heart  attack Paternal Grandfather 46  . Colon cancer Maternal Uncle   . Diabetes Sister   . Cancer Sister        Cervical cancer  . Stomach cancer Neg Hx     SOCIAL HISTORY: Social History   Socioeconomic History  . Marital status: Married    Spouse name: [name]  . Number of children: 1  . Years of education: 33   . Highest education level: Not on file  Occupational History  . Occupation: Financial controller. Landscape Design/horticulture    Employer: [employer]  . Occupation: OWNER    Employer: [employer]  Tobacco Use  . Smoking status: Never Smoker  . Smokeless tobacco: Never Used  Vaping Use  . Vaping Use: Never used  Substance and Sexual Activity  . Alcohol use: Not Currently  . Drug use: No  . Sexual activity: Not on file  Other Topics Concern  . Not on file  Social History Narrative   Patient is married [name]).   Patient drinks one cup of coffee but not everyday.   Patient has one child.   Patient has a college education.   Patient is right-handed.   Social Determinants of Health   Financial Resource Strain: Not on file  Food Insecurity: Not on file  Transportation Needs: Not on file  Physical Activity: Not on file  Stress: Not on file  Social Connections: Not on file  Intimate Partner Violence: Not on file      PHYSICAL EXAM  Vitals:   10/27/20 1350  BP: 134/81  Pulse: 80  Weight: 218 lb 3.2 oz (99 kg)  Height: 5\' 7"  (1.702 m)   Body mass index is 34.17 kg/m.  Generalized: Well developed, in no acute distress Cardiology: normal rate and rhythm, no murmur noted Respiratory: clear to auscultation bilaterally  Neurological examination  Mentation: Alert oriented to time, place, history taking. Follows all commands speech and language fluent Cranial nerve II-XII: Pupils were equal round reactive to light. Extraocular movements were full, visual field were full on confrontational test. Facial sensation and strength were normal. Head turning and shoulder shrug  were normal and symmetric. Motor: The motor testing reveals 5 over 5 strength of all 4 extremities. Good symmetric motor tone is noted throughout. No tremor noted on exam, no bradykinesia with fingertaps Sensory: Sensory testing is intact to soft touch on all 4 extremities. No evidence of extinction is noted.   Coordination: Cerebellar testing reveals good finger-nose-finger and heel-to-shin bilaterally.  Gait and station: Gait is normal. Slight decrease in arm swing   DIAGNOSTIC DATA (LABS, IMAGING, TESTING) - I reviewed patient records, labs, notes, testing and imaging myself where available.  No flowsheet data found.   Lab Results  Component Value Date   WBC 18.7 (H) 01/13/2020   HGB 14.1 01/13/2020   HCT 43.7 01/13/2020   MCV 87.1 01/13/2020   PLT 285 01/13/2020      Component Value Date/Time   NA 139 01/13/2020 1506   K 3.3 (L) 01/13/2020 1506   CL 100 01/13/2020 1506   CO2 23 01/13/2020 1506   GLUCOSE 177 (H) 01/13/2020 1506   BUN 23 01/13/2020 1506   CREATININE 1.23 01/13/2020 1506   CREATININE 1.41 (H) 06/25/2013 1051   CALCIUM 8.9 01/13/2020 1506   PROT 7.9 01/13/2020 1506   ALBUMIN 4.1 01/13/2020 1506   AST 28 01/13/2020 1506   ALT 33 01/13/2020 1506   ALKPHOS 81 01/13/2020 1506   BILITOT 0.5 01/13/2020 1506   GFRNONAA  59 (L) 01/13/2020 1506   GFRAA >60 01/13/2020 1506   Lab Results  Component Value Date   CHOL 226 (H) 10/16/2018   HDL 41 10/16/2018   LDLCALC 107 (H) 10/16/2018   TRIG 154 (H) 11/16/2019   CHOLHDL 5.5 (H) 10/16/2018   Lab Results  Component Value Date   HGBA1C (H) 03/17/2010    5.9 (NOTE)                                                                       According to the ADA Clinical Practice Recommendations for 2011, when HbA1c is used as a screening test:   >=6.5%   Diagnostic of Diabetes Mellitus           (if abnormal result  is confirmed)  5.7-6.4%   Increased risk of developing Diabetes Mellitus  References:Diagnosis and Classification of Diabetes Mellitus,Diabetes FWYO,3785,88(FOYDX 1):S62-S69 and Standards of Medical Care in         Diabetes - 2011,Diabetes AJOI,7867,67  (Suppl 1):S11-S61.   No results found for: VITAMINB12 Lab Results  Component Value Date   TSH 4.925 (H) 06/25/2013     ASSESSMENT AND PLAN 70 y.o. 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 here with     ICD-10-CM   1. 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 OSA on CPAP  G47.33 For home use only DME continuous positive airway pressure (CPAP)   Z99.89   3. RBD (REM behavioral disorder)  G47.52   4. Insomnia due to mental condition  F51.05   5. Hypersomnia  G47.10      Khristian is doing well. He reports headaches have resolved. He does continue to note an intermittent tremor of left arm, most likely related to Ability. We will continue to monitor symptoms closely. He continues to tolerate CPAP therapy. Daily usage compliance is excellent and 97%. 4 hour compliance is sub optimal at 67%. He was encouraged to continue using CPAP nightly and for 4 hours every night. He does continue to have difficulty with sleep. He has documented history of REM behavior history with prior sleep testing. Psychiatry was previously writing Belsomra 20mg  nightly but patient is not sure why this was discontinued. I suspect concerns of hypersomnia could have contributed. He has never tried low dose clonazepam. I have asked him to discuss with psychiatry to ensure they feel it is a safe option that will not disrupt mood management. Will have to monitor for worsening hypersomnia. Will also send to Dr Brett Fairy for review. He will continue close follow up with care team. He verbalizes understanding and agreement with  this plan.    Orders Placed This Encounter  Procedures  . For home use only DME continuous positive airway pressure (CPAP)    Supplies    Order Specific Question:   Length of Need    Answer:  Lifetime    Order Specific Question:   Patient has OSA or probable OSA    Answer:   Yes    Order Specific Question:   Is the patient currently using CPAP in the home    Answer:   Yes    Order Specific Question:   Settings    Answer:   Other see comments    Order Specific Question:   CPAP supplies needed    Answer:   Mask, headgear, cushions, filters, heated tubing and water chamber     No orders of the defined types were placed in this encounter.     I spent 30 minutes with the patient. 50% of this time was spent counseling and educating patient on plan of care and medications.     Debbora Presto, FNP-C 10/27/2020, 2:58 PM [facility] [address], [place], [place] [address] [phone]

## 2020-10-27 NOTE — Patient Instructions (Addendum)
Please continue using your CPAP regularly. While your insurance requires that you use CPAP at least 4 hours each night on 70% of the nights, I recommend, that you not skip any nights and use it throughout the night if you can. Getting used to CPAP and staying with the treatment long term does take time and patience and discipline. Untreated obstructive sleep apnea when it is moderate to severe can have an adverse impact on cardiovascular health and raise her risk for heart disease, arrhythmias, hypertension, congestive heart failure, stroke and diabetes. Untreated obstructive sleep apnea causes sleep disruption, nonrestorative sleep, and sleep deprivation. This can have an impact on your day to day functioning and cause daytime sleepiness and impairment of cognitive function, memory loss, mood disturbance, and problems focussing. Using CPAP regularly can improve these symptoms.  Continue close follow up with PCP and psychiatry. Discuss concerns of insomnia with psychiatry. We need to know two things. 1) Belsomra 20mg  last prescribed by leslie. Does she remember why it was stopped? 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 I will discuss this with Dr Brett Fairy as well.  Follow up in 1 year for CPAP compliance review.    Hypersomnia Hypersomnia is a condition in which a person feels very tired during the day even though he or she gets plenty of sleep at night. A person with this condition may take naps during the day and may find it very difficult to wake up from sleep. Hypersomnia may affect a person's ability to think, concentrate, drive, or remember things. What are the causes? The cause of this condition may not be known. Possible causes include:  Certain medicines.  Sleep disorders, such as narcolepsy and sleep apnea.  Injury to the head, brain, or spinal cord.  Drug or alcohol use.  Gastroesophageal reflux disease  (GERD).  Tumors.  Certain medical conditions, such as depression, diabetes, or an underactive thyroid gland (hypothyroidism). What are the signs or symptoms? The main symptoms of hypersomnia include:  Feeling very tired throughout the day, regardless of how much sleep you got the night before.  Having trouble waking up. Others may find it difficult to wake you up when you are sleeping.  Sleeping for longer and longer periods at a time.  Taking naps throughout the day. Other symptoms may include:  Feeling restless, anxious, or annoyed.  Lacking energy.  Having trouble with: ? Remembering. ? Speaking. ? Thinking.  Loss of appetite.  Seeing, hearing, tasting, smelling, or feeling things that are not real (hallucinations). How is this diagnosed? This condition may be diagnosed based on:  Your symptoms and medical history.  Your sleeping habits. Your health care provider may ask you to write down your sleeping habits in a daily sleep log, along with any symptoms you have.  A series of tests that are done while you sleep (sleep study or polysomnogram).  A test that measures how quickly you can fall asleep during the day (daytime nap study or multiple sleep latency test). How is this treated? Treatment can help you manage your condition. Treatment may include:  Following a regular sleep routine.  Lifestyle changes, such as changing your eating habits, getting regular exercise, and avoiding alcohol or caffeinated beverages.  Taking medicines to make you more alert (stimulants) during the day.  Treating any underlying medical causes of hypersomnia. Follow these instructions at home: Sleep routine  Schedule the same bedtime and wake-up time each day.  Practice a relaxing bedtime routine. This may include reading, meditation, deep breathing, or taking a warm bath before going to sleep.  Get regular exercise each day. Avoid strenuous exercise in the evening hours.  Keep  your sleep environment at a cooler temperature, darkened, and quiet.  Sleep with pillows and a mattress that are comfortable and supportive.  Schedule short 20-minute naps for when you feel sleepiest during the day.  Talk with your employer or teachers about your hypersomnia. If possible, adjust your schedule so that: ? You have a regular daytime work schedule. ? You can take a scheduled nap during the day. ? You do not have to work or be active at night.  Do not eat a heavy meal for a few hours before bedtime. Eat your meals at about the same times every day.  Avoid drinking alcohol or caffeinated beverages.   Safety  Do not drive or use heavy machinery if you are sleepy. Ask your health care provider if it is safe for you to drive.  Wear a life jacket when swimming or spending time near water.   General instructions  Take supplements and over-the-counter and prescription medicines only as told by your health care provider.  Keep a sleep log that will help your doctor manage your condition. This may include information about: ? What time you go to bed each night. ? How often you wake up at night. ? How many hours you sleep at night. ? How often and for how long you nap during the day. ? Any observations from others, such as leg movements during sleep, sleep walking, or snoring.  Keep all follow-up visits as told by your health care provider. This is important. Contact a health care provider if:  You have new symptoms.  Your symptoms get worse. Get help right away if:  You have serious thoughts about hurting yourself or someone else. If you ever feel like you may hurt yourself or others, or have thoughts about taking your own life, get help right away. You can go to your nearest emergency department or call:  Your local emergency services (911 in the [place]  A suicide crisis helpline, such as the [facility] at 617-737-1293. This is open 24 hours  a day. Summary  Hypersomnia refers to a condition in which you feel very tired during the day even though you get plenty of sleep at night.  A person with this condition may take naps during the day and may find it very difficult to wake up from sleep.  Hypersomnia may affect a person's ability to think, concentrate, drive, or remember things.  Treatment, such as following a regular sleep routine and making some lifestyle changes, can help you manage your condition. This information is not intended to replace advice given to you by your health care provider. Make sure you discuss any questions you have with your health care provider. Document Revised: 07/16/2020 Document Reviewed: 07/16/2020 Elsevier Patient Education  2021 [place].    Quality Sleep Information, Adult Quality sleep is important for your mental and physical health. It also improves your quality of life. Quality sleep means you:  Are asleep for most of the time you are in bed.  Fall asleep within 30 minutes.  Wake up no more than once a night.  Are awake for no longer than 20 minutes if you do wake up during the night. Most adults need 7-8 hours of quality sleep each night. How can poor sleep  affect me? If you do not get enough quality sleep, you may have:  Mood swings.  Daytime sleepiness.  Confusion.  Decreased reaction time.  Sleep disorders, such as insomnia and sleep apnea.  Difficulty with: ? Solving problems. ? Coping with stress. ? Paying attention. These issues may affect your performance and productivity at work, school, and at home. Lack of sleep may also put you at higher risk for accidents, suicide, and risky behaviors. If you do not get quality sleep you may also be at higher risk for several health problems, including:  Infections.  Type 2 diabetes.  Heart disease.  High blood pressure.  Obesity.  Worsening of long-term conditions, like arthritis, kidney disease,  depression, Parkinson's disease, and epilepsy. What actions can I take to get more quality sleep?  Stick to a sleep schedule. Go to sleep and wake up at about the same time each day. Do not try to sleep less on weekdays and make up for lost sleep on weekends. This does not work.  Try to get about 30 minutes of exercise on most days. Do not exercise 2-3 hours before going to bed.  Limit naps during the day to 30 minutes or less.  Do not use any products that contain nicotine or tobacco, such as cigarettes or e-cigarettes. If you need help quitting, ask your health care provider.  Do not drink caffeinated beverages for at least 8 hours before going to bed. Coffee, tea, and some sodas contain caffeine.  Do not drink alcohol close to bedtime.  Do not eat large meals close to bedtime.  Do not take naps in the late afternoon.  Try to get at least 30 minutes of sunlight every day. Morning sunlight is best.  Make time to relax before bed. Reading, listening to music, or taking a hot bath promotes quality sleep.  Make your bedroom a place that promotes quality sleep. Keep your bedroom dark, quiet, and at a comfortable room temperature. Make sure your bed is comfortable. Take out sleep distractions like TV, a computer, smartphone, and bright lights.  If you are lying awake in bed for longer than 20 minutes, get up and do a relaxing activity until you feel sleepy.  Work with your health care provider to treat medical conditions that may affect sleeping, such as: ? Nasal obstruction. ? Snoring. ? Sleep apnea and other sleep disorders.  Talk to your health care provider if you think any of your prescription medicines may cause you to have difficulty falling or staying asleep.  If you have sleep problems, talk with a sleep consultant. If you think you have a sleep disorder, talk with your health care provider about getting evaluated by a specialist.      Where to find more  information  [place] website: https://sleepfoundation.org  National Heart, Lung, and [place] ([place]): http://www.saunders.info/.pdf  Centers for Disease Control and Prevention (CDC): LearningDermatology.pl Contact a health care provider if you:  Have trouble getting to sleep or staying asleep.  Often wake up very early in the morning and cannot get back to sleep.  Have daytime sleepiness.  Have daytime sleep attacks of suddenly falling asleep and sudden muscle weakness (narcolepsy).  Have a tingling sensation in your legs with a strong urge to move your legs (restless legs syndrome).  Stop breathing briefly during sleep (sleep apnea).  Think you have a sleep disorder or are taking a medicine that is affecting your quality of sleep. Summary  Most adults need 7-8  hours of quality sleep each night.  Getting enough quality sleep is an important part of health and well-being.  Make your bedroom a place that promotes quality sleep and avoid things that may cause you to have poor sleep, such as alcohol, caffeine, smoking, and large meals.  Talk to your health care provider if you have trouble falling asleep or staying asleep. This information is not intended to replace advice given to you by your health care provider. Make sure you discuss any questions you have with your health care provider. Document Revised: 12/13/2017 Document Reviewed: 12/13/2017 Elsevier Patient Education  2021 [place].   Sleep Apnea Sleep apnea affects breathing during sleep. It causes breathing to stop for a short time or to become shallow. It can also increase the risk of:  Heart attack.  Stroke.  Being very overweight (obese).  Diabetes.  Heart failure.  Irregular heartbeat. The goal of treatment is to help you breathe normally again. What are the causes? There are three kinds of sleep apnea:  Obstructive sleep apnea. This is  caused by a blocked or collapsed airway.  Central sleep apnea. This happens when the brain does not send the right signals to the muscles that control breathing.  Mixed sleep apnea. This is a combination of obstructive and central sleep apnea. The most common cause of this condition is a collapsed or blocked airway. This can happen if:  Your throat muscles are too relaxed.  Your tongue and tonsils are too large.  You are overweight.  Your airway is too small.   What increases the risk?  Being overweight.  Smoking.  Having a small airway.  Being older.  Being male.  Drinking alcohol.  Taking medicines to calm yourself (sedatives or tranquilizers).  Having family members with the condition. What are the signs or symptoms?  Trouble staying asleep.  Being sleepy or tired during the day.  Getting angry a lot.  Loud snoring.  Headaches in the morning.  Not being able to focus your mind (concentrate).  Forgetting things.  Less interest in sex.  Mood swings.  Personality changes.  Feelings of sadness (depression).  Waking up a lot during the night to pee (urinate).  Dry mouth.  Sore throat. How is this diagnosed?  Your medical history.  A physical exam.  A test that is done when you are sleeping (sleep study). The test is most often done in a sleep lab but may also be done at home. How is this treated?  Sleeping on your side.  Using a medicine to get rid of mucus in your nose (decongestant).  Avoiding the use of alcohol, medicines to help you relax, or certain pain medicines (narcotics).  Losing weight, if needed.  Changing your diet.  Not smoking.  Using a machine to open your airway while you sleep, such as: ? An oral appliance. This is a mouthpiece that shifts your lower jaw forward. ? A CPAP device. This device blows air through a mask when you breathe out (exhale). ? An EPAP device. This has valves that you put in each nostril. ? A  BPAP device. This device blows air through a mask when you breathe in (inhale) and breathe out.  Having surgery if other treatments do not work. It is important to get treatment for sleep apnea. Without treatment, it can lead to:  High blood pressure.  Coronary artery disease.  In men, not being able to have an erection (impotence).  Reduced thinking ability.  Follow these instructions at home: Lifestyle  Make changes that your doctor recommends.  Eat a healthy diet.  Lose weight if needed.  Avoid alcohol, medicines to help you relax, and some pain medicines.  Do not use any products that contain nicotine or tobacco, such as cigarettes, e-cigarettes, and chewing tobacco. If you need help quitting, ask your doctor. General instructions  Take over-the-counter and prescription medicines only as told by your doctor.  If you were given a machine to use while you sleep, use it only as told by your doctor.  If you are having surgery, make sure to tell your doctor you have sleep apnea. You may need to bring your device with you.  Keep all follow-up visits as told by your doctor. This is important. Contact a doctor if:  The machine that you were given to use during sleep bothers you or does not seem to be working.  You do not get better.  You get worse. Get help right away if:  Your chest hurts.  You have trouble breathing in enough air.  You have an uncomfortable feeling in your back, arms, or stomach.  You have trouble talking.  One side of your body feels weak.  A part of your face is hanging down. These symptoms may be an emergency. Do not wait to see if the symptoms will go away. Get medical help right away. Call your local emergency services (911 in the [place] Do not drive yourself to the hospital. Summary  This condition affects breathing during sleep.  The most common cause is a collapsed or blocked airway.  The goal of treatment is to help you breathe  normally while you sleep. This information is not intended to replace advice given to you by your health care provider. Make sure you discuss any questions you have with your health care provider. Document Revised: 06/22/2018 Document Reviewed: 05/01/2018 Elsevier Patient Education  [place].

## 2020-10-28 ENCOUNTER — Other Ambulatory Visit: Payer: Self-pay

## 2020-10-28 ENCOUNTER — Encounter (INDEPENDENT_AMBULATORY_CARE_PROVIDER_SITE_OTHER): Payer: Self-pay | Admitting: Family Medicine

## 2020-10-28 ENCOUNTER — Ambulatory Visit (INDEPENDENT_AMBULATORY_CARE_PROVIDER_SITE_OTHER): Payer: Medicare Other | Admitting: Family Medicine

## 2020-10-28 VITALS — BP 101/67 | HR 74 | Temp 97.9°F | Ht 66.0 in | Wt 209.0 lb

## 2020-10-28 DIAGNOSIS — Z0289 Encounter for other administrative examinations: Secondary | ICD-10-CM

## 2020-10-28 DIAGNOSIS — E669 Obesity, unspecified: Secondary | ICD-10-CM

## 2020-10-28 DIAGNOSIS — G4733 Obstructive sleep apnea (adult) (pediatric): Secondary | ICD-10-CM | POA: Diagnosis not present

## 2020-10-28 DIAGNOSIS — E782 Mixed hyperlipidemia: Secondary | ICD-10-CM | POA: Diagnosis not present

## 2020-10-28 DIAGNOSIS — Z1331 Encounter for screening for depression: Secondary | ICD-10-CM

## 2020-10-28 DIAGNOSIS — R5383 Other fatigue: Secondary | ICD-10-CM

## 2020-10-28 DIAGNOSIS — R739 Hyperglycemia, unspecified: Secondary | ICD-10-CM

## 2020-10-28 DIAGNOSIS — E039 Hypothyroidism, unspecified: Secondary | ICD-10-CM

## 2020-10-28 DIAGNOSIS — E559 Vitamin D deficiency, unspecified: Secondary | ICD-10-CM

## 2020-10-28 DIAGNOSIS — Z6833 Body mass index (BMI) 33.0-33.9, adult: Secondary | ICD-10-CM

## 2020-10-28 NOTE — Telephone Encounter (Signed)
Fax confirmation received Dr. Patriciaann Clan [place] [phone].  (12 pgs).

## 2020-10-28 NOTE — Progress Notes (Signed)
I have read the note, and I agree with the clinical assessment and plan.  Vittorio Mohs K Arran Fessel   

## 2020-10-29 ENCOUNTER — Encounter: Payer: Self-pay | Admitting: Gastroenterology

## 2020-10-29 LAB — LIPID PANEL WITH LDL/HDL RATIO
Cholesterol, Total: 208 mg/dL — ABNORMAL HIGH (ref 100–199)
HDL: 38 mg/dL — ABNORMAL LOW (ref >39)
LDL Chol Calc (NIH): 138 mg/dL — ABNORMAL HIGH (ref 0–99)
LDL/HDL Ratio: 3.6 ratio (ref 0.0–3.6)
Triglycerides: 179 mg/dL — ABNORMAL HIGH (ref 0–149)
VLDL Cholesterol Cal: 32 mg/dL (ref 5–40)

## 2020-10-29 LAB — COMPREHENSIVE METABOLIC PANEL
ALT: 34 IU/L (ref 0–44)
AST: 25 IU/L (ref 0–40)
Albumin/Globulin Ratio: 1.4 (ref 1.2–2.2)
Albumin: 4.4 g/dL (ref 3.7–4.7)
Alkaline Phosphatase: 81 IU/L (ref 44–121)
BUN/Creatinine Ratio: 13 (ref 10–24)
BUN: 18 mg/dL (ref 8–27)
Bilirubin Total: 0.3 mg/dL (ref 0.0–1.2)
CO2: 21 mmol/L (ref 20–29)
Calcium: 9.3 mg/dL (ref 8.6–10.2)
Chloride: 104 mmol/L (ref 96–106)
Creatinine, Ser: 1.44 mg/dL — ABNORMAL HIGH (ref 0.76–1.27)
GFR calc Af Amer: 56 mL/min/{1.73_m2} — ABNORMAL LOW (ref >59)
GFR calc non Af Amer: 49 mL/min/{1.73_m2} — ABNORMAL LOW (ref >59)
Globulin, Total: 3.2 g/dL (ref 1.5–4.5)
Glucose: 97 mg/dL (ref 65–99)
Potassium: 4.7 mmol/L (ref 3.5–5.2)
Sodium: 141 mmol/L (ref 134–144)
Total Protein: 7.6 g/dL (ref 6.0–8.5)

## 2020-10-29 LAB — CBC WITH DIFFERENTIAL/PLATELET
Basophils Absolute: 0.1 10*3/uL (ref 0.0–0.2)
Basos: 1 %
EOS (ABSOLUTE): 0.4 10*3/uL (ref 0.0–0.4)
Eos: 5 %
Hematocrit: 45.1 % (ref 37.5–51.0)
Hemoglobin: 14.6 g/dL (ref 13.0–17.7)
Immature Grans (Abs): 0.1 10*3/uL (ref 0.0–0.1)
Immature Granulocytes: 1 %
Lymphocytes Absolute: 1.9 10*3/uL (ref 0.7–3.1)
Lymphs: 24 %
MCH: 27.2 pg (ref 26.6–33.0)
MCHC: 32.4 g/dL (ref 31.5–35.7)
MCV: 84 fL (ref 79–97)
Monocytes Absolute: 0.7 10*3/uL (ref 0.1–0.9)
Monocytes: 9 %
Neutrophils Absolute: 4.9 10*3/uL (ref 1.4–7.0)
Neutrophils: 60 %
Platelets: 298 10*3/uL (ref 150–450)
RBC: 5.37 x10E6/uL (ref 4.14–5.80)
RDW: 15.7 % — ABNORMAL HIGH (ref 11.6–15.4)
WBC: 8.1 10*3/uL (ref 3.4–10.8)

## 2020-10-29 LAB — VITAMIN D 25 HYDROXY (VIT D DEFICIENCY, FRACTURES): Vit D, 25-Hydroxy: 29.3 ng/mL — ABNORMAL LOW (ref 30.0–100.0)

## 2020-10-29 LAB — TSH: TSH: 2.91 u[IU]/mL (ref 0.450–4.500)

## 2020-10-29 LAB — FOLATE: Folate: 12 ng/mL (ref >3.0)

## 2020-10-29 LAB — HEMOGLOBIN A1C
Est. average glucose Bld gHb Est-mCnc: 134 mg/dL
Hgb A1c MFr Bld: 6.3 % — ABNORMAL HIGH (ref 4.8–5.6)

## 2020-10-29 LAB — INSULIN, RANDOM: INSULIN: 33.6 u[IU]/mL — ABNORMAL HIGH (ref 2.6–24.9)

## 2020-10-29 LAB — T3: T3, Total: 108 ng/dL (ref 71–180)

## 2020-10-29 LAB — VITAMIN B12: Vitamin B-12: 776 pg/mL (ref 232–1245)

## 2020-10-29 LAB — T4: T4, Total: 8.3 ug/dL (ref 4.5–12.0)

## 2020-10-29 NOTE — Progress Notes (Signed)
Cm sent to [facility]

## 2020-11-02 NOTE — Progress Notes (Signed)
Chief Complaint:   Jared [name] (MR# [id number]) is a 70 y.o. male who presents for evaluation and treatment of Jared and related comorbidities. Current BMI is Body mass index is 33.73 kg/m. Jared [name] has been struggling with his weight for many years and has been unsuccessful in either losing weight, maintaining weight loss, or reaching his healthy weight goal.  Jared [name] is currently in the action stage of change and ready to dedicate time achieving and maintaining a healthier weight. Jared [name] is interested in becoming our patient and working on intensive lifestyle modifications including (but not limited to) diet and exercise for weight loss.  Jared [name]'s habits were reviewed today and are as follows: His family eats meals together, he thinks his family will eat healthier with him, his desired weight loss is 29 lbs, he started gaining weight in 2021, his heaviest weight ever was 215 pounds, he has significant food cravings issues, he snacks frequently in the evenings, he skips meals frequently, he is frequently drinking liquids with calories, he frequently eats larger portions than normal and he struggles with emotional eating.  Depression Screen Jared [name]'s Food and Mood (modified PHQ-9) score was 8.  Depression screen PHQ 2/9 10/28/2020  Decreased Interest 1  Down, Depressed, Hopeless 1  PHQ - 2 Score 2  Altered sleeping 2  Tired, decreased energy 2  Change in appetite 1  Feeling bad or failure about yourself  1  Trouble concentrating 0  Moving slowly or fidgety/restless 0  Suicidal thoughts 0  PHQ-9 Score 8  Difficult doing work/chores Somewhat difficult  Some recent data might be hidden   Subjective:   1. Other fatigue [name] admits to daytime somnolence and admits to waking up still tired. Patent has a history of symptoms of daytime fatigue. Jared [name] generally gets 1 or 3 hours of sleep per night, and states that he has nightime awakenings. Snoring is present. Apneic episodes  are present. Epworth Sleepiness Score is 10.  2. OSA (obstructive sleep apnea) Jared [name] has a history of sleep apnea. He sleeps only 2-3 hours per night and wakes up tired. He uses his CPAP nightly. He is followed by his sleep doctor.  3. Mixed hyperlipidemia Jared [name] is ready to work on diet, exercise, and weight loss to improve his cholesterol level.  4. Hyperglycemia Jared [name] has a history of multiple elevated glucose readings for the last few years. His glucose in 12/2019 was 177, and it is unknown if it was fasting or not.  5. Acquired hypothyroidism Jared [name] is on Synthroid, and he notes fatigue.  6. Vitamin D deficiency Jared [name] is at high risk of Vit D deficiency. He has no recent labs.  Assessment/Plan:   1. Other fatigue Jared [name] does feel that his weight is causing his energy to be lower than it should be. Fatigue may be related to Jared, depression or many other causes. Labs will be ordered, and in the meanwhile, Jared [name] will focus on self care including making healthy food choices, increasing physical activity and focusing on stress reduction.  - CBC with Differential/Platelet - Comprehensive metabolic panel  2. OSA (obstructive sleep apnea) Intensive lifestyle modifications are the first line treatment for this issue. We discussed several lifestyle modifications today. Jared [name] will continue his CPAP, and will continue to work on weight loss and we will continue to monitor. Orders and follow up as documented in patient record.   3. Mixed hyperlipidemia Cardiovascular risk and specific lipid/LDL goals reviewed. We discussed several lifestyle modifications today. We will  check labs today. Jared [name] will start his Category 2 plan, and will continue to work on exercise and weight loss efforts. Orders and follow up as documented in patient record.   - Lipid Panel With LDL/HDL Ratio  4. Hyperglycemia Fasting labs will be obtained today, and results with be discussed with Jared [name] in 2 weeks at his  follow up visit. In the meanwhile Jared [name] will start his Category 2 plan and will work on weight loss efforts.  - Hemoglobin A1c - Insulin, random  5. Acquired hypothyroidism We will check labs today, and Jared [name] will continue to follow up as directed. Orders and follow up as documented in patient record.  - T3 - T4 - TSH  6. Vitamin D deficiency Low Vitamin D level contributes to fatigue and are associated with Jared, breast, and colon cancer. We will check labs today. Jared [name] will follow-up for routine testing of Vitamin D, at least 2-3 times per year to avoid over-replacement.  - Vitamin B12 - Folate - VITAMIN D 25 Hydroxy (Vit-D Deficiency, Fractures)  7. Screening for depression Jared [name] had a positive depression screening. Depression is commonly associated with Jared and often results in emotional eating behaviors. We will monitor this closely and work on CBT to help improve the non-hunger eating patterns. Referral to Psychology may be required if no improvement is seen as he continues in our clinic.  8. Class 1 Jared with serious comorbidity and body mass index (BMI) of 33.0 to 33.9 in adult, unspecified Jared type Jared [name] is currently in the action stage of change and his goal is to continue with weight loss efforts. I recommend Jared [name] begin the structured treatment plan as follows:  He has agreed to the Category 2 Plan.  Exercise goals: No exercise has been prescribed for now, while we concentrate on nutritional changes.  Behavioral modification strategies: increasing lean protein intake and no skipping meals.  He was informed of the importance of frequent follow-up visits to maximize his success with intensive lifestyle modifications for his multiple health conditions. He was informed we would discuss his lab results at his next visit unless there is a critical issue that needs to be addressed sooner. Jared [name] agreed to keep his next visit at the agreed upon time to discuss these  results.  Objective:   Blood pressure 101/67, pulse 74, temperature 97.9 F (36.6 C), height 5\' 6"  (1.676 m), weight 209 lb (94.8 kg), SpO2 97 %. Body mass index is 33.73 kg/m.  EKG: Normal sinus rhythm, rate 86 BPM.  Indirect Calorimeter completed today shows a VO2 of 193 and a REE of 1345.  His calculated basal metabolic rate is 2500 thus his basal metabolic rate is worse than expected.  General: Cooperative, alert, well developed, in no acute distress. HEENT: Conjunctivae and lids unremarkable. Cardiovascular: Regular rhythm.  Lungs: Normal work of breathing. Neurologic: No focal deficits.   Lab Results  Component Value Date   CREATININE 1.44 (H) 10/28/2020   BUN 18 10/28/2020   NA 141 10/28/2020   K 4.7 10/28/2020   CL 104 10/28/2020   CO2 21 10/28/2020   Lab Results  Component Value Date   ALT 34 10/28/2020   AST 25 10/28/2020   ALKPHOS 81 10/28/2020   BILITOT 0.3 10/28/2020   Lab Results  Component Value Date   HGBA1C 6.3 (H) 10/28/2020   HGBA1C (H) 03/17/2010    5.9 (NOTE)  According to the ADA Clinical Practice Recommendations for 2011, when HbA1c is used as a screening test:   >=6.5%   Diagnostic of Diabetes Mellitus           (if abnormal result  is confirmed)  5.7-6.4%   Increased risk of developing Diabetes Mellitus  References:Diagnosis and Classification of Diabetes Mellitus,Diabetes XBMW,4132,44(WNUUV 1):S62-S69 and Standards of Medical Care in         Diabetes - 2011,Diabetes OZDG,6440,34  (Suppl 1):S11-S61.   HGBA1C  04/08/2008    6.0 (NOTE)   The ADA recommends the following therapeutic goal for glycemic   control related to Hgb A1C measurement:   Goal of Therapy:   < 7.0% Hgb A1C   Reference: American Diabetes Association: Clinical Practice   Recommendations 2008, Diabetes Care,  2008, 31:(Suppl 1).   Lab Results  Component Value Date   INSULIN 33.6 (H) 10/28/2020   Lab Results   Component Value Date   TSH 2.910 10/28/2020   Lab Results  Component Value Date   CHOL 208 (H) 10/28/2020   HDL 38 (L) 10/28/2020   LDLCALC 138 (H) 10/28/2020   Jared [name] 179 (H) 10/28/2020   CHOLHDL 5.5 (H) 10/16/2018   Lab Results  Component Value Date   WBC 8.1 10/28/2020   HGB 14.6 10/28/2020   HCT 45.1 10/28/2020   MCV 84 10/28/2020   PLT 298 10/28/2020   Lab Results  Component Value Date   FERRITIN 334 11/20/2019   Jared Behavioral Intervention:   Approximately 15 minutes were spent on the discussion below.  ASK: We discussed the diagnosis of Jared with Jared [name] today and Jared [name] agreed to give [place] permission to discuss Jared behavioral modification therapy today.  ASSESS: Malikhi has the diagnosis of Jared and his BMI today is 33.75. Cristian is in the action stage of change.   ADVISE: Benford was educated on the multiple health risks of Jared as well as the benefit of weight loss to improve his health. He was advised of the need for long term treatment and the importance of lifestyle modifications to improve his current health and to decrease his risk of future health problems.  AGREE: Multiple dietary modification options and treatment options were discussed and [place]ton agreed to follow the recommendations documented in the above note.  ARRANGE: Tajai was educated on the importance of frequent visits to treat Jared as outlined per CMS and USPSTF guidelines and agreed to schedule his next follow up appointment today.  Attestation Statements:   Reviewed by clinician on day of visit: allergies, medications, problem list, medical history, surgical history, family history, social history, and previous encounter notes.   I, Trixie Dredge, am acting as transcriptionist for Dennard Nip, MD.  I have reviewed the above documentation for accuracy and completeness, and I agree with the above. - Dennard Nip, MD

## 2020-11-04 ENCOUNTER — Ambulatory Visit (INDEPENDENT_AMBULATORY_CARE_PROVIDER_SITE_OTHER): Payer: Medicare Other | Admitting: *Deleted

## 2020-11-04 ENCOUNTER — Other Ambulatory Visit: Payer: Self-pay

## 2020-11-04 DIAGNOSIS — J4540 Moderate persistent asthma, uncomplicated: Secondary | ICD-10-CM

## 2020-11-11 ENCOUNTER — Other Ambulatory Visit: Payer: Self-pay

## 2020-11-11 ENCOUNTER — Ambulatory Visit (INDEPENDENT_AMBULATORY_CARE_PROVIDER_SITE_OTHER): Payer: Medicare Other | Admitting: Family Medicine

## 2020-11-11 ENCOUNTER — Encounter (INDEPENDENT_AMBULATORY_CARE_PROVIDER_SITE_OTHER): Payer: Self-pay | Admitting: Family Medicine

## 2020-11-11 VITALS — BP 94/59 | HR 69 | Temp 97.8°F | Ht 66.0 in | Wt 210.0 lb

## 2020-11-11 DIAGNOSIS — E669 Obesity, unspecified: Secondary | ICD-10-CM

## 2020-11-11 DIAGNOSIS — R7303 Prediabetes: Secondary | ICD-10-CM

## 2020-11-11 DIAGNOSIS — E782 Mixed hyperlipidemia: Secondary | ICD-10-CM | POA: Diagnosis not present

## 2020-11-11 DIAGNOSIS — Z6834 Body mass index (BMI) 34.0-34.9, adult: Secondary | ICD-10-CM

## 2020-11-11 DIAGNOSIS — E559 Vitamin D deficiency, unspecified: Secondary | ICD-10-CM | POA: Diagnosis not present

## 2020-11-11 DIAGNOSIS — N189 Chronic kidney disease, unspecified: Secondary | ICD-10-CM

## 2020-11-11 MED ORDER — VITAMIN D (ERGOCALCIFEROL) 1.25 MG (50000 UNIT) PO CAPS: 50000.0000 [IU] | ORAL_CAPSULE | ORAL | 0 refills | Status: DC

## 2020-11-12 NOTE — Progress Notes (Signed)
Chief Complaint:   OBESITY Jared [name] is here to discuss his progress with his obesity treatment plan along with follow-up of his obesity related diagnoses. Jared [name] is on the Category 2 Plan and states he is following his eating plan approximately 75-80% of the time. Jared [name] states he is going to the gym and swimming for 30-120 minutes 5 times per week.  Today's visit was #: 2 Starting weight: 209 lbs Starting date: 10/28/2020 Today's weight: 210 lbs Today's date: 11/11/2020 Total lbs lost to date: 0 Total lbs lost since last in-office visit: 0  Interim History: Jared [name] did well on his plan for breakfast and lunch, but he struggled with eating out frequently for dinner. He is doing very well with exercise.  Subjective:   1. Mixed hyperlipidemia Jared [name]'s HDL is low, and LDL and triglycerides are elevated. He is not on statin, and he is working on diet and weight loss. He denies chest pain. I discussed labs with the patient today.  2. 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 His GFR decreased and creatinine increased.  3. Vitamin D deficiency Jared [name] is on multivitamins but his Vit D level is not yet at goal. He notes fatigue. I discussed labs with the patient today.  4. Pre-diabetes Jared [name]'s A1c is elevated at 6.3, and he is getting close to diabetes mellitus. I discussed labs with the patient today.  Assessment/Plan:   1. Mixed hyperlipidemia Cardiovascular risk and specific lipid/LDL goals reviewed. We discussed several lifestyle modifications today. Jared [name] will continue to work on diet, exercise and weight loss efforts. We will recheck labs in 3 months. Orders and follow up as documented in patient record.   Counseling Intensive lifestyle modifications are the first line treatment for this issue. . Dietary changes: Increase soluble fiber. Decrease simple carbohydrates. . Exercise changes: Moderate to  vigorous-intensity aerobic activity 150 minutes per week if tolerated. . Lipid-lowering medications: see documented in medical record.  2. Chronic renal impairment, unspecified CKD stage Jared [name] was encouraged to increase his water intake until his urine is close to clear, and we will recheck labs in 3 months.  3. Vitamin D deficiency Low Vitamin D level contributes to fatigue and are associated with obesity, breast, and colon cancer. Jared [name] agreed to start prescription Vitamin D 50,000 IU every week with no refills. He will follow-up for routine testing of Vitamin D, at least 2-3 times per year to avoid over-replacement.  - Vitamin D, Ergocalciferol, (DRISDOL) 1.25 MG (50000 UNIT) CAPS capsule; Take 1 capsule (50,000 Units total) by mouth every 7 (seven) days.  Dispense: 4 capsule; Refill: 0  4. Pre-diabetes Jared [name] will continue to work on weight loss, exercise, and decreasing simple carbohydrates to help decrease the risk of diabetes. Jared [name] was educated on pre-diabetes and the importance of diet and weight loss. We will defer metformin due to renal history.  5. Class 1 obesity with serious comorbidity and body mass index (BMI) of 34.0 to 34.9 in adult, unspecified obesity type Jared [name] is currently in the action stage of change. As such, his goal is to continue with weight loss efforts. He has agreed to the Category 2 Plan.   Jared [name] given.  Exercise goals: As is.  Behavioral modification strategies: increasing lean protein intake and meal planning and cooking strategies.  Jared [name] has agreed to follow-up with our clinic in 2 weeks. He was informed of the importance of frequent follow-up visits to maximize his success  with intensive lifestyle modifications for his multiple health conditions.   Objective:   Blood pressure (!) 94/59, pulse 69, temperature 97.8 F (36.6 C), height 5\' 6"  (1.676 m), weight 210 lb (95.3 kg), SpO2 98 %. Body mass index is 33.89 kg/m.  General:  Cooperative, alert, well developed, in no acute distress. HEENT: Conjunctivae and lids unremarkable. Cardiovascular: Regular rhythm.  Lungs: Normal work of breathing. Neurologic: No focal deficits.   Lab Results  Component Value Date   CREATININE 1.44 (H) 10/28/2020   BUN 18 10/28/2020   NA 141 10/28/2020   K 4.7 10/28/2020   CL 104 10/28/2020   CO2 21 10/28/2020   Lab Results  Component Value Date   ALT 34 10/28/2020   AST 25 10/28/2020   ALKPHOS 81 10/28/2020   BILITOT 0.3 10/28/2020   Lab Results  Component Value Date   HGBA1C 6.3 (H) 10/28/2020   HGBA1C (H) 03/17/2010    5.9 (NOTE)                                                                       According to the ADA Clinical Practice Recommendations for 2011, when HbA1c is used as a screening test:   >=6.5%   Diagnostic of Diabetes Mellitus           (if abnormal result  is confirmed)  5.7-6.4%   Increased risk of developing Diabetes Mellitus  References:Diagnosis and Classification of Diabetes Mellitus,Diabetes AYTK,1601,09(NATFT 1):S62-S69 and Standards of Medical Care in         Diabetes - 2011,Diabetes Care,2011,34  (Suppl 1):S11-S61.   HGBA1C  04/08/2008    6.0 (NOTE)   The ADA recommends the following therapeutic goal for glycemic   control related to Hgb A1C measurement:   Goal of Therapy:   < 7.0% Hgb A1C   Reference: American Diabetes Association: Clinical Practice   Recommendations 2008, Diabetes Care,  2008, 31:(Suppl 1).   Lab Results  Component Value Date   INSULIN 33.6 (H) 10/28/2020   Lab Results  Component Value Date   TSH 2.910 10/28/2020   Lab Results  Component Value Date   CHOL 208 (H) 10/28/2020   HDL 38 (L) 10/28/2020   LDLCALC 138 (H) 10/28/2020   TRIG 179 (H) 10/28/2020   CHOLHDL 5.5 (H) 10/16/2018   Lab Results  Component Value Date   WBC 8.1 10/28/2020   HGB 14.6 10/28/2020   HCT 45.1 10/28/2020   MCV 84 10/28/2020   PLT 298 10/28/2020   Lab Results  Component Value Date    FERRITIN 334 11/20/2019    Obesity Behavioral Intervention:   Approximately 15 minutes were spent on the discussion below.  ASK: We discussed the diagnosis of obesity with Jared [name] today and Jared [name] agreed to give [place] permission to discuss obesity behavioral modification therapy today.  ASSESS: Jared [name] has the diagnosis of obesity and his BMI today is 33.91. Jared [name] is in the action stage of change.   ADVISE: Jared [name] was educated on the multiple health risks of obesity as well as the benefit of weight loss to improve his health. He was advised of the need for long term treatment and the importance of lifestyle modifications to improve his current health and to decrease his  risk of future health problems.  AGREE: Multiple dietary modification options and treatment options were discussed and Jared [name] agreed to follow the recommendations documented in the above note.  ARRANGE: Jared [name] was educated on the importance of frequent visits to treat obesity as outlined per CMS and USPSTF guidelines and agreed to schedule his next follow up appointment today.  Attestation Statements:   Reviewed by clinician on day of visit: allergies, medications, problem list, medical history, surgical history, family history, social history, and previous encounter notes.   I, Trixie Dredge, am acting as transcriptionist for Dennard Nip, MD.  I have reviewed the above documentation for accuracy and completeness, and I agree with the above. -  Dennard Nip, MD

## 2020-11-25 ENCOUNTER — Ambulatory Visit (HOSPITAL_COMMUNITY)
Admission: RE | Admit: 2020-11-25 | Discharge: 2020-11-25 | Disposition: A | Discharge status: Home / Self Care | Payer: Medicare Other | Source: Ambulatory Visit | Attending: Internal Medicine | Admitting: Internal Medicine

## 2020-11-25 ENCOUNTER — Other Ambulatory Visit: Payer: Self-pay | Admitting: Internal Medicine

## 2020-11-25 ENCOUNTER — Other Ambulatory Visit (HOSPITAL_COMMUNITY): Payer: Self-pay | Admitting: Internal Medicine

## 2020-11-25 ENCOUNTER — Other Ambulatory Visit: Payer: Self-pay

## 2020-11-25 DIAGNOSIS — R413 Other amnesia: Secondary | ICD-10-CM | POA: Diagnosis not present

## 2020-11-25 DIAGNOSIS — R29818 Other symptoms and signs involving the nervous system: Secondary | ICD-10-CM | POA: Diagnosis present

## 2020-11-25 MED ORDER — GADOBUTROL 1 MMOL/ML IV SOLN
9.0000 mL | Freq: Once | INTRAVENOUS | Status: AC | PRN
  Administered 2020-11-25: 9 mL via INTRAVENOUS

## 2020-11-26 ENCOUNTER — Ambulatory Visit (INDEPENDENT_AMBULATORY_CARE_PROVIDER_SITE_OTHER): Payer: Medicare Other | Admitting: Family Medicine

## 2020-11-30 ENCOUNTER — Telehealth: Payer: Self-pay | Admitting: Family Medicine

## 2020-11-30 NOTE — Telephone Encounter (Signed)
Pt. has been scheduled to be seen sooner by NP as he is having tremors.

## 2020-12-01 NOTE — Telephone Encounter (Signed)
Scheduled in 02/2021, placed on my list for any cancellations. (pt last seen 10-27-20).

## 2020-12-02 ENCOUNTER — Ambulatory Visit (INDEPENDENT_AMBULATORY_CARE_PROVIDER_SITE_OTHER): Payer: Medicare Other

## 2020-12-02 ENCOUNTER — Other Ambulatory Visit: Payer: Self-pay

## 2020-12-02 DIAGNOSIS — J4540 Moderate persistent asthma, uncomplicated: Secondary | ICD-10-CM

## 2020-12-08 ENCOUNTER — Ambulatory Visit (INDEPENDENT_AMBULATORY_CARE_PROVIDER_SITE_OTHER): Payer: Medicare Other | Admitting: Family Medicine

## 2020-12-08 ENCOUNTER — Other Ambulatory Visit: Payer: Self-pay

## 2020-12-08 ENCOUNTER — Other Ambulatory Visit (INDEPENDENT_AMBULATORY_CARE_PROVIDER_SITE_OTHER): Payer: Self-pay | Admitting: Family Medicine

## 2020-12-08 ENCOUNTER — Encounter (INDEPENDENT_AMBULATORY_CARE_PROVIDER_SITE_OTHER): Payer: Self-pay | Admitting: Family Medicine

## 2020-12-08 VITALS — BP 138/83 | HR 78 | Temp 97.7°F | Ht 66.0 in | Wt 204.0 lb

## 2020-12-08 DIAGNOSIS — E669 Obesity, unspecified: Secondary | ICD-10-CM

## 2020-12-08 DIAGNOSIS — Z9989 Dependence on other enabling machines and devices: Secondary | ICD-10-CM

## 2020-12-08 DIAGNOSIS — Z6833 Body mass index (BMI) 33.0-33.9, adult: Secondary | ICD-10-CM | POA: Diagnosis not present

## 2020-12-08 DIAGNOSIS — G4733 Obstructive sleep apnea (adult) (pediatric): Secondary | ICD-10-CM

## 2020-12-08 DIAGNOSIS — E559 Vitamin D deficiency, unspecified: Secondary | ICD-10-CM | POA: Diagnosis not present

## 2020-12-08 DIAGNOSIS — E66811 Obesity, class 1: Secondary | ICD-10-CM

## 2020-12-08 MED ORDER — VITAMIN D (ERGOCALCIFEROL) 1.25 MG (50000 UNIT) PO CAPS: 50000.0000 [IU] | ORAL_CAPSULE | ORAL | 0 refills | Status: DC

## 2020-12-09 ENCOUNTER — Encounter (INDEPENDENT_AMBULATORY_CARE_PROVIDER_SITE_OTHER): Payer: Self-pay | Admitting: Family Medicine

## 2020-12-09 DIAGNOSIS — Z6833 Body mass index (BMI) 33.0-33.9, adult: Secondary | ICD-10-CM | POA: Insufficient documentation

## 2020-12-09 DIAGNOSIS — E669 Obesity, unspecified: Secondary | ICD-10-CM | POA: Insufficient documentation

## 2020-12-09 DIAGNOSIS — E559 Vitamin D deficiency, unspecified: Secondary | ICD-10-CM | POA: Insufficient documentation

## 2020-12-09 NOTE — Progress Notes (Signed)
Chief Complaint:   OBESITY Jared [name] is here to discuss his progress with his obesity treatment plan along with follow-up of his obesity related diagnoses. Nayson is on the Category 2 Plan and states he is following his eating plan approximately 90% of the time. Kaylem states he is doing cardio and resistance training 60-120 minutes 3 times per week.  Today's visit was #: 3 Starting weight: 209 lbs Starting date: 10/28/2020 Today's weight: 204 lbs Today's date: 12/08/2020 Total lbs lost to date: 5 lbs Total lbs lost since last in-office visit: 6 lbs  Interim History: Andry has reduced eating out over the past few weeks to 2 times a week. When he eats out, he is trying to stick to the plan. He has continued to exercise, which has always been a habit for him.   He notes hunger is satisfied. He enjoys all of the food on the plan.  Subjective:   1. Vitamin D deficiency Liev's Vitamin D level was 29.3 on 10/28/2020. He is currently taking prescription vitamin D 50,000 IU each week.    Ref. Range 10/28/2020 11:27  Vitamin D, 25-Hydroxy Latest Ref Range: 30.0 - 100.0 ng/mL 29.3 (L)   2. OSA (obstructive sleep apnea) Kipling wears a CPAP 6-8 hours a night. He feels refreshed after sleeping.  Assessment/Plan:   1. Vitamin D deficiency . He agrees to continue to take prescription Vitamin D @50 ,000 IU every week and will follow-up for routine testing of Vitamin D, at least 2-3 times per year to avoid over-replacement.  - Vitamin D, Ergocalciferol, (DRISDOL) 1.25 MG (50000 UNIT) CAPS capsule; Take 1 capsule (50,000 Units total) by mouth every 7 (seven) days.  Dispense: 4 capsule; Refill: 0  2. OSA (obstructive sleep apnea)  Continue CPAP use nightly.  3. Class 1 obesity with serious comorbidity and body mass index (BMI) of 33.0 to 33.9 in adult, unspecified obesity type Kaien is currently in the action stage of change. As such, his goal is to continue with weight loss efforts. He has agreed to  the Category 2 Plan.   Exercise goals: As is  Behavioral modification strategies: planning for success.  Harless has agreed to follow-up with our clinic in 2 weeks.   Objective:   Blood pressure 138/83, pulse 78, temperature 97.7 F (36.5 C), height 5\' 6"  (1.676 m), weight 204 lb (92.5 kg), SpO2 98 %. Body mass index is 32.93 kg/m.  General: Cooperative, alert, well developed, in no acute distress. HEENT: Conjunctivae and lids unremarkable. Cardiovascular: Regular rhythm.  Lungs: Normal work of breathing. Neurologic: No focal deficits.   Lab Results  Component Value Date   CREATININE 1.44 (H) 10/28/2020   BUN 18 10/28/2020   NA 141 10/28/2020   K 4.7 10/28/2020   CL 104 10/28/2020   CO2 21 10/28/2020   Lab Results  Component Value Date   ALT 34 10/28/2020   AST 25 10/28/2020   ALKPHOS 81 10/28/2020   BILITOT 0.3 10/28/2020   Lab Results  Component Value Date   HGBA1C 6.3 (H) 10/28/2020   HGBA1C (H) 03/17/2010    5.9 (NOTE)  According to the ADA Clinical Practice Recommendations for 2011, when HbA1c is used as a screening test:   >=6.5%   Diagnostic of Diabetes Mellitus           (if abnormal result  is confirmed)  5.7-6.4%   Increased risk of developing Diabetes Mellitus  References:Diagnosis and Classification of Diabetes Mellitus,Diabetes WNIO,2703,50(KXFGH 1):S62-S69 and Standards of Medical Care in         Diabetes - 2011,Diabetes WEXH,3716,96  (Suppl 1):S11-S61.   HGBA1C  04/08/2008    6.0 (NOTE)   The ADA recommends the following therapeutic goal for glycemic   control related to Hgb A1C measurement:   Goal of Therapy:   < 7.0% Hgb A1C   Reference: American Diabetes Association: Clinical Practice   Recommendations 2008, Diabetes Care,  2008, 31:(Suppl 1).   Lab Results  Component Value Date   INSULIN 33.6 (H) 10/28/2020   Lab Results  Component Value Date   TSH 2.910 10/28/2020   Lab  Results  Component Value Date   CHOL 208 (H) 10/28/2020   HDL 38 (L) 10/28/2020   LDLCALC 138 (H) 10/28/2020   TRIG 179 (H) 10/28/2020   CHOLHDL 5.5 (H) 10/16/2018   Lab Results  Component Value Date   WBC 8.1 10/28/2020   HGB 14.6 10/28/2020   HCT 45.1 10/28/2020   MCV 84 10/28/2020   PLT 298 10/28/2020   Lab Results  Component Value Date   FERRITIN 334 11/20/2019    Obesity Behavioral Intervention:   Approximately 15 minutes were spent on the discussion below.  ASK: We discussed the diagnosis of obesity with Shanon Brow today and Shanon Brow agreed to give [place] permission to discuss obesity behavioral modification therapy today.  ASSESS: Aksel has the diagnosis of obesity and his BMI today is 33.0. Edison is in the action stage of change.   ADVISE: Seabron was educated on the multiple health risks of obesity as well as the benefit of weight loss to improve his health. He was advised of the need for long term treatment and the importance of lifestyle modifications to improve his current health and to decrease his risk of future health problems.  AGREE: Multiple dietary modification options and treatment options were discussed and Tranquilino agreed to follow the recommendations documented in the above note.  ARRANGE: Annette was educated on the importance of frequent visits to treat obesity as outlined per CMS and USPSTF guidelines and agreed to schedule his next follow up appointment today.  Attestation Statements:   Reviewed by clinician on day of visit: allergies, medications, problem list, medical history, surgical history, family history, social history, and previous encounter notes.  Coral Ceo, am acting as Location manager for [employer], [place].  I have reviewed the above documentation for accuracy and completeness, and I agree with the above. -  Georgianne Fick, FNP

## 2020-12-22 ENCOUNTER — Ambulatory Visit (INDEPENDENT_AMBULATORY_CARE_PROVIDER_SITE_OTHER): Payer: Medicare Other | Admitting: Family Medicine

## 2020-12-22 ENCOUNTER — Other Ambulatory Visit: Payer: Self-pay

## 2020-12-22 ENCOUNTER — Encounter (INDEPENDENT_AMBULATORY_CARE_PROVIDER_SITE_OTHER): Payer: Self-pay | Admitting: Family Medicine

## 2020-12-22 VITALS — BP 125/80 | HR 76 | Temp 97.7°F | Ht 66.0 in | Wt 205.0 lb

## 2020-12-22 DIAGNOSIS — R7303 Prediabetes: Secondary | ICD-10-CM | POA: Diagnosis not present

## 2020-12-22 DIAGNOSIS — E669 Obesity, unspecified: Secondary | ICD-10-CM

## 2020-12-22 DIAGNOSIS — Z6833 Body mass index (BMI) 33.0-33.9, adult: Secondary | ICD-10-CM | POA: Diagnosis not present

## 2020-12-22 DIAGNOSIS — E559 Vitamin D deficiency, unspecified: Secondary | ICD-10-CM

## 2020-12-22 MED ORDER — VITAMIN D (ERGOCALCIFEROL) 1.25 MG (50000 UNIT) PO CAPS: 50000.0000 [IU] | ORAL_CAPSULE | ORAL | 0 refills | Status: DC

## 2020-12-28 ENCOUNTER — Encounter (INDEPENDENT_AMBULATORY_CARE_PROVIDER_SITE_OTHER): Payer: Self-pay | Admitting: Family Medicine

## 2020-12-28 ENCOUNTER — Telehealth: Payer: Self-pay

## 2020-12-28 DIAGNOSIS — R7303 Prediabetes: Secondary | ICD-10-CM | POA: Insufficient documentation

## 2020-12-28 NOTE — Telephone Encounter (Signed)
Patient called stating he spoke with a representative with [employer] and they recommended he get signed up for the payment assistance program. Patient states the representative sent over paperwork to be filled out to our office on 12/09/2020.  Tammy have you seen this paperwork?

## 2020-12-28 NOTE — Progress Notes (Signed)
Chief Complaint:   OBESITY Jared [name] is here to discuss his progress with his obesity treatment plan along with follow-up of his obesity related diagnoses. Mcclellan is on the Category 2 Plan and states he is following his eating plan approximately 50% of the time. Froilan states he is doing cardio and full body workout for 120 minutes 3 times per week.  Today's visit was #: 4 Starting weight: 209 lbs Starting date: 10/28/2020 Today's weight: 205 lbs Today's date: 12/22/2020 Total lbs lost to date: 4 lbs Total lbs lost since last in-office visit: +1  Interim History: Arslan was off plan for a few days due to family visiting.  He is now back on plan.  He notes he is eating less sweets.  He is consistent with exercise.  He is going to the beach on Thursday for 3-4 days.  Subjective:   1. Vitamin D deficiency Vitamin D is low at 29.3. He is on rx vit D.  2. Pre-diabetes Last A1c 6.3.  He is not on metformin.  Lab Results  Component Value Date   HGBA1C 6.3 (H) 10/28/2020   Lab Results  Component Value Date   INSULIN 33.6 (H) 10/28/2020   Assessment/Plan:   1. Vitamin D deficiency Will refill vitamin D 50,000 IU weekly, as per below.  - Refill Vitamin D, Ergocalciferol, (DRISDOL) 1.25 MG (50000 UNIT) CAPS capsule; Take 1 capsule (50,000 Units total) by mouth every 7 (seven) days.  Dispense: 12 capsule; Refill: 0  2. Pre-diabetes Continue meal plan.  3. Class 1 obesity with serious comorbidity and body mass index (BMI) of 33.0 to 33.9 in adult, unspecified obesity type  Chae is currently in the action stage of change. As such, his goal is to continue with weight loss efforts. He has agreed to the Category 2 Plan.   Exercise goals: As is.  Behavioral modification strategies: decreasing simple carbohydrates.  Abdul has agreed to follow-up with our clinic in 3 weeks.   Objective:   Blood pressure 125/80, pulse 76, temperature 97.7 F (36.5 C), height 5\' 6"  (1.676 m), weight 205  lb (93 kg), SpO2 96 %. Body mass index is 33.09 kg/m.  General: Cooperative, alert, well developed, in no acute distress. HEENT: Conjunctivae and lids unremarkable. Cardiovascular: Regular rhythm.  Lungs: Normal work of breathing. Neurologic: No focal deficits.   Lab Results  Component Value Date   CREATININE 1.44 (H) 10/28/2020   BUN 18 10/28/2020   NA 141 10/28/2020   K 4.7 10/28/2020   CL 104 10/28/2020   CO2 21 10/28/2020   Lab Results  Component Value Date   ALT 34 10/28/2020   AST 25 10/28/2020   ALKPHOS 81 10/28/2020   BILITOT 0.3 10/28/2020   Lab Results  Component Value Date   HGBA1C 6.3 (H) 10/28/2020   HGBA1C (H) 03/17/2010    5.9 (NOTE)                                                                       According to the ADA Clinical Practice Recommendations for 2011, when HbA1c is used as a screening test:   >=6.5%   Diagnostic of Diabetes Mellitus           (if  abnormal result  is confirmed)  5.7-6.4%   Increased risk of developing Diabetes Mellitus  References:Diagnosis and Classification of Diabetes Mellitus,Diabetes XIPJ,8250,53(ZJQBH 1):S62-S69 and Standards of Medical Care in         Diabetes - 2011,Diabetes ALPF,7902,40  (Suppl 1):S11-S61.   HGBA1C  04/08/2008    6.0 (NOTE)   The ADA recommends the following therapeutic goal for glycemic   control related to Hgb A1C measurement:   Goal of Therapy:   < 7.0% Hgb A1C   Reference: American Diabetes Association: Clinical Practice   Recommendations 2008, Diabetes Care,  2008, 31:(Suppl 1).   Lab Results  Component Value Date   INSULIN 33.6 (H) 10/28/2020   Lab Results  Component Value Date   TSH 2.910 10/28/2020   Lab Results  Component Value Date   CHOL 208 (H) 10/28/2020   HDL 38 (L) 10/28/2020   LDLCALC 138 (H) 10/28/2020   TRIG 179 (H) 10/28/2020   CHOLHDL 5.5 (H) 10/16/2018   Lab Results  Component Value Date   WBC 8.1 10/28/2020   HGB 14.6 10/28/2020   HCT 45.1 10/28/2020   MCV 84  10/28/2020   PLT 298 10/28/2020   Lab Results  Component Value Date   FERRITIN 334 11/20/2019   Obesity Behavioral Intervention:   Approximately 15 minutes were spent on the discussion below.  ASK: We discussed the diagnosis of obesity with Shanon Brow today and Shanon Brow agreed to give [place] permission to discuss obesity behavioral modification therapy today.  ASSESS: Brandley has the diagnosis of obesity and his BMI today is 33.2. Mykeal is in the action stage of change.   ADVISE: Angello was educated on the multiple health risks of obesity as well as the benefit of weight loss to improve his health. He was advised of the need for long term treatment and the importance of lifestyle modifications to improve his current health and to decrease his risk of future health problems.  AGREE: Multiple dietary modification options and treatment options were discussed and Mason agreed to follow the recommendations documented in the above note.  ARRANGE: Jhair was educated on the importance of frequent visits to treat obesity as outlined per CMS and USPSTF guidelines and agreed to schedule his next follow up appointment today.  Attestation Statements:   Reviewed by clinician on day of visit: allergies, medications, problem list, medical history, surgical history, family history, social history, and previous encounter notes.  I, Water quality scientist, CMA, am acting as Location manager for [employer], [place].  I have reviewed the above documentation for accuracy and completeness, and I agree with the above. -  Georgianne Fick, FNP

## 2020-12-29 ENCOUNTER — Encounter: Payer: Self-pay | Admitting: Podiatry

## 2020-12-29 ENCOUNTER — Other Ambulatory Visit: Payer: Self-pay

## 2020-12-29 ENCOUNTER — Ambulatory Visit: Payer: Medicare Other | Admitting: Podiatry

## 2020-12-29 DIAGNOSIS — M79675 Pain in left toe(s): Secondary | ICD-10-CM

## 2020-12-29 DIAGNOSIS — B351 Tinea unguium: Secondary | ICD-10-CM

## 2020-12-29 DIAGNOSIS — N179 Acute kidney failure, unspecified: Secondary | ICD-10-CM

## 2020-12-29 DIAGNOSIS — M79674 Pain in right toe(s): Secondary | ICD-10-CM | POA: Diagnosis not present

## 2020-12-29 NOTE — Telephone Encounter (Signed)
Tried to reach patient but voicemail not set up and unable to leave message. I have not received paperwork from patient assistance but have same and need him to sign consent form I can mail or he can come by office to sign

## 2020-12-29 NOTE — Progress Notes (Signed)
This patient returns to the office for evaluation and treatment of long thick painful nails .  This patient is unable to trim his own nails since the patient cannot reach the feet.  Patient says the nails are painful walking and wearing his shoes.  He returns for preventive foot care services.  General Appearance  Alert, conversant and in no acute stress.  Vascular  Dorsalis pedis and posterior tibial  pulses are palpable  bilaterally.  Capillary return is within normal limits  bilaterally. Temperature is within normal limits  bilaterally.  Neurologic  Senn-Weinstein monofilament wire test within normal limits  bilaterally. Muscle power within normal limits bilaterally.  Nails Thick disfigured discolored nails with subungual debris  from hallux to fifth toes bilaterally. No evidence of bacterial infection or drainage bilaterally.  Orthopedic  No limitations of motion  feet .  No crepitus or effusions noted.  No bony pathology or digital deformities noted.  Skin  normotropic skin with no porokeratosis noted bilaterally.  No signs of infections or ulcers noted.     Onychomycosis  Pain in toes right foot  Pain in toes left foot  Debridement  of nails  1-5  B/L with a nail nipper.  Nails were then filed using a dremel tool with no incidents.    RTC 3 months    Nolin Grell DPM  

## 2020-12-30 ENCOUNTER — Ambulatory Visit: Payer: Self-pay

## 2020-12-31 ENCOUNTER — Other Ambulatory Visit: Payer: Self-pay

## 2020-12-31 ENCOUNTER — Ambulatory Visit (INDEPENDENT_AMBULATORY_CARE_PROVIDER_SITE_OTHER): Payer: Medicare Other | Admitting: *Deleted

## 2020-12-31 DIAGNOSIS — J4540 Moderate persistent asthma, uncomplicated: Secondary | ICD-10-CM

## 2020-12-31 NOTE — Telephone Encounter (Signed)
Faxed consent form to [place] patient in office to have him fill same out

## 2021-01-06 NOTE — Telephone Encounter (Signed)
Got consent and will fax same

## 2021-01-12 ENCOUNTER — Ambulatory Visit (INDEPENDENT_AMBULATORY_CARE_PROVIDER_SITE_OTHER): Payer: Medicare Other | Admitting: Family Medicine

## 2021-01-18 ENCOUNTER — Encounter (INDEPENDENT_AMBULATORY_CARE_PROVIDER_SITE_OTHER): Payer: Self-pay | Admitting: Physician Assistant

## 2021-01-18 ENCOUNTER — Ambulatory Visit (INDEPENDENT_AMBULATORY_CARE_PROVIDER_SITE_OTHER): Payer: Medicare Other | Admitting: Physician Assistant

## 2021-01-18 ENCOUNTER — Other Ambulatory Visit: Payer: Self-pay

## 2021-01-18 VITALS — BP 143/84 | HR 88 | Temp 97.7°F | Ht 66.0 in | Wt 208.0 lb

## 2021-01-18 DIAGNOSIS — E559 Vitamin D deficiency, unspecified: Secondary | ICD-10-CM | POA: Diagnosis not present

## 2021-01-18 DIAGNOSIS — R7303 Prediabetes: Secondary | ICD-10-CM

## 2021-01-18 DIAGNOSIS — E669 Obesity, unspecified: Secondary | ICD-10-CM

## 2021-01-18 DIAGNOSIS — Z6833 Body mass index (BMI) 33.0-33.9, adult: Secondary | ICD-10-CM

## 2021-01-18 LAB — LIPID PANEL
Chol/HDL Ratio: 4.6 ratio (ref 0.0–5.0)
Cholesterol, Total: 237 mg/dL — ABNORMAL HIGH (ref 100–199)
HDL: 51 mg/dL (ref >39)
LDL Chol Calc (NIH): 150 mg/dL — ABNORMAL HIGH (ref 0–99)
Triglycerides: 201 mg/dL — ABNORMAL HIGH (ref 0–149)
VLDL Cholesterol Cal: 36 mg/dL (ref 5–40)

## 2021-01-18 LAB — HEPATIC FUNCTION PANEL
ALT: 24 IU/L (ref 0–44)
AST: 17 IU/L (ref 0–40)
Albumin: 4.4 g/dL (ref 3.7–4.7)
Alkaline Phosphatase: 80 IU/L (ref 44–121)
Bilirubin Total: 0.3 mg/dL (ref 0.0–1.2)
Bilirubin, Direct: 0.11 mg/dL (ref 0.00–0.40)
Total Protein: 7.4 g/dL (ref 6.0–8.5)

## 2021-01-19 NOTE — Progress Notes (Signed)
Chief Complaint:   OBESITY Jared [name] is here to discuss his progress with his obesity treatment plan along with follow-up of his obesity related diagnoses. Jared [name] is on the Category 2 Plan and states he is following his eating plan approximately 50% of the time. Jared [name] states he is at the gym for 30 minutes 3-4 times per week.  Today's visit was #: 5 Starting weight: 209 lbs Starting date: 10/28/2020 Today's weight: 208 lbs Today's date: 01/18/2021 Total lbs lost to date: 1 Total lbs lost since last in-office visit: 0  Interim History: Jared [name] reports that he has had multiple social events recently and he has not been following the plan closely. He has been indulging in cookies, cakes, pizza, and hot dogs. He is not weighing his protein after cooking.  Subjective:   1. Pre-diabetes Jared [name]'s last A1c was 6.3, and he is not on medications.  2. Vitamin D deficiency Jared [name] is on Vit D weekly, and he is tolerating it well.  Assessment/Plan:   1. Pre-diabetes Jared [name] will continue to work on weight loss, exercise, and decreasing simple carbohydrates to help decrease the risk of diabetes. We will recheck labs at his next visit.  2. Vitamin D deficiency Low Vitamin D level contributes to fatigue and are associated with obesity, breast, and colon cancer. Jared [name] agreed to continue taking prescription Vitamin D 50,000 IU every week and will follow-up for routine testing of Vitamin D, at least 2-3 times per year to avoid over-replacement.  3. Class 1 obesity with serious comorbidity and body mass index (BMI) of 33.0 to 33.9 in adult, unspecified obesity type Jared [name] is currently in the action stage of change. As such, his goal is to continue with weight loss efforts. He has agreed to the Category 2 Plan.   We will recheck fasting labs at his next visit.  Exercise goals: As is.  Behavioral modification strategies: increasing lean protein intake, decreasing simple carbohydrates, meal planning and  cooking strategies and keeping healthy foods in the home.  Jared [name] has agreed to follow-up with our clinic in 2 weeks. He was informed of the importance of frequent follow-up visits to maximize his success with intensive lifestyle modifications for his multiple health conditions.   Objective:   Blood pressure (!) 143/84, pulse 88, temperature 97.7 F (36.5 C), height 5\' 6"  (1.676 m), weight 208 lb (94.3 kg), SpO2 96 %. Body mass index is 33.57 kg/m.  General: Cooperative, alert, well developed, in no acute distress. HEENT: Conjunctivae and lids unremarkable. Cardiovascular: Regular rhythm.  Lungs: Normal work of breathing. Neurologic: No focal deficits.   Lab Results  Component Value Date   CREATININE 1.44 (H) 10/28/2020   BUN 18 10/28/2020   NA 141 10/28/2020   K 4.7 10/28/2020   CL 104 10/28/2020   CO2 21 10/28/2020   Lab Results  Component Value Date   ALT 24 01/18/2021   AST 17 01/18/2021   ALKPHOS 80 01/18/2021   BILITOT 0.3 01/18/2021   Lab Results  Component Value Date   HGBA1C 6.3 (H) 10/28/2020   HGBA1C (H) 03/17/2010    5.9 (NOTE)  According to the ADA Clinical Practice Recommendations for 2011, when HbA1c is used as a screening test:   >=6.5%   Diagnostic of Diabetes Mellitus           (if abnormal result  is confirmed)  5.7-6.4%   Increased risk of developing Diabetes Mellitus  References:Diagnosis and Classification of Diabetes Mellitus,Diabetes HBZJ,6967,89(FYBOF 1):S62-S69 and Standards of Medical Care in         Diabetes - 2011,Diabetes BPZW,2585,27  (Suppl 1):S11-S61.   HGBA1C  04/08/2008    6.0 (NOTE)   The ADA recommends the following therapeutic goal for glycemic   control related to Hgb A1C measurement:   Goal of Therapy:   < 7.0% Hgb A1C   Reference: American Diabetes Association: Clinical Practice   Recommendations 2008, Diabetes Care,  2008, 31:(Suppl 1).   Lab Results  Component  Value Date   INSULIN 33.6 (H) 10/28/2020   Lab Results  Component Value Date   TSH 2.910 10/28/2020   Lab Results  Component Value Date   CHOL 237 (H) 01/18/2021   HDL 51 01/18/2021   LDLCALC 150 (H) 01/18/2021   TRIG 201 (H) 01/18/2021   CHOLHDL 4.6 01/18/2021   Lab Results  Component Value Date   WBC 8.1 10/28/2020   HGB 14.6 10/28/2020   HCT 45.1 10/28/2020   MCV 84 10/28/2020   PLT 298 10/28/2020   Lab Results  Component Value Date   FERRITIN 334 11/20/2019    Obesity Behavioral Intervention:   Approximately 15 minutes were spent on the discussion below.  ASK: We discussed the diagnosis of obesity with Jared [name] today and Jared [name] agreed to give [place] permission to discuss obesity behavioral modification therapy today.  ASSESS: Jared [name] has the diagnosis of obesity and his BMI today is 33.59. Jared [name] is in the action stage of change.   ADVISE: Jared [name] was educated on the multiple health risks of obesity as well as the benefit of weight loss to improve his health. He was advised of the need for long term treatment and the importance of lifestyle modifications to improve his current health and to decrease his risk of future health problems.  AGREE: Multiple dietary modification options and treatment options were discussed and Jared [name] agreed to follow the recommendations documented in the above note.  ARRANGE: Jared [name] was educated on the importance of frequent visits to treat obesity as outlined per CMS and USPSTF guidelines and agreed to schedule his next follow up appointment today.  Attestation Statements:   Reviewed by clinician on day of visit: allergies, medications, problem list, medical history, surgical history, family history, social history, and previous encounter notes.   Wilhemena Durie, am acting as transcriptionist for [employer], PA-C.  I have reviewed the above documentation for accuracy and completeness, and I agree with the above. Abby Potash,  PA-C

## 2021-01-22 ENCOUNTER — Other Ambulatory Visit: Payer: Self-pay | Admitting: Urology

## 2021-01-28 ENCOUNTER — Ambulatory Visit: Payer: Self-pay

## 2021-02-01 ENCOUNTER — Ambulatory Visit: Payer: Self-pay

## 2021-02-02 ENCOUNTER — Encounter (INDEPENDENT_AMBULATORY_CARE_PROVIDER_SITE_OTHER): Payer: Self-pay | Admitting: Physician Assistant

## 2021-02-02 ENCOUNTER — Other Ambulatory Visit: Payer: Self-pay

## 2021-02-02 ENCOUNTER — Ambulatory Visit (INDEPENDENT_AMBULATORY_CARE_PROVIDER_SITE_OTHER): Payer: Medicare Other | Admitting: Physician Assistant

## 2021-02-02 VITALS — BP 118/75 | HR 89 | Temp 98.0°F | Ht 66.0 in | Wt 208.0 lb

## 2021-02-02 DIAGNOSIS — E669 Obesity, unspecified: Secondary | ICD-10-CM

## 2021-02-02 DIAGNOSIS — E559 Vitamin D deficiency, unspecified: Secondary | ICD-10-CM | POA: Diagnosis not present

## 2021-02-02 DIAGNOSIS — R7303 Prediabetes: Secondary | ICD-10-CM | POA: Diagnosis not present

## 2021-02-02 DIAGNOSIS — Z6834 Body mass index (BMI) 34.0-34.9, adult: Secondary | ICD-10-CM | POA: Diagnosis not present

## 2021-02-02 MED ORDER — VITAMIN D (ERGOCALCIFEROL) 1.25 MG (50000 UNIT) PO CAPS: 50000.0000 [IU] | ORAL_CAPSULE | ORAL | 0 refills | Status: DC

## 2021-02-02 NOTE — Progress Notes (Signed)
Chief Complaint:   OBESITY Ihor is here to discuss his progress with his obesity treatment plan along with follow-up of his obesity related diagnoses. Noel is on the Category 2 Plan and states he is following his eating plan approximately 60% of the time. Kekoa states he is at the gym for 60-90 minutes 2-3 times per week.  Today's visit was #: 6 Starting weight: 209 lbs Starting date: 10/28/2020 Today's weight: 208 lbs Today's date: 02/02/2021 Total lbs lost to date: 1 Total lbs lost since last in-office visit: 0  Interim History: Heber reports that he is trying to make good choices. He is eating grilled chicken and salad when he goes out to eat. He denies excessive hunger.  Subjective:   1. Pre-diabetes Jheremy is not on medications, and he denies polyphagia.  2. Vitamin D deficiency Bladen is on Vit D, and he is tolerating it well.  Assessment/Plan:   1. Pre-diabetes Donivan will continue to work on weight loss, exercise, and decreasing simple carbohydrates to help decrease the risk of diabetes. We will check labs today.  - Comprehensive metabolic panel - Hemoglobin A1c - Insulin, random  2. Vitamin D deficiency Low Vitamin D level contributes to fatigue and are associated with obesity, breast, and colon cancer. We will check labs today, and we will refill prescription Vitamin D for 1 month. Richey will follow-up for routine testing of Vitamin D, at least 2-3 times per year to avoid over-replacement.  - VITAMIN D 25 Hydroxy (Vit-D Deficiency, Fractures) - Vitamin D, Ergocalciferol, (DRISDOL) 1.25 MG (50000 UNIT) CAPS capsule; Take 1 capsule (50,000 Units total) by mouth every 7 (seven) days.  Dispense: 4 capsule; Refill: 0  3. Class 1 obesity with serious comorbidity and body mass index (BMI) of 34.0 to 34.9 in adult, unspecified obesity type Myles is currently in the action stage of change. As such, his goal is to continue with weight loss efforts. He has agreed to the  Category 2 Plan.   We will recheck his metabolism at his next visit.  Exercise goals: As is.  Behavioral modification strategies: decreasing eating out and meal planning and cooking strategies.  Doyce has agreed to follow-up with our clinic in 2 weeks. He was informed of the importance of frequent follow-up visits to maximize his success with intensive lifestyle modifications for his multiple health conditions.   Adden was informed we would discuss his lab results at his next visit unless there is a critical issue that needs to be addressed sooner. Lynkin agreed to keep his next visit at the agreed upon time to discuss these results.  Objective:   Blood pressure 118/75, pulse 89, temperature 98 F (36.7 C), height 5\' 6"  (1.676 m), weight 208 lb (94.3 kg), SpO2 98 %. Body mass index is 33.57 kg/m.  General: Cooperative, alert, well developed, in no acute distress. HEENT: Conjunctivae and lids unremarkable. Cardiovascular: Regular rhythm.  Lungs: Normal work of breathing. Neurologic: No focal deficits.   Lab Results  Component Value Date   CREATININE 1.44 (H) 10/28/2020   BUN 18 10/28/2020   NA 141 10/28/2020   K 4.7 10/28/2020   CL 104 10/28/2020   CO2 21 10/28/2020   Lab Results  Component Value Date   ALT 24 01/18/2021   AST 17 01/18/2021   ALKPHOS 80 01/18/2021   BILITOT 0.3 01/18/2021   Lab Results  Component Value Date   HGBA1C 6.3 (H) 10/28/2020   HGBA1C (H) 03/17/2010    5.9 (  NOTE)                                                                       According to the ADA Clinical Practice Recommendations for 2011, when HbA1c is used as a screening test:   >=6.5%   Diagnostic of Diabetes Mellitus           (if abnormal result  is confirmed)  5.7-6.4%   Increased risk of developing Diabetes Mellitus  References:Diagnosis and Classification of Diabetes Mellitus,Diabetes PPIR,5188,41(YSAYT 1):S62-S69 and Standards of Medical Care in         Diabetes - 2011,Diabetes  KZSW,1093,23  (Suppl 1):S11-S61.   HGBA1C  04/08/2008    6.0 (NOTE)   The ADA recommends the following therapeutic goal for glycemic   control related to Hgb A1C measurement:   Goal of Therapy:   < 7.0% Hgb A1C   Reference: American Diabetes Association: Clinical Practice   Recommendations 2008, Diabetes Care,  2008, 31:(Suppl 1).   Lab Results  Component Value Date   INSULIN 33.6 (H) 10/28/2020   Lab Results  Component Value Date   TSH 2.910 10/28/2020   Lab Results  Component Value Date   CHOL 237 (H) 01/18/2021   HDL 51 01/18/2021   LDLCALC 150 (H) 01/18/2021   TRIG 201 (H) 01/18/2021   CHOLHDL 4.6 01/18/2021   Lab Results  Component Value Date   WBC 8.1 10/28/2020   HGB 14.6 10/28/2020   HCT 45.1 10/28/2020   MCV 84 10/28/2020   PLT 298 10/28/2020   Lab Results  Component Value Date   FERRITIN 334 11/20/2019    Obesity Behavioral Intervention:   Approximately 15 minutes were spent on the discussion below.  ASK: We discussed the diagnosis of obesity with Shanon Brow today and Shanon Brow agreed to give [place] permission to discuss obesity behavioral modification therapy today.  ASSESS: Boniface has the diagnosis of obesity and his BMI today is 33.59. Ransome is in the action stage of change.   ADVISE: Clinton was educated on the multiple health risks of obesity as well as the benefit of weight loss to improve his health. He was advised of the need for long term treatment and the importance of lifestyle modifications to improve his current health and to decrease his risk of future health problems.  AGREE: Multiple dietary modification options and treatment options were discussed and Skeeter agreed to follow the recommendations documented in the above note.  ARRANGE: Teancum was educated on the importance of frequent visits to treat obesity as outlined per CMS and USPSTF guidelines and agreed to schedule his next follow up appointment today.  Attestation Statements:   Reviewed by  clinician on day of visit: allergies, medications, problem list, medical history, surgical history, family history, social history, and previous encounter notes.   Wilhemena Durie, am acting as transcriptionist for [employer], PA-C.  I have reviewed the above documentation for accuracy and completeness, and I agree with the above. Abby Potash, PA-C

## 2021-02-03 LAB — COMPREHENSIVE METABOLIC PANEL
ALT: 30 IU/L (ref 0–44)
AST: 15 IU/L (ref 0–40)
Albumin/Globulin Ratio: 1.4 (ref 1.2–2.2)
Albumin: 4.4 g/dL (ref 3.7–4.7)
Alkaline Phosphatase: 82 IU/L (ref 44–121)
BUN/Creatinine Ratio: 20 (ref 10–24)
BUN: 27 mg/dL (ref 8–27)
Bilirubin Total: 0.3 mg/dL (ref 0.0–1.2)
CO2: 21 mmol/L (ref 20–29)
Calcium: 9.4 mg/dL (ref 8.6–10.2)
Chloride: 102 mmol/L (ref 96–106)
Creatinine, Ser: 1.36 mg/dL — ABNORMAL HIGH (ref 0.76–1.27)
Globulin, Total: 3.1 g/dL (ref 1.5–4.5)
Glucose: 102 mg/dL — ABNORMAL HIGH (ref 65–99)
Potassium: 4.7 mmol/L (ref 3.5–5.2)
Sodium: 139 mmol/L (ref 134–144)
Total Protein: 7.5 g/dL (ref 6.0–8.5)
eGFR: 55 mL/min/{1.73_m2} — ABNORMAL LOW (ref >59)

## 2021-02-03 LAB — VITAMIN D 25 HYDROXY (VIT D DEFICIENCY, FRACTURES): Vit D, 25-Hydroxy: 50.2 ng/mL (ref 30.0–100.0)

## 2021-02-03 LAB — HEMOGLOBIN A1C
Est. average glucose Bld gHb Est-mCnc: 128 mg/dL
Hgb A1c MFr Bld: 6.1 % — ABNORMAL HIGH (ref 4.8–5.6)

## 2021-02-03 LAB — INSULIN, RANDOM: INSULIN: 68.7 u[IU]/mL — ABNORMAL HIGH (ref 2.6–24.9)

## 2021-02-04 ENCOUNTER — Other Ambulatory Visit (INDEPENDENT_AMBULATORY_CARE_PROVIDER_SITE_OTHER): Payer: Self-pay | Admitting: Physician Assistant

## 2021-02-04 DIAGNOSIS — E559 Vitamin D deficiency, unspecified: Secondary | ICD-10-CM

## 2021-02-04 NOTE — Telephone Encounter (Signed)
Jared [name] 

## 2021-02-10 ENCOUNTER — Ambulatory Visit: Payer: Self-pay

## 2021-02-11 ENCOUNTER — Telehealth: Payer: Self-pay | Admitting: Cardiovascular Disease

## 2021-02-11 ENCOUNTER — Other Ambulatory Visit: Payer: Self-pay

## 2021-02-11 DIAGNOSIS — E7800 Pure hypercholesterolemia, unspecified: Secondary | ICD-10-CM

## 2021-02-11 DIAGNOSIS — N179 Acute kidney failure, unspecified: Secondary | ICD-10-CM

## 2021-02-11 DIAGNOSIS — I2510 Atherosclerotic heart disease of native coronary artery without angina pectoris: Secondary | ICD-10-CM

## 2021-02-11 NOTE — Telephone Encounter (Signed)
New message:     Patient calling to his results.

## 2021-02-11 NOTE — Telephone Encounter (Signed)
Spoke to patient lab results given.Stated he stopped taking Repatha.He will restart and repeat fasting lipid panel in 3 months.Orders placed.

## 2021-02-11 NOTE — Progress Notes (Signed)
pis

## 2021-02-12 ENCOUNTER — Ambulatory Visit: Payer: Self-pay

## 2021-03-02 ENCOUNTER — Other Ambulatory Visit: Payer: Self-pay | Admitting: Allergy

## 2021-03-03 ENCOUNTER — Encounter: Payer: Self-pay | Admitting: Gastroenterology

## 2021-03-04 ENCOUNTER — Ambulatory Visit: Payer: Medicare Other | Admitting: Family Medicine

## 2021-03-04 ENCOUNTER — Ambulatory Visit: Payer: Medicare Other | Admitting: Neurology

## 2021-03-08 ENCOUNTER — Telehealth: Payer: Self-pay | Admitting: Family Medicine

## 2021-03-08 ENCOUNTER — Encounter: Payer: Medicare Other | Admitting: Gastroenterology

## 2021-03-08 ENCOUNTER — Ambulatory Visit (INDEPENDENT_AMBULATORY_CARE_PROVIDER_SITE_OTHER): Payer: Medicare Other | Admitting: Physician Assistant

## 2021-03-08 ENCOUNTER — Ambulatory Visit: Payer: Medicare Other | Admitting: Family Medicine

## 2021-03-08 NOTE — Telephone Encounter (Signed)
Pt cancelled appt will out of town

## 2021-03-11 ENCOUNTER — Other Ambulatory Visit: Payer: Self-pay | Admitting: Allergy and Immunology

## 2021-03-11 ENCOUNTER — Other Ambulatory Visit: Payer: Self-pay | Admitting: Allergy

## 2021-03-16 LAB — LIPID PANEL
Chol/HDL Ratio: 3.8 ratio (ref 0.0–5.0)
Cholesterol, Total: 150 mg/dL (ref 100–199)
HDL: 39 mg/dL — ABNORMAL LOW (ref >39)
LDL Chol Calc (NIH): 81 mg/dL (ref 0–99)
Triglycerides: 176 mg/dL — ABNORMAL HIGH (ref 0–149)
VLDL Cholesterol Cal: 30 mg/dL (ref 5–40)

## 2021-03-17 ENCOUNTER — Ambulatory Visit: Payer: Medicare Other | Admitting: Podiatry

## 2021-03-19 ENCOUNTER — Encounter (HOSPITAL_BASED_OUTPATIENT_CLINIC_OR_DEPARTMENT_OTHER): Payer: Self-pay | Admitting: Urology

## 2021-03-19 ENCOUNTER — Other Ambulatory Visit: Payer: Self-pay

## 2021-03-19 ENCOUNTER — Encounter: Payer: Self-pay | Admitting: *Deleted

## 2021-03-19 DIAGNOSIS — Z8659 Personal history of other mental and behavioral disorders: Secondary | ICD-10-CM

## 2021-03-19 HISTORY — DX: Personal history of other mental and behavioral disorders: Z86.59

## 2021-03-19 NOTE — Progress Notes (Addendum)
Spoke w/ via phone for pre-op interview---pt Lab needs dos----    I stat             Lab results------see below COVID test -----03-23-2021 1115 (overnight stay) NPO after MN NO Solid Food.  Clear liquids from MN until---1100 am then npo Med rec completed Medications to take morning of surgery -----albuterol inhaler, pulmicort nebulizer, flovent/bring all inhalers with you day of surgery, isosorbide mononitrate, abilify, amlodipine, nexium, cymbalta, levothyroxine Diabetic medication ----- Patient instructed no nail polish to be worn day of surgery Patient instructed to bring photo id and insurance card day of surgery Patient aware to have Driver (ride ) / caregiver    for 24 hours after surgery  Patient Special Instructions -----overnight stay instructions given Pre-Op special Istructions -----bring cpap mask tubing and machine day of surgery Patient verbalized understanding of instructions that were given at this phone interview. Patient denies shortness of breath, chest pain, fever, cough at this phone interview.   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, cad, htn, osa with cpap use, left kidney cancer 1997, neuroleptic induced parkinosoniasm.pt denies any cardiac s & s or sob at pre op call  PCP: dr Burnard Bunting Cardiologist : dr Otis Peak 10-16-2020 epic Chest x-ray :04-02-2020 epic EKG : 10-16-2020 epic Echo :02-19-2020 epic Stress test:none Cardiac Cath : multiple caths, last done 2013 Activity level: does oen house and yard work without problems, can climb flight of stairs without problems and can lie flat Sleep Study/ CPAP : uses cpap set on 6 to 16, sleep study home test 09-02-2019 epic Blood Thinner/ Instructions /Last Dose:pt told per dr Diona Fanti to stop 325 aspirin 5 days before surgery, last dose will be 03-19-2021 (note from dr dahlstedt on chart to stop aspirin 5 days before surgery) ASA / Instructions/ Last Dose :

## 2021-03-23 ENCOUNTER — Other Ambulatory Visit (HOSPITAL_COMMUNITY)
Admission: RE | Admit: 2021-03-23 | Discharge: 2021-03-23 | Disposition: A | Discharge status: Home / Self Care | Payer: Medicare Other | Source: Ambulatory Visit | Attending: Urology | Admitting: Urology

## 2021-03-23 DIAGNOSIS — Z01812 Encounter for preprocedural laboratory examination: Secondary | ICD-10-CM | POA: Insufficient documentation

## 2021-03-23 DIAGNOSIS — Z20822 Contact with and (suspected) exposure to covid-19: Secondary | ICD-10-CM | POA: Insufficient documentation

## 2021-03-24 LAB — SARS CORONAVIRUS 2 (TAT 6-24 HRS): SARS Coronavirus 2: NEGATIVE

## 2021-03-24 NOTE — H&P (Signed)
H&P  Chief Complaint: Large prostate  History of Present Illness: 70 yo male presents for TURP for mgmt of BPH w/ significant LUTS  Past Medical History:  Diagnosis Date   Anxiety    Asthma    Cervical vertebral fusion 05/02/2014   Now presenting with C5-6 radiculopathy   Coronary artery disease    COVID 01/2020   pain all over monoclonal antibodies given all symptoms resolved   GERD (gastroesophageal reflux disease)    H/O heart artery stent 2000   to right rca   Heart attack ([place]) 2009   History of depression 03/19/2021   History of esophageal stricture    History of spinal fracture 2015   Hyperlipidemia    Hypertension    Hypothyroidism    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   Osteoarthritis    lower back   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   Renal cell carcinoma 1997   Left kidney   Sleep apnea    wears CPAP   Sleep talking    Snoring 12/25/2013   Wears glasses     Past Surgical History:  Procedure Laterality Date   ANTERIOR CERVICAL DECOMP/DISCECTOMY FUSION N/A 08/27/2014   Procedure: Cervical six-seven anterior cervical decompression fusion with removal of hardware at Cervical five-six;  Surgeon: Erline Levine, MD;  Location: [facility];  Service: Neurosurgery;  Laterality: N/A;  Cervical six-seven anterior cervical decompression fusion with removal of hardware at Cervical five-six   BACK SURGERY     1992, 2013 lower back   CARDIAC CATHETERIZATION  05/10/2007   Minimal CAD, normal LV systolic function, medical management   CARDIAC CATHETERIZATION  04/08/2008   RCA ulcerated 70-80% stenosis, stented with a 3x75mm Endeavor stent at 13atm for 50sec, reduced from 80% ulcerated stenosis to 0%.   CARDIAC CATHETERIZATION  05/07/2009   50% distal left main disease-IVUS or flow wire too dangerous in particular setting to perform intervention.   CARDIAC CATHETERIZATION  03/18/2010   Medical management   CARDIOVASCULAR STRESS TEST   02/09/2012   Normal, no significant wall abnormalities noted   CARPAL TUNNEL RELEASE Bilateral 2000   COLONOSCOPY W/ BIOPSIES AND POLYPECTOMY     CORONARY STENT PLACEMENT     FEMUR FRACTURE SURGERY Left 1995   HERNIA REPAIR     as infant groin   NECK SURGERY  2005   NEPHRECTOMY Left 1997   secondary to cancer   TOTAL KNEE ARTHROPLASTY Left 2005   TOTAL KNEE ARTHROPLASTY Right 04/13/2018   Procedure: RIGHT TOTAL KNEE ARTHROPLASTY;  Surgeon: Netta Cedars, MD;  Location: [facility];  Service: Orthopedics;  Laterality: Right;    Home Medications:  Allergies as of 03/24/2021       Reactions   Contrast Media [iodinated Diagnostic Agents] Other (See Comments)   Was told not to take d/t pt only having 1 kidney   Nsaids Other (See Comments)   Was told not to take d/t pt only having 1 kidney   Penicillins Hives, Itching, Other (See Comments)   Has patient had a PCN reaction causing immediate rash, facial/tongue/throat swelling, SOB or lightheadedness with hypotension: Unknown Has patient had a PCN reaction causing severe rash involving mucus membranes or skin necrosis: Unknown Has patient had a PCN reaction that required hospitalization: Unknown Has patient had a PCN reaction occurring within the last 10 years: No If all of the above answers are "NO", then may proceed with Cephalosporin use.   Sulfonamide Derivatives Hives, Itching  Codeine Nausea Only   Statins Other (See Comments)   Myalgias        Medication List      Notice   Cannot display discharge medications because the patient has not yet been admitted.     Allergies:  Allergies  Allergen Reactions   Contrast Media [Iodinated Diagnostic Agents] Other (See Comments)    Was told not to take d/t pt only having 1 kidney   Nsaids Other (See Comments)    Was told not to take d/t pt only having 1 kidney   Penicillins Hives, Itching and Other (See Comments)    Has patient had a PCN reaction causing immediate rash,  facial/tongue/throat swelling, SOB or lightheadedness with hypotension: Unknown Has patient had a PCN reaction causing severe rash involving mucus membranes or skin necrosis: Unknown Has patient had a PCN reaction that required hospitalization: Unknown Has patient had a PCN reaction occurring within the last 10 years: No If all of the above answers are "NO", then may proceed with Cephalosporin use.    Sulfonamide Derivatives Hives and Itching   Codeine Nausea Only   Statins Other (See Comments)    Myalgias    Family History  Problem Relation Age of Onset   Asthma Mother    Heart disease Mother    Hypertension Mother    Thyroid disease Mother    Esophageal cancer Father    Barrett's esophagus Father    Bone cancer Father        mets from esophagus   Heart disease Father    Cancer Father    Depression Father    Anxiety disorder Father    Alcoholism Father    Obesity Father    Heart disease Paternal Grandfather    Heart attack Paternal Grandfather 2   Colon cancer Maternal Uncle    Diabetes Sister    Cancer Sister        Cervical cancer   Stomach cancer Neg Hx     Social History:  reports that he has never smoked. He has never used smokeless tobacco. He reports previous alcohol use. He reports that he does not use drugs.  ROS: A complete review of systems was performed.  All systems are negative except for pertinent findings as noted.  Physical Exam:  Vital signs in last 24 hours: Ht 5' 7.5" (1.715 m)   Wt 96.2 kg   BMI 32.71 kg/m  Constitutional:  Alert and oriented, No acute distress Cardiovascular: Regular rate  Respiratory: Normal respiratory effort GI: Abdomen is soft, nontender, nondistended, no abdominal masses. No CVAT.  Genitourinary: Normal male phallus, testes are descended bilaterally and non-tender and without masses, scrotum is normal in appearance without lesions or masses, perineum is normal on inspection. Lymphatic: No lymphadenopathy Neurologic:  Grossly intact, no focal deficits Psychiatric: Normal mood and affect   Impression/Assessment:  BPH w/ obstruction  Plan:  TURP

## 2021-03-25 ENCOUNTER — Ambulatory Visit (HOSPITAL_BASED_OUTPATIENT_CLINIC_OR_DEPARTMENT_OTHER): Payer: Medicare Other | Admitting: Anesthesiology

## 2021-03-25 ENCOUNTER — Observation Stay (HOSPITAL_BASED_OUTPATIENT_CLINIC_OR_DEPARTMENT_OTHER)
Admission: RE | Admit: 2021-03-25 | Discharge: 2021-03-26 | Disposition: A | Discharge status: Home / Self Care | Payer: Medicare Other | Attending: Urology | Admitting: Urology

## 2021-03-25 ENCOUNTER — Encounter (HOSPITAL_BASED_OUTPATIENT_CLINIC_OR_DEPARTMENT_OTHER): Payer: Self-pay | Admitting: Urology

## 2021-03-25 ENCOUNTER — Encounter (HOSPITAL_BASED_OUTPATIENT_CLINIC_OR_DEPARTMENT_OTHER)
Admission: RE | Disposition: A | Discharge status: Home / Self Care | Payer: Self-pay | Source: Home / Self Care | Attending: Urology

## 2021-03-25 DIAGNOSIS — N401 Enlarged prostate with lower urinary tract symptoms: Secondary | ICD-10-CM | POA: Diagnosis present

## 2021-03-25 DIAGNOSIS — Z96653 Presence of artificial knee joint, bilateral: Secondary | ICD-10-CM | POA: Insufficient documentation

## 2021-03-25 DIAGNOSIS — Z85528 Personal history of other malignant neoplasm of kidney: Secondary | ICD-10-CM | POA: Insufficient documentation

## 2021-03-25 DIAGNOSIS — I10 Essential (primary) hypertension: Secondary | ICD-10-CM | POA: Diagnosis not present

## 2021-03-25 DIAGNOSIS — E039 Hypothyroidism, unspecified: Secondary | ICD-10-CM | POA: Insufficient documentation

## 2021-03-25 DIAGNOSIS — C61 Malignant neoplasm of prostate: Secondary | ICD-10-CM | POA: Diagnosis not present

## 2021-03-25 DIAGNOSIS — I2510 Atherosclerotic heart disease of native coronary artery without angina pectoris: Secondary | ICD-10-CM | POA: Insufficient documentation

## 2021-03-25 DIAGNOSIS — N138 Other obstructive and reflux uropathy: Secondary | ICD-10-CM | POA: Diagnosis present

## 2021-03-25 DIAGNOSIS — J45909 Unspecified asthma, uncomplicated: Secondary | ICD-10-CM | POA: Insufficient documentation

## 2021-03-25 DIAGNOSIS — Z8616 Personal history of COVID-19: Secondary | ICD-10-CM | POA: Diagnosis not present

## 2021-03-25 HISTORY — DX: Hypothyroidism, unspecified: E03.9

## 2021-03-25 HISTORY — DX: Adverse effect of unspecified antipsychotics and neuroleptics, initial encounter: T43.505A

## 2021-03-25 HISTORY — PX: TRANSURETHRAL RESECTION OF PROSTATE: SHX73

## 2021-03-25 HISTORY — DX: Neuroleptic induced parkinsonism: G21.11

## 2021-03-25 LAB — POCT I-STAT, CHEM 8
BUN: 29 mg/dL — ABNORMAL HIGH (ref 8–23)
Calcium, Ion: 1.3 mmol/L (ref 1.15–1.40)
Chloride: 106 mmol/L (ref 98–111)
Creatinine, Ser: 1.1 mg/dL (ref 0.61–1.24)
Glucose, Bld: 106 mg/dL — ABNORMAL HIGH (ref 70–99)
HCT: 44 % (ref 39.0–52.0)
Hemoglobin: 15 g/dL (ref 13.0–17.0)
Potassium: 4.4 mmol/L (ref 3.5–5.1)
Sodium: 140 mmol/L (ref 135–145)
TCO2: 21 mmol/L — ABNORMAL LOW (ref 22–32)

## 2021-03-25 SURGERY — TURP (TRANSURETHRAL RESECTION OF PROSTATE)
Anesthesia: General | Site: Prostate

## 2021-03-25 MED ORDER — FENTANYL CITRATE (PF) 100 MCG/2ML IJ SOLN
INTRAMUSCULAR | Status: DC | PRN
  Administered 2021-03-25 (×2): 50 ug via INTRAVENOUS
  Administered 2021-03-25 (×2): 25 ug via INTRAVENOUS
  Administered 2021-03-25: 50 ug via INTRAVENOUS

## 2021-03-25 MED ORDER — HYDROCODONE-ACETAMINOPHEN 5-325 MG PO TABS
1.0000 | ORAL_TABLET | ORAL | Status: DC | PRN
Start: 2021-03-25 — End: 2021-03-26
  Administered 2021-03-25 – 2021-03-26 (×4): 2 via ORAL

## 2021-03-25 MED ORDER — ONDANSETRON HCL 4 MG/2ML IJ SOLN
INTRAMUSCULAR | Status: DC | PRN
  Administered 2021-03-25: 4 mg via INTRAVENOUS

## 2021-03-25 MED ORDER — PROPOFOL 10 MG/ML IV BOLUS
INTRAVENOUS | Status: DC | PRN
  Administered 2021-03-25: 150 mg via INTRAVENOUS
  Administered 2021-03-25: 50 mg via INTRAVENOUS

## 2021-03-25 MED ORDER — BACITRACIN-NEOMYCIN-POLYMYXIN OINTMENT TUBE
TOPICAL_OINTMENT | CUTANEOUS | Status: AC
  Filled 2021-03-25: qty 14.17

## 2021-03-25 MED ORDER — PANTOPRAZOLE SODIUM 40 MG PO TBEC
DELAYED_RELEASE_TABLET | ORAL | Status: AC
  Filled 2021-03-25: qty 2

## 2021-03-25 MED ORDER — STERILE WATER FOR IRRIGATION IR SOLN
Status: DC | PRN
  Administered 2021-03-25: 500 mL

## 2021-03-25 MED ORDER — ARIPIPRAZOLE 5 MG PO TABS
5.0000 mg | ORAL_TABLET | Freq: Every morning | ORAL | Status: DC
  Administered 2021-03-25: 5 mg via ORAL
  Filled 2021-03-25: qty 1

## 2021-03-25 MED ORDER — LEVOTHYROXINE SODIUM 50 MCG PO TABS
50.0000 ug | ORAL_TABLET | Freq: Every morning | ORAL | Status: DC
  Administered 2021-03-26: 50 ug via ORAL
  Filled 2021-03-25: qty 1

## 2021-03-25 MED ORDER — LIDOCAINE HCL (CARDIAC) PF 100 MG/5ML IV SOSY
PREFILLED_SYRINGE | INTRAVENOUS | Status: DC | PRN
  Administered 2021-03-25: 100 mg via INTRAVENOUS

## 2021-03-25 MED ORDER — CIPROFLOXACIN IN D5W 400 MG/200ML IV SOLN
INTRAVENOUS | Status: AC
  Filled 2021-03-25: qty 200

## 2021-03-25 MED ORDER — PANTOPRAZOLE SODIUM 40 MG PO TBEC
80.0000 mg | DELAYED_RELEASE_TABLET | Freq: Every day | ORAL | Status: DC
  Administered 2021-03-25: 80 mg via ORAL

## 2021-03-25 MED ORDER — HYDROCODONE-ACETAMINOPHEN 5-325 MG PO TABS
ORAL_TABLET | ORAL | Status: AC
  Filled 2021-03-25: qty 2

## 2021-03-25 MED ORDER — PROPOFOL 10 MG/ML IV BOLUS
INTRAVENOUS | Status: AC
  Filled 2021-03-25: qty 20

## 2021-03-25 MED ORDER — MONTELUKAST SODIUM 10 MG PO TABS
5.0000 mg | ORAL_TABLET | Freq: Every day | ORAL | Status: DC
  Administered 2021-03-25: 5 mg via ORAL
  Filled 2021-03-25: qty 0.5

## 2021-03-25 MED ORDER — DEXAMETHASONE SODIUM PHOSPHATE 4 MG/ML IJ SOLN
INTRAMUSCULAR | Status: DC | PRN
  Administered 2021-03-25: 8 mg via INTRAVENOUS

## 2021-03-25 MED ORDER — LACTATED RINGERS IV SOLN: INTRAVENOUS | Status: DC

## 2021-03-25 MED ORDER — FENTANYL CITRATE (PF) 100 MCG/2ML IJ SOLN
INTRAMUSCULAR | Status: AC
  Filled 2021-03-25: qty 2

## 2021-03-25 MED ORDER — ISOSORBIDE MONONITRATE ER 30 MG PO TB24
30.0000 mg | ORAL_TABLET | Freq: Every day | ORAL | Status: DC
  Administered 2021-03-25: 30 mg via ORAL
  Filled 2021-03-25: qty 1

## 2021-03-25 MED ORDER — CIPROFLOXACIN HCL 500 MG PO TABS
ORAL_TABLET | ORAL | Status: AC
  Filled 2021-03-25: qty 1

## 2021-03-25 MED ORDER — PHENAZOPYRIDINE HCL 100 MG PO TABS
ORAL_TABLET | ORAL | Status: AC
  Filled 2021-03-25: qty 2

## 2021-03-25 MED ORDER — FENTANYL CITRATE (PF) 100 MCG/2ML IJ SOLN
25.0000 ug | INTRAMUSCULAR | Status: DC | PRN
  Administered 2021-03-25: 25 ug via INTRAVENOUS

## 2021-03-25 MED ORDER — IPRATROPIUM-ALBUTEROL 0.5-2.5 (3) MG/3ML IN SOLN: 3.0000 mL | Freq: Four times a day (QID) | RESPIRATORY_TRACT | Status: DC | PRN

## 2021-03-25 MED ORDER — ALBUTEROL SULFATE HFA 108 (90 BASE) MCG/ACT IN AERS: 2.0000 | INHALATION_SPRAY | Freq: Four times a day (QID) | RESPIRATORY_TRACT | Status: DC | PRN

## 2021-03-25 MED ORDER — MIDAZOLAM HCL 2 MG/2ML IJ SOLN
INTRAMUSCULAR | Status: AC
  Filled 2021-03-25: qty 2

## 2021-03-25 MED ORDER — SODIUM CHLORIDE 0.45 % IV SOLN: INTRAVENOUS | Status: DC

## 2021-03-25 MED ORDER — DULOXETINE HCL 60 MG PO CPEP
60.0000 mg | ORAL_CAPSULE | Freq: Every morning | ORAL | Status: DC
  Filled 2021-03-25: qty 1

## 2021-03-25 MED ORDER — AMLODIPINE BESYLATE 5 MG PO TABS
5.0000 mg | ORAL_TABLET | Freq: Every day | ORAL | Status: DC
  Filled 2021-03-25: qty 1

## 2021-03-25 MED ORDER — SENNA 8.6 MG PO TABS
1.0000 | ORAL_TABLET | Freq: Two times a day (BID) | ORAL | Status: DC
  Administered 2021-03-25: 8.6 mg via ORAL

## 2021-03-25 MED ORDER — FLUTICASONE PROPIONATE HFA 110 MCG/ACT IN AERO
2.0000 | INHALATION_SPRAY | Freq: Every day | RESPIRATORY_TRACT | Status: DC
  Filled 2021-03-25: qty 12

## 2021-03-25 MED ORDER — CIPROFLOXACIN HCL 500 MG PO TABS
250.0000 mg | ORAL_TABLET | Freq: Two times a day (BID) | ORAL | Status: DC
  Administered 2021-03-25: 250 mg via ORAL

## 2021-03-25 MED ORDER — SODIUM CHLORIDE 0.9 % IR SOLN
Status: DC | PRN
  Administered 2021-03-25 (×2): 3000 mL
  Administered 2021-03-25: 6000 mL
  Administered 2021-03-25: 3000 mL

## 2021-03-25 MED ORDER — PHENAZOPYRIDINE HCL 100 MG PO TABS
200.0000 mg | ORAL_TABLET | Freq: Three times a day (TID) | ORAL | Status: DC | PRN
  Administered 2021-03-25: 200 mg via ORAL

## 2021-03-25 MED ORDER — SENNA 8.6 MG PO TABS
ORAL_TABLET | ORAL | Status: AC
  Filled 2021-03-25: qty 1

## 2021-03-25 MED ORDER — CIPROFLOXACIN IN D5W 400 MG/200ML IV SOLN
400.0000 mg | INTRAVENOUS | Status: AC
  Administered 2021-03-25: 400 mg via INTRAVENOUS

## 2021-03-25 MED ORDER — ACETAMINOPHEN 325 MG PO TABS: 650.0000 mg | ORAL_TABLET | ORAL | Status: DC | PRN

## 2021-03-25 MED ORDER — BACITRACIN-NEOMYCIN-POLYMYXIN 400-5-5000 EX OINT
1.0000 "application " | TOPICAL_OINTMENT | Freq: Three times a day (TID) | CUTANEOUS | Status: DC | PRN
  Administered 2021-03-25: 1 via TOPICAL

## 2021-03-25 MED ORDER — SODIUM CHLORIDE 0.9 % IR SOLN
3000.0000 mL | Status: DC
  Administered 2021-03-25 (×2): 3000 mL

## 2021-03-25 MED ORDER — IRBESARTAN 75 MG PO TABS
75.0000 mg | ORAL_TABLET | Freq: Every day | ORAL | Status: DC
  Filled 2021-03-25: qty 1

## 2021-03-25 MED ORDER — ZOLPIDEM TARTRATE 5 MG PO TABS: 5.0000 mg | ORAL_TABLET | Freq: Every evening | ORAL | Status: DC | PRN

## 2021-03-25 MED ORDER — BELLADONNA ALKALOIDS-OPIUM 16.2-60 MG RE SUPP: 1.0000 | Freq: Four times a day (QID) | RECTAL | Status: DC | PRN

## 2021-03-25 SURGICAL SUPPLY — 35 items
BAG DRAIN URO-CYSTO SKYTR STRL (DRAIN) ×2 IMPLANT
BAG DRN RND TRDRP ANRFLXCHMBR (UROLOGICAL SUPPLIES) ×1
BAG DRN UROCATH (DRAIN) ×1
BAG URINE DRAIN 2000ML AR STRL (UROLOGICAL SUPPLIES) ×2 IMPLANT
BAG URINE LEG 500ML (DRAIN) IMPLANT
CATH FOLEY 2WAY SLVR  5CC 20FR (CATHETERS)
CATH FOLEY 2WAY SLVR  5CC 22FR (CATHETERS)
CATH FOLEY 2WAY SLVR 30CC 22FR (CATHETERS) IMPLANT
CATH FOLEY 2WAY SLVR 5CC 20FR (CATHETERS) IMPLANT
CATH FOLEY 2WAY SLVR 5CC 22FR (CATHETERS) IMPLANT
CATH FOLEY 3WAY 30CC 22FR (CATHETERS) IMPLANT
CATH HEMA 3WAY 30CC 24FR COUDE (CATHETERS) ×2 IMPLANT
CATH HEMA 3WAY 30CC 24FR RND (CATHETERS) IMPLANT
CLOTH BEACON ORANGE TIMEOUT ST (SAFETY) ×2 IMPLANT
ELECT REM PT RETURN 9FT ADLT (ELECTROSURGICAL) ×2
ELECTRODE REM PT RTRN 9FT ADLT (ELECTROSURGICAL) ×1 IMPLANT
EVACUATOR MICROVAS BLADDER (UROLOGICAL SUPPLIES) ×2 IMPLANT
GLOVE SURG ENC MOIS LTX SZ6.5 (GLOVE) ×4 IMPLANT
GLOVE SURG ENC MOIS LTX SZ8 (GLOVE) ×2 IMPLANT
GLOVE SURG UNDER POLY LF SZ6.5 (GLOVE) ×4 IMPLANT
GOWN STRL REUS W/TWL LRG LVL3 (GOWN DISPOSABLE) ×4 IMPLANT
GOWN STRL REUS W/TWL XL LVL3 (GOWN DISPOSABLE) ×2 IMPLANT
HOLDER FOLEY CATH W/STRAP (MISCELLANEOUS) ×2 IMPLANT
IV NS IRRIG 3000ML ARTHROMATIC (IV SOLUTION) ×10 IMPLANT
KIT TURNOVER CYSTO (KITS) ×2 IMPLANT
LOOP CUT BIPOLAR 24F LRG (ELECTROSURGICAL) ×2 IMPLANT
MANIFOLD NEPTUNE II (INSTRUMENTS) ×2 IMPLANT
PACK CYSTO (CUSTOM PROCEDURE TRAY) ×2 IMPLANT
PLUG CATH AND CAP STER (CATHETERS) IMPLANT
SYR 30ML LL (SYRINGE) IMPLANT
SYR TOOMEY IRRIG 70ML (MISCELLANEOUS)
SYRINGE TOOMEY IRRIG 70ML (MISCELLANEOUS) IMPLANT
TUBE CONNECTING 12X1/4 (SUCTIONS) ×2 IMPLANT
TUBING UROLOGY SET (TUBING) ×2 IMPLANT
WATER STERILE IRR 500ML POUR (IV SOLUTION) ×2 IMPLANT

## 2021-03-25 NOTE — Op Note (Signed)
Preoperative diagnosis: Bladder outlet obstruction secondary to BPH  Postoperative diagnosis:  Bladder outlet obstruction secondary to BPH  Procedure:  Cystoscopy Transurethral resection of the prostate  Surgeon: Lillette Boxer. Brannan Cassedy, M.D.  Anesthesia: General  Complications: None  Drain: Foley catheter  EBL: Minimal  Specimens: Prostate chips  Disposition of specimens: Pathology  Indication: CILLIAN GWINNER is a patient with bladder outlet obstruction secondary to benign prostatic hyperplasia. After reviewing the management options for treatment, he elected to proceed with the above surgical procedure(s). We have discussed the potential benefits and risks of the procedure, side effects of the proposed treatment, the likelihood of the patient achieving the goals of the procedure, and any potential problems that might occur during the procedure or recuperation. Informed consent has been obtained.  Description of procedure:  The patient was identified in the holding area. He received preoperative antibiotics. He was then taken to the operating room. General anesthetic was administered.  The patient was then placed in the dorsal lithotomy position, prepped and draped in the usual sterile fashion. Timeout was then performed.  A resectoscope sheath was placed using the visual obturator.   The bladder was then systematically examined in its entirety. There was no evidence of  tumors, stones, or other mucosal pathology.The resectoscope, loop and telescope were then placed.  The ureteral orifices were identified so as to be avoided during the procedure.  The prostate adenoma was then resected utilizing loop cautery resection with the bipolar cutting loop.  The prostate adenoma from the bladder neck back to the verumontanum was resected beginning at the six o'clock position and then extended to include the right and left lobes of the prostate and anterior prostate, respectively. Care  was taken not to resect distal to the verumontanum.  Hemostasis was then achieved with the cautery and the bladder was emptied and reinspected with no significant bleeding noted at the end of the procedure.  Resected chips were irrigated from the bladder with the evacuator and sent to pathology.  A 24 Fr 3 way catheter was then placed into the bladder and placed on continuous bladder irrigation.  The patient appeared to tolerate the procedure well and without complications. The patient was able to be awakened and transferred to the recovery unit in satisfactory condition. He tolerated the procedure well.

## 2021-03-25 NOTE — Discharge Instructions (Addendum)
Transurethral Resection of the Prostate  Care After  Refer to this sheet in the next few weeks. These discharge instructions provide you with general information on caring for yourself after you leave the hospital. Your caregiver may also give you specific instructions. Your treatment has been planned according to the most current medical practices available, but unavoidable complications sometimes occur. If you have any problems or questions after discharge, please call your caregiver.  HOME CARE INSTRUCTIONS   Medications You may receive medicine for pain management. As your level of discomfort decreases, adjustments in your pain medicines may be made.  Take all medicines as directed.  You may be given a medicine (antibiotic) to kill germs following surgery. Finish all medicines. Let your caregiver know if you have any side effects or problems from the medicine.  If you are on aspirin, it would be best not to restart the aspirin until the blood in the urine clears Continue silodosin for a week--then discontinue Hygiene You can take a shower after surgery.  You should not take a bath while you still have the urethral catheter. Activity You will be encouraged to get out of bed as much as possible and increase your activity level as tolerated.  Spend the first week in and around your home. For 3 weeks, avoid the following:  Straining.  Running.  Strenuous work.  Walks longer than a few blocks.  Riding for extended periods.  Sexual relations.  Do not lift heavy objects (more than 20 pounds) for at least 1 month. When lifting, use your arms instead of your abdominal muscles.  You will be encouraged to walk as tolerated. Do not exert yourself. Increase your activity level slowly. Remember that it is important to keep moving after an operation of any type. This cuts down on the possibility of developing blood clots.  Your caregiver will tell you when you can resume driving and light housework.  Discuss this at your first office visit after discharge. Diet No special diet is ordered after a TURP. However, if you are on a special diet for another medical problem, it should be continued.  Normal fluid intake is usually recommended.  Avoid alcohol and caffeinated drinks for 2 weeks. They irritate the bladder. Decaffeinated drinks are okay.  Avoid spicy foods.  Bladder Function For the first 10 days, empty the bladder whenever you feel a definite desire. Do not try to hold the urine for long periods of time.  Urinating once or twice a night even after you are healed is not uncommon.  You may see some recurrence of blood in the urine after discharge from the hospital. This usually happens within 2 weeks after the procedure.If this occurs, force fluids again as you did in the hospital and reduce your activity.  Bowel Function You may experience some constipation after surgery. This can be minimized by increasing fluids and fiber in your diet. Drink enough water and fluids to keep your urine clear or pale yellow.  A stool softener may be prescribed for use at home. Do not strain to move your bowels.  If you are requiring increased pain medicine, it is important that you take stool softeners to prevent constipation. This will help to promote proper healing by reducing the need to strain to move your bowels.  Sexual Activity Semen movement in the opposite direction and into the bladder (retrograde ejaculation) may occur. Since the semen passes into the bladder, cloudy urine can occur the first time you urinate after intercourse.  Or, you may not have an ejaculation during erection. Ask your caregiver when you can resume sexual activity. Retrograde ejaculation and reduced semen discharge should not reduce one's pleasure of intercourse.  Postoperative Visit Arrange the date and time of your after surgery visit with your caregiver.  Return to Work After your recovery is complete, you will be able to  return to work and resume all activities. Your caregiver will inform you when you can return to work.  Foley Catheter Care A soft, flexible tube (Foley catheter) may have been placed in your bladder to drain urine and fluid. Follow these instructions: Taking Care of the Catheter Keep the area where the catheter leaves your body clean.  Attach the catheter to the leg so there is no tension on the catheter.  Keep the drainage bag below the level of the bladder, but keep it OFF the floor.  Do not take long soaking baths. Your caregiver will give instructions about showering.  Wash your hands before touching ANYTHING related to the catheter or bag.  Using mild soap and warm water on a washcloth:  Clean the area closest to the catheter insertion site using a circular motion around the catheter.  Clean the catheter itself by wiping AWAY from the insertion site for several inches down the tube.  NEVER wipe upward as this could sweep bacteria up into the urethra (tube in your body that normally drains the bladder) and cause infection.  Place a small amount of sterile lubricant at the tip of the penis where the catheter is entering.  Taking Care of the Drainage Bags Two drainage bags may be taken home: a large overnight drainage bag, and a smaller leg bag which fits underneath clothing.  It is okay to wear the overnight bag at any time, but NEVER wear the smaller leg bag at night.  Keep the drainage bag well below the level of your bladder. This prevents backflow of urine into the bladder and allows the urine to drain freely.  Anchor the tubing to your leg to prevent pulling or tension on the catheter. Use tape or a leg strap provided by the hospital.  Empty the drainage bag when it is 1/2 to 3/4 full. Wash your hands before and after touching the bag.  Periodically check the tubing for kinks to make sure there is no pressure on the tubing which could restrict the flow of urine.  Changing the Drainage  Bags Cleanse both ends of the clean bag with alcohol before changing.  Pinch off the rubber catheter to avoid urine spillage during the disconnection.  Disconnect the dirty bag and connect the clean one.  Empty the dirty bag carefully to avoid a urine spill.  Attach the new bag to the leg with tape or a leg strap.  Cleaning the Drainage Bags Whenever a drainage bag is disconnected, it must be cleaned quickly so it is ready for the next use.  Wash the bag in warm, soapy water.  Rinse the bag thoroughly with warm water.  Soak the bag for 30 minutes in a solution of white vinegar and water (1 cup vinegar to 1 quart warm water).  Rinse with warm water.  SEEK MEDICAL CARE IF:  You have chills or night sweats.  You are leaking around your catheter or have problems with your catheter. It is not uncommon to have sporadic leakage around your catheter as a result of bladder spasms. If the leakage stops, there is not much need for concern.  If you are uncertain, call your caregiver.  You develop side effects that you think are coming from your medicines.  SEEK IMMEDIATE MEDICAL CARE IF:  You are suddenly unable to urinate. Check to see if there are any kinks in the drainage tubing that may cause this. If you cannot find any kinks, call your caregiver immediately. This is an emergency.  You develop shortness of breath or chest pains.  Bleeding persists or clots develop in your urine.  You have a fever.  You develop pain in your back or over your lower belly (abdomen).  You develop pain or swelling in your legs.  Any problems you are having get worse rather than better.  MAKE SURE YOU:  Understand these instructions.  Will watch your condition.  Will get help right away if you are not doing well or get worse.  Document Released: 09/05/2005 Document Revised: 05/18/2011 Document Reviewed: 04/29/2009 [facility] Patient Information 2012 [place].Transurethral Resection of the Prostate Care  After Refer to this sheet in the next few weeks. These discharge instructions provide you with general information on caring for yourself after you leave the hospital. Your caregiver may also give you specific instructions. Your treatment has been planned according to the most current medical practices available, but unavoidable complications sometimes occur. If you have any problems or questions after discharge, please call your caregiver.

## 2021-03-25 NOTE — Interval H&P Note (Signed)
History and Physical Interval Note:  03/25/2021 3:13 PM  Jared [name]  has presented today for surgery, with the diagnosis of [place].  The various methods of treatment have been discussed with the patient and family. After consideration of risks, benefits and other options for treatment, the patient has consented to  Procedure(s): TRANSURETHRAL RESECTION OF THE PROSTATE (TURP) (N/A) as a surgical intervention.  The patient's history has been reviewed, patient examined, no change in status, stable for surgery.  I have reviewed the patient's chart and labs.  Questions were answered to the patient's satisfaction.     Lillette Boxer Haddon Fyfe

## 2021-03-25 NOTE — Anesthesia Preprocedure Evaluation (Signed)
Anesthesia Evaluation  Patient identified by MRN, date of birth, ID band Patient awake    Reviewed: Allergy & Precautions, NPO status , Patient's Chart, lab work & pertinent test results  Airway Mallampati: II  TM Distance: >3 FB     Dental   Pulmonary asthma , sleep apnea , pneumonia,    breath sounds clear to auscultation       Cardiovascular hypertension, + CAD and + Past MI   Rhythm:Regular Rate:Normal     Neuro/Psych    GI/Hepatic Neg liver ROS, GERD  ,  Endo/Other  Hypothyroidism   Renal/GU Renal disease     Musculoskeletal   Abdominal   Peds  Hematology   Anesthesia Other Findings   Reproductive/Obstetrics                             Anesthesia Physical Anesthesia Plan  ASA: 3  Anesthesia Plan: General   Post-op Pain Management:    Induction: Intravenous  PONV Risk Score and Plan: 3 and Ondansetron, Dexamethasone and Midazolam  Airway Management Planned: LMA and Oral ETT  Additional Equipment:   Intra-op Plan:   Post-operative Plan:   Informed Consent: I have reviewed the patients History and Physical, chart, labs and discussed the procedure including the risks, benefits and alternatives for the proposed anesthesia with the patient or authorized representative who has indicated his/her understanding and acceptance.     Dental advisory given  Plan Discussed with: CRNA and Anesthesiologist  Anesthesia Plan Comments:         Anesthesia Quick Evaluation

## 2021-03-25 NOTE — Interval H&P Note (Deleted)
History and Physical Interval Note:  03/25/2021 2:23 PM  Jared [name]  has presented today for surgery, with the diagnosis of [place].  The various methods of treatment have been discussed with the patient and family. After consideration of risks, benefits and other options for treatment, the patient has consented to  Procedure(s) with comments: [place] (TURP) (N/A) - 90 MINS as a surgical intervention.  The patient's history has been reviewed, patient examined, no change in status, stable for surgery.  I have reviewed the patient's chart and labs.  Questions were answered to the patient's satisfaction.     Lillette Boxer Lia Vigilante

## 2021-03-25 NOTE — Transfer of Care (Signed)
Immediate Anesthesia Transfer of Care Note  Patient: Jared [name]  Procedure(s) Performed: TRANSURETHRAL RESECTION OF THE PROSTATE (TURP) (Prostate)  Patient Location: PACU  Anesthesia Type:General  Level of Consciousness: drowsy and patient cooperative  Airway & Oxygen Therapy: Patient Spontanous Breathing and Patient connected to face mask oxygen  Post-op Assessment: Report given to RN and Post -op Vital signs reviewed and stable  Post vital signs: Reviewed and stable  Last Vitals:  Vitals Value Taken Time  BP 157/103 03/25/21 1525  Temp    Pulse 91 03/25/21 1526  Resp 17 03/25/21 1526  SpO2 96 % 03/25/21 1526  Vitals shown include unvalidated device data.  Last Pain:  Vitals:   03/25/21 1201  TempSrc: Oral  PainSc: 0-No pain      Patients Stated Pain Goal: 5 ([id number])  Complications: No notable events documented.

## 2021-03-25 NOTE — Anesthesia Procedure Notes (Signed)
Procedure Name: LMA Insertion Date/Time: 03/25/2021 2:24 PM Performed by: Georgeanne Nim, CRNA Pre-anesthesia Checklist: Patient identified, Emergency Drugs available, Suction available, Patient being monitored and Timeout performed Patient Re-evaluated:Patient Re-evaluated prior to induction Oxygen Delivery Method: Circle system utilized Preoxygenation: Pre-oxygenation with 100% oxygen Induction Type: IV induction Ventilation: Mask ventilation without difficulty LMA: LMA inserted LMA Size: 4.0 Number of attempts: 1 Placement Confirmation: positive ETCO2, CO2 detector and breath sounds checked- equal and bilateral Tube secured with: Tape Dental Injury: Teeth and Oropharynx as per pre-operative assessment

## 2021-03-26 ENCOUNTER — Encounter: Payer: Self-pay | Admitting: Family Medicine

## 2021-03-26 ENCOUNTER — Ambulatory Visit: Payer: Medicare Other | Admitting: Family Medicine

## 2021-03-26 ENCOUNTER — Other Ambulatory Visit: Payer: Self-pay

## 2021-03-26 VITALS — BP 122/78 | HR 78 | Resp 16 | Ht 67.5 in

## 2021-03-26 DIAGNOSIS — J302 Other seasonal allergic rhinitis: Secondary | ICD-10-CM | POA: Diagnosis not present

## 2021-03-26 DIAGNOSIS — J3089 Other allergic rhinitis: Secondary | ICD-10-CM

## 2021-03-26 DIAGNOSIS — J4551 Severe persistent asthma with (acute) exacerbation: Secondary | ICD-10-CM | POA: Diagnosis not present

## 2021-03-26 DIAGNOSIS — K219 Gastro-esophageal reflux disease without esophagitis: Secondary | ICD-10-CM

## 2021-03-26 DIAGNOSIS — C61 Malignant neoplasm of prostate: Secondary | ICD-10-CM | POA: Diagnosis not present

## 2021-03-26 MED ORDER — HYDROCODONE-ACETAMINOPHEN 5-325 MG PO TABS
ORAL_TABLET | ORAL | Status: AC
  Filled 2021-03-26: qty 2

## 2021-03-26 MED ORDER — ESOMEPRAZOLE MAGNESIUM 40 MG PO CPDR: 40.0000 mg | DELAYED_RELEASE_CAPSULE | Freq: Every day | ORAL | 5 refills | Status: DC

## 2021-03-26 MED ORDER — METHYLPREDNISOLONE ACETATE 80 MG/ML IJ SUSP
80.0000 mg | Freq: Once | INTRAMUSCULAR | Status: AC
  Administered 2021-03-26: 80 mg via INTRAMUSCULAR

## 2021-03-26 MED ORDER — ALBUTEROL SULFATE HFA 108 (90 BASE) MCG/ACT IN AERS: INHALATION_SPRAY | RESPIRATORY_TRACT | 1 refills | Status: DC

## 2021-03-26 MED ORDER — CIPROFLOXACIN HCL 500 MG PO TABS: 500.0000 mg | ORAL_TABLET | Freq: Two times a day (BID) | ORAL | 0 refills | Status: AC

## 2021-03-26 MED ORDER — FAMOTIDINE 20 MG PO TABS: ORAL_TABLET | ORAL | 5 refills | Status: DC

## 2021-03-26 MED ORDER — SPACER/AERO-HOLDING CHAMBERS DEVI: 1.0000 | Freq: Once | 0 refills | Status: AC

## 2021-03-26 MED ORDER — FLUTICASONE PROPIONATE HFA 110 MCG/ACT IN AERO: INHALATION_SPRAY | RESPIRATORY_TRACT | 5 refills | Status: DC

## 2021-03-26 NOTE — Progress Notes (Signed)
[place]  [place] [address] Dept: [phone]  FOLLOW UP NOTE  Patient ID: Jared [name], male    DOB: [birth year]  Age: 70 y.o. MRN: [id number] Date of Office Visit: 03/23/2021  Assessment  Chief Complaint: Asthma (ACT-12 Acid reflux cause an asthma flare a couple of weeks ago ) and Other (Last Xolair shot was in April. Would like to talk about injections )  HPI Jared [name] is a 70 year old male who presents the clinic for follow-up visit.  He was last seen in this clinic on 07/21/2020 for evaluation of asthma with acute exacerbation, reflux, and allergic rhinitis.  At today's visit, he reports that began to experience heartburn and reflux about 1 week ago which triggered his asthma.  He reports experiencing shortness of breath with activity and rest, wheeze in the daytime and the nighttime, and cough producing mucus for the last several days.  He continues montelukast 10 mg once a day and albuterol via nebulizer every 8 hours for the last week.  Prior to last week he has not used albuterol in several months. He reports that he does not use a maintenance asthma inhaler.  He last received a Depo-Medrol injection on 07/21/2020 for asthma with acute exacerbation with relief of symptoms.  His last Xolair injection was in April 2020.    He reports that he forgot to come to the clinic to get his injection.  Allergic rhinitis is reported as moderately well controlled with occasional sneezing in the morning.  He is not currently using nasal steroid spray, saline nasal spray, or antihistamine.  Reflux is reported as poorly controlled with heartburn occurring several days a week.  He had been taking is omeprazole, however, ran out of this medication about 1 week ago.  His current medications are listed in the chart. Of note, he agrees to restart Xolair injections next week when feeling better in addition to beginning a daily asthma maintenance inhaler.  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    Sulfonamide Derivatives Hives and Itching   Codeine Nausea Only   Statins Other (See Comments)    Myalgias    Physical Exam: BP 122/78   Pulse 78   Resp 16   Ht 5' 7.5" (1.715 m)   SpO2 95%   BMI 32.00 kg/m    Physical Exam Vitals reviewed.  Constitutional:      Appearance: Normal appearance.  HENT:     Head: Normocephalic and atraumatic.     Right Ear: Tympanic membrane normal.     Left Ear: Tympanic membrane normal.     Nose:     Comments: Bilateral nares slightly erythematous with clear nasal drainage.  Pharynx normal.  Ears normal.  Eyes normal.    Mouth/Throat:  Pharynx: Oropharynx is clear.  Eyes:     Conjunctiva/sclera: Conjunctivae normal.  Cardiovascular:     Rate and Rhythm: Normal rate and regular rhythm.     Heart sounds: Normal heart sounds. No murmur heard. Pulmonary:     Effort: Pulmonary effort is normal.     Breath sounds: Normal breath sounds.     Comments: Lungs clear to auscultation Musculoskeletal:        General: Normal range of motion.     Cervical back: Normal range of motion and neck supple.  Skin:    General: Skin is warm and dry.  Neurological:     Mental Status: He is alert and oriented to person, place,  and time.  Psychiatric:        Mood and Affect: Mood normal.        Behavior: Behavior normal.        Thought Content: Thought content normal.        Judgment: Judgment normal.    Diagnostics: FVC 2.88, FEV1 2.29.  Predicted FVC 3.85, predicted FEV1 2.80.  Spirometry indicates mild restriction.  Assessment and Plan: 1. Severe persistent asthma with acute exacerbation   2. Seasonal and perennial allergic rhinitis   3. LPRD (laryngopharyngeal reflux disease)     Meds ordered this encounter  Medications   albuterol (VENTOLIN HFA) 108 (90 Base) MCG/ACT inhaler    Sig: Can inhale two puffs every four to six hours as needed for cough, wheeze, shortness of breath, or chest tightness.    Dispense:  18 g    Refill:  1   fluticasone (FLOVENT HFA) 110 MCG/ACT inhaler    Sig: 2 puffs twice a day with a spacer to prevent cough or wheeze. Rinse mouth after use.    Dispense:  1 each    Refill:  5   Spacer/Aero-Holding Chambers DEVI    Sig: 1 each by Does not apply route once for 1 dose.    Dispense:  1 each    Refill:  0   esomeprazole (NEXIUM) 40 MG capsule    Sig: Take 1 capsule (40 mg total) by mouth daily at 12 noon.    Dispense:  30 capsule    Refill:  5   famotidine (PEPCID) 20 MG tablet    Sig: Take 1 tablet by mouth twice a day if your reflux is not controlled by Nexium    Dispense:  60 tablet    Refill:  5   methylPREDNISolone acetate (DEPO-MEDROL) injection 80 mg    Patient Instructions  Asthma Begin Flovent 110-2 puffs twice a day with a spacer to prevent cough or wheeze.   Restart Xolair injections once a month to control asthma Continue albuterol 2 puffs every 4 hours as needed for cough or wheeze OR Instead use albuterol 0.083% solution via nebulizer one unit vial every 4 hours as needed for cough or wheeze Depo-Medrol 80 given once in the clinic Side effects of steroids thoroughly discussed  Allergic rhinitis Continue allergen avoidance measures Consider saline  nasal rinses as needed for nasal symptoms. Use this before any medicated nasal sprays for best result  Reflux Continue dietary lifestyle modifications as listed below Restart Nexium 40 mg once a day as previously prescribed You may add famotidine 20 mg twice a day if your reflux is not controlled by Nexium  Call the clinic if this treatment plan is not working well for you  Follow up in 2 months or sooner if needed.  Return in  about 2 months (around 05/27/2021), or if symptoms worsen or fail to improve.    Thank you for the opportunity to care for this patient.  Please do not hesitate to contact me with questions.  Gareth Morgan, FNP [facility]

## 2021-03-26 NOTE — Patient Instructions (Addendum)
Asthma Begin Flovent 110-2 puffs twice a day with a spacer to prevent cough or wheeze.   Restart Xolair injections once a month to control asthma Continue albuterol 2 puffs every 4 hours as needed for cough or wheeze OR Instead use albuterol 0.083% solution via nebulizer one unit vial every 4 hours as needed for cough or wheeze Depo-Medrol 80 given once in the clinic Side effects of steroids thoroughly discussed  Allergic rhinitis Continue allergen avoidance measures Consider saline nasal rinses as needed for nasal symptoms. Use this before any medicated nasal sprays for best result  Reflux Continue dietary lifestyle modifications as listed below Restart Nexium 40 mg once a day as previously prescribed You may add famotidine 20 mg twice a day if your reflux is not controlled by Nexium  Call the clinic if this treatment plan is not working well for you  Follow up in 2 months or sooner if needed.

## 2021-03-27 ENCOUNTER — Encounter: Payer: Self-pay | Admitting: Family Medicine

## 2021-03-29 ENCOUNTER — Encounter (HOSPITAL_BASED_OUTPATIENT_CLINIC_OR_DEPARTMENT_OTHER): Payer: Self-pay | Admitting: Urology

## 2021-03-29 ENCOUNTER — Ambulatory Visit: Payer: Medicare Other | Admitting: Podiatry

## 2021-03-30 ENCOUNTER — Other Ambulatory Visit: Payer: Self-pay

## 2021-03-30 ENCOUNTER — Ambulatory Visit (INDEPENDENT_AMBULATORY_CARE_PROVIDER_SITE_OTHER): Payer: Medicare Other | Admitting: *Deleted

## 2021-03-30 ENCOUNTER — Ambulatory Visit: Payer: Medicare Other | Admitting: Podiatry

## 2021-03-30 DIAGNOSIS — J4550 Severe persistent asthma, uncomplicated: Secondary | ICD-10-CM | POA: Diagnosis not present

## 2021-03-30 LAB — SURGICAL PATHOLOGY

## 2021-03-30 MED ORDER — OMALIZUMAB 150 MG ~~LOC~~ SOLR
150.0000 mg | SUBCUTANEOUS | Status: AC
  Administered 2021-03-30 – 2022-06-20 (×15): 150 mg via SUBCUTANEOUS

## 2021-04-02 ENCOUNTER — Other Ambulatory Visit: Payer: Self-pay

## 2021-04-02 ENCOUNTER — Encounter: Payer: Self-pay | Admitting: Podiatry

## 2021-04-02 ENCOUNTER — Ambulatory Visit: Payer: Medicare Other | Admitting: Podiatry

## 2021-04-02 ENCOUNTER — Other Ambulatory Visit: Payer: Self-pay | Admitting: Podiatry

## 2021-04-02 ENCOUNTER — Ambulatory Visit (INDEPENDENT_AMBULATORY_CARE_PROVIDER_SITE_OTHER): Payer: Medicare Other

## 2021-04-02 DIAGNOSIS — M779 Enthesopathy, unspecified: Secondary | ICD-10-CM

## 2021-04-02 DIAGNOSIS — M778 Other enthesopathies, not elsewhere classified: Secondary | ICD-10-CM

## 2021-04-02 DIAGNOSIS — M79671 Pain in right foot: Secondary | ICD-10-CM

## 2021-04-02 DIAGNOSIS — M79672 Pain in left foot: Secondary | ICD-10-CM

## 2021-04-02 MED ORDER — TRIAMCINOLONE ACETONIDE 10 MG/ML IJ SUSP
10.0000 mg | Freq: Once | INTRAMUSCULAR | Status: AC
  Administered 2021-04-02: 10 mg

## 2021-04-02 NOTE — Progress Notes (Signed)
Subjective:   Patient ID: [name], male   DOB: 70 y.o.   MRN: [id number]   HPI Patient presents stating that he is getting discomfort in his forefoot right over left with a tightness feeling that is been bothering him.  States he does not remember specific other injury but both feet are bothering him recently   ROS      Objective:  Physical Exam  Neurovascular status intact with inflammation occurring in the second third MPJs right over left low-grade to moderate rated intensity     Assessment:  Possibility for inflammatory capsular flareup or other pathology     Plan:  Sterile prep injected the capsules of the second and third MPJ 3 mg dexamethasone Kenalog 5 g liken right advised on anti-inflammatory to be taken as needed along with rigid bottom shoes and reappoint as indicated  X-rays were negative for signs of fracture appears to be soft tissue not bony at this time

## 2021-04-02 NOTE — Progress Notes (Signed)
g

## 2021-04-05 NOTE — Anesthesia Postprocedure Evaluation (Signed)
Anesthesia Post Note  Patient: Jared [name]  Procedure(s) Performed: TRANSURETHRAL RESECTION OF THE PROSTATE (TURP) (Prostate)     Patient location during evaluation: PACU Anesthesia Type: General Level of consciousness: awake Pain management: pain level controlled Vital Signs Assessment: post-procedure vital signs reviewed and stable Respiratory status: spontaneous breathing Cardiovascular status: stable Postop Assessment: no apparent nausea or vomiting Anesthetic complications: no   No notable events documented.  Last Vitals:  Vitals:   04/07/2021 0458 03/24/2021 0900  BP: (!) 151/74 136/79  Pulse: 82 80  Resp: 20 14  Temp: 36.6 C 36.7 C  SpO2: 95% 95%    Last Pain:  Vitals:   03/30/2021 1000  TempSrc:   PainSc: 0-No pain                 Terris Germano

## 2021-04-19 NOTE — Death Summary Note (Signed)
Patient ID: Jared [name] MRN: [id number] DOB/AGE: [birth year] 70 y.o.  Admit date: 03/25/2021 Discharge date: 04/06/2021  Primary Care Physician:  Burnard Bunting, MD  Discharge Diagnoses:   Present on Admission:  BPH with obstruction/lower urinary tract symptoms   Consults:  None     Discharge Medications:    Significant Diagnostic Studies:  No results found.  Brief H and P: For complete details please refer to admission H and P, but in brief the patient is admitted for TURP for management of symptomatic BPH  Hospital Course: He had an uncomplicated postoperative course and was discharged on postoperative day #1. Active Problems:   BPH with obstruction/lower urinary tract symptoms   Day of Discharge BP (!) 151/74 (BP Location: Right Arm)   Pulse 82   Temp 97.9 F (36.6 C)   Resp 20   Ht 5' 7.5" (1.715 m)   Wt 94.1 kg   SpO2 95%   BMI 32.00 kg/m   Results for orders placed or performed during the hospital encounter of 03/25/21 (from the past 24 hour(s))  I-STAT, chem 8     Status: Abnormal   Collection Time: 03/25/21 12:07 PM  Result Value Ref Range   Sodium 140 135 - 145 mmol/L   Potassium 4.4 3.5 - 5.1 mmol/L   Chloride 106 98 - 111 mmol/L   BUN 29 (H) 8 - 23 mg/dL   Creatinine, Ser 1.10 0.61 - 1.24 mg/dL   Glucose, Bld 106 (H) 70 - 99 mg/dL   Calcium, Ion 1.30 1.15 - 1.40 mmol/L   TCO2 21 (L) 22 - 32 mmol/L   Hemoglobin 15.0 13.0 - 17.0 g/dL   HCT 44.0 39.0 - 52.0 %    Physical Exam: General: Alert and awake oriented x3 not in any acute distress. HEENT: anicteric sclera, pupils reactive to light and accommodation CVS: S1-S2 clear no murmur rubs or gallops Chest: clear to auscultation bilaterally, no wheezing rales or rhonchi Abdomen: soft nontender, nondistended, normal bowel sounds, no organomegaly Extremities: no cyanosis, clubbing or edema noted bilaterally Neuro: Cranial nerves II-XII intact, no focal neurological deficits  Disposition:  Home  Diet: Regular  Activity: Restrictions discussed with patient and wife   TESTS THAT NEED FOLLOW-UP  Pathology result  [place]     Karen Kays, NP Follow up.   Specialty: Nurse Practitioner Why: 7.28.2022 @ 1 pm Contact information: [address] [place] [address] [phone]                 Time spent on Discharge:  10 minutes  Signed: Lillette Boxer Wendal Wilkie 03/25/2021, 7:55 AM

## 2021-04-19 DEATH — deceased

## 2021-04-20 ENCOUNTER — Telehealth: Payer: Self-pay | Admitting: Family Medicine

## 2021-04-20 NOTE — Telephone Encounter (Signed)
Called the patient back and he has been having difficulty with sleeping at night. He has been taking melatonin 20 mg at bedtime and it has not been helping states that he tosses and turns and wakes up throughout the night. Pt is asking if something could be called in to help. He wanted to make sure his download for CPAP looked good and I informed that it did look good. So we were able to rule that out as a reason.  Informed the patient I would talk with Dr Brett Fairy and see what she recommends

## 2021-04-20 NOTE — Telephone Encounter (Signed)
Pt called stating that he would like to discuss his cpap machine and how he has not been able to sleep well in the last 4 weeks. Please advise.

## 2021-04-21 MED ORDER — TRAZODONE HCL 50 MG PO TABS: 25.0000 mg | ORAL_TABLET | Freq: Every evening | ORAL | 5 refills | Status: DC | PRN

## 2021-04-21 NOTE — Addendum Note (Signed)
Addended by: Darleen Crocker on: 04/21/2021 09:05 AM   Modules accepted: Orders

## 2021-04-21 NOTE — Telephone Encounter (Signed)
Called the pt to make him aware, I spoke with Dr Brett Fairy and she agreed to the plan to send a medication trazodone for the patient. 25-50 mg at bedtime. Advised was sent to the pharmacy for him. Patient was appreciative for the call.

## 2021-04-27 ENCOUNTER — Ambulatory Visit (INDEPENDENT_AMBULATORY_CARE_PROVIDER_SITE_OTHER): Payer: Medicare Other | Admitting: *Deleted

## 2021-04-27 ENCOUNTER — Telehealth: Payer: Self-pay | Admitting: Family Medicine

## 2021-04-27 ENCOUNTER — Other Ambulatory Visit: Payer: Self-pay

## 2021-04-27 DIAGNOSIS — F5105 Insomnia due to other mental disorder: Secondary | ICD-10-CM

## 2021-04-27 DIAGNOSIS — J4550 Severe persistent asthma, uncomplicated: Secondary | ICD-10-CM | POA: Diagnosis not present

## 2021-04-27 DIAGNOSIS — G4733 Obstructive sleep apnea (adult) (pediatric): Secondary | ICD-10-CM

## 2021-04-27 NOTE — Telephone Encounter (Signed)
LVM returning pt call. 

## 2021-04-27 NOTE — Telephone Encounter (Signed)
Pt called stating the traZODone (DESYREL) 50 MG tablet is not helping. Pt requesting a call back.

## 2021-04-28 NOTE — Telephone Encounter (Signed)
Pt returned call. Please call back.

## 2021-04-28 NOTE — Telephone Encounter (Signed)
Called pt back. Confirmed he is taking trazodone '50mg'$  po qhs along with melatonin '20mg'$ . He tried taking 2 tablets of trazodone ('20mg'$  total) last night but ineffective. He is averaging about 1-2 hours of sleep currently. Advised I will discuss with Dr. Brett Fairy to see if there is an alternative option and will call back. He verbalized understanding.   I also printed CPAP download from [facility] for Dr. Brett Fairy to review.

## 2021-04-28 NOTE — Addendum Note (Signed)
Addended by: Wyvonnia Lora on: 04/28/2021 12:16 PM   Modules accepted: Orders

## 2021-04-28 NOTE — Telephone Encounter (Signed)
Called pt back. Advised I spoke w/ Dr. Brett Fairy who recommends referring him to cognitive behavioral therapist for further eval/treatment since he has tried/failed multiple meds (trazodone, melatonin, Belsomra, ambien). Advised I marked referral urgent. He should be contacted to get appt set up, if not, he will call our office back.

## 2021-05-04 ENCOUNTER — Other Ambulatory Visit: Payer: Self-pay

## 2021-05-04 ENCOUNTER — Ambulatory Visit (INDEPENDENT_AMBULATORY_CARE_PROVIDER_SITE_OTHER): Payer: Medicare Other | Admitting: Family Medicine

## 2021-05-04 ENCOUNTER — Other Ambulatory Visit: Payer: Self-pay | Admitting: Allergy and Immunology

## 2021-05-04 ENCOUNTER — Encounter: Payer: Medicare Other | Admitting: Neurology

## 2021-05-04 ENCOUNTER — Telehealth: Payer: Self-pay | Admitting: Family Medicine

## 2021-05-04 VITALS — BP 123/71 | HR 74 | Ht 67.5 in | Wt 205.0 lb

## 2021-05-04 VITALS — BP 125/74 | HR 77 | Ht 67.5 in | Wt 205.0 lb

## 2021-05-04 DIAGNOSIS — F5105 Insomnia due to other mental disorder: Secondary | ICD-10-CM | POA: Diagnosis not present

## 2021-05-04 DIAGNOSIS — G4752 REM sleep behavior disorder: Secondary | ICD-10-CM | POA: Diagnosis not present

## 2021-05-04 DIAGNOSIS — G2111 Neuroleptic induced parkinsonism: Secondary | ICD-10-CM | POA: Diagnosis not present

## 2021-05-04 DIAGNOSIS — Z9989 Dependence on other enabling machines and devices: Secondary | ICD-10-CM

## 2021-05-04 DIAGNOSIS — G4733 Obstructive sleep apnea (adult) (pediatric): Secondary | ICD-10-CM

## 2021-05-04 DIAGNOSIS — G4750 Parasomnia, unspecified: Secondary | ICD-10-CM

## 2021-05-04 NOTE — Telephone Encounter (Signed)
Pt called back with his list of medications. Duloxetine 60 MG, Telmisartan 20 MG, Levothyroxine 0.025 MG, Amlodipine Besylate 10 MG, Aripiprazole 5 MG, Gemtesa 75 MG, Silodosin 8 MG, Isosorbide Mononitrate 30 MG, Esomeprazole Magnesium 40 MG, Montelukast 10 MG, Trazodone 50 MG.

## 2021-05-04 NOTE — Progress Notes (Signed)
PATIENT: Jared [name] DOB: 06-May-1951  REASON FOR VISIT: follow up HISTORY FROM: patient  Chief Complaint  Patient presents with   Follow-up    Worsening tremor, gait changes       HISTORY OF PRESENT ILLNESS: 05/05/21 ALL:  Jared [name] returns for follow up for tremor and OSA on CPAP. He is doing fairly well on CPAP therapy. He uses his machine most nights but admits that sometimes he forgets. He denies concerns with CPAP machine or supplies.   He feels that tremor is worse. He has noted left hand tremor at rest. He notices this when watching TV but endorses worsening with activity or anxiety. He also feels that he has had changes in how he walks. He feels that his feet move faster than his body. He feels the sensation that he is leaning forward. He has had one fall since last being seen and reports this being due to slipping down a hill while trying to pick tomatoes. He does not use assistive device. He does not usually feel off balance. He denies difficult eating or swallowing. No changes in memory. He continues to have a difficult time sleeping. He was restarted on trazodone but unsure if it is helping.   He continues to see psychiatry every 4-6 weeks. He reports not taking Abilify. He can't remember how long ago this was stopped. He reported taking it in 10/2020 but now feels that he has been off this medication for about a year.   He was scheduled for follow up with Dr Brett Fairy to discuss this concern but returned to my schedule in her absence. He is taking duloxetine '60mg'$  daily. He does not think psychiatry has added any new medications since last being seen. He reports PCP has changed GERD medications but unsure what he is taking. He does not think he is on Nexium or Pepcid.     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  He is followed by Dr. Deirdre Evener, psychiatry. At last follow-up 1 year ago, he reported  headaches had resolved after stopping duloxetine.  He reports that duloxetine 30 mg was restarted last year for worsening depression. Headaches have not returned.   He called our office yesterday to schedule CPAP follow-up and to report concerns of difficulty staying asleep. He does not feel rested in the mornings and tends to lie in bed longer than he would like. He feels that depression is well managed. He usually goes to bed around the same time every night. He feels that he tosses and turns all night then does not feel motivated to get up the next day. He may lay in bed for 10-12 hours. He continues to act out dreams. He often wakes himself up thinking that he has poured medications in his hand to take but nothing is there. His wife continues to note that he talks and has random movements in his sleep. He reports that psychiatry asked him to discuss sleep concerns with [place]. He was on Belsomra '20mg'$ , written by psychiatry, at last visit but is uncertain why this was stopped. He reports hallucinations in the past with Ambien. Trazodone was ineffective. He has never taken clonazepam. 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   Compliance report dated 09/26/2020 through 10/25/2020 reveals that he used CPAP 29 of the past 30 days for compliance of 97%.  He is CPAP greater than 4 hours 20 of  the past 30 days for compliance of 67%.  Average usage on days used was 6 hours and 6 minutes.  Residual AHI was 1.0 on 6 to 16 cm of water.  There was no significant leak noted.   10/17/2019 ALL:  Jared [name] is a 70 y.o. 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 Dr Jannifer Franklin increased duloxetine to '60mg'$  twice daily for headaches. Headaches continued and he reports that his PCP stopped duloxetine. Since, headaches have resolved. He continues to have intermittent shaking of left arm. Shaking can with activity or at rest. May happen several times a day or may not  happen for a day or two. Shaking lasts about 2-3 minutes then resolves. He has been on Ability for 6-7 years, followed by Dr Darryl Lent, psychiatry. Tremor started shortly afterwards. She feels that he is doing well overall and not bothered by shaking. He does not feel symptoms are worsening.   She was previously on CPAP therapy but needed a new CPAP machine. New machine delivered in 09/23/2019. He continued to use his old CPAP until 10/06/2019. He is doing well with new CPAP machine. He feels that it works well and he is feeling better. Compliance report does reveal consistent use since 10/06/2019. Residual AHI 2.4 on 6-16cmH20. No significant leak noted.   HISTORY: (copied from Dr Edwena Felty note on 08/28/2019)  Jared [name] is a 70 y.o. year old White or Caucasian male patient seen for a sleep consultation- the patient's primary neurologist is Dr Jannifer Franklin.  referral on 08/28/2019 from PCP  for a new evaluation.   Chief concern according to patient :  I am getting more and more fatigued, CPAP machine is 72 years old.    I have the pleasure of seeing Jared [name] today, a right-handed White or Caucasian male with a possible sleep disorder.  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  Patient reports headaches have improved, sleepiness is progressing.  Dr Jannifer Franklin started w treatment of severe depression with Cymbalta. Once a day and he sleeps better when  taking Hydralizine and Belsomra with Metta Clines.      Jared [name] was seen for a baseline diagnostic polysomnography on January 14, 2014 referred by Dr. Burnard Bunting and at the time less sleepy than today with the Epworth Sleepiness Scale endorsed at 9 out of 24 points.  Sleep efficiency was poor 70.6% and highly fragmented.  The patient had multiple hypopneas rather than apneas 66 respiratory events consisted of 55 shallow breathing spells 1 obstructive apnea and 4 central apneas and 6 mixed apneas with an AHI of 12.9.  The lowest oxygen saturation was 77% at nadir with 41 minutes of desaturation in total.  He also had frequent periodic limb movements at the time he also moved during REM sleep indicative of REM sleep behavior disorder.  He returned for CPAP titration on 06 Feb 2014 his AHI was reduced significantly and his periodic limb movements also improved.  Nadir was now 82% with only 2 minutes of desaturation time.  He started on 10 cmH2O pressure with  3 cm flex or air sense and a nasal pillow.  Normal sinus rhythm by EKG was no injury was noted.   Dr. Jannifer Franklin:  08-10-2019 History of present illness:   Jared [name] is a 70 year old right-handed white male with a history of renal cell carcinoma in the past, history of coronary artery disease, asthma, sleep apnea on CPAP, and history of depression on medications to include Abilify.  The patient claims that in early August he began having headaches in the right frontotemporal region.  He has never had any problems with headaches at any time in his life, headaches are very unusual for him.  The patient has noted that the headaches may last 30 minutes to an hour and a half, and may come and go up to 3 times a day, sometimes waking him up out of sleep.  The patient has no nausea, vomiting or photophobia or phonophobia with the headache.  He denies any vision changes or neck pain.  He may take Tylenol with good improvement of the headache.  The  headaches are usually a 5 or 6 out of 10 on a pain scale and are described as a dull achy pain.  The patient reports no sinus drainage or allergy symptoms.  He denies any numbness or weakness of the face, arms, legs.  He has noted recently some problems with tremor that will come and go involving the left upper extremity.  He denies issues controlling the bowels or the bladder.  He was sent to this office for an evaluation.  He recently had MRI of the brain that was relatively unremarkable, no source of the headache was seen.  The headaches are always on the right frontal temporal area, never anywhere else.  He does not get any redness of the eye or tearing of the eye with a headache.  The patient was placed on Lamisil around the time of onset of headache, but he ran out of his medication a week ago and he has continued to have headaches.   REVIEW OF SYSTEMS: Out of a complete 14 system review of symptoms, the patient complains only of the following symptoms, tremor left arm, headaches, depression, parasomnia, insomnia and all other reviewed systems are negative.  ESS: 8   ALLERGIES: Allergies  Allergen Reactions   Contrast Media [Iodinated Diagnostic Agents] Other (See Comments)    Was told not to take d/t pt only having 1 kidney   Nsaids Other (See Comments)    Was told not to take d/t pt only having 1 kidney   Penicillins Hives, Itching and Other (See Comments)    Has patient had a PCN reaction causing immediate rash, facial/tongue/throat swelling, SOB or lightheadedness with hypotension: Unknown Has patient had a PCN reaction causing severe rash involving mucus membranes or skin necrosis: Unknown Has patient had a PCN reaction that required hospitalization: Unknown Has patient had a PCN reaction occurring within the last 10 years: No If all of the above answers are "NO", then may proceed with Cephalosporin use.    Sulfonamide Derivatives Hives and Itching   Codeine Nausea Only   Statins  Other (See Comments)    Myalgias    HOME MEDICATIONS: Outpatient Medications Prior to Visit  Medication Sig Dispense Refill   albuterol (VENTOLIN HFA) 108 (90 Base) MCG/ACT inhaler Can inhale two puffs every four to six hours as needed for cough, wheeze, shortness of breath, or chest tightness. 18 g 1   Alirocumab (PRALUENT) 150 MG/ML SOAJ Inject 150 mg  into the skin every 14 (fourteen) days. 2 mL 11   ARIPiprazole (ABILIFY) 5 MG tablet Take 5 mg by mouth every morning.     aspirin 325 MG tablet Take 325 mg by mouth daily. Taking daily otc (Patient not taking: Reported on 05/04/2021)     DULoxetine (CYMBALTA) 30 MG capsule Take 60 mg by mouth every morning.     esomeprazole (NEXIUM) 40 MG capsule Take 1 capsule (40 mg total) by mouth daily at 12 noon. 30 capsule 5   fluticasone (FLOVENT HFA) 110 MCG/ACT inhaler 2 puffs twice a day with a spacer to prevent cough or wheeze. Rinse mouth after use. 1 each 5   ipratropium-albuterol (DUONEB) 0.5-2.5 (3) MG/3ML SOLN Take 3 mLs by nebulization every 6 (six) hours as needed (wheezing, coughing, shortness of breath). 360 mL 1   isosorbide mononitrate (IMDUR) 30 MG 24 hr tablet take 1 tablet by mouth once daily (Patient taking differently: at bedtime.) 15 tablet 0   Multiple Vitamin (MULTIVITAMIN) tablet Take 1 tablet by mouth at bedtime.      silodosin (RAPAFLO) 8 MG CAPS capsule Take 8 mg by mouth at bedtime.      Spacer/Aero-Holding Chambers [facility] DIAMOND) MISC Use as directed with inhaler. 1 each 0   telmisartan (MICARDIS) 20 MG tablet Take 20 mg by mouth daily.     traZODone (DESYREL) 50 MG tablet Take 0.5-1 tablets (25-50 mg total) by mouth at bedtime as needed for sleep. 30 tablet 5   vitamin C (ASCORBIC ACID) 500 MG tablet Take 500 mg by mouth 2 (two) times daily.      XOLAIR 150 MG injection INJECT '150MG'$  SUBCUTANEOUSLY EVERY 4 WEEKS (GIVEN AT MD  OFFICE) 1 each 11   amLODipine (NORVASC) 5 MG tablet Take 5 mg by mouth daily.      esomeprazole (NEXIUM) 20 MG capsule Take 20 mg by mouth 2 (two) times daily.     famotidine (PEPCID) 20 MG tablet Take 1 tablet by mouth twice a day if your reflux is not controlled by Nexium 60 tablet 5   finasteride (PROSCAR) 5 MG tablet Take 5 mg by mouth at bedtime.     levothyroxine (SYNTHROID) 50 MCG tablet Take 50 mcg by mouth every morning.     montelukast (SINGULAIR) 10 MG tablet TAKE 1 TABLET(10 MG) BY MOUTH AT BEDTIME 30 tablet 0   Vitamin D, Ergocalciferol, (DRISDOL) 1.25 MG (50000 UNIT) CAPS capsule Take 1 capsule (50,000 Units total) by mouth every 7 (seven) days. (Patient taking differently: Take 50,000 Units by mouth every 7 (seven) days. saturday) 4 capsule 0   Facility-Administered Medications Prior to Visit  Medication Dose Route Frequency Provider Last Rate Last Admin   omalizumab [name]) injection 150 mg  150 mg Subcutaneous Q28 days Dara Hoyer, FNP   150 mg at 04/27/21 1648    PAST MEDICAL HISTORY: Past Medical History:  Diagnosis Date   Anxiety    Asthma    Cervical vertebral fusion 05/02/2014   Now presenting with C5-6 radiculopathy   Coronary artery disease    COVID 01/2020   pain all over monoclonal antibodies given all symptoms resolved   GERD (gastroesophageal reflux disease)    H/O heart artery stent 2000   to right rca   Heart attack [facility]) 2009   History of depression 03/19/2021   History of esophageal stricture    History of spinal fracture 2015   Hyperlipidemia    Hypertension    Hypothyroidism  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[place])    left arm tremor   Osteoarthritis    lower back   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   Renal cell carcinoma 1997   Left kidney   Sleep apnea    wears CPAP   Sleep talking    Snoring 12/25/2013   Wears glasses     PAST SURGICAL HISTORY: Past Surgical History:  Procedure Laterality Date   ANTERIOR CERVICAL DECOMP/DISCECTOMY FUSION N/A 08/27/2014   Procedure: Cervical six-seven anterior  cervical decompression fusion with removal of hardware at Cervical five-six;  Surgeon: Erline Levine, MD;  Location: [place] [facility];  Service: Neurosurgery;  Laterality: N/A;  Cervical six-seven anterior cervical decompression fusion with removal of hardware at Cervical five-six   BACK SURGERY     1992, 2013 lower back   CARDIAC CATHETERIZATION  05/10/2007   Minimal CAD, normal LV systolic function, medical management   CARDIAC CATHETERIZATION  04/08/2008   RCA ulcerated 70-80% stenosis, stented with a 3x62m Endeavor stent at 13atm for 50sec, reduced from 80% ulcerated stenosis to 0%.   CARDIAC CATHETERIZATION  05/07/2009   50% distal left main disease-IVUS or flow wire too dangerous in particular setting to perform intervention.   CARDIAC CATHETERIZATION  03/18/2010   Medical management   CARDIOVASCULAR STRESS TEST  02/09/2012   Normal, no significant wall abnormalities noted   CARPAL TUNNEL RELEASE Bilateral 2000   COLONOSCOPY W/ BIOPSIES AND POLYPECTOMY     CORONARY STENT PLACEMENT     FEMUR FRACTURE SURGERY Left 1995   HERNIA REPAIR     as infant groin   NECK SURGERY  2005   NEPHRECTOMY Left 1997   secondary to cancer   TOTAL KNEE ARTHROPLASTY Left 2005   TOTAL KNEE ARTHROPLASTY Right 04/13/2018   Procedure: RIGHT TOTAL KNEE ARTHROPLASTY;  Surgeon: NNetta Cedars MD;  [place]  Service: Orthopedics;  Laterality: Right;   TRANSURETHRAL RESECTION OF PROSTATE N/A 03/25/2021   Procedure: TRANSURETHRAL RESECTION OF THE PROSTATE (TURP);  Surgeon: DFranchot Gallo MD;  [facility]  Service: Urology;  Laterality: N/A;    FAMILY HISTORY: Family History  Problem Relation Age of Onset   Asthma Mother    Heart disease Mother    Hypertension Mother    Thyroid disease Mother    Esophageal cancer Father    Barrett's esophagus Father    Bone cancer Father        mets from esophagus   Heart disease Father    Cancer Father    Depression Father    Anxiety  disorder Father    Alcoholism Father    Obesity Father    Heart disease Paternal Grandfather    Heart attack Paternal Grandfather 511  Colon cancer Maternal Uncle    Diabetes Sister    Cancer Sister        Cervical cancer   Stomach cancer Neg Hx     SOCIAL HISTORY: Social History   Socioeconomic History   Marital status: Married    Spouse name: Not on file   Number of children: 1   Years of education: 14   Highest education level: Not on file  Occupational History   Occupation: OWNER. Landscape Design/horticulture    Employer: [employer]   Occupation: OWNER    Employer: [employer]  Tobacco Use   Smoking status: Never   Smokeless tobacco: Never  Vaping Use   Vaping Use: Never used  Substance and Sexual Activity   Alcohol  use: Not Currently   Drug use: No   Sexual activity: Not Currently  Other Topics Concern   Not on file  Social History Narrative   Patient is married [name]).   Patient drinks one cup of coffee but not everyday.   Patient has one child.   Patient has a college education.   Patient is right-handed.   Social Determinants of Health   Financial Resource Strain: Not on file  Food Insecurity: Not on file  Transportation Needs: Not on file  Physical Activity: Not on file  Stress: Not on file  Social Connections: Not on file  Intimate Partner Violence: Not on file      PHYSICAL EXAM  Vitals:   05/04/21 0854  BP: 123/71  Pulse: 74  Weight: 205 lb (93 kg)  Height: 5' 7.5" (1.715 m)    Body mass index is 31.63 kg/m.  Generalized: Well developed, in no acute distress, masked face Cardiology: normal rate and rhythm, no murmur noted Respiratory: clear to auscultation bilaterally  Neurological examination  Mentation: Alert oriented to time, place, history taking. Follows all commands speech and language fluent Cranial nerve II-XII: Pupils were equal round reactive to light. Extraocular movements were full, visual field were full  on confrontational test. Facial sensation and strength were normal. Head turning and shoulder shrug  were normal and symmetric. Motor: The motor testing reveals 5 over 5 strength of all 4 extremities. Good symmetric motor tone is noted throughout. Intermittent resting tremor of left hand noted during exam, very mild postural tremor of bilateral hands and moderate action tremor noted of left hand with coordination testing. No obvious cogwheel rigidity. No bradykinesia noted.  Sensory: Sensory testing is intact to soft touch on all 4 extremities. No evidence of extinction is noted.  Coordination: Cerebellar testing reveals good finger-nose-finger and heel-to-shin bilaterally.  Gait and station: Able to push to standing position. Posture is very slightly stooped. Gait is narrow but stable without assistive device. Slight decrease in arm swing    DIAGNOSTIC DATA (LABS, IMAGING, TESTING) - I reviewed patient records, labs, notes, testing and imaging myself where available.  No flowsheet data found.   Lab Results  Component Value Date   WBC 8.1 10/28/2020   HGB 15.0 03/25/2021   HCT 44.0 03/25/2021   MCV 84 10/28/2020   PLT 298 10/28/2020      Component Value Date/Time   NA 140 03/25/2021 1207   NA 139 02/02/2021 1127   K 4.4 03/25/2021 1207   CL 106 03/25/2021 1207   CO2 21 02/02/2021 1127   GLUCOSE 106 (H) 03/25/2021 1207   BUN 29 (H) 03/25/2021 1207   BUN 27 02/02/2021 1127   CREATININE 1.10 03/25/2021 1207   CREATININE 1.41 (H) 06/25/2013 1051   CALCIUM 9.4 02/02/2021 1127   PROT 7.5 02/02/2021 1127   ALBUMIN 4.4 02/02/2021 1127   AST 15 02/02/2021 1127   ALT 30 02/02/2021 1127   ALKPHOS 82 02/02/2021 1127   BILITOT 0.3 02/02/2021 1127   GFRNONAA 49 (L) 10/28/2020 1127   GFRAA 56 (L) 10/28/2020 1127   Lab Results  Component Value Date   CHOL 150 03/16/2021   HDL 39 (L) 03/16/2021   LDLCALC 81 03/16/2021   TRIG 176 (H) 03/16/2021   CHOLHDL 3.8 03/16/2021   Lab Results   Component Value Date   HGBA1C 6.1 (H) 02/02/2021   Lab Results  Component Value Date   VITAMINB12 776 10/28/2020   Lab Results  Component Value Date  TSH 2.910 10/28/2020     ASSESSMENT AND PLAN 70 y.o. 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 here with     ICD-10-CM   1. 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 OSA on CPAP  G47.33    Z99.89     3. Insomnia due to mental condition  F51.05     4. RBD (REM behavioral disorder)  G47.52     5. 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 He reports discontinuation of Abilify but unable to tell me how long ago this was, reportedly taking in 10/2020. He has been on Abilify for 4-5 years. He is questioning concerns for primary parkinson's. Neuro exam with some findings consistent with parkinsonian syndrome. He has never had a formal DaT scan and requests that we order this for evaluation. He continues to tolerate CPAP therapy. Compliance report shows acceptable daily and 4 hour compliance. He was encouraged to continue using CPAP nightly and for 4 hours every night. He does continue to have difficulty with sleep. Now back on trazodone managed by psychiatry. Will also send to Dr Brett Fairy for review. He will continue close follow up with care team. He verbalizes understanding  and agreement with this plan.    No orders of the defined types were placed in this encounter.    No orders of the defined types were placed in this encounter.     I spent 30 minutes with the patient. 50% of this time was spent counseling and educating patient on plan of care and medications.     Debbora Presto, FNP-C 05/05/2021, 4:27 PM [facility] [address], [place], [place] [address] [phone]

## 2021-05-04 NOTE — Addendum Note (Signed)
Addended by: Wyvonnia Lora on: 05/04/2021 01:30 PM   Modules accepted: Orders

## 2021-05-04 NOTE — Patient Instructions (Signed)
Below is our plan:  Please call us with an updated medication list.   We will continue CPAP nightly and greater than 4 hours each night. I will discuss your case with Dr Brett Fairy. We will try to get a DaT scan ordered for you to determine any concerns of Parkinson's Disease. Please continue regular follow up with your PCP and psychiatry.   Please make sure you are staying well hydrated. I recommend 50-60 ounces daily. Well balanced diet and regular exercise encouraged. Consistent sleep schedule with 6-8 hours recommended.   Please continue follow up with care team as directed.   Follow up with Dr Brett Fairy pending review of case.   You may receive a survey regarding today's visit. I encourage you to leave honest feed back as I do use this information to improve patient care. Thank you for seeing me today!

## 2021-05-05 ENCOUNTER — Telehealth: Payer: Self-pay | Admitting: Family Medicine

## 2021-05-05 ENCOUNTER — Other Ambulatory Visit: Payer: Self-pay | Admitting: Family Medicine

## 2021-05-05 ENCOUNTER — Encounter: Payer: Self-pay | Admitting: Family Medicine

## 2021-05-05 DIAGNOSIS — R259 Unspecified abnormal involuntary movements: Secondary | ICD-10-CM

## 2021-05-05 NOTE — Progress Notes (Signed)
Dat scan

## 2021-05-05 NOTE — Telephone Encounter (Signed)
Received signed patient consent form for DaTscan. I have filled out intake forms and will give to NP to sign.

## 2021-05-06 NOTE — Telephone Encounter (Signed)
DaTscan referral faxed to [facility]. They will determine if PA is required and let me know the outcome. Phone: [phone].

## 2021-05-10 NOTE — Telephone Encounter (Signed)
Received approval from insurance. PA [id number] (05/09/21- 06/23/21). Sent to [place] at [facility] to schedule.

## 2021-05-14 ENCOUNTER — Other Ambulatory Visit: Payer: Self-pay | Admitting: *Deleted

## 2021-05-14 DIAGNOSIS — E7800 Pure hypercholesterolemia, unspecified: Secondary | ICD-10-CM

## 2021-05-15 LAB — HEPATIC FUNCTION PANEL
ALT: 30 IU/L (ref 0–44)
AST: 22 IU/L (ref 0–40)
Albumin: 4.4 g/dL (ref 3.7–4.7)
Alkaline Phosphatase: 82 IU/L (ref 44–121)
Bilirubin Total: 0.3 mg/dL (ref 0.0–1.2)
Bilirubin, Direct: 0.12 mg/dL (ref 0.00–0.40)
Total Protein: 7.4 g/dL (ref 6.0–8.5)

## 2021-05-15 LAB — LIPID PANEL
Chol/HDL Ratio: 4.2 ratio (ref 0.0–5.0)
Cholesterol, Total: 152 mg/dL (ref 100–199)
HDL: 36 mg/dL — ABNORMAL LOW (ref >39)
LDL Chol Calc (NIH): 77 mg/dL (ref 0–99)
Triglycerides: 237 mg/dL — ABNORMAL HIGH (ref 0–149)
VLDL Cholesterol Cal: 39 mg/dL (ref 5–40)

## 2021-05-18 ENCOUNTER — Ambulatory Visit (AMBULATORY_SURGERY_CENTER): Payer: Self-pay | Admitting: *Deleted

## 2021-05-18 ENCOUNTER — Other Ambulatory Visit: Payer: Self-pay

## 2021-05-18 ENCOUNTER — Ambulatory Visit: Payer: Medicare Other | Admitting: Cardiovascular Disease

## 2021-05-18 VITALS — Ht 66.0 in | Wt 204.0 lb

## 2021-05-18 DIAGNOSIS — Z86010 Personal history of colonic polyps: Secondary | ICD-10-CM

## 2021-05-18 MED ORDER — SUTAB 1479-225-188 MG PO TABS: 24.0000 | ORAL_TABLET | ORAL | 0 refills | Status: DC

## 2021-05-18 NOTE — Progress Notes (Signed)
No egg or soy allergy known to patient  No issues with past sedation with any surgeries or procedures Patient denies ever being told they had issues or difficulty with intubation  No FH of Malignant Hyperthermia No diet pills per patient No home 02 use per patient  No blood thinners per patient  Pt denies issues with constipation  No A fib or A flutter  EMMI video to pt or via [place] 19 guidelines implemented in [place] today with Pt and RN   Pt is fully vaccinated  for [employer] given to pt in PV today , Code to Pharmacy and  NO PA's for preps discussed with pt In PV today  Discussed with pt there will be an out-of-pocket cost for prep and that varies from $0 to 70 +  dollars   Due to the COVID-19 pandemic we are asking patients to follow certain guidelines.  Pt aware of COVID protocols and [facility] guidelines

## 2021-05-25 ENCOUNTER — Ambulatory Visit: Payer: Medicare Other | Admitting: Allergy and Immunology

## 2021-05-25 ENCOUNTER — Other Ambulatory Visit: Payer: Self-pay

## 2021-05-25 ENCOUNTER — Telehealth: Payer: Self-pay

## 2021-05-25 ENCOUNTER — Ambulatory Visit: Payer: Self-pay

## 2021-05-25 VITALS — BP 120/64 | HR 90 | Temp 97.3°F | Resp 16 | Ht 68.0 in | Wt 207.0 lb

## 2021-05-25 DIAGNOSIS — J3089 Other allergic rhinitis: Secondary | ICD-10-CM | POA: Diagnosis not present

## 2021-05-25 DIAGNOSIS — K219 Gastro-esophageal reflux disease without esophagitis: Secondary | ICD-10-CM | POA: Diagnosis not present

## 2021-05-25 DIAGNOSIS — J4550 Severe persistent asthma, uncomplicated: Secondary | ICD-10-CM | POA: Diagnosis not present

## 2021-05-25 DIAGNOSIS — J302 Other seasonal allergic rhinitis: Secondary | ICD-10-CM | POA: Diagnosis not present

## 2021-05-25 MED ORDER — ALBUTEROL SULFATE HFA 108 (90 BASE) MCG/ACT IN AERS: INHALATION_SPRAY | RESPIRATORY_TRACT | 1 refills | Status: DC

## 2021-05-25 MED ORDER — AEROCHAMBER PLUS MISC: 2 refills | Status: DC

## 2021-05-25 MED ORDER — IPRATROPIUM-ALBUTEROL 0.5-2.5 (3) MG/3ML IN SOLN
3.0000 mL | Freq: Four times a day (QID) | RESPIRATORY_TRACT | 1 refills | Status: DC | PRN
Start: 2021-05-25 — End: 2022-12-20

## 2021-05-25 MED ORDER — FLUTICASONE PROPIONATE HFA 110 MCG/ACT IN AERO: INHALATION_SPRAY | RESPIRATORY_TRACT | 5 refills | Status: DC

## 2021-05-25 MED ORDER — MONTELUKAST SODIUM 10 MG PO TABS: ORAL_TABLET | ORAL | 1 refills | Status: DC

## 2021-05-25 MED ORDER — ESOMEPRAZOLE MAGNESIUM 40 MG PO CPDR: 40.0000 mg | DELAYED_RELEASE_CAPSULE | Freq: Every day | ORAL | 5 refills | Status: DC

## 2021-05-25 NOTE — Telephone Encounter (Signed)
Patient called stating Dr Neldon Mc wanted him to start using a spacer with his Flovent. Patient didn't receive one in office or at the pharmacy.   Please send to [facility]

## 2021-05-25 NOTE — Progress Notes (Signed)
[place]   Follow-up Note  Referring Provider: Burnard Bunting, MD Primary Provider: Burnard Bunting, MD Date of Office Visit: 05/25/2021  Subjective:   Jared [name] (DOB: 02-13-1951) is a 70 y.o. male who returns to [facility] on 05/25/2021 in re-evaluation of the following:  HPI: Ibin returns to this clinic in reevaluation of severe asthma, allergic rhinitis, and reflux induced respiratory disease.  I have not seen him in this clinic since 03 July 2018 but he has been seen by multiple providers in our office with his last visit occurring 26 March 2021.  Overall he has done well since he restarted his Xolair and restarted his Flovent on a consistent basis.  He has had very little problems with his asthma and does not need to use a short acting bronchodilator.  He has had no problems with his nose and does not require any specific medications for this issue.  His reflux is under very good control while using Nexium once a day.  He has received a total of 3 COVID vaccines and was hospitalized for COVID February 2021.  He is being evaluated for parkinsonism and has a brain MRI scheduled within the next month  Allergies as of 05/25/2021       Reactions   Contrast Media [iodinated Diagnostic Agents] Other (See Comments)   Was told not to take d/t pt only having 1 kidney   Nsaids Other (See Comments)   Was told not to take d/t pt only having 1 kidney   Penicillins Hives, Itching, Other (See Comments)   Has patient had a PCN reaction causing immediate rash, facial/tongue/throat swelling, SOB or lightheadedness with hypotension: Unknown Has patient had a PCN reaction causing severe rash involving mucus membranes or skin necrosis: Unknown Has patient had a PCN reaction that required hospitalization: Unknown Has patient had a PCN reaction occurring within the last 10 years: No If all of the above answers are  "NO", then may proceed with Cephalosporin use.   Sulfonamide Derivatives Hives, Itching   Codeine Nausea Only   Statins Other (See Comments)   Myalgias        Medication List    albuterol 108 (90 Base) MCG/ACT inhaler Commonly known as: VENTOLIN HFA Can inhale two puffs every four to six hours as needed for cough, wheeze, shortness of breath, or chest tightness.   amLODipine 10 MG tablet Commonly known as: NORVASC Take 1 tablet by mouth daily.   ARIPiprazole 5 MG tablet Commonly known as: ABILIFY Take 5 mg by mouth every morning.   DULoxetine 60 MG capsule Commonly known as: CYMBALTA Take 60 mg by mouth every morning.   esomeprazole 40 MG capsule Commonly known as: NexIUM Take 1 capsule (40 mg total) by mouth daily at 12 noon.   fluticasone 110 MCG/ACT inhaler Commonly known as: FLOVENT HFA 2 puffs twice a day with a spacer to prevent cough or wheeze. Rinse mouth after use.   Gemtesa 75 MG Tabs Generic drug: Vibegron Take 1 Dose by mouth daily.   ipratropium-albuterol 0.5-2.5 (3) MG/3ML Soln Commonly known as: DUONEB Take 3 mLs by nebulization every 6 (six) hours as needed (wheezing, coughing, shortness of breath).   isosorbide mononitrate 30 MG 24 hr tablet Commonly known as: IMDUR take 1 tablet by mouth once daily   levothyroxine 25 MCG tablet Commonly known as: SYNTHROID Take 25 mcg by mouth every morning.   montelukast 10 MG tablet  Commonly known as: SINGULAIR TAKE 1 TABLET(10 MG) BY MOUTH AT BEDTIME   multivitamin tablet Take 1 tablet by mouth at bedtime.   optichamber diamond Misc Use as directed with inhaler.   Praluent 150 MG/ML Soaj Generic drug: Alirocumab Inject 150 mg into the skin every 14 (fourteen) days.   silodosin 8 MG Caps capsule Commonly known as: RAPAFLO Take 8 mg by mouth at bedtime.   Sutab [phone] MG Tabs Generic drug: Sodium Sulfate-Mag Sulfate-KCl Take 24 tablets by mouth as directed. Pt has a Medicare Sutab coupon  to bring to Pharmacy   telmisartan 20 MG tablet Commonly known as: MICARDIS Take 20 mg by mouth daily.   traZODone 50 MG tablet Commonly known as: DESYREL Take 0.5-1 tablets (25-50 mg total) by mouth at bedtime as needed for sleep.   vitamin C 500 MG tablet Commonly known as: ASCORBIC ACID Take 500 mg by mouth 2 (two) times daily.   VITAMIN D PO Take 50,000 Units by mouth once a week.   Xolair 150 MG injection Generic drug: omalizumab INJECT '150MG'$  SUBCUTANEOUSLY EVERY 4 WEEKS (GIVEN AT MD  OFFICE)    Past Medical History:  Diagnosis Date   Anxiety    Asthma    Cervical vertebral fusion 05/02/2014   Now presenting with C5-6 radiculopathy   Coronary artery disease    COVID 01/2020   pain all over monoclonal antibodies given all symptoms resolved   GERD (gastroesophageal reflux disease)    H/O heart artery stent 2000   to right rca   Heart attack ([place]) 2009   History of depression 03/19/2021   History of esophageal stricture    History of spinal fracture 2015   Hyperlipidemia    Hypertension    Hypothyroidism    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[place])    left arm tremor   Osteoarthritis    lower back   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   Renal cell carcinoma 1997   Left kidney   Sleep apnea    wears CPAP   Sleep talking    Snoring 12/25/2013   Wears glasses     Past Surgical History:  Procedure Laterality Date   ANTERIOR CERVICAL DECOMP/DISCECTOMY FUSION N/A 08/27/2014   Procedure: Cervical six-seven anterior cervical decompression fusion with removal of hardware at Cervical five-six;  Surgeon: Erline Levine, MD;  Location: [facility];  Service: Neurosurgery;  Laterality: N/A;  Cervical six-seven anterior cervical decompression fusion with removal of hardware at Cervical five-six   BACK SURGERY     1992, 2013 lower back   CARDIAC CATHETERIZATION  05/10/2007   Minimal CAD, normal LV systolic function, medical management   CARDIAC CATHETERIZATION   04/08/2008   RCA ulcerated 70-80% stenosis, stented with a 3x6m Endeavor stent at 13atm for 50sec, reduced from 80% ulcerated stenosis to 0%.   CARDIAC CATHETERIZATION  05/07/2009   50% distal left main disease-IVUS or flow wire too dangerous in particular setting to perform intervention.   CARDIAC CATHETERIZATION  03/18/2010   Medical management   CARDIOVASCULAR STRESS TEST  02/09/2012   Normal, no significant wall abnormalities noted   CARPAL TUNNEL RELEASE Bilateral 2000   COLONOSCOPY W/ BIOPSIES AND POLYPECTOMY     CORONARY STENT PLACEMENT     FEMUR FRACTURE SURGERY Left 1995   HERNIA REPAIR     as infant groin   NECK SURGERY  2005   NEPHRECTOMY Left 1997   secondary to cancer   TOTAL KNEE ARTHROPLASTY Left 2005   TOTAL  KNEE ARTHROPLASTY Right 04/13/2018   Procedure: RIGHT TOTAL KNEE ARTHROPLASTY;  Surgeon: Netta Cedars, MD;  Location: [facility];  Service: Orthopedics;  Laterality: Right;   TRANSURETHRAL RESECTION OF PROSTATE N/A 03/25/2021   Procedure: TRANSURETHRAL RESECTION OF THE PROSTATE (TURP);  Surgeon: Franchot Gallo, MD;  Location: [facility];  Service: Urology;  Laterality: N/A;    Review of systems negative except as noted in HPI / PMHx or noted below:  Review of Systems  Constitutional: Negative.   HENT: Negative.    Eyes: Negative.   Respiratory: Negative.    Cardiovascular: Negative.   Gastrointestinal: Negative.   Genitourinary: Negative.   Musculoskeletal: Negative.   Skin: Negative.   Neurological: Negative.   Endo/Heme/Allergies: Negative.   Psychiatric/Behavioral: Negative.      Objective:   Vitals:   05/25/21 1147  BP: 120/64  Pulse: 90  Resp: 16  Temp: (!) 97.3 F (36.3 C)  SpO2: 97%   Height: '5\' 8"'$  (172.7 cm)  Weight: 207 lb (93.9 kg)   Physical Exam Constitutional:      Appearance: He is not diaphoretic.  HENT:     Head: Normocephalic.     Right Ear: Tympanic membrane, ear canal and external ear normal.     Left  Ear: Tympanic membrane, ear canal and external ear normal.     Nose: Nose normal. No mucosal edema or rhinorrhea.     Mouth/Throat:     Pharynx: Uvula midline. No oropharyngeal exudate.  Eyes:     Conjunctiva/sclera: Conjunctivae normal.  Neck:     Thyroid: No thyromegaly.     Trachea: Trachea normal. No tracheal tenderness or tracheal deviation.  Cardiovascular:     Rate and Rhythm: Normal rate and regular rhythm.     Heart sounds: Normal heart sounds, S1 normal and S2 normal. No murmur heard. Pulmonary:     Effort: No respiratory distress.     Breath sounds: Normal breath sounds. No stridor. No wheezing or rales.  Lymphadenopathy:     Head:     Right side of head: No tonsillar adenopathy.     Left side of head: No tonsillar adenopathy.     Cervical: No cervical adenopathy.  Skin:    Findings: No erythema or rash.     Nails: There is no clubbing.  Neurological:     Mental Status: He is alert.    Diagnostics:    Spirometry was performed and demonstrated an FEV1 of 2.39 at 81 % of predicted.  Assessment and Plan:   1. Severe persistent asthma without complication   2. Seasonal and perennial allergic rhinitis   3. LPRD (laryngopharyngeal reflux disease)     1.  Continue omalizumab injections  2.  Continue Flovent 110 - 2 inhalations twice a day with spacer  3.  Continue Nexium 40 mg - 1 tablet 1 time per day  4.  Continue albuterol HFA or nebulization if needed  5.  Obtain fall flu vaccine  6.  Return to clinic in 6 months or earlier if problem  I suspect that Ladarrion will do quite well on his current plan which includes omalizumab as a controller agent.  He will remain on inhaled steroid and a proton pump inhibitor to address his inflammatory and reflux induced respiratory disease and assuming he does well with this plan we will see him back in his clinic in 6 months or earlier if there is a problem.  Allena Katz, MD Allergy / Immunology [facility]

## 2021-05-25 NOTE — Telephone Encounter (Signed)
Sent in rx for spacer and left message for pt about sending in rx for spacer and that if it was not affordable he could purchase one from [facility]

## 2021-05-25 NOTE — Patient Instructions (Signed)
  1.  Continue omalizumab injections  2.  Continue Flovent 110 - 2 inhalations twice a day with spacer  3.  Continue Nexium 40 mg - 1 tablet 1 time per day  4.  Continue albuterol HFA or nebulization if needed  5.  Obtain fall flu vaccine  6.  Return to clinic in 6 months or earlier if problem

## 2021-05-26 ENCOUNTER — Other Ambulatory Visit: Payer: Self-pay

## 2021-05-26 ENCOUNTER — Encounter: Payer: Self-pay | Admitting: Gastroenterology

## 2021-05-26 ENCOUNTER — Encounter: Payer: Self-pay | Admitting: Allergy and Immunology

## 2021-05-27 ENCOUNTER — Encounter: Payer: Self-pay | Admitting: *Deleted

## 2021-05-27 NOTE — Telephone Encounter (Signed)
Called the patient's pharmacy and they have the spacer its just not covered by his insurance. The spacer is $75 and the pharmacist was not willing to see if any other brand of spacers is covered or cost efficient. I left a message for the patient to call our office back.

## 2021-05-27 NOTE — Telephone Encounter (Signed)
Patient spoke to pharmacy yesterday, and was told they did not receive the prescription for the spacer. Patient is requesting prescription be sent in again.   [place] contact number for patient: [phone]

## 2021-05-28 NOTE — Addendum Note (Signed)
Addended by: Felipa Emory on: 05/28/2021 12:24 PM   Modules accepted: Orders

## 2021-05-30 DIAGNOSIS — M25552 Pain in left hip: Secondary | ICD-10-CM | POA: Insufficient documentation

## 2021-06-01 ENCOUNTER — Ambulatory Visit (AMBULATORY_SURGERY_CENTER): Payer: Medicare Other | Admitting: Gastroenterology

## 2021-06-01 ENCOUNTER — Emergency Department (HOSPITAL_COMMUNITY): Payer: Medicare Other

## 2021-06-01 ENCOUNTER — Other Ambulatory Visit: Payer: Self-pay

## 2021-06-01 ENCOUNTER — Encounter: Payer: Self-pay | Admitting: Gastroenterology

## 2021-06-01 ENCOUNTER — Encounter (HOSPITAL_COMMUNITY): Payer: Self-pay

## 2021-06-01 ENCOUNTER — Inpatient Hospital Stay (HOSPITAL_COMMUNITY)
Admission: EM | Admit: 2021-06-01 | Discharge: 2021-06-05 | DRG: 178 | Disposition: A | Discharge status: Home / Self Care | Payer: Medicare Other | Attending: Internal Medicine | Admitting: Internal Medicine

## 2021-06-01 VITALS — BP 113/59 | HR 86 | Temp 97.8°F | Resp 24 | Ht 66.0 in | Wt 204.0 lb

## 2021-06-01 DIAGNOSIS — I2510 Atherosclerotic heart disease of native coronary artery without angina pectoris: Secondary | ICD-10-CM | POA: Diagnosis present

## 2021-06-01 DIAGNOSIS — M25561 Pain in right knee: Secondary | ICD-10-CM | POA: Diagnosis present

## 2021-06-01 DIAGNOSIS — U071 COVID-19: Principal | ICD-10-CM | POA: Diagnosis present

## 2021-06-01 DIAGNOSIS — E876 Hypokalemia: Secondary | ICD-10-CM | POA: Diagnosis present

## 2021-06-01 DIAGNOSIS — Z8349 Family history of other endocrine, nutritional and metabolic diseases: Secondary | ICD-10-CM

## 2021-06-01 DIAGNOSIS — I129 Hypertensive chronic kidney disease with stage 1 through stage 4 chronic kidney disease, or unspecified chronic kidney disease: Secondary | ICD-10-CM | POA: Diagnosis present

## 2021-06-01 DIAGNOSIS — K5730 Diverticulosis of large intestine without perforation or abscess without bleeding: Secondary | ICD-10-CM

## 2021-06-01 DIAGNOSIS — G20C Parkinsonism, unspecified: Secondary | ICD-10-CM

## 2021-06-01 DIAGNOSIS — R531 Weakness: Secondary | ICD-10-CM

## 2021-06-01 DIAGNOSIS — Z905 Acquired absence of kidney: Secondary | ICD-10-CM

## 2021-06-01 DIAGNOSIS — Z86010 Personal history of colonic polyps: Secondary | ICD-10-CM

## 2021-06-01 DIAGNOSIS — G2111 Neuroleptic induced parkinsonism: Secondary | ICD-10-CM | POA: Diagnosis present

## 2021-06-01 DIAGNOSIS — I252 Old myocardial infarction: Secondary | ICD-10-CM

## 2021-06-01 DIAGNOSIS — G4733 Obstructive sleep apnea (adult) (pediatric): Secondary | ICD-10-CM | POA: Diagnosis present

## 2021-06-01 DIAGNOSIS — Z981 Arthrodesis status: Secondary | ICD-10-CM

## 2021-06-01 DIAGNOSIS — E669 Obesity, unspecified: Secondary | ICD-10-CM | POA: Diagnosis present

## 2021-06-01 DIAGNOSIS — E782 Mixed hyperlipidemia: Secondary | ICD-10-CM | POA: Diagnosis present

## 2021-06-01 DIAGNOSIS — M25569 Pain in unspecified knee: Secondary | ICD-10-CM

## 2021-06-01 DIAGNOSIS — Z885 Allergy status to narcotic agent status: Secondary | ICD-10-CM

## 2021-06-01 DIAGNOSIS — I10 Essential (primary) hypertension: Secondary | ICD-10-CM | POA: Diagnosis present

## 2021-06-01 DIAGNOSIS — Z79899 Other long term (current) drug therapy: Secondary | ICD-10-CM

## 2021-06-01 DIAGNOSIS — E039 Hypothyroidism, unspecified: Secondary | ICD-10-CM | POA: Diagnosis present

## 2021-06-01 DIAGNOSIS — N401 Enlarged prostate with lower urinary tract symptoms: Secondary | ICD-10-CM | POA: Diagnosis present

## 2021-06-01 DIAGNOSIS — Z9079 Acquired absence of other genital organ(s): Secondary | ICD-10-CM

## 2021-06-01 DIAGNOSIS — Z8249 Family history of ischemic heart disease and other diseases of the circulatory system: Secondary | ICD-10-CM

## 2021-06-01 DIAGNOSIS — Z6832 Body mass index (BMI) 32.0-32.9, adult: Secondary | ICD-10-CM

## 2021-06-01 DIAGNOSIS — F32A Depression, unspecified: Secondary | ICD-10-CM | POA: Diagnosis present

## 2021-06-01 DIAGNOSIS — Z825 Family history of asthma and other chronic lower respiratory diseases: Secondary | ICD-10-CM

## 2021-06-01 DIAGNOSIS — N1831 Chronic kidney disease, stage 3a: Secondary | ICD-10-CM | POA: Diagnosis present

## 2021-06-01 DIAGNOSIS — N138 Other obstructive and reflux uropathy: Secondary | ICD-10-CM | POA: Diagnosis present

## 2021-06-01 DIAGNOSIS — D123 Benign neoplasm of transverse colon: Secondary | ICD-10-CM

## 2021-06-01 DIAGNOSIS — Z888 Allergy status to other drugs, medicaments and biological substances status: Secondary | ICD-10-CM

## 2021-06-01 DIAGNOSIS — M25469 Effusion, unspecified knee: Secondary | ICD-10-CM

## 2021-06-01 DIAGNOSIS — K5790 Diverticulosis of intestine, part unspecified, without perforation or abscess without bleeding: Secondary | ICD-10-CM | POA: Diagnosis present

## 2021-06-01 DIAGNOSIS — Z955 Presence of coronary angioplasty implant and graft: Secondary | ICD-10-CM

## 2021-06-01 DIAGNOSIS — Z79890 Hormone replacement therapy (postmenopausal): Secondary | ICD-10-CM

## 2021-06-01 DIAGNOSIS — Z8616 Personal history of COVID-19: Secondary | ICD-10-CM

## 2021-06-01 DIAGNOSIS — Z818 Family history of other mental and behavioral disorders: Secondary | ICD-10-CM

## 2021-06-01 DIAGNOSIS — G20 Parkinson's disease: Secondary | ICD-10-CM

## 2021-06-01 DIAGNOSIS — K219 Gastro-esophageal reflux disease without esophagitis: Secondary | ICD-10-CM | POA: Diagnosis present

## 2021-06-01 DIAGNOSIS — Z882 Allergy status to sulfonamides status: Secondary | ICD-10-CM

## 2021-06-01 DIAGNOSIS — Z85528 Personal history of other malignant neoplasm of kidney: Secondary | ICD-10-CM

## 2021-06-01 DIAGNOSIS — J4550 Severe persistent asthma, uncomplicated: Secondary | ICD-10-CM | POA: Diagnosis present

## 2021-06-01 DIAGNOSIS — T43505A Adverse effect of unspecified antipsychotics and neuroleptics, initial encounter: Secondary | ICD-10-CM | POA: Diagnosis present

## 2021-06-01 DIAGNOSIS — Z91041 Radiographic dye allergy status: Secondary | ICD-10-CM

## 2021-06-01 DIAGNOSIS — D120 Benign neoplasm of cecum: Secondary | ICD-10-CM

## 2021-06-01 DIAGNOSIS — F419 Anxiety disorder, unspecified: Secondary | ICD-10-CM | POA: Diagnosis present

## 2021-06-01 DIAGNOSIS — J479 Bronchiectasis, uncomplicated: Secondary | ICD-10-CM | POA: Diagnosis present

## 2021-06-01 HISTORY — PX: COLONOSCOPY WITH PROPOFOL: SHX5780

## 2021-06-01 LAB — CBC WITH DIFFERENTIAL/PLATELET
Abs Immature Granulocytes: 0.07 10*3/uL (ref 0.00–0.07)
Basophils Absolute: 0.1 10*3/uL (ref 0.0–0.1)
Basophils Relative: 1 %
Eosinophils Absolute: 0.1 10*3/uL (ref 0.0–0.5)
Eosinophils Relative: 1 %
HCT: 38.1 % — ABNORMAL LOW (ref 39.0–52.0)
Hemoglobin: 12.3 g/dL — ABNORMAL LOW (ref 13.0–17.0)
Immature Granulocytes: 1 %
Lymphocytes Relative: 12 %
Lymphs Abs: 1.2 10*3/uL (ref 0.7–4.0)
MCH: 27.5 pg (ref 26.0–34.0)
MCHC: 32.3 g/dL (ref 30.0–36.0)
MCV: 85.2 fL (ref 80.0–100.0)
Monocytes Absolute: 1 10*3/uL (ref 0.1–1.0)
Monocytes Relative: 9 %
Neutro Abs: 7.9 10*3/uL — ABNORMAL HIGH (ref 1.7–7.7)
Neutrophils Relative %: 76 %
Platelets: 285 10*3/uL (ref 150–400)
RBC: 4.47 MIL/uL (ref 4.22–5.81)
RDW: 13.9 % (ref 11.5–15.5)
WBC: 10.3 10*3/uL (ref 4.0–10.5)
nRBC: 0 % (ref 0.0–0.2)

## 2021-06-01 LAB — URINALYSIS, ROUTINE W REFLEX MICROSCOPIC
Bilirubin Urine: NEGATIVE
Glucose, UA: NEGATIVE mg/dL
Ketones, ur: NEGATIVE mg/dL
Nitrite: NEGATIVE
Protein, ur: NEGATIVE mg/dL
Specific Gravity, Urine: 1.01 (ref 1.005–1.030)
pH: 6 (ref 5.0–8.0)

## 2021-06-01 LAB — TROPONIN I (HIGH SENSITIVITY)
Troponin I (High Sensitivity): 15 ng/L (ref <18)
Troponin I (High Sensitivity): 15 ng/L (ref <18)

## 2021-06-01 LAB — COMPREHENSIVE METABOLIC PANEL
ALT: 22 U/L (ref 0–44)
AST: 19 U/L (ref 15–41)
Albumin: 3.6 g/dL (ref 3.5–5.0)
Alkaline Phosphatase: 68 U/L (ref 38–126)
Anion gap: 11 (ref 5–15)
BUN: 9 mg/dL (ref 8–23)
CO2: 24 mmol/L (ref 22–32)
Calcium: 9 mg/dL (ref 8.9–10.3)
Chloride: 109 mmol/L (ref 98–111)
Creatinine, Ser: 1.32 mg/dL — ABNORMAL HIGH (ref 0.61–1.24)
GFR, Estimated: 57 mL/min — ABNORMAL LOW (ref >60)
Glucose, Bld: 108 mg/dL — ABNORMAL HIGH (ref 70–99)
Potassium: 3.4 mmol/L — ABNORMAL LOW (ref 3.5–5.1)
Sodium: 144 mmol/L (ref 135–145)
Total Bilirubin: 0.7 mg/dL (ref 0.3–1.2)
Total Protein: 7.9 g/dL (ref 6.5–8.1)

## 2021-06-01 LAB — CK: Total CK: 91 U/L (ref 49–397)

## 2021-06-01 LAB — BRAIN NATRIURETIC PEPTIDE: B Natriuretic Peptide: 104.8 pg/mL — ABNORMAL HIGH (ref 0.0–100.0)

## 2021-06-01 MED ORDER — SODIUM CHLORIDE 0.9 % IV SOLN
500.0000 mL | Freq: Once | INTRAVENOUS | Status: DC
Start: 2021-06-01 — End: 2021-06-01

## 2021-06-01 MED ORDER — LACTATED RINGERS IV BOLUS
1000.0000 mL | Freq: Once | INTRAVENOUS | Status: AC
  Administered 2021-06-01: 1000 mL via INTRAVENOUS

## 2021-06-01 NOTE — Progress Notes (Signed)
A and O x3. Report to RN. Tolerated MAC anesthesia well. 

## 2021-06-01 NOTE — Patient Instructions (Signed)
Handout on polyps and diverticulosis given    YOU HAD AN ENDOSCOPIC PROCEDURE TODAY AT THE [facility]:   Refer to the procedure report that was given to you for any specific questions about what was found during the examination.  If the procedure report does not answer your questions, please call your gastroenterologist to clarify.  If you requested that your care partner not be given the details of your procedure findings, then the procedure report has been included in a sealed envelope for you to review at your convenience later.  YOU SHOULD EXPECT: Some feelings of bloating in the abdomen. Passage of more gas than usual.  Walking can help get rid of the air that was put into your GI tract during the procedure and reduce the bloating. If you had a lower endoscopy (such as a colonoscopy or flexible sigmoidoscopy) you may notice spotting of blood in your stool or on the toilet paper. If you underwent a bowel prep for your procedure, you may not have a normal bowel movement for a few days.  Please Note:  You might notice some irritation and congestion in your nose or some drainage.  This is from the oxygen used during your procedure.  There is no need for concern and it should clear up in a day or so.  SYMPTOMS TO REPORT IMMEDIATELY:   Following lower endoscopy (colonoscopy or flexible sigmoidoscopy):  Excessive amounts of blood in the stool  Significant tenderness or worsening of abdominal pains  Swelling of the abdomen that is new, acute  Fever of 100F or higher   For urgent or emergent issues, a gastroenterologist can be reached at any hour by calling [phone]. Do not use MyChart messaging for urgent concerns.    DIET:  We do recommend a small meal at first, but then you may proceed to your regular diet.  Drink plenty of fluids but you should avoid alcoholic beverages for 24 hours.  ACTIVITY:  You should plan to take it easy for the rest of today and you should NOT  DRIVE or use heavy machinery until tomorrow (because of the sedation medicines used during the test).    FOLLOW UP: Our staff will call the number listed on your records 48-72 hours following your procedure to check on you and address any questions or concerns that you may have regarding the information given to you following your procedure. If we do not reach you, we will leave a message.  We will attempt to reach you two times.  During this call, we will ask if you have developed any symptoms of COVID 19. If you develop any symptoms (ie: fever, flu-like symptoms, shortness of breath, cough etc.) before then, please call [phone].  If you test positive for Covid 19 in the 2 weeks post procedure, please call and report this information to us.    If any biopsies were taken you will be contacted by phone or by letter within the next 1-3 weeks.  Please call us at [phone] if you have not heard about the biopsies in 3 weeks.    SIGNATURES/CONFIDENTIALITY: You and/or your care partner have signed paperwork which will be entered into your electronic medical record.  These signatures attest to the fact that that the information above on your After Visit Summary has been reviewed and is understood.  Full responsibility of the confidentiality of this discharge information lies with you and/or your care-partner. 

## 2021-06-01 NOTE — Progress Notes (Signed)
Pt's states no medical or surgical changes since previsit or office visit.  ° °Vitals CW °

## 2021-06-01 NOTE — ED Provider Notes (Signed)
[facility] DEPT Provider Note   CSN: [id number] Arrival date & time: 06/01/21  1937     History Chief Complaint  Patient presents with   Fall   Weakness    Jared [name] is a 70 y.o. male who presents with concern for weakness and fall out of bed with inability to get up off the floor at this afternoon.  Had prostate condition weeks ago and states since that time he has been very weak but this afternoon had a colonoscopy and was discharged home without complication.  States that he has been worse as far as his weakness, since this procedure.  He denies any head trauma, nausea, vomiting or blurry or double vision since that time.  Denies any chest pain or palpitations does endorse mild shortness of breath.  Denies any abdominal pain, nausea, vomiting, or diarrhea.  Does endorse increased flatus since colonoscopy today.   I personally read this patient's medical records.  His history of dyslipidemia, CAD, bronchiectasis, diverticulosis, BPH, hypertension, OSA, neuroleptic induced parkinsonism..  Patient is not anticoagulated.  Patient currently undergoing work-up with [facility] neurology for Parkinson-like illness.   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[place])    left arm tremor   Osteoarthritis    lower back   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   Renal cell carcinoma 1997   Left kidney   Sleep apnea    wears  CPAP   Sleep talking    Snoring 12/25/2013   Wears glasses     Patient Active Problem List   Diagnosis Date Noted   Generalized weakness 06/02/2021   Hypokalemia, inadequate intake 06/02/2021   Hypothyroidism 06/02/2021   Chronic kidney disease, stage 3a ([place]) 06/02/2021   BPH with obstruction/lower urinary tract symptoms 03/25/2021   Pre-diabetes 12/28/2020   Vitamin D deficiency 12/09/2020   Class 1 obesity with serious comorbidity and body mass index (BMI) of 33.0 to 33.9 in adult 12/09/2020   Viral URI 07/13/2020   AKI (acute kidney injury) ([place]) 11/17/2019   COVID-19 virus infection 11/16/2019   OSA on CPAP 08/28/2019   Pain due to onychomycosis of toenails of both feet 08/27/2019   Parkinsonism ([place]) 07/16/2019   Severe persistent asthma without complication [id number]   Seasonal and perennial allergic rhinitis 01/30/2019   Trigger finger of right thumb 10/30/2018   Trigger index finger of right hand 10/30/2018   Status post total knee replacement, 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   Stress fracture of femur 03/15/2018   Severe persistent asthma with acute exacerbation 12/21/2017   Other allergic rhinitis 12/21/2017   LPRD (laryngopharyngeal reflux disease) 12/21/2017   Tear of medial meniscus of knee 12/12/2017   Osteoarthritis of knee 10/03/2017   Allergic rhinoconjunctivitis 05/21/2015   C7 cervical fracture ([place]) 08/27/2014   Cervical spine fracture ([place]) 08/23/2014   Abnormal brain MRI 07/23/2014  Parkinsonian features 06/25/2014   Cervical vertebral fusion 05/02/2014   Snoring 12/25/2013   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   Insomnia due to mental condition 12/25/2013   Sleep talking    Essential hypertension 07/08/2013   Depression, prolonged 06/26/2013   Night sweats 06/26/2013   Chest pain at rest 06/25/2013   Bronchiectasis without acute exacerbation ([place]) 01/16/2008   PULMONARY NODULE 12/20/2007   HYPERNEPHROMA 11/27/2007   Mixed hyperlipidemia  11/27/2007   Coronary atherosclerosis 11/27/2007   HEMORRHOIDS, INTERNAL 11/27/2007   Moderate persistent asthma 11/27/2007   ESOPHAGITIS 11/27/2007   ESOPHAGEAL STRICTURE 11/27/2007   GERD without esophagitis 11/27/2007   GASTRITIS, ACUTE 11/27/2007   DIVERTICULOSIS, COLON 11/27/2007   ABDOMINAL PAIN, LEFT UPPER QUADRANT 11/27/2007   HIP FRACTURE, LEFT 11/27/2007   FRACTURE, TIBIA 11/27/2007   BENIGN PROSTATIC HYPERTROPHY, HX OF 11/27/2007    Past Surgical History:  Procedure Laterality Date   ANTERIOR CERVICAL DECOMP/DISCECTOMY FUSION N/A 08/27/2014   Procedure: Cervical six-seven anterior cervical decompression fusion with removal of hardware at Cervical five-six;  Surgeon: Erline Levine, MD;  Location: [facility];  Service: Neurosurgery;  Laterality: N/A;  Cervical six-seven anterior cervical decompression fusion with removal of hardware at Cervical five-six   BACK SURGERY     1992, 2013 lower back   CARDIAC CATHETERIZATION  05/10/2007   Minimal CAD, normal LV systolic function, medical management   CARDIAC CATHETERIZATION  04/08/2008   RCA ulcerated 70-80% stenosis, stented with a 3x80m Endeavor stent at 13atm for 50sec, reduced from 80% ulcerated stenosis to 0%.   CARDIAC CATHETERIZATION  05/07/2009   50% distal left main disease-IVUS or flow wire too dangerous in particular setting to perform intervention.   CARDIAC CATHETERIZATION  03/18/2010   Medical management   CARDIOVASCULAR STRESS TEST  02/09/2012   Normal, no significant wall abnormalities noted   CARPAL TUNNEL RELEASE Bilateral 2000   COLONOSCOPY     COLONOSCOPY W/ BIOPSIES AND POLYPECTOMY     [place] [facility] [place]  [place]   NECK SURGERY  2005   NEPHRECTOMY Left 1997   secondary to cancer   TOTAL KNEE ARTHROPLASTY Left 2005   TOTAL KNEE ARTHROPLASTY Right 04/13/2018    Procedure: RIGHT TOTAL KNEE ARTHROPLASTY;  Surgeon: NNetta Cedars MD;  [facility]  Service: Orthopedics;  Laterality: Right;   TRANSURETHRAL RESECTION OF PROSTATE N/A 03/25/2021   Procedure: TRANSURETHRAL RESECTION OF THE PROSTATE (TURP);  Surgeon: DFranchot Gallo MD;  [facility]  Service: [facility]rology;  Laterality: N/A;       Family History  Problem Relation Age of Onset   Asthma Mother    Heart disease Mother    Hypertension Mother    Thyroid disease Mother    Esophageal cancer Father    Barrett's esophagus Father    Bone cancer Father        mets from esophagus   Heart disease Father    Cancer Father    Depression Father    Anxiety disorder Father    Alcoholism Father    Obesity Father    Diabetes Sister    Cancer Sister        Cervical cancer   Colon cancer Maternal Uncle    Heart disease Paternal Grandfather    Heart attack Paternal Grandfather [phone]  Stomach  cancer Neg Hx    Colon polyps Neg Hx    Rectal cancer Neg Hx     Social History   Tobacco Use   Smoking status: Never   Smokeless tobacco: Never  Vaping Use   Vaping Use: Never used  Substance Use Topics   Alcohol use: Not Currently   Drug use: No    Home Medications Prior to Admission medications   Medication Sig Start Date End Date Taking? Authorizing Provider  albuterol (VENTOLIN HFA) 108 (90 Base) MCG/ACT inhaler Can inhale two puffs every four to six hours as needed for cough, wheeze, shortness of breath, or chest tightness. 05/25/21  Yes Kozlow, Donnamarie Poag, MD  Alirocumab (PRALUENT) 150 MG/ML SOAJ Inject 150 mg into the skin every 14 (fourteen) days. 08/11/20  Yes Lorretta Harp, MD  amLODipine (NORVASC) 10 MG tablet Take 10 mg by mouth daily.   Yes [provider]  ARIPiprazole (ABILIFY) 5 MG tablet Take 5 mg by mouth every morning. 05/21/20  Yes [provider]  aspirin 325 MG EC tablet Take 325 mg by mouth daily.   Yes [provider]   betamethasone dipropionate 0.05 % cream Apply 1 application topically daily as needed (dry skin). 05/26/21  Yes [provider]  DULoxetine (CYMBALTA) 60 MG capsule Take 60 mg by mouth every morning. 04/22/21  Yes [provider]  esomeprazole (NEXIUM) 40 MG capsule Take 1 capsule (40 mg total) by mouth daily at 12 noon. 05/25/21  Yes Kozlow, Donnamarie Poag, MD  ezetimibe (ZETIA) 10 MG tablet Take 10 mg by mouth daily.   Yes [provider]  finasteride (PROSCAR) 5 MG tablet Take 5 mg by mouth daily.   Yes [provider]  fluticasone (FLOVENT HFA) 110 MCG/ACT inhaler 2 puffs twice a day with a spacer to prevent cough or wheeze. Rinse mouth after use. 05/25/21  Yes Kozlow, Donnamarie Poag, MD  furosemide (LASIX) 20 MG tablet Take 20 mg by mouth daily as needed for edema.   Yes [provider]  gemfibrozil (LOPID) 600 MG tablet Take 600 mg by mouth 2 (two) times daily before a meal.   Yes [provider]  ipratropium-albuterol (DUONEB) 0.5-2.5 (3) MG/3ML SOLN Take 3 mLs by nebulization every 6 (six) hours as needed (wheezing, coughing, shortness of breath). 05/25/21  Yes Kozlow, Donnamarie Poag, MD  isosorbide mononitrate (IMDUR) 30 MG 24 hr tablet take 1 tablet by mouth once daily Patient taking differently: at bedtime. 06/21/17  Yes Lorretta Harp, MD  levothyroxine (SYNTHROID) 25 MCG tablet Take 25 mcg by mouth every morning. 03/30/21  Yes [provider]  mometasone-formoterol (DULERA) 200-5 MCG/ACT AERO Inhale 2 puffs into the lungs 2 (two) times daily.   Yes [provider]  montelukast (SINGULAIR) 10 MG tablet TAKE 1 TABLET(10 MG) BY MOUTH AT BEDTIME Patient taking differently: Take 10 mg by mouth at bedtime. 05/25/21  Yes Kozlow, Donnamarie Poag, MD  Multiple Vitamin (MULTIVITAMIN) tablet Take 1 tablet by mouth at bedtime.    Yes [provider]  Omega-3 Fatty Acids (FISH OIL) 500 MG CAPS Take 500 mg by mouth daily.   Yes [provider]   potassium chloride (KLOR-CON) 10 MEQ tablet Take 10 mEq by mouth daily as needed (low levels).   Yes [provider]  silodosin (RAPAFLO) 8 MG CAPS capsule Take 8 mg by mouth at bedtime.    Yes [provider]  Spacer/Aero-Holding Chambers (AEROCHAMBER PLUS) inhaler Use as instructed 05/25/21  Yes Kozlow, Donnamarie Poag, MD  Spacer/Aero-Holding Chambers [facility] DIAMOND) MISC Use as directed with inhaler. 07/20/20  Yes Garnet Sierras, DO  telmisartan (MICARDIS) 20 MG tablet Take 20 mg by mouth daily. 02/20/20  Yes [provider]  Tiotropium Bromide Monohydrate (SPIRIVA RESPIMAT) 1.25 MCG/ACT AERS Inhale 2 puffs into the lungs daily.   Yes [provider]  traZODone (DESYREL) 50 MG tablet Take 0.5-1 tablets (25-50 mg total) by mouth at bedtime as needed for sleep. 04/21/21  Yes Dohmeier, [name], MD  vitamin C (ASCORBIC ACID) 500 MG tablet Take 500 mg by mouth 2 (two) times daily.    Yes [provider]  VITAMIN D PO Take 50,000 Units by mouth once a week.   Yes [provider]  XOLAIR 150 MG injection INJECT '150MG'$  SUBCUTANEOUSLY EVERY 4 WEEKS (GIVEN AT MD  OFFICE) Patient taking differently: Inject 150 mg into the skin every 28 (twenty-eight) days. 07/10/20  Yes Kozlow, Donnamarie Poag, MD  Vibegron (GEMTESA) 75 MG TABS Take 1 Dose by mouth daily.    [provider]    Allergies    Contrast media [iodinated diagnostic agents], Nsaids, Penicillins, Sulfonamide derivatives, Codeine, and Statins  Review of Systems   Review of Systems  Constitutional: Negative.   HENT: Negative.    Eyes: Negative.   Respiratory: Negative.    Cardiovascular: Negative.   Gastrointestinal: Negative.   Genitourinary: Negative.   Musculoskeletal: Negative.   Neurological:  Positive for tremors and weakness. Negative for seizures.   Physical Exam Updated Vital Signs BP 109/66 (BP Location: Right Arm)   Pulse 94   Temp 98.4 F (36.9 C) (Oral)   Resp (!) 24   Ht '5\' 8"'$   (1.727 m)   Wt 93.3 kg   SpO2 93%   BMI 31.27 kg/m   Physical Exam Vitals and nursing note reviewed.  Constitutional:      Appearance: He is obese. He is not toxic-appearing.  HENT:     Head: Normocephalic and atraumatic.     Nose: Nose normal. No congestion.     Mouth/Throat:     Mouth: Mucous membranes are moist.     Pharynx: Oropharynx is clear. Uvula midline. No oropharyngeal exudate or posterior oropharyngeal erythema.     Tonsils: No tonsillar exudate.  Eyes:     General: Lids are normal. Vision grossly intact.        Right eye: No discharge.        Left eye: No discharge.     Extraocular Movements: Extraocular movements intact.     Conjunctiva/sclera: Conjunctivae normal.     Pupils: Pupils are equal, round, and reactive to light.  Neck:     Trachea: Trachea and phonation normal.  Cardiovascular:     Rate and Rhythm: Normal rate and regular rhythm.     Pulses: Normal pulses.     Heart sounds: Normal heart sounds. No murmur heard. Pulmonary:     Effort: Pulmonary effort is normal. No tachypnea, bradypnea, accessory muscle usage, prolonged expiration or respiratory distress.     Breath sounds: Normal breath sounds. No wheezing or rales.  Chest:     Chest wall: No mass, lacerations, deformity, swelling, tenderness or crepitus.  Abdominal:     General: Bowel sounds are normal. There is no distension.     Palpations: Abdomen is soft.     Tenderness: There is no abdominal tenderness. There is no right CVA tenderness, left CVA tenderness, guarding or rebound.  Musculoskeletal:  General: No deformity.     Cervical back: Normal range of motion and neck supple. No edema, rigidity or crepitus. No pain with movement, spinous process tenderness or muscular tenderness.     Right lower leg: No edema.     Left lower leg: No edema.  Lymphadenopathy:     Cervical: No cervical adenopathy.  Skin:    General: Skin is warm and dry.     Capillary Refill: Capillary refill takes  less than 2 seconds.  Neurological:     Mental Status: He is alert and oriented to person, place, and time. Mental status is at baseline.     GCS: GCS eye subscore is 4. GCS verbal subscore is 5. GCS motor subscore is 6.     Cranial Nerves: Cranial nerves are intact.     Sensory: Sensation is intact.     Motor: Weakness and tremor present.     Coordination: Finger-Nose-Finger Test normal. Rapid alternating movements normal.     Gait: Gait is intact.     Deep Tendon Reflexes:     Reflex Scores:      Bicep reflexes are 2+ on the right side.      Patellar reflexes are 1+ on the right side and 2+ on the left side.    Comments: LUE tremor, worsens with intention RAM, slower in the left than RUE, however able to complete without significant difficulty.  Symmetric strength in the lower extremities with knee flexion extension and plantar dorsiflexion, however 4/5. Right upper extremity grip strength 4/5, elbow flexion extension 4/5.  Left upper extremity strength difficult to assess due to tremor that worsens with intention. Cranial nerves are intact, EOMI, PERRL, visual fields are intact.  Psychiatric:        Mood and Affect: Mood normal.    ED Results / Procedures / Treatments   Labs (all labs ordered are listed, but only abnormal results are displayed) Labs Reviewed  RESP PANEL BY RT-PCR (FLU A&B, COVID) ARPGX2 - Abnormal; Notable for the following components:      Result Value   SARS Coronavirus 2 by RT PCR POSITIVE (*)    All other components within normal limits  COMPREHENSIVE METABOLIC PANEL - Abnormal; Notable for the following components:   Potassium 3.4 (*)    Glucose, Bld 108 (*)    Creatinine, Ser 1.32 (*)    GFR, Estimated 57 (*)    All other components within normal limits  CBC WITH DIFFERENTIAL/PLATELET - Abnormal; Notable for the following components:   Hemoglobin 12.3 (*)    HCT 38.1 (*)    Neutro Abs 7.9 (*)    All other components within normal limits  BRAIN  NATRIURETIC PEPTIDE - Abnormal; Notable for the following components:   B Natriuretic Peptide 104.8 (*)    All other components within normal limits  URINALYSIS, ROUTINE W REFLEX MICROSCOPIC - Abnormal; Notable for the following components:   Hgb urine dipstick TRACE (*)    Leukocytes,Ua TRACE (*)    Bacteria, UA RARE (*)    All other components within normal limits  C-REACTIVE PROTEIN - Abnormal; Notable for the following components:   CRP 10.7 (*)    All other components within normal limits  COMPREHENSIVE METABOLIC PANEL - Abnormal; Notable for the following components:   Glucose, Bld 111 (*)    BUN 7 (*)    All other components within normal limits  CBC WITH DIFFERENTIAL/PLATELET - Abnormal; Notable for the following components:   Hemoglobin 12.2 (*)  HCT 38.7 (*)    Monocytes Absolute 1.1 (*)    Abs Immature Granulocytes 0.09 (*)    All other components within normal limits  D-DIMER, QUANTITATIVE - Abnormal; Notable for the following components:   D-Dimer, Quant 1.35 (*)    All other components within normal limits  C-REACTIVE PROTEIN - Abnormal; Notable for the following components:   CRP 17.1 (*)    All other components within normal limits  D-DIMER, QUANTITATIVE - Abnormal; Notable for the following components:   D-Dimer, Quant 1.30 (*)    All other components within normal limits  COMPREHENSIVE METABOLIC PANEL - Abnormal; Notable for the following components:   Potassium 3.3 (*)    Glucose, Bld 124 (*)    Creatinine, Ser 1.34 (*)    Calcium 8.5 (*)    Albumin 3.2 (*)    GFR, Estimated 56 (*)    All other components within normal limits  CBC WITH DIFFERENTIAL/PLATELET - Abnormal; Notable for the following components:   Hemoglobin 11.9 (*)    HCT 36.4 (*)    Monocytes Absolute 1.6 (*)    All other components within normal limits  GLUCOSE, CAPILLARY - Abnormal; Notable for the following components:   Glucose-Capillary 108 (*)    All other components within normal  limits  CK  VITAMIN B12  TSH  PROCALCITONIN  METHYLMALONIC ACID, SERUM  VITAMIN B1  C-REACTIVE PROTEIN  D-DIMER, QUANTITATIVE  COMPREHENSIVE METABOLIC PANEL  CBC WITH DIFFERENTIAL/PLATELET  TROPONIN I (HIGH SENSITIVITY)  TROPONIN I (HIGH SENSITIVITY)    EKG EKG Interpretation  Date/Time:  Tuesday June 01 2021 20:05:56 EDT Ventricular Rate:  102 PR Interval:  143 QRS Duration: 82 QT Interval:  401 QTC Calculation: 523 R Axis:   69 Text Interpretation: Sinus tachycardia Probable left atrial enlargement Abnormal R-wave progression, early transition Borderline repolarization abnormality Prolonged QT interval No acute changes No significant change since last tracing Confirmed by Varney Biles [phone]) on 06/01/2021 10:55:09 PM  Radiology CT HEAD WO CONTRAST (5MM)  Result Date: 06/01/2021 CLINICAL DATA:  Head trauma.  Weakness. EXAM: CT HEAD WITHOUT CONTRAST TECHNIQUE: Contiguous axial images were obtained from the base of the skull through the vertex without intravenous contrast. COMPARISON:  None. FINDINGS: Brain: There is no mass, hemorrhage or extra-axial collection. The appearance of the white matter is normal for the patient's age. There is generalized atrophy. Vascular: No abnormal hyperdensity of the major intracranial arteries or dural venous sinuses. No intracranial atherosclerosis. Skull: The visualized skull base, calvarium and extracranial soft tissues are normal. Sinuses/Orbits: No fluid levels or advanced mucosal thickening of the visualized paranasal sinuses. No mastoid or middle ear effusion. The orbits are normal. IMPRESSION: Generalized atrophy without acute intracranial abnormality. Electronically Signed   By: Ulyses Jarred M.D.   On: 06/01/2021 23:40   DG Chest Portable 1 View  Result Date: 06/01/2021 CLINICAL DATA:  Weakness after colonoscopy this morning, fell out of bed EXAM: PORTABLE CHEST 1 VIEW COMPARISON:  04/02/2020 FINDINGS: Single frontal view of the  chest demonstrates an enlarged cardiac silhouette, likely exaggerated by AP portable technique. No airspace disease, effusion, or pneumothorax. No acute bony abnormalities. IMPRESSION: 1. No acute intrathoracic process. Electronically Signed   By: Randa Ngo M.D.   On: 06/01/2021 21:50    Procedures Procedures   Medications Ordered in ED Medications  lactated ringers infusion (0 mLs Intravenous Stopped 06/02/21 2155)  amLODipine (NORVASC) tablet 10 mg (10 mg Oral Given 06/03/21 0920)  isosorbide mononitrate (IMDUR) 24 hr tablet  30 mg (30 mg Oral Given 06/02/21 2101)  irbesartan (AVAPRO) tablet 75 mg (75 mg Oral Given 06/03/21 0919)  ARIPiprazole (ABILIFY) tablet 5 mg (5 mg Oral Given 06/03/21 0919)  DULoxetine (CYMBALTA) DR capsule 60 mg (60 mg Oral Given 06/03/21 0934)  levothyroxine (SYNTHROID) tablet 25 mcg (25 mcg Oral Given 06/03/21 0457)  pantoprazole (PROTONIX) EC tablet 40 mg (40 mg Oral Given 06/03/21 0919)  tamsulosin (FLOMAX) capsule 0.4 mg (0.4 mg Oral Given 06/02/21 2101)  fluticasone (FLOVENT HFA) 110 MCG/ACT inhaler 2 puff (2 puffs Inhalation Given 06/03/21 0937)  montelukast (SINGULAIR) tablet 10 mg (10 mg Oral Given 06/02/21 2101)  enoxaparin (LOVENOX) injection 40 mg (40 mg Subcutaneous Given 06/03/21 0922)  acetaminophen (TYLENOL) tablet 650 mg (has no administration in time range)    Or  acetaminophen (TYLENOL) suppository 650 mg (has no administration in time range)  polyethylene glycol (MIRALAX / GLYCOLAX) packet 17 g (has no administration in time range)  ondansetron (ZOFRAN) tablet 4 mg (has no administration in time range)    Or  ondansetron (ZOFRAN) injection 4 mg (has no administration in time range)  albuterol (VENTOLIN HFA) 108 (90 Base) MCG/ACT inhaler 2 puff (2 puffs Inhalation Not Given 06/03/21 1410)  guaiFENesin-dextromethorphan (ROBITUSSIN DM) 100-10 MG/5ML syrup 10 mL (10 mLs Oral Given 06/02/21 2101)  ascorbic acid (VITAMIN C) tablet 500 mg (500 mg Oral Given  06/03/21 0920)  zinc sulfate capsule 220 mg (220 mg Oral Given 06/03/21 0920)  remdesivir 100 mg in sodium chloride 0.9 % 100 mL IVPB (0 mg Intravenous Stopped 06/02/21 0630)    Followed by  remdesivir 100 mg in sodium chloride 0.9 % 100 mL IVPB (0 mg Intravenous Stopped 06/02/21 0708)    Followed by  remdesivir 100 mg in sodium chloride 0.9 % 100 mL IVPB (0 mg Intravenous Stopped 06/03/21 1736)  traZODone (DESYREL) tablet 25-50 mg (50 mg Oral Given 06/02/21 2155)  menthol-cetylpyridinium (CEPACOL) lozenge 3 mg (3 mg Oral Given 06/03/21 0557)  lactated ringers bolus 1,000 mL (0 mLs Intravenous Stopped 06/02/21 0031)  potassium chloride SA (KLOR-CON) CR tablet 40 mEq (40 mEq Oral Given 06/03/21 1537)    ED Course  I have reviewed the triage vital signs and the nursing notes.  Pertinent labs & imaging results that were available during my care of the patient were reviewed by me and considered in my medical decision making (see chart for details).    MDM Rules/Calculators/A&P                         70 year old male who presents with concern for acute weakness starting today after his colonoscopy.  History of Parkinson-like illness.  The differential diagnosis of weakness includes but is not limited to: Neurologic causes: GBS, myasthenia gravis, CVA, MS, ALS, transverse myelitis, spinal cord injury, CVA, botulism Other causes: ACS, Arrhythmia, syncope, orthostatic hypotension, sepsis, hypoglycemia, electrolyte disturbance, hypothyroidism, respiratory failure, symptomatic anemia, dehydration, heat injury, polypharmacy, malignancy.  Patient very mildly tachycardic with heart rate of 101 at time of intake, vital signs otherwise normal.  Cardiopulmonary exam unremarkable, exam is benign.  Patient is at neurologic baseline without significant unilateral weakness of there is left-sided upper extremity tremor which is worsened with intention.  Additionally decreased right lower extremity reflexes relative to  the left.  CBC with hemoglobin near patient's baseline at 11.9.  CMP with mild hypokalemia of 3.3.  Creatinine of 1.34 additionally near patient's baseline.  Troponin negative, BNP very mildly elevated  at 104.  UA not concerning for infection at this time.  Laboratory studies overall reassuring, without clear etiology for patient's acute weakness.  At time of shift change patient is awaiting respiratory pathogen panel and CT of the head.  Will likely require neurology consult given history of Parkinson-like illness to discuss acute weakness and possible infective today's anesthesia during colonoscopy upon his neurologic disease.  Patient will likely require admission to the hospital given inability to perform ADLs or ambulate on his own.  Care of patient signed out to oncoming ED provider Antonietta Breach, PA-C at time of shift change.  All pertinent HPI, physical exam and laboratory findings were discussed with her prior to my departure.  I appreciate her collaboration of care of this patient.  This chart was dictated using voice recognition software, Dragon. Despite the best efforts of this provider to proofread and correct errors, errors may still occur which can change documentation meaning.     Final Clinical Impression(s) / ED Diagnoses Final diagnoses:  Generalized weakness  COVID-19    Rx / DC Orders ED Discharge Orders     None        Aura Dials 06/03/21 2128    Varney Biles, MD 06/04/21 1426

## 2021-06-01 NOTE — Op Note (Signed)
[place] Patient Name: Jared [name] Procedure Date: 06/01/2021 10:33 AM MRN: [id number] Endoscopist: Remo Lipps P. Havery Moros , MD Age: 70 Referring MD:  Date of Birth: 05-Aug-1951 Gender: Male Account #: [id number] Procedure:                Colonoscopy Indications:              High risk colon cancer surveillance: Personal                            history of colonic polyps (4 adenomas 05/2017),                            history of diverticulitis of sigmoid colon since                            his last exam Medicines:                Monitored Anesthesia Care Procedure:                Pre-Anesthesia Assessment:                           - Prior to the procedure, a History and Physical                            was performed, and patient medications and                            allergies were reviewed. The patient's tolerance of                            previous anesthesia was also reviewed. The risks                            and benefits of the procedure and the sedation                            options and risks were discussed with the patient.                            All questions were answered, and informed consent                            was obtained. Prior Anticoagulants: The patient has                            taken no previous anticoagulant or antiplatelet                            agents. ASA Grade Assessment: II - A patient with                            mild systemic disease. After reviewing the risks  and benefits, the patient was deemed in                            satisfactory condition to undergo the procedure.                           After obtaining informed consent, the colonoscope                            was passed under direct vision. Throughout the                            procedure, the patient's blood pressure, pulse, and                            oxygen saturations were monitored continuously.  The                            CF [id number] was introduced through the anus                            and advanced to the the cecum, identified by                            appendiceal orifice and ileocecal valve. The                            colonoscopy was performed without difficulty. The                            patient tolerated the procedure well. The quality                            of the bowel preparation was good. The ileocecal                            valve, appendiceal orifice, and rectum were                            photographed. Scope In: 10:42:47 AM Scope Out: [id number] AM Scope Withdrawal Time: 0 hours 20 minutes 6 seconds  Total Procedure Duration: 0 hours 24 minutes 31 seconds  Findings:                 The perianal and digital rectal examinations were                            normal.                           Three sessile polyps were found in the cecum. The                            polyps were 3 to 4 mm in size. These polyps were  removed with a cold snare. Resection and retrieval                            were complete.                           Two sessile polyps were found in the hepatic                            flexure. The polyps were 3 mm in size. These polyps                            were removed with a cold snare. Resection and                            retrieval were complete.                           A 3 to 4 mm polyp was found in the transverse                            colon. The polyp was sessile. The polyp was removed                            with a cold snare. Resection and retrieval were                            complete.                           Many medium-mouthed diverticula were found in the                            sigmoid colon, with some luminal narrowing. Time                            was taken to slowly traverse this area using water                            immersion. Low air  setting used. Mild                            diverticulosis in the ascending colon.                           Internal hemorrhoids were found during                            retroflexion. The hemorrhoids were small.                           The exam was otherwise without abnormality. Complications:            No immediate complications. Estimated blood loss:  Minimal. Estimated Blood Loss:     Estimated blood loss was minimal. Impression:               - Three 3 to 4 mm polyps in the cecum, removed with                            a cold snare. Resected and retrieved.                           - Two 3 mm polyps at the hepatic flexure, removed                            with a cold snare. Resected and retrieved.                           - One 3 to 4 mm polyp in the transverse colon,                            removed with a cold snare. Resected and retrieved.                           - Diverticulosis in the sigmoid colon (severe) and                            in the ascending colon (mild).                           - Internal hemorrhoids.                           - The examination was otherwise normal. Recommendation:           - Patient has a contact number available for                            emergencies. The signs and symptoms of potential                            delayed complications were discussed with the                            patient. Return to normal activities tomorrow.                            Written discharge instructions were provided to the                            patient.                           - Resume previous diet.                           - Continue present medications.                           -  Await pathology results. Remo Lipps P. Kiyana Vazguez, MD 06/01/2021 11:13:17 AM This report has been signed electronically.

## 2021-06-01 NOTE — Progress Notes (Signed)
[facility] History and Physical   Primary Care Physician:  Burnard Bunting, MD   Reason for Procedure:   History of colon polyps  Plan:    colonoscopy     HPI: Jared [name] is a 70 y.o. male  here for colonoscopy surveillance - 4 adenomas noted 05/2017. Patient denies any bowel symptoms at this time. Has had diverticulitis of sigmoid colon on a few occasions since last colonoscopy. No first degree family history of colon cancer known. Otherwise feels well without any cardiopulmonary symptoms. Asthma controlled.    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   Osteoarthritis    lower back   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   Renal cell carcinoma 1997   Left kidney   Sleep apnea    wears CPAP   Sleep talking    Snoring 12/25/2013   Wears glasses     Past Surgical History:  Procedure Laterality Date   ANTERIOR CERVICAL DECOMP/DISCECTOMY FUSION N/A 08/27/2014   Procedure: Cervical six-seven anterior cervical decompression fusion with removal of hardware at Cervical five-six;  Surgeon: Erline Levine, MD;  Location: [facility];  Service: Neurosurgery;  Laterality: N/A;  Cervical six-seven anterior cervical decompression fusion with removal of hardware at Cervical five-six   BACK SURGERY     1992, 2013 lower back   CARDIAC CATHETERIZATION  05/10/2007   Minimal CAD, normal LV systolic function, medical management    CARDIAC CATHETERIZATION  04/08/2008   RCA ulcerated 70-80% stenosis, stented with a 3x79m Endeavor stent at 13atm for 50sec, reduced from 80% ulcerated stenosis to 0%.   CARDIAC CATHETERIZATION  05/07/2009   50% distal left main disease-IVUS or flow wire too dangerous in particular setting to perform intervention.   CARDIAC CATHETERIZATION  03/18/2010   Medical management   CARDIOVASCULAR STRESS TEST  02/09/2012   Normal, no significant wall abnormalities noted   CARPAL TUNNEL RELEASE Bilateral 2000   COLONOSCOPY     COLONOSCOPY W/ BIOPSIES AND POLYPECTOMY     [place]OL  [place] [place]   NECK SURGERY  2005   NEPHRECTOMY Left 1997   secondary to cancer   TOTAL KNEE ARTHROPLASTY Left 2005   TOTAL KNEE ARTHROPLASTY Right 04/13/2018   Procedure: RIGHT TOTAL KNEE ARTHROPLASTY;  Surgeon: NNetta Cedars MD;  [place] [facility]  Service: Orthopedics;  Laterality: Right;   TRANSURETHRAL RESECTION OF PROSTATE N/A 03/25/2021   Procedure: TRANSURETHRAL RESECTION OF THE PROSTATE (TURP);  Surgeon: DFranchot Gallo MD;  [facility]  Service: Urology;  Laterality: N/A;    Prior to Admission medications   Medication Sig Start Date End Date Taking? Authorizing Provider  Alirocumab ([place] 150  MG/ML SOAJ Inject 150 mg into the skin every 14 (fourteen) days. 08/11/20  Yes Lorretta Harp, MD  amLODipine (NORVASC) 10 MG tablet Take 1 tablet by mouth daily.   Yes [provider]  ARIPiprazole (ABILIFY) 5 MG tablet Take 5 mg by mouth every morning. 05/21/20  Yes [provider]  betamethasone dipropionate 0.05 % cream Apply topically daily. 05/26/21  Yes [provider]  DULoxetine (CYMBALTA) 60 MG capsule Take 60 mg by mouth every morning. 04/22/21  Yes [provider]  esomeprazole (NEXIUM) 40 MG  capsule Take 1 capsule (40 mg total) by mouth daily at 12 noon. 05/25/21  Yes Kozlow, Donnamarie Poag, MD  fluticasone (FLOVENT HFA) 110 MCG/ACT inhaler 2 puffs twice a day with a spacer to prevent cough or wheeze. Rinse mouth after use. 05/25/21  Yes Kozlow, Donnamarie Poag, MD  isosorbide mononitrate (IMDUR) 30 MG 24 hr tablet take 1 tablet by mouth once daily Patient taking differently: at bedtime. 06/21/17  Yes Lorretta Harp, MD  levothyroxine (SYNTHROID) 25 MCG tablet Take 25 mcg by mouth every morning. 03/30/21  Yes [provider]  montelukast (SINGULAIR) 10 MG tablet TAKE 1 TABLET(10 MG) BY MOUTH AT BEDTIME 05/25/21  Yes Kozlow, Donnamarie Poag, MD  Multiple Vitamin (MULTIVITAMIN) tablet Take 1 tablet by mouth at bedtime.    Yes [provider]  silodosin (RAPAFLO) 8 MG CAPS capsule Take 8 mg by mouth at bedtime.    Yes [provider]  Spacer/Aero-Holding Chambers (AEROCHAMBER PLUS) inhaler Use as instructed 05/25/21  Yes Kozlow, Donnamarie Poag, MD  Spacer/Aero-Holding Chambers [facility] DIAMOND) MISC Use as directed with inhaler. 07/20/20  Yes Garnet Sierras, DO  telmisartan (MICARDIS) 20 MG tablet Take 20 mg by mouth daily. 02/20/20  Yes [provider]  traZODone (DESYREL) 50 MG tablet Take 0.5-1 tablets (25-50 mg total) by mouth at bedtime as needed for sleep. 04/21/21  Yes Dohmeier, [name], MD  Vibegron (GEMTESA) 75 MG TABS Take 1 Dose by mouth daily.   Yes [provider]  vitamin C (ASCORBIC ACID) 500 MG tablet Take 500 mg by mouth 2 (two) times daily.    Yes [provider]  VITAMIN D PO Take 50,000 Units by mouth once a week.   Yes [provider]  albuterol (VENTOLIN HFA) 108 (90 Base) MCG/ACT inhaler Can inhale two puffs every four to six hours as needed for cough, wheeze, shortness of breath, or chest tightness. 05/25/21   Kozlow, Donnamarie Poag, MD  ipratropium-albuterol (DUONEB) 0.5-2.5 (3) MG/3ML SOLN Take 3 mLs by nebulization every 6 (six) hours as needed (wheezing,  coughing, shortness of breath). 05/25/21   Kozlow, Donnamarie Poag, MD  [name] 150 MG injection INJECT '150MG'$  SUBCUTANEOUSLY EVERY 4 WEEKS (GIVEN AT MD  OFFICE) 07/10/20   Jiles Prows, MD    Current Outpatient Medications  Medication Sig Dispense Refill   Alirocumab (PRALUENT) 150 MG/ML SOAJ Inject 150 mg into the skin every 14 (fourteen) days. 2 mL 11   amLODipine (NORVASC) 10 MG tablet Take 1 tablet by mouth daily.     ARIPiprazole (ABILIFY) 5 MG tablet Take 5 mg by mouth every morning.     betamethasone dipropionate 0.05 % cream Apply topically daily.     DULoxetine (CYMBALTA) 60 MG capsule Take 60 mg by mouth every morning.     esomeprazole (NEXIUM) 40 MG capsule Take 1 capsule (40 mg total) by mouth daily at 12 noon. 30 capsule 5   fluticasone (FLOVENT HFA)  110 MCG/ACT inhaler 2 puffs twice a day with a spacer to prevent cough or wheeze. Rinse mouth after use. 1 each 5   isosorbide mononitrate (IMDUR) 30 MG 24 hr tablet take 1 tablet by mouth once daily (Patient taking differently: at bedtime.) 15 tablet 0   levothyroxine (SYNTHROID) 25 MCG tablet Take 25 mcg by mouth every morning.     montelukast (SINGULAIR) 10 MG tablet TAKE 1 TABLET(10 MG) BY MOUTH AT BEDTIME 90 tablet 1   Multiple Vitamin (MULTIVITAMIN) tablet Take 1 tablet by mouth at bedtime.      silodosin (RAPAFLO) 8 MG CAPS capsule Take 8 mg by mouth at bedtime.      Spacer/Aero-Holding Chambers (AEROCHAMBER PLUS) inhaler Use as instructed 1 each 2   Spacer/Aero-Holding Chambers (OPTICHAMBER DIAMOND) MISC Use as directed with inhaler. 1 each 0   telmisartan (MICARDIS) 20 MG tablet Take 20 mg by mouth daily.     traZODone (DESYREL) 50 MG tablet Take 0.5-1 tablets (25-50 mg total) by mouth at bedtime as needed for sleep. 30 tablet 5   Vibegron (GEMTESA) 75 MG TABS Take 1 Dose by mouth daily.     vitamin C (ASCORBIC ACID) 500 MG tablet Take 500 mg by mouth 2 (two) times daily.      VITAMIN D PO Take 50,000 Units by mouth once a week.      albuterol (VENTOLIN HFA) 108 (90 Base) MCG/ACT inhaler Can inhale two puffs every four to six hours as needed for cough, wheeze, shortness of breath, or chest tightness. 18 g 1   ipratropium-albuterol (DUONEB) 0.5-2.5 (3) MG/3ML SOLN Take 3 mLs by nebulization every 6 (six) hours as needed (wheezing, coughing, shortness of breath). 360 mL 1   XOLAIR 150 MG injection INJECT '150MG'$  SUBCUTANEOUSLY EVERY 4 WEEKS (GIVEN AT MD  OFFICE) 1 each 11   Current Facility-Administered Medications  Medication Dose Route Frequency Provider Last Rate Last Admin   0.9 %  sodium chloride infusion  500 mL Intravenous Once Claramae Rigdon, Carlota Raspberry, MD       omalizumab [name]) injection 150 mg  150 mg Subcutaneous Q28 days Dara Hoyer, FNP   150 mg at 05/25/21 1134    Allergies as of 06/01/2021 - Review Complete 06/01/2021  Allergen Reaction Noted   Contrast media [iodinated diagnostic agents] Other (See Comments) 10/12/2011   Nsaids Other (See Comments) 10/12/2011   Penicillins Hives, Itching, and Other (See Comments)    Sulfonamide derivatives Hives and Itching    Codeine Nausea Only    Statins Other (See Comments) 07/04/2013    Family History  Problem Relation Age of Onset   Asthma Mother    Heart disease Mother    Hypertension Mother    Thyroid disease Mother    Esophageal cancer Father    Barrett's esophagus Father    Bone cancer Father        mets from esophagus   Heart disease Father    Cancer Father    Depression Father    Anxiety disorder Father    Alcoholism Father    Obesity Father    Diabetes Sister    Cancer Sister        Cervical cancer   Colon cancer Maternal Uncle    Heart disease Paternal Grandfather    Heart attack Paternal Grandfather 17   Stomach cancer Neg Hx    Colon polyps Neg Hx    Rectal cancer Neg Hx     Social History   Socioeconomic History  Marital status: Married    Spouse name: Not on file   Number of children: 1   Years of education: 14   Highest  education level: Not on file  Occupational History   Occupation: OWNER. Landscape Design/horticulture    Employer: [employer]   Occupation: OWNER    Employer: [employer]  Tobacco Use   Smoking status: Never   Smokeless tobacco: Never  Vaping Use   Vaping Use: Never used  Substance and Sexual Activity   Alcohol use: Not Currently   Drug use: No   Sexual activity: Not Currently  Other Topics Concern   Not on file  Social History Narrative   Patient is married [name]).   Patient drinks one cup of coffee but not everyday.   Patient has one child.   Patient has a college education.   Patient is right-handed.   Social Determinants of Health   Financial Resource Strain: Not on file  Food Insecurity: Not on file  Transportation Needs: Not on file  Physical Activity: Not on file  Stress: Not on file  Social Connections: Not on file  Intimate Partner Violence: Not on file    Review of Systems: All other review of systems negative except as mentioned in the HPI.  Physical Exam: Vital signs BP 123/68   Pulse 78   Temp 97.8 F (36.6 C) (Temporal)   Resp 18   Ht '5\' 6"'$  (1.676 m)   Wt 204 lb (92.5 kg)   SpO2 97%   BMI 32.93 kg/m   General:   Alert,  Well-developed, well-nourished, pleasant and cooperative in NAD Lungs:  Clear throughout to auscultation.   Heart:  Regular rate and rhythm Abdomen:  Soft, nontender and nondistended.   Neuro/Psych:  Alert and cooperative. Normal mood and affect. A and O x 3  Jared Mango, MD [facility]

## 2021-06-01 NOTE — Progress Notes (Signed)
Called to room to assist during endoscopic procedure.  Patient ID and intended procedure confirmed with present staff. Received instructions for my participation in the procedure from the performing physician.  

## 2021-06-01 NOTE — ED Triage Notes (Signed)
Pt had a colonoscopy this morning, hx of prostate cancer surgery about 7 weeks ago and says he's been declining since then, progressively getting weaker He fell out of bed this evening, or rolled to the floor onto hardwood floors, denies any injury and isn't on blood thinners Pt states that he did eat a PB&J just before getting in the ambulance but prior to that hasn't had anything to eat or drink today or last night prior to his procedure.

## 2021-06-02 ENCOUNTER — Encounter (HOSPITAL_COMMUNITY): Payer: Self-pay | Admitting: Internal Medicine

## 2021-06-02 DIAGNOSIS — K219 Gastro-esophageal reflux disease without esophagitis: Secondary | ICD-10-CM

## 2021-06-02 DIAGNOSIS — G4733 Obstructive sleep apnea (adult) (pediatric): Secondary | ICD-10-CM | POA: Diagnosis present

## 2021-06-02 DIAGNOSIS — N138 Other obstructive and reflux uropathy: Secondary | ICD-10-CM

## 2021-06-02 DIAGNOSIS — N401 Enlarged prostate with lower urinary tract symptoms: Secondary | ICD-10-CM | POA: Diagnosis present

## 2021-06-02 DIAGNOSIS — Z6832 Body mass index (BMI) 32.0-32.9, adult: Secondary | ICD-10-CM | POA: Diagnosis not present

## 2021-06-02 DIAGNOSIS — E039 Hypothyroidism, unspecified: Secondary | ICD-10-CM | POA: Diagnosis present

## 2021-06-02 DIAGNOSIS — U071 COVID-19: Secondary | ICD-10-CM | POA: Diagnosis present

## 2021-06-02 DIAGNOSIS — Z8616 Personal history of COVID-19: Secondary | ICD-10-CM | POA: Diagnosis not present

## 2021-06-02 DIAGNOSIS — I129 Hypertensive chronic kidney disease with stage 1 through stage 4 chronic kidney disease, or unspecified chronic kidney disease: Secondary | ICD-10-CM | POA: Diagnosis present

## 2021-06-02 DIAGNOSIS — N1831 Chronic kidney disease, stage 3a: Secondary | ICD-10-CM | POA: Diagnosis present

## 2021-06-02 DIAGNOSIS — T43505A Adverse effect of unspecified antipsychotics and neuroleptics, initial encounter: Secondary | ICD-10-CM | POA: Diagnosis present

## 2021-06-02 DIAGNOSIS — E876 Hypokalemia: Secondary | ICD-10-CM | POA: Diagnosis present

## 2021-06-02 DIAGNOSIS — F32A Depression, unspecified: Secondary | ICD-10-CM | POA: Diagnosis present

## 2021-06-02 DIAGNOSIS — F419 Anxiety disorder, unspecified: Secondary | ICD-10-CM | POA: Diagnosis present

## 2021-06-02 DIAGNOSIS — J4550 Severe persistent asthma, uncomplicated: Secondary | ICD-10-CM | POA: Diagnosis present

## 2021-06-02 DIAGNOSIS — Z981 Arthrodesis status: Secondary | ICD-10-CM | POA: Diagnosis not present

## 2021-06-02 DIAGNOSIS — I10 Essential (primary) hypertension: Secondary | ICD-10-CM

## 2021-06-02 DIAGNOSIS — E782 Mixed hyperlipidemia: Secondary | ICD-10-CM | POA: Diagnosis present

## 2021-06-02 DIAGNOSIS — G2111 Neuroleptic induced parkinsonism: Secondary | ICD-10-CM | POA: Diagnosis present

## 2021-06-02 DIAGNOSIS — G219 Secondary parkinsonism, unspecified: Secondary | ICD-10-CM

## 2021-06-02 DIAGNOSIS — I2510 Atherosclerotic heart disease of native coronary artery without angina pectoris: Secondary | ICD-10-CM | POA: Diagnosis present

## 2021-06-02 DIAGNOSIS — R531 Weakness: Secondary | ICD-10-CM

## 2021-06-02 DIAGNOSIS — I252 Old myocardial infarction: Secondary | ICD-10-CM | POA: Diagnosis not present

## 2021-06-02 DIAGNOSIS — K5790 Diverticulosis of intestine, part unspecified, without perforation or abscess without bleeding: Secondary | ICD-10-CM | POA: Diagnosis present

## 2021-06-02 DIAGNOSIS — E669 Obesity, unspecified: Secondary | ICD-10-CM | POA: Diagnosis present

## 2021-06-02 DIAGNOSIS — J479 Bronchiectasis, uncomplicated: Secondary | ICD-10-CM | POA: Diagnosis present

## 2021-06-02 DIAGNOSIS — M25561 Pain in right knee: Secondary | ICD-10-CM | POA: Diagnosis present

## 2021-06-02 LAB — COMPREHENSIVE METABOLIC PANEL
ALT: 20 U/L (ref 0–44)
AST: 19 U/L (ref 15–41)
Albumin: 3.5 g/dL (ref 3.5–5.0)
Alkaline Phosphatase: 64 U/L (ref 38–126)
Anion gap: 10 (ref 5–15)
BUN: 7 mg/dL — ABNORMAL LOW (ref 8–23)
CO2: 25 mmol/L (ref 22–32)
Calcium: 8.9 mg/dL (ref 8.9–10.3)
Chloride: 108 mmol/L (ref 98–111)
Creatinine, Ser: 1.24 mg/dL (ref 0.61–1.24)
GFR, Estimated: >60 mL/min (ref >60)
Glucose, Bld: 111 mg/dL — ABNORMAL HIGH (ref 70–99)
Potassium: 3.5 mmol/L (ref 3.5–5.1)
Sodium: 143 mmol/L (ref 135–145)
Total Bilirubin: 0.8 mg/dL (ref 0.3–1.2)
Total Protein: 7.6 g/dL (ref 6.5–8.1)

## 2021-06-02 LAB — CBC WITH DIFFERENTIAL/PLATELET
Abs Immature Granulocytes: 0.09 10*3/uL — ABNORMAL HIGH (ref 0.00–0.07)
Basophils Absolute: 0.1 10*3/uL (ref 0.0–0.1)
Basophils Relative: 1 %
Eosinophils Absolute: 0.1 10*3/uL (ref 0.0–0.5)
Eosinophils Relative: 1 %
HCT: 38.7 % — ABNORMAL LOW (ref 39.0–52.0)
Hemoglobin: 12.2 g/dL — ABNORMAL LOW (ref 13.0–17.0)
Immature Granulocytes: 1 %
Lymphocytes Relative: 13 %
Lymphs Abs: 1.3 10*3/uL (ref 0.7–4.0)
MCH: 27.7 pg (ref 26.0–34.0)
MCHC: 31.5 g/dL (ref 30.0–36.0)
MCV: 87.8 fL (ref 80.0–100.0)
Monocytes Absolute: 1.1 10*3/uL — ABNORMAL HIGH (ref 0.1–1.0)
Monocytes Relative: 12 %
Neutro Abs: 7 10*3/uL (ref 1.7–7.7)
Neutrophils Relative %: 72 %
Platelets: 263 10*3/uL (ref 150–400)
RBC: 4.41 MIL/uL (ref 4.22–5.81)
RDW: 14.2 % (ref 11.5–15.5)
WBC: 9.7 10*3/uL (ref 4.0–10.5)
nRBC: 0 % (ref 0.0–0.2)

## 2021-06-02 LAB — TSH: TSH: 2.775 u[IU]/mL (ref 0.350–4.500)

## 2021-06-02 LAB — RESP PANEL BY RT-PCR (FLU A&B, COVID) ARPGX2
Influenza A by PCR: NEGATIVE
Influenza B by PCR: NEGATIVE
SARS Coronavirus 2 by RT PCR: POSITIVE — AB

## 2021-06-02 LAB — D-DIMER, QUANTITATIVE: D-Dimer, Quant: 1.35 ug/mL-FEU — ABNORMAL HIGH (ref 0.00–0.50)

## 2021-06-02 LAB — PROCALCITONIN: Procalcitonin: <0.1 ng/mL

## 2021-06-02 LAB — VITAMIN B12: Vitamin B-12: 581 pg/mL (ref 180–914)

## 2021-06-02 LAB — C-REACTIVE PROTEIN: CRP: 10.7 mg/dL — ABNORMAL HIGH (ref <1.0)

## 2021-06-02 MED ORDER — ONDANSETRON HCL 4 MG/2ML IJ SOLN: 4.0000 mg | Freq: Four times a day (QID) | INTRAMUSCULAR | Status: DC | PRN

## 2021-06-02 MED ORDER — DULOXETINE HCL 60 MG PO CPEP
60.0000 mg | ORAL_CAPSULE | Freq: Every morning | ORAL | Status: DC
  Administered 2021-06-02 – 2021-06-05 (×4): 60 mg via ORAL
  Filled 2021-06-02 (×2): qty 1
  Filled 2021-06-02: qty 2
  Filled 2021-06-02: qty 1

## 2021-06-02 MED ORDER — SODIUM CHLORIDE 0.9 % IV SOLN
100.0000 mg | Freq: Every day | INTRAVENOUS | Status: AC
  Administered 2021-06-03 – 2021-06-04 (×2): 100 mg via INTRAVENOUS
  Filled 2021-06-02 (×2): qty 20

## 2021-06-02 MED ORDER — AMLODIPINE BESYLATE 10 MG PO TABS
10.0000 mg | ORAL_TABLET | Freq: Every day | ORAL | Status: DC
  Administered 2021-06-02 – 2021-06-05 (×4): 10 mg via ORAL
  Filled 2021-06-02 (×2): qty 1
  Filled 2021-06-02: qty 2
  Filled 2021-06-02: qty 1

## 2021-06-02 MED ORDER — LACTATED RINGERS IV SOLN: INTRAVENOUS | Status: AC

## 2021-06-02 MED ORDER — ENOXAPARIN SODIUM 40 MG/0.4ML IJ SOSY
40.0000 mg | PREFILLED_SYRINGE | INTRAMUSCULAR | Status: DC
  Administered 2021-06-02 – 2021-06-05 (×4): 40 mg via SUBCUTANEOUS
  Filled 2021-06-02 (×4): qty 0.4

## 2021-06-02 MED ORDER — ARIPIPRAZOLE 5 MG PO TABS
5.0000 mg | ORAL_TABLET | Freq: Every morning | ORAL | Status: DC
  Administered 2021-06-02 – 2021-06-05 (×4): 5 mg via ORAL
  Filled 2021-06-02 (×4): qty 1

## 2021-06-02 MED ORDER — ISOSORBIDE MONONITRATE ER 30 MG PO TB24
30.0000 mg | ORAL_TABLET | Freq: Every day | ORAL | Status: DC
  Administered 2021-06-02 – 2021-06-04 (×3): 30 mg via ORAL
  Filled 2021-06-02 (×4): qty 1

## 2021-06-02 MED ORDER — PANTOPRAZOLE SODIUM 40 MG PO TBEC
40.0000 mg | DELAYED_RELEASE_TABLET | Freq: Every day | ORAL | Status: DC
  Administered 2021-06-02 – 2021-06-05 (×4): 40 mg via ORAL
  Filled 2021-06-02 (×4): qty 1

## 2021-06-02 MED ORDER — ONDANSETRON HCL 4 MG PO TABS: 4.0000 mg | ORAL_TABLET | Freq: Four times a day (QID) | ORAL | Status: DC | PRN

## 2021-06-02 MED ORDER — ZINC SULFATE 220 (50 ZN) MG PO CAPS
220.0000 mg | ORAL_CAPSULE | Freq: Every day | ORAL | Status: DC
  Administered 2021-06-02 – 2021-06-05 (×4): 220 mg via ORAL
  Filled 2021-06-02 (×4): qty 1

## 2021-06-02 MED ORDER — ACETAMINOPHEN 650 MG RE SUPP: 650.0000 mg | Freq: Four times a day (QID) | RECTAL | Status: DC | PRN

## 2021-06-02 MED ORDER — TAMSULOSIN HCL 0.4 MG PO CAPS
0.4000 mg | ORAL_CAPSULE | Freq: Every day | ORAL | Status: DC
  Administered 2021-06-02 – 2021-06-04 (×3): 0.4 mg via ORAL
  Filled 2021-06-02 (×3): qty 1

## 2021-06-02 MED ORDER — FLUTICASONE PROPIONATE HFA 110 MCG/ACT IN AERO
2.0000 | INHALATION_SPRAY | Freq: Two times a day (BID) | RESPIRATORY_TRACT | Status: DC
  Administered 2021-06-02 – 2021-06-04 (×5): 2 via RESPIRATORY_TRACT
  Filled 2021-06-02: qty 12

## 2021-06-02 MED ORDER — SODIUM CHLORIDE 0.9 % IV SOLN
100.0000 mg | Freq: Once | INTRAVENOUS | Status: AC
  Administered 2021-06-02: 100 mg via INTRAVENOUS
  Filled 2021-06-02: qty 20

## 2021-06-02 MED ORDER — ASCORBIC ACID 500 MG PO TABS
500.0000 mg | ORAL_TABLET | Freq: Every day | ORAL | Status: DC
  Administered 2021-06-02 – 2021-06-05 (×4): 500 mg via ORAL
  Filled 2021-06-02 (×4): qty 1

## 2021-06-02 MED ORDER — IRBESARTAN 150 MG PO TABS
75.0000 mg | ORAL_TABLET | Freq: Every day | ORAL | Status: DC
  Administered 2021-06-02 – 2021-06-05 (×4): 75 mg via ORAL
  Filled 2021-06-02 (×4): qty 1

## 2021-06-02 MED ORDER — SODIUM CHLORIDE 0.9 % IV SOLN: 100.0000 mg | Freq: Every day | INTRAVENOUS | Status: DC

## 2021-06-02 MED ORDER — POLYETHYLENE GLYCOL 3350 17 G PO PACK: 17.0000 g | PACK | Freq: Every day | ORAL | Status: DC | PRN

## 2021-06-02 MED ORDER — ACETAMINOPHEN 325 MG PO TABS
650.0000 mg | ORAL_TABLET | Freq: Four times a day (QID) | ORAL | Status: DC | PRN
  Administered 2021-06-04: 650 mg via ORAL
  Filled 2021-06-02: qty 2

## 2021-06-02 MED ORDER — TRAZODONE HCL 50 MG PO TABS
25.0000 mg | ORAL_TABLET | Freq: Every day | ORAL | Status: AC
  Administered 2021-06-02 – 2021-06-03 (×2): 50 mg via ORAL
  Filled 2021-06-02 (×2): qty 1

## 2021-06-02 MED ORDER — GUAIFENESIN-DM 100-10 MG/5ML PO SYRP
10.0000 mL | ORAL_SOLUTION | ORAL | Status: DC | PRN
  Administered 2021-06-02: 10 mL via ORAL
  Filled 2021-06-02: qty 10

## 2021-06-02 MED ORDER — VIBEGRON 75 MG PO TABS: 1.0000 | ORAL_TABLET | Freq: Every day | ORAL | Status: DC

## 2021-06-02 MED ORDER — ALBUTEROL SULFATE HFA 108 (90 BASE) MCG/ACT IN AERS
2.0000 | INHALATION_SPRAY | Freq: Four times a day (QID) | RESPIRATORY_TRACT | Status: DC
  Administered 2021-06-02 (×2): 2 via RESPIRATORY_TRACT
  Filled 2021-06-02: qty 6.7

## 2021-06-02 MED ORDER — LEVOTHYROXINE SODIUM 25 MCG PO TABS
25.0000 ug | ORAL_TABLET | Freq: Every morning | ORAL | Status: DC
  Administered 2021-06-02 – 2021-06-05 (×4): 25 ug via ORAL
  Filled 2021-06-02 (×4): qty 1

## 2021-06-02 MED ORDER — SODIUM CHLORIDE 0.9 % IV SOLN: 200.0000 mg | Freq: Once | INTRAVENOUS | Status: DC

## 2021-06-02 MED ORDER — MONTELUKAST SODIUM 10 MG PO TABS
10.0000 mg | ORAL_TABLET | Freq: Every day | ORAL | Status: DC
  Administered 2021-06-02 – 2021-06-04 (×3): 10 mg via ORAL
  Filled 2021-06-02 (×4): qty 1

## 2021-06-02 NOTE — ED Provider Notes (Signed)
2:07 AM CT head with atrophy, but no acute intracranial abnormality.  Labs have generally been reassuring.  No objective findings to explain acute change in patient's functional status.    Patient to be admitted to the hospitalist service for further evaluation of his worsening weakness.  Neurology service to consult with recommendations.  Case was discussed with Dr. Curly Shores.   Antonietta Breach, PA-C 06/02/21 [id number]    Veryl Speak, MD 06/02/21 [phone]

## 2021-06-02 NOTE — ED Notes (Signed)
Pt soiled the bed. Pt cleaned. Linen changed. Male external catheter placed on pt.

## 2021-06-02 NOTE — ED Notes (Signed)
Lunch tray given to pt.

## 2021-06-02 NOTE — H&P (Signed)
History and Physical    Jared [name] [id number] DOB: 05-Oct-1950 DOA: 06/01/2021  PCP: Burnard Bunting, MD  Patient coming from: Home   Chief Complaint:  Chief Complaint  Patient presents with   Fall   Weakness     HPI:    70 year old male with past medical history of cervical spine fusion  (2015), neuroleptic induced parkinsonism, benign prostatic hyperplasia (status post TURP 03/24/2021), hypertension, obstructive sleep apnea, chronic kidney disease stage IIIa, depression, hyperlipidemia, hypothyroidism, gastroesophageal reflux disease and severe persistent asthma who presents to [facility] emergency department with progressively worsening weakness and fall.  Patient explains that ever since mid July, shortly after he underwent his TURP on 7/7 he has felt like he is in a gradual state of decline.  He states that he feels that he has developed increasing generalized weakness and the wheeze that followed despite taking his medications as instructed.  It is also worth noting that the patient was resumed on regular Xolair injections in mid July as well.  Patient complains of continued generalized weakness malaise and fatigue throughout the remainder of July and August.  Then, approximately 3 to 4 days prior to his presentation patient began to experience a rapid progression of his generalized weakness and fatigue, making it extremely difficult for him to even sit up in bed.  Patient also reports associated poor appetite and dry nonproductive cough over this 3 to 4-day span as well.  Patient denies sick contacts, recent travel or contact with confirmed COVID-19 infection.  Earlier in the day on 9/13 the patient underwent a regularly scheduled colonoscopy with Dr. Havery Moros with gastroenterology.  Colonoscopy revealed multiple sessile polyps which were removed as well as identification of severe diverticulosis.  The procedure went without complication and the patient was  discharged home.  Shortly after arriving home, the patient states that he was so weak that he rolled out of bed and fell to the floor.  Because of his profound weakness he was unable to get up and it was at this point that the patient was brought into [facility] emergency department for evaluation.  Upon evaluation in the emergency department initial work-up was found to be relatively unremarkable with an equivocal urinalysis, unremarkable chest x-ray and unremarkable CT head.  Due to patient's ongoing weakness and inability to ambulate the hospitalist group was then called to assess the patient for admission to the hospital.  Review of Systems:   Review of Systems  Constitutional:  Positive for malaise/fatigue.  Musculoskeletal:  Positive for falls.  Neurological:  Positive for weakness.  All other systems reviewed and are negative.  Past Medical History:  Diagnosis Date   Anxiety    Asthma    Cervical vertebral fusion 05/02/2014   Now presenting with C5-6 radiculopathy   Coronary artery disease    COVID 01/2020   pain all over monoclonal antibodies given all symptoms resolved   GERD (gastroesophageal reflux disease)    H/O heart artery stent 2000   to right rca   Heart attack ([place]) 2009   History of depression 03/19/2021   History of esophageal stricture    History of spinal fracture 2015   Hyperlipidemia    Hypertension    Hypothyroidism    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[place])    left arm tremor   Osteoarthritis    lower back   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   Renal cell carcinoma 1997   Left kidney   Sleep apnea  wears CPAP   Sleep talking    Snoring 12/25/2013   Wears glasses     Past Surgical History:  Procedure Laterality Date   ANTERIOR CERVICAL DECOMP/DISCECTOMY FUSION N/A 08/27/2014   Procedure: Cervical six-seven anterior cervical decompression fusion with removal of hardware at Cervical five-six;  Surgeon: Erline Levine, MD;   Location: [place] [facility];  Service: Neurosurgery;  Laterality: N/A;  Cervical six-seven anterior cervical decompression fusion with removal of hardware at Cervical five-six   BACK SURGERY     1992, 2013 lower back   CARDIAC CATHETERIZATION  05/10/2007   Minimal CAD, normal LV systolic function, medical management   CARDIAC CATHETERIZATION  04/08/2008   RCA ulcerated 70-80% stenosis, stented with a 3x71m Endeavor stent at 13atm for 50sec, reduced from 80% ulcerated stenosis to 0%.   CARDIAC CATHETERIZATION  05/07/2009   50% distal left main disease-IVUS or flow wire too dangerous in particular setting to perform intervention.   CARDIAC CATHETERIZATION  03/18/2010   Medical management   CARDIOVASCULAR STRESS TEST  02/09/2012   Normal, no significant wall abnormalities noted   CARPAL TUNNEL RELEASE Bilateral 2000   COLONOSCOPY     COLONOSCOPY W/ BIOPSIES AND POLYPECTOMY     COLONOSCOPY WITH PROPOFOL  06/01/2021   [facility]    as infant groin   NECK SURGERY  2005   NEPHRECTOMY Left 1997   secondary to cancer   TOTAL KNEE ARTHROPLASTY Left 2005   TOTAL KNEE ARTHROPLASTY Right 04/13/2018   Procedure: RIGHT TOTAL KNEE ARTHROPLASTY;  Surgeon: NNetta Cedars MD;  [facility] [facility]  Service: Orthopedics;  Laterality: Right;   TRANSURETHRAL RESECTION OF PROSTATE N/A 03/25/2021   Procedure: TRANSURETHRAL RESECTION OF THE PROSTATE (TURP);  Surgeon: DFranchot Gallo MD;  [facility]  Service: Urology;  Laterality: N/A;     reports that he has never smoked. He has never used smokeless tobacco. He reports that he does not currently use alcohol. He reports that he does not use drugs.  Allergies  Allergen Reactions   Contrast Media [Iodinated Diagnostic Agents] Other (See Comments)    Was told not to take d/t pt only having 1 kidney   Nsaids Other (See Comments)    Was told  not to take d/t pt only having 1 kidney   Penicillins Hives, Itching and Other (See Comments)    Has patient had a PCN reaction causing immediate rash, facial/tongue/throat swelling, SOB or lightheadedness with hypotension: Unknown Has patient had a PCN reaction causing severe rash involving mucus membranes or skin necrosis: Unknown Has patient had a PCN reaction that required hospitalization: Unknown Has patient had a PCN reaction occurring within the last 10 years: No If all of the above answers are "NO", then may proceed with Cephalosporin use.    Sulfonamide Derivatives Hives and Itching   Codeine Nausea Only   Statins Other (See Comments)    Myalgias    Family History  Problem Relation Age of Onset   Asthma Mother    Heart disease Mother    Hypertension Mother    Thyroid disease Mother    Esophageal cancer Father    Barrett's esophagus Father    Bone cancer Father        mets from esophagus   Heart disease Father    Cancer Father    Depression Father    Anxiety disorder  Father    Alcoholism Father    Obesity Father    Diabetes Sister    Cancer Sister        Cervical cancer   Colon cancer Maternal Uncle    Heart disease Paternal Grandfather    Heart attack Paternal Grandfather 39   Stomach cancer Neg Hx    Colon polyps Neg Hx    Rectal cancer Neg Hx      Prior to Admission medications   Medication Sig Start Date End Date Taking? Authorizing Provider  albuterol (VENTOLIN HFA) 108 (90 Base) MCG/ACT inhaler Can inhale two puffs every four to six hours as needed for cough, wheeze, shortness of breath, or chest tightness. 05/25/21   Kozlow, Donnamarie Poag, MD  Alirocumab (PRALUENT) 150 MG/ML SOAJ Inject 150 mg into the skin every 14 (fourteen) days. 08/11/20   Lorretta Harp, MD  amLODipine (NORVASC) 10 MG tablet Take 1 tablet by mouth daily.    [provider]  ARIPiprazole (ABILIFY) 5 MG tablet Take 5 mg by mouth every morning. 05/21/20   [provider]   betamethasone dipropionate 0.05 % cream Apply topically daily. 05/26/21   [provider]  DULoxetine (CYMBALTA) 60 MG capsule Take 60 mg by mouth every morning. 04/22/21   [provider]  esomeprazole (NEXIUM) 40 MG capsule Take 1 capsule (40 mg total) by mouth daily at 12 noon. 05/25/21   Kozlow, Donnamarie Poag, MD  fluticasone (FLOVENT HFA) 110 MCG/ACT inhaler 2 puffs twice a day with a spacer to prevent cough or wheeze. Rinse mouth after use. 05/25/21   Kozlow, Donnamarie Poag, MD  ipratropium-albuterol (DUONEB) 0.5-2.5 (3) MG/3ML SOLN Take 3 mLs by nebulization every 6 (six) hours as needed (wheezing, coughing, shortness of breath). 05/25/21   Kozlow, Donnamarie Poag, MD  isosorbide mononitrate (IMDUR) 30 MG 24 hr tablet take 1 tablet by mouth once daily Patient taking differently: at bedtime. 06/21/17   Lorretta Harp, MD  levothyroxine (SYNTHROID) 25 MCG tablet Take 25 mcg by mouth every morning. 03/30/21   [provider]  montelukast (SINGULAIR) 10 MG tablet TAKE 1 TABLET(10 MG) BY MOUTH AT BEDTIME 05/25/21   Kozlow, Donnamarie Poag, MD  Multiple Vitamin (MULTIVITAMIN) tablet Take 1 tablet by mouth at bedtime.     [provider]  silodosin (RAPAFLO) 8 MG CAPS capsule Take 8 mg by mouth at bedtime.     [provider]  Spacer/Aero-Holding Chambers (AEROCHAMBER PLUS) inhaler Use as instructed 05/25/21   Kozlow, Donnamarie Poag, MD  Spacer/Aero-Holding Chambers [facility] DIAMOND) MISC Use as directed with inhaler. 07/20/20   Garnet Sierras, DO  telmisartan (MICARDIS) 20 MG tablet Take 20 mg by mouth daily. 02/20/20   [provider]  traZODone (DESYREL) 50 MG tablet Take 0.5-1 tablets (25-50 mg total) by mouth at bedtime as needed for sleep. 04/21/21   Dohmeier, [name], MD  Vibegron (GEMTESA) 75 MG TABS Take 1 Dose by mouth daily.    [provider]  vitamin C (ASCORBIC ACID) 500 MG tablet Take 500 mg by mouth 2 (two) times daily.     [provider]  VITAMIN D PO Take 50,000  Units by mouth once a week.    [provider]  [name] 150 MG injection INJECT '150MG'$  SUBCUTANEOUSLY EVERY 4 WEEKS (GIVEN AT MD  OFFICE) 07/10/20   Jiles Prows, MD    Physical Exam: Vitals:   06/02/21 0000 06/02/21 0100 06/02/21 0500 06/02/21 0508  BP: 137/79 (!) 149/75 Marland Kitchen)  158/77   Pulse: 89 87 94   Resp: '18 18 18   '$ Temp:    99 F (37.2 C)  TempSrc:      SpO2: 95% 96% 94%     Constitutional: Awake alert and oriented x3, no associated distress.   Skin: no rashes, no lesions, poor skin turgor noted. Eyes: Pupils are equally reactive to light.  No evidence of scleral icterus or conjunctival pallor.  ENMT: Dry mucous membranes noted.  Posterior pharynx clear of any exudate or lesions.   Neck: normal, supple, no masses, no thyromegaly.  No evidence of jugular venous distension.   Respiratory: Bibasilar and mild mid field rales as well.  No significant evidence of wheezing.  Patient does seem to have some increased respiratory effort without accessory muscle use.  Scattered rhonchi bilaterally.   Cardiovascular: Regular rate and rhythm, no murmurs / rubs / gallops. No extremity edema. 2+ pedal pulses. No carotid bruits.  Chest:   Nontender without crepitus or deformity.   Back:   Nontender without crepitus or deformity. Abdomen: Abdomen is soft and nontender.  No evidence of intra-abdominal masses.  Positive bowel sounds noted in all quadrants.   Musculoskeletal: No joint deformity upper and lower extremities. Good ROM, no contractures. Normal muscle tone.  Neurologic: Notable tremor at rest.  Strength is 5 out of 5 in all extremities in both proximal and distal muscle groups however.  Sensation is grossly intact.    Patient is responsive to verbal stimuli.   Psychiatric: Patient exhibits normal mood with appropriate affect.  Patient seems to possess insight as to their current situation.     Labs on Admission: I have personally reviewed following labs and imaging studies -    CBC: Recent Labs  Lab 06/01/21 2053  WBC 10.3  NEUTROABS 7.9*  HGB 12.3*  HCT 38.1*  MCV 85.2  PLT [id number]   Basic Metabolic Panel: Recent Labs  Lab 06/01/21 2053  NA 144  K 3.4*  CL 109  CO2 24  GLUCOSE 108*  BUN 9  CREATININE 1.32*  CALCIUM 9.0   GFR: Estimated Creatinine Clearance: 53.9 mL/min (A) (by C-G formula based on SCr of 1.32 mg/dL (H)). Liver Function Tests: Recent Labs  Lab 06/01/21 2053  AST 19  ALT 22  ALKPHOS 68  BILITOT 0.7  PROT 7.9  ALBUMIN 3.6   No results for input(s): LIPASE, AMYLASE in the last 168 hours. No results for input(s): AMMONIA in the last 168 hours. Coagulation Profile: No results for input(s): INR, PROTIME in the last 168 hours. Cardiac Enzymes: Recent Labs  Lab 06/01/21 2312  CKTOTAL 91   BNP (last 3 results) No results for input(s): PROBNP in the last 8760 hours. HbA1C: No results for input(s): HGBA1C in the last 72 hours. CBG: No results for input(s): GLUCAP in the last 168 hours. Lipid Profile: No results for input(s): CHOL, HDL, LDLCALC, TRIG, CHOLHDL, LDLDIRECT in the last 72 hours. Thyroid Function Tests: No results for input(s): TSH, T4TOTAL, FREET4, T3FREE, THYROIDAB in the last 72 hours. Anemia Panel: Recent Labs    06/02/21 0149  VITAMINB12 581   Urine analysis:    Component Value Date/Time   COLORURINE YELLOW 06/01/2021 2054   APPEARANCEUR CLEAR 06/01/2021 2054   LABSPEC 1.010 06/01/2021 2054   PHURINE 6.0 06/01/2021 2054   GLUCOSEU NEGATIVE 06/01/2021 2054   HGBUR TRACE (A) 06/01/2021 2054   BILIRUBINUR NEGATIVE 06/01/2021 2054   [place] 06/01/2021 2054   PROTEINUR NEGATIVE 06/01/2021 2054  UROBILINOGEN 0.2 03/17/2010 1452   NITRITE NEGATIVE 06/01/2021 2054   LEUKOCYTESUR TRACE (A) 06/01/2021 2054    Radiological Exams on Admission - Personally Reviewed: CT HEAD WO CONTRAST (5MM)  Result Date: 06/01/2021 CLINICAL DATA:  Head trauma.  Weakness. EXAM: CT HEAD WITHOUT CONTRAST  TECHNIQUE: Contiguous axial images were obtained from the base of the skull through the vertex without intravenous contrast. COMPARISON:  None. FINDINGS: Brain: There is no mass, hemorrhage or extra-axial collection. The appearance of the white matter is normal for the patient's age. There is generalized atrophy. Vascular: No abnormal hyperdensity of the major intracranial arteries or dural venous sinuses. No intracranial atherosclerosis. Skull: The visualized skull base, calvarium and extracranial soft tissues are normal. Sinuses/Orbits: No fluid levels or advanced mucosal thickening of the visualized paranasal sinuses. No mastoid or middle ear effusion. The orbits are normal. IMPRESSION: Generalized atrophy without acute intracranial abnormality. Electronically Signed   By: Ulyses Jarred M.D.   On: 06/01/2021 23:40   DG Chest Portable 1 View  Result Date: 06/01/2021 CLINICAL DATA:  Weakness after colonoscopy this morning, fell out of bed EXAM: PORTABLE CHEST 1 VIEW COMPARISON:  04/02/2020 FINDINGS: Single frontal view of the chest demonstrates an enlarged cardiac silhouette, likely exaggerated by AP portable technique. No airspace disease, effusion, or pneumothorax. No acute bony abnormalities. IMPRESSION: 1. No acute intrathoracic process. Electronically Signed   By: Randa Ngo M.D.   On: 06/01/2021 21:50    EKG: Personally reviewed.  Rhythm is sinus tachycardia with heart rate of 102 bpm.  No dynamic ST segment changes appreciated.  Assessment/Plan Principal Problem: Generalized weakness and fall at home in the setting of new COVID-19 virus infection  Patient provides a rather vague and protracted history spanning over approximately 2 and half months of ongoing weakness However upon a thorough evaluation, it seems that patient's significant weakness seems to have really started only over the past 3 to 4 days and has been associated with poor appetite and dry nonproductive cough COVID-19 PCR  testing has been found to be positive here, likely contributing to the lion share of the patient's symptoms. If weakness fails to improve with resolving COVID-19 infection, per my discussion with neurology we will then proceed to expand work-up which would include MRI of the cervical spine considering cervical spine considering history of cervical spine stenosis Due to patient's comorbidities and extremely high risk of clinical progression to severe disease, patient will be provided with a 3 day course of intravenous remdesivir Patient will be gently hydrated with intravenous isotonic fluids Patient will be provided with bronchodilator therapy and antitussives as necessary Providing patient with zinc and vitamin C supplementation Airborne and contact isolation Obtaining physical therapy evaluation  Active Problems:      Essential hypertension  Continuing home regimen of antihypertensive therapy As needed intravenous antihypertensives for markedly elevated blood pressure    Parkinsonism ([place])  Thought to be neuroleptic induced Continue outpatient follow-up with neurology    Hypokalemia, inadequate intake  Replacing with potassium chloride    Hypothyroidism  Continue home regimen of Synthroid    Chronic kidney disease, stage 3a (HCC)  Strict intake and output monitoring Creatinine near baseline Minimizing nephrotoxic agents as much as possible Serial chemistries to monitor renal function and electrolytes    GERD without esophagitis  Continue home regimen of proton pump inhibitor    BPH with obstruction/lower urinary tract symptoms  Continue home regimen of Flomax   Code Status:  Full code  code status decision  has been confirmed with: patient Family Communication: deferred   Status is: Observation  The patient remains OBS appropriate and will d/c before 2 midnights.  Dispo: The patient is from: Home              Anticipated d/c is to: Home              Patient  currently is not medically stable to d/c.   Difficult to place patient Yes        Vernelle Emerald MD [facility] Pager [phone]  If 7PM-7AM, please contact night-coverage [email or web address] Use universal [place] password for that web site. If you do not have the password, please call the hospital operator.  06/02/2021, 5:15 AM

## 2021-06-02 NOTE — ED Notes (Signed)
Attempted to ambulate pt. Pt requires max. Assist getting to EOB. Pt states "I'm too weak". Deferred ambulation at this time. RN aware.

## 2021-06-02 NOTE — Care Plan (Signed)
Neurology [facility]  HPI: Jared [name] is a 70 y.o. male with a past medical history significant for cervical spinal fusion (2015), hypertension, hyperlipidemia, sleep apnea on CPAP, anxiety/depression, parkinsonism (potentially neuroleptic), REM sleep behavior disorder, left renal cell carcinoma.  Review of outpatient neurology notes does suggest that he has some memory impairment (for example reporting he was off of Abilify for 1 year with subsequent confirmation that he was taking Abilify).  He is planned for [facility] for further work-up of potential Parkinson's disease versus drug-induced parkinsonism  I have reviewed labs in epic and the results pertinent to this consultation are: CMP with creatinine of 1.32 (baseline 1.1-1.3), mild hypokalemia at 3.4, mild hyperglycemia at 108 CBC with normocytic anemia of 12.  B12 518 CK 91 Troponin negative x2, BNP 104.8 UA with trace leukocytes, trace hemoglobin, rare bacteria, otherwise negative for infection  Flu panel resulted positive for COVID-19 infection  I have reviewed the images obtained:  Head CT personally reviewed, negative for acute intracranial process  Impression: ED provider was concerned about acute on chronic decline given the patient was reporting 2 months of progressive weakness with acute change after discharge from EGD yesterday.  On admission to hospitalist team, found to have a new COVID-19 infection which was felt to be clinically significant and the likely culprit for his acute decline.  In light of this, primary team did not feel full neurology consult was needed at this time. Given his history of significant cervical spine disease, and report of global weakness involving upper and lower extremities, should his weakness be out of proportion to his infection MRI cervical spine should be considered. We will be available should further acute neurological questions arise -- please do reach out if neurology services are  needed.  Lesleigh Noe MD-PhD [facility] [phone] Available 7 PM to 7 AM, outside of these hours please call Neurologist on call as listed on Amion.

## 2021-06-02 NOTE — Evaluation (Addendum)
Physical Therapy Evaluation Patient Details Name: Jared [name] MRN: [id number] DOB: 1950/12/24 Today's Date: 06/02/2021  History of Present Illness  70 year old male with past medical history of cervical spine fusion  , neuroleptic induced parkinsonism, benign prostatic hyperplasia (status post TURP 03/24/2021), hypertension, obstructive sleep apnea, chronic kidney disease stage IIIa, depression, hyperlipidemia, hypothyroidism, gastroesophageal reflux disease and severe persistent asthma, colocnscopy 9/13/.22 who presents to [facility] long hospital emergency department  06/01/21 with progressively worsening weakness and fall. Positive for Covid.  Clinical Impression  Patient demonstrates significant  decline in functional mobility, total assist to to attempt sitting on bed edge. Much difficulty in mobility with inability to sit fully upright, tendency for posterior leaning. Per patient, he  has declined since  TURP in July.. Patient presents with  L arm tremor being unable to steady tremor to assist in cutting his french toast. At baseline, patient was going to gym 3 x's per week and did not use a  assistive device.  Patient reports wife is sick with covid.   Pt admitted with above diagnosis.  Pt currently with functional limitations due to the deficits listed below (see PT Problem List). Pt will benefit from skilled PT to increase their independence and safety with mobility to allow discharge to the venue listed below.        Recommendations for follow up therapy are one component of a multi-disciplinary discharge planning process, led by the attending physician.  Recommendations may be updated based on patient status, additional functional criteria and insurance authorization.  Follow Up Recommendations SNF    Equipment Recommendations  None recommended by PT    Recommendations for Other Services OT consult     Precautions / Restrictions Precautions Precautions: Fall      Mobility   Bed Mobility Overal bed mobility: Needs Assistance Bed Mobility: Supine to Sit;Sit to Supine     Supine to sit: HOB elevated;Total assist Sit to supine: Total assist   General bed mobility comments: patient  held onto rail to  turn to left side.Max assist to place legs over bed edge. Attempted to raise trunk to sitting with /total, never able to get upright, significant posterior pull. assisted back into bed. HOB raised so patient could eat meal.    Transfers                 General transfer comment: unable  Ambulation/Gait                Stairs            Wheelchair Mobility    Modified Rankin (Stroke Patients Only)       Balance Overall balance assessment: Needs assistance;History of Falls     Sitting balance - Comments: unable to get fully upright. leaning posterior(only 1 person available)                                     Pertinent Vitals/Pain Pain Assessment: No/denies pain    Home Living Family/patient expects to be discharged to:: Private residence Living Arrangements: Spouse/significant other Available Help at Discharge: Family;Available 24 hours/day Type of Home: House Home Access: Stairs to enter   [employer] of Steps: slope Home Layout: One level Home Equipment: Walker - 2 wheels;Cane - single point Additional Comments: wife + for Covid    Prior Function Level of Independence: Needs assistance   Gait / Transfers Assistance Needed: reports prior to  TURP, worked out at gym 3x wk, has gradually declined and used RW recently  ADL's / Land Needed: recently requires assistnace from wife        Hand Dominance        Extremity/Trunk Assessment   Upper Extremity Assessment Upper Extremity Assessment: RUE deficits/detail;LUE deficits/detail RUE Deficits / Details: grossly WFL LUE Deficits / Details: significant  resting tremor that worsens with intension, unable to hold cup or utensil  to cut toast.    Lower Extremity Assessment Lower Extremity Assessment: Generalized weakness    Cervical / Trunk Assessment Cervical / Trunk Assessment: Other exceptions Cervical / Trunk Exceptions: never assumed complete upright position  Communication   Communication: No difficulties  Cognition Arousal/Alertness: Awake/alert Behavior During Therapy: WFL for tasks assessed/performed Overall Cognitive Status: Within Functional Limits for tasks assessed                                 General Comments: off of dat by 1 day      General Comments      Exercises     Assessment/Plan    PT Assessment Patient needs continued PT services  PT Problem List Decreased strength;Decreased balance;Decreased cognition;Decreased knowledge of precautions;Impaired tone;Decreased mobility;Decreased knowledge of use of DME;Decreased activity tolerance;Decreased coordination;Decreased safety awareness       PT Treatment Interventions DME instruction;Functional mobility training;Balance training;Patient/family education;Gait training;Therapeutic activities;Neuromuscular re-education;Therapeutic exercise    PT Goals (Current goals can be found in the Care Plan section)  Acute Rehab PT Goals Patient Stated Goal: to feel better and walk PT Goal Formulation: With patient Time For Goal Achievement: 06/16/21 Potential to Achieve Goals: Fair    Frequency Min 2X/week   Barriers to discharge Decreased caregiver support      Co-evaluation               AM-PAC PT "6 Clicks" Mobility  Outcome Measure Help needed turning from your back to your side while in a flat bed without using bedrails?: Total Help needed moving from lying on your back to sitting on the side of a flat bed without using bedrails?: Total Help needed moving to and from a bed to a chair (including a wheelchair)?: Total Help needed standing up from a chair using your arms (e.g., wheelchair or bedside chair)?:  Total Help needed to walk in hospital room?: Total Help needed climbing 3-5 steps with a railing? : Total 6 Click Score: 6    End of Session   Activity Tolerance: Patient limited by fatigue Patient left: in bed;with call bell/phone within reach Nurse Communication: Mobility status;Need for lift equipment PT Visit Diagnosis: Unsteadiness on feet (R26.81);History of falling (Z91.81);Other symptoms and signs involving the nervous system (R29.898)    Time: [id number] PT Time Calculation (min) (ACUTE ONLY): 31 min   Charges:   PT Evaluation $PT Eval Low Complexity: 1 Low PT Treatments $Therapeutic Activity: 8-22 mins        Tresa Endo PT Acute Rehabilitation Services Pager [phone] Office [phone]   Claretha Cooper 06/02/2021, 1:46 PM

## 2021-06-02 NOTE — Progress Notes (Signed)
PROGRESS NOTE    Jared [name]  [id number] DOB: April 11, 1951 DOA: 06/01/2021 PCP: Burnard Bunting, MD   Brief Narrative: Jared [name] is a 70 y.o. male with a history of cervical spine fusion, neuroleptic induced parkinsonism, BPH s/p TURP, hypertension, OSA, CKD, depression, hyperlipidemia, hypothyroidism, GERD, asthma. Patient presented secondary to progressively worsening weakness and a fall. He was found to be positive for COVID-19. Started on Remdesivir.   Assessment & Plan:   Principal Problem:   COVID-19 virus infection Active Problems:   Mixed hyperlipidemia   GERD without esophagitis   Essential hypertension   Parkinsonism (HCC)   BPH with obstruction/lower urinary tract symptoms   Generalized weakness   Hypokalemia, inadequate intake   Hypothyroidism   Chronic kidney disease, stage 3a (HCC)   Generalized weakness Associated fall. Possibly secondary to COVID-19 infection. In setting of underlying parkinsonism as well. PT consulted and are recommending SNF. TOC consulted for placement. Neurology consulted while patient was in the ED with recommendations to treat infection; if no improvement, re-consult/MRI brain and cervical spine  COVID-19 infection Sick contacts include his wife. Patient reports no other significant symptoms. On room air. Started on Remdesivir on admission. No steroids initiated secondary to lack of hypoxia. CRP of 10.7 -Continue Remdesivir IV, Zinc, Vitamin C -Daily CMP, CBC, CRP and d-dimer  Primary hypertension -Continue home Imdur, amlodipine. Irbesartan substituted for home telmisartan while inpatient.  Parkinsonism Neuroleptic induced. Patient follows with neurology as an outpatient. Not currently on treatment.  Hypokalemia Mild. Resolved with repletion.  Hypothyroidism -Continue Synthroid 25 mcg daily  CKD stage IIIa Stable.  Depression -Continue Cymbalta, Abilify  GERD -Continue Protonix  Severe persistent  asthma Stable.  -Continue Flovent, albuterol, Singulair,   BPH Recent history of TURP. -Continue Flomax (substituted for home Rapaflo) and Vibegron  OSA -CPAP qhs  Obesity BMI of 32.93 kg/m on admission.   DVT prophylaxis: Lovenox Code Status:   Code Status: Full Code Family Communication: None at bedside Disposition Plan: Discharge to SNF likely in 2-3 days pending completion of Remdesivir, SNF placement, continued stability   Consultants:  None  Procedures:  Neurology while patient was in the ED  Antimicrobials: Remdesivir    Subjective: No issues overnight. No dyspnea.  Objective: Vitals:   06/02/21 0900 06/02/21 1127 06/02/21 1300 06/02/21 1428  BP: (!) 148/76 (!) 156/86 (!) 159/75 (!) 160/85  Pulse: 90 97 92 90  Resp: (!) 33 (!) 22  18  Temp:  99.4 F (37.4 C)    TempSrc:  Oral    SpO2: 91% 97% 92% 94%    Intake/Output Summary (Last 24 hours) at 06/02/2021 1517 Last data filed at 06/02/2021 0708 Gross per 24 hour  Intake 100 ml  Output --  Net 100 ml   There were no vitals filed for this visit.  Examination:  General exam: Appears calm and comfortable Respiratory system: Diminished but clear. Respiratory effort normal. Cardiovascular system: S1 & S2 heard, RRR. No murmurs, rubs, gallops or clicks. Gastrointestinal system: Abdomen is nondistended, soft and nontender. Normal bowel sounds heard. Central nervous system: Alert and oriented. No focal neurological deficits. Resting tremor of upper extremities Musculoskeletal: No edema. No calf tenderness Skin: No cyanosis. No rashes Psychiatry: Judgement and insight appear normal. Mood & affect appropriate.     Data Reviewed: I have personally reviewed following labs and imaging studies  CBC Lab Results  Component Value Date   WBC 9.7 06/02/2021   RBC 4.41 06/02/2021   HGB 12.2 (L)  06/02/2021   HCT 38.7 (L) 06/02/2021   MCV 87.8 06/02/2021   MCH 27.7 06/02/2021   PLT 263 06/02/2021   MCHC  31.5 06/02/2021   RDW 14.2 06/02/2021   LYMPHSABS 1.3 06/02/2021   MONOABS 1.1 (H) 06/02/2021   EOSABS 0.1 06/02/2021   BASOSABS 0.1 [id number]     Last metabolic panel Lab Results  Component Value Date   NA 143 06/02/2021   K 3.5 06/02/2021   CL 108 06/02/2021   CO2 25 06/02/2021   BUN 7 (L) 06/02/2021   CREATININE 1.24 06/02/2021   GLUCOSE 111 (H) 06/02/2021   GFRNONAA >60 06/02/2021   GFRAA 56 (L) 10/28/2020   CALCIUM 8.9 06/02/2021   PHOS 4.5 11/17/2019   PROT 7.6 06/02/2021   ALBUMIN 3.5 06/02/2021   LABGLOB 3.1 02/02/2021   AGRATIO 1.4 02/02/2021   BILITOT 0.8 06/02/2021   ALKPHOS 64 06/02/2021   AST 19 06/02/2021   ALT 20 06/02/2021   ANIONGAP 10 06/02/2021    CBG (last 3)  No results for input(s): GLUCAP in the last 72 hours.   GFR: Estimated Creatinine Clearance: 57.4 mL/min (by C-G formula based on SCr of 1.24 mg/dL).  Coagulation Profile: No results for input(s): INR, PROTIME in the last 168 hours.  Recent Results (from the past 240 hour(s))  Resp Panel by RT-PCR (Flu A&B, Covid) Nasopharyngeal Swab     Status: Abnormal   Collection Time: 06/01/21 11:12 PM   Specimen: Nasopharyngeal Swab; Nasopharyngeal(NP) swabs in vial transport medium  Result Value Ref Range Status   SARS Coronavirus 2 by RT PCR POSITIVE (A) NEGATIVE Final    Comment: RESULT CALLED TO, READ BACK BY AND VERIFIED WITH: JESSICA LOWDERMILK RN.'@[id number]'$  ON 9.14.22 BY TCALDWELL MT. (NOTE) SARS-CoV-2 target nucleic acids are DETECTED.  The SARS-CoV-2 RNA is generally detectable in upper respiratory specimens during the acute phase of infection. Positive results are indicative of the presence of the identified virus, but do not rule out bacterial infection or co-infection with other pathogens not detected by the test. Clinical correlation with patient history and other diagnostic information is necessary to determine patient infection status. The expected result is Negative.  Fact Sheet  for Patients: EntrepreneurPulse.com.au  Fact Sheet for Healthcare Providers: IncredibleEmployment.be  This test is not yet approved or cleared by the [place] FDA and  has been authorized for detection and/or diagnosis of SARS-CoV-2 by FDA under an Emergency Use Authorization (EUA).  This EUA will remain in effect (me aning this test can be used) for the duration of  the COVID-19 declaration under Section 564(b)(1) of the Act, 21 U.S.C. section 360bbb-3(b)(1), unless the authorization is terminated or revoked sooner.     Influenza A by PCR NEGATIVE NEGATIVE Final   Influenza B by PCR NEGATIVE NEGATIVE Final    Comment: (NOTE) The Xpert Xpress SARS-CoV-2/FLU/RSV plus assay is intended as an aid in the diagnosis of influenza from Nasopharyngeal swab specimens and should not be used as a sole basis for treatment. Nasal washings and aspirates are unacceptable for Xpert Xpress SARS-CoV-2/FLU/RSV testing.  Fact Sheet for Patients: EntrepreneurPulse.com.au  Fact Sheet for Healthcare Providers: IncredibleEmployment.be  This test is not yet approved or cleared by the [place] FDA and has been authorized for detection and/or diagnosis of SARS-CoV-2 by FDA under an Emergency Use Authorization (EUA). This EUA will remain in effect (meaning this test can be used) for the duration of the COVID-19 declaration under Section 564(b)(1) of the Act, 21 U.S.C. section 360bbb-3(b)(1),  unless the authorization is terminated or revoked.  Performed at [facility], [place] [address] [place], [place] [address]         Radiology Studies: CT HEAD WO CONTRAST (5MM)  Result Date: 06/01/2021 CLINICAL DATA:  Head trauma.  Weakness. EXAM: CT HEAD WITHOUT CONTRAST TECHNIQUE: Contiguous axial images were obtained from the base of the skull through the vertex without intravenous contrast. COMPARISON:  None.  FINDINGS: Brain: There is no mass, hemorrhage or extra-axial collection. The appearance of the white matter is normal for the patient's age. There is generalized atrophy. Vascular: No abnormal hyperdensity of the major intracranial arteries or dural venous sinuses. No intracranial atherosclerosis. Skull: The visualized skull base, calvarium and extracranial soft tissues are normal. Sinuses/Orbits: No fluid levels or advanced mucosal thickening of the visualized paranasal sinuses. No mastoid or middle ear effusion. The orbits are normal. IMPRESSION: Generalized atrophy without acute intracranial abnormality. Electronically Signed   By: Ulyses Jarred M.D.   On: 06/01/2021 23:40   DG Chest Portable 1 View  Result Date: 06/01/2021 CLINICAL DATA:  Weakness after colonoscopy this morning, fell out of bed EXAM: PORTABLE CHEST 1 VIEW COMPARISON:  04/02/2020 FINDINGS: Single frontal view of the chest demonstrates an enlarged cardiac silhouette, likely exaggerated by AP portable technique. No airspace disease, effusion, or pneumothorax. No acute bony abnormalities. IMPRESSION: 1. No acute intrathoracic process. Electronically Signed   By: Randa Ngo M.D.   On: 06/01/2021 21:50        Scheduled Meds:  albuterol  2 puff Inhalation Q6H   amLODipine  10 mg Oral Daily   ARIPiprazole  5 mg Oral q morning   vitamin C  500 mg Oral Daily   DULoxetine  60 mg Oral q morning   enoxaparin (LOVENOX) injection  40 mg Subcutaneous Q24H   fluticasone  2 puff Inhalation BID   irbesartan  75 mg Oral Daily   isosorbide mononitrate  30 mg Oral QHS   levothyroxine  25 mcg Oral q morning   montelukast  10 mg Oral QHS   omalizumab  150 mg Subcutaneous Q28 days   pantoprazole  40 mg Oral Daily   tamsulosin  0.4 mg Oral QHS   Vibegron  1 Dose Oral Daily   zinc sulfate  220 mg Oral Daily   Continuous Infusions:  lactated ringers 125 mL/hr at 06/02/21 1123   [START ON 06/03/2021] remdesivir 100 mg in NS 100 mL        LOS: 0 days     Cordelia Poche, MD [facility] 06/02/2021, 3:17 PM  If 7PM-7AM, please contact night-coverage [email or web address]

## 2021-06-03 ENCOUNTER — Telehealth: Payer: Self-pay | Admitting: *Deleted

## 2021-06-03 ENCOUNTER — Telehealth: Payer: Self-pay

## 2021-06-03 LAB — COMPREHENSIVE METABOLIC PANEL
ALT: 20 U/L (ref 0–44)
AST: 16 U/L (ref 15–41)
Albumin: 3.2 g/dL — ABNORMAL LOW (ref 3.5–5.0)
Alkaline Phosphatase: 57 U/L (ref 38–126)
Anion gap: 9 (ref 5–15)
BUN: 8 mg/dL (ref 8–23)
CO2: 23 mmol/L (ref 22–32)
Calcium: 8.5 mg/dL — ABNORMAL LOW (ref 8.9–10.3)
Chloride: 103 mmol/L (ref 98–111)
Creatinine, Ser: 1.34 mg/dL — ABNORMAL HIGH (ref 0.61–1.24)
GFR, Estimated: 56 mL/min — ABNORMAL LOW (ref >60)
Glucose, Bld: 124 mg/dL — ABNORMAL HIGH (ref 70–99)
Potassium: 3.3 mmol/L — ABNORMAL LOW (ref 3.5–5.1)
Sodium: 135 mmol/L (ref 135–145)
Total Bilirubin: 0.7 mg/dL (ref 0.3–1.2)
Total Protein: 7.1 g/dL (ref 6.5–8.1)

## 2021-06-03 LAB — CBC WITH DIFFERENTIAL/PLATELET
Abs Immature Granulocytes: 0.06 10*3/uL (ref 0.00–0.07)
Basophils Absolute: 0.1 10*3/uL (ref 0.0–0.1)
Basophils Relative: 1 %
Eosinophils Absolute: 0.2 10*3/uL (ref 0.0–0.5)
Eosinophils Relative: 2 %
HCT: 36.4 % — ABNORMAL LOW (ref 39.0–52.0)
Hemoglobin: 11.9 g/dL — ABNORMAL LOW (ref 13.0–17.0)
Immature Granulocytes: 1 %
Lymphocytes Relative: 14 %
Lymphs Abs: 1.4 10*3/uL (ref 0.7–4.0)
MCH: 27.5 pg (ref 26.0–34.0)
MCHC: 32.7 g/dL (ref 30.0–36.0)
MCV: 84.1 fL (ref 80.0–100.0)
Monocytes Absolute: 1.6 10*3/uL — ABNORMAL HIGH (ref 0.1–1.0)
Monocytes Relative: 16 %
Neutro Abs: 7 10*3/uL (ref 1.7–7.7)
Neutrophils Relative %: 66 %
Platelets: 271 10*3/uL (ref 150–400)
RBC: 4.33 MIL/uL (ref 4.22–5.81)
RDW: 14.1 % (ref 11.5–15.5)
WBC: 10.3 10*3/uL (ref 4.0–10.5)
nRBC: 0 % (ref 0.0–0.2)

## 2021-06-03 LAB — D-DIMER, QUANTITATIVE: D-Dimer, Quant: 1.3 ug/mL-FEU — ABNORMAL HIGH (ref 0.00–0.50)

## 2021-06-03 LAB — GLUCOSE, CAPILLARY: Glucose-Capillary: 108 mg/dL — ABNORMAL HIGH (ref 70–99)

## 2021-06-03 LAB — C-REACTIVE PROTEIN: CRP: 17.1 mg/dL — ABNORMAL HIGH (ref <1.0)

## 2021-06-03 MED ORDER — POTASSIUM CHLORIDE CRYS ER 20 MEQ PO TBCR
40.0000 meq | EXTENDED_RELEASE_TABLET | Freq: Once | ORAL | Status: AC
  Administered 2021-06-03: 40 meq via ORAL
  Filled 2021-06-03: qty 2

## 2021-06-03 MED ORDER — MENTHOL 3 MG MT LOZG
1.0000 | LOZENGE | OROMUCOSAL | Status: DC | PRN
  Administered 2021-06-03: 3 mg via ORAL
  Filled 2021-06-03: qty 9

## 2021-06-03 NOTE — Telephone Encounter (Signed)
  Follow up Call-  Call back number 06/01/2021  Post procedure Call Back phone  # [phone]  Permission to leave phone message Yes  Some recent data might be hidden     Patient questions:  Do you have a fever, pain , or abdominal swelling? No. Pain Score  0 *  Have you tolerated food without any problems? Yes.    Have you been able to return to your normal activities? Yes.    Do you have any questions about your discharge instructions: Diet   No. Medications  No. Follow up visit  No.  Do you have questions or concerns about your Care? No.  Actions: * If pain score is 4 or above: No action needed, pain <4.  Have you developed a fever since your procedure? no  2.   Have you had an respiratory symptoms (SOB or cough) since your procedure? no  3.   Have you tested positive for COVID 19 since your procedure yes  4.   Have you had any family members/close contacts diagnosed with the COVID 19 since your procedure?  Yes  Pt reports he is currently in the hospital being treated for Covid 19. States he had no symptoms but that his wife tested positive first and then he tested positive and is now in the hospital. States he tested positive on Tuesday 9/13. His procedure was on the same day.    If yes to any of these questions please route to Joylene John, RN and Joella Prince, RN

## 2021-06-03 NOTE — Evaluation (Addendum)
Occupational Therapy Evaluation Patient Details Name: Jared [name] MRN: [id number] DOB: 1951-07-21 Today's Date: 06/03/2021   History of Present Illness 70 year old male with past medical history of cervical spine fusion  , neuroleptic induced parkinsonism, benign prostatic hyperplasia (status post TURP 03/24/2021), hypertension, obstructive sleep apnea, chronic kidney disease stage IIIa, depression, hyperlipidemia, hypothyroidism, gastroesophageal reflux disease and severe persistent asthma, colocnscopy 9/13/.22 who presents to [facility] emergency department  06/01/21 with progressively worsening weakness and fall. Positive for Covid.   Clinical Impression   Mr. Jared [name] is a 70 year old man who presents with generalized weakness and decreased activity tolerance and subjectively reports he is at 50%. Overall patient is min guard to ambulate in room without DME and min assist for ADLs which is a marked improvement from yesterday with PT.  Patient also reports left hand tremor and that he is getting "checked out" for Parkinson's. Patient able to stand at sink for grooming tasks and assist with doffing socks though he reports it has been more difficult. Suspect with continued activity patient will progress well and not have any OT needs at discharge. Patient will benefit from skilled OT services while in hospital to improve deficits and learn compensatory strategies as needed in order to return home at [facility].     Recommendations for follow up therapy are one component of a multi-disciplinary discharge planning process, led by the attending physician.  Recommendations may be updated based on patient status, additional functional criteria and insurance authorization.   Follow Up Recommendations  No OT follow up    Equipment Recommendations  Tub/shower seat    Recommendations for Other Services       Precautions / Restrictions Precautions Precautions: Fall Restrictions Weight  Bearing Restrictions: No      Mobility Bed Mobility Overal bed mobility: Needs Assistance Bed Mobility: Supine to Sit     Supine to sit: Min assist;HOB elevated     General bed mobility comments: min assist to pivot hips with bed pad but otherwise just increased time    Transfers Overall transfer level: Needs assistance Equipment used: None Transfers: Sit to/from Stand           General transfer comment: min guard to stand and ambulate to bathroom, standing for grooming at sink and return to recliner. No overt loss of balance.    Balance Overall balance assessment: History of Falls Sitting-balance support: No upper extremity supported Sitting balance-Leahy Scale: Good     Standing balance support: During functional activity Standing balance-Leahy Scale: Good                             ADL either performed or assessed with clinical judgement   ADL Overall ADL's : Needs assistance/impaired Eating/Feeding: Set up;Sitting   Grooming: Standing;Min guard;Oral care;Wash/dry face;Wash/dry hands Grooming Details (indicate cue type and reason): at sink Upper Body Bathing: Set up;Sitting   Lower Body Bathing: Minimal assistance;Sit to/from stand   Upper Body Dressing : Set up;Sitting   Lower Body Dressing: Minimal assistance;Sit to/from stand Lower Body Dressing Details (indicate cue type and reason): to don socks - reprots increased time and difficulty to perform. Toilet Transfer: Tour manager Toilet;Grab bars;RW   Toileting- Water quality scientist and Hygiene: Min guard;Sit to/from stand   Tub/ Shower Transfer: Min guard   Functional mobility during ADLs: Min guard       Vision Patient Visual Report: No change from baseline  Perception     Praxis      Pertinent Vitals/Pain Pain Assessment: No/denies pain     Hand Dominance Right   Extremity/Trunk Assessment Upper Extremity Assessment Upper Extremity Assessment: Overall WFL for  tasks assessed;RUE deficits/detail;LUE deficits/detail LUE Coordination: decreased fine motor (left hand tremor)   Lower Extremity Assessment Lower Extremity Assessment: Defer to PT evaluation   Cervical / Trunk Assessment Cervical / Trunk Assessment: Normal   Communication Communication Communication: No difficulties   Cognition Arousal/Alertness: Awake/alert Behavior During Therapy: WFL for tasks assessed/performed Overall Cognitive Status: Within Functional Limits for tasks assessed                                     General Comments       Exercises     Shoulder Instructions      Home Living Family/patient expects to be discharged to:: Private residence Living Arrangements: Spouse/significant other Available Help at Discharge: Family;Available 24 hours/day Type of Home: House Home Access: Stairs to enter (2 in the front, 3 in the back, ramp to the sunroon)   Entrance Stairs-Rails: Left Home Layout: One level     Bathroom Shower/Tub: Teacher, early years/pre: Standard     Home Equipment: Environmental consultant - 2 wheels;Cane - single point   Additional Comments: wife + for Covid      Prior Functioning/Environment Level of Independence: Needs assistance  Gait / Transfers Assistance Needed: reports prior to TURP, worked out at gym 3x wk, has gradually declined and used RW recently [employer] Assistance Needed: reports independence with ADLs            OT Problem List: Decreased strength;Decreased activity tolerance;Impaired balance (sitting and/or standing);Decreased knowledge of use of DME or AE      OT Treatment/Interventions: Self-care/ADL training;Therapeutic exercise;DME and/or AE instruction;Therapeutic activities;Balance training;Patient/family education    OT Goals(Current goals can be found in the care plan section) Acute Rehab OT Goals Patient Stated Goal: to return to independence OT Goal Formulation: With patient Time For  Goal Achievement: 06/17/21 Potential to Achieve Goals: Good  OT Frequency: Min 2X/week   Barriers to D/C:            Co-evaluation              AM-PAC OT "6 Clicks" Daily Activity     Outcome Measure Help from another person eating meals?: A Little Help from another person taking care of personal grooming?: A Little Help from another person toileting, which includes using toliet, bedpan, or urinal?: A Little Help from another person bathing (including washing, rinsing, drying)?: A Little Help from another person to put on and taking off regular upper body clothing?: A Little Help from another person to put on and taking off regular lower body clothing?: A Little 6 Click Score: 18   End of Session Nurse Communication: Mobility status  Activity Tolerance: Patient tolerated treatment well Patient left: in chair;with call bell/phone within reach;with chair alarm set  OT Visit Diagnosis: Unsteadiness on feet (R26.81);Muscle weakness (generalized) (M62.81)                Time: [id number] OT Time Calculation (min): 22 min Charges:  OT General Charges $OT Visit: 1 Visit OT Evaluation $OT Eval Low Complexity: 1 Low OT Treatments $Self Care/Home Management : 8-22 mins  Zorina Mallin, OTR/L [place]  Office [phone] Pager: [phone]   Derl Barrow  L Paullette Mckain 06/03/2021, 10:32 AM

## 2021-06-03 NOTE — Progress Notes (Addendum)
PROGRESS NOTE    Jared [name]  [id number] DOB: 1950-11-08 DOA: 06/01/2021 PCP: Burnard Bunting, MD   Brief Narrative: Jared [name] is a 70 y.o. male with a history of cervical spine fusion, neuroleptic induced parkinsonism, BPH s/p TURP, hypertension, OSA, CKD, depression, hyperlipidemia, hypothyroidism, GERD, asthma. Patient presented secondary to progressively worsening weakness and a fall. He was found to be positive for COVID-19. Started on Remdesivir.   Assessment & Plan:   Principal Problem:   COVID-19 virus infection Active Problems:   Mixed hyperlipidemia   GERD without esophagitis   Essential hypertension   Parkinsonism (HCC)   BPH with obstruction/lower urinary tract symptoms   Generalized weakness   Hypokalemia, inadequate intake   Hypothyroidism   Chronic kidney disease, stage 3a (HCC)   Generalized weakness Associated fall. Possibly secondary to COVID-19 infection. In setting of underlying parkinsonism as well. PT consulted and are recommending SNF. TOC consulted for placement. Neurology consulted while patient was in the ED with recommendations to treat infection; if no improvement, re-consult/MRI brain and cervical spine  COVID-19 infection Sick contacts include his wife. Patient reports no other significant symptoms. On room air. Started on Remdesivir on admission. No steroids initiated secondary to lack of hypoxia. CRP of 10.7 > 17.1. -Continue Remdesivir IV, Zinc, Vitamin C -Daily CMP, CBC, CRP and d-dimer  Primary hypertension -Continue home Imdur, amlodipine. Irbesartan substituted for home telmisartan while inpatient.  Parkinsonism Neuroleptic induced. Patient follows with neurology as an outpatient. Not currently on treatment.  Hypokalemia Mild. Resolved with repletion.  Hypothyroidism -Continue Synthroid 25 mcg daily  CKD stage IIIa Baseline creatinine of about 1.3. Stable.  Depression -Continue Cymbalta,  Abilify  GERD -Continue Protonix  Severe persistent asthma Stable.  -Continue Flovent, albuterol, Singulair,   BPH Recent history of TURP. Vibegron (not available while inpatient). -Continue Flomax (substituted for home Rapaflo)  OSA -CPAP qhs  Obesity Body mass index is 31.27 kg/m.   DVT prophylaxis: Lovenox Code Status:   Code Status: Full Code Family Communication: None at bedside. Wife on telephone (6 minutes) Disposition Plan: Discharge Home vs SNF likely in 1-2 days pending completion of Remdesivir, continued PT/OT recommendations   Consultants:  None  Procedures:  Neurology while patient was in the ED  Antimicrobials: Remdesivir    Subjective: Feeling much better today. Feels stronger. Coughing. No dyspnea or chest pain.  Objective: Vitals:   06/03/21 0459 06/03/21 0745 06/03/21 0917 06/03/21 1143  BP: 126/68  110/63 110/69  Pulse: 94   96  Resp: '20 18  18  '$ Temp: 98.3 F (36.8 C)   98.6 F (37 C)  TempSrc: Oral   Oral  SpO2: 94%   95%  Weight:      Height:        Intake/Output Summary (Last 24 hours) at 06/03/2021 1434 Last data filed at 06/03/2021 0500 Gross per 24 hour  Intake 1879.43 ml  Output 975 ml  Net 904.43 ml    Filed Weights   06/02/21 1628  Weight: 93.3 kg    Examination:  General exam: Appears calm and comfortable Respiratory system: Clear to auscultation. Respiratory effort normal. Cardiovascular system: S1 & S2 heard, RRR. No murmurs, rubs, gallops or clicks.  Gastrointestinal system: Abdomen is nondistended, soft and nontender. No organomegaly or masses felt. Normal bowel sounds heard. Central nervous system: Alert and oriented. Resting tremor especially of left upper extremity Musculoskeletal: No edema. No calf tenderness Skin: No cyanosis. No rashes Psychiatry: Judgement and insight appear normal. Mood &  affect appropriate.     Data Reviewed: I have personally reviewed following labs and imaging  studies  CBC Lab Results  Component Value Date   WBC 10.3 06/03/2021   RBC 4.33 06/03/2021   HGB 11.9 (L) 06/03/2021   HCT 36.4 (L) 06/03/2021   MCV 84.1 06/03/2021   MCH 27.5 06/03/2021   PLT 271 06/03/2021   MCHC 32.7 06/03/2021   RDW 14.1 06/03/2021   LYMPHSABS 1.4 06/03/2021   MONOABS 1.6 (H) 06/03/2021   EOSABS 0.2 06/03/2021   BASOSABS 0.1 [id number]     Last metabolic panel Lab Results  Component Value Date   NA 135 06/03/2021   K 3.3 (L) 06/03/2021   CL 103 06/03/2021   CO2 23 06/03/2021   BUN 8 06/03/2021   CREATININE 1.34 (H) 06/03/2021   GLUCOSE 124 (H) 06/03/2021   GFRNONAA 56 (L) 06/03/2021   GFRAA 56 (L) 10/28/2020   CALCIUM 8.5 (L) 06/03/2021   PHOS 4.5 11/17/2019   PROT 7.1 06/03/2021   ALBUMIN 3.2 (L) 06/03/2021   LABGLOB 3.1 02/02/2021   AGRATIO 1.4 02/02/2021   BILITOT 0.7 06/03/2021   ALKPHOS 57 06/03/2021   AST 16 06/03/2021   ALT 20 06/03/2021   ANIONGAP 9 06/03/2021    CBG (last 3)  Recent Labs    06/02/21 1645  GLUCAP 108*      GFR: Estimated Creatinine Clearance: 55.3 mL/min (A) (by C-G formula based on SCr of 1.34 mg/dL (H)).  Coagulation Profile: No results for input(s): INR, PROTIME in the last 168 hours.  Recent Results (from the past 240 hour(s))  Resp Panel by RT-PCR (Flu A&B, Covid) Nasopharyngeal Swab     Status: Abnormal   Collection Time: 06/01/21 11:12 PM   Specimen: Nasopharyngeal Swab; Nasopharyngeal(NP) swabs in vial transport medium  Result Value Ref Range Status   SARS Coronavirus 2 by RT PCR POSITIVE (A) NEGATIVE Final    Comment: RESULT CALLED TO, READ BACK BY AND VERIFIED WITH: JESSICA LOWDERMILK RN.'@[email or web address]'$  ON 9.14.22 BY TCALDWELL MT. (NOTE) SARS-CoV-2 target nucleic acids are DETECTED.  The SARS-CoV-2 RNA is generally detectable in upper respiratory specimens during the acute phase of infection. Positive results are indicative of the presence of the identified virus, but do not rule out bacterial  infection or co-infection with other pathogens not detected by the test. Clinical correlation with patient history and other diagnostic information is necessary to determine patient infection status. The expected result is Negative.  Fact Sheet for Patients: EntrepreneurPulse.com.au  Fact Sheet for Healthcare Providers: IncredibleEmployment.be  This test is not yet approved or cleared by the [place] FDA and  has been authorized for detection and/or diagnosis of SARS-CoV-2 by FDA under an Emergency Use Authorization (EUA).  This EUA will remain in effect (me aning this test can be used) for the duration of  the COVID-19 declaration under Section 564(b)(1) of the Act, 21 U.S.C. section 360bbb-3(b)(1), unless the authorization is terminated or revoked sooner.     Influenza A by PCR NEGATIVE NEGATIVE Final   Influenza B by PCR NEGATIVE NEGATIVE Final    Comment: (NOTE) The Xpert Xpress SARS-CoV-2/FLU/RSV plus assay is intended as an aid in the diagnosis of influenza from Nasopharyngeal swab specimens and should not be used as a sole basis for treatment. Nasal washings and aspirates are unacceptable for Xpert Xpress SARS-CoV-2/FLU/RSV testing.  Fact Sheet for Patients: EntrepreneurPulse.com.au  Fact Sheet for Healthcare Providers: IncredibleEmployment.be  This test is not yet approved or cleared by the [place]  States FDA and has been authorized for detection and/or diagnosis of SARS-CoV-2 by FDA under an Emergency Use Authorization (EUA). This EUA will remain in effect (meaning this test can be used) for the duration of the COVID-19 declaration under Section 564(b)(1) of the Act, 21 U.S.C. section 360bbb-3(b)(1), unless the authorization is terminated or revoked.  Performed at [facility], [place] [address], [place], [place] [address]         Radiology Studies: CT HEAD WO  CONTRAST (5MM)  Result Date: 06/01/2021 CLINICAL DATA:  Head trauma.  Weakness. EXAM: CT HEAD WITHOUT CONTRAST TECHNIQUE: Contiguous axial images were obtained from the base of the skull through the vertex without intravenous contrast. COMPARISON:  None. FINDINGS: Brain: There is no mass, hemorrhage or extra-axial collection. The appearance of the white matter is normal for the patient's age. There is generalized atrophy. Vascular: No abnormal hyperdensity of the major intracranial arteries or dural venous sinuses. No intracranial atherosclerosis. Skull: The visualized skull base, calvarium and extracranial soft tissues are normal. Sinuses/Orbits: No fluid levels or advanced mucosal thickening of the visualized paranasal sinuses. No mastoid or middle ear effusion. The orbits are normal. IMPRESSION: Generalized atrophy without acute intracranial abnormality. Electronically Signed   By: Ulyses Jarred M.D.   On: 06/01/2021 23:40   DG Chest Portable 1 View  Result Date: 06/01/2021 CLINICAL DATA:  Weakness after colonoscopy this morning, fell out of bed EXAM: PORTABLE CHEST 1 VIEW COMPARISON:  04/02/2020 FINDINGS: Single frontal view of the chest demonstrates an enlarged cardiac silhouette, likely exaggerated by AP portable technique. No airspace disease, effusion, or pneumothorax. No acute bony abnormalities. IMPRESSION: 1. No acute intrathoracic process. Electronically Signed   By: Randa Ngo M.D.   On: 06/01/2021 21:50        Scheduled Meds:  albuterol  2 puff Inhalation Q6H   amLODipine  10 mg Oral Daily   ARIPiprazole  5 mg Oral q morning   vitamin C  500 mg Oral Daily   DULoxetine  60 mg Oral q morning   enoxaparin (LOVENOX) injection  40 mg Subcutaneous Q24H   fluticasone  2 puff Inhalation BID   irbesartan  75 mg Oral Daily   isosorbide mononitrate  30 mg Oral QHS   levothyroxine  25 mcg Oral q morning   montelukast  10 mg Oral QHS   pantoprazole  40 mg Oral Daily   potassium chloride   40 mEq Oral Once   tamsulosin  0.4 mg Oral QHS   traZODone  25-50 mg Oral QHS   zinc sulfate  220 mg Oral Daily   Continuous Infusions:  remdesivir 100 mg in NS 100 mL 100 mg (06/03/21 0927)     LOS: 1 day     Cordelia Poche, MD [facility] 06/03/2021, 2:34 PM  If 7PM-7AM, please contact night-coverage [email or web address]

## 2021-06-03 NOTE — Telephone Encounter (Signed)
Called # [phone] and left a message we tried to reach pt for a follow up call. maw

## 2021-06-04 ENCOUNTER — Inpatient Hospital Stay (HOSPITAL_COMMUNITY): Payer: Medicare Other

## 2021-06-04 LAB — CBC WITH DIFFERENTIAL/PLATELET
Abs Immature Granulocytes: 0.06 10*3/uL (ref 0.00–0.07)
Basophils Absolute: 0 10*3/uL (ref 0.0–0.1)
Basophils Relative: 0 %
Eosinophils Absolute: 0.8 10*3/uL — ABNORMAL HIGH (ref 0.0–0.5)
Eosinophils Relative: 8 %
HCT: 34.9 % — ABNORMAL LOW (ref 39.0–52.0)
Hemoglobin: 11.2 g/dL — ABNORMAL LOW (ref 13.0–17.0)
Immature Granulocytes: 1 %
Lymphocytes Relative: 17 %
Lymphs Abs: 1.6 10*3/uL (ref 0.7–4.0)
MCH: 27.4 pg (ref 26.0–34.0)
MCHC: 32.1 g/dL (ref 30.0–36.0)
MCV: 85.3 fL (ref 80.0–100.0)
Monocytes Absolute: 1.4 10*3/uL — ABNORMAL HIGH (ref 0.1–1.0)
Monocytes Relative: 14 %
Neutro Abs: 5.5 10*3/uL (ref 1.7–7.7)
Neutrophils Relative %: 60 %
Platelets: 286 10*3/uL (ref 150–400)
RBC: 4.09 MIL/uL — ABNORMAL LOW (ref 4.22–5.81)
RDW: 14.2 % (ref 11.5–15.5)
WBC: 9.4 10*3/uL (ref 4.0–10.5)
nRBC: 0 % (ref 0.0–0.2)

## 2021-06-04 LAB — C-REACTIVE PROTEIN: CRP: 17.6 mg/dL — ABNORMAL HIGH (ref <1.0)

## 2021-06-04 LAB — COMPREHENSIVE METABOLIC PANEL
ALT: 19 U/L (ref 0–44)
AST: 17 U/L (ref 15–41)
Albumin: 3 g/dL — ABNORMAL LOW (ref 3.5–5.0)
Alkaline Phosphatase: 53 U/L (ref 38–126)
Anion gap: 8 (ref 5–15)
BUN: 15 mg/dL (ref 8–23)
CO2: 23 mmol/L (ref 22–32)
Calcium: 8.2 mg/dL — ABNORMAL LOW (ref 8.9–10.3)
Chloride: 106 mmol/L (ref 98–111)
Creatinine, Ser: 1.46 mg/dL — ABNORMAL HIGH (ref 0.61–1.24)
GFR, Estimated: 51 mL/min — ABNORMAL LOW (ref >60)
Glucose, Bld: 152 mg/dL — ABNORMAL HIGH (ref 70–99)
Potassium: 4 mmol/L (ref 3.5–5.1)
Sodium: 137 mmol/L (ref 135–145)
Total Bilirubin: 0.5 mg/dL (ref 0.3–1.2)
Total Protein: 6.7 g/dL (ref 6.5–8.1)

## 2021-06-04 LAB — D-DIMER, QUANTITATIVE: D-Dimer, Quant: 1.27 ug/mL-FEU — ABNORMAL HIGH (ref 0.00–0.50)

## 2021-06-04 NOTE — Progress Notes (Signed)
Physical Therapy Treatment Patient Details Name: Jared [name] MRN: [id number] DOB: 13-May-1951 Today's Date: 06/04/2021   History of Present Illness 70 year old male with past medical history of cervical spine fusion  , neuroleptic induced parkinsonism, benign prostatic hyperplasia (status post TURP 03/24/2021), hypertension, obstructive sleep apnea, chronic kidney disease stage IIIa, depression, hyperlipidemia, hypothyroidism, gastroesophageal reflux disease and severe persistent asthma, colocnscopy 9/13/.22 who presents to [facility] emergency department  06/01/21 with progressively worsening weakness and fall. Positive for Covid.    PT Comments    The  patient is much improved today.  Patient now reporting right knee and hip pain which was not present yesterday per RN. Gait is antalgic on R , more pronounced without use of Rw. Patient also had 1 loss of balance when right knee slightly buckled and therapist supported patient.  Patient is eager to return home. Recommended to patient to use RW until the knee improves.  Xray of knee negative. The knee is tender to palpate over lateral side. The right hip is tender over greater trochanter.  Continue PT  Recommendations for follow up therapy are one component of a multi-disciplinary discharge planning process, led by the attending physician.  Recommendations may be updated based on patient status, additional functional criteria and insurance authorization.  Follow Up Recommendations  Home health PT     Equipment Recommendations  None recommended by PT    Recommendations for Other Services       Precautions / Restrictions Precautions Precautions: Fall     Mobility  Bed Mobility Overal bed mobility: Modified Independent             General bed mobility comments: use of rails, no asist out .into bed.    Transfers Overall transfer level: Needs assistance Equipment used: Rolling walker (2 wheeled) Transfers: Sit  to/from Stand Sit to Stand: Supervision         General transfer comment: with RW  Ambulation/Gait Ambulation/Gait assistance: Min assist;Min guard Gait Distance (Feet): 50 Feet Assistive device: Rolling walker (2 wheeled);None Gait Pattern/deviations: Antalgic Gait velocity: decr   General Gait Details: ambulated with minguard while using Rw, more support withoput(min assist). patient had 1 balance loss when left knee slightly buckled.   Stairs             Wheelchair Mobility    Modified Rankin (Stroke Patients Only)       Balance Overall balance assessment: History of Falls   Sitting balance-Leahy Scale: Good     Standing balance support: During functional activity;No upper extremity supported Standing balance-Leahy Scale: Poor Standing balance comment: had 1 balance loss without  RW. today has pain in right knee that was not present yesterday which may affect balance                            Cognition Arousal/Alertness: Awake/alert Behavior During Therapy: WFL for tasks assessed/performed Overall Cognitive Status: Within Functional Limits for tasks assessed                                        Exercises      General Comments        Pertinent Vitals/Pain Pain Assessment: Faces Faces Pain Scale: Hurts even more Pain Location: right lateral knee and right hip over gr. troch., more so when palpated. Pain Descriptors / Indicators: Discomfort Pain Intervention(s):  Ice applied;Monitored during session    Home Living                      Prior Function            PT Goals (current goals can now be found in the care plan section) Progress towards PT goals: Progressing toward goals    Frequency    Min 3X/week      PT Plan Discharge plan needs to be updated;Frequency needs to be updated    Co-evaluation              AM-PAC PT "6 Clicks" Mobility   Outcome Measure  Help needed turning from  your back to your side while in a flat bed without using bedrails?: None Help needed moving from lying on your back to sitting on the side of a flat bed without using bedrails?: None Help needed moving to and from a bed to a chair (including a wheelchair)?: A Little Help needed standing up from a chair using your arms (e.g., wheelchair or bedside chair)?: A Little Help needed to walk in hospital room?: A Little Help needed climbing 3-5 steps with a railing? : A Little 6 Click Score: 20    End of Session Equipment Utilized During Treatment: Gait belt Activity Tolerance: Patient tolerated treatment well Patient left: in bed;with call bell/phone within reach;with bed alarm set Nurse Communication: Mobility status PT Visit Diagnosis: Unsteadiness on feet (R26.81);History of falling (Z91.81);Other symptoms and signs involving the nervous system (R29.898)     Time: 1530-1605 PT Time Calculation (min) (ACUTE ONLY): 35 min  Charges:  $Gait Training: 23-37 mins                     Tresa Endo PT Acute Rehabilitation Services Pager [phone] Office [phone]    Claretha Cooper 06/04/2021, 4:31 PM

## 2021-06-04 NOTE — Progress Notes (Addendum)
PROGRESS NOTE    Jared [name]  [id number] DOB: Aug 02, 1951 DOA: 06/01/2021 PCP: Burnard Bunting, MD   Brief Narrative: Jared [name] is a 70 y.o. male with a history of cervical spine fusion, neuroleptic induced parkinsonism, BPH s/p TURP, hypertension, OSA, CKD, depression, hyperlipidemia, hypothyroidism, GERD, asthma. Patient presented secondary to progressively worsening weakness and a fall. He was found to be positive for COVID-19. Started on Remdesivir.   Assessment & Plan:   Principal Problem:   COVID-19 virus infection Active Problems:   Mixed hyperlipidemia   GERD without esophagitis   Essential hypertension   Parkinsonism (HCC)   BPH with obstruction/lower urinary tract symptoms   Generalized weakness   Hypokalemia, inadequate intake   Hypothyroidism   Chronic kidney disease, stage 3a (HCC)   Generalized weakness Associated fall. Possibly secondary to COVID-19 infection. In setting of underlying parkinsonism as well. PT consulted and are recommending SNF. TOC consulted for placement. Neurology consulted while patient was in the ED with recommendations to treat infection; if no improvement, re-consult/MRI brain and cervical spine. Patient has thankfully had good improvement of strength  COVID-19 infection Sick contacts include his wife. Patient reports no other significant symptoms. On room air. Started on Remdesivir on admission. No steroids initiated secondary to lack of hypoxia. CRP of 10.7 > 17.1>17.6. Flat d-dimer. 3 day course of Remdesivir completes today. -Continue Remdesivir IV (last dose today), Zinc, Vitamin C -Daily CMP, CBC, CRP and d-dimer  Right knee pain Significantly tender on exam. No erythema. No trauma history. No history of gout. Unsure of etiology. Limiting ability to mobilize, per patient. PT previously recommending SNF. -Right knee x-ray -PT  Primary hypertension -Continue home Imdur, amlodipine. Irbesartan substituted for home  telmisartan while inpatient.  Parkinsonism Neuroleptic induced. Patient follows with neurology as an outpatient. Not currently on treatment.  Hypokalemia Mild. Resolved with repletion.  Hypothyroidism -Continue Synthroid 25 mcg daily  CKD stage IIIa Baseline creatinine of about 1.3. Stable.  Depression -Continue Cymbalta, Abilify  GERD -Continue Protonix  Severe persistent asthma Stable.  -Continue Flovent, albuterol, Singulair,   BPH Recent history of TURP. Vibegron (not available while inpatient). -Continue Flomax (substituted for home Rapaflo)  OSA -CPAP qhs  Obesity Body mass index is 31.27 kg/m.   DVT prophylaxis: Lovenox Code Status:   Code Status: Full Code Family Communication: None at bedside. Disposition Plan: Discharge Home vs SNF likely in 1-2 days pending continued PT/OT recommendations   Consultants:  None  Procedures:  Neurology while patient was in the ED  Antimicrobials: Remdesivir IV   Subjective: Continues to feel well. Some coughing. No dyspnea. Significant right knee pain is new.   Objective: Vitals:   06/03/21 2131 06/04/21 0508 06/04/21 0700 06/04/21 1037  BP: (!) 124/59 123/65  125/71  Pulse: 93 90  87  Resp: 20 20 (!) 24 18  Temp: 97.9 F (36.6 C) 98.2 F (36.8 C)  98.6 F (37 C)  TempSrc: Oral Oral  Oral  SpO2: 92% 92%  94%  Weight:      Height:        Intake/Output Summary (Last 24 hours) at 06/04/2021 1454 Last data filed at 06/04/2021 1400 Gross per 24 hour  Intake 800 ml  Output 600 ml  Net 200 ml    Filed Weights   06/02/21 1628  Weight: 93.3 kg    Examination:  General exam: Appears calm and comfortable Respiratory system: Clear to auscultation. Respiratory effort normal. Cardiovascular system: S1 & S2 heard, RRR. No  murmurs, rubs, gallops or clicks. Gastrointestinal system: Abdomen is nondistended, soft and nontender. No organomegaly or masses felt. Normal bowel sounds heard. Central nervous  system: Alert. Left arm tremor. Musculoskeletal: No edema. No calf tenderness. Right knee with likely small effusion. Tenderness on lateral/medial aspects of knee. No point tenderness over kneecap.  Skin: No cyanosis. No rashes    Data Reviewed: I have personally reviewed following labs and imaging studies  CBC Lab Results  Component Value Date   WBC 9.4 06/04/2021   RBC 4.09 (L) 06/04/2021   HGB 11.2 (L) 06/04/2021   HCT 34.9 (L) 06/04/2021   MCV 85.3 06/04/2021   MCH 27.4 06/04/2021   PLT 286 06/04/2021   MCHC 32.1 06/04/2021   RDW 14.2 06/04/2021   LYMPHSABS 1.6 06/04/2021   MONOABS 1.4 (H) 06/04/2021   EOSABS 0.8 (H) 06/04/2021   BASOSABS 0.0 [id number]     Last metabolic panel Lab Results  Component Value Date   NA 137 06/04/2021   K 4.0 06/04/2021   CL 106 06/04/2021   CO2 23 06/04/2021   BUN 15 06/04/2021   CREATININE 1.46 (H) 06/04/2021   GLUCOSE 152 (H) 06/04/2021   GFRNONAA 51 (L) 06/04/2021   GFRAA 56 (L) 10/28/2020   CALCIUM 8.2 (L) 06/04/2021   PHOS 4.5 11/17/2019   PROT 6.7 06/04/2021   ALBUMIN 3.0 (L) 06/04/2021   LABGLOB 3.1 02/02/2021   AGRATIO 1.4 02/02/2021   BILITOT 0.5 06/04/2021   ALKPHOS 53 06/04/2021   AST 17 06/04/2021   ALT 19 06/04/2021   ANIONGAP 8 06/04/2021    CBG (last 3)  Recent Labs    06/02/21 1645  GLUCAP 108*      GFR: Estimated Creatinine Clearance: 50.7 mL/min (A) (by C-G formula based on SCr of 1.46 mg/dL (H)).  Coagulation Profile: No results for input(s): INR, PROTIME in the last 168 hours.  Recent Results (from the past 240 hour(s))  Resp Panel by RT-PCR (Flu A&B, Covid) Nasopharyngeal Swab     Status: Abnormal   Collection Time: 06/01/21 11:12 PM   Specimen: Nasopharyngeal Swab; Nasopharyngeal(NP) swabs in vial transport medium  Result Value Ref Range Status   SARS Coronavirus 2 by RT PCR POSITIVE (A) NEGATIVE Final    Comment: RESULT CALLED TO, READ BACK BY AND VERIFIED WITH: JESSICA LOWDERMILK  RN.'@[id number]'$  ON 9.14.22 BY TCALDWELL MT. (NOTE) SARS-CoV-2 target nucleic acids are DETECTED.  The SARS-CoV-2 RNA is generally detectable in upper respiratory specimens during the acute phase of infection. Positive results are indicative of the presence of the identified virus, but do not rule out bacterial infection or co-infection with other pathogens not detected by the test. Clinical correlation with patient history and other diagnostic information is necessary to determine patient infection status. The expected result is Negative.  Fact Sheet for Patients: EntrepreneurPulse.com.au  Fact Sheet for Healthcare Providers: IncredibleEmployment.be  This test is not yet approved or cleared by the [place] FDA and  has been authorized for detection and/or diagnosis of SARS-CoV-2 by FDA under an Emergency Use Authorization (EUA).  This EUA will remain in effect (me aning this test can be used) for the duration of  the COVID-19 declaration under Section 564(b)(1) of the Act, 21 U.S.C. section 360bbb-3(b)(1), unless the authorization is terminated or revoked sooner.     Influenza A by PCR NEGATIVE NEGATIVE Final   Influenza B by PCR NEGATIVE NEGATIVE Final    Comment: (NOTE) The Xpert Xpress SARS-CoV-2/FLU/RSV plus assay is intended as an aid  in the diagnosis of influenza from Nasopharyngeal swab specimens and should not be used as a sole basis for treatment. Nasal washings and aspirates are unacceptable for Xpert Xpress SARS-CoV-2/FLU/RSV testing.  Fact Sheet for Patients: EntrepreneurPulse.com.au  Fact Sheet for Healthcare Providers: IncredibleEmployment.be  This test is not yet approved or cleared by the [place] FDA and has been authorized for detection and/or diagnosis of SARS-CoV-2 by FDA under an Emergency Use Authorization (EUA). This EUA will remain in effect (meaning this test can be used) for  the duration of the COVID-19 declaration under Section 564(b)(1) of the Act, 21 U.S.C. section 360bbb-3(b)(1), unless the authorization is terminated or revoked.  Performed at [facility], [place] [address] [place], [place] [address]         Radiology Studies: No results found.      Scheduled Meds:  albuterol  2 puff Inhalation Q6H   amLODipine  10 mg Oral Daily   ARIPiprazole  5 mg Oral q morning   vitamin C  500 mg Oral Daily   DULoxetine  60 mg Oral q morning   enoxaparin (LOVENOX) injection  40 mg Subcutaneous Q24H   fluticasone  2 puff Inhalation BID   irbesartan  75 mg Oral Daily   isosorbide mononitrate  30 mg Oral QHS   levothyroxine  25 mcg Oral q morning   montelukast  10 mg Oral QHS   pantoprazole  40 mg Oral Daily   tamsulosin  0.4 mg Oral QHS   zinc sulfate  220 mg Oral Daily   Continuous Infusions:     LOS: 2 days     Cordelia Poche, MD [facility] 06/04/2021, 2:54 PM  If 7PM-7AM, please contact night-coverage [email or web address]

## 2021-06-04 NOTE — Plan of Care (Signed)

## 2021-06-05 LAB — CBC WITH DIFFERENTIAL/PLATELET
Abs Immature Granulocytes: 0.08 10*3/uL — ABNORMAL HIGH (ref 0.00–0.07)
Basophils Absolute: 0.1 10*3/uL (ref 0.0–0.1)
Basophils Relative: 1 %
Eosinophils Absolute: 0.6 10*3/uL — ABNORMAL HIGH (ref 0.0–0.5)
Eosinophils Relative: 6 %
HCT: 33.4 % — ABNORMAL LOW (ref 39.0–52.0)
Hemoglobin: 10.8 g/dL — ABNORMAL LOW (ref 13.0–17.0)
Immature Granulocytes: 1 %
Lymphocytes Relative: 14 %
Lymphs Abs: 1.5 10*3/uL (ref 0.7–4.0)
MCH: 27.6 pg (ref 26.0–34.0)
MCHC: 32.3 g/dL (ref 30.0–36.0)
MCV: 85.4 fL (ref 80.0–100.0)
Monocytes Absolute: 1.2 10*3/uL — ABNORMAL HIGH (ref 0.1–1.0)
Monocytes Relative: 11 %
Neutro Abs: 7.5 10*3/uL (ref 1.7–7.7)
Neutrophils Relative %: 67 %
Platelets: 308 10*3/uL (ref 150–400)
RBC: 3.91 MIL/uL — ABNORMAL LOW (ref 4.22–5.81)
RDW: 14.1 % (ref 11.5–15.5)
WBC: 11 10*3/uL — ABNORMAL HIGH (ref 4.0–10.5)
nRBC: 0 % (ref 0.0–0.2)

## 2021-06-05 LAB — COMPREHENSIVE METABOLIC PANEL
ALT: 16 U/L (ref 0–44)
AST: 13 U/L — ABNORMAL LOW (ref 15–41)
Albumin: 3.1 g/dL — ABNORMAL LOW (ref 3.5–5.0)
Alkaline Phosphatase: 51 U/L (ref 38–126)
Anion gap: 8 (ref 5–15)
BUN: 17 mg/dL (ref 8–23)
CO2: 24 mmol/L (ref 22–32)
Calcium: 8.3 mg/dL — ABNORMAL LOW (ref 8.9–10.3)
Chloride: 106 mmol/L (ref 98–111)
Creatinine, Ser: 1.53 mg/dL — ABNORMAL HIGH (ref 0.61–1.24)
GFR, Estimated: 48 mL/min — ABNORMAL LOW (ref >60)
Glucose, Bld: 114 mg/dL — ABNORMAL HIGH (ref 70–99)
Potassium: 3.8 mmol/L (ref 3.5–5.1)
Sodium: 138 mmol/L (ref 135–145)
Total Bilirubin: 0.7 mg/dL (ref 0.3–1.2)
Total Protein: 6.7 g/dL (ref 6.5–8.1)

## 2021-06-05 LAB — METHYLMALONIC ACID, SERUM: Methylmalonic Acid, Quantitative: 158 nmol/L (ref 0–378)

## 2021-06-05 LAB — VITAMIN B1: Vitamin B1 (Thiamine): 144.6 nmol/L (ref 66.5–200.0)

## 2021-06-05 LAB — D-DIMER, QUANTITATIVE: D-Dimer, Quant: 1.63 ug/mL-FEU — ABNORMAL HIGH (ref 0.00–0.50)

## 2021-06-05 LAB — C-REACTIVE PROTEIN: CRP: 18.7 mg/dL — ABNORMAL HIGH (ref <1.0)

## 2021-06-05 NOTE — Discharge Summary (Signed)
Physician Discharge Summary  Jared [name] [id number] DOB: [birth year] DOA: 06/01/2021  PCP: Burnard Bunting, MD  Admit date: 06/01/2021 Discharge date: 06/05/2021  Admitted From: Home Disposition:  Home  Recommendations for Outpatient Follow-up:  Follow up with PCP in 1-2 weeks Please obtain BMP/CBC in one week Please follow up on the following pending results:  Discharge Condition:Stable  CODE STATUS:Full  Diet recommendation: As tolerated   Brief/Interim Summary: Jared [name] is a 70 y.o. male with a history of cervical spine fusion, neuroleptic induced parkinsonism, BPH s/p TURP, hypertension, OSA, CKD, depression, hyperlipidemia, hypothyroidism, GERD, asthma. Patient presented secondary to progressively worsening weakness and a fall. He was found to be positive for COVID-19. Started on Remdesivir - finished with marked clinical improvement.  Generalized weakness Stable for discharge home now given improvement with PT, knee pain resolved   COVID-19 infection Sick contacts include his wife. Patient reports no other significant symptoms. On room air. Started on Remdesivir on admission. No steroids initiated secondary to lack of hypoxia. CRP of 10.7 > 17.1>17.6. Flat d-dimer. 3 day course of Remdesivir completes today. -Completed Remdesivir IV (last dose today), Zinc, Vitamin C -Daily CMP, CBC, CRP and d-dimer   Right knee pain Xray unremarkable - continue PT/OT   Primary hypertension -Continue home meds   Parkinsonism Neuroleptic induced. Patient follows with neurology as an outpatient. Not currently on treatment.   Hypokalemia Mild. Resolved with repletion.   Hypothyroidism -Continue Synthroid 25 mcg daily   CKD stage IIIa Baseline creatinine of about 1.3. Stable.   Depression -Continue Cymbalta, Abilify   GERD -Continue Protonix   Severe persistent asthma Stable.  -Continue Flovent, albuterol, Singulair,    BPH Recent history of TURP. Vibegron  (not available while inpatient). -Continue Flomax (substituted for home Rapaflo)   OSA -CPAP qhs   Obesity Body mass index is 31.27 kg/m.  Discharge Instructions  Discharge Instructions     Diet - low sodium heart healthy   Complete by: As directed    Increase activity slowly   Complete by: As directed       Allergies as of 06/05/2021       Reactions   Contrast Media [iodinated Diagnostic Agents] Other (See Comments)   Was told not to take d/t pt only having 1 kidney   Nsaids Other (See Comments)   Was told not to take d/t pt only having 1 kidney   Penicillins Hives, Itching, Other (See Comments)   Has patient had a PCN reaction causing immediate rash, facial/tongue/throat swelling, SOB or lightheadedness with hypotension: Unknown Has patient had a PCN reaction causing severe rash involving mucus membranes or skin necrosis: Unknown Has patient had a PCN reaction that required hospitalization: Unknown Has patient had a PCN reaction occurring within the last 10 years: No If all of the above answers are "NO", then may proceed with Cephalosporin use.   Sulfonamide Derivatives Hives, Itching   Codeine Nausea Only   Statins Other (See Comments)   Myalgias        Medication List     TAKE these medications    albuterol 108 (90 Base) MCG/ACT inhaler Commonly known as: VENTOLIN HFA Can inhale two puffs every four to six hours as needed for cough, wheeze, shortness of breath, or chest tightness.   amLODipine 10 MG tablet Commonly known as: NORVASC Take 10 mg by mouth daily.   ARIPiprazole 5 MG tablet Commonly known as: ABILIFY Take 5 mg by mouth every morning.   aspirin 325 MG  EC tablet Take 325 mg by mouth daily.   betamethasone dipropionate 0.05 % cream Apply 1 application topically daily as needed (dry skin).   Dulera 200-5 MCG/ACT Aero Generic drug: mometasone-formoterol Inhale 2 puffs into the lungs 2 (two) times daily.   DULoxetine 60 MG  capsule Commonly known as: CYMBALTA Take 60 mg by mouth every morning.   esomeprazole 40 MG capsule Commonly known as: NexIUM Take 1 capsule (40 mg total) by mouth daily at 12 noon.   ezetimibe 10 MG tablet Commonly known as: ZETIA Take 10 mg by mouth daily.   finasteride 5 MG tablet Commonly known as: PROSCAR Take 5 mg by mouth daily.   Fish Oil 500 MG Caps Take 500 mg by mouth daily.   fluticasone 110 MCG/ACT inhaler Commonly known as: FLOVENT HFA 2 puffs twice a day with a spacer to prevent cough or wheeze. Rinse mouth after use.   furosemide 20 MG tablet Commonly known as: LASIX Take 20 mg by mouth daily as needed for edema.   gemfibrozil 600 MG tablet Commonly known as: LOPID Take 600 mg by mouth 2 (two) times daily before a meal.   Gemtesa 75 MG Tabs Generic drug: Vibegron Take 1 Dose by mouth daily.   ipratropium-albuterol 0.5-2.5 (3) MG/3ML Soln Commonly known as: DUONEB Take 3 mLs by nebulization every 6 (six) hours as needed (wheezing, coughing, shortness of breath).   isosorbide mononitrate 30 MG 24 hr tablet Commonly known as: IMDUR take 1 tablet by mouth once daily What changed:  how much to take how to take this when to take this   levothyroxine 25 MCG tablet Commonly known as: SYNTHROID Take 25 mcg by mouth every morning.   montelukast 10 MG tablet Commonly known as: SINGULAIR TAKE 1 TABLET(10 MG) BY MOUTH AT BEDTIME What changed:  how much to take how to take this when to take this additional instructions   multivitamin tablet Take 1 tablet by mouth at bedtime.   optichamber diamond Misc Use as directed with inhaler.   AeroChamber Plus inhaler Use as instructed   potassium chloride 10 MEQ tablet Commonly known as: KLOR-CON Take 10 mEq by mouth daily as needed (low levels).   Praluent 150 MG/ML Soaj Generic drug: Alirocumab Inject 150 mg into the skin every 14 (fourteen) days.   silodosin 8 MG Caps capsule Commonly known as:  RAPAFLO Take 8 mg by mouth at bedtime.   Spiriva Respimat 1.25 MCG/ACT Aers Generic drug: Tiotropium Bromide Monohydrate Inhale 2 puffs into the lungs daily.   telmisartan 20 MG tablet Commonly known as: MICARDIS Take 20 mg by mouth daily.   traZODone 50 MG tablet Commonly known as: DESYREL Take 0.5-1 tablets (25-50 mg total) by mouth at bedtime as needed for sleep.   vitamin C 500 MG tablet Commonly known as: ASCORBIC ACID Take 500 mg by mouth 2 (two) times daily.   VITAMIN D PO Take 50,000 Units by mouth once a week.   Xolair 150 MG injection Generic drug: omalizumab INJECT '150MG'$  SUBCUTANEOUSLY EVERY 4 WEEKS (GIVEN AT MD  OFFICE) What changed: See the new instructions.        Allergies  Allergen Reactions   Contrast Media [Iodinated Diagnostic Agents] Other (See Comments)    Was told not to take d/t pt only having 1 kidney   Nsaids Other (See Comments)    Was told not to take d/t pt only having 1 kidney   Penicillins Hives, Itching and Other (See Comments)  Has patient had a PCN reaction causing immediate rash, facial/tongue/throat swelling, SOB or lightheadedness with hypotension: Unknown Has patient had a PCN reaction causing severe rash involving mucus membranes or skin necrosis: Unknown Has patient had a PCN reaction that required hospitalization: Unknown Has patient had a PCN reaction occurring within the last 10 years: No If all of the above answers are "NO", then may proceed with Cephalosporin use.    Sulfonamide Derivatives Hives and Itching   Codeine Nausea Only   Statins Other (See Comments)    Myalgias    Consultations: None  Procedures/Studies: CT HEAD WO CONTRAST (5MM)  Result Date: 06/01/2021 CLINICAL DATA:  Head trauma.  Weakness. EXAM: CT HEAD WITHOUT CONTRAST TECHNIQUE: Contiguous axial images were obtained from the base of the skull through the vertex without intravenous contrast. COMPARISON:  None. FINDINGS: Brain: There is no mass,  hemorrhage or extra-axial collection. The appearance of the white matter is normal for the patient's age. There is generalized atrophy. Vascular: No abnormal hyperdensity of the major intracranial arteries or dural venous sinuses. No intracranial atherosclerosis. Skull: The visualized skull base, calvarium and extracranial soft tissues are normal. Sinuses/Orbits: No fluid levels or advanced mucosal thickening of the visualized paranasal sinuses. No mastoid or middle ear effusion. The orbits are normal. IMPRESSION: Generalized atrophy without acute intracranial abnormality. Electronically Signed   By: Ulyses Jarred M.D.   On: 06/01/2021 23:40   DG Chest Portable 1 View  Result Date: 06/01/2021 CLINICAL DATA:  Weakness after colonoscopy this morning, fell out of bed EXAM: PORTABLE CHEST 1 VIEW COMPARISON:  04/02/2020 FINDINGS: Single frontal view of the chest demonstrates an enlarged cardiac silhouette, likely exaggerated by AP portable technique. No airspace disease, effusion, or pneumothorax. No acute bony abnormalities. IMPRESSION: 1. No acute intrathoracic process. Electronically Signed   By: Randa Ngo M.D.   On: 06/01/2021 21:50   DG Knee Complete 4 Views Right  Result Date: 06/04/2021 CLINICAL DATA:  Pain.  Giving out.  Prior knee replacement. EXAM: RIGHT KNEE - COMPLETE 4+ VIEW COMPARISON:  08/23/2014 FINDINGS: Right knee arthroplasty. No acute hardware complication or joint effusion. No fracture. IMPRESSION: Expected appearance after right knee arthroplasty. Electronically Signed   By: Abigail Miyamoto M.D.   On: 06/04/2021 14:55     Subjective: No acute issue/events overnight-  knee pain resolved   Discharge Exam: Vitals:   06/04/21 2247 06/05/21 0652  BP: 139/77 120/68  Pulse: 95 89  Resp: 20 16  Temp: 98.1 F (36.7 C) 97.8 F (36.6 C)  SpO2: 91% 93%   Vitals:   06/04/21 1037 06/04/21 1703 06/04/21 2247 06/05/21 0652  BP: 125/71 127/70 139/77 120/68  Pulse: 87  95 89  Resp: 18  (!) '24 20 16  '$ Temp: 98.6 F (37 C) 98.3 F (36.8 C) 98.1 F (36.7 C) 97.8 F (36.6 C)  TempSrc: Oral Oral Oral Oral  SpO2: 94% 92% 91% 93%  Weight:      Height:        General: Pt is alert, awake, not in acute distress Cardiovascular: RRR, S1/S2 +, no rubs, no gallops Respiratory: CTA bilaterally, no wheezing, no rhonchi Abdominal: Soft, NT, ND, bowel sounds + Extremities: no edema, no cyanosis    The results of significant diagnostics from this hospitalization (including imaging, microbiology, ancillary and laboratory) are listed below for reference.     Microbiology: Recent Results (from the past 240 hour(s))  Resp Panel by RT-PCR (Flu A&B, Covid) Nasopharyngeal Swab     Status:  Abnormal   Collection Time: 06/01/21 11:12 PM   Specimen: Nasopharyngeal Swab; Nasopharyngeal(NP) swabs in vial transport medium  Result Value Ref Range Status   SARS Coronavirus 2 by RT PCR POSITIVE (A) NEGATIVE Final    Comment: RESULT CALLED TO, READ BACK BY AND VERIFIED WITH: JESSICA LOWDERMILK RN.'@0209'$  ON 9.14.22 BY TCALDWELL MT. (NOTE) SARS-CoV-2 target nucleic acids are DETECTED.  The SARS-CoV-2 RNA is generally detectable in upper respiratory specimens during the acute phase of infection. Positive results are indicative of the presence of the identified virus, but do not rule out bacterial infection or co-infection with other pathogens not detected by the test. Clinical correlation with patient history and other diagnostic information is necessary to determine patient infection status. The expected result is Negative.  Fact Sheet for Patients: EntrepreneurPulse.com.au  Fact Sheet for Healthcare Providers: IncredibleEmployment.be  This test is not yet approved or cleared by the [place] FDA and  has been authorized for detection and/or diagnosis of SARS-CoV-2 by FDA under an Emergency Use Authorization (EUA).  This EUA will remain in effect  (me aning this test can be used) for the duration of  the COVID-19 declaration under Section 564(b)(1) of the Act, 21 U.S.C. section 360bbb-3(b)(1), unless the authorization is terminated or revoked sooner.     Influenza A by PCR NEGATIVE NEGATIVE Final   Influenza B by PCR NEGATIVE NEGATIVE Final    Comment: (NOTE) The Xpert Xpress SARS-CoV-2/FLU/RSV plus assay is intended as an aid in the diagnosis of influenza from Nasopharyngeal swab specimens and should not be used as a sole basis for treatment. Nasal washings and aspirates are unacceptable for Xpert Xpress SARS-CoV-2/FLU/RSV testing.  Fact Sheet for Patients: EntrepreneurPulse.com.au  Fact Sheet for Healthcare Providers: IncredibleEmployment.be  This test is not yet approved or cleared by the [place] FDA and has been authorized for detection and/or diagnosis of SARS-CoV-2 by FDA under an Emergency Use Authorization (EUA). This EUA will remain in effect (meaning this test can be used) for the duration of the COVID-19 declaration under Section 564(b)(1) of the Act, 21 U.S.C. section 360bbb-3(b)(1), unless the authorization is terminated or revoked.  Performed at [facility], [place] [address] [place], [place] [address]      Labs: BNP (last 3 results) Recent Labs    06/01/21 2053  BNP 123XX123*   Basic Metabolic Panel: Recent Labs  Lab 06/01/21 2053 06/02/21 0554 06/03/21 0329 06/04/21 0324 06/05/21 0330  NA 144 143 135 137 138  K 3.4* 3.5 3.3* 4.0 3.8  CL 109 108 103 106 106  CO2 '24 25 23 23 24  '$ GLUCOSE 108* 111* 124* 152* 114*  BUN 9 7* '8 15 17  '$ CREATININE 1.32* 1.24 1.34* 1.46* 1.53*  CALCIUM 9.0 8.9 8.5* 8.2* 8.3*   Liver Function Tests: Recent Labs  Lab 06/01/21 2053 06/02/21 0554 06/03/21 0329 06/04/21 0324 06/05/21 0330  AST '19 19 16 17 '$ 13*  ALT '22 20 20 19 16  '$ ALKPHOS 68 64 57 53 51  BILITOT 0.7 0.8 0.7 0.5 0.7  PROT 7.9 7.6 7.1  6.7 6.7  ALBUMIN 3.6 3.5 3.2* 3.0* 3.1*   No results for input(s): LIPASE, AMYLASE in the last 168 hours. No results for input(s): AMMONIA in the last 168 hours. CBC: Recent Labs  Lab 06/01/21 2053 06/02/21 0554 06/03/21 0329 06/04/21 0324 06/05/21 0330  WBC 10.3 9.7 10.3 9.4 11.0*  NEUTROABS 7.9* 7.0 7.0 5.5 7.5  HGB 12.3* 12.2* 11.9* 11.2* 10.8*  HCT 38.1* 38.7*  36.4* 34.9* 33.4*  MCV 85.2 87.8 84.1 85.3 85.4  PLT 285 263 271 286 308   Cardiac Enzymes: Recent Labs  Lab 06/01/21 2312  CKTOTAL 91   BNP: Invalid input(s): POCBNP CBG: Recent Labs  Lab 06/02/21 1645  GLUCAP 108*   D-Dimer Recent Labs    06/04/21 0324 06/05/21 0330  DDIMER 1.27* 1.63*   Hgb A1c No results for input(s): HGBA1C in the last 72 hours. Lipid Profile No results for input(s): CHOL, HDL, LDLCALC, TRIG, CHOLHDL, LDLDIRECT in the last 72 hours. Thyroid function studies No results for input(s): TSH, T4TOTAL, T3FREE, THYROIDAB in the last 72 hours.  Invalid input(s): FREET3 Anemia work up No results for input(s): VITAMINB12, FOLATE, FERRITIN, TIBC, IRON, RETICCTPCT in the last 72 hours. Urinalysis    Component Value Date/Time   COLORURINE YELLOW 06/01/2021 2054   APPEARANCEUR CLEAR 06/01/2021 2054   LABSPEC 1.010 06/01/2021 2054   PHURINE 6.0 06/01/2021 2054   GLUCOSEU NEGATIVE 06/01/2021 2054   HGBUR TRACE (A) 06/01/2021 2054   BILIRUBINUR NEGATIVE 06/01/2021 2054   KETONESUR NEGATIVE 06/01/2021 2054   PROTEINUR NEGATIVE 06/01/2021 2054   UROBILINOGEN 0.2 03/17/2010 1452   NITRITE NEGATIVE 06/01/2021 2054   LEUKOCYTESUR TRACE (A) 06/01/2021 2054   Sepsis Labs Invalid input(s): PROCALCITONIN,  WBC,  LACTICIDVEN Microbiology Recent Results (from the past 240 hour(s))  Resp Panel by RT-PCR (Flu A&B, Covid) Nasopharyngeal Swab     Status: Abnormal   Collection Time: 06/01/21 11:12 PM   Specimen: Nasopharyngeal Swab; Nasopharyngeal(NP) swabs in vial transport medium  Result Value  Ref Range Status   SARS Coronavirus 2 by RT PCR POSITIVE (A) NEGATIVE Final    Comment: RESULT CALLED TO, READ BACK BY AND VERIFIED WITH: JESSICA LOWDERMILK RN.'@0209'$  ON 9.14.22 BY TCALDWELL MT. (NOTE) SARS-CoV-2 target nucleic acids are DETECTED.  The SARS-CoV-2 RNA is generally detectable in upper respiratory specimens during the acute phase of infection. Positive results are indicative of the presence of the identified virus, but do not rule out bacterial infection or co-infection with other pathogens not detected by the test. Clinical correlation with patient history and other diagnostic information is necessary to determine patient infection status. The expected result is Negative.  Fact Sheet for Patients: EntrepreneurPulse.com.au  Fact Sheet for Healthcare Providers: IncredibleEmployment.be  This test is not yet approved or cleared by the [place] FDA and  has been authorized for detection and/or diagnosis of SARS-CoV-2 by FDA under an Emergency Use Authorization (EUA).  This EUA will remain in effect (me aning this test can be used) for the duration of  the COVID-19 declaration under Section 564(b)(1) of the Act, 21 U.S.C. section 360bbb-3(b)(1), unless the authorization is terminated or revoked sooner.     Influenza A by PCR NEGATIVE NEGATIVE Final   Influenza B by PCR NEGATIVE NEGATIVE Final    Comment: (NOTE) The Xpert Xpress SARS-CoV-2/FLU/RSV plus assay is intended as an aid in the diagnosis of influenza from Nasopharyngeal swab specimens and should not be used as a sole basis for treatment. Nasal washings and aspirates are unacceptable for Xpert Xpress SARS-CoV-2/FLU/RSV testing.  Fact Sheet for Patients: EntrepreneurPulse.com.au  Fact Sheet for Healthcare Providers: IncredibleEmployment.be  This test is not yet approved or cleared by the [place] FDA and has been authorized for  detection and/or diagnosis of SARS-CoV-2 by FDA under an Emergency Use Authorization (EUA). This EUA will remain in effect (meaning this test can be used) for the duration of the COVID-19 declaration under Section 564(b)(1) of  the Act, 21 U.S.C. section 360bbb-3(b)(1), unless the authorization is terminated or revoked.  Performed at [facility], [place] [address] [place], [place] [address]      Time coordinating discharge: Over 30 minutes  SIGNED:   Little Ishikawa, DO [facility] 06/05/2021, 10:34 AM Pager   If 7PM-7AM, please contact night-coverage [email or web address]

## 2021-06-05 NOTE — Progress Notes (Signed)
Physical Therapy Treatment Patient Details Name: Jared [name] MRN: [id number] DOB: [birth year] Today's Date: 06/05/2021   History of Present Illness 70 year old male with past medical history of cervical spine fusion  , neuroleptic induced parkinsonism, benign prostatic hyperplasia (status post TURP 03/24/2021), hypertension, obstructive sleep apnea, chronic kidney disease stage IIIa, depression, hyperlipidemia, hypothyroidism, gastroesophageal reflux disease and severe persistent asthma, colocnscopy 9/13/.22 who presents to [facility] emergency department  06/01/21 with progressively worsening weakness and fall. Positive for Covid.    PT Comments    Today , patient has no right leg pain. Gait without RW steady, not antalgic, no loss of balance. Patient hopeful to Dc home soon.    Recommendations for follow up therapy are one component of a multi-disciplinary discharge planning process, led by the attending physician.  Recommendations may be updated based on patient status, additional functional criteria and insurance authorization.  Follow Up Recommendations  Home health PT     Equipment Recommendations     None-Has RW  Recommendations for Other Services       Precautions / Restrictions Precautions Precautions: Fall     Mobility  Bed Mobility Overal bed mobility: Modified Independent             General bed mobility comments: use of rails, no assistance, extra time, moves slowly.    Transfers Overall transfer level: Modified independent Equipment used: None             General transfer comment: no Device  Ambulation/Gait Ambulation/Gait assistance: Supervision Gait Distance (Feet): 60 Feet Assistive device: None Gait Pattern/deviations: Step-through pattern Gait velocity: decr   General Gait Details: not antalgic today, much improved for balance.   Stairs             Wheelchair Mobility    Modified Rankin (Stroke Patients Only)        Balance Overall balance assessment: Mild deficits observed, not formally tested                                          Cognition   Behavior During Therapy: [facility] for tasks assessed/performed Overall Cognitive Status: Within Functional Limits for tasks assessed                                        Exercises      General Comments        Pertinent Vitals/Pain Pain Assessment: No/denies pain    Home Living                      Prior Function            PT Goals (current goals can now be found in the care plan section) Progress towards PT goals: Progressing toward goals    Frequency    Min 3X/week      PT Plan Current plan remains appropriate    Co-evaluation              AM-PAC PT "6 Clicks" Mobility   Outcome Measure  Help needed turning from your back to your side while in a flat bed without using bedrails?: None Help needed moving from lying on your back to sitting on the side of a flat bed without using bedrails?: None Help needed moving to  and from a bed to a chair (including a wheelchair)?: None Help needed standing up from a chair using your arms (e.g., wheelchair or bedside chair)?: None Help needed to walk in hospital room?: A Little Help needed climbing 3-5 steps with a railing? : A Little 6 Click Score: 22    End of Session Equipment Utilized During Treatment: Gait belt Activity Tolerance: Patient tolerated treatment well Patient left: in chair;with call bell/phone within reach;with chair alarm set Nurse Communication: Mobility status       Time: [id number] PT Time Calculation (min) (ACUTE ONLY): 14 min  Charges:  $Gait Training: 8-22 mins                      [place] Pager [phone] Office [phone]    Claretha Cooper 06/05/2021, 9:00 AM

## 2021-06-05 NOTE — Progress Notes (Signed)
Occupational Therapy Treatment Patient Details Name: Jared [name] MRN: [id number] DOB: 09-22-1950 Today's Date: 06/05/2021   History of present illness 70 year old male with past medical history of cervical spine fusion  , neuroleptic induced parkinsonism, benign prostatic hyperplasia (status post TURP 03/24/2021), hypertension, obstructive sleep apnea, chronic kidney disease stage IIIa, depression, hyperlipidemia, hypothyroidism, gastroesophageal reflux disease and severe persistent asthma, colocnscopy 9/13/.22 who presents to [facility] emergency department  06/01/21 with progressively worsening weakness and fall. Positive for Covid.   OT comments  Patient was educated on using AE for LB dressing tasks. Patient was able to don socks with sock aid with min cues to increase independence in ADLs. Patient is scheduled to d/c today. Patient to speak with wife about getting sock aid. Patient's discharge plan remains appropriate at this time. OT will continue to follow acutely.     Recommendations for follow up therapy are one component of a multi-disciplinary discharge planning process, led by the attending physician.  Recommendations may be updated based on patient status, additional functional criteria and insurance authorization.    Follow Up Recommendations  No OT follow up    Equipment Recommendations       Recommendations for Other Services      Precautions / Restrictions Precautions Precautions: Fall Restrictions Weight Bearing Restrictions: No       Mobility Bed Mobility               General bed mobility comments: patient was seated in recliner at start of session    Transfers     Transfers: Sit to/from Stand Sit to Stand: Supervision         General transfer comment: no Device    Balance Overall balance assessment: Mild deficits observed, not formally tested                                         ADL either performed or  assessed with clinical judgement   ADL Overall ADL's : Needs assistance/impaired                     Lower Body Dressing: Minimal assistance;Sit to/from stand Lower Body Dressing Details (indicate cue type and reason): patient needed min A to don shoes with increased time and education on using long handled shoe horn. patient require dmin A to clear L foot into pants with increased tremors with activity. patient was educated on using sock aid with min cues to process task to use appropirately. patient was able to doff socks with increased time with sliding foot out of sock with use of opposite foot. patient to talk with wife about sock aid use.                     Vision Patient Visual Report: No change from baseline     Perception     Praxis      Cognition Arousal/Alertness: Awake/alert Behavior During Therapy: WFL for tasks assessed/performed Overall Cognitive Status: Within Functional Limits for tasks assessed                                          Exercises     Shoulder Instructions       General Comments      Pertinent Vitals/ Pain  Pain Assessment: No/denies pain  Home Living                                          Prior Functioning/Environment              Frequency  Min 2X/week        Progress Toward Goals  OT Goals(current goals can now be found in the care plan section)  Progress towards OT goals: Progressing toward goals  Acute Rehab OT Goals Patient Stated Goal: to return to independence  Plan Discharge plan remains appropriate    Co-evaluation                 AM-PAC OT "6 Clicks" Daily Activity     Outcome Measure   Help from another person eating meals?: A Little Help from another person taking care of personal grooming?: A Little Help from another person toileting, which includes using toliet, bedpan, or urinal?: A Little Help from another person bathing (including  washing, rinsing, drying)?: A Little Help from another person to put on and taking off regular upper body clothing?: A Little Help from another person to put on and taking off regular lower body clothing?: A Little 6 Click Score: 18    End of Session    OT Visit Diagnosis: Unsteadiness on feet (R26.81);Muscle weakness (generalized) (M62.81)   Activity Tolerance Patient tolerated treatment well   Patient Left in chair;with call bell/phone within reach;with nursing/sitter in room (nurse tech in room)   Nurse Communication Mobility status        Time: [id number] OT Time Calculation (min): 23 min  Charges: OT General Charges $OT Visit: 1 Visit OT Treatments $Self Care/Home Management : 23-37 mins  Jackelyn Poling OTR/L, MS Acute Rehabilitation Department Office# [phone] Pager# [phone]   [place] 06/05/2021, 2:03 PM

## 2021-06-07 ENCOUNTER — Encounter: Payer: Self-pay | Admitting: Gastroenterology

## 2021-06-08 ENCOUNTER — Encounter (HOSPITAL_COMMUNITY): Payer: Medicare Other

## 2021-06-08 ENCOUNTER — Ambulatory Visit (HOSPITAL_COMMUNITY): Admission: RE | Admit: 2021-06-08 | Payer: Medicare Other | Source: Ambulatory Visit

## 2021-06-16 ENCOUNTER — Telehealth: Payer: Self-pay | Admitting: Family Medicine
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
## 2021-06-16 NOTE — Telephone Encounter (Signed)
Called and spoke with pt again. Advised I was reviewing chart again. We spoke back on 04/28/21 about how Dr. Brett Fairy was not sure what other options to provide and wanted to refer him to a cognitive behavioral specialist. However, after looking at the referral that was placed, there is a note stating "We do not have any cognitive behavioral therapist in this office. Please refer to a specialist" . Advised we will work on finding an alternative location and reach back out if needed. He verbalized understanding.   He has DAT scan scheduled for 07/13/21.

## 2021-06-16 NOTE — Telephone Encounter (Signed)
Pt called states the traZODone (DESYREL) 50 MG tablet is not helping him sleep. Pt requesting a call back.

## 2021-06-21 ENCOUNTER — Telehealth: Payer: Self-pay | Admitting: Gastroenterology

## 2021-06-21 NOTE — Telephone Encounter (Signed)
Lm on vm for patient to return call 

## 2021-06-21 NOTE — Telephone Encounter (Signed)
Inbound call from patient. States he had his procedure 9/13 and 10/2 was the first time he was able to have a bowel movement. States it was small and hard. Wants to know if this is normal. Best contact number [phone]

## 2021-06-21 NOTE — Telephone Encounter (Signed)
Spoke with patient, he states that he has been passing small hard stools since his procedure. Denies any abdominal pain. Not taking any iron supplements. Pt states that his diet has been regular. He has a hx of constipation. Advised patient that he can try taking Miralax 1-2 times a day to see if that helps get his bowels moving. Advised patient to make sure he is drinking plenty of water. Pt advised to call back if no improvement after trying this for several days. Pt verbalized understanding and had no concerns at the end of the call.

## 2021-06-22 ENCOUNTER — Ambulatory Visit: Payer: Medicare Other

## 2021-06-23 NOTE — Telephone Encounter (Signed)
Referral has been resent to [facility]. Phone:  [phone].

## 2021-07-12 NOTE — Telephone Encounter (Signed)
Updated [place]: [id number] (exp. 07/12/21 to 08/26/21) patient is scheduled for 07/13/21.

## 2021-07-13 ENCOUNTER — Ambulatory Visit (HOSPITAL_COMMUNITY)
Admission: RE | Admit: 2021-07-13 | Discharge: 2021-07-13 | Disposition: A | Discharge status: Home / Self Care | Payer: Medicare Other | Source: Ambulatory Visit | Attending: Family Medicine | Admitting: Family Medicine

## 2021-07-13 ENCOUNTER — Encounter (HOSPITAL_COMMUNITY): Payer: Medicare Other

## 2021-07-13 ENCOUNTER — Other Ambulatory Visit: Payer: Self-pay

## 2021-07-13 ENCOUNTER — Ambulatory Visit (INDEPENDENT_AMBULATORY_CARE_PROVIDER_SITE_OTHER): Payer: Medicare Other | Admitting: *Deleted

## 2021-07-13 DIAGNOSIS — R259 Unspecified abnormal involuntary movements: Secondary | ICD-10-CM | POA: Diagnosis present

## 2021-07-13 DIAGNOSIS — J4550 Severe persistent asthma, uncomplicated: Secondary | ICD-10-CM

## 2021-07-13 MED ORDER — POTASSIUM IODIDE (ANTIDOTE) 130 MG PO TABS
130.0000 mg | ORAL_TABLET | Freq: Once | ORAL | Status: AC
  Administered 2021-07-13: 130 mg via ORAL

## 2021-07-13 MED ORDER — POTASSIUM IODIDE (ANTIDOTE) 130 MG PO TABS
ORAL_TABLET | ORAL | Status: AC
  Filled 2021-07-13: qty 1

## 2021-07-13 MED ORDER — IOFLUPANE I 123 185 MBQ/2.5ML IV SOLN
4.8000 | Freq: Once | INTRAVENOUS | Status: AC | PRN
  Administered 2021-07-13: 4.8 via INTRAVENOUS
  Filled 2021-07-13: qty 5

## 2021-07-15 ENCOUNTER — Telehealth: Payer: Self-pay | Admitting: Neurology

## 2021-07-15 NOTE — Telephone Encounter (Signed)
Called the patient and advised of the Dat Scan results. Advised the patient that [name] would like to have him see Dr Brett Fairy when we can get him worked in to address concerns r/t worsening tremor. Pt verbalized understanding. I was able to schedule the pt for 10/13/2021 at 8:30 am and will call the patient if a cancellation becomes available.

## 2021-07-15 NOTE — Telephone Encounter (Signed)
-----   Message from Debbora Presto, NP sent at 07/14/2021  7:58 AM EDT ----- Can you guys please let him know that the DaT scan results were normal. There was no evidence of a Parkinsonian syndrome. I would like to get him back in with Dr Dohmeier at first available to discuss treatment plan moving forward since he is having worsening symptoms despite medication changes.

## 2021-08-04 ENCOUNTER — Ambulatory Visit: Payer: Self-pay

## 2021-08-09 ENCOUNTER — Other Ambulatory Visit: Payer: Self-pay | Admitting: Family Medicine

## 2021-08-09 ENCOUNTER — Ambulatory Visit: Payer: Medicare Other

## 2021-08-24 ENCOUNTER — Encounter: Payer: Self-pay | Admitting: Cardiovascular Disease

## 2021-08-24 ENCOUNTER — Other Ambulatory Visit: Payer: Self-pay

## 2021-08-24 ENCOUNTER — Ambulatory Visit (INDEPENDENT_AMBULATORY_CARE_PROVIDER_SITE_OTHER): Payer: Medicare Other | Admitting: Cardiovascular Disease

## 2021-08-24 VITALS — BP 114/74 | HR 69 | Ht 67.0 in | Wt 207.0 lb

## 2021-08-24 DIAGNOSIS — E7800 Pure hypercholesterolemia, unspecified: Secondary | ICD-10-CM

## 2021-08-24 DIAGNOSIS — G4733 Obstructive sleep apnea (adult) (pediatric): Secondary | ICD-10-CM

## 2021-08-24 DIAGNOSIS — I10 Essential (primary) hypertension: Secondary | ICD-10-CM

## 2021-08-24 DIAGNOSIS — Z9989 Dependence on other enabling machines and devices: Secondary | ICD-10-CM

## 2021-08-24 DIAGNOSIS — I2510 Atherosclerotic heart disease of native coronary artery without angina pectoris: Secondary | ICD-10-CM

## 2021-08-24 DIAGNOSIS — R0989 Other specified symptoms and signs involving the circulatory and respiratory systems: Secondary | ICD-10-CM | POA: Diagnosis not present

## 2021-08-24 DIAGNOSIS — E782 Mixed hyperlipidemia: Secondary | ICD-10-CM
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
## 2021-08-24 NOTE — Patient Instructions (Signed)
Medication Instructions:  Your physician recommends that you continue on your current medications as directed. Please refer to the Current Medication list given to you today.  *If you need a refill on your cardiac medications before your next appointment, please call your pharmacy*   Testing/Procedures: Your physician has requested that you have a carotid duplex. This test is an ultrasound of the carotid arteries in your neck. It looks at blood flow through these arteries that supply the brain with blood. Allow one hour for this exam. There are no restrictions or special instructions. This procedure will be done at [place].   Follow-Up: At [facility], you and your health needs are our priority.  As part of our continuing mission to provide you with exceptional heart care, we have created designated Provider Care Teams.  These Care Teams include your primary Cardiologist (physician) and Advanced Practice Providers (APPs -  Physician Assistants and Nurse Practitioners) who all work together to provide you with the care you need, when you need it.  We recommend signing up for the patient portal called "MyChart".  Sign up information is provided on this After Visit Summary.  MyChart is used to connect with patients for Virtual Visits (Telemedicine).  Patients are able to view lab/test results, encounter notes, upcoming appointments, etc.  Non-urgent messages can be sent to your provider as well.   To learn more about what you can do with MyChart, go to [email or web address].    Your next appointment:   12 month(s)  The format for your next appointment:   In Person  Provider:   Quay Burow, MD

## 2021-08-24 NOTE — Assessment & Plan Note (Signed)
History of essential hypertension a blood pressure measured today 114/74.  He is on amlodipine.

## 2021-08-24 NOTE — Assessment & Plan Note (Addendum)
History of hyperlipidemia on Praluent and Zetia, with lipid profile performed 05/14/2021 revealing total cholesterol of 152, LDL 77 and HDL 36.

## 2021-08-24 NOTE — Assessment & Plan Note (Signed)
History of CAD status post RCA stenting by Dr. Rex Kras in 2000.  He was cath back in 2011 revealing a widely patent RCA stent with 30% distal left main stenosis and normal LV function.  He is remained completely asymptomatic.

## 2021-09-01 ENCOUNTER — Other Ambulatory Visit: Payer: Self-pay | Admitting: Cardiovascular Disease

## 2021-09-01 ENCOUNTER — Ambulatory Visit (HOSPITAL_COMMUNITY)
Admission: RE | Admit: 2021-09-01 | Discharge: 2021-09-01 | Disposition: A | Discharge status: Home / Self Care | Payer: Medicare Other | Source: Ambulatory Visit | Attending: Cardiology | Admitting: Cardiology

## 2021-09-01 ENCOUNTER — Other Ambulatory Visit: Payer: Self-pay

## 2021-09-01 ENCOUNTER — Other Ambulatory Visit (HOSPITAL_COMMUNITY): Payer: Self-pay | Admitting: Cardiovascular Disease

## 2021-09-01 DIAGNOSIS — I6523 Occlusion and stenosis of bilateral carotid arteries: Secondary | ICD-10-CM

## 2021-09-01 DIAGNOSIS — R0989 Other specified symptoms and signs involving the circulatory and respiratory systems: Secondary | ICD-10-CM | POA: Diagnosis not present

## 2021-09-06 ENCOUNTER — Telehealth: Payer: Self-pay | Admitting: Cardiovascular Disease

## 2021-09-06 NOTE — Telephone Encounter (Signed)
Left message for pt to call back  °

## 2021-09-06 NOTE — Telephone Encounter (Signed)
Patient made aware of results and verbalized understanding. Repeat orders placed.  Per Dr. Gwenlyn Found, Moderate right ICA stenosis.  Repeat 12 months

## 2021-09-06 NOTE — Telephone Encounter (Signed)
Pt is returning call.  

## 2021-09-06 NOTE — Telephone Encounter (Signed)
Patient is requesting a call back to go over his carotid results.

## 2021-09-08 ENCOUNTER — Encounter (HOSPITAL_COMMUNITY): Payer: Medicare Other

## 2021-09-20 ENCOUNTER — Other Ambulatory Visit: Payer: Self-pay

## 2021-09-20 ENCOUNTER — Ambulatory Visit (INDEPENDENT_AMBULATORY_CARE_PROVIDER_SITE_OTHER): Payer: Medicare PPO

## 2021-09-20 DIAGNOSIS — J4550 Severe persistent asthma, uncomplicated: Secondary | ICD-10-CM | POA: Diagnosis not present

## 2021-09-28 NOTE — Progress Notes (Signed)
error 

## 2021-10-13 ENCOUNTER — Ambulatory Visit: Payer: Medicare PPO | Admitting: Neurology

## 2021-10-13 ENCOUNTER — Encounter: Payer: Self-pay | Admitting: Neurology

## 2021-10-13 VITALS — BP 132/73 | HR 82 | Ht 67.5 in | Wt 204.0 lb

## 2021-10-13 DIAGNOSIS — F5105 Insomnia due to other mental disorder: Secondary | ICD-10-CM | POA: Diagnosis not present

## 2021-10-13 DIAGNOSIS — Z9989 Dependence on other enabling machines and devices: Secondary | ICD-10-CM | POA: Diagnosis not present

## 2021-10-13 DIAGNOSIS — R259 Unspecified abnormal involuntary movements: Secondary | ICD-10-CM

## 2021-10-13 DIAGNOSIS — G212 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: Secondary | ICD-10-CM

## 2021-10-13 DIAGNOSIS — G4733 Obstructive sleep apnea (adult) (pediatric): Secondary | ICD-10-CM | POA: Diagnosis not present

## 2021-10-13 MED ORDER — TRAZODONE HCL 50 MG PO TABS: 50.0000 mg | ORAL_TABLET | Freq: Every evening | ORAL | 5 refills | Status: DC | PRN

## 2021-10-13 NOTE — Progress Notes (Signed)
SLEEP MEDICINE CLINIC    Provider:  Larey Seat, MD  Primary Care Physician:  Burnard Bunting, MD [place] [address]     Referring Provider: Dr Floyde Parkins,          Chief Complaint according to patient   Patient presents with:     New Patient (Initial Visit)           HISTORY OF PRESENT ILLNESS:  Jared [name] is a 71 y.o. year old Caucasian male patient seen for a sleep consultation- the patient's primary neurologist was Dr Jannifer Franklin.  Former Patient of Dr Floyde Parkins, MD, who is now transferring care for his movement disorder:  Chief concern according to patient :  I am getting more and more fatigued, CPAP machine was 73 years old in 2020 , he underwent a Sleep test 08-28-2019 and was diagnosed with REM sleep prominent apnea, severe. Has been on CPAP since. He has contracted COVID 19 twice , first in 2021 and right around the first vaccination- and then the second time in 05-2021, severe illness, pneumonia and hospitalization. Was on oxygen , but not intubated;     [name]  is seen here today on 10-13-2021 and a transfer of his general neurology care.  Formally treated by Dr. Floyde Parkins he has developed left dominant tremors, masked face, slightly dysphonic voice.   [name] Lomax followed him in summer 2022 and evaluated his parkinsonian features further; she ordered a DaTscan.  This was done at [facility] only on 07-13-2021, after Dr Jannifer Franklin retirement.  There was no reduced radiotracer activity in the basal ganglia seen nothing to suggest Parkinson syndrome pathology.  Symmetric uptake was seen within the left and right striata.  No evidence of loss of dopamine transport. The patient also does not experience restless legs and does not and act his dreams as known to be a precursor of Parkinson syndrome.  REM sleep behavior disorder has never been his problem.  He has been followed in our sleep clinic and this is what I have seen him for in  the past he was diagnosed twice with severe sleep apnea and REM sleep dependent and he is 80% compliance with his AutoSet CPAP at the current time.  He uses a pressure between 6 and 16 cmH2O without expiratory pressure relief.  He does have moderate air leakage.  His residual AHI is 1.4 which speaks for complete resolution.  He does not have Cheyne-Stokes respirations.  His 95th percentile pressure is 10 cm water.  His Epworth Sleepiness Scale has been reduced to 3 out of 24 points so there is no excessive daytime sleepiness I did not see the fatigue severity scale to be filled.  The geriatric depression score was not obtained.  Medication induced parkinsonism? Why would it be asymmetric? Which medications did the patient take that would have reduced dopamine receptors.  He is on Abilify, and trazodone  and melatonin-and Cymbalta ( for depression , prescriber Dr. [name]) . He reports looking at the clock every hour , and not sleeping.  His mind is racing. I discussed sleep hygiene. He has anxiety related insomnia, sleep initiation insomnia ,this is not an organic sleep disorder.     Before my consult 08-2019 with the patient: Patient started treatment of severe depression with Cymbalta and he reportedly feels better and sleeps better when taking Hydralazine and Belsomra ( in therapy with Metta Clines). Patient reports headaches have improved, sleepiness is progressing.  08-28-2019; Jared [name] is a right-handed Caucasian male with a medical history of Cervical vertebral fusion (05/02/2014), Coronary artery disease,  MI ([place]) (2009), Stent placed in 2009. 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     Summary & Diagnosis:      The patients AHI ( apnea -hypopnea Index) indicates severe sleep apnea at 39.4/h and was further exacerbated by REM sleep to an AHI of 62.4/h. Moderate snoring was recorded and mild hypoxemia.  Patient reports headaches have improved, sleepiness is progressing.  Metta Clines. She maintains the patient on Cymbalta.    Dr. Jannifer Franklin:  08-10-2019 History of present illness: Mr. Jared [name] is a 71 year old right-handed white male with a history of renal cell carcinoma in the past, history of coronary artery disease, asthma, sleep apnea on CPAP, and history of depression on medications to include Abilify.  The patient claims that in early August he began having headaches in the right frontotemporal region.  He has never had any problems with headaches at any time in his life, headaches are very unusual for him.  The patient has noted that the headaches may last 30 minutes to an hour and a half, and may come and go up to 3 times a day, sometimes waking him up out of sleep.  The patient has no nausea, vomiting or photophobia or phonophobia with the headache.  He denies any vision changes or neck pain.  He may take Tylenol with good improvement of the headache.  The headaches are usually a 5 or 6 out of 10 on a pain scale and are described as a dull achy pain.  The patient reports no sinus drainage or allergy symptoms.  He denies any numbness or weakness of the face, arms, legs.  He has noted recently some problems with tremor that will come and go involving the left upper extremity.  He denies issues controlling the bowels or the bladder.  He was sent to this office for an evaluation.  He recently had MRI of the brain that was relatively unremarkable, no source of the headache was seen.  The headaches are always on the right frontal temporal area, never anywhere else.  He does not get any redness of the eye or tearing of the eye with a  headache.  The patient was placed on Lamisil around the time of onset of headache, but he ran out of his medication a week ago and he has continued to have headaches.  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   Peripheral neuropathy    Renal cell carcinoma 1997   Left kidney   Shoulder pain, right    Sleep apnea    wears CPAP   Sleep talking    Snoring 12/25/2013   Wears glasses     Past Surgical History:  Procedure Laterality Date   ANTERIOR CERVICAL DECOMP/DISCECTOMY FUSION N/A 08/27/2014   Procedure: Cervical six-seven anterior cervical decompression fusion with removal of hardware at Cervical five-six;  Surgeon: Erline Levine, MD;  Location: [facility];  Service: Neurosurgery;  Laterality: N/A;  Cervical six-seven anterior cervical decompression fusion with removal of hardware at Cervical five-six   [place], 2013   CARDIAC CATHETERIZATION  05/10/2007   Minimal CAD, normal LV systolic function, medical management   CARDIAC CATHETERIZATION  04/08/2008   RCA ulcerated 70-80% stenosis, stented with a 3x39mm Endeavor stent at 13atm for 50sec, reduced from 80% ulcerated stenosis to 0%.   CARDIAC CATHETERIZATION  05/07/2009   50% distal left main disease-IVUS or flow wire too dangerous in particular setting to perform intervention.   CARDIAC CATHETERIZATION  03/18/2010   Medical management   CARDIOVASCULAR STRESS TEST  02/09/2012   Normal, no significant wall abnormalities noted   CARPAL TUNNEL RELEASE Bilateral    COLONOSCOPY W/ BIOPSIES AND  POLYPECTOMY     CORONARY STENT PLACEMENT     FEMUR FRACTURE SURGERY Left 1995   HERNIA REPAIR     NECK SURGERY  2005   NEPHRECTOMY Left    secondary to cancer   TOTAL KNEE ARTHROPLASTY Left 2005   TOTAL KNEE ARTHROPLASTY Right 04/13/2018   Procedure: RIGHT TOTAL KNEE ARTHROPLASTY;  Surgeon: Netta Cedars, MD;  Location: [facility];  Service: Orthopedics;  Laterality: Right;    Family History  Problem Relation Age of Onset   Asthma Mother    Heart disease Mother    Hypertension Mother    Esophageal cancer Father    Barrett's esophagus Father    Bone cancer Father        mets from esophagus   Heart disease Father    Heart disease Paternal Grandfather    Heart attack Paternal Grandfather 26   Colon cancer Maternal Uncle    Diabetes Sister    Cancer Sister        Cervical cancer   Stomach cancer Neg Hx        12-20202: The patient had the first sleep study in the year 2014 at [facility] with me.    CPAP download:  The patient's compliance report shows that he used his CPAP 24 out of 30 days equivalent to 80% he is [facility] this 30-day recording low back on the days before-08-27-2019. He takes Belsomra and hydralazine at night most nights and this usually allows him to sleep more than 4 hours.  However there have been several days for he just did not get enough sleep to make the 4-hour mark.  His CPAP is not an autotitrator it is set at 10 cm water pressure with 3 cm EPR and he has a very good residual apnea index of only 2.4 events per hour of sleep he does have moderate air leakage.    Family medical /sleep history: No other family member on OSA, insomnia..    Social history: Patient is retired from [employer]- at age 55,  he was life long exposed to [employer] ides and herbicides, weed-killer,  this started in high school- he was exposed to PARAQUAT- and lives in a household with 2 persons. Family status is married , with adult  son children,3 grandchildren.  Pets are not present. Tobacco use; none .  ETOH use; quit in 2012 - because of health , Caffeine intake in form of Coffee( 1 cup) Soda(  no) Tea ( 1 glass) or energy drinks. Regular exercise in form of gardening.   Hobbies :gardening.   Sleep habits are as follows: The patient's dinner time is between 6-7 PM. The patient goes to bed at 8.30-9.30 PM and it may take 2-3 hours to go to sleep. The bedroom in cool, quiet and dark. He continues to sleep for 3-5 hours, wakes up may be once for bathroom break, The preferred sleep position is sideways, with the support of 1 pillow, adjustable bed is kept flat.  Dreams are reportedly frequent/vividly - often related to childhood.  9 AM is the usual rise time.  The patient wakes up with an alarm at 8-9 Am but while depressed stayed in bed until 12. Marland Kitchen  Hereports not feeling refreshed or restored in AM, with symptoms such as dry mouth,  Frequent morning headaches , and residual fatigue.  Naps are taken frequently, lasting from 30 to 180 minutes and are even less refreshing than nocturnal sleep.    Review of Systems: Out of a complete 14 system review, the patient complains of only the following symptoms, and all other reviewed systems are negative.:  Fatigue, sleepiness , snoring, fragmented sleep, non-restorative sleep.  His nasal pillows slip off a lot, air leak.    How likely are you to doze in the following situations: 0 = not likely, 1 = slight chance, 2 = moderate chance, 3 = high chance   Sitting and Reading? Watching Television? Sitting inactive in a public place (theater or meeting)? As a passenger in a car for an hour without a break? Lying down in the afternoon when circumstances permit? Sitting and talking to someone? Sitting quietly after lunch without alcohol? In a car, while stopped for a few minutes in traffic?   Total = 11/ 24 points   FSS endorsed at 56/ 63 points.   Social History   Socioeconomic  History   Marital status: Married    Spouse name: Not on file   Number of children: 1   Years of education: 14   Highest education level: Not on file  Occupational History   Occupation: OWNER. Landscape Design/horticulture    Employer: [employer]   Occupation: OWNER    Employer: [employer]  Tobacco Use   Smoking status: Never   Smokeless tobacco: Never  Vaping Use   Vaping Use: Never used  Substance and Sexual Activity   Alcohol use: Not Currently   Drug use: No   Sexual activity: Not Currently  Other Topics Concern   Not on file  Social History Narrative   Patient is married [name]).   Patient drinks one cup of coffee but not everyday.   Patient has one child.   Patient has a college education.   Patient is right-handed.   Social Determinants of Health   Financial Resource Strain: Not on file  Food Insecurity: Not on file  Transportation Needs: Not on file  Physical Activity: Not on file  Stress: Not on file  Social Connections: Not on file    Family History  Problem Relation Age of Onset   Asthma Mother    Heart disease Mother    Hypertension Mother    Thyroid disease Mother    Esophageal cancer Father    Barrett's esophagus Father    Bone cancer Father        mets from esophagus   Heart disease Father    Cancer Father    Depression Father    Anxiety  disorder Father    Alcoholism Father    Obesity Father    Diabetes Sister    Cancer Sister        Cervical cancer   Colon cancer Maternal Uncle    Heart disease Paternal Grandfather    Heart attack Paternal Grandfather 43   Stomach cancer Neg Hx    Colon polyps Neg Hx    Rectal cancer Neg Hx     Past Medical History:  Diagnosis Date   Anxiety    Asthma    Cervical vertebral fusion 05/02/2014   Now presenting with C5-6 radiculopathy   Coronary artery disease    COVID 01/2020   pain all over monoclonal antibodies given all symptoms resolved   GERD (gastroesophageal reflux disease)     H/O heart artery stent 2000   to right rca   Heart attack ([place]) 2009   History of depression 03/19/2021   History of esophageal stricture    History of spinal fracture 2015   Hyperlipidemia    Hypertension    Hypothyroidism    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 tremor   Osteoarthritis    lower back   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   Renal cell carcinoma 1997   Left kidney   Sleep apnea    wears CPAP   Sleep talking    Snoring 12/25/2013   Wears glasses     Past Surgical History:  Procedure Laterality Date   ANTERIOR CERVICAL DECOMP/DISCECTOMY FUSION N/A 08/27/2014   Procedure: Cervical six-seven anterior cervical decompression fusion with removal of hardware at Cervical five-six;  Surgeon: Erline Levine, MD;  Location: [facility];  Service: Neurosurgery;  Laterality: N/A;  Cervical six-seven anterior cervical decompression fusion with removal of hardware at Cervical five-six   BACK SURGERY     1992, 2013 lower back   CARDIAC CATHETERIZATION  05/10/2007   Minimal CAD, normal LV systolic function, medical management   CARDIAC CATHETERIZATION  04/08/2008   RCA ulcerated 70-80% stenosis, stented with a 3x38mm Endeavor stent at 13atm for 50sec, reduced from 80% ulcerated stenosis to 0%.   CARDIAC CATHETERIZATION  05/07/2009   50% distal left main disease-IVUS or flow wire too dangerous in particular setting to perform intervention.   CARDIAC CATHETERIZATION  03/18/2010   Medical management   CARDIOVASCULAR STRESS TEST  02/09/2012   Normal, no significant wall abnormalities noted   CARPAL TUNNEL RELEASE Bilateral 2000   COLONOSCOPY     COLONOSCOPY W/ BIOPSIES AND POLYPECTOMY     COLONOSCOPY WITH PROPOFOL  06/01/2021   [facility]     as infant groin   NECK SURGERY  2005   NEPHRECTOMY Left 1997   secondary to cancer   TOTAL KNEE ARTHROPLASTY Left  2005   TOTAL KNEE ARTHROPLASTY Right 04/13/2018   Procedure: RIGHT TOTAL KNEE ARTHROPLASTY;  Surgeon: Netta Cedars, MD;  Location: [facility];  Service: Orthopedics;  Laterality: Right;   TRANSURETHRAL RESECTION OF PROSTATE N/A 03/25/2021   Procedure: TRANSURETHRAL RESECTION OF THE PROSTATE (TURP);  Surgeon: Franchot Gallo, MD;  Location: [facility];  Service: Urology;  Laterality: N/A;     Current Outpatient Medications on File Prior to Visit  Medication Sig Dispense Refill   albuterol (VENTOLIN HFA) 108 (90 Base) MCG/ACT inhaler Can inhale two puffs every four to six hours as needed for cough, wheeze,  shortness of breath, or chest tightness. 18 g 1   amLODipine (NORVASC) 10 MG tablet Take 10 mg by mouth daily.     ARIPiprazole (ABILIFY) 5 MG tablet Take 5 mg by mouth every morning.     aspirin 325 MG EC tablet Take 325 mg by mouth daily.     betamethasone dipropionate 0.05 % cream Apply 1 application topically daily as needed (dry skin).     DULoxetine (CYMBALTA) 60 MG capsule Take 60 mg by mouth every morning.     esomeprazole (NEXIUM) 40 MG capsule Take 1 capsule (40 mg total) by mouth daily at 12 noon. 30 capsule 5   ezetimibe (ZETIA) 10 MG tablet Take 10 mg by mouth daily.     finasteride (PROSCAR) 5 MG tablet Take 5 mg by mouth daily.     fluticasone (FLOVENT HFA) 110 MCG/ACT inhaler 2 puffs twice a day with a spacer to prevent cough or wheeze. Rinse mouth after use. 1 each 5   furosemide (LASIX) 20 MG tablet Take 20 mg by mouth daily as needed for edema.     gemfibrozil (LOPID) 600 MG tablet Take 600 mg by mouth 2 (two) times daily before a meal.     ipratropium-albuterol (DUONEB) 0.5-2.5 (3) MG/3ML SOLN Take 3 mLs by nebulization every 6 (six) hours as needed (wheezing, coughing, shortness of breath). 360 mL 1   isosorbide mononitrate (IMDUR) 30 MG 24 hr tablet take 1 tablet by mouth once daily (Patient taking differently: at bedtime.) 15 tablet 0   levothyroxine  (SYNTHROID) 25 MCG tablet Take 25 mcg by mouth every morning.     mometasone-formoterol (DULERA) 200-5 MCG/ACT AERO Inhale 2 puffs into the lungs 2 (two) times daily.     montelukast (SINGULAIR) 10 MG tablet TAKE 1 TABLET(10 MG) BY MOUTH AT BEDTIME (Patient taking differently: Take 10 mg by mouth at bedtime.) 90 tablet 1   Multiple Vitamin (MULTIVITAMIN) tablet Take 1 tablet by mouth at bedtime.      Omega-3 Fatty Acids (FISH OIL) 500 MG CAPS Take 500 mg by mouth daily.     potassium chloride (KLOR-CON) 10 MEQ tablet Take 10 mEq by mouth daily as needed (low levels).     PRALUENT 150 MG/ML SOAJ INJECT 150 MG INTO THE SKIN EVERY 14 DAYS 2 mL 11   silodosin (RAPAFLO) 8 MG CAPS capsule Take 8 mg by mouth at bedtime.     Spacer/Aero-Holding Chambers (AEROCHAMBER PLUS) inhaler Use as instructed 1 each 2   Spacer/Aero-Holding Chambers (OPTICHAMBER DIAMOND) MISC Use as directed with inhaler. 1 each 0   telmisartan (MICARDIS) 20 MG tablet Take 20 mg by mouth daily.     Tiotropium Bromide Monohydrate (SPIRIVA RESPIMAT) 1.25 MCG/ACT AERS Inhale 2 puffs into the lungs daily.     traZODone (DESYREL) 50 MG tablet Take 0.5-1 tablets (25-50 mg total) by mouth at bedtime as needed for sleep. 30 tablet 5   Vibegron (GEMTESA) 75 MG TABS Take 1 Dose by mouth daily.     vitamin C (ASCORBIC ACID) 500 MG tablet Take 500 mg by mouth 2 (two) times daily.      VITAMIN D PO Take 50,000 Units by mouth once a week.     XOLAIR 150 MG injection INJECT 150MG  SUBCUTANEOUSLY EVERY 4 WEEKS (GIVEN AT MD  OFFICE) (Patient taking differently: Inject 150 mg into the skin every 28 (twenty-eight) days.) 1 each 11   Current Facility-Administered Medications on File Prior to Visit  Medication Dose Route Frequency Provider  Last Rate Last Admin   omalizumab [name]) injection 150 mg  150 mg Subcutaneous Q28 days Dara Hoyer, FNP   150 mg at 09/20/21 1455    Allergies  Allergen Reactions   Contrast Media [Iodinated Contrast Media]  Other (See Comments)    Was told not to take d/t pt only having 1 kidney   Nsaids Other (See Comments)    Was told not to take d/t pt only having 1 kidney   Penicillins Hives, Itching and Other (See Comments)    Has patient had a PCN reaction causing immediate rash, facial/tongue/throat swelling, SOB or lightheadedness with hypotension: Unknown Has patient had a PCN reaction causing severe rash involving mucus membranes or skin necrosis: Unknown Has patient had a PCN reaction that required hospitalization: Unknown Has patient had a PCN reaction occurring within the last 10 years: No If all of the above answers are "NO", then may proceed with Cephalosporin use.    Sulfonamide Derivatives Hives and Itching   Codeine Nausea Only   Statins Other (See Comments)    Myalgias    Physical exam:  Today's Vitals   10/13/21 0809  BP: 132/73  Pulse: 82  Weight: 204 lb (92.5 kg)  Height: 5' 7.5" (1.715 m)   Body mass index is 31.48 kg/m.   Wt Readings from Last 3 Encounters:  10/13/21 204 lb (92.5 kg)  08/24/21 207 lb (93.9 kg)  06/02/21 205 lb 11 oz (93.3 kg)     Ht Readings from Last 3 Encounters:  10/13/21 5' 7.5" (1.715 m)  08/24/21 5\' 7"  (1.702 m)  06/02/21 5\' 8"  (1.727 m)      General: The patient is awake, alert and appears not in acute distress. The patient is well groomed. Head: Normocephalic, atraumatic. Neck is supple. Mallampati 3,  neck circumference:18 inches . Nasal airflow  patent.  severe hoarseness, dysphonia, decreased facial expression. Retrognathia is not  seen.  Dental status:  Cardiovascular:  Regular rate and cardiac rhythm by pulse,  without distended neck veins. Respiratory: Lungs are clear to auscultation. He is tachypnoeic. .  Skin:  Without evidence of ankle edema, or rash. Trunk: The patient's posture is erect.   Neurologic exam : The patient is awake and alert, oriented to place and time.   Memory subjective described as intact.  Attention span &  concentration ability appears normal.  Speech is fluent,  with dysphonia, not aphasia.   Mood and affect are  dysthymic   Cranial nerves: no loss of smell or taste reported  Pupils are equal and briskly reactive to light. Normal reaction to accomodation. Funduscopic exam deferred  Extraocular movements in vertical and horizontal planes were intact and without nystagmus. No Diplopia. Visual fields by finger perimetry are intact. Hearing was intact to soft voice and finger rubbing.   Facial sensation intact to fine touch.  Facial motor strength is symmetric but facial mimic is reduced- masked face- and tongue and uvula move midline.  Neck ROM : rotation, tilt and flexion extension were normal for age and shoulder shrug was symmetrical.    Motor exam:  Symmetric bulk, tone and ROM.   Generalized elevated tone with bilateral biceps cogwheeling, symmetric grip strength.   Sensory:  Fine touch, pinprick and vibration were tested  and  normal.  Proprioception tested in the upper extremities was normal.   Coordination: Rapid alternating movements in the fingers/hands were of reduced speed.  The Finger-to-nose maneuver was slower but without evidence of ataxia, dysmetria or tremor.  Gait and station: Patient could rise unassisted from a seated position,  ( RN observation) walked without assistive device.  Stance is of normal width/ base and the patient turned with 3 steps.  Toe and heel walk were deferred.  Deep tendon reflexes: in the  upper and lower extremities are symmetric and intact.  Babinski response was deferred .       After spending a total time of  50  minutes face to face and additional time for physical and neurologic examination, review of laboratory studies,  personal review of imaging studies, reports and results of other testing and review of referral information / records as far as provided in visit, I have established the following assessments:  1) DAT scan normal, right  dominant person- with left dominant elevated tone- not much of a tremor. Action induced.  Masked face. Hoarse voice. 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   2) Insomnia  on CPAP. Reduced AHI but high airleaks. Average compliance. No reason to assume OSA has anything to do with his insomnia, as it affects sleep initiation.    chronic sleep initiation insomnia- worse over the last 5-6 month, in spite of medication.   3) depression and insomnia go together - needs follow up with psychiatrist.   insomnia unrelated to apnea, chronic and peristent on medication.  In short, BREYDON SENTERS is presenting without hypersomnia, reports delayed sleep onset and non restorative sleep-symptoms that can be attributed to depression , but excellent apnea control.   I plan to follow up either personally or through our NP within 12  months for OSA follow up. .   CC: I will share my notes with Dr. Jill Poling- [place] and Dr. [name]. .  Electronically signed by: Larey Seat, MD 10/13/2021 8:40 AM  [facility] Board certified by The [employer] of Sleep Medicine and Diplomate of the [employer] of Sleep Medicine. Board certified In Neurology through the [place], Fellow of the [employer] of Neurology. 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

## 2021-10-13 NOTE — Patient Instructions (Signed)
Quality Sleep Information, Adult Quality sleep is important for your mental and physical health. It also improves your quality of life. Quality sleep means you: Are asleep for most of the time you are in bed. Fall asleep within 30 minutes. Wake up no more than once a night.  Are awake for no longer than 20 minutes if you do wake up during the night. Most adults need 7-8 hours of quality sleep each night. How can poor sleep affect me? If you do not get enough quality sleep, you may have: Mood swings. Daytime sleepiness. Confusion. Decreased reaction time. Sleep disorders, such as insomnia and sleep apnea. Difficulty with: Solving problems. Coping with stress. Paying attention. These issues may affect your performance and productivity at work, school, and at home. Lack of sleep may also put you at higher risk for accidents, suicide, and risky behaviors. If you do not get quality sleep you may also be at higher risk for several health problems, including: Infections. Type 2 diabetes. Heart disease. High blood pressure. Obesity. Worsening of long-term conditions, like arthritis, kidney disease, depression, Parkinson's disease, and epilepsy. What actions can I take to get more quality sleep?   Stick to a sleep schedule. Go to sleep and wake up at about the same time each day. Do not try to sleep less on weekdays and make up for lost sleep on weekends. This does not work. Try to get about 30 minutes of exercise on most days. Do not exercise 2-3 hours before going to bed. Limit naps during the day to 30 minutes or less. Do not use any products that contain nicotine or tobacco, such as cigarettes or e-cigarettes. If you need help quitting, ask your health care provider. Do not drink caffeinated beverages for at least 8 hours before going to bed. Coffee, tea, and some sodas contain caffeine. Do not drink alcohol close to bedtime. Do not eat large meals close to bedtime. Do not take naps in  the late afternoon. Try to get at least 30 minutes of sunlight every day. Morning sunlight is best. Make time to relax before bed. Reading, listening to music, or taking a hot bath promotes quality sleep. Make your bedroom a place that promotes quality sleep. Keep your bedroom dark, quiet, and at a comfortable room temperature. Make sure your bed is comfortable. Take out sleep distractions like TV, a computer, smartphone, and bright lights. If you are lying awake in bed for longer than 20 minutes, get up and do a relaxing activity until you feel sleepy. Work with your health care provider to treat medical conditions that may affect sleeping, such as: Nasal obstruction. Snoring. Sleep apnea and other sleep disorders. Talk to your health care provider if you think any of your prescription medicines may cause you to have difficulty falling or staying asleep. If you have sleep problems, talk with a sleep consultant. If you think you have a sleep disorder, talk with your health care provider about getting evaluated by a specialist. Where to find more information [place] website: https://sleepfoundation.org National Heart, Lung, and [place] ([place]): http://www.saunders.info/.pdf Centers for Disease Control and Prevention (CDC): LearningDermatology.pl Contact a health care provider if you: Have trouble getting to sleep or staying asleep. Often wake up very early in the morning and cannot get back to sleep. Have daytime sleepiness. Have daytime sleep attacks of suddenly falling asleep and sudden muscle weakness (narcolepsy). Have a tingling sensation in your legs with a strong urge to move your legs (restless  legs syndrome). Stop breathing briefly during sleep (sleep apnea). Think you have a sleep disorder or are taking a medicine that is affecting your quality of sleep. Summary Most adults need 7-8 hours of quality sleep each  night. Getting enough quality sleep is an important part of health and well-being. Make your bedroom a place that promotes quality sleep and avoid things that may cause you to have poor sleep, such as alcohol, caffeine, smoking, and large meals. Talk to your health care provider if you have trouble falling asleep or staying asleep. This information is not intended to replace advice given to you by your health care provider. Make sure you discuss any questions you have with your health care provider. Document Revised: 12/13/2017 Document Reviewed: 12/13/2017 Elsevier Patient Education  2022 [place]. Insomnia Insomnia is a sleep disorder that makes it difficult to fall asleep or stay asleep. Insomnia can cause fatigue, low energy, difficulty concentrating, mood swings, and poor performance at work or school. There are three different ways to classify insomnia: Difficulty falling asleep. Difficulty staying asleep. Waking up too early in the morning. Any type of insomnia can be long-term (chronic) or short-term (acute). Both are common. Short-term insomnia usually lasts for three months or less. Chronic insomnia occurs at least three times a week for longer than three months. What are the causes? Insomnia may be caused by another condition, situation, or substance, such as: Anxiety. Certain medicines. Gastroesophageal reflux disease (GERD) or other gastrointestinal conditions. Asthma or other breathing conditions. Restless legs syndrome, sleep apnea, or other sleep disorders. Chronic pain. Menopause. Stroke. Abuse of alcohol, tobacco, or illegal drugs. Mental health conditions, such as depression. Caffeine. Neurological disorders, such as Alzheimer's disease. An overactive thyroid (hyperthyroidism). Sometimes, the cause of insomnia may not be known. What increases the risk? Risk factors for insomnia include: Gender. Women are affected more often than men. Age. Insomnia is more  common as you get older. Stress. Lack of exercise. Irregular work schedule or working night shifts. Traveling between different time zones. Certain medical and mental health conditions. What are the signs or symptoms? If you have insomnia, the main symptom is having trouble falling asleep or having trouble staying asleep. This may lead to other symptoms, such as: Feeling fatigued or having low energy. Feeling nervous about going to sleep. Not feeling rested in the morning. Having trouble concentrating. Feeling irritable, anxious, or depressed. How is this diagnosed? This condition may be diagnosed based on: Your symptoms and medical history. Your health care provider may ask about: Your sleep habits. Any medical conditions you have. Your mental health. A physical exam. How is this treated? Treatment for insomnia depends on the cause. Treatment may focus on treating an underlying condition that is causing insomnia. Treatment may also include: Medicines to help you sleep. Counseling or therapy. Lifestyle adjustments to help you sleep better. Follow these instructions at home: Eating and drinking  Limit or avoid alcohol, caffeinated beverages, and cigarettes, especially close to bedtime. These can disrupt your sleep. Do not eat a large meal or eat spicy foods right before bedtime. This can lead to digestive discomfort that can make it hard for you to sleep. Sleep habits  Keep a sleep diary to help you and your health care provider figure out what could be causing your insomnia. Write down: When you sleep. When you wake up during the night. How well you sleep. How rested you feel the next day. Any side effects of medicines you are taking. What you  eat and drink. Make your bedroom a dark, comfortable place where it is easy to fall asleep. Put up shades or blackout curtains to block light from outside. Use a white noise machine to block noise. Keep the temperature cool. Limit  screen use before bedtime. This includes: Watching TV. Using your smartphone, tablet, or computer. Stick to a routine that includes going to bed and waking up at the same times every day and night. This can help you fall asleep faster. Consider making a quiet activity, such as reading, part of your nighttime routine. Try to avoid taking naps during the day so that you sleep better at night. Get out of bed if you are still awake after 15 minutes of trying to sleep. Keep the lights down, but try reading or doing a quiet activity. When you feel sleepy, go back to bed. General instructions Take over-the-counter and prescription medicines only as told by your health care provider. Exercise regularly, as told by your health care provider. Avoid exercise starting several hours before bedtime. Use relaxation techniques to manage stress. Ask your health care provider to suggest some techniques that may work well for you. These may include: Breathing exercises. Routines to release muscle tension. Visualizing peaceful scenes. Make sure that you drive carefully. Avoid driving if you feel very sleepy. Keep all follow-up visits as told by your health care provider. This is important. Contact a health care provider if: You are tired throughout the day. You have trouble in your daily routine due to sleepiness. You continue to have sleep problems, or your sleep problems get worse. Get help right away if: You have serious thoughts about hurting yourself or someone else. If you ever feel like you may hurt yourself or others, or have thoughts about taking your own life, get help right away. You can go to your nearest emergency department or call: Your local emergency services (911 in the [place] A suicide crisis helpline, such as the [facility] at 2530821050 or 988 in the [place]. This is open 24 hours a day. Summary Insomnia is a sleep disorder that makes it difficult to fall asleep or  stay asleep. Insomnia can be long-term (chronic) or short-term (acute). Treatment for insomnia depends on the cause. Treatment may focus on treating an underlying condition that is causing insomnia. Keep a sleep diary to help you and your health care provider figure out what could be causing your insomnia. This information is not intended to replace advice given to you by your health care provider. Make sure you discuss any questions you have with your health care provider. Document Revised: 03/31/2021 Document Reviewed: 07/16/2020 Elsevier Patient Education  2022 [employer].

## 2021-10-18 ENCOUNTER — Other Ambulatory Visit: Payer: Self-pay

## 2021-10-18 ENCOUNTER — Ambulatory Visit (INDEPENDENT_AMBULATORY_CARE_PROVIDER_SITE_OTHER): Payer: Medicare PPO

## 2021-10-18 DIAGNOSIS — J4550 Severe persistent asthma, uncomplicated: Secondary | ICD-10-CM

## 2021-10-27 ENCOUNTER — Ambulatory Visit: Payer: Medicare Other | Admitting: Family Medicine

## 2021-10-28 ENCOUNTER — Other Ambulatory Visit (HOSPITAL_COMMUNITY): Payer: Self-pay | Admitting: Internal Medicine

## 2021-10-28 ENCOUNTER — Other Ambulatory Visit: Payer: Self-pay | Admitting: Internal Medicine

## 2021-10-28 ENCOUNTER — Other Ambulatory Visit: Payer: Self-pay

## 2021-10-28 ENCOUNTER — Ambulatory Visit (HOSPITAL_COMMUNITY)
Admission: RE | Admit: 2021-10-28 | Discharge: 2021-10-28 | Disposition: A | Discharge status: Home / Self Care | Payer: Medicare PPO | Source: Ambulatory Visit | Attending: Internal Medicine | Admitting: Internal Medicine

## 2021-10-28 DIAGNOSIS — R1032 Left lower quadrant pain: Secondary | ICD-10-CM

## 2021-10-28 DIAGNOSIS — R1110 Vomiting, unspecified: Secondary | ICD-10-CM

## 2021-10-29 ENCOUNTER — Other Ambulatory Visit: Payer: Self-pay | Admitting: Internal Medicine

## 2021-10-29 ENCOUNTER — Other Ambulatory Visit (HOSPITAL_COMMUNITY): Payer: Self-pay | Admitting: Internal Medicine

## 2021-10-29 ENCOUNTER — Ambulatory Visit (HOSPITAL_COMMUNITY)
Admission: RE | Admit: 2021-10-29 | Discharge: 2021-10-29 | Disposition: A | Discharge status: Home / Self Care | Payer: Medicare PPO | Source: Ambulatory Visit | Attending: Internal Medicine | Admitting: Internal Medicine

## 2021-10-29 DIAGNOSIS — J9811 Atelectasis: Secondary | ICD-10-CM | POA: Diagnosis present

## 2021-11-11 DIAGNOSIS — M25562 Pain in left knee: Secondary | ICD-10-CM | POA: Insufficient documentation

## 2021-11-12 NOTE — Progress Notes (Signed)
11/15/2021 Jared [name] [id number] 1950/12/12   ASSESSMENT AND PLAN:   Diverticulitis of colon -     CBC with Differential/Platelet; Future -     Comprehensive metabolic panel; Future -     C-reactive protein; Future -     ciprofloxacin (CIPRO) 500 MG tablet; Take 1 tablet (500 mg total) by mouth 2 (two) times daily for 14 days. -     metroNIDAZOLE (FLAGYL) 500 MG tablet; Take 1 tablet (500 mg total) by mouth 3 (three) times daily for 14 days. -     polyethylene glycol (MIRALAX) 17 g packet; Take 17 g by mouth daily. -     hyoscyamine (LEVSIN) 0.125 MG tablet; Take 1 tablet (0.125 mg total) by mouth every 6 (six) hours as needed for cramping. Will repeat labs, uncertain if continuing diverticulitis versus diverticular pain/constipation. Will give cipro/flagyl 14 day versus 10 days.  ER precautions discussed If labs are abnormal will repeat CT AB and pelvis Treat with miralax/fiber Follow up 2-3 months.  Discussed precautions for levsin, will do low dose.   Constipation, unspecified constipation type -     polyethylene glycol (MIRALAX) 17 g packet; Take 17 g by mouth daily. 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 male  with a past medical history of coronary artery disease status post DES stent to RCA 2000, sleep apnea on CPAP, GERD and others listed below, known to Dr. Havery Moros returns to clinic today for evaluation of diverticulitis.  Last seen in the office 02/26/2013 for rectal bleeding with Dr. Olevia Perches 06/01/2021 screening colonoscopy with Dr. Havery Moros showed 6 diminutive adenomatous polyps with diverticulosis recall 3 years  10/28/2021 patient had CT abdomen pelvis due to left lower quadrant pain with vomiting and cramping for 1 month found  to have abnormal mesenteric stranding sigmoid colon likely diverticulitis and middle lobe atelectasis.  Diffuse hepatic steatosis Patient did not have any labs at the time.  Patient states for last 2 months he has had intermittent episodes of AB pain, with nausea/vomiting due to pain, no fever, chills at the time.  PCP sent him for CT 10/28/2021 that showed diverticulitis and fatty liver.  He was given Cipro/Flaygl by PCP for 10 days, things improved until this past Saturday.  He states from 10 AM to 4-5 in the afternoon, he had severe AB cramping lower AB.  He states he has had small volume, hard stools during this time. Had Nausea and vomiting. Denies fever, chills.  Denies melena, hematochezia.  Has started on florastor.  No NSAIDS, has allergy to PCN.   Previous GI history: 10/28/2021 CT abdomen pelvis without contrast 1. Abnormal mesenteric stranding along the junction of the descending and sigmoid colon. There is substantial diverticulosis in this region and I would favor diverticulitis over an unrelated distal colitis. No extraluminal gas or abscess. 2. Complete atelectasis of the right middle lobe. I do not see a definite central obstructing lesion, but the right middle lobe bronchus and its branches appear narrow. A specific cause for the right middle lobe atelectasis is not identified on today's noncontrast abdomen CT. 3. Other imaging findings of potential clinical significance: Aortic Atherosclerosis (ICD10-I70.0). Coronary atherosclerosis. Diffuse hepatic steatosis. Bilateral mild foraminal impingement at L5-S1. 06/01/2021 colonoscopy surveillance due to history of adenomas with  Dr. Havery Moros showed 6 diminutive adenomatous polyps and diverticulosis recall 3 years recent screening colonoscopy with Dr. Havery Moros  4 adenomas noted 05/2017  CT ABDOMEN PELVIS WO CONTRAST  Result Date: 10/28/2021 CLINICAL DATA:  Left lower quadrant abdominal pain with vomiting. Cramping for 1  month. EXAM: CT ABDOMEN AND PELVIS WITHOUT CONTRAST TECHNIQUE: Multidetector CT imaging of the abdomen and pelvis was performed following the standard protocol without IV contrast. RADIATION DOSE REDUCTION: This exam was performed according to the departmental dose-optimization program which includes automated exposure control, adjustment of the mA and/or kV according to patient size and/or use of iterative reconstruction technique. COMPARISON:  07/24/2020 FINDINGS: Lower chest: Atherosclerotic calcification in the left main, left anterior descending, and right coronary artery noted. There is atelectasis of the entire visualized right middle lobe. I do not see an obvious obstructing lesion but the visualize right middle lobe bronchus and its branches appear somewhat narrow. This atelectasis was not present on 07/24/2020. Mild prominence of epicardial and pericardial adipose tissues. Hepatobiliary: Diffuse hepatic steatosis. Punctate calcifications in the liver are stable and likely postinflammatory or from remote granulomatous disease. Gallbladder unremarkable. No biliary dilatation. Pancreas: Unremarkable Spleen: Punctate calcifications compatible with old granulomatous disease. Adrenals/Urinary Tract: Both adrenal glands appear normal. 2.4 by 1.8 cm fluid density lesion medially in the right mid kidney on image 41 series 2, likely a cyst. Similar fluid density exophytic 4.1 by 3.8 cm lesion of the right kidney lower pole, image 50 series 2. 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 No worrisome findings along the nephrectomy site. Left distal ureter unremarkable. Stomach/Bowel: Descending and sigmoid colon diverticulosis. Substantial localized wall thickening at the junction of the descending and sigmoid colon with associated mesenteric stranding favoring acute diverticulitis. No extraluminal gas or abscess. Tiny focus of density in the otherwise normal appearing appendix could represent  an appendicolith. No dilated small bowel. Vascular/Lymphatic: Atherosclerosis is present, including aortoiliac atherosclerotic disease. 0.9 cm short axis left colic node on image 61 series 2, nonspecific probably reactive. Reproductive: Prior TURP. Other: No supplemental non-categorized findings. Musculoskeletal: Tracks from prior hardware in the left hip. Lower lumbar spondylosis and degenerative disc disease resulting in mild bilateral foraminal stenosis at L5-S1. Postoperative findings on the right at L4-5. IMPRESSION: 1. Abnormal mesenteric stranding along the junction of the descending and sigmoid colon. There is substantial diverticulosis in this region and I would favor diverticulitis over an unrelated distal colitis. No extraluminal gas or abscess. 2. Complete atelectasis of the right middle lobe. I do not see a definite central obstructing lesion, but the right middle lobe bronchus and its branches appear narrow. A specific cause for the right middle lobe atelectasis is not identified on today's noncontrast abdomen CT. 3. Other imaging findings of potential clinical significance: Aortic Atherosclerosis (ICD10-I70.0). Coronary atherosclerosis. Diffuse hepatic steatosis. Bilateral mild foraminal impingement at L5-S1. Electronically Signed   By: Van Clines M.D.   On: 10/28/2021 18:22   CT CHEST WO CONTRAST  Result Date: 10/29/2021 CLINICAL DATA:  Discoid atelectasis EXAM: CT CHEST WITHOUT CONTRAST TECHNIQUE: Multidetector CT imaging of the chest was performed following the standard protocol without IV contrast. RADIATION DOSE REDUCTION: This exam was performed according to the departmental dose-optimization program which includes automated exposure control, adjustment of the mA and/or kV according to patient size and/or use of iterative reconstruction technique. COMPARISON:  CT 10/28/2021, CT chest report 08/23/2014, CT chest 01/29/2014 FINDINGS: Cardiovascular: Limited evaluation without  intravenous contrast. Mild aortic atherosclerosis. No aneurysm. Mild coronary calcification.  Normal cardiac size. Mediastinum/Nodes: Midline trachea. No thyroid mass. No suspicious lymph nodes. Esophagus is normal Lungs/Pleura: No acute consolidation, pleural effusion or pneumothorax. Similar appearance of mild volume loss and chronic consolidation at the right middle lobe anterior to the pulmonary fissure, similar appearance on 2015 comparison. Small amount of chronic atelectasis or scar at the lingula as well. Stable benign-appearing punctate lower lobe nodules. Upper Abdomen: No acute abnormality. Musculoskeletal: No chest wall mass or suspicious bone lesions identified. IMPRESSION: 1. Chronic volume loss and mild consolidation in the right middle lobe and lingula, stable as compared with chest CT from 2015. No suspicious pulmonary mass is visualized. 2. Stable benign-appearing pulmonary nodules. No further follow-up recommended Aortic Atherosclerosis (ICD10-I70.0). Electronically Signed   By: Donavan Foil M.D.   On: 10/29/2021 17:55    Current Medications:   Current Outpatient Medications (Endocrine & Metabolic):    levothyroxine (SYNTHROID) 25 MCG tablet, Take 25 mcg by mouth every morning.   Current Outpatient Medications (Cardiovascular):    amLODipine (NORVASC) 10 MG tablet, Take 10 mg by mouth daily.   furosemide (LASIX) 20 MG tablet, Take 20 mg by mouth daily as needed for edema.   gemfibrozil (LOPID) 600 MG tablet, Take 600 mg by mouth 2 (two) times daily before a meal.   isosorbide mononitrate (IMDUR) 30 MG 24 hr tablet, take 1 tablet by mouth once daily (Patient taking differently: at bedtime.)   PRALUENT 150 MG/ML SOAJ, INJECT 150 MG INTO THE SKIN EVERY 14 DAYS   telmisartan (MICARDIS) 20 MG tablet, Take 20 mg by mouth daily.   Current Outpatient Medications (Respiratory):    albuterol (VENTOLIN HFA) 108 (90 Base) MCG/ACT inhaler, Can inhale two puffs every four to six hours as  needed for cough, wheeze, shortness of breath, or chest tightness.   fluticasone (FLOVENT HFA) 110 MCG/ACT inhaler, 2 puffs twice a day with a spacer to prevent cough or wheeze. Rinse mouth after use.   ipratropium-albuterol (DUONEB) 0.5-2.5 (3) MG/3ML SOLN, Take 3 mLs by nebulization every 6 (six) hours as needed (wheezing, coughing, shortness of breath).   mometasone-formoterol (DULERA) 200-5 MCG/ACT AERO, Inhale 2 puffs into the lungs 2 (two) times daily.   montelukast (SINGULAIR) 10 MG tablet, TAKE 1 TABLET(10 MG) BY MOUTH AT BEDTIME (Patient taking differently: Take 10 mg by mouth at bedtime.)   Tiotropium Bromide Monohydrate (SPIRIVA RESPIMAT) 1.25 MCG/ACT AERS, Inhale 2 puffs into the lungs daily.   XOLAIR 150 MG injection, INJECT 150MG  SUBCUTANEOUSLY EVERY 4 WEEKS (GIVEN AT MD  OFFICE) (Patient taking differently: Inject 150 mg into the skin every 28 (twenty-eight) days.)  Current Facility-Administered Medications (Respiratory):    omalizumab [name]) injection 150 mg  Current Outpatient Medications (Analgesics):    aspirin 325 MG EC tablet, Take 325 mg by mouth daily.     Current Outpatient Medications (Other):    ARIPiprazole (ABILIFY) 5 MG tablet, Take 5 mg by mouth every morning.   betamethasone dipropionate 0.05 % cream, Apply 1 application topically daily as needed (dry skin).   ciprofloxacin (CIPRO) 500 MG tablet, Take 1 tablet (500 mg total) by mouth 2 (two) times daily for 14 days.   DULoxetine (CYMBALTA) 60 MG capsule, Take 60 mg by mouth every morning.   esomeprazole (NEXIUM) 40 MG capsule, Take 1 capsule (40 mg total) by mouth daily at 12 noon.   finasteride (PROSCAR) 5 MG tablet, Take 5 mg by mouth daily.   hyoscyamine (LEVSIN) 0.125 MG tablet, Take 1 tablet (0.125 mg total) by mouth  every 6 (six) hours as needed for cramping.   metroNIDAZOLE (FLAGYL) 500 MG tablet, Take 1 tablet (500 mg total) by mouth 3 (three) times daily for 14 days.   Multiple Vitamin  (MULTIVITAMIN) tablet, Take 1 tablet by mouth at bedtime.    Omega-3 Fatty Acids (FISH OIL) 500 MG CAPS, Take 500 mg by mouth daily.   polyethylene glycol (MIRALAX) 17 g packet, Take 17 g by mouth daily.   potassium chloride (KLOR-CON) 10 MEQ tablet, Take 10 mEq by mouth daily as needed (low levels).   silodosin (RAPAFLO) 8 MG CAPS capsule, Take 8 mg by mouth at bedtime.   Spacer/Aero-Holding Chambers (AEROCHAMBER PLUS) inhaler, Use as instructed   Spacer/Aero-Holding [facility] DIAMOND) MISC, Use as directed with inhaler.   traZODone (DESYREL) 50 MG tablet, Take 1-2 tablets (50-100 mg total) by mouth at bedtime as needed for sleep.   Vibegron (GEMTESA) 75 MG TABS, Take 1 Dose by mouth daily.   vitamin C (ASCORBIC ACID) 500 MG tablet, Take 500 mg by mouth 2 (two) times daily.    VITAMIN D PO, Take 50,000 Units by mouth once a week.   Surgical History:  He  has a past surgical history that includes Back surgery; Neck surgery (2005); Coronary stent placement; Femur fracture surgery (Left, 1995); Total knee arthroplasty (Left, 2005); Carpal tunnel release (Bilateral, 2000); Nephrectomy (Left, 1997); Cardiac catheterization (05/10/2007); Cardiac catheterization (04/08/2008); Cardiac catheterization (05/07/2009); Cardiac catheterization (03/18/2010); Cardiovascular stress test (02/09/2012); Anterior cervical decomp/discectomy fusion (N/A, 08/27/2014); Hernia repair; Colonoscopy w/ biopsies and polypectomy; Total knee arthroplasty (Right, 04/13/2018); Transurethral resection of prostate (N/A, 03/25/2021); Colonoscopy; and Colonoscopy with propofol (06/01/2021). Family History:  His family history includes Alcoholism in his father; Anxiety disorder in his father; Asthma in his mother; Barrett's esophagus in his father; Bone cancer in his father; Cancer in his father and sister; Colon cancer in his maternal uncle; Depression in his father; Diabetes in his sister; Esophageal cancer in his father;  Heart attack (age of onset: 9) in his paternal grandfather; Heart disease in his father, mother, and paternal grandfather; Hypertension in his mother; Obesity in his father; Thyroid disease in his mother. Social History:   reports that he has never smoked. He has never used smokeless tobacco. He reports that he does not currently use alcohol. He reports that he does not use drugs.  Current Medications, Allergies, Past Medical History, Past Surgical History, Family History and Social History were reviewed in [employer] record.  Physical Exam: BP 102/60    Pulse 92    Ht 5' 7.5" (1.715 m)    Wt 203 lb (92.1 kg)    BMI 31.33 kg/m  General:   Pleasant, well developed male in no acute distress Eyes: sclerae anicteric,conjunctive pink  Heart:  regular rate and rhythm Pulm: Clear anteriorly; no wheezing Abdomen:  Soft, Obese AB, skin exam normal, Normal bowel sounds. mild tenderness in the LLQ. With guarding and Without rebound, without hepatomegaly. Extremities:  Without edema. Peripheral pulses intact.  Neurologic:  Alert and  oriented x4;  flat affect, grossly normal neurologically. Right nonintention tremor.  Skin:   Dry and intact without significant lesions or rashes. Psychiatric: Demonstrates good judgement and reason without abnormal affect or behaviors.  Vladimir Crofts, PA-C 11/15/21

## 2021-11-13 ENCOUNTER — Other Ambulatory Visit: Payer: Self-pay | Admitting: Allergy and Immunology

## 2021-11-15 ENCOUNTER — Other Ambulatory Visit (INDEPENDENT_AMBULATORY_CARE_PROVIDER_SITE_OTHER): Payer: Medicare PPO

## 2021-11-15 ENCOUNTER — Encounter: Payer: Self-pay | Admitting: Physician Assistant

## 2021-11-15 ENCOUNTER — Other Ambulatory Visit: Payer: Self-pay

## 2021-11-15 ENCOUNTER — Ambulatory Visit (INDEPENDENT_AMBULATORY_CARE_PROVIDER_SITE_OTHER): Payer: Medicare PPO

## 2021-11-15 ENCOUNTER — Ambulatory Visit: Payer: Medicare PPO | Admitting: Physician Assistant

## 2021-11-15 ENCOUNTER — Telehealth: Payer: Self-pay | Admitting: Physician Assistant

## 2021-11-15 VITALS — BP 102/60 | HR 92 | Ht 67.5 in | Wt 203.0 lb

## 2021-11-15 DIAGNOSIS — K5732 Diverticulitis of large intestine without perforation or abscess without bleeding: Secondary | ICD-10-CM

## 2021-11-15 DIAGNOSIS — J4550 Severe persistent asthma, uncomplicated: Secondary | ICD-10-CM | POA: Diagnosis not present

## 2021-11-15 DIAGNOSIS — G212 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: Secondary | ICD-10-CM | POA: Diagnosis not present

## 2021-11-15 DIAGNOSIS — K5900 Constipation, unspecified: Secondary | ICD-10-CM

## 2021-11-15 DIAGNOSIS — N1831 Chronic kidney disease, stage 3a: Secondary | ICD-10-CM

## 2021-11-15 LAB — COMPREHENSIVE METABOLIC PANEL
ALT: 14 U/L (ref 0–53)
AST: 14 U/L (ref 0–37)
Albumin: 4.2 g/dL (ref 3.5–5.2)
Alkaline Phosphatase: 66 U/L (ref 39–117)
BUN: 18 mg/dL (ref 6–23)
CO2: 26 mEq/L (ref 19–32)
Calcium: 9.4 mg/dL (ref 8.4–10.5)
Chloride: 103 mEq/L (ref 96–112)
Creatinine, Ser: 1.71 mg/dL — ABNORMAL HIGH (ref 0.40–1.50)
GFR: 39.35 mL/min — ABNORMAL LOW (ref >60.00)
Glucose, Bld: 111 mg/dL — ABNORMAL HIGH (ref 70–99)
Potassium: 4.2 mEq/L (ref 3.5–5.1)
Sodium: 138 mEq/L (ref 135–145)
Total Bilirubin: 0.4 mg/dL (ref 0.2–1.2)
Total Protein: 8.1 g/dL (ref 6.0–8.3)

## 2021-11-15 LAB — CBC WITH DIFFERENTIAL/PLATELET
Basophils Absolute: 0.1 10*3/uL (ref 0.0–0.1)
Basophils Relative: 0.7 % (ref 0.0–3.0)
Eosinophils Absolute: 0.3 10*3/uL (ref 0.0–0.7)
Eosinophils Relative: 4.3 % (ref 0.0–5.0)
HCT: 38.9 % — ABNORMAL LOW (ref 39.0–52.0)
Hemoglobin: 12.7 g/dL — ABNORMAL LOW (ref 13.0–17.0)
Lymphocytes Relative: 26.9 % (ref 12.0–46.0)
Lymphs Abs: 2.2 10*3/uL (ref 0.7–4.0)
MCHC: 32.6 g/dL (ref 30.0–36.0)
MCV: 80.4 fl (ref 78.0–100.0)
Monocytes Absolute: 0.9 10*3/uL (ref 0.1–1.0)
Monocytes Relative: 10.9 % (ref 3.0–12.0)
Neutro Abs: 4.7 10*3/uL (ref 1.4–7.7)
Neutrophils Relative %: 57.2 % (ref 43.0–77.0)
Platelets: 344 10*3/uL (ref 150.0–400.0)
RBC: 4.83 Mil/uL (ref 4.22–5.81)
RDW: 16.8 % — ABNORMAL HIGH (ref 11.5–15.5)
WBC: 8.1 10*3/uL (ref 4.0–10.5)

## 2021-11-15 LAB — C-REACTIVE PROTEIN: CRP: 7.1 mg/dL (ref 0.5–20.0)

## 2021-11-15 MED ORDER — HYOSCYAMINE SULFATE 0.125 MG PO TABS: 0.1250 mg | ORAL_TABLET | Freq: Four times a day (QID) | ORAL | 0 refills | Status: DC | PRN

## 2021-11-15 MED ORDER — CIPROFLOXACIN HCL 500 MG PO TABS: 500.0000 mg | ORAL_TABLET | Freq: Two times a day (BID) | ORAL | 0 refills | Status: AC

## 2021-11-15 MED ORDER — METRONIDAZOLE 500 MG PO TABS: 500.0000 mg | ORAL_TABLET | Freq: Three times a day (TID) | ORAL | 0 refills | Status: AC

## 2021-11-15 MED ORDER — POLYETHYLENE GLYCOL 3350 17 G PO PACK: 17.0000 g | PACK | Freq: Every day | ORAL | 3 refills | Status: AC

## 2021-11-15 NOTE — Telephone Encounter (Signed)
Patient returned your call, please advise. 

## 2021-11-15 NOTE — Patient Instructions (Addendum)
If you are age 73 or older, your body mass index should be between 23-30. Your Body mass index is 31.33 kg/m. If this is out of the aforementioned range listed, please consider follow up with your Primary Care Provider.  If you are age 7 or younger, your body mass index should be between 19-25. Your Body mass index is 31.33 kg/m. If this is out of the aformentioned range listed, please consider follow up with your Primary Care Provider.   ________________________________________________________  The [facility] providers would like to encourage you to use [facility] to communicate with providers for non-urgent requests or questions.  Due to long hold times on the telephone, sending your provider a message by [facility] may be a faster and more efficient way to get a response.  Please allow 48 business hours for a response.  Please remember that this is for non-urgent requests.  _______________________________________________________ Your provider has requested that you go to the basement level for lab work before leaving today. Press "B" on the elevator. The lab is located at the first door on the left as you exit the elevator.  Due to recent changes in healthcare laws, you may see the results of your imaging and laboratory studies on MyChart before your provider has had a chance to review them.  We understand that in some cases there may be results that are confusing or concerning to you. Not all laboratory results come back in the same time frame and the provider may be waiting for multiple results in order to interpret others.  Please give [place] 48 hours in order for your provider to thoroughly review all the results before contacting the office for clarification of your results.     Will give Cipro and Flagyl  If labs are abnormal will get CT AB and pelvis.  Add on fiber supplement like METAMUCIL/ BENEFIBER 1-2 x a day Miralax once daily Can take the levsin 1-2 x a day for pain if needed.   Can do  heating pad and can take tylenol max of 3000mg  a day.  Can add on lidocaine patches or voltern gel Avoid milk products.  Go to the ER if unable to pass gas, severe AB pain, unable to hold down food, any shortness of breath of chest pain.  Miralax is an osmotic laxative.  It only brings more water into the stool.  This is safe to take daily.  Can take up to 17 gram of miralax twice a day.  Mix with juice or coffee.  Start 1 capful at night for 3-4 days and reassess your response in 3-4 days.  You can increase and decrease the dose based on your response.  Remember, it can take up to 3-4 days to take effect OR for the effects to wear off.   I often pair this with benefiber in the morning to help assure the stool is not too loose.     Diverticulitis Diverticulitis is inflammation or infection of small pouches in your colon that form when you have a condition called diverticulosis. The pouches in your colon are called diverticula. Your colon, or large intestine, is where water is absorbed and stool is formed. Complications of diverticulitis can include: Bleeding. Severe infection. Severe pain. Perforation of your colon. Obstruction of your colon.  What are the causes? Diverticulitis is caused by bacteria. Diverticulitis happens when stool becomes trapped in diverticula. This allows bacteria to grow in the diverticula, which can lead to inflammation and infection. What increases the  risk? People with diverticulosis are at risk for diverticulitis. Eating a diet that does not include enough fiber from fruits and vegetables may make diverticulitis more likely to develop. What are the signs or symptoms? Symptoms of diverticulitis may include: Abdominal pain and tenderness. The pain is normally located on the left side of the abdomen, but may occur in other areas. Fever and chills. Bloating. Cramping. Nausea. Vomiting. Constipation. Diarrhea. Blood in your stool.  How is this  diagnosed? Your health care provider will ask you about your medical history and do a physical exam. You may need to have tests done because many medical conditions can cause the same symptoms as diverticulitis. Tests may include: Blood tests. Urine tests. Imaging tests of the abdomen, including X-rays and CT scans.  When your condition is under control, your health care provider may recommend that you have a colonoscopy. A colonoscopy can show how severe your diverticula are and whether something else is causing your symptoms. How is this treated? Most cases of diverticulitis are mild and can be treated at home. Treatment may include: Taking over-the-counter pain medicines. Following a clear liquid diet. Taking antibiotic medicines by mouth for 7-10 days.  More severe cases may be treated at a hospital. Treatment may include: Not eating or drinking. Taking prescription pain medicine. Receiving antibiotic medicines through an IV tube. Receiving fluids and nutrition through an IV tube. Surgery.  Follow these instructions at home: Follow your health care providers instructions carefully. Follow a full liquid diet or other diet as directed by your health care provider. After your symptoms improve, your health care provider may tell you to change your diet. He or she may recommend you eat a high-fiber diet. Fruits and vegetables are good sources of fiber. Fiber makes it easier to pass stool. Take fiber supplements or probiotics as directed by your health care provider. Only take medicines as directed by your health care provider. Keep all your follow-up appointments. Contact a health care provider if: Your pain does not improve. You have a hard time eating food. Your bowel movements do not return to normal. Get help right away if: Your pain becomes worse. Your symptoms do not get better. Your symptoms suddenly get worse. You have a fever. You have repeated vomiting. You have bloody  or black, tarry stools. This information is not intended to replace advice given to you by your health care provider. Make sure you discuss any questions you have with your health care provider. Document Released: 06/15/2005 Document Revised: 02/11/2016 Document Reviewed: 07/31/2013 Elsevier Interactive Patient Education  2017 [employer].

## 2021-11-16 NOTE — Telephone Encounter (Signed)
Addressed in previously created encounter 

## 2021-11-16 NOTE — Progress Notes (Signed)
Agree with assessment and plan as outlined.  

## 2021-11-17 ENCOUNTER — Other Ambulatory Visit: Payer: Self-pay | Admitting: Allergy and Immunology

## 2021-11-30 ENCOUNTER — Ambulatory Visit: Payer: Medicare Other | Admitting: Allergy and Immunology

## 2021-12-01 ENCOUNTER — Other Ambulatory Visit (HOSPITAL_COMMUNITY): Payer: Self-pay | Admitting: Physician Assistant

## 2021-12-01 ENCOUNTER — Other Ambulatory Visit: Payer: Self-pay | Admitting: Physician Assistant

## 2021-12-01 DIAGNOSIS — Z96652 Presence of left artificial knee joint: Secondary | ICD-10-CM

## 2021-12-13 ENCOUNTER — Ambulatory Visit (INDEPENDENT_AMBULATORY_CARE_PROVIDER_SITE_OTHER): Payer: Medicare PPO

## 2021-12-13 ENCOUNTER — Other Ambulatory Visit: Payer: Self-pay

## 2021-12-13 DIAGNOSIS — J4550 Severe persistent asthma, uncomplicated: Secondary | ICD-10-CM

## 2021-12-17 ENCOUNTER — Encounter (HOSPITAL_COMMUNITY)
Admission: RE | Admit: 2021-12-17 | Discharge: 2021-12-17 | Disposition: A | Discharge status: Home / Self Care | Payer: Medicare PPO | Source: Ambulatory Visit | Attending: Physician Assistant | Admitting: Physician Assistant

## 2021-12-17 ENCOUNTER — Ambulatory Visit (HOSPITAL_COMMUNITY)
Admission: RE | Admit: 2021-12-17 | Discharge: 2021-12-17 | Disposition: A | Discharge status: Home / Self Care | Payer: Medicare PPO | Source: Ambulatory Visit | Attending: Physician Assistant | Admitting: Physician Assistant

## 2021-12-17 DIAGNOSIS — Z96652 Presence of left artificial knee joint: Secondary | ICD-10-CM | POA: Diagnosis present

## 2021-12-17 MED ORDER — TECHNETIUM TC 99M MEDRONATE IV KIT
20.0000 | PACK | Freq: Once | INTRAVENOUS | Status: AC | PRN
  Administered 2021-12-17: 20 via INTRAVENOUS

## 2021-12-22 DIAGNOSIS — Z6831 Body mass index (BMI) 31.0-31.9, adult: Secondary | ICD-10-CM | POA: Diagnosis not present

## 2021-12-22 DIAGNOSIS — M47816 Spondylosis without myelopathy or radiculopathy, lumbar region: Secondary | ICD-10-CM | POA: Diagnosis not present

## 2021-12-23 DIAGNOSIS — F3181 Bipolar II disorder: Secondary | ICD-10-CM | POA: Diagnosis not present

## 2022-01-05 ENCOUNTER — Telehealth: Payer: Self-pay | Admitting: Neurology

## 2022-01-05 NOTE — Telephone Encounter (Signed)
Pt called wanting to speak to the RN regarding not being able to sleep. Pt states that Sat and Tuesday night he was not been able to sleep at all even when he took traZODone (DESYREL) 100 MG tablet and Melatonin 10 mg ?Please advise. ?

## 2022-01-07 ENCOUNTER — Ambulatory Visit (INDEPENDENT_AMBULATORY_CARE_PROVIDER_SITE_OTHER): Payer: Medicare PPO

## 2022-01-07 DIAGNOSIS — J4550 Severe persistent asthma, uncomplicated: Secondary | ICD-10-CM | POA: Diagnosis not present

## 2022-01-07 DIAGNOSIS — M25552 Pain in left hip: Secondary | ICD-10-CM | POA: Diagnosis not present

## 2022-01-07 NOTE — Telephone Encounter (Signed)
Pt would like a call back regarding previous telephone note. ?

## 2022-01-10 ENCOUNTER — Ambulatory Visit: Payer: Medicare PPO

## 2022-01-10 MED ORDER — TRAZODONE HCL 150 MG PO TABS
150.0000 mg | ORAL_TABLET | Freq: Every evening | ORAL | 5 refills | Status: DC | PRN
Start: 2022-01-10 — End: 2022-02-17

## 2022-01-10 NOTE — Telephone Encounter (Signed)
Called the patient back. Advised that Dr Brett Fairy would recommend increasing the trazodone to 150 mg at bedtime. Pt verbalized understanding. ?I will send this to the pharmacy for him. Pt verbalized understanding. ? ?

## 2022-01-10 NOTE — Telephone Encounter (Signed)
Called the patient back. Apologized that he never received a call from [place], it appeared the initial message was not sent. Pt states he continues to have some nights where despite taking the medication he is not sleeping. He currently is on 10 mg Melatonin along with 100 mg trazodone. He has not tried any other sleep aids. When he initially made the increase from 50 to 100 mg he noted benefit but recently has noticed not as affected. Advised I would discuss with dr Dohmeier to see if ok increasing to 150 mg. Pt verbalized understanding and will await call with her recommendations.  ?

## 2022-01-10 NOTE — Addendum Note (Signed)
Addended by: Darleen Crocker on: 01/10/2022 04:24 PM ? ? Modules accepted: Orders ? ?

## 2022-01-13 ENCOUNTER — Ambulatory Visit: Payer: Medicare PPO | Admitting: Physician Assistant

## 2022-01-17 DIAGNOSIS — E349 Endocrine disorder, unspecified: Secondary | ICD-10-CM | POA: Diagnosis not present

## 2022-01-17 DIAGNOSIS — G4730 Sleep apnea, unspecified: Secondary | ICD-10-CM | POA: Diagnosis not present

## 2022-01-17 DIAGNOSIS — F329 Major depressive disorder, single episode, unspecified: Secondary | ICD-10-CM | POA: Diagnosis not present

## 2022-01-17 DIAGNOSIS — G4700 Insomnia, unspecified: Secondary | ICD-10-CM | POA: Diagnosis not present

## 2022-01-17 DIAGNOSIS — E291 Testicular hypofunction: Secondary | ICD-10-CM | POA: Diagnosis not present

## 2022-01-17 DIAGNOSIS — E669 Obesity, unspecified: Secondary | ICD-10-CM | POA: Diagnosis not present

## 2022-01-18 ENCOUNTER — Other Ambulatory Visit: Payer: Self-pay | Admitting: *Deleted

## 2022-01-18 MED ORDER — OMALIZUMAB 150 MG/ML ~~LOC~~ SOSY: 150.0000 mg | PREFILLED_SYRINGE | SUBCUTANEOUS | 11 refills | Status: DC

## 2022-01-19 DIAGNOSIS — M47816 Spondylosis without myelopathy or radiculopathy, lumbar region: Secondary | ICD-10-CM | POA: Diagnosis not present

## 2022-01-19 NOTE — Progress Notes (Signed)
? ? ?01/26/2022 ?Jared [name] ?[id number] ?[birth year] ? ? ?ASSESSMENT AND PLAN:  ? ?Diverticulosis of colon without hemorrhage ?Will call if any symptoms. ?Add on fiber supplement, avoid NSAIDS, information given ? ?Personal history of colonic polyps ?06/01/2021 screening colonoscopy with Dr. Havery Moros showed 6 diminutive adenomatous polyps with diverticulosis recall 3 years  ?Recall 05/2024 ? ?Constipation, unspecified constipation type ?-     polyethylene glycol (MIRALAX) 17 g packet; Take 17 g by mouth daily.- cut back to 1/2 daily.  ?If continuing loose stools may want to get KUB and stools studies with recent ABX.  ?Continue fiber- may want to increase.  ?Likely related to parkinsonism ? ?Chronic kidney disease, stage 3a ([place]) ?Does not take NSAIDS, will avoid repeat CT if possible ? 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 ? ?History of Present Illness:  ?71 y.o. male  with a past medical history of coronary artery disease status post DES stent to RCA 2000, sleep apnea on CPAP, GERD and others listed below, known to Dr. Havery Moros returns to clinic today for follow up of diverticulitis,last seen 11/15/21 in the office.  ? ?Had CT 10/28/2021 diverticulitis and fatty liver, with repeat AB pain, given repeat cipro/flagyl for 14 days.  ?CBC showed no leukocyotisis, no anemia, normal CRP.  ?He did take the cipro/flagyl, has not had anymore AB pain.  ?Constipation treated, added on fiber/miralax- now having BM once a day.  ?1-2 weeks ago had 2-3 days of diarrhea up to 4-5 times a day, no one else sick. No fever, chills.No AB pain.   ?Took imodium and stopped BM for a while but now back to once a day. ?Had 1 episode of loose stool Sunday.  ?On Miralax every other day 1 cupful.   ? ?06/01/2021 screening colonoscopy with Dr. Havery Moros showed 6 diminutive adenomatous polyps with diverticulosis recall 3 years  ? ? ?Previous GI history: ?10/28/2021 CT abdomen pelvis without  contrast ?1. Abnormal mesenteric stranding along the junction of the ?descending and sigmoid colon. There is substantial diverticulosis in ?this region and I would favor diverticulitis over an unrelated ?distal colitis. No extraluminal gas or abscess. ?2. Complete atelectasis of the right middle lobe. I do not see a ?definite central obstructing lesion, but the right middle lobe ?bronchus and its branches appear narrow. A specific cause for the ?right middle lobe atelectasis is not identified on today's ?noncontrast abdomen CT. ?3. Other imaging findings of potential clinical significance: Aortic ?Atherosclerosis (ICD10-I70.0). Coronary atherosclerosis. Diffuse ?hepatic steatosis. Bilateral mild foraminal impingement at L5-S1. ? ?06/01/2021 colonoscopy surveillance due to history of adenomas with Dr. Havery Moros showed 6 diminutive adenomatous polyps and diverticulosis recall 3 years recent screening colonoscopy with Dr. Havery Moros  ?4 adenomas noted 05/2017 ? ?No results found. ? ?Current Medications:  ? ?Current Outpatient Medications (Endocrine & Metabolic):  ?  levothyroxine (SYNTHROID) 25 MCG tablet, Take 25 mcg by mouth every morning. ? ? ?Current Outpatient Medications (Cardiovascular):  ?  amLODipine (NORVASC) 10 MG tablet, Take 10 mg by mouth daily. ?  furosemide (LASIX) 20 MG tablet, Take 20 mg by mouth daily as needed for edema. ?  isosorbide mononitrate (IMDUR) 30 MG 24 hr tablet, take 1 tablet by mouth once daily (Patient taking differently: at bedtime.) ?  PRALUENT 150 MG/ML SOAJ, INJECT 150 MG INTO THE SKIN EVERY 14 DAYS ?  telmisartan (MICARDIS) 20 MG tablet, Take 20 mg by mouth daily. ? ? ?Current Outpatient Medications (Respiratory):  ?  albuterol (VENTOLIN HFA)  108 (90 Base) MCG/ACT inhaler, Can inhale two puffs every four to six hours as needed for cough, wheeze, shortness of breath, or chest tightness. ?  fluticasone (FLOVENT HFA) 110 MCG/ACT inhaler, 2 puffs twice a day with a spacer to prevent  cough or wheeze. Rinse mouth after use. ?  ipratropium-albuterol (DUONEB) 0.5-2.5 (3) MG/3ML SOLN, Take 3 mLs by nebulization every 6 (six) hours as needed (wheezing, coughing, shortness of breath). ?  mometasone-formoterol (DULERA) 200-5 MCG/ACT AERO, Inhale 2 puffs into the lungs 2 (two) times daily. ?  montelukast (SINGULAIR) 10 MG tablet, TAKE 1 TABLET(10 MG) BY MOUTH AT BEDTIME ?  omalizumab [name]) 150 MG/ML prefilled syringe, Inject 150 mg into the skin every 28 (twenty-eight) days. ?  Tiotropium Bromide Monohydrate (SPIRIVA RESPIMAT) 1.25 MCG/ACT AERS, Inhale 2 puffs into the lungs daily. ? ?Current Facility-Administered Medications (Respiratory):  ?  omalizumab [name]) injection 150 mg ? ?Current Outpatient Medications (Analgesics):  ?  aspirin 325 MG EC tablet, Take 325 mg by mouth daily. ? ? ? ? ?Current Outpatient Medications (Other):  ?  ARIPiprazole (ABILIFY) 5 MG tablet, Take 5 mg by mouth every morning. ?  betamethasone dipropionate 0.05 % cream, Apply 1 application topically daily as needed (dry skin). ?  DULoxetine (CYMBALTA) 60 MG capsule, Take 60 mg by mouth every morning. ?  hyoscyamine (LEVSIN) 0.125 MG tablet, Take 1 tablet (0.125 mg total) by mouth every 6 (six) hours as needed for cramping. ?  Multiple Vitamin (MULTIVITAMIN) tablet, Take 1 tablet by mouth at bedtime.  ?  Omega-3 Fatty Acids (FISH OIL) 500 MG CAPS, Take 500 mg by mouth daily. ?  potassium chloride (KLOR-CON) 10 MEQ tablet, Take 10 mEq by mouth daily as needed (low levels). ?  Spacer/Aero-Holding Chambers (AEROCHAMBER PLUS) inhaler, Use as instructed ?  Spacer/Aero-Holding Chambers [facility] DIAMOND) MISC, Use as directed with inhaler. ?  traZODone (DESYREL) 150 MG tablet, Take 1 tablet (150 mg total) by mouth at bedtime as needed for sleep. ?  Vibegron (GEMTESA) 75 MG TABS, Take 1 Dose by mouth daily. ?  vitamin C (ASCORBIC ACID) 500 MG tablet, Take 500 mg by mouth 2 (two) times daily.  ?  VITAMIN D PO, Take 50,000 Units  by mouth once a week. ? ? ?Surgical History:  ?He  has a past surgical history that includes Back surgery; Neck surgery (2005); Coronary stent placement; Femur fracture surgery (Left, 1995); Total knee arthroplasty (Left, 2005); Carpal tunnel release (Bilateral, 2000); Nephrectomy (Left, 1997); Cardiac catheterization (05/10/2007); Cardiac catheterization (04/08/2008); Cardiac catheterization (05/07/2009); Cardiac catheterization (03/18/2010); Cardiovascular stress test (02/09/2012); Anterior cervical decomp/discectomy fusion (N/A, 08/27/2014); Hernia repair; Colonoscopy w/ biopsies and polypectomy; Total knee arthroplasty (Right, 04/13/2018); Transurethral resection of prostate (N/A, 03/25/2021); Colonoscopy; and Colonoscopy with propofol (06/01/2021). ?Family History:  ?His family history includes Alcoholism in his father; Anxiety disorder in his father; Asthma in his mother; Barrett's esophagus in his father; Bone cancer in his father; Cancer in his father and sister; Colon cancer in his maternal uncle; Depression in his father; Diabetes in his sister; Esophageal cancer in his father; Heart attack (age of onset: 30) in his paternal grandfather; Heart disease in his father, mother, and paternal grandfather; Hypertension in his mother; Obesity in his father; Thyroid disease in his mother. ?Social History:  ? reports that he has never smoked. He has never used smokeless tobacco. He reports that he does not currently use alcohol. He reports that he does not use drugs. ? ?Current Medications, Allergies, Past Medical History,  Past Surgical History, Family History and Social History were reviewed in [employer] record. ? ?Physical Exam: ?BP 124/60   Pulse 83   Ht '5\' 7"'$  (1.702 m)   Wt 199 lb (90.3 kg)   BMI 31.17 kg/m?  ?General:   Pleasant, well developed male in no acute distress ?Heart:  regular rate and rhythm ?Pulm: Clear anteriorly; no wheezing ?Abdomen:  Soft, Obese AB, skin exam normal,  Normal bowel sounds without tenderness. With guarding and Without rebound, without hepatomegaly. ?Extremities:  Without edema. Peripheral pulses intact.  ?Neurologic:  Alert and  oriented x4;  flat affe

## 2022-01-21 DIAGNOSIS — N5201 Erectile dysfunction due to arterial insufficiency: Secondary | ICD-10-CM | POA: Diagnosis not present

## 2022-01-21 DIAGNOSIS — N401 Enlarged prostate with lower urinary tract symptoms: Secondary | ICD-10-CM | POA: Diagnosis not present

## 2022-01-21 DIAGNOSIS — C61 Malignant neoplasm of prostate: Secondary | ICD-10-CM | POA: Diagnosis not present

## 2022-01-21 DIAGNOSIS — R350 Frequency of micturition: Secondary | ICD-10-CM | POA: Diagnosis not present

## 2022-01-25 ENCOUNTER — Telehealth: Payer: Self-pay | Admitting: Neurology

## 2022-01-25 DIAGNOSIS — F3181 Bipolar II disorder: Secondary | ICD-10-CM | POA: Diagnosis not present

## 2022-01-25 NOTE — Telephone Encounter (Signed)
Called the patient back. The 150 mg tablet is leaving him feeling groggy in the morning. Advised the pt that it does not come in a 125 mg strength. I questioned if he had stopped melatonin and he has and was still groggy.  He may need to go back to the 100 mg tablet. Pt states he has some 50 mg tablet left over. Advised he could take half tablet of the 150 mg (75 mg) and then a 50 mg tablet to equal 125 mg strength and see if that helps. If not then reduce to 100 mg.  ?

## 2022-01-25 NOTE — Telephone Encounter (Signed)
Pt feels his current dose of traZODone (DESYREL) 150 MG tablet , is too strong, he is tired all the time.  Please call pt to discuss ?

## 2022-01-26 ENCOUNTER — Encounter: Payer: Self-pay | Admitting: Physician Assistant

## 2022-01-26 ENCOUNTER — Ambulatory Visit: Payer: Medicare PPO | Admitting: Physician Assistant

## 2022-01-26 VITALS — BP 124/60 | HR 83 | Ht 67.0 in | Wt 199.0 lb

## 2022-01-26 DIAGNOSIS — N1831 Chronic kidney disease, stage 3a: Secondary | ICD-10-CM | POA: Diagnosis not present

## 2022-01-26 DIAGNOSIS — Z86010 Personal history of colonic polyps: Secondary | ICD-10-CM | POA: Diagnosis not present

## 2022-01-26 DIAGNOSIS — K5900 Constipation, unspecified: Secondary | ICD-10-CM

## 2022-01-26 DIAGNOSIS — K5730 Diverticulosis of large intestine without perforation or abscess without bleeding: Secondary | ICD-10-CM

## 2022-01-26 DIAGNOSIS — G212 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: Secondary | ICD-10-CM

## 2022-01-26 NOTE — Patient Instructions (Addendum)
Diverticulosis ?Diverticulosis is a condition that develops when small pouches (diverticula) form in the wall of the large intestine (colon). The colon is where water is absorbed and stool (feces) is formed. The pouches form when the inside layer of the colon pushes through weak spots in the outer layers of the colon. You may have a few pouches or many of them. ?The pouches usually do not cause problems unless they become inflamed or infected.  ?When this happens, the condition is called diverticulitis- this is left lower quadrant pain, diarrhea, fever, chills, nausea or vomiting.  If this occurs please call the office or go to the hospital. ?Sometimes these patches without inflammation can also have painless bleeding associated with them, if this happens please call the office or go to the hospital. ?Preventing constipation and increasing fiber can help reduce diverticula and prevent complications. ?Try the miralax 1/2 every day and see how you. If you start to have diarrhea consistently call our office and we will get xray of abdomen with stool studies.  ?Even if you feel you have a high-fiber diet, suggest getting on Benefiber/Citracel 2-3 times daily. ?Can use heating pad.  ?Can take IBGARD over the counter 2 tablets twice a day if having pain.  ?Can take tylenol up to '3000mg'$  daily.  ?What increases the risk? ?The following factors may make you more likely to develop this condition: ?Being older than age 50. Your risk for this condition increases with age. Diverticulosis is rare among people younger than age 73. By age 68, many people have it. ?Eating a low-fiber diet. ?Having frequent constipation. ?Being overweight. ?Not getting enough exercise. ?Smoking. ?Taking over-the-counter pain medicines, like aspirin and ibuprofen.- can increase a flare.  ?Having a family history of diverticulosis. ?How is this diagnosed? ?Because diverticulosis usually has no symptoms, it is most often diagnosed during an exam for  other colon problems. The condition may be diagnosed by: ?Using a flexible scope to examine the colon (colonoscopy). ?Taking an X-ray of the colon after dye has been put into the colon (barium enema). ?Having a CT scan. ?How is this treated? ?You may not need treatment for this condition. Your health care provider may recommend treatment to prevent problems. You may need treatment if you have symptoms or if you previously had diverticulitis. Treatment may include: ?Eating a high-fiber diet. ?Taking a fiber supplement. ?Taking a live bacteria supplement (probiotic). ?Taking medicine to relax your colon. ?Contact a health care provider if you: ?Have pain in your abdomen. ?Have bloating. ?Have cramps. ?Have not had a bowel movement in 3 days. ?Get help right away if: ?Your pain gets worse. ?Your bloating becomes very bad. ?You have a fever or chills, and your symptoms suddenly get worse. ?You vomit. ?You have bowel movements that are bloody or black. ?You have bleeding from your rectum. ?Summary ?Diverticulosis is a condition that develops when small pouches (diverticula) form in the wall of the large intestine (colon). ?You may have a few pouches or many of them. ?This condition is most often diagnosed during an exam for other colon problems. ?Treatment may include increasing the fiber in your diet, taking supplements, or taking medicines. ?This information is not intended to replace advice given to you by your health care provider. Make sure you discuss any questions you have with your health care provider. ?Document Revised: 04/04/2019 Document Reviewed: 04/04/2019 ?Elsevier Patient Education ? [place]. ? ?

## 2022-01-27 NOTE — Progress Notes (Signed)
Agree with assessment and plan as outlined.  

## 2022-02-03 ENCOUNTER — Ambulatory Visit (INDEPENDENT_AMBULATORY_CARE_PROVIDER_SITE_OTHER): Payer: Medicare PPO

## 2022-02-03 DIAGNOSIS — J4550 Severe persistent asthma, uncomplicated: Secondary | ICD-10-CM | POA: Diagnosis not present

## 2022-02-04 ENCOUNTER — Ambulatory Visit: Payer: Medicare PPO

## 2022-02-04 DIAGNOSIS — L570 Actinic keratosis: Secondary | ICD-10-CM | POA: Diagnosis not present

## 2022-02-04 DIAGNOSIS — D485 Neoplasm of uncertain behavior of skin: Secondary | ICD-10-CM | POA: Diagnosis not present

## 2022-02-11 DIAGNOSIS — Z6829 Body mass index (BMI) 29.0-29.9, adult: Secondary | ICD-10-CM | POA: Diagnosis not present

## 2022-02-11 DIAGNOSIS — M47816 Spondylosis without myelopathy or radiculopathy, lumbar region: Secondary | ICD-10-CM | POA: Diagnosis not present

## 2022-02-16 NOTE — Progress Notes (Unsigned)
PATIENT: Jared [name] DOB: October 09, 1950  REASON FOR VISIT: follow up HISTORY FROM: patient  No chief complaint on file.   HISTORY OF PRESENT ILLNESS:  02/16/22 ALL:  Jared [name] returns for follow up. Jared [name] was last seen by me 04/2021 and reported concerns for worsening left sided resting tremor and imbalance. Jared [name] had taken Abilify for several years and previously diagnosed with neuroleptic induced parkinsonism. Jared [name] requested DaT scan due to worsening symptoms. Jared [name] was seen by Dr Brett Fairy 10/13/2021. Normal DaT scan results reviewed. She advised continue follow up with psychiatry and compliance with CPAP. Jared [name] called 12/2021 with worsening insomnia. Trazodone was increased to '150mg'$ . Jared [name] called about a month later reporting daytime grogginess with increased dose and advised to reduce dose to 100-'125mg'$  daily. Jared [name] is followed closely by psychiatry.   05/04/2021 ALL:  Jared [name] returns for follow up for tremor and OSA on CPAP. Jared [name] is doing fairly well on CPAP therapy. Jared [name] uses his machine most nights but admits that sometimes Jared [name] forgets. Jared [name] denies concerns with CPAP machine or supplies.   Jared [name] feels that tremor is worse. Jared [name] has noted left hand tremor at rest. Jared [name] notices this when watching TV but endorses worsening with activity or anxiety. Jared [name] also feels that Jared [name] has had changes in how Jared [name] walks. Jared [name] feels that his feet move faster than his body. Jared [name] feels the sensation that Jared [name] is leaning forward. Jared [name] has had one fall since last being seen and reports this being due to slipping down a hill while trying to pick tomatoes. Jared [name] does not use assistive device. Jared [name] does not usually feel off balance. Jared [name] denies difficult eating or swallowing. No changes in memory. Jared [name] continues to have a difficult time sleeping. Jared [name] was restarted on trazodone but unsure if it is helping.   Jared [name] continues to see psychiatry every 4-6 weeks. Jared [name] reports not taking Abilify. Jared [name] can't remember how long ago this was stopped. Jared [name] reported taking it in 10/2020 but now  feels that Jared [name] has been off this medication for about a year.   Jared [name] was scheduled for follow up with Dr Brett Fairy to discuss this concern but returned to my schedule in her absence. Jared [name] is taking duloxetine '60mg'$  daily. Jared [name] does not think psychiatry has added any new medications since last being seen. Jared [name] reports PCP has changed GERD medications but unsure what Jared [name] is taking. Jared [name] does not think Jared [name] is on Nexium or Pepcid.     10/27/2020 ALL:  Jared [name] returns today for follow up for OSA on CPAP, headaches and parkinsonism on Ability.  Jared [name] is followed by Dr. Deirdre Evener, psychiatry. At last follow-up 1 year ago, Jared [name] reported headaches had resolved after stopping duloxetine.  Jared [name] reports that duloxetine 30 mg was restarted last year for worsening depression. Headaches have not returned.   Jared [name] called our office yesterday to schedule CPAP follow-up and to report concerns of difficulty staying asleep. Jared [name] does not feel rested in the mornings and tends to lie in bed longer than Jared [name] would like. Jared [name] feels that depression is well managed. Jared [name] usually goes to bed around the same time every night. Jared [name] feels that Jared [name] tosses and turns all night then does not feel motivated to get up the next day. Jared [name] may lay in bed for 10-12 hours. Jared [name] continues to act out dreams. Jared [name] often wakes himself up thinking that Jared [name] has poured medications in his hand to take but nothing is there. His wife continues to note that Jared [name]  talks and has random movements in his sleep. Jared [name] reports that psychiatry asked him to discuss sleep concerns with [place]. Jared [name] was on Belsomra '20mg'$ , written by psychiatry, at last visit but is uncertain why this was stopped. Jared [name] reports hallucinations in the past with Ambien. Trazodone was ineffective. Jared [name] has never taken clonazepam. 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   Compliance report dated 09/26/2020 through 10/25/2020 reveals that Jared [name] used CPAP 29 of the past 30 days for compliance of 97%.  Jared [name] is CPAP greater than 4  hours 20 of the past 30 days for compliance of 67%.  Average usage on days used was 6 hours and 6 minutes.  Residual AHI was 1.0 on 6 to 16 cm of water.  There was no significant leak noted.   10/17/2019 ALL:  Jared [name] is a 71 y.o. 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 Dr Jannifer Franklin increased duloxetine to '60mg'$  twice daily for headaches. Headaches continued and Jared [name] reports that his PCP stopped duloxetine. Since, headaches have resolved. Jared [name] continues to have intermittent shaking of left arm. Shaking can with activity or at rest. May happen several times a day or may not happen for a day or two. Shaking lasts about 2-3 minutes then resolves. Jared [name] has been on Ability for 6-7 years, followed by Dr Darryl Lent, psychiatry. Tremor started shortly afterwards. She feels that Jared [name] is doing well overall and not bothered by shaking. Jared [name] does not feel symptoms are worsening.   She was previously on CPAP therapy but needed a new CPAP machine. New machine delivered in 09/23/2019. Jared [name] continued to use his old CPAP until 10/06/2019. Jared [name] is doing well with new CPAP machine. Jared [name] feels that it works well and Jared [name] is feeling better. Compliance report does reveal consistent use since 10/06/2019. Residual AHI 2.4 on 6-16cmH20. No significant leak noted.   HISTORY: (copied from Dr Edwena Felty note on 08/28/2019)  Jared [name] is a 71 y.o. year old White or Caucasian male patient seen for a sleep consultation- the patient's primary neurologist is Dr Jannifer Franklin.  referral on 08/28/2019 from PCP  for a new evaluation.   Chief concern according to patient :  I am getting more and more fatigued, CPAP machine is 74 years old.    I have the pleasure of seeing Jared [name] today, a right-handed White or Caucasian male with a possible sleep disorder.  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  Patient reports headaches have improved, sleepiness is progressing.  Dr Jannifer Franklin started w treatment of severe depression with Cymbalta. Once a day and Jared [name] sleeps better when taking Hydralizine and Belsomra with Metta Clines.      Jared [name] was seen for a baseline diagnostic polysomnography on January 14, 2014 referred by Dr. Burnard Bunting and at the time less sleepy than today with the Epworth Sleepiness Scale endorsed at 9 out of 24 points.  Sleep efficiency was poor 70.6% and highly fragmented.  The patient had multiple hypopneas rather than apneas 66 respiratory events consisted of 55 shallow breathing spells 1  obstructive apnea and 4 central apneas and 6 mixed apneas with an AHI of 12.9.  The lowest oxygen saturation was 77% at nadir with 41 minutes of desaturation in total.  Jared [name] also had frequent periodic limb movements at the time Jared [name] also moved during REM sleep indicative of REM sleep behavior disorder.  Jared [name] returned for CPAP titration on 06 Feb 2014 his AHI was reduced significantly and his periodic limb movements also improved.  Nadir was now 82% with only 2 minutes of desaturation time.  Jared [name] started on 10 cmH2O pressure with 3 cm flex or air sense and a nasal pillow.  Normal sinus rhythm by EKG was no injury was noted.   Dr. Jannifer Franklin:  08-10-2019 History of present illness:   Jared [name] is a 71 year old right-handed white male with a history of renal cell carcinoma in the past, history of coronary artery disease, asthma, sleep apnea on CPAP, and  history of depression on medications to include Abilify.  The patient claims that in early August Jared [name] began having headaches in the right frontotemporal region.  Jared [name] has never had any problems with headaches at any time in his life, headaches are very unusual for him.  The patient has noted that the headaches may last 30 minutes to an hour and a half, and may come and go up to 3 times a day, sometimes waking him up out of sleep.  The patient has no nausea, vomiting or photophobia or phonophobia with the headache.  Jared [name] denies any vision changes or neck pain.  Jared [name] may take Tylenol with good improvement of the headache.  The headaches are usually a 5 or 6 out of 10 on a pain scale and are described as a dull achy pain.  The patient reports no sinus drainage or allergy symptoms.  Jared [name] denies any numbness or weakness of the face, arms, legs.  Jared [name] has noted recently some problems with tremor that will come and go involving the left upper extremity.  Jared [name] denies issues controlling the bowels or the bladder.  Jared [name] was sent to this office for an evaluation.  Jared [name] recently had MRI of the brain that was relatively unremarkable, no source of the headache was seen.  The headaches are always on the right frontal temporal area, never anywhere else.  Jared [name] does not get any redness of the eye or tearing of the eye with a headache.  The patient was placed on Lamisil around the time of onset of headache, but Jared [name] ran out of his medication a week ago and Jared [name] has continued to have headaches.   REVIEW OF SYSTEMS: Out of a complete 14 system review of symptoms, the patient complains only of the following symptoms, tremor left arm, headaches, depression, parasomnia, insomnia and all other reviewed systems are negative.  ESS: 8   ALLERGIES: Allergies  Allergen Reactions   Contrast Media [Iodinated Contrast Media] Other (See Comments)    Was told not to take d/t pt only having 1 kidney   Nsaids Other (See Comments)    Was told not to take d/t pt  only having 1 kidney   Penicillins Hives, Itching and Other (See Comments)    Has patient had a PCN reaction causing immediate rash, facial/tongue/throat swelling, SOB or lightheadedness with hypotension: Unknown Has patient had a PCN reaction causing severe rash involving mucus membranes or skin necrosis: Unknown Has patient had a PCN reaction that required hospitalization: Unknown Has patient had a PCN reaction occurring within the last 10 years:  No If all of the above answers are "NO", then may proceed with Cephalosporin use.    Sulfonamide Derivatives Hives and Itching   Codeine Nausea Only   Statins Other (See Comments)    Myalgias    HOME MEDICATIONS: Outpatient Medications Prior to Visit  Medication Sig Dispense Refill   albuterol (VENTOLIN HFA) 108 (90 Base) MCG/ACT inhaler Can inhale two puffs every four to six hours as needed for cough, wheeze, shortness of breath, or chest tightness. 18 g 1   amLODipine (NORVASC) 10 MG tablet Take 10 mg by mouth daily.     ARIPiprazole (ABILIFY) 5 MG tablet Take 5 mg by mouth every morning.     aspirin 325 MG EC tablet Take 325 mg by mouth daily.     betamethasone dipropionate 0.05 % cream Apply 1 application topically daily as needed (dry skin).     DULoxetine (CYMBALTA) 60 MG capsule Take 60 mg by mouth every morning.     fluticasone (FLOVENT HFA) 110 MCG/ACT inhaler 2 puffs twice a day with a spacer to prevent cough or wheeze. Rinse mouth after use. 1 each 5   furosemide (LASIX) 20 MG tablet Take 20 mg by mouth daily as needed for edema.     hyoscyamine (LEVSIN) 0.125 MG tablet Take 1 tablet (0.125 mg total) by mouth every 6 (six) hours as needed for cramping. 30 tablet 0   ipratropium-albuterol (DUONEB) 0.5-2.5 (3) MG/3ML SOLN Take 3 mLs by nebulization every 6 (six) hours as needed (wheezing, coughing, shortness of breath). 360 mL 1   isosorbide mononitrate (IMDUR) 30 MG 24 hr tablet take 1 tablet by mouth once daily (Patient taking  differently: at bedtime.) 15 tablet 0   levothyroxine (SYNTHROID) 25 MCG tablet Take 25 mcg by mouth every morning.     mometasone-formoterol (DULERA) 200-5 MCG/ACT AERO Inhale 2 puffs into the lungs 2 (two) times daily.     montelukast (SINGULAIR) 10 MG tablet TAKE 1 TABLET(10 MG) BY MOUTH AT BEDTIME 90 tablet 0   Multiple Vitamin (MULTIVITAMIN) tablet Take 1 tablet by mouth at bedtime.      omalizumab [name]) 150 MG/ML prefilled syringe Inject 150 mg into the skin every 28 (twenty-eight) days. 1 mL 11   Omega-3 Fatty Acids (FISH OIL) 500 MG CAPS Take 500 mg by mouth daily.     potassium chloride (KLOR-CON) 10 MEQ tablet Take 10 mEq by mouth daily as needed (low levels).     PRALUENT 150 MG/ML SOAJ INJECT 150 MG INTO THE SKIN EVERY 14 DAYS 2 mL 11   Spacer/Aero-Holding Chambers (AEROCHAMBER PLUS) inhaler Use as instructed 1 each 2   Spacer/Aero-Holding Chambers (OPTICHAMBER DIAMOND) MISC Use as directed with inhaler. 1 each 0   telmisartan (MICARDIS) 20 MG tablet Take 20 mg by mouth daily.     Tiotropium Bromide Monohydrate (SPIRIVA RESPIMAT) 1.25 MCG/ACT AERS Inhale 2 puffs into the lungs daily.     traZODone (DESYREL) 150 MG tablet Take 1 tablet (150 mg total) by mouth at bedtime as needed for sleep. 30 tablet 5   Vibegron (GEMTESA) 75 MG TABS Take 1 Dose by mouth daily.     vitamin C (ASCORBIC ACID) 500 MG tablet Take 500 mg by mouth 2 (two) times daily.      VITAMIN D PO Take 50,000 Units by mouth once a week.     Facility-Administered Medications Prior to Visit  Medication Dose Route Frequency Provider Last Rate Last Admin   omalizumab [name]) injection 150 mg  150 mg Subcutaneous Q28 days Dara Hoyer, FNP   150 mg at 02/03/22 1132    PAST MEDICAL HISTORY: Past Medical History:  Diagnosis Date   Anxiety    Asthma    Cervical vertebral fusion 05/02/2014   Now presenting with C5-6 radiculopathy   Coronary artery disease    COVID 01/2020   pain all over monoclonal antibodies  given all symptoms resolved   GERD (gastroesophageal reflux disease)    H/O heart artery stent 2000   to right rca   Heart attack ([place]) 2009   History of depression 03/19/2021   History of esophageal stricture    History of spinal fracture 2015   Hyperlipidemia    Hypertension    Hypothyroidism    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[place])    left arm tremor   Osteoarthritis    lower back   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   Renal cell carcinoma 1997   Left kidney   Sleep apnea    wears CPAP   Sleep talking    Snoring 12/25/2013   Wears glasses     PAST SURGICAL HISTORY: Past Surgical History:  Procedure Laterality Date   ANTERIOR CERVICAL DECOMP/DISCECTOMY FUSION N/A 08/27/2014   Procedure: Cervical six-seven anterior cervical decompression fusion with removal of hardware at Cervical five-six;  Surgeon: Erline Levine, MD;  Location: [facility];  Service: Neurosurgery;  Laterality: N/A;  Cervical six-seven anterior cervical decompression fusion with removal of hardware at Cervical five-six   BACK SURGERY     1992, 2013 lower back   CARDIAC CATHETERIZATION  05/10/2007   Minimal CAD, normal LV systolic function, medical management   CARDIAC CATHETERIZATION  04/08/2008   RCA ulcerated 70-80% stenosis, stented with a 3x42m Endeavor stent at 13atm for 50sec, reduced from 80% ulcerated stenosis to 0%.   CARDIAC CATHETERIZATION  05/07/2009   50% distal left main disease-IVUS or flow wire too dangerous in particular setting to perform intervention.   CARDIAC CATHETERIZATION  03/18/2010   Medical management   CARDIOVASCULAR STRESS TEST  02/09/2012   Normal, no significant wall abnormalities noted   CARPAL TUNNEL RELEASE Bilateral 2000   COLONOSCOPY     COLONOSCOPY W/ BIOPSIES AND POLYPECTOMY     COLONOSCOPY WITH PROPOFOL  06/01/2021   [facility]    as infant groin    NECK SURGERY  2005   NEPHRECTOMY Left 1997   secondary to cancer   TOTAL KNEE ARTHROPLASTY Left 2005   TOTAL KNEE ARTHROPLASTY Right 04/13/2018   Procedure: RIGHT TOTAL KNEE ARTHROPLASTY;  Surgeon: NNetta Cedars MD;  [facility]  Service: Orthopedics;  Laterality: Right;   TRANSURETHRAL RESECTION OF PROSTATE N/A 03/25/2021   Procedure: TRANSURETHRAL RESECTION OF THE PROSTATE (TURP);  Surgeon: DFranchot Gallo MD;  [facility]  Service: Urology;  Laterality: N/A;    FAMILY HISTORY: Family History  Problem Relation Age of Onset   Asthma Mother    Heart disease Mother    Hypertension Mother    Thyroid disease Mother    Esophageal cancer Father    Barrett's esophagus Father    Bone cancer Father        mets from esophagus   Heart disease Father    Cancer Father    Depression Father    Anxiety disorder Father    Alcoholism Father    Obesity  Father    Diabetes Sister    Cancer Sister        Cervical cancer   Colon cancer Maternal Uncle    Heart disease Paternal Grandfather    Heart attack Paternal Grandfather 51   Stomach cancer Neg Hx    Colon polyps Neg Hx    Rectal cancer Neg Hx     SOCIAL HISTORY: Social History   Socioeconomic History   Marital status: Married    Spouse name: Not on file   Number of children: 1   Years of education: 14   Highest education level: Not on file  Occupational History   Occupation: OWNER. Landscape Design/horticulture    Employer: [employer]   Occupation: OWNER    Employer: [employer]    Comment: retired  Tobacco Use   Smoking status: Never   Smokeless tobacco: Never  Vaping Use   Vaping Use: Never used  Substance and Sexual Activity   Alcohol use: Not Currently   Drug use: No   Sexual activity: Not Currently  Other Topics Concern   Not on file  Social History Narrative   Patient is married [name]).   Patient drinks one cup of coffee but not everyday.   Patient has one child.    Patient has a college education.   Patient is right-handed.   Social Determinants of Health   Financial Resource Strain: Not on file  Food Insecurity: Not on file  Transportation Needs: Not on file  Physical Activity: Not on file  Stress: Not on file  Social Connections: Not on file  Intimate Partner Violence: Not on file      PHYSICAL EXAM  There were no vitals filed for this visit.   There is no height or weight on file to calculate BMI.  Generalized: Well developed, in no acute distress, masked face Cardiology: normal rate and rhythm, no murmur noted Respiratory: clear to auscultation bilaterally  Neurological examination  Mentation: Alert oriented to time, place, history taking. Follows all commands speech and language fluent Cranial nerve II-XII: Pupils were equal round reactive to light. Extraocular movements were full, visual field were full on confrontational test. Facial sensation and strength were normal. Head turning and shoulder shrug  were normal and symmetric. Motor: The motor testing reveals 5 over 5 strength of all 4 extremities. Good symmetric motor tone is noted throughout. Intermittent resting tremor of left hand noted during exam, very mild postural tremor of bilateral hands and moderate action tremor noted of left hand with coordination testing. No obvious cogwheel rigidity. No bradykinesia noted.  Sensory: Sensory testing is intact to soft touch on all 4 extremities. No evidence of extinction is noted.  Coordination: Cerebellar testing reveals good finger-nose-finger and heel-to-shin bilaterally.  Gait and station: Able to push to standing position. Posture is very slightly stooped. Gait is narrow but stable without assistive device. Slight decrease in arm swing    DIAGNOSTIC DATA (LABS, IMAGING, TESTING) - I reviewed patient records, labs, notes, testing and imaging myself where available.      View : No data to display.           Lab Results   Component Value Date   WBC 8.1 11/15/2021   HGB 12.7 (L) 11/15/2021   HCT 38.9 (L) 11/15/2021   MCV 80.4 11/15/2021   PLT 344.0 11/15/2021      Component Value Date/Time   NA 138 11/15/2021 0950   NA 139 02/02/2021 1127   K 4.2 11/15/2021 0950  CL 103 11/15/2021 0950   CO2 26 11/15/2021 0950   GLUCOSE 111 (H) 11/15/2021 0950   BUN 18 11/15/2021 0950   BUN 27 02/02/2021 1127   CREATININE 1.71 (H) 11/15/2021 0950   CREATININE 1.41 (H) 06/25/2013 1051   CALCIUM 9.4 11/15/2021 0950   PROT 8.1 11/15/2021 0950   PROT 7.4 05/14/2021 1158   ALBUMIN 4.2 11/15/2021 0950   ALBUMIN 4.4 05/14/2021 1158   AST 14 11/15/2021 0950   ALT 14 11/15/2021 0950   ALKPHOS 66 11/15/2021 0950   BILITOT 0.4 11/15/2021 0950   BILITOT 0.3 05/14/2021 1158   GFRNONAA 48 (L) 06/05/2021 0330   GFRAA 56 (L) 10/28/2020 1127   Lab Results  Component Value Date   CHOL 152 05/14/2021   HDL 36 (L) 05/14/2021   LDLCALC 77 05/14/2021   TRIG 237 (H) 05/14/2021   CHOLHDL 4.2 05/14/2021   Lab Results  Component Value Date   HGBA1C 6.1 (H) 02/02/2021   Lab Results  Component Value Date   [id number] 903 06/02/2021   Lab Results  Component Value Date   TSH 2.775 06/02/2021     ASSESSMENT AND PLAN 71 y.o. 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 here with   No diagnosis found.    Jared [name] reports concerns of worsening left hand tremor and feels that Jared [name] is more  off balance, recently. Jared [name] reports discontinuation of Abilify but unable to tell me how long ago this was, reportedly taking in 10/2020. Jared [name] has been on Abilify for 4-5 years. Jared [name] is questioning concerns for primary parkinson's. Neuro exam with some findings consistent with parkinsonian syndrome. Jared [name] has never had a formal DaT scan and requests that we order this for evaluation. Jared [name] continues to tolerate CPAP therapy. Compliance report shows acceptable daily and 4 hour compliance. Jared [name] was encouraged to continue using CPAP nightly and for 4 hours every night. Jared [name] does continue to have difficulty with sleep. Now back on trazodone managed by psychiatry. Will also send to Dr Brett Fairy for review. Jared [name] will continue close follow up with care team. Jared [name] verbalizes understanding and agreement with this plan.    No orders of the defined types were placed in this encounter.     No orders of the defined types were placed in this encounter.     I spent 30 minutes with the patient. 50% of this time was spent counseling and educating patient on plan of care and medications.     Debbora Presto, FNP-C 02/16/2022, 5:12 PM [facility] [address], [place], [place] [address] [phone]

## 2022-02-16 NOTE — Patient Instructions (Signed)
Below is our plan:  We will continue trazodone 50mg  at bedtime. Continue close follow up with psychiatry and primary care.   Please continue using your CPAP regularly. While your insurance requires that you use CPAP at least 4 hours each night on 70% of the nights, I recommend, that you not skip any nights and use it throughout the night if you can. Getting used to CPAP and staying with the treatment long term does take time and patience and discipline. Untreated obstructive sleep apnea when it is moderate to severe can have an adverse impact on cardiovascular health and raise her risk for heart disease, arrhythmias, hypertension, congestive heart failure, stroke and diabetes. Untreated obstructive sleep apnea causes sleep disruption, nonrestorative sleep, and sleep deprivation. This can have an impact on your day to day functioning and cause daytime sleepiness and impairment of cognitive function, memory loss, mood disturbance, and problems focussing. Using CPAP regularly can improve these symptoms.  Please make sure you are staying well hydrated. I recommend 50-60 ounces daily. Well balanced diet and regular exercise encouraged. Consistent sleep schedule with 6-8 hours recommended.   Please continue follow up with care team as directed.   Follow up with 1 in year   You may receive a survey regarding today's visit. I encourage you to leave honest feed back as I do use this information to improve patient care. Thank you for seeing me today!

## 2022-02-17 ENCOUNTER — Ambulatory Visit: Payer: Medicare PPO | Admitting: Family Medicine

## 2022-02-17 ENCOUNTER — Encounter: Payer: Self-pay | Admitting: Family Medicine

## 2022-02-17 VITALS — BP 113/63 | HR 75 | Ht 67.0 in | Wt 198.5 lb

## 2022-02-17 DIAGNOSIS — G4733 Obstructive sleep apnea (adult) (pediatric): Secondary | ICD-10-CM

## 2022-02-17 DIAGNOSIS — Z9989 Dependence on other enabling machines and devices: Secondary | ICD-10-CM | POA: Diagnosis not present

## 2022-02-17 DIAGNOSIS — G212 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: Secondary | ICD-10-CM

## 2022-02-17 DIAGNOSIS — R259 Unspecified abnormal involuntary movements: Secondary | ICD-10-CM

## 2022-02-17 MED ORDER — TRAZODONE HCL 50 MG PO TABS: 50.0000 mg | ORAL_TABLET | Freq: Every day | ORAL | 3 refills | Status: DC

## 2022-02-28 ENCOUNTER — Emergency Department (HOSPITAL_BASED_OUTPATIENT_CLINIC_OR_DEPARTMENT_OTHER): Payer: Medicare PPO

## 2022-02-28 ENCOUNTER — Telehealth: Payer: Self-pay | Admitting: Gastroenterology

## 2022-02-28 ENCOUNTER — Inpatient Hospital Stay (HOSPITAL_BASED_OUTPATIENT_CLINIC_OR_DEPARTMENT_OTHER)
Admission: EM | Admit: 2022-02-28 | Discharge: 2022-03-03 | DRG: 392 | Disposition: A | Discharge status: Home / Self Care | Payer: Medicare PPO | Attending: Internal Medicine | Admitting: Internal Medicine

## 2022-02-28 ENCOUNTER — Encounter (HOSPITAL_BASED_OUTPATIENT_CLINIC_OR_DEPARTMENT_OTHER): Payer: Self-pay | Admitting: *Deleted

## 2022-02-28 ENCOUNTER — Telehealth: Payer: Self-pay | Admitting: Physician Assistant

## 2022-02-28 ENCOUNTER — Other Ambulatory Visit: Payer: Self-pay

## 2022-02-28 DIAGNOSIS — Z79899 Other long term (current) drug therapy: Secondary | ICD-10-CM

## 2022-02-28 DIAGNOSIS — E782 Mixed hyperlipidemia: Secondary | ICD-10-CM | POA: Diagnosis present

## 2022-02-28 DIAGNOSIS — I252 Old myocardial infarction: Secondary | ICD-10-CM

## 2022-02-28 DIAGNOSIS — Z91041 Radiographic dye allergy status: Secondary | ICD-10-CM

## 2022-02-28 DIAGNOSIS — I700 Atherosclerosis of aorta: Secondary | ICD-10-CM | POA: Diagnosis present

## 2022-02-28 DIAGNOSIS — K5732 Diverticulitis of large intestine without perforation or abscess without bleeding: Principal | ICD-10-CM | POA: Diagnosis present

## 2022-02-28 DIAGNOSIS — Z885 Allergy status to narcotic agent status: Secondary | ICD-10-CM | POA: Diagnosis not present

## 2022-02-28 DIAGNOSIS — F32A Depression, unspecified: Secondary | ICD-10-CM | POA: Diagnosis present

## 2022-02-28 DIAGNOSIS — Z6372 Alcoholism and drug addiction in family: Secondary | ICD-10-CM | POA: Diagnosis not present

## 2022-02-28 DIAGNOSIS — Z96653 Presence of artificial knee joint, bilateral: Secondary | ICD-10-CM | POA: Diagnosis present

## 2022-02-28 DIAGNOSIS — Z9989 Dependence on other enabling machines and devices: Secondary | ICD-10-CM

## 2022-02-28 DIAGNOSIS — G20 Parkinson's disease: Secondary | ICD-10-CM | POA: Diagnosis present

## 2022-02-28 DIAGNOSIS — Z955 Presence of coronary angioplasty implant and graft: Secondary | ICD-10-CM

## 2022-02-28 DIAGNOSIS — Z85528 Personal history of other malignant neoplasm of kidney: Secondary | ICD-10-CM

## 2022-02-28 DIAGNOSIS — Z8349 Family history of other endocrine, nutritional and metabolic diseases: Secondary | ICD-10-CM

## 2022-02-28 DIAGNOSIS — Z833 Family history of diabetes mellitus: Secondary | ICD-10-CM

## 2022-02-28 DIAGNOSIS — M1990 Unspecified osteoarthritis, unspecified site: Secondary | ICD-10-CM | POA: Diagnosis present

## 2022-02-28 DIAGNOSIS — K760 Fatty (change of) liver, not elsewhere classified: Secondary | ICD-10-CM | POA: Diagnosis present

## 2022-02-28 DIAGNOSIS — Z8616 Personal history of COVID-19: Secondary | ICD-10-CM | POA: Diagnosis not present

## 2022-02-28 DIAGNOSIS — R739 Hyperglycemia, unspecified: Secondary | ICD-10-CM | POA: Diagnosis present

## 2022-02-28 DIAGNOSIS — I2510 Atherosclerotic heart disease of native coronary artery without angina pectoris: Secondary | ICD-10-CM | POA: Diagnosis present

## 2022-02-28 DIAGNOSIS — J4550 Severe persistent asthma, uncomplicated: Secondary | ICD-10-CM | POA: Diagnosis present

## 2022-02-28 DIAGNOSIS — G2111 Neuroleptic induced parkinsonism: Secondary | ICD-10-CM | POA: Diagnosis present

## 2022-02-28 DIAGNOSIS — Z800 Family history of malignant neoplasm of digestive organs: Secondary | ICD-10-CM

## 2022-02-28 DIAGNOSIS — T43505A Adverse effect of unspecified antipsychotics and neuroleptics, initial encounter: Secondary | ICD-10-CM | POA: Diagnosis present

## 2022-02-28 DIAGNOSIS — Z882 Allergy status to sulfonamides status: Secondary | ICD-10-CM

## 2022-02-28 DIAGNOSIS — N400 Enlarged prostate without lower urinary tract symptoms: Secondary | ICD-10-CM | POA: Diagnosis present

## 2022-02-28 DIAGNOSIS — N1831 Chronic kidney disease, stage 3a: Secondary | ICD-10-CM | POA: Diagnosis present

## 2022-02-28 DIAGNOSIS — Z886 Allergy status to analgesic agent status: Secondary | ICD-10-CM

## 2022-02-28 DIAGNOSIS — K219 Gastro-esophageal reflux disease without esophagitis: Secondary | ICD-10-CM | POA: Diagnosis present

## 2022-02-28 DIAGNOSIS — K5730 Diverticulosis of large intestine without perforation or abscess without bleeding: Secondary | ICD-10-CM | POA: Diagnosis not present

## 2022-02-28 DIAGNOSIS — Z8249 Family history of ischemic heart disease and other diseases of the circulatory system: Secondary | ICD-10-CM

## 2022-02-28 DIAGNOSIS — I10 Essential (primary) hypertension: Secondary | ICD-10-CM | POA: Diagnosis present

## 2022-02-28 DIAGNOSIS — K5792 Diverticulitis of intestine, part unspecified, without perforation or abscess without bleeding: Principal | ICD-10-CM | POA: Diagnosis present

## 2022-02-28 DIAGNOSIS — G4733 Obstructive sleep apnea (adult) (pediatric): Secondary | ICD-10-CM | POA: Diagnosis present

## 2022-02-28 DIAGNOSIS — E039 Hypothyroidism, unspecified: Secondary | ICD-10-CM | POA: Diagnosis present

## 2022-02-28 DIAGNOSIS — Z79890 Hormone replacement therapy (postmenopausal): Secondary | ICD-10-CM | POA: Diagnosis not present

## 2022-02-28 DIAGNOSIS — Z7982 Long term (current) use of aspirin: Secondary | ICD-10-CM | POA: Diagnosis not present

## 2022-02-28 DIAGNOSIS — Z888 Allergy status to other drugs, medicaments and biological substances status: Secondary | ICD-10-CM

## 2022-02-28 DIAGNOSIS — Z825 Family history of asthma and other chronic lower respiratory diseases: Secondary | ICD-10-CM

## 2022-02-28 DIAGNOSIS — Z811 Family history of alcohol abuse and dependence: Secondary | ICD-10-CM

## 2022-02-28 DIAGNOSIS — Z981 Arthrodesis status: Secondary | ICD-10-CM

## 2022-02-28 DIAGNOSIS — G20C Parkinsonism, unspecified: Secondary | ICD-10-CM | POA: Diagnosis present

## 2022-02-28 DIAGNOSIS — I129 Hypertensive chronic kidney disease with stage 1 through stage 4 chronic kidney disease, or unspecified chronic kidney disease: Secondary | ICD-10-CM | POA: Diagnosis present

## 2022-02-28 DIAGNOSIS — Z818 Family history of other mental and behavioral disorders: Secondary | ICD-10-CM

## 2022-02-28 LAB — CBC
HCT: 43 % (ref 39.0–52.0)
Hemoglobin: 13.8 g/dL (ref 13.0–17.0)
MCH: 26.4 pg (ref 26.0–34.0)
MCHC: 32.1 g/dL (ref 30.0–36.0)
MCV: 82.4 fL (ref 80.0–100.0)
Platelets: 363 10*3/uL (ref 150–400)
RBC: 5.22 MIL/uL (ref 4.22–5.81)
RDW: 15.8 % — ABNORMAL HIGH (ref 11.5–15.5)
WBC: 21.4 10*3/uL — ABNORMAL HIGH (ref 4.0–10.5)
nRBC: 0 % (ref 0.0–0.2)

## 2022-02-28 LAB — COMPREHENSIVE METABOLIC PANEL
ALT: 25 U/L (ref 0–44)
AST: 17 U/L (ref 15–41)
Albumin: 4.6 g/dL (ref 3.5–5.0)
Alkaline Phosphatase: 84 U/L (ref 38–126)
Anion gap: 14 (ref 5–15)
BUN: 13 mg/dL (ref 8–23)
CO2: 23 mmol/L (ref 22–32)
Calcium: 10.1 mg/dL (ref 8.9–10.3)
Chloride: 101 mmol/L (ref 98–111)
Creatinine, Ser: 1.23 mg/dL (ref 0.61–1.24)
GFR, Estimated: >60 mL/min (ref >60)
Glucose, Bld: 161 mg/dL — ABNORMAL HIGH (ref 70–99)
Potassium: 4.1 mmol/L (ref 3.5–5.1)
Sodium: 138 mmol/L (ref 135–145)
Total Bilirubin: 0.4 mg/dL (ref 0.3–1.2)
Total Protein: 8.3 g/dL — ABNORMAL HIGH (ref 6.5–8.1)

## 2022-02-28 LAB — LIPASE, BLOOD: Lipase: <10 U/L — ABNORMAL LOW (ref 11–51)

## 2022-02-28 MED ORDER — MORPHINE SULFATE (PF) 4 MG/ML IV SOLN
4.0000 mg | Freq: Once | INTRAVENOUS | Status: AC
  Administered 2022-02-28: 4 mg via INTRAVENOUS
  Filled 2022-02-28: qty 1

## 2022-02-28 MED ORDER — HYDROMORPHONE HCL 1 MG/ML IJ SOLN
1.0000 mg | Freq: Once | INTRAMUSCULAR | Status: AC
  Administered 2022-02-28: 1 mg via INTRAVENOUS
  Filled 2022-02-28: qty 1

## 2022-02-28 MED ORDER — ONDANSETRON HCL 4 MG/2ML IJ SOLN
4.0000 mg | Freq: Once | INTRAMUSCULAR | Status: AC
  Administered 2022-02-28: 4 mg via INTRAVENOUS
  Filled 2022-02-28: qty 2

## 2022-02-28 MED ORDER — HYDROMORPHONE HCL 1 MG/ML IJ SOLN
1.0000 mg | INTRAMUSCULAR | Status: DC | PRN
  Administered 2022-02-28 – 2022-03-01 (×5): 1 mg via INTRAVENOUS
  Filled 2022-02-28 (×5): qty 1

## 2022-02-28 MED ORDER — SODIUM CHLORIDE 0.9 % IV SOLN
2.0000 g | Freq: Once | INTRAVENOUS | Status: AC
  Administered 2022-02-28: 2 g via INTRAVENOUS
  Filled 2022-02-28: qty 12.5

## 2022-02-28 MED ORDER — SODIUM CHLORIDE 0.9 % IV BOLUS
1000.0000 mL | Freq: Once | INTRAVENOUS | Status: AC
  Administered 2022-02-28: 1000 mL via INTRAVENOUS

## 2022-02-28 MED ORDER — METRONIDAZOLE 500 MG/100ML IV SOLN
500.0000 mg | Freq: Once | INTRAVENOUS | Status: AC
  Administered 2022-02-28: 500 mg via INTRAVENOUS
  Filled 2022-02-28: qty 100

## 2022-02-28 NOTE — ED Notes (Addendum)
Pt is aware of the need for urine and stated that he doesn't need to go yet.

## 2022-02-28 NOTE — Telephone Encounter (Signed)
Inbound call from patient stating that he is having severe abd pain and rectum pain and is seeking advice from nurse. Please advise.

## 2022-02-28 NOTE — ED Notes (Signed)
Following pain med administration, patient placed on 2LNC d/t SpO2 85%. Pt encouraged on taking slow, deep breaths.

## 2022-02-28 NOTE — Telephone Encounter (Signed)
Page received to on call. Patient developed acute onset R>L lower abdominal pain and rectal pain around 9:00 AM and has persisted throughout the day.  No change with APAP. Feels similar to prior diverticulitis, but pain is worse. No fever. Did have vomiting x2 2/2 pain. Pain seems to be increasing.   While this could be diverticulitis, given the increasing pain and sxs worse than prior, discussed possibility of complicated diverticulitis, abscess, perforation, etc. I strongly recommended he go to the ER for exepdited evaluation, to include labs, probable CT, pain control, and likely Abx depending on evaluation. He understands and will have someone drive him to the ER at [facility]. All questions answered and appreciative for phone call. Will have GI team f/u with him by phone in the AM.

## 2022-02-28 NOTE — ED Triage Notes (Signed)
Pt with hx of diverticulitis is here for lower abdominal pain and rectal pain that began at 8am today.  Pt is concerned about diverticulitis.  No fever or chills with this.  Pt has had some nausea and vomiting x 2 today.  Last BM was today

## 2022-02-28 NOTE — ED Provider Notes (Signed)
[facility] EMERGENCY DEPT Provider Note   CSN: [id number] Arrival date & time: 02/28/22  1846     History  Chief Complaint  Patient presents with   Abdominal Pain    Jared [name] is a 71 y.o. male.  Pt is a 71 yo male with a pmhx significant for diverticulitis, htn, renal cell carcinoma s/p left nephrectomy, GERD, CAD, OA, sleep apnea, hypothyroidism, neuroleptic induced parkinsonism, and depression.  Pt has been having severe right and left lower quadrant abd pain since around 0800.  No fever.  He's had vomiting times 2 today.  He feels like this is like prior attacks of diverticulitis, but this is worse.       Home Medications Prior to Admission medications   Medication Sig Start Date End Date Taking? Authorizing Provider  albuterol (VENTOLIN HFA) 108 (90 Base) MCG/ACT inhaler Can inhale two puffs every four to six hours as needed for cough, wheeze, shortness of breath, or chest tightness. 05/25/21   Kozlow, Donnamarie Poag, MD  amLODipine (NORVASC) 10 MG tablet Take 10 mg by mouth daily.    [provider]  ARIPiprazole (ABILIFY) 5 MG tablet Take 5 mg by mouth every morning. 05/21/20   [provider]  aspirin 325 MG EC tablet Take 325 mg by mouth daily.    [provider]  betamethasone dipropionate 0.05 % cream Apply 1 application topically daily as needed (dry skin). 05/26/21   [provider]  DULoxetine (CYMBALTA) 60 MG capsule Take 60 mg by mouth every morning. 04/22/21   [provider]  fluticasone (FLOVENT HFA) 110 MCG/ACT inhaler 2 puffs twice a day with a spacer to prevent cough or wheeze. Rinse mouth after use. 05/25/21   Kozlow, Donnamarie Poag, MD  furosemide (LASIX) 20 MG tablet Take 20 mg by mouth daily as needed for edema.    [provider]  hyoscyamine (LEVSIN) 0.125 MG tablet Take 1 tablet (0.125 mg total) by mouth every 6 (six) hours as needed for cramping. 11/15/21   Vladimir Crofts, PA-C  ipratropium-albuterol  (DUONEB) 0.5-2.5 (3) MG/3ML SOLN Take 3 mLs by nebulization every 6 (six) hours as needed (wheezing, coughing, shortness of breath). 05/25/21   Kozlow, Donnamarie Poag, MD  isosorbide mononitrate (IMDUR) 30 MG 24 hr tablet take 1 tablet by mouth once daily Patient taking differently: at bedtime. 06/21/17   Lorretta Harp, MD  levothyroxine (SYNTHROID) 25 MCG tablet Take 25 mcg by mouth every morning. 03/30/21   [provider]  mometasone-formoterol (DULERA) 200-5 MCG/ACT AERO Inhale 2 puffs into the lungs 2 (two) times daily.    [provider]  montelukast (SINGULAIR) 10 MG tablet TAKE 1 TABLET(10 MG) BY MOUTH AT BEDTIME 11/17/21   Kozlow, Donnamarie Poag, MD  Multiple Vitamin (MULTIVITAMIN) tablet Take 1 tablet by mouth at bedtime.     [provider]  omalizumab [name]) 150 MG/ML prefilled syringe Inject 150 mg into the skin every 28 (twenty-eight) days. 01/18/22   Kozlow, Donnamarie Poag, MD  Omega-3 Fatty Acids (FISH OIL) 500 MG CAPS Take 500 mg by mouth daily.    [provider]  potassium chloride (KLOR-CON) 10 MEQ tablet Take 10 mEq by mouth daily as needed (low levels).    [provider]  PRALUENT 150 MG/ML SOAJ INJECT 150 MG INTO THE SKIN EVERY 14 DAYS 09/02/21   Lorretta Harp, MD  Spacer/Aero-Holding Chambers (AEROCHAMBER PLUS) inhaler Use as instructed 05/25/21   Kozlow, Donnamarie Poag, MD  Spacer/Aero-Holding Josiah Lobo (  OPTICHAMBER DIAMOND) MISC Use as directed with inhaler. 07/20/20   Garnet Sierras, DO  telmisartan (MICARDIS) 20 MG tablet Take 20 mg by mouth daily. 02/20/20   [provider]  Tiotropium Bromide Monohydrate (SPIRIVA RESPIMAT) 1.25 MCG/ACT AERS Inhale 2 puffs into the lungs daily.    [provider]  traZODone (DESYREL) 50 MG tablet Take 1 tablet (50 mg total) by mouth at bedtime. 02/17/22   Lomax, [name], NP  Vibegron (GEMTESA) 75 MG TABS Take 1 Dose by mouth daily.    [provider]  vitamin C (ASCORBIC ACID) 500 MG tablet Take 500 mg by  mouth 2 (two) times daily.     [provider]  VITAMIN D PO Take 50,000 Units by mouth once a week.    [provider]      Allergies    Contrast media [iodinated contrast media], Nsaids, Penicillins, Sulfonamide derivatives, Codeine, and Statins    Review of Systems   Review of Systems  Gastrointestinal:  Positive for abdominal pain, nausea and vomiting.  All other systems reviewed and are negative.   Physical Exam Updated Vital Signs BP (!) 163/79 (BP Location: Right Arm)   Pulse 92   Temp 98.5 F (36.9 C) (Oral)   Resp 18   Wt 90.3 kg   SpO2 91%   BMI 31.17 kg/m  Physical Exam Vitals and nursing note reviewed.  Constitutional:      Appearance: He is well-developed.  HENT:     Head: Normocephalic and atraumatic.     Mouth/Throat:     Mouth: Mucous membranes are dry.     Pharynx: Oropharynx is clear.  Cardiovascular:     Rate and Rhythm: Normal rate and regular rhythm.  Pulmonary:     Effort: Pulmonary effort is normal.     Breath sounds: Normal breath sounds.  Abdominal:     General: Abdomen is flat. Bowel sounds are normal.     Palpations: Abdomen is soft.     Tenderness: There is abdominal tenderness in the right lower quadrant and left lower quadrant.  Skin:    General: Skin is warm.     Capillary Refill: Capillary refill takes less than 2 seconds.  Neurological:     General: No focal deficit present.     Mental Status: He is alert and oriented to person, place, and time.  Psychiatric:        Mood and Affect: Mood normal.        Behavior: Behavior normal.     ED Results / Procedures / Treatments   Labs (all labs ordered are listed, but only abnormal results are displayed) Labs Reviewed  LIPASE, BLOOD - Abnormal; Notable for the following components:      Result Value   Lipase <10 (*)    All other components within normal limits  COMPREHENSIVE METABOLIC PANEL - Abnormal; Notable for the following components:   Glucose, Bld 161 (*)     Total Protein 8.3 (*)    All other components within normal limits  CBC - Abnormal; Notable for the following components:   WBC 21.4 (*)    RDW 15.8 (*)    All other components within normal limits  URINALYSIS, ROUTINE W REFLEX MICROSCOPIC    EKG None  Radiology CT ABDOMEN PELVIS WO CONTRAST  Result Date: 02/28/2022 CLINICAL DATA:  Severe left lower quadrant pain EXAM: CT ABDOMEN AND PELVIS WITHOUT CONTRAST TECHNIQUE: Multidetector CT imaging of the abdomen and pelvis was performed following the  standard protocol without IV contrast. RADIATION DOSE REDUCTION: This exam was performed according to the departmental dose-optimization program which includes automated exposure control, adjustment of the mA and/or kV according to patient size and/or use of iterative reconstruction technique. COMPARISON:  10/28/2021 FINDINGS: Lower chest: No acute abnormality Hepatobiliary: Diffuse low-density throughout the liver compatible with fatty infiltration. Scattered hepatic calcifications. No suspicious focal abnormality. Gallbladder unremarkable. Pancreas: No focal abnormality or ductal dilatation. Spleen: No focal abnormality.  Normal size. Adrenals/Urinary Tract: Prior left nephrectomy. Stable cysts within the right kidney. No stones or hydronephrosis. Adrenal glands and urinary bladder unremarkable. Stomach/Bowel: Colonic diverticulosis, most pronounced in the left colon. Stranding around the distal descending and proximal sigmoid colon suggesting acute diverticulitis. Stomach and small bowel decompressed. Vascular/Lymphatic: Aortic atherosclerosis. No evidence of aneurysm or adenopathy. Reproductive: No visible focal abnormality. Other: No free fluid or free air. Musculoskeletal: No acute bony abnormality. IMPRESSION: Colonic diverticulosis with stranding around the distal descending colon and proximal sigmoid colon concerning for active diverticulitis. Hepatic steatosis. Aortic atherosclerosis.  Electronically Signed   By: Rolm Baptise M.D.   On: 02/28/2022 20:04    Procedures Procedures    Medications Ordered in ED Medications  morphine (PF) 4 MG/ML injection 4 mg (4 mg Intravenous Given 02/28/22 1939)  ondansetron (ZOFRAN) injection 4 mg (4 mg Intravenous Given 02/28/22 1938)  sodium chloride 0.9 % bolus 1,000 mL (0 mLs Intravenous Stopped 02/28/22 2123)  morphine (PF) 4 MG/ML injection 4 mg (4 mg Intravenous Given 02/28/22 2015)  ceFEPIme (MAXIPIME) 2 g in sodium chloride 0.9 % 100 mL IVPB (0 g Intravenous Stopped 02/28/22 2123)    And  metroNIDAZOLE (FLAGYL) IVPB 500 mg (0 mg Intravenous Stopped 02/28/22 2123)  HYDROmorphone (DILAUDID) injection 1 mg (1 mg Intravenous Given 02/28/22 2104)    ED Course/ Medical Decision Making/ A&P                           Medical Decision Making Amount and/or Complexity of Data Reviewed Labs: ordered. Radiology: ordered.  Risk Prescription drug management. Decision regarding hospitalization.   This patient presents to the ED for concern of abd pain, this involves an extensive number of treatment options, and is a complaint that carries with it a high risk of complications and morbidity.  The differential diagnosis includes diverticulitis, appendicitis, uti, infection, kidney stone   Co morbidities that complicate the patient evaluation  diverticulitis, htn, renal cell carcinoma s/p left nephrectomy, GERD, CAD, OA, sleep apnea, hypothyroidism, neuroleptic induced parkinsonism, and depression   Additional history obtained:  Additional history obtained from epic chart review External records from outside source obtained and reviewed including wife   Lab Tests:  I Ordered, and personally interpreted labs.  The pertinent results include:  wbc elevated at 21.4, lip <10; cmp with mild elevation in bs at 161   Imaging Studies ordered:  I ordered imaging studies including ct abd/pelvis  I independently visualized and interpreted  imaging which showed  IMPRESSION:  Colonic diverticulosis with stranding around the distal descending  colon and proximal sigmoid colon concerning for active  diverticulitis.    Hepatic steatosis.    Aortic atherosclerosis.   I agree with the radiologist interpretation   Cardiac Monitoring:  The patient was maintained on a cardiac monitor.  I personally viewed and interpreted the cardiac monitored which showed an underlying rhythm of: nsr   Medicines ordered and prescription drug management:  I ordered medication including morphine and zofran and  fluids  for pain and nausea and dehydration  Reevaluation of the patient after these medicines showed that the patient improved I have reviewed the patients home medicines and have made adjustments as needed   Test Considered:  ct   Critical Interventions:  Pain control/abx   Consultations Obtained:  I requested consultation with the hospitalist (Dr. Alcario Drought),  and discussed lab and imaging findings as well as pertinent plan - he will admit   Problem List / ED Course:  Acute diverticulitis:  pain is not well controlled after 8 mg of morphine.  IV dilaudid given. Pt given IV flagyl and maxipime for the diverticulitis.  Pt d/w hospitalist for admission due to pain not being under control.   Reevaluation:  After the interventions noted above, I reevaluated the patient and found that they have :improved   Social Determinants of Health:  Lives at home with wife   Dispostion:  After consideration of the diagnostic results and the patients response to treatment, I feel that the patent would benefit from admission.          Final Clinical Impression(s) / ED Diagnoses Final diagnoses:  Diverticulitis    Rx / DC Orders ED Discharge Orders     None         Isla Pence, MD 02/28/22 2131

## 2022-03-01 DIAGNOSIS — Z85528 Personal history of other malignant neoplasm of kidney: Secondary | ICD-10-CM | POA: Diagnosis not present

## 2022-03-01 DIAGNOSIS — Z79899 Other long term (current) drug therapy: Secondary | ICD-10-CM | POA: Diagnosis not present

## 2022-03-01 DIAGNOSIS — T43505A Adverse effect of unspecified antipsychotics and neuroleptics, initial encounter: Secondary | ICD-10-CM | POA: Diagnosis present

## 2022-03-01 DIAGNOSIS — I129 Hypertensive chronic kidney disease with stage 1 through stage 4 chronic kidney disease, or unspecified chronic kidney disease: Secondary | ICD-10-CM | POA: Diagnosis present

## 2022-03-01 DIAGNOSIS — I2510 Atherosclerotic heart disease of native coronary artery without angina pectoris: Secondary | ICD-10-CM | POA: Diagnosis present

## 2022-03-01 DIAGNOSIS — E782 Mixed hyperlipidemia: Secondary | ICD-10-CM | POA: Diagnosis present

## 2022-03-01 DIAGNOSIS — Z7982 Long term (current) use of aspirin: Secondary | ICD-10-CM | POA: Diagnosis not present

## 2022-03-01 DIAGNOSIS — G2111 Neuroleptic induced parkinsonism: Secondary | ICD-10-CM | POA: Diagnosis present

## 2022-03-01 DIAGNOSIS — F32A Depression, unspecified: Secondary | ICD-10-CM | POA: Diagnosis present

## 2022-03-01 DIAGNOSIS — Z8616 Personal history of COVID-19: Secondary | ICD-10-CM | POA: Diagnosis not present

## 2022-03-01 DIAGNOSIS — I700 Atherosclerosis of aorta: Secondary | ICD-10-CM | POA: Diagnosis present

## 2022-03-01 DIAGNOSIS — R739 Hyperglycemia, unspecified: Secondary | ICD-10-CM

## 2022-03-01 DIAGNOSIS — E039 Hypothyroidism, unspecified: Secondary | ICD-10-CM | POA: Diagnosis present

## 2022-03-01 DIAGNOSIS — N400 Enlarged prostate without lower urinary tract symptoms: Secondary | ICD-10-CM | POA: Diagnosis present

## 2022-03-01 DIAGNOSIS — Z885 Allergy status to narcotic agent status: Secondary | ICD-10-CM | POA: Diagnosis not present

## 2022-03-01 DIAGNOSIS — K5732 Diverticulitis of large intestine without perforation or abscess without bleeding: Secondary | ICD-10-CM | POA: Diagnosis present

## 2022-03-01 DIAGNOSIS — K5792 Diverticulitis of intestine, part unspecified, without perforation or abscess without bleeding: Secondary | ICD-10-CM | POA: Diagnosis present

## 2022-03-01 DIAGNOSIS — Z79890 Hormone replacement therapy (postmenopausal): Secondary | ICD-10-CM | POA: Diagnosis not present

## 2022-03-01 DIAGNOSIS — G4733 Obstructive sleep apnea (adult) (pediatric): Secondary | ICD-10-CM | POA: Diagnosis present

## 2022-03-01 DIAGNOSIS — K219 Gastro-esophageal reflux disease without esophagitis: Secondary | ICD-10-CM | POA: Diagnosis present

## 2022-03-01 DIAGNOSIS — J4550 Severe persistent asthma, uncomplicated: Secondary | ICD-10-CM | POA: Diagnosis present

## 2022-03-01 DIAGNOSIS — N1831 Chronic kidney disease, stage 3a: Secondary | ICD-10-CM | POA: Diagnosis present

## 2022-03-01 DIAGNOSIS — Z6372 Alcoholism and drug addiction in family: Secondary | ICD-10-CM | POA: Diagnosis not present

## 2022-03-01 DIAGNOSIS — M1990 Unspecified osteoarthritis, unspecified site: Secondary | ICD-10-CM | POA: Diagnosis present

## 2022-03-01 DIAGNOSIS — K760 Fatty (change of) liver, not elsewhere classified: Secondary | ICD-10-CM | POA: Diagnosis present

## 2022-03-01 LAB — URINALYSIS, ROUTINE W REFLEX MICROSCOPIC
Bilirubin Urine: NEGATIVE
Glucose, UA: NEGATIVE mg/dL
Hgb urine dipstick: NEGATIVE
Nitrite: NEGATIVE
Protein, ur: 100 mg/dL — AB
Specific Gravity, Urine: 1.03 (ref 1.005–1.030)
pH: 5.5 (ref 5.0–8.0)

## 2022-03-01 LAB — HEMOGLOBIN A1C
Hgb A1c MFr Bld: 6.5 % — ABNORMAL HIGH (ref 4.8–5.6)
Mean Plasma Glucose: 139.85 mg/dL

## 2022-03-01 MED ORDER — ACETAMINOPHEN 325 MG PO TABS
650.0000 mg | ORAL_TABLET | Freq: Four times a day (QID) | ORAL | Status: DC | PRN
  Administered 2022-03-02 – 2022-03-03 (×3): 650 mg via ORAL
  Filled 2022-03-01 (×3): qty 2

## 2022-03-01 MED ORDER — METRONIDAZOLE 500 MG/100ML IV SOLN
500.0000 mg | Freq: Two times a day (BID) | INTRAVENOUS | Status: DC
  Administered 2022-03-01 – 2022-03-02 (×4): 500 mg via INTRAVENOUS
  Filled 2022-03-01 (×4): qty 100

## 2022-03-01 MED ORDER — ENOXAPARIN SODIUM 40 MG/0.4ML IJ SOSY
40.0000 mg | PREFILLED_SYRINGE | INTRAMUSCULAR | Status: DC
  Administered 2022-03-01 – 2022-03-02 (×2): 40 mg via SUBCUTANEOUS
  Filled 2022-03-01 (×2): qty 0.4

## 2022-03-01 MED ORDER — OXYCODONE HCL 5 MG PO TABS
5.0000 mg | ORAL_TABLET | ORAL | Status: DC | PRN
  Administered 2022-03-01: 5 mg via ORAL
  Filled 2022-03-01: qty 1

## 2022-03-01 MED ORDER — PROCHLORPERAZINE EDISYLATE 10 MG/2ML IJ SOLN: 10.0000 mg | Freq: Four times a day (QID) | INTRAMUSCULAR | Status: DC | PRN

## 2022-03-01 MED ORDER — METOPROLOL TARTRATE 5 MG/5ML IV SOLN: 5.0000 mg | Freq: Four times a day (QID) | INTRAVENOUS | Status: DC | PRN

## 2022-03-01 MED ORDER — SODIUM CHLORIDE 0.9 % IV SOLN
2.0000 g | Freq: Two times a day (BID) | INTRAVENOUS | Status: DC
  Administered 2022-03-01 – 2022-03-03 (×4): 2 g via INTRAVENOUS
  Filled 2022-03-01 (×5): qty 12.5

## 2022-03-01 MED ORDER — HYDROMORPHONE HCL 1 MG/ML IJ SOLN
1.0000 mg | INTRAMUSCULAR | Status: DC | PRN
  Administered 2022-03-01: 1 mg via INTRAVENOUS
  Filled 2022-03-01: qty 1

## 2022-03-01 MED ORDER — LACTATED RINGERS IV SOLN: INTRAVENOUS | Status: DC

## 2022-03-01 MED ORDER — ACETAMINOPHEN 650 MG RE SUPP: 650.0000 mg | Freq: Four times a day (QID) | RECTAL | Status: DC | PRN

## 2022-03-01 NOTE — H&P (Signed)
History and Physical    Patient: Jared [name] KDT:[id number] DOB: [birth year] DOA: 02/28/2022 DOS: the patient was seen and examined on 03/01/2022 PCP: Burnard Bunting, MD  Patient coming from: Home  Chief Complaint:  Chief Complaint  Patient presents with   Abdominal Pain   HPI: Jared [name] is a 71 y.o. male with medical history significant of neuroleptic induced parkinonism, diverticulosis, CKD3a, CAD s/p PCI, OSA on CPAP, GERD, HTN, hypothyroidism, asthma. Presenting with abdominal pain. Symptoms started 2 days about with sharp, stabbing pain in the lower abdomen and rectum. He had N/V. He didn't have any fever or diarrhea. He didn't try any meds to help. When his symptoms did not improve yesterday, he decided to go to the ED for assistance. He denies any Jared aggravating or alleviating factors.    Review of Systems: As mentioned in the history of present illness. All Jared systems reviewed and are negative. Past Medical History:  Diagnosis Date   Anxiety    Asthma    Cervical vertebral fusion 05/02/2014   Now presenting with C5-6 radiculopathy   Coronary artery disease    COVID 01/2020   pain all over monoclonal antibodies given all symptoms resolved   GERD (gastroesophageal reflux disease)    H/O heart artery stent 2000   to right rca   Heart attack ([place]) 2009   History of depression 03/19/2021   History of esophageal stricture    History of spinal fracture 2015   Hyperlipidemia    Hypertension    Hypothyroidism    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[place])    left arm tremor   Osteoarthritis    lower back   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   Renal cell carcinoma 1997   Left kidney   Sleep apnea    wears CPAP   Sleep talking    Snoring 12/25/2013   Wears glasses    Past Surgical History:  Procedure Laterality Date   ANTERIOR CERVICAL DECOMP/DISCECTOMY FUSION N/A 08/27/2014   Procedure: Cervical six-seven anterior cervical decompression  fusion with removal of hardware at Cervical five-six;  Surgeon: Erline Levine, MD;  Location: [facility];  Service: Neurosurgery;  Laterality: N/A;  Cervical six-seven anterior cervical decompression fusion with removal of hardware at Cervical five-six   BACK SURGERY     1992, 2013 lower back   CARDIAC CATHETERIZATION  05/10/2007   Minimal CAD, normal LV systolic function, medical management   CARDIAC CATHETERIZATION  04/08/2008   RCA ulcerated 70-80% stenosis, stented with a 3x11m Endeavor stent at 13atm for 50sec, reduced from 80% ulcerated stenosis to 0%.   CARDIAC CATHETERIZATION  05/07/2009   50% distal left main disease-IVUS or flow wire too dangerous in particular setting to perform intervention.   CARDIAC CATHETERIZATION  03/18/2010   Medical management   CARDIOVASCULAR STRESS TEST  02/09/2012   Normal, no significant wall abnormalities noted   CARPAL TUNNEL RELEASE Bilateral 2000   COLONOSCOPY     COLONOSCOPY W/ BIOPSIES AND POLYPECTOMY     [place] [place]   NECK SURGERY  2005   NEPHRECTOMY Left 1997   secondary to cancer   TOTAL KNEE ARTHROPLASTY Left 2005   TOTAL KNEE ARTHROPLASTY Right 04/13/2018   Procedure: RIGHT TOTAL KNEE ARTHROPLASTY;  Surgeon: NNetta Cedars MD;  [place]  Service: [place]opedics;  Laterality: Right;   TRANSURETHRAL RESECTION OF PROSTATE N/A 03/25/2021   Procedure: TRANSURETHRAL RESECTION OF THE PROSTATE (TURP);  Surgeon: Franchot Gallo, MD;  Location: [facility];  Service: Urology;  Laterality: N/A;   Social History:  reports that he has never smoked. He has never used smokeless tobacco. He reports that he does not currently use alcohol. He reports that he does not use drugs.  Allergies  Allergen Reactions   Contrast Media [Iodinated Contrast Media] Jared (See Comments)     Was told not to take d/t pt only having 1 kidney   Nsaids Jared (See Comments)    Was told not to take d/t pt only having 1 kidney   Penicillins Hives, Itching and Jared (See Comments)    Has patient had a PCN reaction causing immediate rash, facial/tongue/throat swelling, SOB or lightheadedness with hypotension: Unknown Has patient had a PCN reaction causing severe rash involving mucus membranes or skin necrosis: Unknown Has patient had a PCN reaction that required hospitalization: Unknown Has patient had a PCN reaction occurring within the last 10 years: No If all of the above answers are "NO", then may proceed with Cephalosporin use.    Sulfonamide Derivatives Hives and Itching   Codeine Nausea Only   Statins Jared (See Comments)    Myalgias    Family History  Problem Relation Age of Onset   Asthma Mother    Heart disease Mother    Hypertension Mother    Thyroid disease Mother    Esophageal cancer Father    Barrett's esophagus Father    Bone cancer Father        mets from esophagus   Heart disease Father    Cancer Father    Depression Father    Anxiety disorder Father    Alcoholism Father    Obesity Father    Diabetes Sister    Cancer Sister        Cervical cancer   Colon cancer Maternal Uncle    Heart disease Paternal Grandfather    Heart attack Paternal Grandfather 79   Stomach cancer Neg Hx    Colon polyps Neg Hx    Rectal cancer Neg Hx     Prior to Admission medications   Medication Sig Start Date End Date Taking? Authorizing Provider  albuterol (VENTOLIN HFA) 108 (90 Base) MCG/ACT inhaler Can inhale two puffs every four to six hours as needed for cough, wheeze, shortness of breath, or chest tightness. 05/25/21   Kozlow, Donnamarie Poag, MD  amLODipine (NORVASC) 10 MG tablet Take 10 mg by mouth daily.    [provider]  ARIPiprazole (ABILIFY) 5 MG tablet Take 5 mg by mouth every morning. 05/21/20   [provider]  aspirin 325 MG EC tablet Take 325 mg by  mouth daily.    [provider]  betamethasone dipropionate 0.05 % cream Apply 1 application topically daily as needed (dry skin). 05/26/21   [provider]  DULoxetine (CYMBALTA) 60 MG capsule Take 60 mg by mouth every morning. 04/22/21   [provider]  fluticasone (FLOVENT HFA) 110 MCG/ACT inhaler 2 puffs twice a day with a spacer to prevent cough or wheeze. Rinse mouth after use. 05/25/21   Kozlow, Donnamarie Poag, MD  furosemide (LASIX) 20 MG tablet Take 20 mg by mouth daily as needed for edema.    [provider]  hyoscyamine (LEVSIN) 0.125 MG tablet Take 1 tablet (0.125 mg total) by mouth every 6 (six)  hours as needed for cramping. 11/15/21   Vladimir Crofts, PA-C  ipratropium-albuterol (DUONEB) 0.5-2.5 (3) MG/3ML SOLN Take 3 mLs by nebulization every 6 (six) hours as needed (wheezing, coughing, shortness of breath). 05/25/21   Kozlow, Donnamarie Poag, MD  isosorbide mononitrate (IMDUR) 30 MG 24 hr tablet take 1 tablet by mouth once daily Patient taking differently: at bedtime. 06/21/17   Lorretta Harp, MD  levothyroxine (SYNTHROID) 25 MCG tablet Take 25 mcg by mouth every morning. 03/30/21   [provider]  mometasone-formoterol (DULERA) 200-5 MCG/ACT AERO Inhale 2 puffs into the lungs 2 (two) times daily.    [provider]  montelukast (SINGULAIR) 10 MG tablet TAKE 1 TABLET(10 MG) BY MOUTH AT BEDTIME 11/17/21   Kozlow, Donnamarie Poag, MD  Multiple Vitamin (MULTIVITAMIN) tablet Take 1 tablet by mouth at bedtime.     [provider]  omalizumab [name]) 150 MG/ML prefilled syringe Inject 150 mg into the skin every 28 (twenty-eight) days. 01/18/22   Kozlow, Donnamarie Poag, MD  Omega-3 Fatty Acids (FISH OIL) 500 MG CAPS Take 500 mg by mouth daily.    [provider]  potassium chloride (KLOR-CON) 10 MEQ tablet Take 10 mEq by mouth daily as needed (low levels).    [provider]  PRALUENT 150 MG/ML SOAJ INJECT 150 MG INTO THE SKIN EVERY 14 DAYS 09/02/21    Lorretta Harp, MD  Spacer/Aero-Holding Chambers (AEROCHAMBER PLUS) inhaler Use as instructed 05/25/21   Kozlow, Donnamarie Poag, MD  Spacer/Aero-Holding Chambers [facility] DIAMOND) MISC Use as directed with inhaler. 07/20/20   Garnet Sierras, DO  telmisartan (MICARDIS) 20 MG tablet Take 20 mg by mouth daily. 02/20/20   [provider]  Tiotropium Bromide Monohydrate (SPIRIVA RESPIMAT) 1.25 MCG/ACT AERS Inhale 2 puffs into the lungs daily.    [provider]  traZODone (DESYREL) 50 MG tablet Take 1 tablet (50 mg total) by mouth at bedtime. 02/17/22   Lomax, [name], NP  Vibegron (GEMTESA) 75 MG TABS Take 1 Dose by mouth daily.    [provider]  vitamin C (ASCORBIC ACID) 500 MG tablet Take 500 mg by mouth 2 (two) times daily.     [provider]  VITAMIN D PO Take 50,000 Units by mouth once a week.    [provider]    Physical Exam: Vitals:   03/01/22 0730 03/01/22 0800 03/01/22 0815 03/01/22 0830  BP: 139/78 124/79  (!) 149/70  Pulse: 87 85 92 91  Resp: 16   19  Temp:    98.7 F (37.1 C)  TempSrc:    Oral  SpO2: 94% 95% 97% 97%  Weight:       General: 71 y.o. male resting in bed in NAD Eyes: PERRL, normal sclera ENMT: Nares patent w/o discharge, orophaynx clear, dentition normal, ears w/o discharge/lesions/ulcers Neck: Supple, trachea midline Cardiovascular: RRR, +S1, S2, no m/g/r, equal pulses throughout Respiratory: CTABL, no w/r/r, normal WOB GI: BS+, mild distentio, but soft, TTP LLQ, no masses noted, no organomegaly noted MSK: No e/c/c Neuro: A&O x 3, no focal deficits Psyc: Appropriate interaction and affect, calm/cooperative  Data Reviewed:  Na+  138 K+  4.1 Glucose  161 BUN  13 Scr  1.23 Lipase  <10 WBC  21.4 Hgb  13.8  CT ab/pelvis w/o Colonic diverticulosis with stranding around the distal descending colon and proximal sigmoid colon concerning for active diverticulitis. Hepatic steatosis. Aortic atherosclerosis.  Assessment  and Plan: Acute diverticulitis     -  admit to inpt, med-surg     - CT as above     - continue cefepime, flagyl     - fluids, anti-emetics, pain control     - NPO except for ice chips, sips w/ meds for now  Hyperglycemia     - check A1c, follow  HTN     - resume home regimen when confirmed     - PRN metoprolol available  Severe persistent asthma     - continue home regimen when confirmed  CKD3a     - he is at baseline, monitor  OSA     - cpap qhs  BPH     - continue home regimen when confirmed  HLD CAD     - continue home regimen when confirmed  Neuroleptic induced parkinsonism     - continue home regimen when confirmed  Hypothyroidism     - continue home regimen when confirmed  Depression     - continue home regimen  Advance Care Planning:   Code Status: FULL  Consults: None  Family Communication: None at bedside  Severity of Illness: The appropriate patient status for this patient is INPATIENT. Inpatient status is judged to be reasonable and necessary in order to provide the required intensity of service to ensure the patient's safety. The patient's presenting symptoms, physical exam findings, and initial radiographic and laboratory data in the context of their chronic comorbidities is felt to place them at high risk for further clinical deterioration. Furthermore, it is not anticipated that the patient will be medically stable for discharge from the hospital within 2 midnights of admission.   * I certify that at the point of admission it is my clinical judgment that the patient will require inpatient hospital care spanning beyond 2 midnights from the point of admission due to high intensity of service, high risk for further deterioration and high frequency of surveillance required.*  Author: Jonnie Finner, DO 03/01/2022 9:27 AM  For on call review [email or web address].

## 2022-03-01 NOTE — Telephone Encounter (Signed)
See additional notes, pt was seen in the ER and admitted.

## 2022-03-01 NOTE — Progress Notes (Signed)
  Transition of Care [facility]) Screening Note   Patient Details  Name: Jared [name] Date of Birth: Apr 25, 1951   Transition of Care [facility]) CM/SW Contact:    Lennart Pall, LCSW Phone Number: 03/01/2022, 2:41 PM    Transition of Care Department [facility]) has reviewed patient and no TOC needs have been identified at this time. We will continue to monitor patient advancement through interdisciplinary progression rounds. If new patient transition needs arise, please place a TOC consult.

## 2022-03-01 NOTE — Progress Notes (Signed)
Pt refused cpap tonight.  Pt was advised that RT is available all night should he change his mind. 

## 2022-03-01 NOTE — Progress Notes (Signed)
Pharmacy Antibiotic Note  Jared [name] is a 71 y.o. male admitted on 02/28/2022 with  abdominal pain .  Pharmacy has been consulted for cefepime dosing. Pt is afebrile but WBC is elevated. SCr is WNL.   Plan: Cefepime 2gm IV Q12H F/u renal fxn, C&S, clinical status  Weight: 90.3 kg (199 lb)  Temp (24hrs), Avg:98.6 F (37 C), Min:98.5 F (36.9 C), Max:98.7 F (37.1 C)  Recent Labs  Lab 02/28/22 1911  WBC 21.4*  CREATININE 1.23    Estimated Creatinine Clearance: 57.3 mL/min (by C-G formula based on SCr of 1.23 mg/dL).    Allergies  Allergen Reactions   Contrast Media [Iodinated Contrast Media] Other (See Comments)    Was told not to take d/t pt only having 1 kidney   Nsaids Other (See Comments)    Was told not to take d/t pt only having 1 kidney   Penicillins Hives, Itching and Other (See Comments)    Has patient had a PCN reaction causing immediate rash, facial/tongue/throat swelling, SOB or lightheadedness with hypotension: Unknown Has patient had a PCN reaction causing severe rash involving mucus membranes or skin necrosis: Unknown Has patient had a PCN reaction that required hospitalization: Unknown Has patient had a PCN reaction occurring within the last 10 years: No If all of the above answers are "NO", then may proceed with Cephalosporin use.    Sulfonamide Derivatives Hives and Itching   Codeine Nausea Only   Statins Other (See Comments)    Myalgias    Antimicrobials this admission: Cefepime 6/12>> Flagyl 6/12>>  Dose adjustments this admission: N/A  Microbiology results: Pending  Thank you for allowing pharmacy to be a part of this patient's care.  Lynell Kussman, Rande Lawman 03/01/2022 9:52 AM

## 2022-03-01 NOTE — Telephone Encounter (Signed)
Thanks for your help [place].  Looks like he went to ED, CT scan consistent with diverticulitis.  It looks like they could not get his pain controlled and he is being admitted for this.

## 2022-03-02 DIAGNOSIS — K5792 Diverticulitis of intestine, part unspecified, without perforation or abscess without bleeding: Secondary | ICD-10-CM | POA: Diagnosis not present

## 2022-03-02 LAB — COMPREHENSIVE METABOLIC PANEL
ALT: 17 U/L (ref 0–44)
AST: 14 U/L — ABNORMAL LOW (ref 15–41)
Albumin: 3.3 g/dL — ABNORMAL LOW (ref 3.5–5.0)
Alkaline Phosphatase: 65 U/L (ref 38–126)
Anion gap: 9 (ref 5–15)
BUN: 24 mg/dL — ABNORMAL HIGH (ref 8–23)
CO2: 24 mmol/L (ref 22–32)
Calcium: 8.6 mg/dL — ABNORMAL LOW (ref 8.9–10.3)
Chloride: 105 mmol/L (ref 98–111)
Creatinine, Ser: 1.51 mg/dL — ABNORMAL HIGH (ref 0.61–1.24)
GFR, Estimated: 48 mL/min — ABNORMAL LOW (ref >60)
Glucose, Bld: 97 mg/dL (ref 70–99)
Potassium: 4 mmol/L (ref 3.5–5.1)
Sodium: 138 mmol/L (ref 135–145)
Total Bilirubin: 0.8 mg/dL (ref 0.3–1.2)
Total Protein: 7 g/dL (ref 6.5–8.1)

## 2022-03-02 LAB — CBC
HCT: 36.8 % — ABNORMAL LOW (ref 39.0–52.0)
Hemoglobin: 11.6 g/dL — ABNORMAL LOW (ref 13.0–17.0)
MCH: 27.1 pg (ref 26.0–34.0)
MCHC: 31.5 g/dL (ref 30.0–36.0)
MCV: 86 fL (ref 80.0–100.0)
Platelets: 276 10*3/uL (ref 150–400)
RBC: 4.28 MIL/uL (ref 4.22–5.81)
RDW: 16.5 % — ABNORMAL HIGH (ref 11.5–15.5)
WBC: 13.3 10*3/uL — ABNORMAL HIGH (ref 4.0–10.5)
nRBC: 0 % (ref 0.0–0.2)

## 2022-03-02 MED ORDER — MONTELUKAST SODIUM 10 MG PO TABS
10.0000 mg | ORAL_TABLET | Freq: Every day | ORAL | Status: DC
  Administered 2022-03-02: 10 mg via ORAL
  Filled 2022-03-02: qty 1

## 2022-03-02 MED ORDER — LIVING WELL WITH DIABETES BOOK
Freq: Once | Status: AC
  Filled 2022-03-02: qty 1

## 2022-03-02 MED ORDER — ARIPIPRAZOLE 10 MG PO TABS
5.0000 mg | ORAL_TABLET | Freq: Every morning | ORAL | Status: DC
  Administered 2022-03-02 – 2022-03-03 (×2): 5 mg via ORAL
  Filled 2022-03-02 (×4): qty 1

## 2022-03-02 MED ORDER — AMLODIPINE BESYLATE 10 MG PO TABS
10.0000 mg | ORAL_TABLET | Freq: Every day | ORAL | Status: DC
  Administered 2022-03-02 – 2022-03-03 (×2): 10 mg via ORAL
  Filled 2022-03-02 (×2): qty 1

## 2022-03-02 MED ORDER — TRAZODONE HCL 50 MG PO TABS
50.0000 mg | ORAL_TABLET | Freq: Every day | ORAL | Status: DC
  Administered 2022-03-02: 50 mg via ORAL
  Filled 2022-03-02: qty 1

## 2022-03-02 MED ORDER — ISOSORBIDE MONONITRATE ER 30 MG PO TB24
30.0000 mg | ORAL_TABLET | Freq: Every day | ORAL | Status: DC
  Administered 2022-03-02: 30 mg via ORAL
  Filled 2022-03-02: qty 1

## 2022-03-02 MED ORDER — HYDROMORPHONE HCL 1 MG/ML IJ SOLN
0.5000 mg | INTRAMUSCULAR | Status: DC | PRN
  Administered 2022-03-02: 1 mg via INTRAVENOUS
  Filled 2022-03-02: qty 1

## 2022-03-02 MED ORDER — DULOXETINE HCL 60 MG PO CPEP
60.0000 mg | ORAL_CAPSULE | Freq: Every morning | ORAL | Status: DC
  Administered 2022-03-02 – 2022-03-03 (×2): 60 mg via ORAL
  Filled 2022-03-02 (×2): qty 1

## 2022-03-02 MED ORDER — LEVOTHYROXINE SODIUM 25 MCG PO TABS
25.0000 ug | ORAL_TABLET | Freq: Every morning | ORAL | Status: DC
  Administered 2022-03-03: 25 ug via ORAL
  Filled 2022-03-02: qty 1

## 2022-03-02 MED ORDER — IPRATROPIUM-ALBUTEROL 0.5-2.5 (3) MG/3ML IN SOLN
3.0000 mL | Freq: Four times a day (QID) | RESPIRATORY_TRACT | Status: DC | PRN
Start: 2022-03-02 — End: 2022-03-03

## 2022-03-02 MED ORDER — BUDESONIDE 0.25 MG/2ML IN SUSP
0.2500 mg | Freq: Two times a day (BID) | RESPIRATORY_TRACT | Status: DC
  Administered 2022-03-02: 0.25 mg via RESPIRATORY_TRACT
  Filled 2022-03-02: qty 2

## 2022-03-02 NOTE — Progress Notes (Signed)
Inpatient Diabetes Program Recommendations  AACE/ADA: New Consensus Statement on Inpatient Glycemic Control (2015)  Target Ranges:  Prepandial:   less than 140 mg/dL      Peak postprandial:   less than 180 mg/dL (1-2 hours)      Critically ill patients:  140 - 180 mg/dL   Lab Results  Component Value Date   GLUCAP 108 (H) 06/02/2021   HGBA1C 6.5 (H) 03/01/2022    Review of Glycemic Control  Diabetes history: New-onset DM Outpatient Diabetes medications: None Current orders for Inpatient glycemic control: None  HgbA1C - 6.5% PCP - [name]  Inpatient Diabetes Program Recommendations:    Discussed HgbA1C of 6.5%, with diagnosis of DM2. Pt states he sees PCP for yearly check-ups and this is new diagnosis. Discussed pathophysiology of DM, basic home care, importance of checking CBGs and maintaining good CBG control to prevent long-term and short-term complications. Reviewed glucose and A1C goals. Ordered Living Well with DM book. Pt states he exercises regularly and will eat healthier and f/u with PCP. Questions answered. Pt seems motivated to control his diabetes.  Thank you. Lorenda Peck, RD, LDN, CDE Inpatient Diabetes Coordinator [phone]

## 2022-03-02 NOTE — Progress Notes (Signed)
PROGRESS NOTE    Jared [name]  CZY:[id number] DOB: [birth year] DOA: 02/28/2022 PCP: Burnard Bunting, MD    Brief Narrative:  Jared [name] is a 71 y.o. male with medical history significant of neuroleptic induced parkinonism, diverticulosis, CKD3a, CAD s/p PCI, OSA on CPAP, GERD, HTN, hypothyroidism, asthma. Presenting with abdominal pain. Symptoms started 2 days about with sharp, stabbing pain in the lower abdomen and rectum.  Found to have diverticulitis.     Assessment and Plan:  Acute diverticulitis     - continue cefepime, flagyl     - fluids, anti-emetics, pain control     - improved so start clears for now   Hyperglycemia     - A1c: 6.5     -diabetic diet     -defer to PCP start of medications at follow up, diabetic coordinator for education   HTN     - resume home regimen    Severe persistent asthma     - continue home regimen   CKD3a     - daily labs   OSA     - cpap qhs   BPH     - continue home regimen when confirmed   HLD CAD Neuroleptic induced parkinsonism   Hypothyroidism     - continue home regimen   Depression     - continue home regimen     DVT prophylaxis: enoxaparin (LOVENOX) injection 40 mg Start: 03/01/22 2200    Code Status: Full Code Family Communication:   Disposition Plan:  Level of care: Med-Surg Status is: Inpatient Remains inpatient appropriate because: needs IV abx    Consultants:  none   Subjective: Pain better, up walking hallways  Objective: Vitals:   03/01/22 1402 03/01/22 2048 03/02/22 0150 03/02/22 0604  BP: 116/71 134/72 123/64 (!) 158/72  Pulse: 77 81 84 75  Resp: '18 18 17 18  '$ Temp: 98.7 F (37.1 C) 99 F (37.2 C) 98.7 F (37.1 C) 98.5 F (36.9 C)  TempSrc:  Oral Oral Oral  SpO2: 94% 95% 93% 94%  Weight:      Height:        Intake/Output Summary (Last 24 hours) at 03/02/2022 0846 Last data filed at 03/02/2022 0600 Gross per 24 hour  Intake 2099.68 ml  Output --  Net 2099.68 ml    Filed Weights   02/28/22 1857 03/01/22 1152  Weight: 90.3 kg 90.3 kg    Examination:   General: Appearance:    Obese male in no acute distress     Lungs:     Clear to auscultation bilaterally, respirations unlabored  Heart:    Normal heart rate. Normal rhythm. No murmurs, rubs, or gallops.    MS:   All extremities are intact.    Neurologic:   Awake, alert, oriented x 3. No apparent focal neurological           defect.        Data Reviewed: I have personally reviewed following labs and imaging studies  CBC: Recent Labs  Lab 02/28/22 1911 03/02/22 0342  WBC 21.4* 13.3*  HGB 13.8 11.6*  HCT 43.0 36.8*  MCV 82.4 86.0  PLT 363 093   Basic Metabolic Panel: Recent Labs  Lab 02/28/22 1911 03/02/22 0342  NA 138 138  K 4.1 4.0  CL 101 105  CO2 23 24  GLUCOSE 161* 97  BUN 13 24*  CREATININE 1.23 1.51*  CALCIUM 10.1 8.6*   GFR: Estimated Creatinine Clearance: 46.7 mL/min (A) (by C-G  formula based on SCr of 1.51 mg/dL (H)). Liver Function Tests: Recent Labs  Lab 02/28/22 1911 03/02/22 0342  AST 17 14*  ALT 25 17  ALKPHOS 84 65  BILITOT 0.4 0.8  PROT 8.3* 7.0  ALBUMIN 4.6 3.3*   Recent Labs  Lab 02/28/22 1911  LIPASE <10*    Recent Labs    03/01/22 1609  HGBA1C 6.5*         Radiology Studies: CT ABDOMEN PELVIS WO CONTRAST  Result Date: 02/28/2022 CLINICAL DATA:  Severe left lower quadrant pain EXAM: CT ABDOMEN AND PELVIS WITHOUT CONTRAST TECHNIQUE: Multidetector CT imaging of the abdomen and pelvis was performed following the standard protocol without IV contrast. RADIATION DOSE REDUCTION: This exam was performed according to the departmental dose-optimization program which includes automated exposure control, adjustment of the mA and/or kV according to patient size and/or use of iterative reconstruction technique. COMPARISON:  10/28/2021 FINDINGS: Lower chest: No acute abnormality Hepatobiliary: Diffuse low-density throughout the liver  compatible with fatty infiltration. Scattered hepatic calcifications. No suspicious focal abnormality. Gallbladder unremarkable. Pancreas: No focal abnormality or ductal dilatation. Spleen: No focal abnormality.  Normal size. Adrenals/Urinary Tract: Prior left nephrectomy. Stable cysts within the right kidney. No stones or hydronephrosis. Adrenal glands and urinary bladder unremarkable. Stomach/Bowel: Colonic diverticulosis, most pronounced in the left colon. Stranding around the distal descending and proximal sigmoid colon suggesting acute diverticulitis. Stomach and small bowel decompressed. Vascular/Lymphatic: Aortic atherosclerosis. No evidence of aneurysm or adenopathy. Reproductive: No visible focal abnormality. Other: No free fluid or free air. Musculoskeletal: No acute bony abnormality. IMPRESSION: Colonic diverticulosis with stranding around the distal descending colon and proximal sigmoid colon concerning for active diverticulitis. Hepatic steatosis. Aortic atherosclerosis. Electronically Signed   By: Rolm Baptise M.D.   On: 02/28/2022 20:04        Scheduled Meds:  enoxaparin (LOVENOX) injection  40 mg Subcutaneous Q24H   Continuous Infusions:  ceFEPime (MAXIPIME) IV 2 g (03/02/22 0215)   lactated ringers 100 mL/hr at 03/02/22 0025   metronidazole 500 mg (03/02/22 0028)     LOS: 1 day    Time spent: 35 minutes spent on chart review, discussion with nursing staff, consultants, updating family and interview/physical exam; more than 50% of that time was spent in counseling and/or coordination of care.    Geradine Girt, DO [facility] Available via Epic secure chat 7am-7pm After these hours, please refer to coverage provider listed on [email or web address] 03/02/2022, 8:46 AM

## 2022-03-02 NOTE — Progress Notes (Signed)
Patient continues to decline nocturnal CPAP.  

## 2022-03-03 DIAGNOSIS — K5792 Diverticulitis of intestine, part unspecified, without perforation or abscess without bleeding: Secondary | ICD-10-CM | POA: Diagnosis not present

## 2022-03-03 LAB — CBC
HCT: 34.2 % — ABNORMAL LOW (ref 39.0–52.0)
Hemoglobin: 11 g/dL — ABNORMAL LOW (ref 13.0–17.0)
MCH: 27 pg (ref 26.0–34.0)
MCHC: 32.2 g/dL (ref 30.0–36.0)
MCV: 84 fL (ref 80.0–100.0)
Platelets: 250 10*3/uL (ref 150–400)
RBC: 4.07 MIL/uL — ABNORMAL LOW (ref 4.22–5.81)
RDW: 15.9 % — ABNORMAL HIGH (ref 11.5–15.5)
WBC: 8.5 10*3/uL (ref 4.0–10.5)
nRBC: 0 % (ref 0.0–0.2)

## 2022-03-03 LAB — BASIC METABOLIC PANEL
Anion gap: 7 (ref 5–15)
BUN: 19 mg/dL (ref 8–23)
CO2: 27 mmol/L (ref 22–32)
Calcium: 8.8 mg/dL — ABNORMAL LOW (ref 8.9–10.3)
Chloride: 107 mmol/L (ref 98–111)
Creatinine, Ser: 1.25 mg/dL — ABNORMAL HIGH (ref 0.61–1.24)
GFR, Estimated: >60 mL/min (ref >60)
Glucose, Bld: 97 mg/dL (ref 70–99)
Potassium: 4.1 mmol/L (ref 3.5–5.1)
Sodium: 141 mmol/L (ref 135–145)

## 2022-03-03 MED ORDER — ALUM & MAG HYDROXIDE-SIMETH 200-200-20 MG/5ML PO SUSP
ORAL | Status: AC
  Filled 2022-03-03: qty 30

## 2022-03-03 MED ORDER — METRONIDAZOLE 500 MG PO TABS: 500.0000 mg | ORAL_TABLET | Freq: Two times a day (BID) | ORAL | 0 refills | Status: DC

## 2022-03-03 MED ORDER — METRONIDAZOLE 500 MG PO TABS
500.0000 mg | ORAL_TABLET | Freq: Two times a day (BID) | ORAL | Status: DC
  Administered 2022-03-03: 500 mg via ORAL
  Filled 2022-03-03: qty 1

## 2022-03-03 MED ORDER — CEFADROXIL 500 MG PO CAPS
500.0000 mg | ORAL_CAPSULE | Freq: Two times a day (BID) | ORAL | Status: DC
Start: 2022-03-03 — End: 2022-03-03
  Administered 2022-03-03: 500 mg via ORAL
  Filled 2022-03-03: qty 1

## 2022-03-03 MED ORDER — OXYCODONE HCL 5 MG PO TABS: 5.0000 mg | ORAL_TABLET | ORAL | 0 refills | Status: DC | PRN

## 2022-03-03 MED ORDER — ALUM & MAG HYDROXIDE-SIMETH 200-200-20 MG/5ML PO SUSP
30.0000 mL | ORAL | Status: DC | PRN
Start: 2022-03-03 — End: 2022-03-03
  Administered 2022-03-03 (×2): 30 mL via ORAL
  Filled 2022-03-03: qty 30

## 2022-03-03 MED ORDER — CEFADROXIL 500 MG PO CAPS: 500.0000 mg | ORAL_CAPSULE | Freq: Two times a day (BID) | ORAL | 0 refills | Status: DC

## 2022-03-03 NOTE — Discharge Summary (Addendum)
Physician Discharge Summary  ROCKLAND KOTARSKI PJK:[id number] DOB: [birth year] DOA: 02/28/2022  PCP: Burnard Bunting, MD  Admit date: 02/28/2022 Discharge date: 03/03/2022  Admitted From: home Discharge disposition: home   Recommendations for Outpatient Follow-Up:   Please follow up on A1C: 6.5- patient to change diet   Discharge Diagnosis:   Principal Problem:   Acute diverticulitis Active Problems:   Hypothyroidism   Chronic kidney disease, stage 3a ([place])   Mixed hyperlipidemia   Coronary atherosclerosis   BPH (benign prostatic hyperplasia)   Depression, prolonged   Essential hypertension   Severe persistent asthma   Parkinsonism ([place])   OSA on CPAP   Hyperglycemia    Discharge Condition: Improved.  Diet recommendation: soft, low residue  Wound care: None.  Code status: Full.   History of Present Illness:   Jared [name] is a 71 y.o. male with medical history significant of neuroleptic induced parkinonism, diverticulosis, CKD3a, CAD s/p PCI, OSA on CPAP, GERD, HTN, hypothyroidism, asthma. Presenting with abdominal pain. Symptoms started 2 days about with sharp, stabbing pain in the lower abdomen and rectum. He had N/V. He didn't have any fever or diarrhea. He didn't try any meds to help. When his symptoms did not improve yesterday, he decided to go to the ED for assistance. He denies any other aggravating or alleviating factors.     Hospital Course by Problem:   Acute diverticulitis     - tolerated advancement of diet  -change to PO abx to finish course  -outpatient GI follow up   Hyperglycemia     - A1c: 6.5    -diabetic diet     -defer to PCP start of medications at follow up, diabetic coordinator for education   HTN     - resume home regimen    Severe persistent asthma     - continue home regimen   CKD3a     - stable   OSA     - cpap qhs   BPH     - continue home regimen    HLD CAD Neuroleptic induced parkinsonism    Hypothyroidism     - continue home regimen   Depression     - continue home regimen      Medical Consultants:      Discharge Exam:   Vitals:   03/02/22 2137 03/03/22 0640  BP: (!) 158/78 126/72  Pulse: 71 66  Resp: 18 18  Temp: 98.4 F (36.9 C) 98 F (36.7 C)  SpO2: 91% 93%   Vitals:   03/02/22 0933 03/02/22 1332 03/02/22 2137 03/03/22 0640  BP: 127/70 133/65 (!) 158/78 126/72  Pulse: 86 76 71 66  Resp: '16 18 18 18  '$ Temp: 98.4 F (36.9 C) 98.5 F (36.9 C) 98.4 F (36.9 C) 98 F (36.7 C)  TempSrc: Oral Oral Oral Oral  SpO2: 94% 96% 91% 93%  Weight:      Height:        General exam: Appears calm and comfortable.     The results of significant diagnostics from this hospitalization (including imaging, microbiology, ancillary and laboratory) are listed below for reference.     Procedures and Diagnostic Studies:   CT ABDOMEN PELVIS WO CONTRAST  Result Date: 02/28/2022 CLINICAL DATA:  Severe left lower quadrant pain EXAM: CT ABDOMEN AND PELVIS WITHOUT CONTRAST TECHNIQUE: Multidetector CT imaging of the abdomen and pelvis was performed following the standard protocol without IV contrast. RADIATION DOSE REDUCTION: This exam was performed  according to the departmental dose-optimization program which includes automated exposure control, adjustment of the mA and/or kV according to patient size and/or use of iterative reconstruction technique. COMPARISON:  10/28/2021 FINDINGS: Lower chest: No acute abnormality Hepatobiliary: Diffuse low-density throughout the liver compatible with fatty infiltration. Scattered hepatic calcifications. No suspicious focal abnormality. Gallbladder unremarkable. Pancreas: No focal abnormality or ductal dilatation. Spleen: No focal abnormality.  Normal size. Adrenals/Urinary Tract: Prior left nephrectomy. Stable cysts within the right kidney. No stones or hydronephrosis. Adrenal glands and urinary bladder unremarkable. Stomach/Bowel: Colonic  diverticulosis, most pronounced in the left colon. Stranding around the distal descending and proximal sigmoid colon suggesting acute diverticulitis. Stomach and small bowel decompressed. Vascular/Lymphatic: Aortic atherosclerosis. No evidence of aneurysm or adenopathy. Reproductive: No visible focal abnormality. Other: No free fluid or free air. Musculoskeletal: No acute bony abnormality. IMPRESSION: Colonic diverticulosis with stranding around the distal descending colon and proximal sigmoid colon concerning for active diverticulitis. Hepatic steatosis. Aortic atherosclerosis. Electronically Signed   By: Rolm Baptise M.D.   On: 02/28/2022 20:04     Labs:   Basic Metabolic Panel: Recent Labs  Lab 02/28/22 1911 03/02/22 0342 03/03/22 0346  NA 138 138 141  K 4.1 4.0 4.1  CL 101 105 107  CO2 '23 24 27  '$ GLUCOSE 161* 97 97  BUN 13 24* 19  CREATININE 1.23 1.51* 1.25*  CALCIUM 10.1 8.6* 8.8*   GFR Estimated Creatinine Clearance: 56.4 mL/min (A) (by C-G formula based on SCr of 1.25 mg/dL (H)). Liver Function Tests: Recent Labs  Lab 02/28/22 1911 03/02/22 0342  AST 17 14*  ALT 25 17  ALKPHOS 84 65  BILITOT 0.4 0.8  PROT 8.3* 7.0  ALBUMIN 4.6 3.3*   Recent Labs  Lab 02/28/22 1911  LIPASE <10*   No results for input(s): "AMMONIA" in the last 168 hours. Coagulation profile No results for input(s): "INR", "PROTIME" in the last 168 hours.  CBC: Recent Labs  Lab 02/28/22 1911 03/02/22 0342 03/03/22 0346  WBC 21.4* 13.3* 8.5  HGB 13.8 11.6* 11.0*  HCT 43.0 36.8* 34.2*  MCV 82.4 86.0 84.0  PLT 363 276 250   Cardiac Enzymes: No results for input(s): "CKTOTAL", "CKMB", "CKMBINDEX", "TROPONINI" in the last 168 hours. BNP: Invalid input(s): "POCBNP" CBG: No results for input(s): "GLUCAP" in the last 168 hours. D-Dimer No results for input(s): "DDIMER" in the last 72 hours. Hgb A1c Recent Labs    03/01/22 1609  HGBA1C 6.5*   Lipid Profile No results for input(s):  "CHOL", "HDL", "LDLCALC", "TRIG", "CHOLHDL", "LDLDIRECT" in the last 72 hours. Thyroid function studies No results for input(s): "TSH", "T4TOTAL", "T3FREE", "THYROIDAB" in the last 72 hours.  Invalid input(s): "FREET3" Anemia work up No results for input(s): "VITAMINB12", "FOLATE", "FERRITIN", "TIBC", "IRON", "RETICCTPCT" in the last 72 hours. Microbiology No results found for this or any previous visit (from the past 240 hour(s)).   Discharge Instructions:   Discharge Instructions     Discharge instructions   Complete by: As directed    Low residue diet for now, also watch the amount of sugar/carbs you are eating as your HgbA1c was higher than prior-- will have you follow up with Dr. [name] for this.   Increase activity slowly   Complete by: As directed       Allergies as of 03/03/2022       Reactions   Contrast Media [iodinated Contrast Media] Other (See Comments)   Was told not to take d/t pt only having 1 kidney  Nsaids Other (See Comments)   Was told not to take d/t pt only having 1 kidney   Penicillins Hives, Itching, Other (See Comments)   Tolerates Cefepime Has patient had a PCN reaction causing immediate rash, facial/tongue/throat swelling, SOB or lightheadedness with hypotension: Unknown Has patient had a PCN reaction causing severe rash involving mucus membranes or skin necrosis: Unknown Has patient had a PCN reaction that required hospitalization: Unknown Has patient had a PCN reaction occurring within the last 10 years: No If all of the above answers are "NO", then may proceed with Cephalosporin use.   Sulfonamide Derivatives Hives, Itching   Codeine Nausea Only   Statins Other (See Comments)   Myalgias        Medication List     TAKE these medications    acetaminophen 500 MG tablet Commonly known as: TYLENOL Take 1,500 mg by mouth every 6 (six) hours as needed for mild pain.   albuterol 108 (90 Base) MCG/ACT inhaler Commonly known as: VENTOLIN  HFA Can inhale two puffs every four to six hours as needed for cough, wheeze, shortness of breath, or chest tightness.   amLODipine 10 MG tablet Commonly known as: NORVASC Take 10 mg by mouth daily.   ARIPiprazole 5 MG tablet Commonly known as: ABILIFY Take 5 mg by mouth every morning.   aspirin EC 325 MG tablet Take 325 mg by mouth daily.   betamethasone dipropionate 0.05 % cream Apply 1 application topically daily as needed (dry skin).   cefadroxil 500 MG capsule Commonly known as: DURICEF Take 1 capsule (500 mg total) by mouth 2 (two) times daily.   DULoxetine 60 MG capsule Commonly known as: CYMBALTA Take 60 mg by mouth every morning.   ergocalciferol 1.25 MG (50000 UT) capsule Commonly known as: VITAMIN D2 Take 50,000 Units by mouth once a week. Saturday   esomeprazole 40 MG capsule Commonly known as: NEXIUM Take 40 mg by mouth daily.   fluticasone 110 MCG/ACT inhaler Commonly known as: FLOVENT HFA 2 puffs twice a day with a spacer to prevent cough or wheeze. Rinse mouth after use. What changed:  how much to take how to take this when to take this reasons to take this additional instructions   furosemide 20 MG tablet Commonly known as: LASIX Take 20 mg by mouth daily as needed for edema.   hyoscyamine 0.125 MG tablet Commonly known as: LEVSIN Take 1 tablet (0.125 mg total) by mouth every 6 (six) hours as needed for cramping.   ipratropium-albuterol 0.5-2.5 (3) MG/3ML Soln Commonly known as: DUONEB Take 3 mLs by nebulization every 6 (six) hours as needed (wheezing, coughing, shortness of breath).   isosorbide mononitrate 30 MG 24 hr tablet Commonly known as: IMDUR take 1 tablet by mouth once daily What changed:  how much to take how to take this when to take this   levothyroxine 25 MCG tablet Commonly known as: SYNTHROID Take 25 mcg by mouth every morning.   metroNIDAZOLE 500 MG tablet Commonly known as: FLAGYL Take 1 tablet (500 mg total) by  mouth every 12 (twelve) hours.   montelukast 10 MG tablet Commonly known as: SINGULAIR TAKE 1 TABLET(10 MG) BY MOUTH AT BEDTIME What changed: See the new instructions.   multivitamin tablet Take 1 tablet by mouth at bedtime.   omalizumab 150 MG/ML prefilled syringe Commonly known as: Xolair Inject 150 mg into the skin every 28 (twenty-eight) days.   ondansetron 4 MG tablet Commonly known as: ZOFRAN Take 4 mg by  mouth daily as needed for nausea/vomiting.   optichamber diamond Misc Use as directed with inhaler.   AeroChamber Plus inhaler Use as instructed   oxyCODONE 5 MG immediate release tablet Commonly known as: Oxy IR/ROXICODONE Take 1 tablet (5 mg total) by mouth every 4 (four) hours as needed for moderate pain.   potassium chloride 10 MEQ tablet Commonly known as: KLOR-CON M Take 10 mEq by mouth daily as needed (low levels).   Praluent 150 MG/ML Soaj Generic drug: Alirocumab INJECT 150 MG INTO THE SKIN EVERY 14 DAYS What changed: See the new instructions.   telmisartan 20 MG tablet Commonly known as: MICARDIS Take 20 mg by mouth daily.   traZODone 50 MG tablet Commonly known as: DESYREL Take 1 tablet (50 mg total) by mouth at bedtime.   vitamin C 500 MG tablet Commonly known as: ASCORBIC ACID Take 500 mg by mouth 2 (two) times daily.        Follow-up Information     Burnard Bunting, MD Follow up in 1 week(s).   Specialty: Internal Medicine Contact information: [place] [address] [phone]         Lorretta Harp, MD .   Specialties: Cardiology, Radiology Contact information: [address] [place] [address] [phone]                  Time coordinating discharge: 35 min  Signed:  Geradine Girt DO  [facility] 03/03/2022, 9:54 AM

## 2022-03-04 ENCOUNTER — Ambulatory Visit: Payer: Medicare PPO

## 2022-03-14 ENCOUNTER — Ambulatory Visit (INDEPENDENT_AMBULATORY_CARE_PROVIDER_SITE_OTHER): Payer: Medicare PPO

## 2022-03-14 DIAGNOSIS — J4550 Severe persistent asthma, uncomplicated: Secondary | ICD-10-CM | POA: Diagnosis not present

## 2022-03-17 DIAGNOSIS — M47816 Spondylosis without myelopathy or radiculopathy, lumbar region: Secondary | ICD-10-CM | POA: Diagnosis not present

## 2022-03-17 DIAGNOSIS — I2510 Atherosclerotic heart disease of native coronary artery without angina pectoris: Secondary | ICD-10-CM | POA: Diagnosis not present

## 2022-03-17 DIAGNOSIS — N1831 Chronic kidney disease, stage 3a: Secondary | ICD-10-CM | POA: Diagnosis not present

## 2022-03-17 DIAGNOSIS — N401 Enlarged prostate with lower urinary tract symptoms: Secondary | ICD-10-CM | POA: Diagnosis not present

## 2022-03-17 DIAGNOSIS — R739 Hyperglycemia, unspecified: Secondary | ICD-10-CM | POA: Diagnosis not present

## 2022-03-17 DIAGNOSIS — E039 Hypothyroidism, unspecified: Secondary | ICD-10-CM | POA: Diagnosis not present

## 2022-03-17 DIAGNOSIS — K5792 Diverticulitis of intestine, part unspecified, without perforation or abscess without bleeding: Secondary | ICD-10-CM | POA: Diagnosis not present

## 2022-03-17 DIAGNOSIS — E785 Hyperlipidemia, unspecified: Secondary | ICD-10-CM | POA: Diagnosis not present

## 2022-03-17 DIAGNOSIS — R531 Weakness: Secondary | ICD-10-CM | POA: Diagnosis not present

## 2022-03-17 DIAGNOSIS — E119 Type 2 diabetes mellitus without complications: Secondary | ICD-10-CM | POA: Diagnosis not present

## 2022-03-17 DIAGNOSIS — I129 Hypertensive chronic kidney disease with stage 1 through stage 4 chronic kidney disease, or unspecified chronic kidney disease: Secondary | ICD-10-CM | POA: Diagnosis not present

## 2022-03-21 ENCOUNTER — Telehealth: Payer: Self-pay | Admitting: Cardiovascular Disease

## 2022-03-21 NOTE — Telephone Encounter (Signed)
Spoke to patient he stated he has been having chest pain off and on for the past 1 month.He is having chest pain at present.Rates pain # 2 to 3.Advised to go to ED to be evaluated.

## 2022-03-21 NOTE — Telephone Encounter (Signed)
Returned call to patient left message on personal voice mail to call back. 

## 2022-03-21 NOTE — Telephone Encounter (Signed)
  Pt c/o of Chest Pain: STAT if CP now or developed within 24 hours  1. Are you having CP right now? No   2. Are you experiencing any other symptoms (ex. SOB, nausea, vomiting, sweating)? fatigue  3. How long have you been experiencing CP? A month  4. Is your CP continuous or coming and going? Coming and going   5. Have you taken Nitroglycerin? No  ?  Pt said, his been having on and off chest pain for a month now and its not getting better. He said, he also feel fatigue all the time. He made an appt witj Coletta Memos on 07/06

## 2022-03-23 ENCOUNTER — Telehealth: Payer: Self-pay | Admitting: Pharmacist

## 2022-03-23 DIAGNOSIS — I10 Essential (primary) hypertension: Secondary | ICD-10-CM

## 2022-03-23 DIAGNOSIS — E782 Mixed hyperlipidemia: Secondary | ICD-10-CM

## 2022-03-23 MED ORDER — REPATHA SURECLICK 140 MG/ML ~~LOC~~ SOAJ: 1.0000 | SUBCUTANEOUS | 11 refills | Status: DC

## 2022-03-23 NOTE — Telephone Encounter (Signed)
Patient returned call. He took his first injection of Praluent recently. Has no latex allergy. Reviewed his insurance change. He is ok with the switch. Aware that I will send in Rx to pharmacy. He is not taking zetia. No issues, thought the injection replaced it. Will continue to hold zetia and recheck labs in sept at [place] office. Repatha PA approved through 09/18/22.
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
## 2022-03-23 NOTE — Telephone Encounter (Signed)
Patient currently on Praluent. Previously took [place]. I do not see any allergy. Insurance [employer] state health plan now covers [place] and does not cover Praluent at all.  Called pt and LVM. Will submit PA for [place]. KEY   Has the patient taken Praluent or zetia? Zetia not on med list but Dr. Gwenlyn Found note says he is on. No fill hx for [id number]

## 2022-03-24 ENCOUNTER — Ambulatory Visit: Payer: Medicare PPO | Admitting: General Practice

## 2022-03-24 ENCOUNTER — Encounter: Payer: Self-pay | Admitting: General Practice

## 2022-03-24 VITALS — BP 116/62 | HR 52 | Ht 67.0 in | Wt 190.0 lb

## 2022-03-24 DIAGNOSIS — R5383 Other fatigue: Secondary | ICD-10-CM | POA: Diagnosis not present

## 2022-03-24 DIAGNOSIS — I2510 Atherosclerotic heart disease of native coronary artery without angina pectoris: Secondary | ICD-10-CM | POA: Diagnosis not present

## 2022-03-24 DIAGNOSIS — E782 Mixed hyperlipidemia: Secondary | ICD-10-CM

## 2022-03-24 DIAGNOSIS — R0989 Other specified symptoms and signs involving the circulatory and respiratory systems: Secondary | ICD-10-CM | POA: Diagnosis not present

## 2022-03-24 DIAGNOSIS — I10 Essential (primary) hypertension: Secondary | ICD-10-CM

## 2022-03-24 NOTE — Patient Instructions (Signed)
Medication Instructions:  The current medical regimen is effective;  continue present plan and medications as directed. Please refer to the Current Medication list given to you today.   *If you need a refill on your cardiac medications before your next appointment, please call your pharmacy*  Lab Work:    NONE     If you have labs (blood work) drawn today and your tests are completely normal, you will receive your results only by: [facility] (if you have MyChart) OR  A paper copy in the mail If you have any lab test that is abnormal or we need to change your treatment, we will call you to review the results.   Testing/Procedures:  Echocardiogram - Your physician has requested that you have an echocardiogram. Echocardiography is a painless test that uses sound waves to create images of your heart. It provides your doctor with information about the size and shape of your heart and how well your heart's chambers and valves are working. This procedure takes approximately one hour. There are no restrictions for this procedure.   Special Instructions PLEASE READ AND FOLLOW HEART HEALTHY DIET ATTACHED  PLEASE INCREASE PHYSICAL ACTIVITY-AS TOLERATED  Follow-Up: Your next appointment:  1-2 month(s) In Person with Quay Burow, MD     At [facility], you and your health needs are our priority.  As part of our continuing mission to provide you with exceptional heart care, we have created designated Provider Care Teams.  These Care Teams include your primary Cardiologist (physician) and Advanced Practice Providers (APPs -  Physician Assistants and Nurse Practitioners) who all work together to provide you with the care you need, when you need it.  We recommend signing up for the patient portal called "MyChart".  Sign up information is provided on this After Visit Summary.  MyChart is used to connect with patients for Virtual Visits (Telemedicine).  Patients are able to view lab/test results,  encounter notes, upcoming appointments, etc.  Non-urgent messages can be sent to your provider as well.   To learn more about what you can do with MyChart, go to [email or web address].    Important Information About Sugar     Heart-Healthy Eating Plan Heart-healthy meal planning includes: Eating less unhealthy fats. Eating more healthy fats. Making other changes in your diet. Talk with your doctor or a diet specialist (dietitian) to create an eating plan that is right for you. What is my plan? What are tips for following this plan? Cooking Avoid frying your food. Try to bake, boil, grill, or broil it instead. You can also reduce fat by: Removing the skin from poultry. Removing all visible fats from meats. Steaming vegetables in water or broth. Meal planning  At meals, divide your plate into four equal parts: Fill one-half of your plate with vegetables and green salads. Fill one-fourth of your plate with whole grains. Fill one-fourth of your plate with lean protein foods. Eat 4-5 servings of vegetables per day. A serving of vegetables is: 1 cup of raw or cooked vegetables. 2 cups of raw leafy greens. Eat 4-5 servings of fruit per day. A serving of fruit is: 1 medium whole fruit.  cup of dried fruit.  cup of fresh, frozen, or canned fruit.  cup of 100% fruit juice. Eat more foods that have soluble fiber. These are apples, broccoli, carrots, beans, peas, and barley. Try to get 20-30 g of fiber per day. Eat 4-5 servings of nuts, legumes, and seeds per week: 1 serving of dried  beans or legumes equals  cup after being cooked. 1 serving of nuts is  cup. 1 serving of seeds equals 1 tablespoon. General information Eat more home-cooked food. Eat less restaurant, buffet, and fast food. Limit or avoid alcohol. Limit foods that are high in starch and sugar. Avoid fried foods. Lose weight if you are overweight. Keep track of how much salt (sodium) you eat. This is important if  you have high blood pressure. Ask your doctor to tell you more about this. Try to add vegetarian meals each week. Fats Choose healthy fats. These include olive oil and canola oil, flaxseeds, walnuts, almonds, and seeds. Eat more omega-3 fats. These include salmon, mackerel, sardines, tuna, flaxseed oil, and ground flaxseeds. Try to eat fish at least 2 times each week. Check food labels. Avoid foods with trans fats or high amounts of saturated fat. Limit saturated fats. These are often found in animal products, such as meats, butter, and cream. These are also found in plant foods, such as palm oil, palm kernel oil, and coconut oil. Avoid foods with partially hydrogenated oils in them. These have trans fats. Examples are stick margarine, some tub margarines, cookies, crackers, and other baked goods. What foods can I eat? Fruits All fresh, canned (in natural juice), or frozen fruits. Vegetables Fresh or frozen vegetables (raw, steamed, roasted, or grilled). Green salads. Grains Most grains. Choose whole wheat and whole grains most of the time. Rice and pasta, including brown rice and pastas made with whole wheat. Meats and other proteins Lean, well-trimmed beef, veal, pork, and lamb. Chicken and [place] without skin. All fish and shellfish. Wild duck, rabbit, pheasant, and venison. Egg whites or low-cholesterol egg substitutes. Dried beans, peas, lentils, and tofu. Seeds and most nuts. Dairy Low-fat or nonfat cheeses, including ricotta and mozzarella. Skim or 1% milk that is liquid, powdered, or evaporated. Buttermilk that is made with low-fat milk. Nonfat or low-fat yogurt. Fats and oils Non-hydrogenated (trans-free) margarines. Vegetable oils, including soybean, sesame, sunflower, olive, peanut, safflower, corn, canola, and cottonseed. Salad dressings or mayonnaise made with a vegetable oil. Beverages Mineral water. Coffee and tea. Diet carbonated beverages. Sweets and desserts Sherbet,  gelatin, and fruit ice. Small amounts of dark chocolate. Limit all sweets and desserts. Seasonings and condiments All seasonings and condiments. The items listed above may not be a complete list of foods and drinks you can eat. Contact a dietitian for more options. What foods should I avoid? Fruits Canned fruit in heavy syrup. Fruit in cream or butter sauce. Fried fruit. Limit coconut. Vegetables Vegetables cooked in cheese, cream, or butter sauce. Fried vegetables. Grains Breads that are made with saturated or trans fats, oils, or whole milk. Croissants. Sweet rolls. Donuts. High-fat crackers, such as cheese crackers. Meats and other proteins Fatty meats, such as hot dogs, ribs, sausage, bacon, rib-eye roast or steak. High-fat deli meats, such as salami and bologna. Caviar. Domestic duck and goose. Organ meats, such as liver. Dairy Cream, sour cream, cream cheese, and creamed cottage cheese. Whole-milk cheeses. Whole or 2% milk that is liquid, evaporated, or condensed. Whole buttermilk. Cream sauce or high-fat cheese sauce. Yogurt that is made from whole milk. Fats and oils Meat fat, or shortening. Cocoa butter, hydrogenated oils, palm oil, coconut oil, palm kernel oil. Solid fats and shortenings, including bacon fat, salt pork, lard, and butter. Nondairy cream substitutes. Salad dressings with cheese or sour cream. Beverages Regular sodas and juice drinks with added sugar. Sweets and desserts Frosting. Pudding. Cookies. Cakes.  Pies. Milk chocolate or white chocolate. Buttered syrups. Full-fat ice cream or ice cream drinks. The items listed above may not be a complete list of foods and drinks to avoid. Contact a dietitian for more information. Summary Heart-healthy meal planning includes eating less unhealthy fats, eating more healthy fats, and making other changes in your diet. Eat a balanced diet. This includes fruits and vegetables, low-fat or nonfat dairy, lean protein, nuts and  legumes, whole grains, and heart-healthy oils and fats. This information is not intended to replace advice given to you by your health care provider. Make sure you discuss any questions you have with your health care provider. Document Revised: 01/14/2021 Document Reviewed: 01/14/2021 Elsevier Patient Education  2022 [employer].

## 2022-04-04 ENCOUNTER — Ambulatory Visit: Payer: Medicare PPO | Admitting: Internal Medicine

## 2022-04-04 ENCOUNTER — Ambulatory Visit
Admission: RE | Admit: 2022-04-04 | Discharge: 2022-04-04 | Disposition: A | Discharge status: Home / Self Care | Payer: Medicare PPO | Source: Ambulatory Visit | Attending: Internal Medicine | Admitting: Internal Medicine

## 2022-04-04 ENCOUNTER — Encounter: Payer: Self-pay | Admitting: Internal Medicine

## 2022-04-04 VITALS — BP 122/78 | HR 74 | Temp 98.0°F | Ht 67.0 in | Wt 190.0 lb

## 2022-04-04 DIAGNOSIS — R059 Cough, unspecified: Secondary | ICD-10-CM | POA: Diagnosis not present

## 2022-04-04 DIAGNOSIS — R062 Wheezing: Secondary | ICD-10-CM | POA: Diagnosis not present

## 2022-04-04 DIAGNOSIS — J3089 Other allergic rhinitis: Secondary | ICD-10-CM

## 2022-04-04 DIAGNOSIS — G8929 Other chronic pain: Secondary | ICD-10-CM | POA: Insufficient documentation

## 2022-04-04 DIAGNOSIS — J4551 Severe persistent asthma with (acute) exacerbation: Secondary | ICD-10-CM

## 2022-04-04 DIAGNOSIS — J4550 Severe persistent asthma, uncomplicated: Secondary | ICD-10-CM | POA: Diagnosis not present

## 2022-04-04 DIAGNOSIS — J302 Other seasonal allergic rhinitis: Secondary | ICD-10-CM

## 2022-04-04 DIAGNOSIS — M47816 Spondylosis without myelopathy or radiculopathy, lumbar region: Secondary | ICD-10-CM | POA: Insufficient documentation

## 2022-04-04 DIAGNOSIS — K219 Gastro-esophageal reflux disease without esophagitis: Secondary | ICD-10-CM

## 2022-04-04 MED ORDER — FLUTICASONE PROPIONATE HFA 110 MCG/ACT IN AERO: INHALATION_SPRAY | RESPIRATORY_TRACT | 5 refills | Status: DC

## 2022-04-04 MED ORDER — ALBUTEROL SULFATE HFA 108 (90 BASE) MCG/ACT IN AERS: 2.0000 | INHALATION_SPRAY | RESPIRATORY_TRACT | 1 refills | Status: DC | PRN

## 2022-04-04 MED ORDER — ALBUTEROL SULFATE (2.5 MG/3ML) 0.083% IN NEBU: 2.5000 mg | INHALATION_SOLUTION | Freq: Four times a day (QID) | RESPIRATORY_TRACT | 12 refills | Status: DC | PRN

## 2022-04-04 MED ORDER — METHYLPREDNISOLONE ACETATE 40 MG/ML IJ SUSP
40.0000 mg | Freq: Once | INTRAMUSCULAR | Status: AC
  Administered 2022-04-04: 40 mg via INTRAMUSCULAR

## 2022-04-04 NOTE — Patient Instructions (Signed)
Asthma Exacerbation:  - 40 mg IM depo in clinic - Tuesday prednisone pack-instruction on packet - CXR; will call once results available  For maintenance:  1.  Continue omalizumab injections  2.  Continue Flovent 110 - 2 inhalations twice a day with spacer  During Flares-increase Flovent 110- 3 inhalations twice a day with spacer for 2 weeks or until symptoms resolve  3.  Continue Nexium 40 mg - 1 tablet 1 time per day  4.  Continue albuterol HFA or nebulization if needed  5.  Obtain fall flu vaccine  6.  Return to clinic in 6 months or earlier if problem

## 2022-04-04 NOTE — Progress Notes (Signed)
Please let Jared [name] know that his CXR was read as normal.  He does not need antibiotics at this time.  He should continue the plan as discussed in clinic.

## 2022-04-04 NOTE — Progress Notes (Signed)
FOLLOW UP Date of Service/Encounter:  04/04/22   Subjective:  Jared [name] (DOB: Apr 17, 1951) is a 71 y.o. male who returns to the [place] on 04/04/2022 in re-evaluation of the following: ACUTE visit- asthma exacerbation History obtained from: chart review and patient.  For Review, LV was on 05/25/21  with Dr. Neldon Mc seen for routine follow-up for  severe asthma on Xolair, allergic rhinitis, and reflux induced respiratory disease.  Doing well at that visit. FEV1 81% predicted at that visit.  He was continued on flovent, nexium and Xolair.   Today presents for follow-up. For the past 2 weeks he has been having increased wheezing and dry coughing spells.  Cough present throughout the day and at night.  Waking him up at night on occasion.  He is using his Flovent 110 2 puffs twice daily as prescribed.  In the past 2 weeks, he has also been using his albuterol via nebulizer around twice per day.  He does get temporary relief from albuterol use. He denies any fever, increased rhinitis symptoms, or other sick symptoms.  He feels that his asthma has flared.  Unsure if secondary to poor air quality as he has been staying inside mostly.  He is compliant with his Xolair injections.  Due next week on the 24th.  Last Xolair on 03/14/22.   Allergies as of 04/04/2022       Reactions   Contrast Media [iodinated Contrast Media] Other (See Comments)   Was told not to take d/t pt only having 1 kidney   Nsaids Other (See Comments)   Was told not to take d/t pt only having 1 kidney   Penicillins Hives, Itching, Other (See Comments)   Tolerates Cefepime Has patient had a PCN reaction causing immediate rash, facial/tongue/throat swelling, SOB or lightheadedness with hypotension: Unknown Has patient had a PCN reaction causing severe rash involving mucus membranes or skin necrosis: Unknown Has patient had a PCN reaction that required hospitalization: Unknown Has patient had a PCN  reaction occurring within the last 10 years: No If all of the above answers are "NO", then may proceed with Cephalosporin use.   Sulfonamide Derivatives Hives, Itching   Pholcodine    Codeine Nausea Only   Statins Other (See Comments)   Myalgias        Medication List        Accurate as of April 04, 2022  1:08 PM. If you have any questions, ask your nurse or doctor.          STOP taking these medications    metroNIDAZOLE 500 MG tablet Commonly known as: FLAGYL       TAKE these medications    acetaminophen 500 MG tablet Commonly known as: TYLENOL Take 1,500 mg by mouth every 6 (six) hours as needed for mild pain.   albuterol 108 (90 Base) MCG/ACT inhaler Commonly known as: VENTOLIN HFA Can inhale two puffs every four to six hours as needed for cough, wheeze, shortness of breath, or chest tightness.   amLODipine 10 MG tablet Commonly known as: NORVASC Take 10 mg by mouth daily.   ARIPiprazole 5 MG tablet Commonly known as: ABILIFY Take 5 mg by mouth every morning.   aspirin EC 325 MG tablet Take 325 mg by mouth daily.   betamethasone dipropionate 0.05 % cream Apply 1 application topically daily as needed (dry skin).   cefadroxil 500 MG capsule Commonly known as: DURICEF Take 1 capsule (500 mg total) by mouth 2 (two)  times daily.   DULoxetine 60 MG capsule Commonly known as: CYMBALTA Take 60 mg by mouth every morning.   ergocalciferol 1.25 MG (50000 UT) capsule Commonly known as: VITAMIN D2 Take 50,000 Units by mouth once a week. Saturday   esomeprazole 40 MG capsule Commonly known as: NEXIUM Take 40 mg by mouth daily.   fluticasone 110 MCG/ACT inhaler Commonly known as: FLOVENT HFA 2 puffs twice a day with a spacer to prevent cough or wheeze. Rinse mouth after use. What changed:  how much to take how to take this when to take this reasons to take this additional instructions   furosemide 20 MG tablet Commonly known as: LASIX Take 20 mg by  mouth daily as needed for edema.   hyoscyamine 0.125 MG tablet Commonly known as: LEVSIN Take 1 tablet (0.125 mg total) by mouth every 6 (six) hours as needed for cramping.   ipratropium-albuterol 0.5-2.5 (3) MG/3ML Soln Commonly known as: DUONEB Take 3 mLs by nebulization every 6 (six) hours as needed (wheezing, coughing, shortness of breath).   isosorbide mononitrate 30 MG 24 hr tablet Commonly known as: IMDUR take 1 tablet by mouth once daily What changed:  how much to take how to take this when to take this   levothyroxine 25 MCG tablet Commonly known as: SYNTHROID Take 25 mcg by mouth every morning.   montelukast 10 MG tablet Commonly known as: SINGULAIR TAKE 1 TABLET(10 MG) BY MOUTH AT BEDTIME What changed: See the new instructions.   multivitamin tablet Take 1 tablet by mouth at bedtime.   omalizumab 150 MG/ML prefilled syringe Commonly known as: Xolair Inject 150 mg into the skin every 28 (twenty-eight) days.   ondansetron 4 MG tablet Commonly known as: ZOFRAN Take 4 mg by mouth daily as needed for nausea/vomiting.   optichamber diamond Misc Use as directed with inhaler.   AeroChamber Plus inhaler Use as instructed   oxyCODONE 5 MG immediate release tablet Commonly known as: Oxy IR/ROXICODONE Take 1 tablet (5 mg total) by mouth every 4 (four) hours as needed for moderate pain.   potassium chloride 10 MEQ tablet Commonly known as: KLOR-CON M Take 10 mEq by mouth daily as needed (low levels).   Repatha SureClick 161 MG/ML Soaj Generic drug: Evolocumab Inject 1 Pen into the skin every 14 (fourteen) days.   telmisartan 20 MG tablet Commonly known as: MICARDIS Take 20 mg by mouth daily.   traZODone 50 MG tablet Commonly known as: DESYREL Take 1 tablet (50 mg total) by mouth at bedtime.   vitamin C 500 MG tablet Commonly known as: ASCORBIC ACID Take 500 mg by mouth 2 (two) times daily.       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[place])    left arm tremor   Osteoarthritis    lower back   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   Renal cell carcinoma  1997   Left kidney   Sleep apnea    wears CPAP   Sleep talking    Snoring 12/25/2013   Wears glasses    Past Surgical History:  Procedure Laterality Date   ANTERIOR CERVICAL DECOMP/DISCECTOMY FUSION N/A 08/27/2014   Procedure: Cervical six-seven anterior cervical decompression fusion with removal of hardware at Cervical five-six;  Surgeon: Erline Levine, MD;  Location: [facility];  Service: Neurosurgery;  Laterality: N/A;  Cervical six-seven anterior cervical decompression fusion with removal of hardware at Cervical five-six   BACK SURGERY     1992, 2013 lower back   CARDIAC CATHETERIZATION  05/10/2007   Minimal CAD, normal LV systolic function, medical management   CARDIAC CATHETERIZATION  04/08/2008   RCA ulcerated 70-80% stenosis, stented with a 3x66m Endeavor stent at 13atm for 50sec, reduced from 80% ulcerated stenosis to 0%.   CARDIAC CATHETERIZATION  05/07/2009   50% distal left main disease-IVUS or flow wire too dangerous in particular setting to perform intervention.   CARDIAC CATHETERIZATION  03/18/2010   Medical management   CARDIOVASCULAR STRESS TEST  02/09/2012   Normal, no significant wall abnormalities noted   CARPAL TUNNEL RELEASE Bilateral 2000   COLONOSCOPY     COLONOSCOPY W/ BIOPSIES AND POLYPECTOMY     [place] PROPOFOL   [place] [place] [place]   NECK SURGERY  2005   NEPHRECTOMY Left 1997   secondary to cancer   TOTAL KNEE ARTHROPLASTY Left 2005   TOTAL KNEE ARTHROPLASTY Right 04/13/2018   Procedure: RIGHT TOTAL KNEE ARTHROPLASTY;  Surgeon: NNetta Cedars MD;  [place] [facility]  Service: Orthopedics;  Laterality: Right;   TRANSURETHRAL RESECTION OF PROSTATE N/A 03/25/2021   Procedure: TRANSURETHRAL RESECTION OF THE PROSTATE (TURP);  Surgeon: DFranchot Gallo MD;  [facility]  Service: Urology;  Laterality: N/A;   Otherwise, there have been no changes to his past medical history, surgical history, family history, or social history.  ROS: All others negative except as noted per HPI.   Objective:  BP 122/78   Pulse 74   Temp 98 F (36.7 C) (Temporal)   Ht '5\' 7"'$  (1.702 m)   Wt 190 lb (86.2 kg)   SpO2 96%   BMI 29.76 kg/m  Body mass index is 29.76 kg/m. Physical Exam: General Appearance:  Alert, cooperative, no distress, appears stated age  Head:  Normocephalic, without obvious abnormality, atraumatic  Eyes:  Conjunctiva clear, EOM's intact  Nose: Nares normal, normal mucosa, no visible anterior polyps, and septum midline  Throat: Lips, tongue normal; teeth and gums normal, normal posterior oropharynx  Neck: Supple, symmetrical  Lungs:   Wheezing heard loudest in LUL despite coughing multiple times to clear airway and end-expiratory wheezing throughout, Respirations unlabored, intermittent dry coughing  Heart:  regular rate and rhythm and no murmur, Appears well perfused  Extremities: No edema  Skin: Skin color, texture, turgor normal, no rashes or lesions on visualized portions of skin  Neurologic: No gross deficits   Spirometry:  Tracings reviewed. His effort: Good reproducible efforts. FVC: 2.60L FEV1: 1.91L, 68% predicted FEV1/FVC ratio:  93% Interpretation: Spirometry consistent with possible restrictive disease. FEV1 remarkably less than last visit. Please see scanned spirometry results for details.  Assessment/Plan   Asthma Exacerbation:  - 40 mg IM depo in clinic - Tuesday prednisone pack-instruction on  packet to start tomorrow - CXR; will call once results available- -results available prior to signing note and read as normal; plan unchanged from documented  For maintenance:  1.  Continue omalizumab injections  2.  Continue Flovent 110 - 2 inhalations twice a day with spacer  During Flares-increase Flovent 110- 3 inhalations twice a day with spacer for 2 weeks or until symptoms resolve  3.  Continue Nexium 40 mg - 1 tablet 1 time per day  4.  Continue albuterol HFA or nebulization if needed  5.  Obtain fall flu vaccine  6.  Return to clinic in 6 months or earlier if problem  Sigurd Sos, MD  [facility]

## 2022-04-06 ENCOUNTER — Ambulatory Visit (HOSPITAL_COMMUNITY)
Admission: RE | Admit: 2022-04-06 | Discharge: 2022-04-06 | Disposition: A | Discharge status: Home / Self Care | Payer: Medicare PPO | Source: Ambulatory Visit | Attending: Internal Medicine | Admitting: Internal Medicine

## 2022-04-06 DIAGNOSIS — I10 Essential (primary) hypertension: Secondary | ICD-10-CM | POA: Diagnosis not present

## 2022-04-06 DIAGNOSIS — R5383 Other fatigue: Secondary | ICD-10-CM | POA: Diagnosis not present

## 2022-04-06 DIAGNOSIS — I2510 Atherosclerotic heart disease of native coronary artery without angina pectoris: Secondary | ICD-10-CM | POA: Diagnosis not present

## 2022-04-06 DIAGNOSIS — L719 Rosacea, unspecified: Secondary | ICD-10-CM | POA: Diagnosis not present

## 2022-04-06 DIAGNOSIS — I351 Nonrheumatic aortic (valve) insufficiency: Secondary | ICD-10-CM | POA: Insufficient documentation

## 2022-04-06 DIAGNOSIS — D0422 Carcinoma in situ of skin of left ear and external auricular canal: Secondary | ICD-10-CM | POA: Diagnosis not present

## 2022-04-06 DIAGNOSIS — E785 Hyperlipidemia, unspecified: Secondary | ICD-10-CM | POA: Insufficient documentation

## 2022-04-06 DIAGNOSIS — L578 Other skin changes due to chronic exposure to nonionizing radiation: Secondary | ICD-10-CM | POA: Diagnosis not present

## 2022-04-06 DIAGNOSIS — L820 Inflamed seborrheic keratosis: Secondary | ICD-10-CM | POA: Diagnosis not present

## 2022-04-06 DIAGNOSIS — C44529 Squamous cell carcinoma of skin of other part of trunk: Secondary | ICD-10-CM | POA: Diagnosis not present

## 2022-04-06 DIAGNOSIS — D2261 Melanocytic nevi of right upper limb, including shoulder: Secondary | ICD-10-CM | POA: Diagnosis not present

## 2022-04-06 DIAGNOSIS — L219 Seborrheic dermatitis, unspecified: Secondary | ICD-10-CM | POA: Diagnosis not present

## 2022-04-06 DIAGNOSIS — I119 Hypertensive heart disease without heart failure: Secondary | ICD-10-CM | POA: Diagnosis not present

## 2022-04-06 DIAGNOSIS — D485 Neoplasm of uncertain behavior of skin: Secondary | ICD-10-CM | POA: Diagnosis not present

## 2022-04-06 DIAGNOSIS — Z85820 Personal history of malignant melanoma of skin: Secondary | ICD-10-CM | POA: Diagnosis not present

## 2022-04-06 DIAGNOSIS — B079 Viral wart, unspecified: Secondary | ICD-10-CM | POA: Diagnosis not present

## 2022-04-06 DIAGNOSIS — L814 Other melanin hyperpigmentation: Secondary | ICD-10-CM | POA: Diagnosis not present

## 2022-04-06 DIAGNOSIS — L570 Actinic keratosis: Secondary | ICD-10-CM | POA: Diagnosis not present

## 2022-04-06 DIAGNOSIS — Z85828 Personal history of other malignant neoplasm of skin: Secondary | ICD-10-CM | POA: Diagnosis not present

## 2022-04-06 DIAGNOSIS — L853 Xerosis cutis: Secondary | ICD-10-CM | POA: Diagnosis not present

## 2022-04-06 LAB — ECHOCARDIOGRAM COMPLETE
Area-P 1/2: 3.54 cm2
P 1/2 time: 390 msec
S' Lateral: 3.3 cm

## 2022-04-08 ENCOUNTER — Telehealth: Payer: Self-pay | Admitting: General Practice

## 2022-04-08 NOTE — Telephone Encounter (Signed)
Left message to call back  

## 2022-04-08 NOTE — Telephone Encounter (Signed)
Patient returned call for his echo results 

## 2022-04-08 NOTE — Telephone Encounter (Signed)
Pt updated ECHO results and verbalized understanding

## 2022-04-11 ENCOUNTER — Ambulatory Visit (INDEPENDENT_AMBULATORY_CARE_PROVIDER_SITE_OTHER): Payer: Medicare PPO

## 2022-04-11 DIAGNOSIS — J4550 Severe persistent asthma, uncomplicated: Secondary | ICD-10-CM

## 2022-04-18 DIAGNOSIS — R5383 Other fatigue: Secondary | ICD-10-CM | POA: Diagnosis not present

## 2022-04-18 DIAGNOSIS — Z125 Encounter for screening for malignant neoplasm of prostate: Secondary | ICD-10-CM | POA: Diagnosis not present

## 2022-04-18 DIAGNOSIS — N1831 Chronic kidney disease, stage 3a: Secondary | ICD-10-CM | POA: Diagnosis not present

## 2022-04-18 DIAGNOSIS — E291 Testicular hypofunction: Secondary | ICD-10-CM | POA: Diagnosis not present

## 2022-04-18 DIAGNOSIS — G629 Polyneuropathy, unspecified: Secondary | ICD-10-CM | POA: Diagnosis not present

## 2022-04-18 DIAGNOSIS — F5221 Male erectile disorder: Secondary | ICD-10-CM | POA: Diagnosis not present

## 2022-04-18 DIAGNOSIS — E039 Hypothyroidism, unspecified: Secondary | ICD-10-CM | POA: Diagnosis not present

## 2022-04-18 DIAGNOSIS — E785 Hyperlipidemia, unspecified: Secondary | ICD-10-CM | POA: Diagnosis not present

## 2022-04-18 DIAGNOSIS — R202 Paresthesia of skin: Secondary | ICD-10-CM | POA: Diagnosis not present

## 2022-04-18 DIAGNOSIS — Z0000 Encounter for general adult medical examination without abnormal findings: Secondary | ICD-10-CM | POA: Diagnosis not present

## 2022-04-19 ENCOUNTER — Ambulatory Visit (INDEPENDENT_AMBULATORY_CARE_PROVIDER_SITE_OTHER): Payer: Medicare PPO | Admitting: Allergy and Immunology

## 2022-04-19 VITALS — BP 122/70 | HR 79 | Temp 97.8°F | Resp 16 | Ht 65.55 in | Wt 206.6 lb

## 2022-04-19 DIAGNOSIS — J3089 Other allergic rhinitis: Secondary | ICD-10-CM

## 2022-04-19 DIAGNOSIS — K219 Gastro-esophageal reflux disease without esophagitis: Secondary | ICD-10-CM

## 2022-04-19 DIAGNOSIS — J302 Other seasonal allergic rhinitis: Secondary | ICD-10-CM

## 2022-04-19 DIAGNOSIS — J4551 Severe persistent asthma with (acute) exacerbation: Secondary | ICD-10-CM | POA: Diagnosis not present

## 2022-04-19 MED ORDER — OPTICHAMBER DIAMOND MISC
1.0000 | Freq: Every day | 1 refills | Status: DC
Start: 2022-04-19 — End: 2022-12-20

## 2022-04-19 MED ORDER — ESOMEPRAZOLE MAGNESIUM 40 MG PO CPDR: 40.0000 mg | DELAYED_RELEASE_CAPSULE | Freq: Two times a day (BID) | ORAL | 5 refills | Status: DC

## 2022-04-19 MED ORDER — BREZTRI AEROSPHERE 160-9-4.8 MCG/ACT IN AERO: 2.0000 | INHALATION_SPRAY | Freq: Two times a day (BID) | RESPIRATORY_TRACT | 5 refills | Status: DC

## 2022-04-19 MED ORDER — ALBUTEROL SULFATE HFA 108 (90 BASE) MCG/ACT IN AERS: 2.0000 | INHALATION_SPRAY | RESPIRATORY_TRACT | 1 refills | Status: DC | PRN

## 2022-04-19 MED ORDER — FLUTICASONE PROPIONATE HFA 110 MCG/ACT IN AERO: INHALATION_SPRAY | RESPIRATORY_TRACT | 5 refills | Status: DC

## 2022-04-19 NOTE — Progress Notes (Unsigned)
Jared [name]   Follow-up Note  Referring Provider: Burnard Bunting, MD Primary Provider: Burnard Bunting, MD Date of Office Visit: 04/19/2022  Subjective:   Jared [name] (DOB: Sep 04, 1951) is a 71 y.o. male who returns to the [facility] on 04/19/2022 in re-evaluation of the following:  HPI: Jared [name] returns to this clinic in reevaluation of asthma, allergic rhinitis, LPR.  His last visit to this clinic was with Dr. Simona Huh on 04 April 2022.  I last saw him in this clinic on 25 May 2021.  He was really doing very well until his visit with Dr. Simona Huh.  He visited Dr. Simona Huh because he had an acute exacerbation of his asthma without any obvious precipitant.  He did not have any new exposures and he did not have any significant upper airway symptoms and he did not have a fever.  But he was having a fair amount of reflux around that point in time.  He was treated with a systemic steroid injection and oral steroids and he is better but he still has lingering coughing in the morning and still make some phlegm production and his reflux is still little bit active.  He does not have any disturbed sleep and does not have any other significant respiratory tract symptoms or chest pain and he is not using any albuterol.  Allergies as of 04/19/2022       Reactions   Contrast Media [iodinated Contrast Media] Other (See Comments)   Was told not to take d/t pt only having 1 kidney   Nsaids Other (See Comments)   Was told not to take d/t pt only having 1 kidney   Penicillins Hives, Itching, Other (See Comments)   Tolerates Cefepime Has patient had a PCN reaction causing immediate rash, facial/tongue/throat swelling, SOB or lightheadedness with hypotension: Unknown Has patient had a PCN reaction causing severe rash involving mucus membranes or skin necrosis: Unknown Has patient had a PCN reaction that required hospitalization: Unknown Has  patient had a PCN reaction occurring within the last 10 years: No If all of the above answers are "NO", then may proceed with Cephalosporin use.   Sulfonamide Derivatives Hives, Itching   Pholcodine    Codeine Nausea Only   Statins Other (See Comments)   Myalgias        Medication List    acetaminophen 500 MG tablet Commonly known as: TYLENOL Take 1,500 mg by mouth every 6 (six) hours as needed for mild pain.   albuterol 108 (90 Base) MCG/ACT inhaler Commonly known as: VENTOLIN HFA Inhale 2 puffs into the lungs every 4 (four) hours as needed for wheezing or shortness of breath. Can inhale two puffs every four to six hours as needed for cough, wheeze, shortness of breath, or chest tightness.   albuterol (2.5 MG/3ML) 0.083% nebulizer solution Commonly known as: PROVENTIL Take 3 mLs (2.5 mg total) by nebulization every 6 (six) hours as needed for wheezing or shortness of breath.   amLODipine 10 MG tablet Commonly known as: NORVASC Take 10 mg by mouth daily.   ARIPiprazole 5 MG tablet Commonly known as: ABILIFY Take 5 mg by mouth every morning.   ascorbic acid 500 MG tablet Commonly known as: VITAMIN C Take 500 mg by mouth 2 (two) times daily.   aspirin EC 325 MG tablet Take 325 mg by mouth daily.   betamethasone dipropionate 0.05 % cream Apply 1 application topically daily as needed (dry skin).  cefadroxil 500 MG capsule Commonly known as: DURICEF Take 1 capsule (500 mg total) by mouth 2 (two) times daily.   DULoxetine 60 MG capsule Commonly known as: CYMBALTA Take 60 mg by mouth every morning.   ergocalciferol 1.25 MG (50000 UT) capsule Commonly known as: VITAMIN D2 Take 50,000 Units by mouth once a week. Saturday   esomeprazole 40 MG capsule Commonly known as: NEXIUM Take 40 mg by mouth daily.   fluticasone 110 MCG/ACT inhaler Commonly known as: FLOVENT HFA 2 puffs twice a day with a spacer to prevent cough or wheeze. Rinse mouth after use.   furosemide  20 MG tablet Commonly known as: LASIX Take 20 mg by mouth daily as needed for edema.   hyoscyamine 0.125 MG tablet Commonly known as: LEVSIN Take 1 tablet (0.125 mg total) by mouth every 6 (six) hours as needed for cramping.   ipratropium-albuterol 0.5-2.5 (3) MG/3ML Soln Commonly known as: DUONEB Take 3 mLs by nebulization every 6 (six) hours as needed (wheezing, coughing, shortness of breath).   isosorbide mononitrate 30 MG 24 hr tablet Commonly known as: IMDUR take 1 tablet by mouth once daily   ketoconazole 2 % cream Commonly known as: NIZORAL Apply topically daily.   levothyroxine 25 MCG tablet Commonly known as: SYNTHROID Take 25 mcg by mouth every morning.   metroNIDAZOLE 0.75 % cream Commonly known as: METROCREAM Apply topically daily.   montelukast 10 MG tablet Commonly known as: SINGULAIR TAKE 1 TABLET(10 MG) BY MOUTH AT BEDTIME   multivitamin tablet Take 1 tablet by mouth at bedtime.   omalizumab 150 MG/ML prefilled syringe Commonly known as: Xolair Inject 150 mg into the skin every 28 (twenty-eight) days.   ondansetron 4 MG tablet Commonly known as: ZOFRAN Take 4 mg by mouth daily as needed for nausea/vomiting.   optichamber diamond Misc Use as directed with inhaler.   AeroChamber Plus inhaler Use as instructed   oxyCODONE 5 MG immediate release tablet Commonly known as: Oxy IR/ROXICODONE Take 1 tablet (5 mg total) by mouth every 4 (four) hours as needed for moderate pain.   potassium chloride 10 MEQ tablet Commonly known as: KLOR-CON M Take 10 mEq by mouth daily as needed (low levels).   Repatha SureClick 220 MG/ML Soaj Generic drug: Evolocumab Inject 1 Pen into the skin every 14 (fourteen) days.   telmisartan 20 MG tablet Commonly known as: MICARDIS Take 20 mg by mouth daily.   traZODone 50 MG tablet Commonly known as: DESYREL Take 1 tablet (50 mg total) by mouth at bedtime.    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[place])    left arm tremor   Osteoarthritis    lower back   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   Renal cell carcinoma 1997   Left kidney   Sleep apnea    wears CPAP   Sleep talking    Snoring  12/25/2013   Wears glasses     Past Surgical History:  Procedure Laterality Date   ANTERIOR CERVICAL DECOMP/DISCECTOMY FUSION N/A 08/27/2014   Procedure: Cervical six-seven anterior cervical decompression fusion with removal of hardware at Cervical five-six;  Surgeon: Erline Levine, MD;  Location: [facility];  Service: Neurosurgery;  Laterality: N/A;  Cervical six-seven anterior cervical decompression fusion with removal of hardware at Cervical five-six   BACK SURGERY     1992, 2013 lower back   CARDIAC CATHETERIZATION  05/10/2007   Minimal CAD, normal LV systolic function, medical management   CARDIAC CATHETERIZATION  04/08/2008   RCA ulcerated 70-80% stenosis, stented with a 3x62m Endeavor stent at 13atm for 50sec, reduced from 80% ulcerated stenosis to 0%.   CARDIAC CATHETERIZATION  05/07/2009   50% distal left main disease-IVUS or flow wire too dangerous in particular setting to perform intervention.   CARDIAC CATHETERIZATION  03/18/2010   Medical management   CARDIOVASCULAR STRESS TEST  02/09/2012   Normal, no significant wall abnormalities noted   CARPAL TUNNEL RELEASE Bilateral 2000   COLONOSCOPY     COLONOSCOPY W/ BIOPSIES AND POLYPECTOMY     COLONOSCOPY WITH PROPOFOL   [facility]/01/2021   [facility]    as infant groin   NECK SURGERY  2005   NEPHRECTOMY Left 1997   secondary to cancer   TOTAL KNEE ARTHROPLASTY Left 2005   TOTAL KNEE ARTHROPLASTY Right 04/13/2018   Procedure: RIGHT TOTAL KNEE ARTHROPLASTY;  Surgeon: NNetta Cedars MD;  [facility]  Service: Orthopedics;  Laterality: Right;   TRANSURETHRAL RESECTION OF PROSTATE N/A 03/25/2021   Procedure: TRANSURETHRAL RESECTION OF THE PROSTATE (TURP);  Surgeon: DFranchot Gallo MD;  [facility]  Service: Urology;  Laterality: N/A;    Review of systems negative except as noted in HPI / PMHx or noted below:  Review of Systems  Constitutional: Negative.   HENT: Negative.    Eyes: Negative.   Respiratory: Negative.    Cardiovascular: Negative.   Gastrointestinal: Negative.   Genitourinary: Negative.   Musculoskeletal: Negative.   Skin: Negative.   Neurological: Negative.   Endo/Heme/Allergies: Negative.   Psychiatric/Behavioral: Negative.       Objective:   Vitals:   04/19/22 1002  BP: 122/70  Pulse: 79  Resp: 16  Temp: 97.8 F (36.6 C)  SpO2: 95%   Height: 5' 5.55" (166.5 cm)  Weight: 206 lb 9.6 oz (93.7 kg)   Physical Exam Constitutional:      Appearance: He is not diaphoretic.  HENT:     Head: Normocephalic.     Right Ear: Tympanic membrane, ear canal and external ear normal.     Left Ear: Tympanic membrane, ear canal and external ear normal.     Nose: Nose normal. No mucosal edema or rhinorrhea.     Mouth/Throat:     Pharynx: Uvula midline. No oropharyngeal exudate.  Eyes:     Conjunctiva/sclera: Conjunctivae normal.  Neck:     Thyroid: No thyromegaly.     Trachea: Trachea normal. No tracheal tenderness or tracheal deviation.  Cardiovascular:     Rate and Rhythm: Normal rate and regular rhythm.     Heart sounds: Normal heart sounds, S1 normal and S2  normal. No murmur heard. Pulmonary:     Effort: No respiratory distress.     Breath sounds: Normal breath sounds. No stridor.  No wheezing or rales.  Lymphadenopathy:     Head:     Right side of head: No tonsillar adenopathy.     Left side of head: No tonsillar adenopathy.     Cervical: No cervical adenopathy.  Skin:    Findings: No erythema or rash.     Nails: There is no clubbing.  Neurological:     Mental Status: He is alert.     Diagnostics:    Spirometry was performed and demonstrated an FEV1 of 2.16 at 82 % of predicted.  Assessment and Plan:   1. Severe persistent asthma with acute exacerbation   2. Seasonal and perennial allergic rhinitis   3. LPRD (laryngopharyngeal reflux disease)     1.  Continue omalizumab injections  2.  Change Flovent to [employer] - 2 inhalations 2 times per day w/spacer (empty lungs)  3.  Increase Nexium 40 mg - 1 tablet 2 time per day  4. Start prednisone 10 mg - 1 tablet 1 time per day for 10 days only  5.  Continue albuterol HFA or nebulization if needed  6. Can change back to Flovent once flare up is over  7.  Obtain fall flu vaccine and RSV vaccine  8.  Return to clinic in 6 months or earlier if problem  I am going to have Shanon Brow utilize a triple inhaler and a little bit more systemic steroid at low-dose and also treat his reflux a little more aggressively hoping that he will get over his recent flare which was most likely triggered by a viral respiratory tract infection 2 weeks ago.  Assuming he does well with this plan I will see him back in this clinic in 6 months or earlier if there is a problem.  Allena Katz, MD Allergy / Immunology [facility]

## 2022-04-19 NOTE — Patient Instructions (Signed)
  1.  Continue omalizumab injections  2.  Change Flovent to [employer] - 2 inhalations 2 times per day w/spacer (empty lungs)  3.  Increase Nexium 40 mg - 1 tablet 2 time per day  4. Start prednisone 10 mg - 1 tablet 1 time per day for 10 days only  5.  Continue albuterol HFA or nebulization if needed  6. Can change back to Flovent once flare up is over  7.  Obtain fall flu vaccine and RSV vaccine  8.  Return to clinic in 6 months or earlier if problem

## 2022-04-20 ENCOUNTER — Encounter: Payer: Self-pay | Admitting: Allergy and Immunology

## 2022-04-20 DIAGNOSIS — Z6829 Body mass index (BMI) 29.0-29.9, adult: Secondary | ICD-10-CM | POA: Diagnosis not present

## 2022-04-20 DIAGNOSIS — M47816 Spondylosis without myelopathy or radiculopathy, lumbar region: Secondary | ICD-10-CM | POA: Diagnosis not present

## 2022-04-20 DIAGNOSIS — G894 Chronic pain syndrome: Secondary | ICD-10-CM | POA: Diagnosis not present

## 2022-04-22 NOTE — Addendum Note (Signed)
Addended by: Wadie Lessen on: 04/22/2022 04:32 PM   Modules accepted: Orders

## 2022-04-25 DIAGNOSIS — E119 Type 2 diabetes mellitus without complications: Secondary | ICD-10-CM | POA: Diagnosis not present

## 2022-04-25 DIAGNOSIS — J449 Chronic obstructive pulmonary disease, unspecified: Secondary | ICD-10-CM | POA: Diagnosis not present

## 2022-04-25 DIAGNOSIS — E039 Hypothyroidism, unspecified: Secondary | ICD-10-CM | POA: Diagnosis not present

## 2022-04-25 DIAGNOSIS — Z1331 Encounter for screening for depression: Secondary | ICD-10-CM | POA: Diagnosis not present

## 2022-04-25 DIAGNOSIS — I2510 Atherosclerotic heart disease of native coronary artery without angina pectoris: Secondary | ICD-10-CM | POA: Diagnosis not present

## 2022-04-25 DIAGNOSIS — Z0000 Encounter for general adult medical examination without abnormal findings: Secondary | ICD-10-CM | POA: Diagnosis not present

## 2022-04-25 DIAGNOSIS — N1831 Chronic kidney disease, stage 3a: Secondary | ICD-10-CM | POA: Diagnosis not present

## 2022-04-25 DIAGNOSIS — Z1339 Encounter for screening examination for other mental health and behavioral disorders: Secondary | ICD-10-CM | POA: Diagnosis not present

## 2022-04-25 DIAGNOSIS — R82998 Other abnormal findings in urine: Secondary | ICD-10-CM | POA: Diagnosis not present

## 2022-04-25 DIAGNOSIS — I129 Hypertensive chronic kidney disease with stage 1 through stage 4 chronic kidney disease, or unspecified chronic kidney disease: Secondary | ICD-10-CM | POA: Diagnosis not present

## 2022-04-26 ENCOUNTER — Ambulatory Visit (INDEPENDENT_AMBULATORY_CARE_PROVIDER_SITE_OTHER): Payer: Medicare PPO | Admitting: *Deleted

## 2022-04-26 DIAGNOSIS — J4550 Severe persistent asthma, uncomplicated: Secondary | ICD-10-CM | POA: Diagnosis not present

## 2022-04-26 DIAGNOSIS — C44229 Squamous cell carcinoma of skin of left ear and external auricular canal: Secondary | ICD-10-CM | POA: Diagnosis not present

## 2022-04-27 ENCOUNTER — Other Ambulatory Visit: Payer: Self-pay

## 2022-04-27 MED ORDER — ESOMEPRAZOLE MAGNESIUM 40 MG PO CPDR: 40.0000 mg | DELAYED_RELEASE_CAPSULE | Freq: Two times a day (BID) | ORAL | 5 refills | Status: DC

## 2022-05-03 DIAGNOSIS — F4542 Pain disorder with related psychological factors: Secondary | ICD-10-CM | POA: Diagnosis not present

## 2022-05-03 DIAGNOSIS — E291 Testicular hypofunction: Secondary | ICD-10-CM | POA: Diagnosis not present

## 2022-05-04 ENCOUNTER — Encounter: Payer: Self-pay | Admitting: Cardiovascular Disease

## 2022-05-04 ENCOUNTER — Ambulatory Visit: Payer: Medicare PPO | Admitting: Cardiovascular Disease

## 2022-05-04 DIAGNOSIS — I2510 Atherosclerotic heart disease of native coronary artery without angina pectoris: Secondary | ICD-10-CM | POA: Diagnosis not present

## 2022-05-04 DIAGNOSIS — I10 Essential (primary) hypertension: Secondary | ICD-10-CM

## 2022-05-04 DIAGNOSIS — E782 Mixed hyperlipidemia: Secondary | ICD-10-CM

## 2022-05-04 DIAGNOSIS — M6281 Muscle weakness (generalized): Secondary | ICD-10-CM | POA: Diagnosis not present

## 2022-05-04 NOTE — Assessment & Plan Note (Signed)
History of hyperlipidemia recently started Repatha and intolerant to other statin drugs.  His most recent lipid profile performed 04/18/2022 revealed a total cholesterol of 219, LDL of 136 and HDL 40.  We will recheck a lipid liver profile in 3 months.

## 2022-05-04 NOTE — Patient Instructions (Signed)
Medication Instructions:  Your physician recommends that you continue on your current medications as directed. Please refer to the Current Medication list given to you today.  *If you need a refill on your cardiac medications before your next appointment, please call your pharmacy*   Lab Work: Your physician recommends that you return for lab work in: 3 months for FASTING lipid/liver panel  If you have labs (blood work) drawn today and your tests are completely normal, you will receive your results only by: [place] (if you have MyChart) OR A paper copy in the mail If you have any lab test that is abnormal or we need to change your treatment, we will call you to review the results.   Testing/Procedures: Your physician has requested that you have a carotid duplex. This test is an ultrasound of the carotid arteries in your neck. It looks at blood flow through these arteries that supply the brain with blood. Allow one hour for this exam. There are no restrictions or special instructions. To be done in December. This procedure will be done at [place]. [address]    Follow-Up: At [facility], you and your health needs are our priority.  As part of our continuing mission to provide you with exceptional heart care, we have created designated Provider Care Teams.  These Care Teams include your primary Cardiologist (physician) and Advanced Practice Providers (APPs -  Physician Assistants and Nurse Practitioners) who all work together to provide you with the care you need, when you need it.  We recommend signing up for the patient portal called "MyChart".  Sign up information is provided on this After Visit Summary.  MyChart is used to connect with patients for Virtual Visits (Telemedicine).  Patients are able to view lab/test results, encounter notes, upcoming appointments, etc.  Non-urgent messages can be sent to your provider as well.   To learn more about what you can do with  MyChart, go to [email or web address].    Your next appointment:   12 month(s)  The format for your next appointment:   In Person  Provider:   Quay Burow, MD

## 2022-05-04 NOTE — Assessment & Plan Note (Signed)
History of CAD status post remote dominant RCA stenting by Dr. Rex Kras in the year 2000.  He had cardiac cath performed in 2011 revealing a widely patent stent with 30% left main stenosis which apparently was actually improved.  He has not had a cath since that time.  He denies chest pain or shortness of breath.

## 2022-05-04 NOTE — Assessment & Plan Note (Signed)
History of essential hypertension a blood pressure measured today at 120/68.  He is on amlodipine and Micardis.
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
## 2022-05-10 ENCOUNTER — Other Ambulatory Visit: Payer: Self-pay | Admitting: *Deleted

## 2022-05-10 ENCOUNTER — Telehealth: Payer: Self-pay | Admitting: *Deleted

## 2022-05-10 ENCOUNTER — Ambulatory Visit: Payer: Medicare PPO | Admitting: Allergy and Immunology

## 2022-05-10 MED ORDER — MONTELUKAST SODIUM 10 MG PO TABS: 10.0000 mg | ORAL_TABLET | Freq: Every day | ORAL | 5 refills | Status: DC

## 2022-05-10 NOTE — Telephone Encounter (Signed)
Called and spoke with patient he advised that he would like the medication refilled. Refills have been sent in to requested pharmacy. Patient verbalized understanding.

## 2022-05-10 NOTE — Telephone Encounter (Signed)
Patient is requesting a refill of Montelukast, I didn't see it mentioned in your last note. Is this appropriate to refill for the patient?

## 2022-05-11 DIAGNOSIS — L905 Scar conditions and fibrosis of skin: Secondary | ICD-10-CM | POA: Diagnosis not present

## 2022-05-11 DIAGNOSIS — C44529 Squamous cell carcinoma of skin of other part of trunk: Secondary | ICD-10-CM | POA: Diagnosis not present

## 2022-05-11 DIAGNOSIS — L821 Other seborrheic keratosis: Secondary | ICD-10-CM | POA: Diagnosis not present

## 2022-05-13 DIAGNOSIS — E119 Type 2 diabetes mellitus without complications: Secondary | ICD-10-CM | POA: Diagnosis not present

## 2022-05-13 DIAGNOSIS — I129 Hypertensive chronic kidney disease with stage 1 through stage 4 chronic kidney disease, or unspecified chronic kidney disease: Secondary | ICD-10-CM | POA: Diagnosis not present

## 2022-05-13 DIAGNOSIS — R600 Localized edema: Secondary | ICD-10-CM | POA: Diagnosis not present

## 2022-05-13 DIAGNOSIS — N1831 Chronic kidney disease, stage 3a: Secondary | ICD-10-CM | POA: Diagnosis not present

## 2022-05-18 ENCOUNTER — Telehealth: Payer: Self-pay | Admitting: Family Medicine

## 2022-05-18 NOTE — Telephone Encounter (Signed)
I don't know about a scan to send signals to the brain, treating tremors.  There DBS deep brain stimulators, which are brain implants. There is a watch like device with pulsating stimuli to reduce amplitude of tremor in the hand.  Can you provide more info?

## 2022-05-18 NOTE — Telephone Encounter (Signed)
Pt is asking to hear from Dr Brett Fairy re: her thoughts on a new treatment pt heard about for tremors.  Pt heard on the news about a scan of some kind sending a signal to the brain of a pt re: tremors.  Please call pt to discuss.

## 2022-05-24 ENCOUNTER — Ambulatory Visit (INDEPENDENT_AMBULATORY_CARE_PROVIDER_SITE_OTHER): Payer: Medicare PPO | Admitting: *Deleted

## 2022-05-24 DIAGNOSIS — E119 Type 2 diabetes mellitus without complications: Secondary | ICD-10-CM | POA: Diagnosis not present

## 2022-05-24 DIAGNOSIS — J4550 Severe persistent asthma, uncomplicated: Secondary | ICD-10-CM

## 2022-05-24 DIAGNOSIS — I129 Hypertensive chronic kidney disease with stage 1 through stage 4 chronic kidney disease, or unspecified chronic kidney disease: Secondary | ICD-10-CM | POA: Diagnosis not present

## 2022-05-24 DIAGNOSIS — R252 Cramp and spasm: Secondary | ICD-10-CM | POA: Diagnosis not present

## 2022-05-24 DIAGNOSIS — R600 Localized edema: Secondary | ICD-10-CM | POA: Diagnosis not present

## 2022-05-24 DIAGNOSIS — N1831 Chronic kidney disease, stage 3a: Secondary | ICD-10-CM | POA: Diagnosis not present

## 2022-05-31 ENCOUNTER — Ambulatory Visit: Payer: Medicare PPO

## 2022-06-06 ENCOUNTER — Ambulatory Visit: Payer: Medicare PPO

## 2022-06-13 DIAGNOSIS — G894 Chronic pain syndrome: Secondary | ICD-10-CM | POA: Diagnosis not present

## 2022-06-16 DIAGNOSIS — F3181 Bipolar II disorder: Secondary | ICD-10-CM | POA: Diagnosis not present

## 2022-06-20 ENCOUNTER — Ambulatory Visit (INDEPENDENT_AMBULATORY_CARE_PROVIDER_SITE_OTHER): Payer: Medicare PPO

## 2022-06-20 DIAGNOSIS — J4550 Severe persistent asthma, uncomplicated: Secondary | ICD-10-CM

## 2022-06-21 ENCOUNTER — Ambulatory Visit: Payer: Medicare PPO

## 2022-07-05 ENCOUNTER — Other Ambulatory Visit: Payer: Self-pay | Admitting: Urology

## 2022-07-05 DIAGNOSIS — C61 Malignant neoplasm of prostate: Secondary | ICD-10-CM

## 2022-07-11 DIAGNOSIS — R413 Other amnesia: Secondary | ICD-10-CM | POA: Diagnosis not present

## 2022-07-11 DIAGNOSIS — E669 Obesity, unspecified: Secondary | ICD-10-CM | POA: Diagnosis not present

## 2022-07-11 DIAGNOSIS — I739 Peripheral vascular disease, unspecified: Secondary | ICD-10-CM | POA: Diagnosis not present

## 2022-07-11 DIAGNOSIS — E119 Type 2 diabetes mellitus without complications: Secondary | ICD-10-CM | POA: Diagnosis not present

## 2022-07-11 DIAGNOSIS — I10 Essential (primary) hypertension: Secondary | ICD-10-CM | POA: Diagnosis not present

## 2022-07-12 ENCOUNTER — Encounter: Payer: Self-pay | Admitting: Gastroenterology

## 2022-07-12 ENCOUNTER — Ambulatory Visit: Payer: Medicare PPO | Admitting: Gastroenterology

## 2022-07-12 ENCOUNTER — Other Ambulatory Visit (INDEPENDENT_AMBULATORY_CARE_PROVIDER_SITE_OTHER): Payer: Medicare PPO

## 2022-07-12 ENCOUNTER — Other Ambulatory Visit: Payer: Self-pay | Admitting: Internal Medicine

## 2022-07-12 VITALS — BP 124/72 | HR 91 | Ht 67.0 in | Wt 210.4 lb

## 2022-07-12 DIAGNOSIS — Z800 Family history of malignant neoplasm of digestive organs: Secondary | ICD-10-CM | POA: Diagnosis not present

## 2022-07-12 DIAGNOSIS — K760 Fatty (change of) liver, not elsewhere classified: Secondary | ICD-10-CM

## 2022-07-12 DIAGNOSIS — Z860100 Personal history of colon polyps, unspecified: Secondary | ICD-10-CM

## 2022-07-12 DIAGNOSIS — L309 Dermatitis, unspecified: Secondary | ICD-10-CM | POA: Diagnosis not present

## 2022-07-12 DIAGNOSIS — Z79899 Other long term (current) drug therapy: Secondary | ICD-10-CM | POA: Diagnosis not present

## 2022-07-12 DIAGNOSIS — R195 Other fecal abnormalities: Secondary | ICD-10-CM

## 2022-07-12 DIAGNOSIS — K219 Gastro-esophageal reflux disease without esophagitis: Secondary | ICD-10-CM | POA: Diagnosis not present

## 2022-07-12 DIAGNOSIS — L570 Actinic keratosis: Secondary | ICD-10-CM | POA: Diagnosis not present

## 2022-07-12 DIAGNOSIS — K5732 Diverticulitis of large intestine without perforation or abscess without bleeding: Secondary | ICD-10-CM | POA: Diagnosis not present

## 2022-07-12 DIAGNOSIS — I739 Peripheral vascular disease, unspecified: Secondary | ICD-10-CM

## 2022-07-12 DIAGNOSIS — L219 Seborrheic dermatitis, unspecified: Secondary | ICD-10-CM | POA: Diagnosis not present

## 2022-07-12 LAB — CBC WITH DIFFERENTIAL/PLATELET
Basophils Absolute: 0.1 10*3/uL (ref 0.0–0.1)
Basophils Relative: 0.7 % (ref 0.0–3.0)
Eosinophils Absolute: 0.5 10*3/uL (ref 0.0–0.7)
Eosinophils Relative: 5 % (ref 0.0–5.0)
HCT: 41.1 % (ref 39.0–52.0)
Hemoglobin: 13.2 g/dL (ref 13.0–17.0)
Lymphocytes Relative: 19.3 % (ref 12.0–46.0)
Lymphs Abs: 2.1 10*3/uL (ref 0.7–4.0)
MCHC: 32 g/dL (ref 30.0–36.0)
MCV: 80.5 fl (ref 78.0–100.0)
Monocytes Absolute: 1 10*3/uL (ref 0.1–1.0)
Monocytes Relative: 9.7 % (ref 3.0–12.0)
Neutro Abs: 6.9 10*3/uL (ref 1.4–7.7)
Neutrophils Relative %: 65.3 % (ref 43.0–77.0)
Platelets: 344 10*3/uL (ref 150.0–400.0)
RBC: 5.11 Mil/uL (ref 4.22–5.81)
RDW: 15.8 % — ABNORMAL HIGH (ref 11.5–15.5)
WBC: 10.6 10*3/uL — ABNORMAL HIGH (ref 4.0–10.5)

## 2022-07-12 LAB — IBC + FERRITIN
Ferritin: 19 ng/mL — ABNORMAL LOW (ref 22.0–322.0)
Iron: 29 ug/dL — ABNORMAL LOW (ref 42–165)
Saturation Ratios: 8.4 % — ABNORMAL LOW (ref 20.0–50.0)
TIBC: 347.2 ug/dL (ref 250.0–450.0)
Transferrin: 248 mg/dL (ref 212.0–360.0)

## 2022-07-12 NOTE — Patient Instructions (Addendum)
If you are age 73 or older, your body mass index should be between 23-30. Your Body mass index is 32.95 kg/m. If this is out of the aforementioned range listed, please consider follow up with your Primary Care Provider.  If you are age 4 or younger, your body mass index should be between 19-25. Your Body mass index is 32.95 kg/m. If this is out of the aformentioned range listed, please consider follow up with your Primary Care Provider.   ________________________________________________________   Please go to the lab in the basement of our building to have lab work done as you leave today. Hit "B" for basement when you get on the elevator.  When the doors open the lab is on your left.  We will call you with the results. Thank you.  You have been scheduled for an endoscopy. Please follow written instructions given to you at your visit today. If you use inhalers (even only as needed), please bring them with you on the day of your procedure.   We will request your stool test records from Dr. [name].  Thank you for entrusting me with your care and for choosing [facility], Dr. Woodland Cellar

## 2022-07-12 NOTE — Progress Notes (Signed)
HPI :  71 year old male with history of Parkinson's, CAD with stent placement, OSA on CPAP, GERD, diverticulitis, here for follow-up visit for heme positive stool.  He reports having a positive stool test in recent months.  I do not see any records of that on file in his chart today, done by Dr. Jacquiline Doe office.  He states he was noted at his yearly physical.  He denies any bright red blood per rectum however states he has intermittent dark black stools in recent months.  He thinks the last time he noticed this was about a month ago.  He does not take any iron pills.  Aside from these episodes, he had moves his bowels once daily and states he is pretty regular.  He takes a regular aspirin every day, but denies any other NSAID use.  Uses Tylenol as needed for pains.  He denies any abdominal pains.  He has been on generic Nexium 40 mg once daily for a long time.  He states his GERD is pretty well controlled on the regimen.  He denies any dysphagia.  No reported history of ulcer disease.  He states lower dose of PPIs in the past has not worked as well for him.  His father had esophageal cancer he thinks around age 43 or so, no family history of colon cancer that he is aware of.  He does have a history of asthma, is on Xolair shots and states this works well for him and his asthma is well controlled.  He has had a few episodes of diverticulitis in recent years.  In fact he had a colonoscopy with me in September of last year to follow-up left-sided diverticulitis he had recently had.  He had 6 diminutive adenomas removed at that time and I recommended a repeat exam in 3 years.  He had an EGD in 2008 which showed a benign Schatzki ring, done for dysphagia at the time.  Otherwise looked okay.  He has had a few CT scans over the past few years for diverticulitis.  They have noted him to have hepatic steatosis as well without evidence of cirrhosis.  He denies any alcohol use now but previously was having at  least 3-4 beers per day with additional liquor on top of that.  He states he drank like this for about 10 years and stopped about 3 years ago.  No known history of cirrhosis.  He has chronic back pain, sounds like he is having surgery or procedure this upcoming Friday for nerve stimulator?  He is hoping this will help him cope with chronic back pain.  Otherwise denies any constipation, moving his bowels daily.  Most recent labs in our system show a hemoglobin of 11s back in June 2023, MCV 80s.   Prior workup: Colonoscopy 06/01/2021 - 6 diminutive adenomatous polyps with diverticulosis recall 3 years     10/28/2021 CT abdomen pelvis without contrast 1. Abnormal mesenteric stranding along the junction of the descending and sigmoid colon. There is substantial diverticulosis in this region and I would favor diverticulitis over an unrelated distal colitis. No extraluminal gas or abscess. 2. Complete atelectasis of the right middle lobe. I do not see a definite central obstructing lesion, but the right middle lobe bronchus and its branches appear narrow. A specific cause for the right middle lobe atelectasis is not identified on today's noncontrast abdomen CT. 3. Other imaging findings of potential clinical significance: Aortic Atherosclerosis (ICD10-I70.0). Coronary atherosclerosis. Diffuse hepatic steatosis. Bilateral mild foraminal impingement  at L5-S1.  EGD 06/2007 - benign shatski ring, otherwise normal  Echo 04/06/22: EF 64%, mild LVH   CT abdomen  pelvis without contrast 02/28/22: IMPRESSION: Colonic diverticulosis with stranding around the distal descending colon and proximal sigmoid colon concerning for active diverticulitis. Hepatic steatosis. Aortic atherosclerosis.   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[place])    left arm tremor   Osteoarthritis    lower back   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   Renal cell carcinoma 1997   Left kidney   Sleep apnea    wears CPAP   Sleep talking    Snoring 12/25/2013   Wears glasses      Past Surgical History:  Procedure Laterality Date   ANTERIOR CERVICAL DECOMP/DISCECTOMY FUSION N/A 08/27/2014   Procedure: Cervical six-seven anterior cervical decompression fusion with removal of hardware at Cervical five-six;  Surgeon: Erline Levine, MD;  Location: [place] [facility];  Service: Neurosurgery;  Laterality: N/A;  Cervical six-seven anterior cervical decompression fusion with removal of hardware at Cervical five-six   BACK SURGERY     1992, 2013 lower back   CARDIAC CATHETERIZATION  05/10/2007   Minimal CAD, normal LV systolic function, medical management   CARDIAC CATHETERIZATION  04/08/2008   RCA ulcerated 70-80% stenosis, stented with a 3x32m Endeavor stent at 13atm for 50sec, reduced from 80% ulcerated stenosis to 0%.   CARDIAC CATHETERIZATION  05/07/2009   50% distal left main disease-IVUS or flow wire too dangerous in particular setting to perform intervention.   CARDIAC CATHETERIZATION  03/18/2010   Medical management   CARDIOVASCULAR STRESS TEST  02/09/2012   Normal, no significant wall abnormalities noted   CARPAL TUNNEL RELEASE Bilateral 2000   COLONOSCOPY     COLONOSCOPY W/ BIOPSIES AND POLYPECTOMY     COLONOSCOPY WITH PROPOFOL  06/01/2021   [facility]    as infant groin   NECK SURGERY  2005   NEPHRECTOMY Left 1997   secondary to cancer   TOTAL KNEE ARTHROPLASTY Left 2005   TOTAL KNEE ARTHROPLASTY Right 04/13/2018   Procedure: RIGHT TOTAL KNEE ARTHROPLASTY;  Surgeon: NNetta Cedars MD;  [place] [facility] [place]  Service: Orthopedics;  Laterality: Right;   TRANSURETHRAL RESECTION OF PROSTATE N/A 03/25/2021   Procedure: TRANSURETHRAL RESECTION OF THE PROSTATE (TURP);  Surgeon: DFranchot Gallo MD;  [facility]  Service: Urology;  Laterality: N/A;   Family History  Problem Relation Age of Onset   Asthma Mother    Heart disease Mother    Hypertension Mother    Thyroid disease Mother    Esophageal cancer Father    Barrett's esophagus Father    Bone cancer Father        mets  from esophagus   Heart disease Father    Cancer Father    Depression Father    Anxiety disorder Father    Alcoholism Father    Obesity Father    Diabetes Sister    Cancer Sister        Cervical cancer   Colon cancer Maternal Uncle    Heart disease Paternal Grandfather    Heart attack Paternal Grandfather 77   Stomach cancer Neg Hx    Colon polyps Neg Hx    Rectal cancer Neg Hx    Social History   Tobacco Use   Smoking status: Never   Smokeless tobacco: Never  Vaping Use   Vaping Use: Never used  Substance Use Topics   Alcohol use: Not Currently   Drug use: No   Current Outpatient Medications  Medication Sig Dispense Refill   amLODipine (NORVASC) 10 MG tablet Take 10 mg by mouth daily.     ARIPiprazole (ABILIFY) 5 MG tablet Take 5 mg by mouth every morning.     aspirin 325 MG EC tablet Take 325 mg by mouth daily.     Budeson-Glycopyrrol-Formoterol (BREZTRI AEROSPHERE) 160-9-4.8 MCG/ACT AERO Inhale 2 puffs into the lungs in the morning and at bedtime. 11 g 5   cefadroxil (DURICEF) 500 MG capsule Take 1 capsule (500 mg total) by mouth 2 (two) times daily. 12 capsule 0   DULoxetine (CYMBALTA) 60 MG capsule Take  60 mg by mouth every morning.     ergocalciferol (VITAMIN D2) 1.25 MG (50000 UT) capsule Take 50,000 Units by mouth once a week. Saturday     esomeprazole (NEXIUM) 40 MG capsule Take 1 capsule (40 mg total) by mouth 2 (two) times daily. 60 capsule 5   Evolocumab (REPATHA SURECLICK) 267 MG/ML SOAJ Inject 1 Pen into the skin every 14 (fourteen) days. 2 mL 11   fluticasone (FLOVENT HFA) 110 MCG/ACT inhaler 2 puffs twice a day with a spacer to prevent cough or wheeze. Rinse mouth after use. 1 each 5   isosorbide mononitrate (IMDUR) 30 MG 24 hr tablet take 1 tablet by mouth once daily (Patient taking differently: at bedtime.) 15 tablet 0   ketoconazole (NIZORAL) 2 % cream Apply topically daily.     levothyroxine (SYNTHROID) 25 MCG tablet Take 25 mcg by mouth every morning.     levothyroxine (SYNTHROID) 50 MCG tablet Take 50 mcg by mouth daily before breakfast.     montelukast (SINGULAIR) 10 MG tablet Take 1 tablet (10 mg total) by mouth at bedtime. 30 tablet 5   Multiple Vitamin (MULTIVITAMIN) tablet Take 1 tablet by mouth at bedtime.      omalizumab [name]) 150 MG/ML prefilled syringe Inject 150 mg into the skin every 28 (twenty-eight) days. 1 mL 11   Spacer/Aero-Holding Chambers (OPTICHAMBER DIAMOND) MISC 1 each by Other route daily. Use as directed with inhaler. 1 each 1   telmisartan (MICARDIS) 20 MG tablet Take 20 mg by mouth daily.     testosterone cypionate (DEPOTESTOSTERONE CYPIONATE) 200 MG/ML injection Inject 200 mg into the muscle every 30 (thirty) days.     traZODone (DESYREL) 150 MG tablet Take 150 mg by mouth at bedtime.     traZODone (DESYREL) 50 MG tablet Take 1 tablet (50 mg total) by mouth at bedtime. 90 tablet 3   vitamin C (ASCORBIC ACID) 500 MG tablet Take 500 mg by mouth 2 (two) times daily.      acetaminophen (TYLENOL) 500 MG tablet Take 1,500 mg by mouth every  6 (six) hours as needed for mild pain. (Patient not taking: Reported on 05/04/2022)     albuterol (PROVENTIL) (2.5  MG/3ML) 0.083% nebulizer solution Take 3 mLs (2.5 mg total) by nebulization every 6 (six) hours as needed for wheezing or shortness of breath. (Patient not taking: Reported on 05/04/2022) 75 mL 12   albuterol (VENTOLIN HFA) 108 (90 Base) MCG/ACT inhaler Inhale 2 puffs into the lungs every 4 (four) hours as needed for wheezing or shortness of breath. Can inhale two puffs every four to six hours as needed for cough, wheeze, shortness of breath, or chest tightness. (Patient not taking: Reported on 05/04/2022) 18 g 1   betamethasone dipropionate 0.05 % cream Apply 1 application topically daily as needed (dry skin). (Patient not taking: Reported on 05/04/2022)     furosemide (LASIX) 20 MG tablet Take 20 mg by mouth daily as needed for edema. (Patient not taking: Reported on 05/04/2022)     hyoscyamine (LEVSIN) 0.125 MG tablet Take 1 tablet (0.125 mg total) by mouth every 6 (six) hours as needed for cramping. (Patient not taking: Reported on 05/04/2022) 30 tablet 0   ipratropium-albuterol (DUONEB) 0.5-2.5 (3) MG/3ML SOLN Take 3 mLs by nebulization every 6 (six) hours as needed (wheezing, coughing, shortness of breath). (Patient not taking: Reported on 05/04/2022) 360 mL 1   metroNIDAZOLE (METROCREAM) 0.75 % cream Apply topically daily. (Patient not taking: Reported on 05/04/2022)     ondansetron (ZOFRAN) 4 MG tablet Take 4 mg by mouth daily as needed for nausea/vomiting. (Patient not taking: Reported on 05/04/2022)     oxyCODONE (OXY IR/ROXICODONE) 5 MG immediate release tablet Take 1 tablet (5 mg total) by mouth every 4 (four) hours as needed for moderate pain. (Patient not taking: Reported on 05/04/2022) 10 tablet 0   potassium chloride (KLOR-CON) 10 MEQ tablet Take 10 mEq by mouth daily as needed (low levels). (Patient not taking: Reported on 05/04/2022)     Current Facility-Administered Medications  Medication Dose Route Frequency Provider Last Rate Last Admin   omalizumab [name]) injection 150 mg  150 mg  Subcutaneous Q28 days Dara Hoyer, FNP   150 mg at 06/20/22 1503   Allergies  Allergen Reactions   Contrast Media [Iodinated Contrast Media] Other (See Comments)    Was told not to take d/t pt only having 1 kidney   Nsaids Other (See Comments)    Was told not to take d/t pt only having 1 kidney   Penicillins Hives, Itching and Other (See Comments)    Tolerates Cefepime Has patient had a PCN reaction causing immediate rash, facial/tongue/throat swelling, SOB or lightheadedness with hypotension: Unknown Has patient had a PCN reaction causing severe rash involving mucus membranes or skin necrosis: Unknown Has patient had a PCN reaction that required hospitalization: Unknown Has patient had a PCN reaction occurring within the last 10 years: No If all of the above answers are "NO", then may proceed with Cephalosporin use.    Sulfonamide Derivatives Hives and Itching   Pholcodine    Codeine Nausea Only   Statins Other (See Comments)    Myalgias     Review of Systems: All systems reviewed and negative except where noted in HPI.   Mild anemia June 2023, Hgb 11s, MCV 80s  Physical Exam: BP 124/72   Pulse 91   Ht '5\' 7"'$  (1.702 m)   Wt 210 lb 6 oz (95.4 kg)   BMI 32.95 kg/m  Constitutional: Pleasant,well-developed, male in no acute distress. Neurological: Alert and  oriented to person place and time. Psychiatric: Normal mood and affect. Behavior is normal.   ASSESSMENT: 71 y.o. male here for assessment of the following  1. Heme positive stool   2. Dark stools   3. Gastroesophageal reflux disease, unspecified whether esophagitis present   4. Long-term current use of proton pump inhibitor therapy   5. Family history of esophageal cancer   6. Diverticulitis of colon   7. History of colon polyps   8. Fatty liver    Reportedly with heme positive stool test at his yearly physical.  He denies any bright red blood per rectum but has admitted to intermittent dark stools in the setting  of aspirin use, he is also compliant with his PPI for history of reflux.  Discussed differential diagnosis.  His colonoscopy was within the past year without any high risk lesions.  With history of dark stools concerning for possible intermittent upper tract blood loss, he has had mild anemia back in June.  His last EGD was in 2008, given dark stools and mild anemia, recommend EGD to clear his upper tract.  Discussed risk benefits of this and anesthesia with him and he wants to proceed.  We will also check follow-up CBC with iron studies to make sure no iron deficiency anemia.  I think repeating colonoscopy is likely low yield but will await his course and await stool test from Dr. Jacquiline Doe office to confirm findings.  He has had recurrent diverticulitis however has had colonoscopy in between episodes showing no high risk pathology.  Should he have recurrent diverticulitis he needs to let us know, low threshold for empiric antibiotics.  He otherwise has history of fatty liver, could be related to history of EtOH, has stopped drinking alcohol and will maintain sobriety.  We will monitor his LFTs, they have been normal.  Discussed reflux in general, long-term want to use lowest dose of PPI needed to control symptoms.  Discussed long-term risk of chronic PPI use.  Will await EGD first, if no high risk pathology may reduce his dose of Nexium if he tolerates it.  He agrees   PLAN: - schedule EGD at the [facility] - lab today CBC and TIBC / ferritin panel - obtain results of stool test from Dr. Jacquiline Doe office. Holding off on colonoscopy for now while we await EGD initially, given relatively recent colonoscopy without high risk pathology. Normally would next be due for colonoscopy in 05/2024 - history of EtOH - stopped drinking, monitor LFTs, normal, weight loss - pending EGD, may reduce dose of nexium over time as tolerated for GERD. Discussed long term risks of chronic PPI use  Jolly Mango, MD [facility]

## 2022-07-13 ENCOUNTER — Other Ambulatory Visit: Payer: Self-pay

## 2022-07-13 DIAGNOSIS — D509 Iron deficiency anemia, unspecified: Secondary | ICD-10-CM

## 2022-07-13 MED ORDER — FERROUS SULFATE 325 (65 FE) MG PO TABS
325.0000 mg | ORAL_TABLET | Freq: Every day | ORAL | 3 refills | Status: DC
Start: 2022-07-13 — End: 2022-12-20

## 2022-07-15 DIAGNOSIS — M47812 Spondylosis without myelopathy or radiculopathy, cervical region: Secondary | ICD-10-CM | POA: Diagnosis not present

## 2022-07-15 DIAGNOSIS — M47816 Spondylosis without myelopathy or radiculopathy, lumbar region: Secondary | ICD-10-CM | POA: Diagnosis not present

## 2022-07-15 DIAGNOSIS — G894 Chronic pain syndrome: Secondary | ICD-10-CM | POA: Diagnosis not present

## 2022-07-18 ENCOUNTER — Ambulatory Visit: Payer: Medicare PPO

## 2022-07-20 ENCOUNTER — Telehealth: Payer: Self-pay | Admitting: Gastroenterology

## 2022-07-20 NOTE — Telephone Encounter (Signed)
Labs arrived:  Dr. Jacquiline Doe office  04/18/22: - occult stool (+)   We will await his pending EGD. He does have an IDA. If EGD is negative for cause will need to consider repeating colonoscopy but he had an exam one year ago, I think less likely to find cause there but await EGD first with further recommendations.

## 2022-07-22 ENCOUNTER — Ambulatory Visit (INDEPENDENT_AMBULATORY_CARE_PROVIDER_SITE_OTHER): Payer: Medicare PPO

## 2022-07-22 DIAGNOSIS — J4550 Severe persistent asthma, uncomplicated: Secondary | ICD-10-CM

## 2022-07-22 MED ORDER — OMALIZUMAB 150 MG/ML ~~LOC~~ SOSY
150.0000 mg | PREFILLED_SYRINGE | SUBCUTANEOUS | Status: AC
  Administered 2022-07-22 – 2022-08-23 (×2): 150 mg via SUBCUTANEOUS

## 2022-08-02 ENCOUNTER — Ambulatory Visit
Admission: RE | Admit: 2022-08-02 | Discharge: 2022-08-02 | Disposition: A | Discharge status: Home / Self Care | Payer: Medicare PPO | Source: Ambulatory Visit | Attending: Urology | Admitting: Urology

## 2022-08-02 DIAGNOSIS — F5221 Male erectile disorder: Secondary | ICD-10-CM | POA: Diagnosis not present

## 2022-08-02 DIAGNOSIS — C61 Malignant neoplasm of prostate: Secondary | ICD-10-CM

## 2022-08-02 DIAGNOSIS — E291 Testicular hypofunction: Secondary | ICD-10-CM | POA: Diagnosis not present

## 2022-08-03 ENCOUNTER — Ambulatory Visit (AMBULATORY_SURGERY_CENTER): Payer: Medicare PPO | Admitting: Gastroenterology

## 2022-08-03 ENCOUNTER — Encounter: Payer: Self-pay | Admitting: Gastroenterology

## 2022-08-03 VITALS — BP 129/67 | HR 70 | Temp 97.5°F | Resp 23 | Ht 67.0 in | Wt 210.0 lb

## 2022-08-03 DIAGNOSIS — K2950 Unspecified chronic gastritis without bleeding: Secondary | ICD-10-CM | POA: Diagnosis not present

## 2022-08-03 DIAGNOSIS — J45909 Unspecified asthma, uncomplicated: Secondary | ICD-10-CM | POA: Diagnosis not present

## 2022-08-03 DIAGNOSIS — R195 Other fecal abnormalities: Secondary | ICD-10-CM | POA: Diagnosis not present

## 2022-08-03 DIAGNOSIS — D509 Iron deficiency anemia, unspecified: Secondary | ICD-10-CM

## 2022-08-03 DIAGNOSIS — K2951 Unspecified chronic gastritis with bleeding: Secondary | ICD-10-CM | POA: Diagnosis not present

## 2022-08-03 DIAGNOSIS — E039 Hypothyroidism, unspecified: Secondary | ICD-10-CM | POA: Diagnosis not present

## 2022-08-03 DIAGNOSIS — G4730 Sleep apnea, unspecified: Secondary | ICD-10-CM | POA: Diagnosis not present

## 2022-08-03 DIAGNOSIS — I10 Essential (primary) hypertension: Secondary | ICD-10-CM | POA: Diagnosis not present

## 2022-08-03 DIAGNOSIS — F419 Anxiety disorder, unspecified: Secondary | ICD-10-CM | POA: Diagnosis not present

## 2022-08-03 MED ORDER — SODIUM CHLORIDE 0.9 % IV SOLN: 500.0000 mL | Freq: Once | INTRAVENOUS | Status: DC

## 2022-08-03 NOTE — Progress Notes (Signed)
Called to room to assist during endoscopic procedure.  Patient ID and intended procedure confirmed with present staff. Received instructions for my participation in the procedure from the performing physician.  

## 2022-08-03 NOTE — Progress Notes (Signed)
Sedate, gd SR, tolerated procedure well, VSS, report to RN 

## 2022-08-03 NOTE — Progress Notes (Signed)
Pt's states no medical or surgical changes since previsit or office visit. 

## 2022-08-03 NOTE — Progress Notes (Signed)
[facility] History and Physical   Primary Care Physician:  Burnard Bunting, MD   Reason for Procedure:   Heme positive dark stools, IDA  Plan:    EGD     HPI: Jared [name] is a 71 y.o. male  here for EGD to evaluate history of dark stools that were heme positive and IDA. On nexium for history of GERD. Colonoscopy done in 2022 without high risk lesions. HE understands and wishes to proceed.  I have discussed risks / benefits of anesthesia and endoscopic procedure with [name] and they wish to proceed with the exams as outlined today.    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   Osteoarthritis    lower back   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   Renal cell carcinoma 1997   Left kidney   Sleep apnea    wears CPAP   Sleep talking    Snoring 12/25/2013   Wears glasses     Past Surgical History:  Procedure Laterality Date   ANTERIOR CERVICAL DECOMP/DISCECTOMY FUSION N/A 08/27/2014   Procedure: Cervical six-seven anterior cervical decompression fusion with removal of hardware at Cervical five-six;  Surgeon: Erline Levine, MD;  Location: [facility];  Service: Neurosurgery;  Laterality: N/A;  Cervical six-seven anterior cervical decompression fusion with removal of hardware at Cervical five-six   BACK SURGERY     1992, 2013 lower back   CARDIAC CATHETERIZATION  05/10/2007   Minimal CAD, normal LV  systolic function, medical management   CARDIAC CATHETERIZATION  04/08/2008   RCA ulcerated 70-80% stenosis, stented with a 3x51m Endeavor stent at 13atm for 50sec, reduced from 80% ulcerated stenosis to 0%.   CARDIAC CATHETERIZATION  05/07/2009   50% distal left main disease-IVUS or flow wire too dangerous in particular setting to perform intervention.   CARDIAC CATHETERIZATION  03/18/2010   Medical management   CARDIOVASCULAR STRESS TEST  02/09/2012   Normal, no significant wall abnormalities noted   CARPAL TUNNEL RELEASE Bilateral 2000   COLONOSCOPY     COLONOSCOPY W/ BIOPSIES AND POLYPECTOMY     COLONOSCOPY WITH PROPOFOL  06/01/2021   [facility]    as infant groin   NECK SURGERY  2005   NEPHRECTOMY Left 1997   secondary to cancer   TOTAL KNEE ARTHROPLASTY Left 2005   TOTAL KNEE ARTHROPLASTY Right 04/13/2018   Procedure: RIGHT TOTAL KNEE ARTHROPLASTY;  Surgeon: NNetta Cedars MD;  [place] [facility]  Service: Orthopedics;  Laterality: Right;   TRANSURETHRAL RESECTION OF PROSTATE N/A 03/25/2021   Procedure: TRANSURETHRAL RESECTION OF THE PROSTATE (TURP);  Surgeon: DFranchot Gallo MD;  [facility]  Service: Urology;  Laterality: N/A;    Prior to Admission medications  Medication Sig Start Date End Date Taking? Authorizing Provider  ARIPiprazole (ABILIFY) 5 MG tablet Take 5 mg by mouth every morning. 05/21/20  Yes [provider]  aspirin 325 MG EC tablet Take 325 mg by mouth daily.   Yes [provider]  DULoxetine (CYMBALTA) 60 MG capsule Take 60 mg by mouth every morning. 04/22/21  Yes [provider]  esomeprazole (NEXIUM) 40 MG capsule Take 1 capsule (40 mg total) by mouth 2 (two) times daily. 04/27/22  Yes Kozlow, Donnamarie Poag, MD  ferrous sulfate 325 (65 FE) MG tablet Take 1 tablet (325 mg total) by mouth daily with breakfast. 07/13/22   Yes Mazal Ebey, Carlota Raspberry, MD  isosorbide mononitrate (IMDUR) 30 MG 24 hr tablet take 1 tablet by mouth once daily Patient taking differently: at bedtime. 06/21/17  Yes Lorretta Harp, MD  levothyroxine (SYNTHROID) 50 MCG tablet Take 50 mcg by mouth daily before breakfast.   Yes [provider]  montelukast (SINGULAIR) 10 MG tablet Take 1 tablet (10 mg total) by mouth at bedtime. 05/10/22  Yes Kozlow, Donnamarie Poag, MD  Multiple Vitamin (MULTIVITAMIN) tablet Take 1 tablet by mouth at bedtime.    Yes [provider]  telmisartan (MICARDIS) 20 MG tablet Take 20 mg by mouth daily. 02/20/20  Yes [provider]  traZODone (DESYREL) 150 MG tablet Take 150 mg by mouth at bedtime.   Yes [provider]  traZODone (DESYREL) 50 MG tablet Take 1 tablet (50 mg total) by mouth at bedtime. 02/17/22  Yes Lomax, [name], NP  vitamin C (ASCORBIC ACID) 500 MG tablet Take 500 mg by mouth 2 (two) times daily.    Yes [provider]  acetaminophen (TYLENOL) 500 MG tablet Take 1,500 mg by mouth every 6 (six) hours as needed for mild pain. Patient not taking: Reported on 05/04/2022    [provider]  albuterol (PROVENTIL) (2.5 MG/3ML) 0.083% nebulizer solution Take 3 mLs (2.5 mg total) by nebulization every 6 (six) hours as needed for wheezing or shortness of breath. Patient not taking: Reported on 05/04/2022 04/04/22   Clemon Chambers, MD  albuterol (VENTOLIN HFA) 108 (90 Base) MCG/ACT inhaler Inhale 2 puffs into the lungs every 4 (four) hours as needed for wheezing or shortness of breath. Can inhale two puffs every four to six hours as needed for cough, wheeze, shortness of breath, or chest tightness. Patient not taking: Reported on 05/04/2022 04/19/22   Jiles Prows, MD  amLODipine (NORVASC) 10 MG tablet Take 10 mg by mouth daily. Patient not taking: Reported on 08/03/2022    [provider]  betamethasone dipropionate 0.05 % cream Apply 1 application topically daily as  needed (dry skin). Patient not taking: Reported on 05/04/2022 05/26/21   [provider]  Budeson-Glycopyrrol-Formoterol (BREZTRI AEROSPHERE) 160-9-4.8 MCG/ACT AERO Inhale 2 puffs into the lungs in the morning and at bedtime. 04/19/22   Kozlow, Donnamarie Poag, MD  cefadroxil (DURICEF) 500 MG capsule Take 1 capsule (500 mg total) by mouth 2 (two) times daily. 03/03/22   Geradine Girt, DO  ergocalciferol (VITAMIN D2) 1.25 MG (50000 UT) capsule Take 50,000 Units by mouth once a week. Saturday    [provider]  Evolocumab (REPATHA SURECLICK) 195 MG/ML SOAJ Inject 1 Pen into the skin every 14 (fourteen) days. 03/23/22   Lorretta Harp, MD  fluticasone (FLOVENT HFA) 110 MCG/ACT inhaler 2 puffs twice a day with a spacer to prevent cough or wheeze. Rinse mouth after use. 04/19/22  Kozlow, Donnamarie Poag, MD  furosemide (LASIX) 20 MG tablet Take 20 mg by mouth daily as needed for edema. Patient not taking: Reported on 05/04/2022    [provider]  hyoscyamine (LEVSIN) 0.125 MG tablet Take 1 tablet (0.125 mg total) by mouth every 6 (six) hours as needed for cramping. Patient not taking: Reported on 05/04/2022 11/15/21   Vladimir Crofts, PA-C  ipratropium-albuterol (DUONEB) 0.5-2.5 (3) MG/3ML SOLN Take 3 mLs by nebulization every 6 (six) hours as needed (wheezing, coughing, shortness of breath). Patient not taking: Reported on 05/04/2022 05/25/21   Jiles Prows, MD  ketoconazole (NIZORAL) 2 % cream Apply topically daily. 04/06/22   [provider]  levothyroxine (SYNTHROID) 25 MCG tablet Take 25 mcg by mouth every morning. Patient not taking: Reported on 08/03/2022 03/30/21   [provider]  metroNIDAZOLE (METROCREAM) 0.75 % cream Apply topically daily. Patient not taking: Reported on 05/04/2022 04/06/22   [provider]  omalizumab [name]) 150 MG/ML prefilled syringe Inject 150 mg into the skin every 28 (twenty-eight) days. Patient not taking: Reported on 08/03/2022 01/18/22    Jiles Prows, MD  ondansetron (ZOFRAN) 4 MG tablet Take 4 mg by mouth daily as needed for nausea/vomiting. Patient not taking: Reported on 05/04/2022 10/26/21   [provider]  oxyCODONE (OXY IR/ROXICODONE) 5 MG immediate release tablet Take 1 tablet (5 mg total) by mouth every 4 (four) hours as needed for moderate pain. Patient not taking: Reported on 05/04/2022 03/03/22   Geradine Girt, DO  potassium chloride (KLOR-CON) 10 MEQ tablet Take 10 mEq by mouth daily as needed (low levels). Patient not taking: Reported on 05/04/2022    [provider]  Spacer/Aero-Holding Chambers [facility] DIAMOND) [place] 1 each by Other route daily. Use as directed with inhaler. 04/19/22   Kozlow, Donnamarie Poag, MD  testosterone cypionate (DEPOTESTOSTERONE CYPIONATE) 200 MG/ML injection Inject 200 mg into the muscle every 30 (thirty) days. Patient not taking: Reported on 08/03/2022 04/28/22   [provider]    Current Outpatient Medications  Medication Sig Dispense Refill   ARIPiprazole (ABILIFY) 5 MG tablet Take 5 mg by mouth every morning.     aspirin 325 MG EC tablet Take 325 mg by mouth daily.     DULoxetine (CYMBALTA) 60 MG capsule Take 60 mg by mouth every morning.     esomeprazole (NEXIUM) 40 MG capsule Take 1 capsule (40 mg total) by mouth 2 (two) times daily. 60 capsule 5   ferrous sulfate 325 (65 FE) MG tablet Take 1 tablet (325 mg total) by mouth daily with breakfast.  3   isosorbide mononitrate (IMDUR) 30 MG 24 hr tablet take 1 tablet by mouth once daily (Patient taking differently: at bedtime.) 15 tablet 0   levothyroxine (SYNTHROID) 50 MCG tablet Take 50 mcg by mouth daily before breakfast.     montelukast (SINGULAIR) 10 MG tablet Take 1 tablet (10 mg total) by mouth at bedtime. 30 tablet 5   Multiple Vitamin (MULTIVITAMIN) tablet Take 1 tablet by mouth at bedtime.      telmisartan (MICARDIS) 20 MG tablet Take 20 mg by mouth daily.     traZODone (DESYREL) 150 MG tablet Take 150  mg by mouth at bedtime.     traZODone (DESYREL) 50 MG tablet Take 1 tablet (50 mg total) by mouth at bedtime. 90 tablet 3   vitamin C (ASCORBIC ACID) 500 MG tablet Take 500 mg by mouth 2 (two) times daily.  acetaminophen (TYLENOL) 500 MG tablet Take 1,500 mg by mouth every 6 (six) hours as needed for mild pain. (Patient not taking: Reported on 05/04/2022)     albuterol (PROVENTIL) (2.5 MG/3ML) 0.083% nebulizer solution Take 3 mLs (2.5 mg total) by nebulization every 6 (six) hours as needed for wheezing or shortness of breath. (Patient not taking: Reported on 05/04/2022) 75 mL 12   albuterol (VENTOLIN HFA) 108 (90 Base) MCG/ACT inhaler Inhale 2 puffs into the lungs every 4 (four) hours as needed for wheezing or shortness of breath. Can inhale two puffs every four to six hours as needed for cough, wheeze, shortness of breath, or chest tightness. (Patient not taking: Reported on 05/04/2022) 18 g 1   amLODipine (NORVASC) 10 MG tablet Take 10 mg by mouth daily. (Patient not taking: Reported on 08/03/2022)     betamethasone dipropionate 0.05 % cream Apply 1 application topically daily as needed (dry skin). (Patient not taking: Reported on 05/04/2022)     Budeson-Glycopyrrol-Formoterol (BREZTRI AEROSPHERE) 160-9-4.8 MCG/ACT AERO Inhale 2 puffs into the lungs in the morning and at bedtime. 11 g 5   cefadroxil (DURICEF) 500 MG capsule Take 1 capsule (500 mg total) by mouth 2 (two) times daily. 12 capsule 0   ergocalciferol (VITAMIN D2) 1.25 MG (50000 UT) capsule Take 50,000 Units by mouth once a week. Saturday     Evolocumab (REPATHA SURECLICK) 697 MG/ML SOAJ Inject 1 Pen into the skin every 14 (fourteen) days. 2 mL 11   fluticasone (FLOVENT HFA) 110 MCG/ACT inhaler 2 puffs twice a day with a spacer to prevent cough or wheeze. Rinse mouth after use. 1 each 5   furosemide (LASIX) 20 MG tablet Take 20 mg by mouth daily as needed for edema. (Patient not taking: Reported on 05/04/2022)     hyoscyamine (LEVSIN) 0.125  MG tablet Take 1 tablet (0.125 mg total) by mouth every 6 (six) hours as needed for cramping. (Patient not taking: Reported on 05/04/2022) 30 tablet 0   ipratropium-albuterol (DUONEB) 0.5-2.5 (3) MG/3ML SOLN Take 3 mLs by nebulization every 6 (six) hours as needed (wheezing, coughing, shortness of breath). (Patient not taking: Reported on 05/04/2022) 360 mL 1   ketoconazole (NIZORAL) 2 % cream Apply topically daily.     levothyroxine (SYNTHROID) 25 MCG tablet Take 25 mcg by mouth every morning. (Patient not taking: Reported on 08/03/2022)     metroNIDAZOLE (METROCREAM) 0.75 % cream Apply topically daily. (Patient not taking: Reported on 05/04/2022)     omalizumab [name]) 150 MG/ML prefilled syringe Inject 150 mg into the skin every 28 (twenty-eight) days. (Patient not taking: Reported on 08/03/2022) 1 mL 11   ondansetron (ZOFRAN) 4 MG tablet Take 4 mg by mouth daily as needed for nausea/vomiting. (Patient not taking: Reported on 05/04/2022)     oxyCODONE (OXY IR/ROXICODONE) 5 MG immediate release tablet Take 1 tablet (5 mg total) by mouth every 4 (four) hours as needed for moderate pain. (Patient not taking: Reported on 05/04/2022) 10 tablet 0   potassium chloride (KLOR-CON) 10 MEQ tablet Take 10 mEq by mouth daily as needed (low levels). (Patient not taking: Reported on 05/04/2022)     Spacer/Aero-Holding Chambers [facility] DIAMOND) MISC 1 each by Other route daily. Use as directed with inhaler. 1 each 1   testosterone cypionate (DEPOTESTOSTERONE CYPIONATE) 200 MG/ML injection Inject 200 mg into the muscle every 30 (thirty) days. (Patient not taking: Reported on 08/03/2022)     Current Facility-Administered Medications  Medication Dose Route Frequency Provider Last Rate  Last Admin   0.9 %  sodium chloride infusion  500 mL Intravenous Once Quantisha Marsicano, Carlota Raspberry, MD       omalizumab [name]) injection 150 mg  150 mg Subcutaneous Q28 days Dara Hoyer, FNP   150 mg at 06/20/22 1503   omalizumab (XOLAIR)  prefilled syringe 150 mg  150 mg Subcutaneous Q28 days Larose Kells, MD   150 mg at 07/22/22 0831    Allergies as of 08/03/2022 - Review Complete 08/03/2022  Allergen Reaction Noted   Contrast media [iodinated contrast media] Other (See Comments) 10/12/2011   Nsaids Other (See Comments) 10/12/2011   Penicillins Hives, Itching, and Other (See Comments)    Sulfonamide derivatives Hives and Itching    Pholcodine  04/04/2022   Codeine Nausea Only    Statins Other (See Comments) 07/04/2013    Family History  Problem Relation Age of Onset   Asthma Mother    Heart disease Mother    Hypertension Mother    Thyroid disease Mother    Esophageal cancer Father    Barrett's esophagus Father    Bone cancer Father        mets from esophagus   Heart disease Father    Cancer Father    Depression Father    Anxiety disorder Father    Alcoholism Father    Obesity Father    Diabetes Sister    Cancer Sister        Cervical cancer   Colon cancer Maternal Uncle    Heart disease Paternal Grandfather    Heart attack Paternal Grandfather 66   Stomach cancer Neg Hx    Colon polyps Neg Hx    Rectal cancer Neg Hx     Social History   Socioeconomic History   Marital status: Married    Spouse name: Not on file   Number of children: 1   Years of education: 14   Highest education level: Not on file  Occupational History   Occupation: OWNER. Landscape Design/horticulture    Employer: [employer]   Occupation: OWNER    Employer: [employer]    Comment: retired  Tobacco Use   Smoking status: Never   Smokeless tobacco: Never  Vaping Use   Vaping Use: Never used  Substance and Sexual Activity   Alcohol use: Not Currently   Drug use: No   Sexual activity: Not Currently  Other Topics Concern   Not on file  Social History Narrative   Patient is married [name]).   Patient drinks one cup of coffee but not everyday.   Patient has one child.   Patient has a college education.    Patient is right-handed.   Social Determinants of Health   Financial Resource Strain: Not on file  Food Insecurity: Not on file  Transportation Needs: Not on file  Physical Activity: Not on file  Stress: Not on file  Social Connections: Not on file  Intimate Partner Violence: Not on file    Review of Systems: All other review of systems negative except as mentioned in the HPI.  Physical Exam: Vital signs BP 129/68   Pulse 72   Temp (!) 97.5 F (36.4 C)   Ht '5\' 7"'$  (1.702 m)   Wt 210 lb (95.3 kg)   SpO2 95%   BMI 32.89 kg/m   General:   Alert,  Well-developed, pleasant and cooperative in NAD Lungs:  Clear throughout to auscultation.   Heart:  Regular rate and rhythm Abdomen:  Soft, nontender  and nondistended.   Neuro/Psych:  Alert and cooperative. Normal mood and affect. A and O x 3  Jolly Mango, MD [facility]

## 2022-08-03 NOTE — Op Note (Addendum)
[place] Patient Name: Jared [name] Procedure Date: 08/03/2022 1:24 PM MRN: [id number] Endoscopist: Remo Lipps P. Havery Moros , MD, [id number] Age: 71 Referring MD:  Date of Birth: [birth year] Gender: Male Account #: [id number] Procedure:                Upper GI endoscopy Indications:              Iron deficiency anemia, Heme positive stool,                            history of dark stools. On nexium. Colonoscopy                            05/2021 without any high risk lesions Medicines:                Monitored Anesthesia Care Procedure:                Pre-Anesthesia Assessment:                           - Prior to the procedure, a History and Physical                            was performed, and patient medications and                            allergies were reviewed. The patient's tolerance of                            previous anesthesia was also reviewed. The risks                            and benefits of the procedure and the sedation                            options and risks were discussed with the patient.                            All questions were answered, and informed consent                            was obtained. Prior Anticoagulants: The patient has                            taken no anticoagulant or antiplatelet agents. ASA                            Grade Assessment: III - A patient with severe                            systemic disease. After reviewing the risks and                            benefits, the patient was deemed in satisfactory  condition to undergo the procedure.                           After obtaining informed consent, the endoscope was                            passed under direct vision. Throughout the                            procedure, the patient's blood pressure, pulse, and                            oxygen saturations were monitored continuously. The                            Endoscope was  introduced through the mouth, and                            advanced to the second part of duodenum. The upper                            GI endoscopy was accomplished without difficulty.                            The patient tolerated the procedure well. Scope In: Scope Out: Findings:                 Esophagogastric landmarks were identified: the                            Z-line was found at 40 cm, the gastroesophageal                            junction was found at 40 cm and the upper extent of                            the gastric folds was found at 40 cm from the                            incisors.                           The exam of the esophagus was otherwise normal.                           The entire examined stomach was normal. Biopsies                            were taken with a cold forceps for Helicobacter                            pylori testing.                           The examined duodenum was  normal. Biopsies for                            histology were taken with a cold forceps for                            evaluation of celiac disease. Complications:            No immediate complications. Estimated blood loss:                            Minimal. Estimated Blood Loss:     Estimated blood loss was minimal. Impression:               - Esophagogastric landmarks identified.                           - Normal esophagus.                           - Normal stomach. Biopsied.                           - Normal examined duodenum. Biopsied.                           No cause for dark or heme positive stools. Biopsies                            taken in light of iron deficiency.                           Will need to consider colonoscopy or capsule                            endoscopy, will discuss with the patient. Recommendation:           - Patient has a contact number available for                            emergencies. The signs and symptoms of potential                             delayed complications were discussed with the                            patient. Return to normal activities tomorrow.                            Written discharge instructions were provided to the                            patient.                           - Resume previous diet.                           -  Continue present medications.                           - Await pathology results. Remo Lipps P. Alhaji Mcneal, MD 08/03/2022 1:42:59 PM This report has been signed electronically.

## 2022-08-03 NOTE — Patient Instructions (Addendum)
-   Resume previous diet. - Continue present medications. - Await pathology results.  YOU HAD AN ENDOSCOPIC PROCEDURE TODAY AT [facility]:   Refer to the procedure report that was given to you for any specific questions about what was found during the examination.  If the procedure report does not answer your questions, please call your gastroenterologist to clarify.  If you requested that your care partner not be given the details of your procedure findings, then the procedure report has been included in a sealed envelope for you to review at your convenience later.  YOU SHOULD EXPECT: Some feelings of bloating in the abdomen. Passage of more gas than usual.  Walking can help get rid of the air that was put into your GI tract during the procedure and reduce the bloating. If you had a lower endoscopy (such as a colonoscopy or flexible sigmoidoscopy) you may notice spotting of blood in your stool or on the toilet paper. If you underwent a bowel prep for your procedure, you may not have a normal bowel movement for a few days.  Please Note:  You might notice some irritation and congestion in your nose or some drainage.  This is from the oxygen used during your procedure.  There is no need for concern and it should clear up in a day or so.  SYMPTOMS TO REPORT IMMEDIATELY:  Following upper endoscopy (EGD)  Vomiting of blood or coffee ground material  New chest pain or pain under the shoulder blades  Painful or persistently difficult swallowing  New shortness of breath  Fever of 100F or higher  Black, tarry-looking stools  For urgent or emergent issues, a gastroenterologist can be reached at any hour by calling [phone]. Do not use MyChart messaging for urgent concerns.    DIET:  We do recommend a small meal at first, but then you may proceed to your regular diet.  Drink plenty of fluids but you should avoid alcoholic beverages for 24 hours.  ACTIVITY:  You should plan to  take it easy for the rest of today and you should NOT DRIVE or use heavy machinery until tomorrow (because of the sedation medicines used during the test).    FOLLOW UP: Our staff will call the number listed on your records the next business day following your procedure.  We will call around 7:15- 8:00 am to check on you and address any questions or concerns that you may have regarding the information given to you following your procedure. If we do not reach you, we will leave a message.     If any biopsies were taken you will be contacted by phone or by letter within the next 1-3 weeks.  Please call us at [phone] if you have not heard about the biopsies in 3 weeks.    SIGNATURES/CONFIDENTIALITY: You and/or your care partner have signed paperwork which will be entered into your electronic medical record.  These signatures attest to the fact that that the information above on your After Visit Summary has been reviewed and is understood.  Full responsibility of the confidentiality of this discharge information lies with you and/or your care-partner.

## 2022-08-04 ENCOUNTER — Ambulatory Visit
Admission: RE | Admit: 2022-08-04 | Discharge: 2022-08-04 | Disposition: A | Discharge status: Home / Self Care | Payer: Medicare PPO | Source: Ambulatory Visit | Attending: Internal Medicine | Admitting: Internal Medicine

## 2022-08-04 ENCOUNTER — Telehealth: Payer: Self-pay

## 2022-08-04 DIAGNOSIS — I998 Other disorder of circulatory system: Secondary | ICD-10-CM | POA: Diagnosis not present

## 2022-08-04 DIAGNOSIS — I70213 Atherosclerosis of native arteries of extremities with intermittent claudication, bilateral legs: Secondary | ICD-10-CM | POA: Diagnosis not present

## 2022-08-04 DIAGNOSIS — I739 Peripheral vascular disease, unspecified: Secondary | ICD-10-CM

## 2022-08-04 NOTE — Telephone Encounter (Signed)
  Follow up Call-     08/03/2022    1:06 PM 06/01/2021   10:05 AM  Call back number  Post procedure Call Back phone  # [phone]  Permission to leave phone message Yes Yes     Patient questions:  Do you have a fever, pain , or abdominal swelling? No. Pain Score  0 *  Have you tolerated food without any problems? Yes.    Have you been able to return to your normal activities? Yes.    Do you have any questions about your discharge instructions: Diet   No. Medications  No. Follow up visit  No.  Do you have questions or concerns about your Care? No.  Actions: * If pain score is 4 or above: No action needed, pain <4.

## 2022-08-05 ENCOUNTER — Telehealth: Payer: Self-pay | Admitting: Family Medicine

## 2022-08-05 DIAGNOSIS — F5105 Insomnia due to other mental disorder: Secondary | ICD-10-CM

## 2022-08-05 NOTE — Telephone Encounter (Signed)
Pt called stating that the traZODone (DESYREL) 150 MG tablet is not working for him and all he does is toss and turn all night and has not been able to sleep a lot for about a week. Please advise.

## 2022-08-05 NOTE — Telephone Encounter (Signed)
Per AL,NP last OV note from 02/17/22: " He called 12/2021 with worsening insomnia. Trazodone was increased to '150mg'$ . He called about a month later reporting daytime grogginess with increased dose and advised to reduce dose to 100-'125mg'$  daily. He reports that most recent, he has been taking '50mg'$  daily at bedtime and feels this dose if the best dose for him "   Called pt to clarify which dose he is taking of trazodone currently. Rx on file is for '50mg'$  po qhs. LVM for him to call back.  Advised Dr. Brett Fairy and AL,NP out of office until Monday. We can speak with AL,NP on recommendation then but needing to clarify how he is taking medication first. Advised we are open until 12pm today.

## 2022-08-05 NOTE — Telephone Encounter (Signed)
Pt called back, did not listen message. I relayed message I left. He was on '50mg'$  po qhd at last visit but went back to taking '150mg'$  po qhs around July 2023 d/t worsening insomnia. He states for last few weeks, trazodone ineffective, cannot sleep. Denies any increased stress, no illness or infection. Has not tried any other sleep medication. Aware AL,NP out until next week. I will send her to review to see what she recommends and then call him back early next week. He verbalized understanding.  When I review chart, he has tried/failed: melatonin, ambien, belsomra

## 2022-08-08 ENCOUNTER — Other Ambulatory Visit: Payer: Self-pay

## 2022-08-08 ENCOUNTER — Encounter: Payer: Self-pay | Admitting: Allergy

## 2022-08-08 ENCOUNTER — Ambulatory Visit (HOSPITAL_COMMUNITY)
Admission: RE | Admit: 2022-08-08 | Discharge: 2022-08-08 | Disposition: A | Discharge status: Home / Self Care | Payer: Medicare PPO | Source: Ambulatory Visit | Attending: Allergy | Admitting: Allergy

## 2022-08-08 ENCOUNTER — Ambulatory Visit: Payer: Medicare PPO | Admitting: Allergy

## 2022-08-08 VITALS — BP 130/86 | HR 77 | Temp 97.7°F | Resp 14 | Ht 65.5 in | Wt 201.6 lb

## 2022-08-08 DIAGNOSIS — J4550 Severe persistent asthma, uncomplicated: Secondary | ICD-10-CM | POA: Diagnosis not present

## 2022-08-08 DIAGNOSIS — J9811 Atelectasis: Secondary | ICD-10-CM | POA: Diagnosis not present

## 2022-08-08 DIAGNOSIS — R053 Chronic cough: Secondary | ICD-10-CM | POA: Insufficient documentation

## 2022-08-08 DIAGNOSIS — J302 Other seasonal allergic rhinitis: Secondary | ICD-10-CM

## 2022-08-08 DIAGNOSIS — K219 Gastro-esophageal reflux disease without esophagitis: Secondary | ICD-10-CM

## 2022-08-08 DIAGNOSIS — J4551 Severe persistent asthma with (acute) exacerbation: Secondary | ICD-10-CM | POA: Insufficient documentation

## 2022-08-08 DIAGNOSIS — J3089 Other allergic rhinitis: Secondary | ICD-10-CM | POA: Diagnosis not present

## 2022-08-08 DIAGNOSIS — R059 Cough, unspecified: Secondary | ICD-10-CM | POA: Diagnosis not present

## 2022-08-08 MED ORDER — BENZONATATE 100 MG PO CAPS: 100.0000 mg | ORAL_CAPSULE | Freq: Three times a day (TID) | ORAL | 0 refills | Status: DC | PRN

## 2022-08-08 MED ORDER — ALBUTEROL SULFATE HFA 108 (90 BASE) MCG/ACT IN AERS: 2.0000 | INHALATION_SPRAY | RESPIRATORY_TRACT | 1 refills | Status: DC | PRN

## 2022-08-08 MED ORDER — PREDNISONE 10 MG PO TABS: ORAL_TABLET | ORAL | 0 refills | Status: DC

## 2022-08-08 MED ORDER — BREZTRI AEROSPHERE 160-9-4.8 MCG/ACT IN AERO: 2.0000 | INHALATION_SPRAY | Freq: Two times a day (BID) | RESPIRATORY_TRACT | 5 refills | Status: DC

## 2022-08-08 MED ORDER — METHYLPREDNISOLONE ACETATE 80 MG/ML IJ SUSP
80.0000 mg | Freq: Once | INTRAMUSCULAR | Status: AC
  Administered 2022-08-08: 80 mg via INTRAMUSCULAR

## 2022-08-08 NOTE — Assessment & Plan Note (Signed)
Continue lifestyle and dietary modifications. Continue Prilosec '40mg'$  once a day. Nothing to eat or drink for 30 minutes afterwards.

## 2022-08-08 NOTE — Progress Notes (Signed)
Follow Up Note  RE: Jared [name] MRN: [id number] DOB: [birth year] Date of Office Visit: 08/08/2022  Referring provider: Burnard Bunting, MD Primary care provider: Burnard Bunting, MD  Chief Complaint: Cough (Constant dry cough for a couple of weeks ) and Asthma (Wheezing uses albuterol inhaler 2 times per day )  History of Present Illness: I had the pleasure of seeing Jared [name] for a follow up visit at the [facility] on 08/08/2022. He is a 71 y.o. male, who is being followed for asthma on Xolair, allergic rhinitis and LPRD. His previous allergy office visit was on 04/19/2022 with Dr. Neldon Mc. Today is a new complaint visit of issues with his breathing .  Severe persistent asthma Patient started with a dry cough 2-3 weeks ago. No phlegm. No fevers/chills. Wheezing at night. He can't sleep.  Patient ran out of Judithann Sauger a few days ago. It was helping more than the Flovent. Using albuterol a few times a day with some benefit.  Did not use nebulizer machine.  Denies any ER/urgent care visits or prednisone use since the last visit.  Taking Xolair every 4 weeks.   Denies cardiac issues.   Seasonal and perennial allergic rhinitis Taking Singulair daily.   LPRD (laryngopharyngeal reflux disease) Taking Prilosec '40mg'$  daily.    Assessment and Plan: Jared [name] is a 71 y.o. male with: Severe persistent asthma with acute exacerbation Coughing/wheezing for 2-3 weeks. Denies fever/chills/sick contacts. Ran out of Breo a few days ago. 2 steroids already in 2023.  Today's spirometry was normal. Most likely flared due to respiratory infection. Depot injections '80mg'$  given today. Start prednisone taper. Prednisone '10mg'$  tablets - take 2 tablets for 4 days then 1 tablet on day 5.  May take tessalon perles as needed for coughing, Get CXR. Daily controller medication(s): Restart Breztri 2 puffs twice a day with spacer and rinse mouth afterwards. Continue Xolair  injections for now - consider switching to a different biologics as this is his 3rd asthma flare this year.  Prior to physical activity: May use albuterol rescue inhaler 2 puffs 5 to 15 minutes prior to strenuous physical activities. Rescue medications: May use albuterol rescue inhaler 2 puffs or nebulizer every 4 to 6 hours as needed for shortness of breath, chest tightness, coughing, and wheezing. Monitor frequency of use.  During upper respiratory infections/flares:  Pretreat with albuterol 2 puffs or albuterol nebulizer.  If you need to use your albuterol nebulizer machine back to back within 15-30 minutes with no relief then please go to the ER/urgent care for further evaluation.  May use albuterol rescue inhaler 2 puffs or nebulizer every 4 to 6 hours as needed for shortness of breath, chest tightness, coughing, and wheezing. May use albuterol rescue inhaler 2 puffs 5 to 15 minutes prior to strenuous physical activities. Monitor frequency of use.   Seasonal and perennial allergic rhinitis Past history - 2004 testing positive to ragweed, trees, mold and dust mites. Interim history - controlled. Continue environmental control measures.  Continue Singulair '10mg'$  daily. May use over the counter antihistamines such as Zyrtec (cetirizine), Claritin (loratadine), Allegra (fexofenadine), or Xyzal (levocetirizine) daily as needed. May use Flonase (fluticasone) nasal spray 1 spray per nostril twice a day as needed for nasal congestion.   LPRD (laryngopharyngeal reflux disease) Continue lifestyle and dietary modifications. Continue Prilosec '40mg'$  once a day. Nothing to eat or drink for 30 minutes afterwards.  Return in about 4 weeks (around 09/05/2022).  Meds ordered this encounter  Medications  albuterol (VENTOLIN HFA) 108 (90 Base) MCG/ACT inhaler    Sig: Inhale 2 puffs into the lungs every 4 (four) hours as needed for wheezing or shortness of breath. Can inhale two puffs every four to six hours  as needed for cough, wheeze, shortness of breath, or chest tightness.    Dispense:  18 g    Refill:  1   Budeson-Glycopyrrol-Formoterol (BREZTRI AEROSPHERE) 160-9-4.8 MCG/ACT AERO    Sig: Inhale 2 puffs into the lungs in the morning and at bedtime. with spacer and rinse mouth afterwards.    Dispense:  11 g    Refill:  5   predniSONE (DELTASONE) 10 MG tablet    Sig: Prednisone '10mg'$  tablets - take 2 tablets for 4 days then 1 tablet on day 5.    Dispense:  9 tablet    Refill:  0   benzonatate (TESSALON PERLES) 100 MG capsule    Sig: Take 1 capsule (100 mg total) by mouth 3 (three) times daily as needed for cough.    Dispense:  20 capsule    Refill:  0   methylPREDNISolone acetate (DEPO-MEDROL) injection 80 mg   Lab Orders  No laboratory test(s) ordered today    Diagnostics: Spirometry:  Tracings reviewed. His effort: Good reproducible efforts. FVC: 2.88L FEV1: 2.18L, 87% predicted FEV1/FVC ratio: 76% Interpretation: Spirometry consistent with normal pattern.  Please see scanned spirometry results for details.  Medication List:  Current Outpatient Medications  Medication Sig Dispense Refill   acetaminophen (TYLENOL) 500 MG tablet Take 1,500 mg by mouth every 6 (six) hours as needed for mild pain.     albuterol (PROVENTIL) (2.5 MG/3ML) 0.083% nebulizer solution Take 3 mLs (2.5 mg total) by nebulization every 6 (six) hours as needed for wheezing or shortness of breath. 75 mL 12   amLODipine (NORVASC) 10 MG tablet Take 10 mg by mouth daily.     ARIPiprazole (ABILIFY) 5 MG tablet Take 5 mg by mouth every morning.     aspirin 325 MG EC tablet Take 325 mg by mouth daily.     benzonatate (TESSALON PERLES) 100 MG capsule Take 1 capsule (100 mg total) by mouth 3 (three) times daily as needed for cough. 20 capsule 0   betamethasone dipropionate 0.05 % cream Apply 1 application  topically daily as needed (dry skin).     cefadroxil (DURICEF) 500 MG capsule Take 1 capsule (500 mg total) by  mouth 2 (two) times daily. 12 capsule 0   DULoxetine (CYMBALTA) 60 MG capsule Take 60 mg by mouth every morning.     ergocalciferol (VITAMIN D2) 1.25 MG (50000 UT) capsule Take 50,000 Units by mouth once a week. Saturday     esomeprazole (NEXIUM) 40 MG capsule Take 1 capsule (40 mg total) by mouth 2 (two) times daily. 60 capsule 5   Evolocumab (REPATHA SURECLICK) 572 MG/ML SOAJ Inject 1 Pen into the skin every 14 (fourteen) days. 2 mL 11   ferrous sulfate 325 (65 FE) MG tablet Take 1 tablet (325 mg total) by mouth daily with breakfast.  3   fluticasone (FLOVENT HFA) 110 MCG/ACT inhaler 2 puffs twice a day with a spacer to prevent cough or wheeze. Rinse mouth after use. 1 each 5   furosemide (LASIX) 20 MG tablet Take 20 mg by mouth daily as needed for edema.     hyoscyamine (LEVSIN) 0.125 MG tablet Take 1 tablet (0.125 mg total) by mouth every 6 (six) hours as needed for cramping. 30 tablet 0  ipratropium-albuterol (DUONEB) 0.5-2.5 (3) MG/3ML SOLN Take 3 mLs by nebulization every 6 (six) hours as needed (wheezing, coughing, shortness of breath). 360 mL 1   isosorbide mononitrate (IMDUR) 30 MG 24 hr tablet take 1 tablet by mouth once daily (Patient taking differently: at bedtime.) 15 tablet 0   ketoconazole (NIZORAL) 2 % cream Apply topically daily.     levothyroxine (SYNTHROID) 25 MCG tablet Take 25 mcg by mouth every morning.     levothyroxine (SYNTHROID) 50 MCG tablet Take 50 mcg by mouth daily before breakfast.     metroNIDAZOLE (METROCREAM) 0.75 % cream Apply topically daily.     montelukast (SINGULAIR) 10 MG tablet Take 1 tablet (10 mg total) by mouth at bedtime. 30 tablet 5   Multiple Vitamin (MULTIVITAMIN) tablet Take 1 tablet by mouth at bedtime.      omalizumab [name]) 150 MG/ML prefilled syringe Inject 150 mg into the skin every 28 (twenty-eight) days. 1 mL 11   ondansetron (ZOFRAN) 4 MG tablet Take 4 mg by mouth daily as needed for nausea/vomiting.     oxyCODONE (OXY IR/ROXICODONE) 5  MG immediate release tablet Take 1 tablet (5 mg total) by mouth every 4 (four) hours as needed for moderate pain. 10 tablet 0   potassium chloride (KLOR-CON) 10 MEQ tablet Take 10 mEq by mouth daily as needed (low levels).     predniSONE (DELTASONE) 10 MG tablet Prednisone '10mg'$  tablets - take 2 tablets for 4 days then 1 tablet on day 5. 9 tablet 0   Spacer/Aero-Holding Chambers (OPTICHAMBER DIAMOND) MISC 1 each by Other route daily. Use as directed with inhaler. 1 each 1   telmisartan (MICARDIS) 20 MG tablet Take 20 mg by mouth daily.     testosterone cypionate (DEPOTESTOSTERONE CYPIONATE) 200 MG/ML injection Inject 200 mg into the muscle every 30 (thirty) days.     traZODone (DESYREL) 150 MG tablet Take 150 mg by mouth at bedtime.     traZODone (DESYREL) 50 MG tablet Take 1 tablet (50 mg total) by mouth at bedtime. 90 tablet 3   vitamin C (ASCORBIC ACID) 500 MG tablet Take 500 mg by mouth 2 (two) times daily.      albuterol (VENTOLIN HFA) 108 (90 Base) MCG/ACT inhaler Inhale 2 puffs into the lungs every 4 (four) hours as needed for wheezing or shortness of breath. Can inhale two puffs every four to six hours as needed for cough, wheeze, shortness of breath, or chest tightness. 18 g 1   Budeson-Glycopyrrol-Formoterol (BREZTRI AEROSPHERE) 160-9-4.8 MCG/ACT AERO Inhale 2 puffs into the lungs in the morning and at bedtime. with spacer and rinse mouth afterwards. 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    Sulfonamide Derivatives Hives and Itching   Pholcodine    Codeine Nausea Only   Statins Other (See Comments)    Myalgias   I reviewed his past medical history, social history, family history, and environmental history and no significant changes have been reported from his previous visit.  Review of Systems  Constitutional:  Negative for appetite change, chills, fever and unexpected weight change.  HENT:  Negative for congestion and rhinorrhea.   Eyes:  Negative for itching.  Respiratory:  Positive for cough and wheezing. Negative for chest tightness and shortness of breath.   Gastrointestinal:  Negative for abdominal pain.  Skin:  Negative for rash.  Allergic/Immunologic: Positive for environmental allergies.  Neurological:  Negative for headaches.    Objective: BP 130/86   Pulse 77   Temp 97.7 F (36.5 C)   Resp 14   Ht 5' 5.5" (1.664 m)   Wt 201 lb 9.6 oz (91.4 kg)   SpO2 95%   BMI 33.04 kg/m  Body mass index is 33.04 kg/m. Physical Exam Vitals and nursing note reviewed.  Constitutional:      Appearance: Normal appearance. He is well-developed.  HENT:     Head: Normocephalic and atraumatic.     Right Ear: Tympanic membrane and external ear normal.     Left Ear: Tympanic membrane and external ear normal.     Nose: Nose normal.     Mouth/Throat:     Mouth: Mucous membranes are moist.      Pharynx: Oropharynx is clear.  Eyes:     Conjunctiva/sclera: Conjunctivae normal.  Cardiovascular:     Rate and Rhythm: Normal rate and regular rhythm.     Heart sounds: Normal heart sounds. No murmur heard. Pulmonary:     Effort: Pulmonary effort is normal.     Breath sounds: Wheezing and rhonchi present. No rales.  Musculoskeletal:     Cervical back: Neck supple.  Skin:    General: Skin is warm.     Findings: No rash.  Neurological:     Mental Status: He is alert and oriented to person, place, and time.  Psychiatric:        Behavior: Behavior normal.    Previous notes and tests were reviewed. The plan was reviewed with the patient/family, and all questions/concerned were addressed.  It was my pleasure to see Jared [name] today and participate in his care. Please feel free to contact me with any questions or concerns.  Sincerely,  Rexene Alberts, DO Allergy & Immunology  [facility] office: [place] office: [phone]

## 2022-08-08 NOTE — Patient Instructions (Addendum)
Asthma Depot injections '80mg'$  given today. Start prednisone taper. Prednisone '10mg'$  tablets - take 2 tablets for 4 days then 1 tablet on day 5.  May take tessalon perles as needed for coughing, Get CXR.  Daily controller medication(s): Restart Breztri 2 puffs twice a day with spacer and rinse mouth afterwards. Continue Xolair injections. Will discuss switching to another biologics at the next visit.  Prior to physical activity: May use albuterol rescue inhaler 2 puffs 5 to 15 minutes prior to strenuous physical activities. Rescue medications: May use albuterol rescue inhaler 2 puffs or nebulizer every 4 to 6 hours as needed for shortness of breath, chest tightness, coughing, and wheezing. Monitor frequency of use.  During upper respiratory infections/flares:  Pretreat with albuterol 2 puffs or albuterol nebulizer.  If you need to use your albuterol nebulizer machine back to back within 15-30 minutes with no relief then please go to the ER/urgent care for further evaluation.  May use albuterol rescue inhaler 2 puffs or nebulizer every 4 to 6 hours as needed for shortness of breath, chest tightness, coughing, and wheezing. May use albuterol rescue inhaler 2 puffs 5 to 15 minutes prior to strenuous physical activities. Monitor frequency of use.  Asthma control goals:  Full participation in all desired activities (may need albuterol before activity) Albuterol use two times or less a week on average (not counting use with activity) Cough interfering with sleep two times or less a month Oral steroids no more than once a year No hospitalizations   Allergic rhinitis Continue environmental control measures.  Continue Singulair '10mg'$  daily. May use over the counter antihistamines such as Zyrtec (cetirizine), Claritin (loratadine), Allegra (fexofenadine), or Xyzal (levocetirizine) daily as needed. May use Flonase (fluticasone) nasal spray 1 spray per nostril twice a day as needed for nasal congestion.    Heartburn: Continue lifestyle and dietary modifications. Continue Prilosec '40mg'$  once a day. Nothing to eat or drink for 30 minutes afterwards.  Follow up in 1 months or sooner if needed.

## 2022-08-08 NOTE — Assessment & Plan Note (Signed)
Coughing/wheezing for 2-3 weeks. Denies fever/chills/sick contacts. Ran out of Breo a few days ago. 2 steroids already in 2023.  Today's spirometry was normal. Most likely flared due to respiratory infection. Depot injections '80mg'$  given today. Start prednisone taper. Prednisone '10mg'$  tablets - take 2 tablets for 4 days then 1 tablet on day 5.  May take tessalon perles as needed for coughing, Get CXR. Daily controller medication(s): Restart Breztri 2 puffs twice a day with spacer and rinse mouth afterwards. Continue Xolair injections for now - consider switching to a different biologics as this is his 3rd asthma flare this year.  Prior to physical activity: May use albuterol rescue inhaler 2 puffs 5 to 15 minutes prior to strenuous physical activities. Rescue medications: May use albuterol rescue inhaler 2 puffs or nebulizer every 4 to 6 hours as needed for shortness of breath, chest tightness, coughing, and wheezing. Monitor frequency of use.  During upper respiratory infections/flares:  Pretreat with albuterol 2 puffs or albuterol nebulizer.  If you need to use your albuterol nebulizer machine back to back within 15-30 minutes with no relief then please go to the ER/urgent care for further evaluation.  May use albuterol rescue inhaler 2 puffs or nebulizer every 4 to 6 hours as needed for shortness of breath, chest tightness, coughing, and wheezing. May use albuterol rescue inhaler 2 puffs 5 to 15 minutes prior to strenuous physical activities. Monitor frequency of use.

## 2022-08-08 NOTE — Telephone Encounter (Signed)
Called pt back. Relayed [name] Lomax,NP message. Pt agreeable to referral to psychology for CBT. Aware someone should reach out in the next couple weeks to make appt with him. If not, he will call us. He would like our referral coordinator to call him once referral sent to get contact info.

## 2022-08-08 NOTE — Assessment & Plan Note (Signed)
Past history - 2004 testing positive to ragweed, trees, mold and dust mites. Interim history - controlled. Continue environmental control measures.  Continue Singulair '10mg'$  daily. May use over the counter antihistamines such as Zyrtec (cetirizine), Claritin (loratadine), Allegra (fexofenadine), or Xyzal (levocetirizine) daily as needed. May use Flonase (fluticasone) nasal spray 1 spray per nostril twice a day as needed for nasal congestion.

## 2022-08-09 ENCOUNTER — Telehealth: Payer: Self-pay | Admitting: Family Medicine

## 2022-08-09 NOTE — Telephone Encounter (Signed)
Referral sent to [facility], phone # [phone].

## 2022-08-10 NOTE — Progress Notes (Signed)
Please call patient.  Chest X-ray did not show any acute process.  No need for antibiotics unless he is having fevers, discolored mucous. Is he feeling better?

## 2022-08-16 ENCOUNTER — Other Ambulatory Visit: Payer: Self-pay

## 2022-08-18 ENCOUNTER — Ambulatory Visit: Payer: Medicare PPO

## 2022-08-19 ENCOUNTER — Ambulatory Visit: Payer: Medicare PPO

## 2022-08-22 ENCOUNTER — Ambulatory Visit (HOSPITAL_COMMUNITY)
Admission: RE | Admit: 2022-08-22 | Discharge: 2022-08-22 | Disposition: A | Discharge status: Home / Self Care | Payer: Medicare PPO | Source: Ambulatory Visit | Attending: Cardiology | Admitting: Cardiology

## 2022-08-22 DIAGNOSIS — I6523 Occlusion and stenosis of bilateral carotid arteries: Secondary | ICD-10-CM | POA: Diagnosis not present

## 2022-08-22 DIAGNOSIS — C61 Malignant neoplasm of prostate: Secondary | ICD-10-CM | POA: Diagnosis not present

## 2022-08-23 ENCOUNTER — Ambulatory Visit: Payer: Medicare PPO

## 2022-08-23 ENCOUNTER — Encounter: Payer: Self-pay | Admitting: Internal Medicine

## 2022-08-23 ENCOUNTER — Ambulatory Visit: Payer: Medicare PPO | Admitting: Internal Medicine

## 2022-08-23 ENCOUNTER — Other Ambulatory Visit: Payer: Self-pay

## 2022-08-23 ENCOUNTER — Telehealth: Payer: Self-pay | Admitting: *Deleted

## 2022-08-23 VITALS — BP 126/72 | HR 75 | Temp 97.7°F | Resp 14

## 2022-08-23 DIAGNOSIS — J4550 Severe persistent asthma, uncomplicated: Secondary | ICD-10-CM | POA: Diagnosis not present

## 2022-08-23 DIAGNOSIS — K219 Gastro-esophageal reflux disease without esophagitis: Secondary | ICD-10-CM | POA: Diagnosis not present

## 2022-08-23 DIAGNOSIS — J3089 Other allergic rhinitis: Secondary | ICD-10-CM | POA: Diagnosis not present

## 2022-08-23 DIAGNOSIS — J302 Other seasonal allergic rhinitis: Secondary | ICD-10-CM

## 2022-08-23 MED ORDER — MONTELUKAST SODIUM 10 MG PO TABS: 10.0000 mg | ORAL_TABLET | Freq: Every day | ORAL | 1 refills | Status: DC

## 2022-08-23 MED ORDER — BUDESONIDE 0.25 MG/2ML IN SUSP: RESPIRATORY_TRACT | 5 refills | Status: DC

## 2022-08-23 NOTE — Progress Notes (Signed)
FOLLOW UP Date of Service/Encounter:  08/23/22   Subjective:  Jared [name] (DOB: [birth year]) is a 71 y.o. male who returns to the [facility] on 08/23/2022 for follow up for severe asthma, allergic rhinitis and LPR.  History obtained from: chart review and patient.  At last visit, he was seen with Dr. [name] 08/08/2022 for acute exacerbation of severe persistent asthma requiring oral prednisone.  This year so far, he has had 3 exacerbations requiring oral prednisone. In 2022, recalls maybe 1-2 exacerbations requiring oral prednisone also.   Since that visit, he reports doing better.  His coughing is better and still with some wheezing.  He is taking Breztri 2 puffs BID and Singulair daily.  He is compliant with Xolair '150mg'$  every 4 weeks; he does not feel like the Xolair is working as well as it used to compared to when he first started it as he is having to use his albuterol a lot more frequently due to coughing/trouble breathing and wheezing.  Using albuterol almost daily or every other day.  Denies any reflux symptoms, taking prilosec or nexium daily.  Denies any upper airway symptoms with congestion/rhinorrhea/sneezing.  .    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 Physical Exam: GEN: alert, well developed HEENT: clear conjunctiva, TM grey and translucent, nose with moderate inferior turbinate hypertrophy, pink nasal mucosa, no rhinorrhea, no cobblestoning HEART: regular rate and rhythm, no murmur LUNGS: clear to auscultation bilaterally, no coughing, unlabored respiration SKIN: no rashes or lesions  Spirometry:  Tracings reviewed. His effort: Variable effort-results affected. FVC: 2.57L FEV1: 1.96L, 74% predicted FEV1/FVC ratio: 76% Interpretation: Spirometry consistent with possible restrictive disease. No obstruction noted. Small airway FEF25-75 also normal.   Please see scanned spirometry results for details.  Assessment/Plan  Severe Persistent Asthma Still some residual coughing/wheezing but normal lung exam and spirometry without obstruction.  Will try addition of inhaled budesonide to see if it helps.  Will also work on switching biologic therapy due to frequent exacerbations on Xolair requiring oral prednisone.  [place] 06/2022 was 500.   Daily controller medication(s): Continue Breztri 160-9-4.20mg 2 puffs twice a day with spacer and rinse mouth afterwards. We will [facility] to [facility]  [facility].  Will discuss with Tammy to start the PA process.  Given Xolair today.  Will plan for next shot 09/20/2022 to be Berna Bue '30mg'$  ever 4 weeks x3 and then every 8 weeks.   During upper respiratory infections/flares:  Now and with any flare ups/infections: start Budesonide nebulizer 0.'25mg'$  twice daily for 1-2 weeks.  If you need to use your albuterol nebulizer machine back to back within 15-30 minutes with no  relief then please go to the ER/urgent care for further evaluation.  Rescue medications: May use albuterol rescue inhaler 2 puffs or nebulizer every 4 to 6 hours as needed for shortness of breath, chest tightness, coughing, and wheezing. Monitor frequency of use.  Asthma control goals:  Full participation in all desired activities (may need albuterol before activity) Albuterol use two times or less a week on average (not counting use with activity) Cough interfering with sleep two times or less a month Oral steroids no more than once a year No hospitalizations   Allergic rhinitis Continue environmental control measures.  Continue Singulair '10mg'$  daily. May use over the counter antihistamines such as Zyrtec (cetirizine), Claritin (loratadine), Allegra (fexofenadine), or Xyzal (levocetirizine) daily as needed. May use Flonase (fluticasone) nasal spray 1 spray per nostril twice a day as needed for nasal congestion.   Reflux:  Continue lifestyle and dietary modifications. Continue Prilosec '40mg'$  once a day. Nothing to eat or drink for 30 minutes afterwards.  Keep follow up with Dr. Neldon Mc in February.   Harlon Flor, MD  [facility]

## 2022-08-23 NOTE — Telephone Encounter (Signed)
-----   Message from Larose Kells, MD sent at 08/23/2022 12:00 PM EST ----- [place], Mr. Westrup has been having frequent exacerbations on Xolair, 3 this year requiring oral prednisone.  Saw him for a follow up today and wanted to switch him to [place] ([place] 500 on CBC last month).  Thank you. His next Xolair is due 1/2 and if it is approved then would like to do [place] instead of Xolair.

## 2022-08-23 NOTE — Patient Instructions (Addendum)
Asthma Daily controller medication(s): Continue Breztri 160-9-4.41mg 2 puffs twice a day with spacer and rinse mouth afterwards. We will try to change your biologic [facility].  Will discuss with Tammy to start the PA process.   During upper respiratory infections/flares:  Now and with any flare ups/infections: start Budesonide nebulizer 0.'25mg'$  twice daily for 1-2 weeks.  If you need to use your albuterol nebulizer machine back to back within 15-30 minutes with no relief then please go to the ER/urgent care for further evaluation.  Rescue medications: May use albuterol rescue inhaler 2 puffs or nebulizer every 4 to 6 hours as needed for shortness of breath, chest tightness, coughing, and wheezing. Monitor frequency of use.  Asthma control goals:  Full participation in all desired activities (may need albuterol before activity) Albuterol use two times or less a week on average (not counting use with activity) Cough interfering with sleep two times or less a month Oral steroids no more than once a year No hospitalizations   Allergic rhinitis Continue environmental control measures.  Continue Singulair '10mg'$  daily. May use over the counter antihistamines such as Zyrtec (cetirizine), Claritin (loratadine), Allegra (fexofenadine), or Xyzal (levocetirizine) daily as needed. May use Flonase (fluticasone) nasal spray 1 spray per nostril twice a day as needed for nasal congestion.   Reflux:  Continue lifestyle and dietary modifications. Continue Prilosec '40mg'$  once a day. Nothing to eat or drink for 30 minutes afterwards.  Keep follow up with Dr. KNeldon Mcin February.

## 2022-08-23 NOTE — Telephone Encounter (Signed)
Submitted patient online for Berna Bue and due to recent income changes patient does not qualify. Do you want to submit for Nucala or Dupixent?

## 2022-08-24 ENCOUNTER — Telehealth: Payer: Self-pay | Admitting: Gastroenterology

## 2022-08-24 DIAGNOSIS — F3181 Bipolar II disorder: Secondary | ICD-10-CM | POA: Diagnosis not present

## 2022-08-24 DIAGNOSIS — D509 Iron deficiency anemia, unspecified: Secondary | ICD-10-CM

## 2022-08-24 DIAGNOSIS — R195 Other fecal abnormalities: Secondary | ICD-10-CM

## 2022-08-24 NOTE — Telephone Encounter (Signed)
Returned call to patient to schedule capsule endoscopy. Pt has been scheduled for Tuesday, 09/06/22 at 8:30 am. Pt is aware that he will have to return to the office the same day of his appt at 4 pm to return equipment. Pt is aware that he will need to prep prior to this appt. Pt does not have MyChart so he will stop by the 2nd floor receptionist desk to pick up the instructions today. Pt had no concerns at the end of the call.  Ambulatory referral to GI epic.

## 2022-08-24 NOTE — Telephone Encounter (Signed)
Patient called and advised change to Nucala and will mail consent form to patient to fill out and return to me

## 2022-08-24 NOTE — Telephone Encounter (Signed)
Inbound call from patient stating he is ready to schedule for procedure with camera? Please advise.

## 2022-08-24 NOTE — Telephone Encounter (Signed)
L/m for patient to contact me to discuss PAP for Nucala

## 2022-08-29 ENCOUNTER — Other Ambulatory Visit: Payer: Self-pay | Admitting: Neurology

## 2022-08-29 DIAGNOSIS — R350 Frequency of micturition: Secondary | ICD-10-CM | POA: Diagnosis not present

## 2022-08-29 DIAGNOSIS — N35011 Post-traumatic bulbous urethral stricture: Secondary | ICD-10-CM | POA: Diagnosis not present

## 2022-08-30 DIAGNOSIS — E291 Testicular hypofunction: Secondary | ICD-10-CM | POA: Diagnosis not present

## 2022-08-30 DIAGNOSIS — F3181 Bipolar II disorder: Secondary | ICD-10-CM | POA: Diagnosis not present

## 2022-08-31 ENCOUNTER — Other Ambulatory Visit (INDEPENDENT_AMBULATORY_CARE_PROVIDER_SITE_OTHER): Payer: Medicare PPO

## 2022-08-31 ENCOUNTER — Telehealth: Payer: Self-pay

## 2022-08-31 DIAGNOSIS — D509 Iron deficiency anemia, unspecified: Secondary | ICD-10-CM | POA: Diagnosis not present

## 2022-08-31 LAB — CBC WITH DIFFERENTIAL/PLATELET
Basophils Absolute: 0.1 10*3/uL (ref 0.0–0.1)
Basophils Relative: 0.9 % (ref 0.0–3.0)
Eosinophils Absolute: 0.4 10*3/uL (ref 0.0–0.7)
Eosinophils Relative: 4.2 % (ref 0.0–5.0)
HCT: 42.9 % (ref 39.0–52.0)
Hemoglobin: 14.1 g/dL (ref 13.0–17.0)
Lymphocytes Relative: 18.8 % (ref 12.0–46.0)
Lymphs Abs: 1.8 10*3/uL (ref 0.7–4.0)
MCHC: 33 g/dL (ref 30.0–36.0)
MCV: 81 fl (ref 78.0–100.0)
Monocytes Absolute: 0.8 10*3/uL (ref 0.1–1.0)
Monocytes Relative: 8.5 % (ref 3.0–12.0)
Neutro Abs: 6.3 10*3/uL (ref 1.4–7.7)
Neutrophils Relative %: 67.6 % (ref 43.0–77.0)
Platelets: 286 10*3/uL (ref 150.0–400.0)
RBC: 5.3 Mil/uL (ref 4.22–5.81)
RDW: 19.2 % — ABNORMAL HIGH (ref 11.5–15.5)
WBC: 9.4 10*3/uL (ref 4.0–10.5)

## 2022-08-31 LAB — IBC + FERRITIN
Ferritin: 46.3 ng/mL (ref 22.0–322.0)
Iron: 34 ug/dL — ABNORMAL LOW (ref 42–165)
Saturation Ratios: 10.3 % — ABNORMAL LOW (ref 20.0–50.0)
TIBC: 329 ug/dL (ref 250.0–450.0)
Transferrin: 235 mg/dL (ref 212.0–360.0)

## 2022-08-31 NOTE — Telephone Encounter (Signed)
-----   Message from Roetta Sessions, [place] sent at 07/13/2022  8:57 AM EDT ----- Regarding: labs due in Dec Patient due for cbc and tibc/ferritin in Dec (one day next week)

## 2022-08-31 NOTE — Telephone Encounter (Signed)
Called and spoke to patient. He will go to the lab one day this week or next

## 2022-09-06 ENCOUNTER — Ambulatory Visit (INDEPENDENT_AMBULATORY_CARE_PROVIDER_SITE_OTHER): Payer: Medicare PPO | Admitting: Gastroenterology

## 2022-09-06 ENCOUNTER — Ambulatory Visit: Payer: Medicare PPO | Admitting: Allergy and Immunology

## 2022-09-06 DIAGNOSIS — D509 Iron deficiency anemia, unspecified: Secondary | ICD-10-CM

## 2022-09-06 NOTE — Progress Notes (Signed)
SN: [id number] Exp: 2023-12-21 LOT: [id number] Patient arrived for Capsule Endoscopy. Reported the prep went well. This nurse explained dietary restrictions for the next few hours. Patient verbalized understanding. Opened capsule, ensured capsule was flashing prior to the patient swallowing the capsule. Patient swallowed capsule without difficulty. Patient instructed to return to the office at 4:00 pm today for removal of the recording equipment, to call the office with any questions and if no capsule was visualized after 72 hours. No further questions by the conclusion of the visit.

## 2022-09-06 NOTE — Patient Instructions (Signed)
POST CAPSULE INSTRUCTIONS:  Contact our office immediately at [phone] if you suffer from any abdominal pain, nausea, or vomiting during capsule endoscopy. Do not eat or drink for at least 2 hours. After 2 hours you may have any of the following to drink: Water   White grape juice 7-Up   Chicken Bouillon Sprite   Ginger Ale After 4 hours you may have a light snack to include any of the following: A cup of soup   sandwich Bowl of cereal  Rice Toast   Eggs 2-3 small cookies (i.e. vanilla wafers or graham crackers) After 8 hours you may return to your regular diet. During your procedure do not go near anyone else that is having capsule endoscopy. Do not be in close contact with an MRI machine or a radio or television tower. Do not wear a heavy coat or sweater because your recorder may over heat and stop recording.   Do not disconnect the equipment or remove the belt at any time.  Since the Data Recorder is actually a small computer, it should be treated with utmost care and protection.  Avoid sudden movement and banging of the Data Recorder.  Do not do any heavy lifting or strenuous physical activity during the test especially if it involves sweating and do not bend over or stoop during capsule endoscopy. During capsule endoscopy, you will need to verify every 15 minutes that the small light on top of the Data Recorder is blinking twice per second.  If for some reason it stops blinking at this site, record the time and contact our office at [phone].  

## 2022-09-07 ENCOUNTER — Telehealth: Payer: Self-pay | Admitting: Gastroenterology

## 2022-09-07 NOTE — Telephone Encounter (Addendum)
LM for patient to call back to review results of capsule endo which was normal. Also scheduled him for F/U appointment on Wednesday, 10-26-22 at 9:00 am. Asked him to reschedule this appointment if not convenient.

## 2022-09-07 NOTE — Telephone Encounter (Signed)
Capsule endoscopy interpreted.  Essentially this was a normal study with adequate prep, no concerning pathology noted to cause iron deficiency anemia.   Previously EGD was negative for cause of iron deficiency.  His colonoscopy was 1 year ago and showed a few small polyps with significant diverticulosis.  CT scan done over the summer showed diverticulitis, has had numerous episodes of diverticulitis thus far.   Recommend continuing iron supplementation, his anemia resolved.  I would like to see him back in the office to discuss further plans, if he wants to monitor on iron for now or consider a repeat colonoscopy.  I think yield of repeat colonoscopy is likely low however we do not have a clear source for his anemia.   Jan can you please let him know, and can you help book him a follow up with me, follow-up in the next 1 to 2 months in the office for reassessment?  Thanks

## 2022-09-08 NOTE — Telephone Encounter (Signed)
Patient returned call. We reviewed his capsule endoscopy results and recommendations. Pt will continue iron supplement. Pt is aware of his follow up appt, he does not need this mailed to him. Pt verbalized understanding of all information and had no concerns at the end of the call.

## 2022-09-09 ENCOUNTER — Encounter: Payer: Self-pay | Admitting: Gastroenterology

## 2022-09-14 DIAGNOSIS — F329 Major depressive disorder, single episode, unspecified: Secondary | ICD-10-CM | POA: Diagnosis not present

## 2022-09-14 DIAGNOSIS — R531 Weakness: Secondary | ICD-10-CM | POA: Diagnosis not present

## 2022-09-14 DIAGNOSIS — E039 Hypothyroidism, unspecified: Secondary | ICD-10-CM | POA: Diagnosis not present

## 2022-09-14 DIAGNOSIS — E785 Hyperlipidemia, unspecified: Secondary | ICD-10-CM | POA: Diagnosis not present

## 2022-09-14 DIAGNOSIS — E291 Testicular hypofunction: Secondary | ICD-10-CM | POA: Diagnosis not present

## 2022-09-14 DIAGNOSIS — E611 Iron deficiency: Secondary | ICD-10-CM | POA: Diagnosis not present

## 2022-09-14 DIAGNOSIS — R259 Unspecified abnormal involuntary movements: Secondary | ICD-10-CM | POA: Diagnosis not present

## 2022-09-14 DIAGNOSIS — R269 Unspecified abnormalities of gait and mobility: Secondary | ICD-10-CM | POA: Diagnosis not present

## 2022-09-14 DIAGNOSIS — I10 Essential (primary) hypertension: Secondary | ICD-10-CM | POA: Diagnosis not present

## 2022-09-14 DIAGNOSIS — G4730 Sleep apnea, unspecified: Secondary | ICD-10-CM | POA: Diagnosis not present

## 2022-09-20 ENCOUNTER — Ambulatory Visit: Payer: Medicare PPO

## 2022-09-21 DIAGNOSIS — F3181 Bipolar II disorder: Secondary | ICD-10-CM | POA: Diagnosis not present

## 2022-10-04 ENCOUNTER — Ambulatory Visit: Payer: Medicare PPO | Admitting: Allergy and Immunology

## 2022-10-04 NOTE — Patient Instructions (Addendum)
Below is our plan:  We will continue to monitor. Make sure to use your CPAP nightly.   Please make sure you are staying well hydrated. I recommend 50-60 ounces daily. Well balanced diet and regular exercise encouraged. Consistent sleep schedule with 6-8 hours recommended.   Please continue follow up with care team as directed.   Follow up with Dr Brett Fairy in 4-6 months   You may receive a survey regarding today's visit. I encourage you to leave honest feed back as I do use this information to improve patient care. Thank you for seeing me today!   Management of Memory Problems   There are some general things you can do to help manage your memory problems.  Your memory may not in fact recover, but by using techniques and strategies you will be able to manage your memory difficulties better.   1)  Establish a routine. Try to establish and then stick to a regular routine.  By doing this, you will get used to what to expect and you will reduce the need to rely on your memory.  Also, try to do things at the same time of day, such as taking your medication or checking your calendar first thing in the morning. Think about think that you can do as a part of a regular routine and make a list.  Then enter them into a daily planner to remind you.  This will help you establish a routine.   2)  Organize your environment. Organize your environment so that it is uncluttered.  Decrease visual stimulation.  Place everyday items such as keys or cell phone in the same place every day (ie.  Basket next to front door) Use post it notes with a brief message to yourself (ie. Turn off light, lock the door) Use labels to indicate where things go (ie. Which cupboards are for food, dishes, etc.) Keep a notepad and pen by the telephone to take messages   3)  Memory Aids A diary or journal/notebook/daily planner Making a list (shopping list, chore list, to do list that needs to be done) Using an alarm as a reminder  (kitchen timer or cell phone alarm) Using cell phone to store information (Notes, Calendar, Reminders) Calendar/White board placed in a prominent position Post-it notes   In order for memory aids to be useful, you need to have good habits.  It's no good remembering to make a note in your journal if you don't remember to look in it.  Try setting aside a certain time of day to look in journal.   4)  Improving mood and managing fatigue. There may be other factors that contribute to memory difficulties.  Factors, such as anxiety, depression and tiredness can affect memory. Regular gentle exercise can help improve your mood and give you more energy. Exercise: there are short videos created by the [employer] on Health specially for older adults: https://bit.ly/2I30q97.  Mediterranean diet: which emphasizes fruits, vegetables, whole grains, legumes, fish, and other seafood; unsaturated fats such as olive oils; and low amounts of red meat, eggs, and sweets. A variation of this, called MIND ([place] Intervention for Neurodegenerative Delay) incorporates the DASH (Dietary Approaches to Stop Hypertension) diet, which has been shown to lower high blood pressure, a risk factor for Alzheimer's disease. More information at: RepublicForum.gl.  Aerobic exercise that improve heart health is also good for the mind.  [employer] on Aging have short videos for exercises that you can do at home: GoldCloset.com.ee Simple  relaxation techniques may help relieve symptoms of anxiety Try to get back to completing activities or hobbies you enjoyed doing in the past. Learn to pace yourself through activities to decrease fatigue. Find out about some local support groups where you can share experiences with others. Try and achieve 7-8 hours of sleep at night.

## 2022-10-04 NOTE — Progress Notes (Signed)
PATIENT: Jared [name] DOB: 02-28-1951  REASON FOR VISIT: follow up HISTORY FROM: patient  Chief Complaint  Patient presents with   Room 1    Pt is here follow up tremors, reports no problems or concerns.    HISTORY OF PRESENT ILLNESS:  10/05/22 ALL:  Jared [name] returns for follow up. He was last seen 02/2022. He called 07/2022 reporting insomnia was worse and trazodone not effective. Referral placed for CBT. He did not go. He reports sleeping better. He is getting at least 7-8 hours nightly. He discontinued trazodone and has not taken recently. He continues to use CPAP but admits he has not used nightly. He was seen by Debbora Dus, psychiatry, about a month ago and reports she discontinued Abilify. He feels that mood is stable. He continues to note difficulty remembering details. He does not feel this has worsened. He continues to drive without difficulty and assists his wife with maintaining home.      02/17/2022 ALL: Jared [name] returns for follow up. He was last seen by me 04/2021 and reported concerns for worsening left sided resting tremor and imbalance. He had taken Abilify for several years and previously diagnosed with neuroleptic induced parkinsonism. He requested DaT scan due to worsening symptoms. He was seen by Dr Brett Fairy 10/13/2021. Normal DaT scan results reviewed. She advised continue follow up with psychiatry and compliance with CPAP. He called 12/2021 with worsening insomnia. Trazodone was increased to '150mg'$ . He called about a month later reporting daytime grogginess with increased dose and advised to reduce dose to 100-'125mg'$  daily. He reports that most recent, he has been taking '50mg'$  daily at bedtime and feels this dose if the best dose for him. He feels that he  He is followed closely by psychiatry. He continues Abilify. He feels mood is good. Memory is stable.   Compliance report dated 01/18/2022-02/16/2022 shows he used CPAP 25/30 nights for compliance of 83%. He used CPAP  greater than four hours 24/30 nights for compliance of 80%. AHI was 1.2 on 6-16cmH20. No significant leak. He admits that he did not take his machine when he went out of town for 5 nights but usually uses CPAP every night when home.   05/04/2021 ALL:  Jared [name] returns for follow up for tremor and OSA on CPAP. He is doing fairly well on CPAP therapy. He uses his machine most nights but admits that sometimes he forgets. He denies concerns with CPAP machine or supplies.   He feels that tremor is worse. He has noted left hand tremor at rest. He notices this when watching TV but endorses worsening with activity or anxiety. He also feels that he has had changes in how he walks. He feels that his feet move faster than his body. He feels the sensation that he is leaning forward. He has had one fall since last being seen and reports this being due to slipping down a hill while trying to pick tomatoes. He does not use assistive device. He does not usually feel off balance. He denies difficult eating or swallowing. No changes in memory. He continues to have a difficult time sleeping. He was restarted on trazodone but unsure if it is helping.   He continues to see psychiatry every 4-6 weeks. He reports not taking Abilify. He can't remember how long ago this was stopped. He reported taking it in 10/2020 but now feels that he has been off this medication for about a year.   He was scheduled for follow up  with Dr Brett Fairy to discuss this concern but returned to my schedule in her absence. He is taking duloxetine '60mg'$  daily. He does not think psychiatry has added any new medications since last being seen. He reports PCP has changed GERD medications but unsure what he is taking. He does not think he is on Nexium or Pepcid.     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  He is followed by Dr. Deirdre Evener, psychiatry. At last follow-up 1 year ago, he  reported headaches had resolved after stopping duloxetine.  He reports that duloxetine 30 mg was restarted last year for worsening depression. Headaches have not returned.   He called our office yesterday to schedule CPAP follow-up and to report concerns of difficulty staying asleep. He does not feel rested in the mornings and tends to lie in bed longer than he would like. He feels that depression is well managed. He usually goes to bed around the same time every night. He feels that he tosses and turns all night then does not feel motivated to get up the next day. He may lay in bed for 10-12 hours. He continues to act out dreams. He often wakes himself up thinking that he has poured medications in his hand to take but nothing is there. His wife continues to note that he talks and has random movements in his sleep. He reports that psychiatry asked him to discuss sleep concerns with [place]. He was on Belsomra '20mg'$ , written by psychiatry, at last visit but is uncertain why this was stopped. He reports hallucinations in the past with Ambien. Trazodone was ineffective. He has never taken clonazepam. 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   Compliance report dated 09/26/2020 through 10/25/2020 reveals that he used CPAP 29 of the past 30 days for compliance of 97%.  He is CPAP greater than 4 hours 20 of the past 30 days for compliance of 67%.  Average usage on days used was 6 hours and 6 minutes.  Residual AHI was 1.0 on 6 to 16 cm of water.  There was no significant leak noted.  10/17/2019 ALL:  Jared [name] is a 72 y.o. 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 Dr Jannifer Franklin increased duloxetine to '60mg'$  twice daily for headaches. Headaches continued and he reports that his PCP stopped duloxetine. Since, headaches have resolved. He continues to have intermittent shaking of left arm. Shaking can with activity or at rest. May happen several times a day or may  not happen for a day or two. Shaking lasts about 2-3 minutes then resolves. He has been on Ability for 6-7 years, followed by Dr Darryl Lent, psychiatry. Tremor started shortly afterwards. She feels that he is doing well overall and not bothered by shaking. He does not feel symptoms are worsening.   She was previously on CPAP therapy but needed a new CPAP machine. New machine delivered in 09/23/2019. He continued to use his old CPAP until 10/06/2019. He is doing well with new CPAP machine. He feels that it works well and he is feeling better. Compliance report does reveal consistent use since 10/06/2019. Residual AHI 2.4 on 6-16cmH20. No significant leak noted.   HISTORY: (copied from Dr Edwena Felty note on 08/28/2019)  IRINEO GAULIN is a 72 y.o. year old White or Caucasian male patient seen for a sleep consultation- the patient's primary neurologist is  Dr Jannifer Franklin.  referral on 08/28/2019 from PCP  for a new evaluation.   Chief concern according to patient :  I am getting more and more fatigued, CPAP machine is 74 years old.    I have the pleasure of seeing CALOB BASKETTE today, a right-handed White or Caucasian male with a possible sleep disorder.  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  Patient reports headaches have improved, sleepiness is progressing.  Dr Jannifer Franklin started w treatment of severe depression with Cymbalta. Once a day and he sleeps better  when taking Hydralizine and Belsomra with Metta Clines.    Mr. Welles was seen for a baseline diagnostic polysomnography on January 14, 2014 referred by Dr. Burnard Bunting and at the time less sleepy than today with the Epworth Sleepiness Scale endorsed at 9 out of 24 points.  Sleep efficiency was poor 70.6% and highly fragmented.  The patient had multiple hypopneas rather than apneas 66 respiratory events consisted of 55 shallow breathing spells 1 obstructive apnea and 4 central apneas and 6 mixed apneas with an AHI of 12.9.  The lowest oxygen saturation was 77% at nadir with 41 minutes of desaturation in total.  He also had frequent periodic limb movements at the time he also moved during REM sleep indicative of REM sleep behavior disorder.  He returned for CPAP titration on 06 Feb 2014 his AHI was reduced significantly and his periodic limb movements also improved.  Nadir was now 82% with only 2 minutes of desaturation time.  He started on 10 cmH2O pressure with 3 cm flex or air sense and a nasal pillow.  Normal sinus rhythm by EKG was no injury was noted.   Dr. Jannifer Franklin:  08-10-2019 History of present illness:   Mr. Micciche is a 72 year old right-handed white male with a history of renal cell carcinoma in the past, history of coronary artery disease, asthma, sleep apnea on CPAP, and history of depression on medications to include Abilify.  The patient claims that in early August he began having headaches in the right frontotemporal region.  He has never had any problems with headaches at any time in his life, headaches are very unusual for him.  The patient has noted that the headaches may last 30 minutes to an hour and a half, and may come and go up to 3 times a day, sometimes waking him up out of sleep.  The patient has no nausea, vomiting or photophobia or phonophobia with the headache.  He denies any vision changes or neck pain.  He may take Tylenol with good improvement of the headache.  The  headaches are usually a 5 or 6 out of 10 on a pain scale and are described as a dull achy pain.  The patient reports no sinus drainage or allergy symptoms.  He denies any numbness or weakness of the face, arms, legs.  He has noted recently some problems with tremor that will come and go involving the left upper extremity.  He denies issues controlling the bowels or the bladder.  He was sent to this office for an evaluation.  He recently  had MRI of the brain that was relatively unremarkable, no source of the headache was seen.  The headaches are always on the right frontal temporal area, never anywhere else.  He does not get any redness of the eye or tearing of the eye with a headache.  The patient was placed on Lamisil around the time of onset of headache, but he ran out of his medication a week ago and he has continued to have headaches.   REVIEW OF SYSTEMS: Out of a complete 14 system review of symptoms, the patient complains only of the following symptoms, tremor left arm, headaches, depression, parasomnia, insomnia and all other reviewed systems are negative.  ESS: 8/24   ALLERGIES: Allergies  Allergen Reactions   Contrast Media [Iodinated Contrast Media] Other (See Comments)    Was told not to take d/t pt only having 1 kidney   Nsaids Other (See Comments)    Was told not to take d/t pt only having 1 kidney   Penicillins Hives, Itching and Other (See Comments)    Tolerates Cefepime Has patient had a PCN reaction causing immediate rash, facial/tongue/throat swelling, SOB or lightheadedness with hypotension: Unknown Has patient had a PCN reaction causing severe rash involving mucus membranes or skin necrosis: Unknown Has patient had a PCN reaction that required hospitalization: Unknown Has patient had a PCN reaction occurring within the last 10 years: No If all of the above answers are "NO", then may proceed with Cephalosporin use.    Sulfonamide Derivatives Hives and Itching   Pholcodine     Codeine Nausea Only   Statins Other (See Comments)    Myalgias    HOME MEDICATIONS: Outpatient Medications Prior to Visit  Medication Sig Dispense Refill   acetaminophen (TYLENOL) 500 MG tablet Take 1,500 mg by mouth every 6 (six) hours as needed for mild pain.     albuterol (PROVENTIL) (2.5 MG/3ML) 0.083% nebulizer solution Take 3 mLs (2.5 mg total) by nebulization every 6 (six) hours as needed for wheezing or shortness of breath. 75 mL 12   albuterol (VENTOLIN HFA) 108 (90 Base) MCG/ACT inhaler Inhale 2 puffs into the lungs every 4 (four) hours as needed for wheezing or shortness of breath. Can inhale two puffs every four to six hours as needed for cough, wheeze, shortness of breath, or chest tightness. 18 g 1   aspirin 325 MG EC tablet Take 325 mg by mouth daily.     benzonatate (TESSALON PERLES) 100 MG capsule Take 1 capsule (100 mg total) by mouth 3 (three) times daily as needed for cough. 20 capsule 0   betamethasone dipropionate 0.05 % cream Apply 1 application  topically daily as needed (dry skin).     Budeson-Glycopyrrol-Formoterol (BREZTRI AEROSPHERE) 160-9-4.8 MCG/ACT AERO Inhale 2 puffs into the lungs in the morning and at bedtime. with spacer and rinse mouth afterwards. 11 g 5   budesonide (PULMICORT) 0.25 MG/2ML nebulizer solution Use for respiratory illness or flare up twice daily for 1-2 weeks. 60 mL 5   cefadroxil (DURICEF) 500 MG capsule Take 1 capsule (500 mg total) by mouth 2 (two) times daily. 12 capsule 0   DULoxetine (CYMBALTA) 60 MG capsule Take 60 mg by mouth every morning.     ergocalciferol (VITAMIN D2) 1.25 MG (50000 UT) capsule Take 50,000 Units by mouth once a week. Saturday     esomeprazole (NEXIUM) 40 MG capsule Take 1 capsule (40 mg total) by mouth 2 (two) times daily. 60 capsule 5   Evolocumab (REPATHA  SURECLICK) 893 MG/ML SOAJ Inject 1 Pen into the skin every 14 (fourteen) days. 2 mL 11   ferrous sulfate 325 (65 FE) MG tablet Take 1 tablet (325 mg total) by  mouth daily with breakfast.  3   furosemide (LASIX) 20 MG tablet Take 20 mg by mouth daily as needed for edema.     hyoscyamine (LEVSIN) 0.125 MG tablet Take 1 tablet (0.125 mg total) by mouth every 6 (six) hours as needed for cramping. 30 tablet 0   ipratropium-albuterol (DUONEB) 0.5-2.5 (3) MG/3ML SOLN Take 3 mLs by nebulization every 6 (six) hours as needed (wheezing, coughing, shortness of breath). 360 mL 1   isosorbide mononitrate (IMDUR) 30 MG 24 hr tablet take 1 tablet by mouth once daily (Patient taking differently: at bedtime.) 15 tablet 0   ketoconazole (NIZORAL) 2 % cream Apply topically daily.     levothyroxine (SYNTHROID) 25 MCG tablet Take 25 mcg by mouth every morning.     levothyroxine (SYNTHROID) 50 MCG tablet Take 50 mcg by mouth daily before breakfast.     metroNIDAZOLE (METROCREAM) 0.75 % cream Apply topically daily.     montelukast (SINGULAIR) 10 MG tablet Take 1 tablet (10 mg total) by mouth at bedtime. 90 tablet 1   Multiple Vitamin (MULTIVITAMIN) tablet Take 1 tablet by mouth at bedtime.      omalizumab [name]) 150 MG/ML prefilled syringe Inject 150 mg into the skin every 28 (twenty-eight) days. 1 mL 11   ondansetron (ZOFRAN) 4 MG tablet Take 4 mg by mouth daily as needed for nausea/vomiting.     oxyCODONE (OXY IR/ROXICODONE) 5 MG immediate release tablet Take 1 tablet (5 mg total) by mouth every 4 (four) hours as needed for moderate pain. 10 tablet 0   potassium chloride (KLOR-CON) 10 MEQ tablet Take 10 mEq by mouth daily as needed (low levels).     Spacer/Aero-Holding Chambers [facility] DIAMOND) MISC 1 each by Other route daily. Use as directed with inhaler. 1 each 1   telmisartan (MICARDIS) 20 MG tablet Take 20 mg by mouth daily.     testosterone cypionate (DEPOTESTOSTERONE CYPIONATE) 200 MG/ML injection Inject 200 mg into the muscle every 30 (thirty) days.     vitamin C (ASCORBIC ACID) 500 MG tablet Take 500 mg by mouth 2 (two) times daily.      amLODipine (NORVASC)  10 MG tablet Take 10 mg by mouth daily.     ARIPiprazole (ABILIFY) 5 MG tablet Take 5 mg by mouth every morning.     traZODone (DESYREL) 150 MG tablet TAKE 1 TABLET(150 MG) BY MOUTH AT BEDTIME AS NEEDED FOR SLEEP 30 tablet 2   traZODone (DESYREL) 50 MG tablet Take 1 tablet (50 mg total) by mouth at bedtime. 90 tablet 3   Facility-Administered Medications Prior to Visit  Medication Dose Route Frequency Provider Last Rate Last Admin   omalizumab [name]) injection 150 mg  150 mg Subcutaneous Q28 days Dara Hoyer, FNP   150 mg at 06/20/22 1503   omalizumab [name]) prefilled syringe 150 mg  150 mg Subcutaneous Q28 days Larose Kells, MD   150 mg at 08/23/22 1113    PAST MEDICAL HISTORY: Past Medical History:  Diagnosis Date   Anxiety    Asthma    Cervical vertebral fusion 05/02/2014   Now presenting with C5-6 radiculopathy   Coronary artery disease    COVID 01/2020   pain all over monoclonal antibodies given all symptoms resolved   GERD (gastroesophageal reflux disease)  H/O heart artery stent 2000   to right rca   Heart attack ([place]) 2009   History of depression 03/19/2021   History of esophageal stricture    History of spinal fracture 2015   Hyperlipidemia    Hypertension    Hypothyroidism    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[place])    left arm tremor   Osteoarthritis    lower back   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   Renal cell carcinoma 1997   Left kidney   Sleep apnea    wears CPAP   Sleep talking    Snoring 12/25/2013   Wears glasses     PAST SURGICAL HISTORY: Past Surgical History:  Procedure Laterality Date   ANTERIOR CERVICAL DECOMP/DISCECTOMY FUSION N/A 08/27/2014   Procedure: Cervical six-seven anterior cervical decompression fusion with removal of hardware at Cervical five-six;  Surgeon: Erline Levine, MD;  Location: [place] [facility];  Service: Neurosurgery;  Laterality: N/A;  Cervical six-seven anterior cervical decompression fusion with removal of  hardware at Cervical five-six   BACK SURGERY     1992, 2013 lower back   CARDIAC CATHETERIZATION  05/10/2007   Minimal CAD, normal LV systolic function, medical management   CARDIAC CATHETERIZATION  04/08/2008   RCA ulcerated 70-80% stenosis, stented with a 3x16m Endeavor stent at 13atm for 50sec, reduced from 80% ulcerated stenosis to 0%.   CARDIAC CATHETERIZATION  05/07/2009   50% distal left main disease-IVUS or flow wire too dangerous in particular setting to perform intervention.   CARDIAC CATHETERIZATION  03/18/2010   Medical management   CARDIOVASCULAR STRESS TEST  02/09/2012   Normal, no significant wall abnormalities noted   CARPAL TUNNEL RELEASE Bilateral 2000   COLONOSCOPY     COLONOSCOPY W/ BIOPSIES AND POLYPECTOMY     [facility]   [facility]    [place]   NECK SURGERY  2005   NEPHRECTOMY Left 1997   secondary to cancer   TOTAL KNEE ARTHROPLASTY Left 2005   TOTAL KNEE ARTHROPLASTY Right 04/13/2018   Procedure: RIGHT TOTAL KNEE ARTHROPLASTY;  Surgeon: NNetta Cedars MD;  [facility] [facility]  Service: Orthopedics;  Laterality: Right;   TRANSURETHRAL RESECTION OF PROSTATE N/A 03/25/2021   Procedure: TRANSURETHRAL RESECTION OF THE PROSTATE (TURP);  Surgeon: DFranchot Gallo MD;  [facility]  Service: Urology;  Laterality: N/A;    FAMILY HISTORY: Family History  Problem Relation Age of Onset   Asthma Mother    Heart disease Mother    Hypertension Mother    Thyroid disease Mother    Esophageal cancer Father    Barrett's esophagus Father    Bone cancer Father        mets from esophagus   Heart disease Father    Cancer Father    Depression Father    Anxiety disorder Father    Alcoholism Father    Obesity Father    Diabetes Sister    Cancer Sister        Cervical cancer   Colon cancer Maternal Uncle    Heart  disease Paternal Grandfather    Heart attack Paternal Grandfather 536  Stomach cancer Neg Hx    Colon polyps Neg Hx    Rectal cancer Neg Hx     SOCIAL HISTORY: Social History   Socioeconomic History   Marital status: Married  Spouse name: Not on file   Number of children: 1   Years of education: 68   Highest education level: Not on file  Occupational History   Occupation: OWNER. Landscape Design/horticulture    Employer: [employer]   Occupation: OWNER    Employer: [employer]    Comment: retired  Tobacco Use   Smoking status: Never   Smokeless tobacco: Never  Vaping Use   Vaping Use: Never used  Substance and Sexual Activity   Alcohol use: Not Currently   Drug use: No   Sexual activity: Not Currently  Other Topics Concern   Not on file  Social History Narrative   Patient is married [name]).   Patient drinks one cup of coffee but not everyday.   Patient has one child.   Patient has a college education.   Patient is right-handed.   Social Determinants of Health   Financial Resource Strain: Not on file  Food Insecurity: Not on file  Transportation Needs: Not on file  Physical Activity: Not on file  Stress: Not on file  Social Connections: Not on file  Intimate Partner Violence: Not on file      PHYSICAL EXAM  Vitals:   10/05/22 0914  BP: 124/69  Pulse: 66  Weight: 197 lb 8 oz (89.6 kg)  Height: '5\' 7"'$  (1.702 m)      Body mass index is 30.93 kg/m.  Generalized: Well developed, in no acute distress, masked face Cardiology: normal rate and rhythm, no murmur noted Respiratory: clear to auscultation bilaterally  Neurological examination  Mentation: Alert oriented to time, place, history taking. Follows all commands speech and language fluent Cranial nerve II-XII: Pupils were equal round reactive to light. Extraocular movements were full, visual field were full on confrontational test. Facial sensation and strength were normal. Head turning  and shoulder shrug  were normal and symmetric. Motor: The motor testing reveals 5 over 5 strength of all 4 extremities. Good symmetric motor tone is noted throughout. INO resting tremor of left hand noted, today. Very mild action tremor noted of left hand with coordination testing. No obvious cogwheel rigidity. No bradykinesia noted.  Sensory: Sensory testing is intact to soft touch on all 4 extremities. No evidence of extinction is noted.  Coordination: Cerebellar testing reveals good finger-nose-finger and heel-to-shin bilaterally.  Gait and station: Able to push to standing position. Posture is very slightly stooped. Gait is narrow but stable without assistive device. Slight decrease in arm swing    DIAGNOSTIC DATA (LABS, IMAGING, TESTING) - I reviewed patient records, labs, notes, testing and imaging myself where available.      No data to display           Lab Results  Component Value Date   WBC 9.4 08/31/2022   HGB 14.1 08/31/2022   HCT 42.9 08/31/2022   MCV 81.0 08/31/2022   PLT 286.0 08/31/2022      Component Value Date/Time   NA 141 03/03/2022 0346   NA 139 02/02/2021 1127   K 4.1 03/03/2022 0346   CL 107 03/03/2022 0346   CO2 27 03/03/2022 0346   GLUCOSE 97 03/03/2022 0346   BUN 19 03/03/2022 0346   BUN 27 02/02/2021 1127   CREATININE 1.25 (H) 03/03/2022 0346   CREATININE 1.41 (H) 06/25/2013 1051   CALCIUM 8.8 (L) 03/03/2022 0346   PROT 7.0 03/02/2022 0342   PROT 7.4 05/14/2021 1158   ALBUMIN 3.3 (L) 03/02/2022 0342   ALBUMIN 4.4 05/14/2021 1158   AST  14 (L) 03/02/2022 0342   ALT 17 03/02/2022 0342   ALKPHOS 65 03/02/2022 0342   BILITOT 0.8 03/02/2022 0342   BILITOT 0.3 05/14/2021 1158   GFRNONAA >60 03/03/2022 0346   GFRAA 56 (L) 10/28/2020 1127   Lab Results  Component Value Date   CHOL 152 05/14/2021   HDL 36 (L) 05/14/2021   LDLCALC 77 05/14/2021   TRIG 237 (H) 05/14/2021   CHOLHDL 4.2 05/14/2021   Lab Results  Component Value Date   HGBA1C  6.5 (H) 03/01/2022   Lab Results  Component Value Date   WEXHBZJI96 789 06/02/2021   Lab Results  Component Value Date   TSH 2.775 06/02/2021       No data to display             10/05/2022    9:49 AM 02/17/2022    2:17 PM  Montreal Cognitive Assessment   Visuospatial/ Executive (0/5) 5 3  Naming (0/3) 3 3  Attention: Read list of digits (0/2) 2 2  Attention: Read list of letters (0/1) 1 1  Attention: Serial 7 subtraction starting at 100 (0/3) 3 3  Language: Repeat phrase (0/2) 2 1  Language : Fluency (0/1) 1 0  Abstraction (0/2) 2 2  Delayed Recall (0/5) 4 5  Orientation (0/6) 6 6  Total 29 26     ASSESSMENT AND PLAN 72 y.o. 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 here with      ICD-10-CM   1. 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 OSA on CPAP  G47.33     3. Insomnia due to mental condition  F51.05     4. Memory loss  R41.3       Azaria reports feeling better over the past 6 months.Memory seems stable. MOCA 29/30, previously 26/30. Tremor is mild. Gait is stable. He is sleeping well. He discontinued trazodone as it was not effective. CPAP compliance shows sub optimal usage. He was encouraged to use CPAP nightly for at least 4 hours. Mood is good. He will continue close follow up with psychiatry and primary care. I have encouraged him to see a counselor for CBT if sleep issues return. Healthy  lifestyle habits encouraged. He has an appt with Dr Brett Fairy 11/2022. We will reschedule this for around 4-6 months. He verbalizes understanding and agreement with this plan.    No orders of the defined types were placed in this encounter.     No orders of the defined types were placed in this encounter.      Debbora Presto, FNP-C 10/05/2022, 9:58 AM [facility] [address] [place], [place] [address] [phone]

## 2022-10-05 ENCOUNTER — Other Ambulatory Visit (HOSPITAL_COMMUNITY): Payer: Self-pay

## 2022-10-05 ENCOUNTER — Encounter: Payer: Self-pay | Admitting: Family Medicine

## 2022-10-05 ENCOUNTER — Ambulatory Visit: Payer: Medicare PPO | Admitting: Family Medicine

## 2022-10-05 ENCOUNTER — Other Ambulatory Visit (HOSPITAL_BASED_OUTPATIENT_CLINIC_OR_DEPARTMENT_OTHER): Payer: Self-pay | Admitting: Cardiovascular Disease

## 2022-10-05 VITALS — BP 124/69 | HR 66 | Ht 67.0 in | Wt 197.5 lb

## 2022-10-05 DIAGNOSIS — G212 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: Secondary | ICD-10-CM | POA: Diagnosis not present

## 2022-10-05 DIAGNOSIS — G4733 Obstructive sleep apnea (adult) (pediatric): Secondary | ICD-10-CM | POA: Diagnosis not present

## 2022-10-05 DIAGNOSIS — F5105 Insomnia due to other mental disorder: Secondary | ICD-10-CM

## 2022-10-05 DIAGNOSIS — R413 Other amnesia: Secondary | ICD-10-CM

## 2022-10-05 DIAGNOSIS — I779 Disorder of arteries and arterioles, unspecified: Secondary | ICD-10-CM

## 2022-10-06 ENCOUNTER — Telehealth: Payer: Self-pay | Admitting: Family Medicine

## 2022-10-06 NOTE — Telephone Encounter (Signed)
Pt states [name]. NP told him that his tremor would be monitored.  Pt is wanting to know what is actually going to be done now besides just monitoring, please call to discuss plan of care in further detail

## 2022-10-06 NOTE — Telephone Encounter (Signed)
Called pt and relayed message from [place]. He verbalized understanding and appreciation. He will keep scheduled appt 04/05/23 with Dr. Brett Fairy for follow up.

## 2022-10-11 DIAGNOSIS — F3181 Bipolar II disorder: Secondary | ICD-10-CM | POA: Diagnosis not present

## 2022-10-12 ENCOUNTER — Institutional Professional Consult (permissible substitution): Payer: Medicare PPO | Admitting: Neurology

## 2022-10-25 ENCOUNTER — Ambulatory Visit: Payer: Medicare PPO | Admitting: Allergy and Immunology

## 2022-10-26 ENCOUNTER — Telehealth: Payer: Self-pay | Admitting: Gastroenterology

## 2022-10-26 ENCOUNTER — Other Ambulatory Visit (INDEPENDENT_AMBULATORY_CARE_PROVIDER_SITE_OTHER): Payer: Medicare PPO

## 2022-10-26 ENCOUNTER — Encounter: Payer: Self-pay | Admitting: Gastroenterology

## 2022-10-26 ENCOUNTER — Ambulatory Visit: Payer: Medicare PPO | Admitting: Gastroenterology

## 2022-10-26 ENCOUNTER — Other Ambulatory Visit: Payer: Self-pay

## 2022-10-26 VITALS — BP 116/66 | HR 64 | Ht 67.0 in | Wt 195.0 lb

## 2022-10-26 DIAGNOSIS — Z79899 Other long term (current) drug therapy: Secondary | ICD-10-CM

## 2022-10-26 DIAGNOSIS — K760 Fatty (change of) liver, not elsewhere classified: Secondary | ICD-10-CM

## 2022-10-26 DIAGNOSIS — R143 Flatulence: Secondary | ICD-10-CM

## 2022-10-26 DIAGNOSIS — R1032 Left lower quadrant pain: Secondary | ICD-10-CM | POA: Diagnosis not present

## 2022-10-26 DIAGNOSIS — K219 Gastro-esophageal reflux disease without esophagitis: Secondary | ICD-10-CM

## 2022-10-26 DIAGNOSIS — F3181 Bipolar II disorder: Secondary | ICD-10-CM | POA: Diagnosis not present

## 2022-10-26 DIAGNOSIS — D509 Iron deficiency anemia, unspecified: Secondary | ICD-10-CM

## 2022-10-26 DIAGNOSIS — K5730 Diverticulosis of large intestine without perforation or abscess without bleeding: Secondary | ICD-10-CM

## 2022-10-26 LAB — IBC + FERRITIN
Ferritin: 90.5 ng/mL (ref 22.0–322.0)
Iron: 38 ug/dL — ABNORMAL LOW (ref 42–165)
Saturation Ratios: 14 % — ABNORMAL LOW (ref 20.0–50.0)
TIBC: 271.6 ug/dL (ref 250.0–450.0)
Transferrin: 194 mg/dL — ABNORMAL LOW (ref 212.0–360.0)

## 2022-10-26 MED ORDER — ESOMEPRAZOLE MAGNESIUM 20 MG PO CPDR: 20.0000 mg | DELAYED_RELEASE_CAPSULE | Freq: Every day | ORAL | 1 refills | Status: DC

## 2022-10-26 MED ORDER — SIMETHICONE 80 MG PO CHEW: 80.0000 mg | CHEWABLE_TABLET | Freq: Four times a day (QID) | ORAL | 0 refills | Status: DC | PRN

## 2022-10-26 NOTE — Patient Instructions (Addendum)
_______________________________________________________  If your blood pressure at your visit was 140/90 or greater, please contact your primary care physician to follow up on this.  _______________________________________________________  If you are age 74 or older, your body mass index should be between 23-30. Your Body mass index is 30.54 kg/m. If this is out of the aforementioned range listed, please consider follow up with your Primary Care Provider.  If you are age 86 or younger, your body mass index should be between 19-25. Your Body mass index is 30.54 kg/m. If this is out of the aformentioned range listed, please consider follow up with your Primary Care Provider.   ________________________________________________________  The [facility] providers would like to encourage you to use [facility] to communicate with providers for non-urgent requests or questions.  Due to long hold times on the telephone, sending your provider a message by [facility] may be a faster and more efficient way to get a response.  Please allow 48 business hours for a response.  Please remember that this is for non-urgent requests.  _______________________________________________________  Please go to the lab in the basement of our building to have lab work done as you leave today. Hit "B" for basement when you get on the elevator.  When the doors open the lab is on your left.  We will call you with the results. Thank you.   Decrease Nexium to '20mg'$  once daily. We have sent a new script to your pharmacy.  We are giving you a Low-FODMAP diet handout today. FODMAPs are short-chain carbohydrates (sugars) that are highly fermentable, which means that they go through chemical changes in the GI system, and are poorly absorbed during digestion. When FODMAPs reach the colon (large intestine), bacteria ferment these sugars, turning them into gas and chemicals. This stretches the walls of the colon, causing abdominal bloating,  distension, cramping, pain, and/or changes in bowel habits in many patients with IBS. FODMAPs are not unhealthy or harmful, but may exacerbate GI symptoms in those with sensitive GI tracts.  You can use [employer] as needed.  Thank you for entrusting me with your care and for choosing [facility], Dr. [place] Cellar

## 2022-10-26 NOTE — Telephone Encounter (Signed)
Lm on vm for patient to return call 

## 2022-10-26 NOTE — Progress Notes (Signed)
HPI :  72 year old male with history of Parkinson's, CAD with stent placement, OSA on CPAP, GERD, diverticulitis, here for follow-up visit for heme positive stool.   Recall he had a heme positive stool test July 2023 with his primary care at yearly physical.  He denied any overt blood loss at the time. He has had a few episodes of diverticulitis in recent years. Colonoscopy with me in September 2022 to follow-up left-sided diverticulitis he had recently had.  He had 6 diminutive adenomas removed at that time and I recommended a repeat exam in 3 years.   I checked his labs in October of last year, he had a mild iron deficiency but a normal hemoglobin.  This led to upper endoscopy on November 15.  No cause for his anemia there.  He had no evidence of Barrett's.  He subsequently underwent a capsule endoscopy with [place] in December which was normal.  He has maintained on iron supplement, ferrous sulfate 325 mg daily.  His last hemoglobin was in December, hemoglobin 14.1, MCV 81, iron studies improved although iron level still slightly low.  He continues to deny any problems with any overt bleeding.  He is moving his bowels once daily or every other day, stool is been dark since has been taking iron.  He takes of aspirin every day, no other blood thinners  He takes Nexium 40 mg once daily for history of reflux.  He has been on this a long time.  He reminds me he has 1 kidney from prior nephrectomy.  Of note he has had hepatic steatosis noted on labs.  No evidence of cirrhosis.  He denies any alcohol use now but previously had drank 3-4 beers per day with additional liquor on top of that for about a 10-year which he stopped about 4 years ago.  He has been working on weight loss, exercising Monday Wednesday Friday, and on diet.  He is lost about 10 pounds intentionally and is happy about this.  Otherwise endorses some excessive gas at times that can bother his wife but does not really bother him.  He denies  any bloating/abdominal distention or pain.  He also endorses occasional left lower quadrant discomfort only when he lies down at night and when he changes position it goes away.  This never happens during the day.   Prior workup: Colonoscopy 06/01/2021 - 6 diminutive adenomatous polyps with diverticulosis recall 3 years     10/28/2021 CT abdomen pelvis without contrast 1. Abnormal mesenteric stranding along the junction of the descending and sigmoid colon. There is substantial diverticulosis in this region and I would favor diverticulitis over an unrelated distal colitis. No extraluminal gas or abscess. 2. Complete atelectasis of the right middle lobe. I do not see a definite central obstructing lesion, but the right middle lobe bronchus and its branches appear narrow. A specific cause for the right middle lobe atelectasis is not identified on today's noncontrast abdomen CT. 3. Other imaging findings of potential clinical significance: Aortic Atherosclerosis (ICD10-I70.0). Coronary atherosclerosis. Diffuse hepatic steatosis. Bilateral mild foraminal impingement at L5-S1.   EGD 06/2007 - benign shatski ring, otherwise normal   Echo 04/06/22: EF 64%, mild LVH    CT abdomen  pelvis without contrast 02/28/22: IMPRESSION: Colonic diverticulosis with stranding around the distal descending colon and proximal sigmoid colon concerning for active diverticulitis. Hepatic steatosis. Aortic atherosclerosis.   CT scan 02/20/20 - sigmoid diverticulitis    EGD 08/03/22: - Esophagogastric landmarks identified. - Normal esophagus. -  Normal stomach. Biopsied. - Normal examined duodenum. Biopsied. No cause for dark or heme positive stools. Biopsies taken in light of iron deficiency.  1. Surgical [P], duodenal - BENIGN SMALL BOWEL MUCOSA WITH NO SIGNIFICANT PATHOLOGIC CHANGES 2. Surgical [P], gastric antrum and gastric body - MILD CHRONIC GASTRITIS - NEGATIVE FOR H. PYLORI ON  IMMUNOHISTOCHEMICAL STAIN - NEGATIVE FOR INTESTINAL METAPLASIA, DYSPLASIA OR MALIGNANCY   Capsule endoscopy 09/07/22: Essentially this was a normal study with adequate prep, no concerning pathology noted to cause iron deficiency anemia.     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[place])    left arm tremor   Osteoarthritis    lower back   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   Renal cell carcinoma 1997   Left kidney   Sleep apnea    wears CPAP   Sleep talking    Snoring 12/25/2013   Wears glasses      Past Surgical History:  Procedure Laterality Date   ANTERIOR CERVICAL DECOMP/DISCECTOMY FUSION N/A 08/27/2014   Procedure: Cervical six-seven anterior cervical decompression fusion with removal of hardware at Cervical five-six;  Surgeon: Erline Levine, MD;  Location: [facility];  Service: Neurosurgery;  Laterality: N/A;  Cervical six-seven anterior cervical decompression fusion with removal of hardware at Cervical five-six   BACK SURGERY     1992, 2013 lower back   CARDIAC CATHETERIZATION  05/10/2007   Minimal CAD, normal LV systolic function, medical management   CARDIAC CATHETERIZATION  04/08/2008   RCA ulcerated 70-80% stenosis, stented with a 3x31m Endeavor stent at 13atm for 50sec, reduced from 80% ulcerated stenosis to 0%.   CARDIAC CATHETERIZATION  05/07/2009   50% distal left main disease-IVUS or flow wire too dangerous in particular setting to  perform intervention.   CARDIAC CATHETERIZATION  03/18/2010   Medical management   CARDIOVASCULAR STRESS TEST  02/09/2012   Normal, no significant wall abnormalities noted   CARPAL TUNNEL RELEASE Bilateral 2000   COLONOSCOPY     COLONOSCOPY W/ BIOPSIES AND POLYPECTOMY     COL[facility] WITH PROPOFOL  [facility]20[place]   [facility]    as [place] [facility]   NECK SURGERY  2005   NEPHRECTOMY Left 1997   secondary to cancer   TOTAL KNEE ARTHROPLASTY Left 2005   TOTAL KNEE ARTHROPLASTY Right 04/13/2018   Procedure: RIGHT TOTAL KNEE ARTHROPLASTY;  Surgeon: NNetta Cedars MD;  [facility]  Service: Orthopedics;  Laterality: Right;   TRANSURETHRAL RESECTION OF PROSTATE N/A 03/25/2021   Procedure: TRANSURETHRAL RESECTION OF THE PROSTATE (TURP);  Surgeon: DFranchot Gallo MD;  [facility]  Service: Urology;  Laterality: N/A;   Family History  Problem Relation Age of Onset   Asthma Mother    Heart disease Mother  Hypertension Mother    Thyroid disease Mother    Esophageal cancer Father    Barrett's esophagus Father    Bone cancer Father        mets from esophagus   Heart disease Father    Cancer Father    Depression Father    Anxiety disorder Father    Alcoholism Father    Obesity Father    Diabetes Sister    Cancer Sister        Cervical cancer   Colon cancer Maternal Uncle    Heart disease Paternal Grandfather    Heart attack Paternal Grandfather 4   Stomach cancer Neg Hx    Colon polyps Neg Hx    Rectal cancer Neg Hx    Social History   Tobacco Use   Smoking status: Never   Smokeless tobacco: Never  Vaping Use   Vaping Use: Never used  Substance Use Topics   Alcohol use: Not Currently   Drug use: No   Current Outpatient Medications  Medication Sig Dispense Refill   acetaminophen (TYLENOL) 500 MG tablet Take 1,500 mg by mouth every 6 (six) hours as  needed for mild pain.     albuterol (PROVENTIL) (2.5 MG/3ML) 0.083% nebulizer solution Take 3 mLs (2.5 mg total) by nebulization every 6 (six) hours as needed for wheezing or shortness of breath. 75 mL 12   albuterol (VENTOLIN HFA) 108 (90 Base) MCG/ACT inhaler Inhale 2 puffs into the lungs every 4 (four) hours as needed for wheezing or shortness of breath. Can inhale two puffs every four to six hours as needed for cough, wheeze, shortness of breath, or chest tightness. 18 g 1   aspirin 325 MG EC tablet Take 325 mg by mouth daily.     benzonatate (TESSALON PERLES) 100 MG capsule Take 1 capsule (100 mg total) by mouth 3 (three) times daily as needed for cough. 20 capsule 0   betamethasone dipropionate 0.05 % cream Apply 1 application  topically daily as needed (dry skin).     Budeson-Glycopyrrol-Formoterol (BREZTRI AEROSPHERE) 160-9-4.8 MCG/ACT AERO Inhale 2 puffs into the lungs in the morning and at bedtime. with spacer and rinse mouth afterwards. 11 g 5   budesonide (PULMICORT) 0.25 MG/2ML nebulizer solution Use for respiratory illness or flare up twice daily for 1-2 weeks. 60 mL 5   cefadroxil (DURICEF) 500 MG capsule Take 1 capsule (500 mg total) by mouth 2 (two) times daily. 12 capsule 0   DULoxetine (CYMBALTA) 60 MG capsule Take 60 mg by mouth every morning.     ergocalciferol (VITAMIN D2) 1.25 MG (50000 UT) capsule Take 50,000 Units by mouth once a week. Saturday     esomeprazole (NEXIUM) 40 MG capsule Take 1 capsule (40 mg total) by mouth 2 (two) times daily. 60 capsule 5   Evolocumab (REPATHA SURECLICK) 109 MG/ML SOAJ Inject 1 Pen into the skin every 14 (fourteen) days. 2 mL 11   ferrous sulfate 325 (65 FE) MG tablet Take 1 tablet (325 mg total) by mouth daily with breakfast.  3   furosemide (LASIX) 20 MG tablet Take 20 mg by mouth daily as needed for edema.     hyoscyamine (LEVSIN) 0.125 MG tablet Take 1 tablet (0.125 mg total) by mouth every 6 (six) hours as needed for cramping. 30 tablet 0    ipratropium-albuterol (DUONEB) 0.5-2.5 (3) MG/3ML SOLN Take 3 mLs by nebulization every 6 (six) hours as needed (wheezing, coughing, shortness of breath). 360 mL 1   isosorbide  mononitrate (IMDUR) 30 MG 24 hr tablet take 1 tablet by mouth once daily (Patient taking differently: at bedtime.) 15 tablet 0   ketoconazole (NIZORAL) 2 % cream Apply topically daily.     levothyroxine (SYNTHROID) 25 MCG tablet Take 25 mcg by mouth every morning.     levothyroxine (SYNTHROID) 50 MCG tablet Take 50 mcg by mouth daily before breakfast.     metroNIDAZOLE (METROCREAM) 0.75 % cream Apply topically daily.     montelukast (SINGULAIR) 10 MG tablet Take 1 tablet (10 mg total) by mouth at bedtime. 90 tablet 1   Multiple Vitamin (MULTIVITAMIN) tablet Take 1 tablet by mouth at bedtime.      omalizumab [name]) 150 MG/ML prefilled syringe Inject 150 mg into the skin every 28 (twenty-eight) days. 1 mL 11   ondansetron (ZOFRAN) 4 MG tablet Take 4 mg by mouth daily as needed for nausea/vomiting.     oxyCODONE (OXY IR/ROXICODONE) 5 MG immediate release tablet Take 1 tablet (5 mg total) by mouth every 4 (four) hours as needed for moderate pain. 10 tablet 0   potassium chloride (KLOR-CON) 10 MEQ tablet Take 10 mEq by mouth daily as needed (low levels).     Spacer/Aero-Holding Chambers [facility] DIAMOND) MISC 1 each by Other route daily. Use as directed with inhaler. 1 each 1   telmisartan (MICARDIS) 20 MG tablet Take 20 mg by mouth daily.     testosterone cypionate (DEPOTESTOSTERONE CYPIONATE) 200 MG/ML injection Inject 200 mg into the muscle every 30 (thirty) days.     vitamin C (ASCORBIC ACID) 500 MG tablet Take 500 mg by mouth 2 (two) times daily.      Current Facility-Administered Medications  Medication Dose Route Frequency Provider Last Rate Last Admin   omalizumab [name]) injection 150 mg  150 mg Subcutaneous Q28 days Dara Hoyer, FNP   150 mg at 06/20/22 1503   omalizumab [name]) prefilled syringe 150 mg   150 mg Subcutaneous Q28 days Larose Kells, MD   150 mg at 08/23/22 1113   Allergies  Allergen Reactions   Contrast Media [Iodinated Contrast Media] Other (See Comments)    Was told not to take d/t pt only having 1 kidney   Nsaids Other (See Comments)    Was told not to take d/t pt only having 1 kidney   Penicillins Hives, Itching and Other (See Comments)    Tolerates Cefepime Has patient had a PCN reaction causing immediate rash, facial/tongue/throat swelling, SOB or lightheadedness with hypotension: Unknown Has patient had a PCN reaction causing severe rash involving mucus membranes or skin necrosis: Unknown Has patient had a PCN reaction that required hospitalization: Unknown Has patient had a PCN reaction occurring within the last 10 years: No If all of the above answers are "NO", then may proceed with Cephalosporin use.    Sulfonamide Derivatives Hives and Itching   Pholcodine    Codeine Nausea Only   Statins Other (See Comments)    Myalgias     Review of Systems: All systems reviewed and negative except where noted in HPI.   Lab Results  Component Value Date   WBC 9.4 08/31/2022   HGB 14.1 08/31/2022   HCT 42.9 08/31/2022   MCV 81.0 08/31/2022   PLT 286.0 08/31/2022    Lab Results  Component Value Date   CREATININE 1.25 (H) 03/03/2022   BUN 19 03/03/2022   NA 141 03/03/2022   K 4.1 03/03/2022   CL 107 03/03/2022   CO2 27 03/03/2022  Lab Results  Component Value Date   ALT 17 03/02/2022   AST 14 (L) 03/02/2022   ALKPHOS 65 03/02/2022   BILITOT 0.8 03/02/2022     Physical Exam: BP 116/66   Pulse 64   Ht '5\' 7"'$  (1.702 m)   Wt 195 lb (88.5 kg)   BMI 30.54 kg/m  Constitutional: Pleasant,well-developed, male in no acute distress. Abdominal: Soft, nondistended, nontender. Diastasis recti - negative [place].   There are no masses palpable.  Neurological: Alert and oriented to person place and time. Psychiatric: Normal mood and affect. Behavior is  normal.   ASSESSMENT: 72 y.o. male here for assessment of the following  1. Iron deficiency anemia, unspecified iron deficiency anemia type   2. Diverticulosis of colon without hemorrhage   3. Fatty liver   4. Gastroesophageal reflux disease, unspecified whether esophagitis present   5. Long-term current use of proton pump inhibitor therapy   6. LLQ pain   7. Excessive gas    No clear cause on EGD and capsule endoscopy for IDA.  His colonoscopy was fairly recent.  No overt bleeding.  He is on iron and hemoglobin normal with improvement of iron studies in December.  Discussed with him if he wanted to pursue a repeat colonoscopy.  I think this is likely to be low yield given his last 1 was fairly recent.  He would prefer to continue iron and repeat his iron studies.  If his iron deficiency persist despite iron supplementation he may consider a repeat colonoscopy otherwise if his labs look better on iron he would prefer to hold off for now.  I think that is reasonable  He has a history of fatty liver, liver enzymes have been normal in recent years.  This could be in part due to his history of alcohol use but has since abstained.  He has been working on weight loss which is good.  His fib 4 is less than 1, very low risk for fibrotic change.  Will continue to work on weight loss and trend LFTs yearly  He otherwise has well-controlled reflux with no history of Barrett's.  He is on a moderate dose of PPI, counseled him on long-term risks of chronic PPI use, he has 1 kidney, would like to reduce his dose of Nexium if he tolerates it.  Will transition to 20 mg once daily and continue that if he feels well on it.  If it does not control his symptoms adequately he can increase dosing as needed.  I think his left lower quadrant pain based on history is more than likely musculoskeletal.  It is very positional and only occurs at night recumbent position.  He will keep an eye on this and let me know if it  worsens or persists over time.  We discussed intestinal gas and bloating, this does not really bother him too much, could be eating gas-forming foods that could be contributing.  Given the low FODMAP diet handout just to see if he is eating any high risk foods that produce gas.  He can use simethicone as needed as well.  PLAN: - lab today CBC and TIBC / ferritin - will do colonoscopy if remains iron deficient or anemic. Otherwise if not he would prefer to hold off on colonoscopy for now - Fib4 < 1, working on weight loss with diet exercise for fatty liver - repeat LFTs yearly - reduce nexium to '20mg'$  / day, reduce risk for CKD, if he tolerates - suspect musculoskeletal  discomfort based on history, he will monitor and let me know if worsens / persists - low FODMAP diet handout for gas, avoid high risk foods that cause gas - trial if simethicone PRN  Jolly Mango, MD [facility]

## 2022-10-26 NOTE — Telephone Encounter (Signed)
Pt returned call. See 2/7 lab result note for details.

## 2022-10-26 NOTE — Telephone Encounter (Signed)
Inbound call from patient returning call in regards to results.

## 2022-10-28 DIAGNOSIS — Z85828 Personal history of other malignant neoplasm of skin: Secondary | ICD-10-CM | POA: Diagnosis not present

## 2022-10-28 DIAGNOSIS — L821 Other seborrheic keratosis: Secondary | ICD-10-CM | POA: Diagnosis not present

## 2022-10-28 DIAGNOSIS — D485 Neoplasm of uncertain behavior of skin: Secondary | ICD-10-CM | POA: Diagnosis not present

## 2022-10-28 DIAGNOSIS — D2261 Melanocytic nevi of right upper limb, including shoulder: Secondary | ICD-10-CM | POA: Diagnosis not present

## 2022-10-28 DIAGNOSIS — D0359 Melanoma in situ of other part of trunk: Secondary | ICD-10-CM | POA: Diagnosis not present

## 2022-10-28 DIAGNOSIS — L578 Other skin changes due to chronic exposure to nonionizing radiation: Secondary | ICD-10-CM | POA: Diagnosis not present

## 2022-10-28 DIAGNOSIS — Z85820 Personal history of malignant melanoma of skin: Secondary | ICD-10-CM | POA: Diagnosis not present

## 2022-10-28 DIAGNOSIS — L570 Actinic keratosis: Secondary | ICD-10-CM | POA: Diagnosis not present

## 2022-10-28 DIAGNOSIS — L814 Other melanin hyperpigmentation: Secondary | ICD-10-CM | POA: Diagnosis not present

## 2022-10-28 DIAGNOSIS — L719 Rosacea, unspecified: Secondary | ICD-10-CM | POA: Diagnosis not present

## 2022-11-09 DIAGNOSIS — F3181 Bipolar II disorder: Secondary | ICD-10-CM | POA: Diagnosis not present

## 2022-11-11 DIAGNOSIS — J449 Chronic obstructive pulmonary disease, unspecified: Secondary | ICD-10-CM | POA: Diagnosis not present

## 2022-11-11 DIAGNOSIS — J069 Acute upper respiratory infection, unspecified: Secondary | ICD-10-CM | POA: Diagnosis not present

## 2022-11-11 DIAGNOSIS — R059 Cough, unspecified: Secondary | ICD-10-CM | POA: Diagnosis not present

## 2022-11-11 DIAGNOSIS — R0981 Nasal congestion: Secondary | ICD-10-CM | POA: Diagnosis not present

## 2022-11-11 DIAGNOSIS — I129 Hypertensive chronic kidney disease with stage 1 through stage 4 chronic kidney disease, or unspecified chronic kidney disease: Secondary | ICD-10-CM | POA: Diagnosis not present

## 2022-11-11 DIAGNOSIS — Z1152 Encounter for screening for COVID-19: Secondary | ICD-10-CM | POA: Diagnosis not present

## 2022-11-11 DIAGNOSIS — R5383 Other fatigue: Secondary | ICD-10-CM | POA: Diagnosis not present

## 2022-11-11 DIAGNOSIS — N1831 Chronic kidney disease, stage 3a: Secondary | ICD-10-CM | POA: Diagnosis not present

## 2022-11-13 ENCOUNTER — Telehealth: Payer: Self-pay | Admitting: Allergy & Immunology

## 2022-11-13 NOTE — Telephone Encounter (Signed)
I received a call from Jared [name]'s wife reporting SOB, persistent worsening coughing, and generalized malaise. He has had these symptoms for a couple of weeks but they got worse Thursday into Friday. Patient's wife was wondering whether he could have a DepoMedrol injection. I explained that all of our offices were closed on Friday February 23rd, so that would not be possible. I did call in some prednisone for him to start and recommended taking him to Urgent Care or ED. Wife said that she would call his PCP to get him seen.  Wife updated me later and he did go to see his PCP on Friday afternoon and was given a DepoMedrol injection. Wife thanked me for calling in prednisone as well.  Salvatore Marvel, MD [facility] [place] [facility] [place]

## 2022-11-15 NOTE — Telephone Encounter (Signed)
Great-  let's get him back to Dr. Neldon Mc!   Salvatore Marvel, MD [facility] [place] [facility] [place]

## 2022-11-21 DIAGNOSIS — N1831 Chronic kidney disease, stage 3a: Secondary | ICD-10-CM | POA: Diagnosis not present

## 2022-11-21 DIAGNOSIS — I129 Hypertensive chronic kidney disease with stage 1 through stage 4 chronic kidney disease, or unspecified chronic kidney disease: Secondary | ICD-10-CM | POA: Diagnosis not present

## 2022-11-21 DIAGNOSIS — K146 Glossodynia: Secondary | ICD-10-CM | POA: Diagnosis not present

## 2022-11-21 DIAGNOSIS — J449 Chronic obstructive pulmonary disease, unspecified: Secondary | ICD-10-CM | POA: Diagnosis not present

## 2022-11-21 DIAGNOSIS — K117 Disturbances of salivary secretion: Secondary | ICD-10-CM | POA: Diagnosis not present

## 2022-11-22 ENCOUNTER — Institutional Professional Consult (permissible substitution): Payer: Medicare PPO | Admitting: Neurology

## 2022-11-28 ENCOUNTER — Telehealth: Payer: Self-pay | Admitting: Family Medicine

## 2022-11-28 NOTE — Telephone Encounter (Signed)
Please call pt back. [name] Lomax,NP previously wrote that he could come in sooner for appt if sx worsened. Please offer 12/20/22 at 230pm with Dr. Brett Fairy.

## 2022-11-28 NOTE — Telephone Encounter (Signed)
Pt would like a call from RN to discuss the Tremor in hand worsening

## 2022-11-28 NOTE — Telephone Encounter (Signed)
Verified pt scheduled 12/21/22 for appt w/ Dr. Brett Fairy.

## 2022-11-28 NOTE — Telephone Encounter (Signed)
Called pt and he was not able to accept 4/2 but is able to come in on 4/3. Pt was added to the waitlist as well.

## 2022-11-29 DIAGNOSIS — G4733 Obstructive sleep apnea (adult) (pediatric): Secondary | ICD-10-CM | POA: Diagnosis not present

## 2022-11-29 DIAGNOSIS — F3181 Bipolar II disorder: Secondary | ICD-10-CM | POA: Diagnosis not present

## 2022-11-29 DIAGNOSIS — F5105 Insomnia due to other mental disorder: Secondary | ICD-10-CM | POA: Diagnosis not present

## 2022-12-13 DIAGNOSIS — N1831 Chronic kidney disease, stage 3a: Secondary | ICD-10-CM | POA: Diagnosis not present

## 2022-12-13 DIAGNOSIS — J449 Chronic obstructive pulmonary disease, unspecified: Secondary | ICD-10-CM | POA: Diagnosis not present

## 2022-12-13 DIAGNOSIS — R269 Unspecified abnormalities of gait and mobility: Secondary | ICD-10-CM | POA: Diagnosis not present

## 2022-12-13 DIAGNOSIS — I129 Hypertensive chronic kidney disease with stage 1 through stage 4 chronic kidney disease, or unspecified chronic kidney disease: Secondary | ICD-10-CM | POA: Diagnosis not present

## 2022-12-13 DIAGNOSIS — I951 Orthostatic hypotension: Secondary | ICD-10-CM | POA: Diagnosis not present

## 2022-12-13 DIAGNOSIS — R42 Dizziness and giddiness: Secondary | ICD-10-CM | POA: Diagnosis not present

## 2022-12-13 DIAGNOSIS — R531 Weakness: Secondary | ICD-10-CM | POA: Diagnosis not present

## 2022-12-13 DIAGNOSIS — G4730 Sleep apnea, unspecified: Secondary | ICD-10-CM | POA: Diagnosis not present

## 2022-12-19 DIAGNOSIS — D0359 Melanoma in situ of other part of trunk: Secondary | ICD-10-CM | POA: Diagnosis not present

## 2022-12-20 ENCOUNTER — Other Ambulatory Visit: Payer: Self-pay

## 2022-12-20 ENCOUNTER — Ambulatory Visit: Payer: Medicare PPO | Admitting: Allergy and Immunology

## 2022-12-20 ENCOUNTER — Other Ambulatory Visit: Payer: Self-pay | Admitting: Allergy and Immunology

## 2022-12-20 VITALS — BP 110/64 | HR 75 | Temp 98.3°F | Resp 16 | Ht 67.0 in | Wt 197.0 lb

## 2022-12-20 DIAGNOSIS — J3089 Other allergic rhinitis: Secondary | ICD-10-CM | POA: Diagnosis not present

## 2022-12-20 DIAGNOSIS — K149 Disease of tongue, unspecified: Secondary | ICD-10-CM | POA: Diagnosis not present

## 2022-12-20 DIAGNOSIS — J4550 Severe persistent asthma, uncomplicated: Secondary | ICD-10-CM | POA: Diagnosis not present

## 2022-12-20 DIAGNOSIS — N320 Bladder-neck obstruction: Secondary | ICD-10-CM

## 2022-12-20 DIAGNOSIS — K219 Gastro-esophageal reflux disease without esophagitis: Secondary | ICD-10-CM

## 2022-12-20 DIAGNOSIS — D0359 Melanoma in situ of other part of trunk: Secondary | ICD-10-CM | POA: Diagnosis not present

## 2022-12-20 DIAGNOSIS — L570 Actinic keratosis: Secondary | ICD-10-CM | POA: Diagnosis not present

## 2022-12-20 DIAGNOSIS — J301 Allergic rhinitis due to pollen: Secondary | ICD-10-CM

## 2022-12-20 MED ORDER — BUDESONIDE-FORMOTEROL FUMARATE 160-4.5 MCG/ACT IN AERO: 2.0000 | INHALATION_SPRAY | Freq: Two times a day (BID) | RESPIRATORY_TRACT | 5 refills | Status: DC

## 2022-12-20 MED ORDER — ALBUTEROL SULFATE (2.5 MG/3ML) 0.083% IN NEBU: 2.5000 mg | INHALATION_SOLUTION | Freq: Four times a day (QID) | RESPIRATORY_TRACT | 12 refills | Status: AC | PRN

## 2022-12-20 MED ORDER — IPRATROPIUM-ALBUTEROL 0.5-2.5 (3) MG/3ML IN SOLN: 3.0000 mL | Freq: Four times a day (QID) | RESPIRATORY_TRACT | 1 refills | Status: DC | PRN

## 2022-12-20 MED ORDER — BUDESONIDE 0.25 MG/2ML IN SUSP: RESPIRATORY_TRACT | 1 refills | Status: DC

## 2022-12-20 MED ORDER — ESOMEPRAZOLE MAGNESIUM 20 MG PO CPDR: 20.0000 mg | DELAYED_RELEASE_CAPSULE | Freq: Every day | ORAL | 1 refills | Status: DC

## 2022-12-20 MED ORDER — OPTICHAMBER DIAMOND MISC: 1.0000 | Freq: Every day | 1 refills | Status: DC

## 2022-12-20 MED ORDER — MONTELUKAST SODIUM 10 MG PO TABS: 10.0000 mg | ORAL_TABLET | Freq: Every day | ORAL | 1 refills | Status: DC

## 2022-12-20 MED ORDER — ALBUTEROL SULFATE HFA 108 (90 BASE) MCG/ACT IN AERS: 2.0000 | INHALATION_SPRAY | RESPIRATORY_TRACT | 1 refills | Status: DC | PRN

## 2022-12-20 NOTE — Progress Notes (Unsigned)
[place]   Follow-up Note  Referring Provider: Burnard Bunting, MD Primary Provider: Burnard Bunting, MD Date of Office Visit: 12/20/2022  Subjective:   Jared [name] (DOB: 05/05/1951) is a 72 y.o. male who returns to the [facility] on 12/20/2022 in re-evaluation of the following:  HPI: [name] returns to this clinic in reevaluation of asthma, allergic rhinitis, LPR.  His last visit with me was 19 April 2022.  He did visit with Dr. Posey Pronto on 23 August 2022.  At this point in time he is doing relatively well with his asthma and does not need to use a short acting bronchodilator.  He stopped his Judithann Sauger because he thought it was giving rise to a very weak stream of urine for the past month but he has been off this medicine for about a month or so and his urine problem has not changed.  For some reason he did not receive a biologic agent for the past several months once his Xolair was discontinued during his last visit with Dr. Posey Pronto.  It should be noted that he required a systemic steroid at the tail end of November 2023 and required a systemic steroid and prednisone at the tail end of February 2024 for an asthma exacerbation that was accompanied by upper respiratory tract infection.  He believes that his reflux is under very good control.  He describes this "saliva" problem in his mouth.  He is always sucking on his tongue although he does not really swallow much saliva.  He does not have any saliva coming out of his mouth.  There is just a tongue sensation where he has to suck on his tongue.  He does not have a history of dry mouth or dry eye.  This issue has been present for a month or so.  There have not been any new medications administered that correlates with the onset of this issue.  He has an appointment to see his urologist in April.  He has an appointment to see his neurologist in April.  Allergies as of 12/20/2022        Reactions   Contrast Media [iodinated Contrast Media] Other (See Comments)   Was told not to take d/t pt only having 1 kidney   Nsaids Other (See Comments)   Was told not to take d/t pt only having 1 kidney   Penicillins Hives, Itching, Other (See Comments), Nausea And Vomiting   Tolerates Cefepime Has patient had a PCN reaction causing immediate rash, facial/tongue/throat swelling, SOB or lightheadedness with hypotension: Unknown Has patient had a PCN reaction causing severe rash involving mucus membranes or skin necrosis: Unknown Has patient had a PCN reaction that required hospitalization: Unknown Has patient had a PCN reaction occurring within the last 10 years: No If all of the above answers are "NO", then may proceed with Cephalosporin use. Has patient had a PCN reaction causing immediate rash, facial/tongue/throat swelling, SOB or lightheadedness with hypotension: Unknown, Has patient had a PCN reaction causing severe rash involving mucus membranes or skin necrosis: Unknown, Has patient had a PCN reaction that required hospitalization: Unknown, Has patient had a PCN reaction occurring within the last 10 years: No, If all of the above answers are "NO", then may proceed with Cephalosporin use.   Misc. Sulfonamide Containing Compounds Hives   Codeine Nausea Only, Nausea And Vomiting   Penicillamine Rash   Pholcodine Rash   Statins Other (See Comments)  Myalgias   Sulfonamide Derivatives Rash        Medication List      albuterol (2.5 MG/3ML) 0.083% nebulizer solution Commonly known as: PROVENTIL Take 3 mLs (2.5 mg total) by nebulization every 6 (six) hours as needed for wheezing or shortness of breath.   albuterol 108 (90 Base) MCG/ACT inhaler Commonly known as: VENTOLIN HFA Inhale 2 puffs into the lungs every 4 (four) hours as needed for wheezing or shortness of breath. Can inhale two puffs every four to six hours as needed for cough, wheeze, shortness of breath, or  chest tightness.   ascorbic acid 500 MG tablet Commonly known as: VITAMIN C Take 500 mg by mouth 2 (two) times daily.   aspirin EC 325 MG tablet Take 325 mg by mouth daily.   betamethasone dipropionate 0.05 % cream Apply 1 application  topically daily as needed (dry skin).   DULoxetine 60 MG capsule Commonly known as: CYMBALTA Take 60 mg by mouth every morning.   ergocalciferol 1.25 MG (50000 UT) capsule Commonly known as: VITAMIN D2 Take 50,000 Units by mouth once a week. Saturday   esomeprazole 20 MG capsule Commonly known as: NEXIUM Take 1 capsule (20 mg total) by mouth daily.   ferrous sulfate 325 (65 FE) MG tablet Take 1 tablet (325 mg total) by mouth daily with breakfast.   furosemide 20 MG tablet Commonly known as: LASIX Take 20 mg by mouth daily as needed for edema.   hyoscyamine 0.125 MG tablet Commonly known as: LEVSIN Take 1 tablet (0.125 mg total) by mouth every 6 (six) hours as needed for cramping.   isosorbide mononitrate 30 MG 24 hr tablet Commonly known as: IMDUR take 1 tablet by mouth once daily   ketoconazole 2 % cream Commonly known as: NIZORAL Apply topically daily.   levothyroxine 50 MCG tablet Commonly known as: SYNTHROID Take 50 mcg by mouth daily before breakfast.   levothyroxine 25 MCG tablet Commonly known as: SYNTHROID Take 25 mcg by mouth every morning.   metroNIDAZOLE 0.75 % cream Commonly known as: METROCREAM Apply topically daily.   montelukast 10 MG tablet Commonly known as: SINGULAIR Take 1 tablet (10 mg total) by mouth at bedtime.   multivitamin tablet Take 1 tablet by mouth at bedtime.   ondansetron 4 MG tablet Commonly known as: ZOFRAN Take 4 mg by mouth daily as needed for nausea/vomiting.   optichamber diamond Misc 1 each by Other route daily. Use as directed with inhaler.   oxyCODONE 5 MG immediate release tablet Commonly known as: Oxy IR/ROXICODONE Take 1 tablet (5 mg total) by mouth every 4 (four) hours as  needed for moderate pain.   potassium chloride 10 MEQ tablet Commonly known as: KLOR-CON M Take 10 mEq by mouth daily as needed (low levels).   Repatha SureClick [id number] MG/ML [facility] Generic drug: Evolocumab Inject 1 Pen into the skin every 14 (fourteen) days.   simethicone 80 MG chewable tablet Commonly known as: Gas-X Chew 1 tablet (80 mg total) by mouth every 6 (six) hours as needed for flatulence.   telmisartan 20 MG tablet Commonly known as: MICARDIS Take 20 mg by mouth daily.   testosterone cypionate 200 MG/ML injection Commonly known as: DEPOTESTOSTERONE CYPIONATE Inject 200 mg into the muscle every 30 (thirty) days.    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;  Service: Neurosurgery;  Laterality: N/A;  Cervical six-seven anterior cervical decompression fusion with removal of hardware at Cervical five-six   BACK SURGERY     1992, 2013 lower back    CARDIAC CATHETERIZATION  05/10/2007   Minimal CAD, normal LV systolic function, medical management   CARDIAC CATHETERIZATION  04/08/2008   RCA ulcerated 70-80% stenosis, stented with a 3x69mm Endeavor stent at 13atm for 50sec, reduced from 80% ulcerated stenosis to 0%.   CARDIAC CATHETERIZATION  05/07/2009   50% distal left main disease-IVUS or flow wire too dangerous in particular setting to perform intervention.   CARDIAC CATHETERIZATION  03/18/2010   Medical management   CARDIOVASCULAR STRESS TEST  02/09/2012   Normal, no significant wall abnormalities noted   CARPAL TUNNEL RELEASE Bilateral 2000   COLONOSCOPY     COLONOSCOPY W/ BIOPSIES AND POLYPECTOMY     COLONOSCOPY WITH PROPOFOL  06/01/2021   [facility] [place]     as infant groin   NECK SURGERY  2005   NEPHRECTOMY Left 1997   secondary to cancer   TOTAL KNEE ARTHROPLASTY Left 2005   TOTAL KNEE ARTHROPLASTY Right 04/13/2018   Procedure: RIGHT TOTAL KNEE ARTHROPLASTY;  Surgeon: Netta Cedars, MD;  Location: [facility];  Service: Orthopedics;  Laterality: Right;   TRANSURETHRAL RESECTION OF PROSTATE N/A 03/25/2021   Procedure: TRANSURETHRAL RESECTION OF THE PROSTATE (TURP);  Surgeon: Franchot Gallo, MD;  Location: [facility];  Service: Urology;  Laterality: N/A;    Review of systems negative except as noted in HPI / PMHx or noted below:  Review of Systems  Constitutional: Negative.   HENT: Negative.    Eyes: Negative.   Respiratory: Negative.    Cardiovascular: Negative.   Gastrointestinal: Negative.   Genitourinary: Negative.   Musculoskeletal: Negative.   Skin: Negative.   Neurological: Negative.   Endo/Heme/Allergies: Negative.   Psychiatric/Behavioral: Negative.       Objective:   Vitals:   12/20/22 0841  BP: 110/64  Pulse: 75  Resp: 16  Temp: 98.3 F (36.8 C)  SpO2: 96%   Height: 5\' 7"  (170.2 cm)   Weight: 197 lb (89.4 kg)   Physical Exam Constitutional:      Appearance: He is not diaphoretic.  HENT:     Head: Normocephalic.     Right Ear: Tympanic membrane, ear canal and external ear normal.  Left Ear: Tympanic membrane, ear canal and external ear normal.     Nose: Nose normal. No mucosal edema or rhinorrhea.     Mouth/Throat:     Mouth: Mucous membranes are dry.     Pharynx: Uvula midline. No oropharyngeal exudate.  Eyes:     Conjunctiva/sclera: Conjunctivae normal.  Neck:     Thyroid: No thyromegaly.     Trachea: Trachea normal. No tracheal tenderness or tracheal deviation.  Cardiovascular:     Rate and Rhythm: Normal rate and regular rhythm.     Heart sounds: Normal heart sounds, S1 normal and S2 normal. No murmur heard. Pulmonary:     Effort: No respiratory distress.     Breath sounds: Normal breath sounds. No stridor. No wheezing or rales.  Lymphadenopathy:     Head:     Right side of head: No tonsillar adenopathy.     Left side of head: No tonsillar adenopathy.     Cervical: No cervical adenopathy.  Skin:    Findings: No erythema or rash.     Nails: There is no clubbing.  Neurological:     Mental Status: He is alert.     Diagnostics:    Spirometry was performed and demonstrated an FEV1 of 2.32 at 83 % of predicted.  Results of blood tests obtained 31 August 2022 identified WBC 9.4, absolute eosinophil 400, absolute lymphocyte 1800, hemoglobin 14.1, platelet 286  Assessment and Plan:   1. Not well controlled severe persistent asthma   2. Perennial allergic rhinitis   3. Seasonal allergic rhinitis due to pollen   4. LPRD (laryngopharyngeal reflux disease)   5. Bladder outlet obstruction   6. Tongue disorder     1.  Start mepolizumab injections - Tammy  2.  Start symbicort 160 - 2 inhalations 2 times per day w/spacer (empty lungs)  3. Continue Singulair 10 mg - 1 tablet 1 time per day  4. Continue Nexium 40 mg - 1 tablet 1 time per day  5.  Continue albuterol HFA or albuterol nebulization if needed  6. Discuss bladder outlet obstruction with urologist  7. Discuss tongue sensation with neurologist  8. Return to clinic summer 2024 or earlier if problem  [place] is doing pretty well at this point in time but that is because he received a systemic steroid about a month ago and he has required systemic steroids about every 3 months for his asthma.  We need to get him on a biologic agent.  I am going to keep him off any anticholinergic agents such as found in [place] because of his bladder outlet issue and he needs to contact his urologist about that issue.  As well, he has this tongue sensation where he describes saliva in his mouth but really his mouth is very dry and he may be developing some neurological issue as he does have a collection of neurological issues evaluated by neurology and I have asked him to inform his neurologist about this issue.  The approach to this issue may be as simple as him using a Biotene mouthwash to address dry mouth but he needs to have neurology address this issue initially.  It is interesting to note that both his bladder problem and his tongue sensation developed during the same timeframe.  I will see him back in this clinic in the summer 2024 or earlier if there is a problem.  Allena Katz, MD Allergy / Immunology [place]

## 2022-12-20 NOTE — Patient Instructions (Addendum)
  1.  Start mepolizumab injections - Tammy  2.  Start symbicort 160 - 2 inhalations 2 times per day w/spacer (empty lungs)  3. Continue Singulair 10 mg - 1 tablet 1 time per day  4. Continue Nexium 40 mg - 1 tablet 1 time per day  5. Continue albuterol HFA or albuterol nebulization if needed  6. Discuss bladder outlet obstruction with urologist  7. Discuss tongue sensation with neurologist  8. Return to clinic summer 2024 or earlier if problem

## 2022-12-21 ENCOUNTER — Encounter: Payer: Self-pay | Admitting: Allergy and Immunology

## 2022-12-21 ENCOUNTER — Ambulatory Visit: Payer: Medicare PPO | Admitting: Neurology

## 2022-12-21 ENCOUNTER — Telehealth: Payer: Self-pay | Admitting: *Deleted

## 2022-12-21 ENCOUNTER — Encounter: Payer: Self-pay | Admitting: Neurology

## 2022-12-21 VITALS — BP 124/65 | HR 82 | Ht 67.0 in | Wt 199.0 lb

## 2022-12-21 DIAGNOSIS — G2119 Other drug induced secondary parkinsonism: Secondary | ICD-10-CM | POA: Diagnosis not present

## 2022-12-21 DIAGNOSIS — R251 Tremor, unspecified: Secondary | ICD-10-CM

## 2022-12-21 DIAGNOSIS — K117 Disturbances of salivary secretion: Secondary | ICD-10-CM

## 2022-12-21 DIAGNOSIS — R2689 Other abnormalities of gait and mobility: Secondary | ICD-10-CM

## 2022-12-21 DIAGNOSIS — M6289 Other specified disorders of muscle: Secondary | ICD-10-CM | POA: Insufficient documentation

## 2022-12-21 MED ORDER — TETRABENAZINE 12.5 MG PO TABS: 12.5000 mg | ORAL_TABLET | Freq: Every day | ORAL | 5 refills | Status: DC

## 2022-12-21 NOTE — Progress Notes (Signed)
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 Movement disorder.    referred by PCP but having worsening in symptoms that he has been having going on for some time. 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 He has been having increase in saliva and sucking on tongue. He also has had worsening with gait and shuffling gait and cramping in his lower extremity.       Other Pt was evaluated in January for his CPAP machine by Wendi Maya, NP . CPAP  is working well [facility]              HISTORY OF PRESENT ILLNESS:  Jared [name] is a 72 y.o. male patient who is here for revisit 12/21/2022 for  tremors/ Parkinsonism.  .  Chief concern according to patient :   12-21-2022: patient is happy with CPAP and highly compliant having a residual AHI of 0.9/h.  He is here to address a worsening in parkinsonian symptoms, he has good days and bad days in tremor - last visit with amy Lomax,np none was noted. He has  usually more tremor on his non-dominant  left side than right side but I see the opposite today. He was weaned off Abilify in January as reported by Debbora Presto, Np but now takes again 5 mg a day.  A tremogram was drawn today, documenting more tremor with writing on the left. He has noted some masked face, drooling is relatively new, he reports no side difference, no oral drop.  He understands that these are all known side effects of psychotropic medications.  He denies having been on Haldol, Seroquel, Thorazine.     Jared [name] is a 72 y.o. year old White or Caucasian male patient seen for a sleep consultation- the  patient's primary neurologist is Dr Jared [name].  referral on 08/28/2019 .   Chief concern according to patient :  I am getting more and more fatigued, CPAP machine is 74 years old.    I have the pleasure of seeing Jared [name] today, a right-handed White or Caucasian male with a possible sleep disorder.  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  Patient reports headaches have improved, sleepiness is progressing.  Dr Jared [name] started w treatment of severe depression with Cymbalta. Once a day and he sleeps better when taking Hydralizine and Belsomra with Metta Clines.      Jared [name] was seen for a baseline diagnostic polysomnography on January 14, 2014 referred by Dr. Burnard Bunting and at the time less sleepy than today with the Epworth Sleepiness Scale endorsed at 9 out of 24 points.  Sleep efficiency was poor 70.6% and highly fragmented.  The patient had multiple hypopneas rather than apneas 66 respiratory events consisted of 55 shallow breathing spells 1 obstructive apnea and 4 central apneas and 6 mixed apneas with an AHI of 12.9.  The lowest oxygen saturation was 77% at nadir with 41 minutes of desaturation in total.  He also had frequent periodic limb movements at the time he also moved during REM sleep indicative of REM sleep behavior disorder.  He returned for CPAP titration on 06 Feb 2014 his AHI was reduced significantly and his periodic limb  movements also improved.  Nadir was now 82% with only 2 minutes of desaturation time.  He started on 10 cmH2O pressure with 3 cm flex or air sense and a nasal pillow.  Normal sinus rhythm by EKG was no injury was noted.   Dr. Jannifer [name]:  08-10-2019 History of present illness:   Jared [name] is a 72 year old right-handed white male with a history of renal cell carcinoma in the past, history of coronary artery disease, asthma, sleep apnea on CPAP, and history of depression on medications to include Abilify.  The patient claims that in early August he began having headaches in the right frontotemporal region.  He has never had any problems with headaches at any time in his life, headaches are very unusual for him.  The patient has noted that the headaches may last 30 minutes to an hour and a half, and may come and go up to 3 times a day, sometimes waking him up out of sleep.  The patient has no nausea, vomiting or photophobia or phonophobia with the headache.  He denies any vision changes or neck pain.  He may take Tylenol with good improvement of the headache.  The headaches are usually a 5 or 6 out of 10 on a pain scale and are described as a dull achy pain.  The patient reports no sinus drainage or allergy symptoms.  He denies any numbness or weakness of the face, arms, legs.  He has noted recently some problems with tremor that will come and go involving the left upper extremity.  He denies issues controlling the bowels or the bladder.  He was sent to this office for an evaluation.  He recently had MRI of the brain that was relatively unremarkable, no source of the headache was seen.  The headaches are always on the right frontal temporal area, never anywhere else.  He does not get any redness of the eye or tearing of the eye with a headache.  The patient was placed on Lamisil around the time of onset of headache, but he ran out of his medication a week ago and he has continued to have headaches.    Review of  Systems: Out of a complete 14 system review, the patient complains of only the following symptoms, and all other reviewed systems are negative.:  Fatigue, sleepiness , drooling, tremor.    How likely are you to doze in the following situations: 0 = not likely, 1 = slight chance, 2 = moderate chance, 3 = high chance   Sitting and Reading? Watching Television? Sitting inactive in a public place (theater or meeting)? As a passenger in a car for an hour without a break? Lying down in the afternoon when circumstances permit? Sitting and talking to someone? Sitting quietly after lunch without alcohol? In a car, while stopped for a few minutes in traffic?   Total = 7/ 24 points   FSS endorsed at XXX/ 63 points.  GDS 6/  15   Drooling, shuffling, tremor.   History of anemia.   History of depression , major.  NP Lance Morin- [facility] is his psychiatry provider.  Social History   Socioeconomic History   Marital status: Married    Spouse name: Not on file   Number of children: 1   Years of education: 14   Highest education level: Not on file  Occupational History   Occupation: OWNER. Landscape Design/horticulture    Employer: [employer]   Occupation: OWNER    Employer: [employer]    Comment: retired  Tobacco Use   Smoking status: Never   Smokeless tobacco: Never  Vaping Use   Vaping Use: Never used  Substance and Sexual Activity   Alcohol use: Not Currently   Drug use: No   Sexual activity: Not Currently  Other Topics Concern   Not on file  Social History Narrative   Patient is married [name]).   Patient drinks one cup of coffee but not everyday.   Patient has one child.   Patient has a college education.   Patient is right-handed.   Social Determinants of Health   Financial Resource Strain: Not on file  Food Insecurity: Not on file  Transportation Needs: Not on file  Physical Activity: Not on file  Stress: Not on file  Social Connections: Not on  file    Family History  Problem Relation Age of Onset   Asthma Mother    Heart disease Mother    Hypertension Mother    Thyroid disease Mother    Esophageal cancer Father    Barrett's esophagus Father    Bone cancer Father        mets from esophagus   Heart disease Father    Cancer Father    Depression Father    Anxiety disorder Father    Alcoholism Father    Obesity Father    Diabetes Sister    Cancer Sister        Cervical cancer   Colon cancer Maternal Uncle    Heart disease Paternal Grandfather    Heart attack Paternal Grandfather 20   Stomach cancer Neg Hx    Colon polyps Neg Hx    Rectal cancer Neg Hx     Past Medical History:  Diagnosis Date   Anxiety    Asthma    Cervical vertebral fusion 05/02/2014   Now presenting with C5-6 radiculopathy   Coronary artery disease    COVID 01/2020   pain all over monoclonal antibodies given all symptoms resolved   GERD (gastroesophageal reflux disease)    H/O heart artery stent 2000   to right rca   Heart attack 2009   History of depression 03/19/2021   History of esophageal stricture    History of spinal fracture 2015   Hyperlipidemia    Hypertension    Hypothyroidism    Neuroleptic induced parkinsonism    left arm tremor   Osteoarthritis    lower back   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   Renal cell carcinoma 1997   Left kidney   Sleep apnea    wears CPAP   Sleep talking    Snoring 12/25/2013   Wears glasses     Past Surgical History:  Procedure Laterality Date   ANTERIOR CERVICAL DECOMP/DISCECTOMY FUSION N/A 08/27/2014   Procedure: Cervical six-seven anterior cervical decompression fusion with removal of hardware at Cervical five-six;  Surgeon: Erline Levine, MD;  Location: [facility];  Service: Neurosurgery;  Laterality: N/A;  Cervical six-seven anterior cervical decompression fusion with removal of hardware at Cervical five-six   BACK SURGERY     1992, 2013 lower back   CARDIAC  CATHETERIZATION  05/10/2007   Minimal CAD, normal LV systolic function, medical management   CARDIAC CATHETERIZATION  04/08/2008   RCA ulcerated 70-80% stenosis, stented with a 3x44mm Endeavor stent at 13atm for 50sec, reduced from 80% ulcerated stenosis to 0%.   CARDIAC CATHETERIZATION  05/07/2009   50% distal left main disease-IVUS or flow wire too dangerous in particular setting to perform intervention.   CARDIAC CATHETERIZATION  03/18/2010   Medical management   CARDIOVASCULAR STRESS TEST  02/09/2012   Normal, no significant wall abnormalities noted   CARPAL TUNNEL RELEASE Bilateral 2000   COLONOSCOPY     COLONOSCOPY W/ BIOPSIES AND POLYPECTOMY     COLONOSCOPY WITH PROPOFOL  06/01/2021   [facility]     as infant groin   NECK SURGERY  2005   NEPHRECTOMY Left 1997   secondary to cancer   TOTAL KNEE ARTHROPLASTY Left 2005   TOTAL KNEE ARTHROPLASTY Right 04/13/2018   Procedure: RIGHT TOTAL KNEE ARTHROPLASTY;  Surgeon: Netta Cedars, MD;  Location: [facility];  Service: Orthopedics;  Laterality: Right;   TRANSURETHRAL RESECTION OF PROSTATE N/A 03/25/2021   Procedure: TRANSURETHRAL RESECTION OF THE PROSTATE (TURP);  Surgeon: Franchot Gallo, MD;  Location[facility];  Service: Urology;  Laterality: N/A;     Current Outpatient Medications on File Prior to Visit  Medication Sig Dispense Refill   albuterol (PROVENTIL) (2.5 MG/3ML) 0.083% nebulizer solution Take 3 mLs (2.5 mg total) by nebulization every 6 (six) hours as needed for wheezing or shortness of breath. 75 mL 12   albuterol (VENTOLIN HFA) 108 (90 Base) MCG/ACT inhaler Inhale 2 puffs into the lungs every 4 (four) hours as needed for wheezing or shortness of breath. Can inhale two puffs every four to six hours as needed for cough, wheeze, shortness of breath, or chest tightness. 18 g 1   ARIPiprazole (ABILIFY) 5 MG tablet  Take 5 mg by mouth daily.     aspirin 325 MG EC tablet Take 325 mg by mouth daily.     budesonide (PULMICORT) 0.25 MG/2ML nebulizer solution USE FOR RESPIRATORY ILLNESS OR FLARE UP TWICE DAILY FOR 1-2 WEEKS 180 mL 1   budesonide-formoterol (SYMBICORT) 160-4.5 MCG/ACT inhaler Inhale 2 puffs into the lungs 2 (two) times daily. 10.2 g 5   DULoxetine (CYMBALTA) 60 MG capsule Take 60 mg by mouth every morning.     ergocalciferol (VITAMIN D2) 1.25 MG (50000 UT) capsule Take 50,000 Units by mouth once a week. Saturday     esomeprazole (NEXIUM) 20 MG capsule Take 1 capsule (20 mg total) by mouth daily. 90 capsule 1   Evolocumab (REPATHA SURECLICK) [id number] MG/ML SOAJ Inject 1 Pen into the skin every 14 (fourteen) days. 2 mL 11   isosorbide mononitrate (IMDUR) 30 MG 24 hr tablet take 1 tablet by mouth once daily (Patient taking differently: at bedtime.) 15 tablet 0   levothyroxine (SYNTHROID) 50 MCG tablet Take 50 mcg by mouth daily before breakfast.     montelukast (SINGULAIR) 10 MG tablet Take 1 tablet (10 mg total) by mouth at bedtime. 90 tablet 1   Multiple Vitamin (MULTIVITAMIN) tablet Take 1 tablet by mouth at bedtime.      potassium  chloride (KLOR-CON) 10 MEQ tablet Take 10 mEq by mouth daily as needed (low levels).     Spacer/Aero-Holding Chambers [facility] DIAMOND) MISC 1 each by Other route daily. Use as directed with inhaler. 1 each 1   telmisartan (MICARDIS) 20 MG tablet Take 20 mg by mouth daily.     traZODone (DESYREL) 50 MG tablet Take 50 mg by mouth at bedtime.     vitamin C (ASCORBIC ACID) 500 MG tablet Take 500 mg by mouth 2 (two) times daily.      ipratropium-albuterol (DUONEB) 0.5-2.5 (3) MG/3ML SOLN Take 3 mLs by nebulization every 6 (six) hours as needed (wheezing, coughing, shortness of breath). 360 mL 1   Current Facility-Administered Medications on File Prior to Visit  Medication Dose Route Frequency Provider Last Rate Last Admin   omalizumab [name]) injection 150 mg  150 mg  Subcutaneous Q28 days Dara Hoyer, FNP   150 mg at 06/20/22 1503   omalizumab [name]) prefilled syringe 150 mg  150 mg Subcutaneous Q28 days Larose Kells, MD   150 mg at 08/23/22 1113    Allergies  Allergen Reactions   Contrast Media [Iodinated Contrast Media] Other (See Comments)    Was told not to take d/t pt only having 1 kidney   Nsaids Other (See Comments)    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  Has patient had a PCN reaction causing immediate rash, facial/tongue/throat swelling, SOB or lightheadedness with hypotension: Unknown, Has patient had a PCN reaction causing severe rash involving mucus membranes or skin necrosis: Unknown, Has patient had a PCN reaction that required hospitalization: Unknown, Has patient had a PCN reaction occurring within the last 10 years: No, If all of the above answers are "NO", then may proceed with Cephalosporin use.   Misc. Sulfonamide Containing Compounds Hives   Codeine Nausea Only and Nausea And Vomiting   Penicillamine Rash   Pholcodine Rash   Statins Other (See Comments)    Myalgias   Sulfonamide Derivatives Rash     DIAGNOSTIC DATA (LABS, IMAGING, TESTING) - I reviewed patient records, labs, notes, testing and imaging myself where available.  Lab Results  Component Value Date   WBC 9.4 08/31/2022   HGB 14.1 08/31/2022   HCT 42.9 08/31/2022   MCV 81.0 08/31/2022   PLT  286.0 08/31/2022      Component Value Date/Time   NA 141 03/03/2022 0346   NA 139 02/02/2021 1127   K 4.1 03/03/2022 0346   CL 107 03/03/2022 0346   CO2 27 03/03/2022 0346   GLUCOSE 97 03/03/2022 0346   BUN 19 03/03/2022 0346   BUN 27 02/02/2021 1127   CREATININE 1.25 (H) 03/03/2022 0346   CREATININE 1.41 (H) 06/25/2013 1051   CALCIUM 8.8 (L) 03/03/2022 0346   PROT 7.0 03/02/2022 0342   PROT 7.4 05/14/2021 1158   ALBUMIN 3.3 (L) 03/02/2022 0342   ALBUMIN 4.4 05/14/2021 1158   AST  14 (L) 03/02/2022 0342   ALT 17 03/02/2022 0342   ALKPHOS 65 03/02/2022 0342   BILITOT 0.8 03/02/2022 0342   BILITOT 0.3 05/14/2021 1158   GFRNONAA >60 03/03/2022 0346   GFRAA 56 (L) 10/28/2020 1127   Lab Results  Component Value Date   CHOL 152 05/14/2021   HDL 36 (L) 05/14/2021   LDLCALC 77 05/14/2021   TRIG 237 (H) 05/14/2021   CHOLHDL 4.2 05/14/2021   Lab Results  Component Value Date   HGBA1C 6.5 (H) 03/01/2022   Lab Results  Component Value Date   [id number] 06/02/2021   Lab Results  Component Value Date   TSH 2.775 06/02/2021    PHYSICAL EXAM:  Today's Vitals   12/21/22 1054  BP: 124/65  Pulse: 82  Weight: 199 lb (90.3 kg)  Height: 5\' 7"  (1.702 m)   Body mass index is 31.17 kg/m.   Wt Readings from Last 3 Encounters:  12/21/22 199 lb (90.3 kg)  12/20/22 197 lb (89.4 kg)  10/26/22 195 lb (88.5 kg)     Ht Readings from Last 3 Encounters:  12/21/22 5\' 7"  (1.702 m)  12/20/22 5\' 7"  (1.702 m)  10/26/22 5\' 7"  (1.702 m)      General: The patient is awake, alert and appears not in acute distress. The patient is well groomed. Head: Normocephalic, atraumatic. Neck is supple. Mallampati 2 but excess sot tissue, no tongue tremor. No h jae tremor. , neck circumference 17.5 . Cardiovascular:  Regular rate and rhythm, without  murmurs or carotid bruit, and without distended neck veins. Respiratory: Lungs are clear to auscultation. Skin:  Without evidence of edema, or  rash Trunk: BMI is  elevated and patient  has normal posture.   Neurologic exam : The patient is awake and alert, oriented to place and time.  Memory subjective  described as intact. There is a normal attention span & concentration ability. Speech is fluent with dysphonia , not  aphasia. Mood and affect are blunted - depressed. Masked face is noted.   Speech is dysphonic, he sucks  on his tongue, has too much saliva.  Cranial nerves: Pupils are equal and briskly reactive to light.  Funduscopic exam without evidence of pallor or edema.  Extraocular movements  in vertical and horizontal planes intact and without nystagmus. Visual fields by finger perimetry are intact. Hearing to finger rub intact.  Facial sensation intact to fine touch. Facial motor strength is symmetric and tongue and uvula move midline.   Motor exam: generalized increased muscle tone, cog-wheeling over right biceps, not wrist , Large amplitude  resting tremor right over left today- . Normal muscle bulk and symmetric normal strength in all extremities.   Sensory:  vibration  normal.   Coordination: Rapid alternating movements in the fingers/hands is tested and normal. Finger-to-nose maneuver tested , no  ataxia, dysmetria but a mild , low amplitude tremor with writing and at rest tremor.   Gait and station: Patient walks without assistive device , reportedly  slower than a year ago- he feels burdened, heavy.  Strength within normal limits. Stance is stable and normal. Steps are unfragmented. He reports lightheadedness when rising. Does not need to brace himself.  Lightheadedness  when turning head to the right, but does not describe vertigo, Romberg testing was  positive, he swayed but had intact righting reflexes.    Deep tendon reflexes: in the  upper and lower extremities are  Attenuated, symmetric -Babinski maneuver response is  downgoing.  ASSESSMENT AND PLAN 72 y.o. year old male  here with:    1) medication  induced secondary parkinsonism - DAT SCAN negativ , symptoms of  tremor, or shuffling gait, hypersalivation: NP Lance Morin- [facility] is his psychiatry provider.   2) OSA on CPAP is well controlled.   3) iron deficiency anemia is followed by GI, Dr Havery Moros.  4) 2 weeks ago:  had sudden onset of orthostatic dizziness and fell - the dizziness weaned off. He suffered no injuries. Was told to drink more water each day.   PLan : NP Lance Morin- [facility] is his psychiatry provider. Please have a face to face evaluation with your psychiatrist and a summary of all medications used in your psychiatric care. Patient has not recalled ever taking Reglan, Haldol, Seroquel or Thorazine- the main offenders.  He recalled Cymbalta, Prozac and Abilify .     Please consider tetrabenazine for treatment of secondary PD, also Mr Yahola does not present with tardive dyskinesia.     Follow up in sleep clinic personally or through our NP within  10 months.   CC: I will share my notes with PCP.  After spending a total time of  35  minutes face to face and additional time for physical and neurologic examination, review of laboratory studies,  personal review of imaging studies, reports and results of other testing and review of referral information / records as far as provided in visit,   Electronically signed by: Larey Seat, MD 12/21/2022 11:10 AM  [facility] [place] certified by The [employer] of Sleep Medicine and Diplomate of the [employer] of Sleep Medicine. Board certified In Neurology through the [place], Fellow of the [employer] of Neurology. 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

## 2022-12-21 NOTE — Telephone Encounter (Signed)
Called wife and advised to call [facility] [place] To have them resend docu sign to email he had filled out every thing but signature

## 2022-12-21 NOTE — Patient Instructions (Signed)
Tetrabenazine Tablets What is this medication? TETRABENAZINE (TET ra BEN a zeen) treats chorea, a condition that causes uncontrolled body movements. It works by balancing substances in your brain that help manage body movements and coordination. This medicine may be used for other purposes; ask your health care provider or pharmacist if you have questions. COMMON BRAND NAME(S): Delcie Roch What should I tell my care team before I take this medication? They need to know if you have any of these conditions: Breast cancer Heart disease Irregular heartbeat or rhythm Liver disease Mental health condition Suicidal thoughts, plans, or attempt by you or a family member An unusual or allergic reaction to tetrabenazine, other medications, foods, dyes, or preservatives Pregnant or trying to get pregnant Breast-feeding How should I use this medication? Take this medication by mouth with water. Take it as directed on the prescription label at the same time every day. You can take it with or without food. If it upsets your stomach, take it with food. Keep taking it unless your care team tells you to stop. A special MedGuide will be given to you by the pharmacist with each prescription and refill. Be sure to read this information carefully each time. Talk to your care team about the use of this medication in children. Special care may be needed. Overdosage: If you think you have taken too much of this medicine contact a poison control center or emergency room at once. NOTE: This medicine is only for you. Do not share this medicine with others. What if I miss a dose? If you miss a dose, take it as soon as you can. If it is almost time for your next dose, take only that dose. Do not take double or extra doses. What may interact with this medication? Do not take this medication with any of the following: Cisapride Dextromethorphan; quinidine Dronedarone Ketoconazole Levoketoconazole MAOIs, such as  phenelzine, tranylcypromine, isocarboxazid, rasagiline, selegiline Pimozide Quinidine Reserpine Safinamide Tetrabenazine Thioridazine Valbenazine This medication may also interact with the following: Alcohol Benzodiazepines, such as alprazolam, diazepam, or lorazepam Bupropion Certain antihistamines Certain medications for depression, such as amitriptyline, fluoxetine, paroxetine, or trazodone Certain medications for seizures, such as phenobarbital or primidone Medications for certain mental health conditions, such as aripiprazole, risperidone, ziprasidone, haloperidol, or chlorpromazine Medications that cause drowsiness before a procedure, such as propofol Medications that help you fall asleep Medications that relax muscles Metoclopramide Opioids for pain or cough Other medications that cause heart rhythm changes This list may not describe all possible interactions. Give your health care provider a list of all the medicines, herbs, non-prescription drugs, or dietary supplements you use. Also tell them if you smoke, drink alcohol, or use illegal drugs. Some items may interact with your medicine. What should I watch for while using this medication? Visit your care team for regular checks on your progress. Tell your care team if your symptoms do not start to get better or if they get worse. Watch for new or worsening thoughts of suicide or depression. This includes sudden changes in mood, behaviors, or thoughts. These changes can happen at any time but are more common in the beginning of treatment or after a change in dose. Call your care team right away if you experience these thoughts or worsening depression. This medication may affect your coordination, reaction time, or judgment. Do not drive or operate machinery until you know how this medication affects you. Sit up or stand slowly to reduce the risk of dizzy or fainting spells. Drinking  alcohol with this medication can increase the risk  of these side effects. What side effects may I notice from receiving this medication? Side effects that you should report to your care team as soon as possible: Allergic reactions--skin rash, itching, hives, swelling of the face, lips, tongue, or throat Agitation, feeling irritable or restless Heart rhythm changes--fast or irregular heartbeat, dizziness, feeling faint or lightheaded, chest pain, trouble breathing High fever, stiff muscles, increased sweating, fast or irregular heartbeat, and confusion, which may be signs of neuroleptic malignant syndrome Low blood pressure--dizziness, feeling faint or lightheaded, blurry vision Muscle stiffness or spasms, tremors or shaking, loss of balance or coordination, shuffling walk Thoughts of suicide or self-harm, worsening mood, feelings of depression Side effects that usually do not require medical attention (report these to your care team if they continue or are bothersome): Drowsiness Dry mouth Fatigue Trouble sleeping This list may not describe all possible side effects. Call your doctor for medical advice about side effects. You may report side effects to FDA at 1-800-FDA-1088. Where should I keep my medication? Keep out of the reach of children and pets. Store at room temperature at 25 degrees C (77 degrees F). Get rid of any unused medication after the expiration date. To get rid of medications that are no longer needed or have expired: Take the medication to a medication take-back program. Check with your pharmacy or law enforcement to find a location. If you cannot return the medication, check the label or package insert to see if the medication should be thrown out in the garbage or flushed down the toilet. If you are not sure, ask your care team. If it is safe to put it in the trash, empty the medication out of the container. Mix the medication with cat litter, dirt, coffee grounds, or other unwanted substance. Seal the mixture in a bag or  container. Put it in the trash. NOTE: This sheet is a summary. It may not cover all possible information. If you have questions about this medicine, talk to your doctor, pharmacist, or health care provider.  2023 Elsevier/Gold Standard (2021-12-14 00:00:00) Parkinsonism   Orthostatic Hypotension Blood pressure is a measurement of how strongly, or weakly, your circulating blood is pressing against the walls of your arteries. Orthostatic hypotension is a drop in blood pressure that can happen when you change positions, such as when you go from lying down to standing. Arteries are blood vessels that carry blood from your heart throughout your body. When blood pressure is too low, you may not get enough blood to your brain or to the rest of your organs. Orthostatic hypotension can cause light-headedness, sweating, rapid heartbeat, blurred vision, and fainting. These symptoms require further investigation into the cause. What are the causes? Orthostatic hypotension can be caused by many things, including: Sudden changes in posture, such as standing up quickly after you have been sitting or lying down. Loss of blood (anemia) or loss of body fluids (dehydration). Heart problems, neurologic problems, or hormone problems. Pregnancy. Aging. The risk for this condition increases as you get older. Severe infection (sepsis). Certain medicines, such as medicines for high blood pressure or medicines that make the body lose excess fluids (diuretics). What are the signs or symptoms? Symptoms of this condition may include: Weakness, light-headedness, or dizziness. Sweating. Blurred vision. Tiredness (fatigue). Rapid heartbeat. Fainting, in severe cases. How is this diagnosed? This condition is diagnosed based on: Your symptoms and medical history. Your blood pressure measurements. Your health care provider will  check your blood pressure when you are: Lying down. Sitting. Standing. A blood pressure  reading is recorded as two numbers, such as "120 over 80" (or 120/80). The first ("top") number is called the systolic pressure. It is a measure of the pressure in your arteries as your heart beats. The second ("bottom") number is called the diastolic pressure. It is a measure of the pressure in your arteries when your heart relaxes between beats. Blood pressure is measured in a unit called mmHg. Healthy blood pressure for most adults is 120/80 mmHg. Orthostatic hypotension is defined as a 20 mmHg drop in systolic pressure or a 10 mmHg drop in diastolic pressure within 3 minutes of standing. Other information or tests that may be used to diagnose orthostatic hypotension include: Your other vital signs, such as your heart rate and temperature. Blood tests. An electrocardiogram (ECG) or echocardiogram. A Holter monitor. This is a device you wear that records your heart rhythm continuously, usually for 24-48 hours. Tilt table test. For this test, you will be safely secured to a table that moves you from a lying position to an upright position. Your heart rhythm and blood pressure will be monitored during the test. How is this treated? This condition may be treated by: Changing your diet. This may involve eating more salt (sodium) or drinking more water. Changing the dosage of certain medicines you are taking that might be lowering your blood pressure. Correcting the underlying reason for the orthostatic hypotension. Wearing compression stockings. Taking medicines to raise your blood pressure. Avoiding actions that trigger symptoms. Follow these instructions at home: Medicines Take over-the-counter and prescription medicines only as told by your health care provider. Follow instructions from your health care provider about changing the dosage of your current medicines, if this applies. Do not stop or adjust any of your medicines on your own. Eating and drinking  Drink enough fluid to keep your  urine pale yellow. Eat extra salt only as directed. Do not add extra salt to your diet unless advised by your health care provider. Eat frequent, small meals. Avoid standing up suddenly after eating. General instructions  Get up slowly from lying down or sitting positions. This gives your blood pressure a chance to adjust. Avoid hot showers and excessive heat as directed by your health care provider. Engage in regular physical activity as directed by your health care provider. If you have compression stockings, wear them as told. Keep all follow-up visits. This is important. Contact a health care provider if: You have a fever for more than 2-3 days. You feel more thirsty than usual. You feel dizzy or weak. Get help right away if: You have chest pain. You have a fast or irregular heartbeat. You become sweaty or feel light-headed. You feel short of breath. You faint. You have any symptoms of a stroke. "BE FAST" is an easy way to remember the main warning signs of a stroke: B - Balance. Signs are dizziness, sudden trouble walking, or loss of balance. E - Eyes. Signs are trouble seeing or a sudden change in vision. F - Face. Signs are sudden weakness or numbness of the face, or the face or eyelid drooping on one side. A - Arms. Signs are weakness or numbness in an arm. This happens suddenly and usually on one side of the body. S - Speech. Signs are sudden trouble speaking, slurred speech, or trouble understanding what people say. T - Time. Time to call emergency services. Write down what time symptoms  started. You have other signs of a stroke, such as: A sudden, severe headache with no known cause. Nausea or vomiting. Seizure. These symptoms may represent a serious problem that is an emergency. Do not wait to see if the symptoms will go away. Get medical help right away. Call your local emergency services (911 in the [place] Do not drive yourself to the hospital. Summary Orthostatic  hypotension is a sudden drop in blood pressure. It can cause light-headedness, sweating, rapid heartbeat, blurred vision, and fainting. Orthostatic hypotension can be diagnosed by having your blood pressure taken while lying down, sitting, and then standing. Treatment may involve changing your diet, wearing compression stockings, sitting up slowly, adjusting your medicines, or correcting the underlying reason for the orthostatic hypotension. Get help right away if you have chest pain, a fast or irregular heartbeat, or symptoms of a stroke. This information is not intended to replace advice given to you by your health care provider. Make sure you discuss any questions you have with your health care provider. Document Revised: 11/19/2020 Document Reviewed: 11/19/2020 Elsevier Patient Education  [place].

## 2022-12-21 NOTE — Telephone Encounter (Signed)
-----   Message from Jiles Prows, MD sent at 12/21/2022  6:18 AM EDT ----- Jared [name]

## 2022-12-23 ENCOUNTER — Telehealth: Payer: Self-pay | Admitting: Neurology

## 2022-12-23 NOTE — Telephone Encounter (Signed)
Jared [name] called from [employer]. Stated pt needs PA for tetrabenazine (XENAZINE) 12.5 MG tablet.

## 2022-12-26 DIAGNOSIS — J449 Chronic obstructive pulmonary disease, unspecified: Secondary | ICD-10-CM | POA: Diagnosis not present

## 2022-12-26 DIAGNOSIS — N1831 Chronic kidney disease, stage 3a: Secondary | ICD-10-CM | POA: Diagnosis not present

## 2022-12-26 DIAGNOSIS — R413 Other amnesia: Secondary | ICD-10-CM | POA: Diagnosis not present

## 2022-12-26 DIAGNOSIS — G20A1 Parkinson's disease without dyskinesia, without mention of fluctuations: Secondary | ICD-10-CM | POA: Diagnosis not present

## 2022-12-26 DIAGNOSIS — C439 Malignant melanoma of skin, unspecified: Secondary | ICD-10-CM | POA: Diagnosis not present

## 2022-12-26 DIAGNOSIS — R269 Unspecified abnormalities of gait and mobility: Secondary | ICD-10-CM | POA: Diagnosis not present

## 2022-12-26 DIAGNOSIS — I129 Hypertensive chronic kidney disease with stage 1 through stage 4 chronic kidney disease, or unspecified chronic kidney disease: Secondary | ICD-10-CM | POA: Diagnosis not present

## 2022-12-26 DIAGNOSIS — E119 Type 2 diabetes mellitus without complications: Secondary | ICD-10-CM | POA: Diagnosis not present

## 2022-12-26 NOTE — Telephone Encounter (Signed)
PA completed on [facility] WIO:[id number] Will await determination

## 2022-12-27 DIAGNOSIS — F3181 Bipolar II disorder: Secondary | ICD-10-CM | POA: Diagnosis not present

## 2022-12-27 DIAGNOSIS — F5105 Insomnia due to other mental disorder: Secondary | ICD-10-CM | POA: Diagnosis not present

## 2022-12-27 DIAGNOSIS — G4733 Obstructive sleep apnea (adult) (pediatric): Secondary | ICD-10-CM | POA: Diagnosis not present

## 2022-12-29 ENCOUNTER — Ambulatory Visit (HOSPITAL_COMMUNITY)
Admission: RE | Admit: 2022-12-29 | Discharge: 2022-12-29 | Disposition: A | Discharge status: Home / Self Care | Payer: Medicare PPO | Source: Ambulatory Visit | Attending: Vascular Surgery | Admitting: Vascular Surgery

## 2022-12-29 ENCOUNTER — Other Ambulatory Visit (HOSPITAL_COMMUNITY): Payer: Self-pay | Admitting: Internal Medicine

## 2022-12-29 DIAGNOSIS — R252 Cramp and spasm: Secondary | ICD-10-CM

## 2022-12-29 LAB — VAS US ABI WITH/WO TBI
Left ABI: 0.98
Right ABI: 0.81

## 2023-01-02 NOTE — Telephone Encounter (Signed)
Pt called wanting to know the update on this P.A. Please advise. 

## 2023-01-02 NOTE — Telephone Encounter (Signed)
PA has been denied. 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  And is not medically accepted through medicare.

## 2023-01-03 NOTE — Telephone Encounter (Signed)
Pt returned call.  The medication that Dr. 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  Let them do evaluation then make dose adjustments.  He verbalized understanding. Appreciated call back.

## 2023-01-03 NOTE — Telephone Encounter (Signed)
Relayed to Dr. Vickey Huger, another option will be offered.

## 2023-01-03 NOTE — Telephone Encounter (Signed)
LMVM for pt to return call.  (PA denied).

## 2023-01-06 ENCOUNTER — Ambulatory Visit: Payer: Medicare PPO | Attending: Internal Medicine

## 2023-01-06 VITALS — BP 124/65

## 2023-01-06 DIAGNOSIS — R42 Dizziness and giddiness: Secondary | ICD-10-CM | POA: Diagnosis not present

## 2023-01-06 DIAGNOSIS — R2681 Unsteadiness on feet: Secondary | ICD-10-CM | POA: Insufficient documentation

## 2023-01-06 NOTE — Therapy (Signed)
OUTPATIENT PHYSICAL THERAPY VESTIBULAR EVALUATION     Patient Name: Jared [name] MRN: [id number] DOB:Jan 28, 1951, 72 y.o., male TodayCHEO SELVEY2024  END OF SESSION:  PT End of Session - 01/06/23 1103     Visit Number 1    Number of Visits 1    Authorization Type [employer] medicare    PT Start Time 1101    PT Stop Time 1145    PT Time Calculation (min) 44 min    Activity Tolerance Patient tolerated treatment well    Behavior During Therapy [facility] for tasks assessed/performed;Flat affect             Past Medical History:  Diagnosis Date   Anxiety    Asthma    Cervical vertebral fusion 05/02/2014   Now presenting with C5-6 radiculopathy   Coronary artery disease    COVID 01/2020   pain all over monoclonal antibodies given all symptoms resolved   GERD (gastroesophageal reflux disease)    H/O heart artery stent 2000   to right rca   Heart attack 2009   History of depression 03/19/2021   History of esophageal stricture    History of spinal fracture 2015   Hyperlipidemia    Hypertension    Hypothyroidism    Neuroleptic induced parkinsonism    left arm tremor   Osteoarthritis    lower back   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   Renal cell carcinoma 1997   Left kidney   Sleep apnea    wears CPAP   Sleep talking    Snoring 12/25/2013   Wears glasses    Past Surgical History:  Procedure Laterality Date   ANTERIOR CERVICAL DECOMP/DISCECTOMY FUSION N/A 08/27/2014   Procedure: Cervical six-seven anterior cervical decompression fusion with removal of hardware at Cervical five-six;  Surgeon: Maeola Harman, MD;  Location: [facility];  Service: Neurosurgery;  Laterality: N/A;  Cervical six-seven anterior cervical decompression fusion with removal of hardware at Cervical five-six   BACK SURGERY     1992, 2013 lower back   CARDIAC CATHETERIZATION  05/10/2007   Minimal CAD, normal LV systolic function, medical management   CARDIAC CATHETERIZATION   04/08/2008   RCA ulcerated 70-80% stenosis, stented with a 3x25mm Endeavor stent at 13atm for 50sec, reduced from 80% ulcerated stenosis to 0%.   CARDIAC CATHETERIZATION  05/07/2009   50% distal left main disease-IVUS or flow wire too dangerous in particular setting to perform intervention.   CARDIAC CATHETERIZATION  03/18/2010   Medical management   CARDIOVASCULAR STRESS TEST  02/09/2012   Normal, no significant wall abnormalities noted   CARPAL TUNNEL RELEASE Bilateral 2000   COLONOSCOPY     COLONOSCOPY W/ BIOPSIES AND POLYPECTOMY     COLONOSCOPY WITH PROPOFOL  06/01/2021   [facility]leene Patrick at [facility]   CORONARY STENT PLACEMENT     FEMUR FRACTURE SURGERY Left 1995   HERNIA REPAIR     as infant groin   NECK SURGERY  2005   NEPHRECTOMY Left 1997   secondary to cancer   TOTAL KNEE ARTHROPLASTY Left 2005   TOTAL KNEE ARTHROPLASTY Right 04/13/2018   Procedure: RIGHT TOTAL KNEE ARTHROPLASTY;  Surgeon: Beverely Low, MD;  Location: [facility] OR;  Service: Orthopedics;  Laterality: Right;   TRANSURETHRAL RESECTION OF PROSTATE N/A 03/25/2021   Procedure: TRANSURETHRAL RESECTION OF THE PROSTATE (TURP);  Surgeon: Marcine Matar, MD;  Location: [facility];  Service: Urology;  Laterality: N/A;   Patient Active Problem List   Diagnosis Date  Noted   Drooling 12/21/2022   Hypersalivation 12/21/2022   Muscle tone increased 12/21/2022   Shuffling gait 12/21/2022   Tremor of both hands 12/21/2022   Severe persistent asthma with acute exacerbation 08/08/2022   Chronic low back pain 04/04/2022   Lumbar spondylosis 04/04/2022   Hyperglycemia 03/01/2022   Acute diverticulitis 02/28/2022   Pain in joint of left knee 11/11/2021   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   Generalized weakness 06/02/2021   Hypokalemia, inadequate intake 06/02/2021   Hypothyroidism 06/02/2021   Chronic kidney disease, stage 3a 06/02/2021   Pain of left hip joint 05/30/2021   BPH  with obstruction/lower urinary tract symptoms 03/25/2021   Pre-diabetes 12/28/2020   Vitamin D deficiency 12/09/2020   Class 1 obesity with serious comorbidity and body mass index (BMI) of 33.0 to 33.9 in adult 12/09/2020   Viral URI 07/13/2020   AKI (acute kidney injury) 11/17/2019   COVID-19 virus infection 11/16/2019   OSA on CPAP 08/28/2019   Pain due to onychomycosis of toenails of both feet 08/27/2019   Parkinsonism 07/16/2019   Lumbago with sciatica 03/04/2019   Severe persistent asthma without complication 01/30/2019   Seasonal and perennial allergic rhinitis 01/30/2019   Trigger finger of right thumb 10/30/2018   Trigger index finger of right hand 10/30/2018   Status post total knee replacement, 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   Stress fracture of femur 03/15/2018   Other allergic rhinitis 12/21/2017   LPRD (laryngopharyngeal reflux disease) 12/21/2017   Tear of medial meniscus of knee 12/12/2017   Osteoarthritis of knee 10/03/2017   Allergic rhinoconjunctivitis 05/21/2015   Brachial neuritis 10/13/2014   Cervical radiculopathy 09/11/2014   C7 cervical fracture 08/27/2014   Cervical spine fracture 08/23/2014   Abnormal brain MRI 07/23/2014   Parkinsonian features 06/25/2014   Cervical vertebral fusion 05/02/2014   Cervical radiculitis 04/23/2014   Snoring 12/25/2013   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   Insomnia due to mental condition 12/25/2013   Sleep talking    Essential hypertension 07/08/2013   Depression, prolonged 06/26/2013   Night sweats 06/26/2013   Chest pain at rest 06/25/2013   Bronchiectasis without acute exacerbation 01/16/2008   PULMONARY NODULE 12/20/2007   HYPERNEPHROMA 11/27/2007   Mixed hyperlipidemia 11/27/2007   Coronary atherosclerosis 11/27/2007   HEMORRHOIDS, INTERNAL 11/27/2007   Moderate persistent asthma 11/27/2007   ESOPHAGITIS 11/27/2007   ESOPHAGEAL STRICTURE 11/27/2007   GERD without esophagitis 11/27/2007   GASTRITIS, ACUTE  11/27/2007   DIVERTICULOSIS, COLON 11/27/2007   ABDOMINAL PAIN, LEFT UPPER QUADRANT 11/27/2007   HIP FRACTURE, LEFT 11/27/2007   FRACTURE, TIBIA 11/27/2007   BPH (benign prostatic hyperplasia) 11/27/2007    PCP: Geoffry Paradise, MD REFERRING PROVIDER: Geoffry Paradise, MD  REFERRING DIAG: R42: dizziness and giddiness  THERAPY DIAG:  Unsteadiness on feet  Dizziness and giddiness  ONSET DATE: 01/02/23 referral  Rationale for Evaluation and Treatment: Rehabilitation  SUBJECTIVE:   SUBJECTIVE STATEMENT: Patient arrives to the clinic alone, no AD. Patient reports having "dizzy spells." Had one last night and it "nearly put him on the floor." Sometimes if he holds onto something it'll pass. These dizzy spells have caused at least 3 falls. Dizziness occurs when he is standing and walking. Dizziness will occur when he is walking around his house, regardless of head or body movement.  Pt accompanied by: self  PERTINENT HISTORY: anxiety, asthma, C-spine fusion, CAD, GERD, MI (2009), depression, HTN, HLD, hypothyroidism, neuroleptic induced PD, renal cell carcinoma, sleep apnea  PAIN:  Are you having pain? No  PRECAUTIONS: Fall  WEIGHT BEARING RESTRICTIONS: No  FALLS: Has patient fallen in last 6 months? Yes. Number of falls 3; due to feeling dizzy   LIVING ENVIRONMENT: Lives with: lives with their family  PLOF: Independent  PATIENT GOALS: "to not be dizzy"  OBJECTIVE:   COGNITION: Overall cognitive status: No family/caregiver present to determine baseline cognitive functioning   SENSATION: WFL  Cervical ROM:   Limited due to h/o C-spine fusion  STRENGTH: WFL   BED MOBILITY:  Independent, no dizziness   GAIT: Gait pattern: step through pattern, decreased arm swing- Right, decreased arm swing- Left, decreased stride length, decreased hip/knee flexion- Right, decreased hip/knee flexion- Left, shuffling, trunk flexed, and narrow BOS Distance walked: clinic Assistive  device utilized: None Level of assistance: Complete Independence  PATIENT SURVEYS:  FOTO 54, expected to be 63  VESTIBULAR ASSESSMENT:  GENERAL OBSERVATION: NAD, resting B UE tremor noted, no AD use   SYMPTOM BEHAVIOR:  Subjective history: see above  Non-Vestibular symptoms:  none  Type of dizziness: Lightheadedness/Faint  Frequency: can be multiple times a day   Duration: 30s-1 min  Aggravating factors: Induced by motion: occur when walking and Occurs when standing still   Relieving factors:  holding onto something to steady himself  Progression of symptoms: unchanged  OCULOMOTOR EXAM:  Ocular Alignment: normal  Ocular ROM: No Limitations  Spontaneous Nystagmus: absent  Gaze-Induced Nystagmus: absent  Smooth Pursuits: intact  Saccades: intact  VESTIBULAR - OCULAR REFLEX:   Slow VOR: Normal  VOR Cancellation: Normal  Head-Impulse Test: HIT Right: negative HIT Left: negative   POSITIONAL TESTING: not indicated based on symptom report  MOTION SENSITIVITY:  Motion Sensitivity Quotient Intensity: 0 = none, 1 = Lightheaded, 2 = Mild, 3 = Moderate, 4 = Severe, 5 = Vomiting  Intensity  1. Sitting to supine 0  2. Supine to L side 0  3. Supine to R side 0  4. Supine to sitting 1  5. L Hallpike-Dix Not indicated  6. Up from L  Not indicated  7. R Hallpike-Dix Not indicated  8. Up from R  Not indicated  9. Sitting, head tipped to L knee 0  10. Head up from L knee 0  11. Sitting, head tipped to R knee 0  12. Head up from R knee 0  13. Sitting head turns x5 0  14.Sitting head nods x5 0  15. In stance, 180 turn to L  0  16. In stance, 180 turn to R 0    OTHOSTATICS:     01/06/2023   11:29 AM 01/06/2023   11:26 AM 01/06/2023   11:25 AM  Vitals with BMI  Systolic 124 123 161  Diastolic 65 62 61    VESTIBULAR TREATMENT:                                                                                                   N/a eval  PATIENT EDUCATION: Education details:  PT POC, exam findings, ways to support BP  Person educated: Patient Education method: Explanation Education comprehension: verbalized  understanding  HOME EXERCISE PROGRAM:  GOALS: Not needed as PT is not indicated at this time  ASSESSMENT:  CLINICAL IMPRESSION: Patient is a 72 y.o. male who was seen today for physical therapy evaluation and treatment for dizziness. His symptom report is most consistent with orthostatic hypotension. While his orthostatic vitals were negative today, it does not definitively rule out experiencing orthostatic hypotension at other times. His vestibular exam is normal with no evidence of central or peripheral dysfunction. Discussed with patient that he should be adequately hydrated, can wear compression stocking as indicated by MD and would benefit from brief LE mobility in the seated position before standing up. He verbalized understanding. PT not indicated at this time.   CLINICAL DECISION MAKING: Stable/uncomplicated  EVALUATION COMPLEXITY: Low   PLAN:  PT FREQUENCY: one time visit  PT DURATION: other: 1x visit    Westley Foots, PT, DPT, CBIS 01/06/2023, 11:46 AM

## 2023-01-13 ENCOUNTER — Telehealth: Payer: Self-pay | Admitting: *Deleted

## 2023-01-13 NOTE — Telephone Encounter (Signed)
L/m for patient Nucala to arrive 4/30 and can call to schedule start of therapy

## 2023-01-20 ENCOUNTER — Ambulatory Visit (INDEPENDENT_AMBULATORY_CARE_PROVIDER_SITE_OTHER): Payer: Medicare PPO

## 2023-01-20 ENCOUNTER — Ambulatory Visit: Payer: Medicare PPO

## 2023-01-20 DIAGNOSIS — J4550 Severe persistent asthma, uncomplicated: Secondary | ICD-10-CM | POA: Diagnosis not present

## 2023-01-20 MED ORDER — MEPOLIZUMAB 100 MG/ML ~~LOC~~ SOSY
100.0000 mg | PREFILLED_SYRINGE | SUBCUTANEOUS | Status: DC
  Administered 2023-01-20 – 2023-07-04 (×6): 100 mg via SUBCUTANEOUS

## 2023-01-20 NOTE — Progress Notes (Signed)
Patient started Nucala injection today for asthma. Patient will be coming in every 4 weeks for injection. Kee waited in office for 15 minutes without any issues.

## 2023-01-23 ENCOUNTER — Ambulatory Visit: Payer: Medicare PPO

## 2023-01-24 ENCOUNTER — Telehealth: Payer: Self-pay

## 2023-01-24 NOTE — Telephone Encounter (Signed)
Spoke with patient to remind him that he is due for repeat labs at this time. No appointment is necessary. Patient is aware that he can stop by the lab in the basement at his convenience between 7:30 AM - 5 PM, Monday through Friday. Patient verbalized understanding and had no concerns at the end of the call.   

## 2023-01-24 NOTE — Telephone Encounter (Signed)
-----   Message from Missy Sabins, RN sent at 10/26/2022  4:13 PM EST ----- Regarding: 32-month lab reminder CBC and IBC + Ferritin - the orders are in epic

## 2023-01-25 DIAGNOSIS — L570 Actinic keratosis: Secondary | ICD-10-CM | POA: Diagnosis not present

## 2023-02-03 DIAGNOSIS — R42 Dizziness and giddiness: Secondary | ICD-10-CM | POA: Diagnosis not present

## 2023-02-03 DIAGNOSIS — G219 Secondary parkinsonism, unspecified: Secondary | ICD-10-CM | POA: Diagnosis not present

## 2023-02-03 DIAGNOSIS — I2510 Atherosclerotic heart disease of native coronary artery without angina pectoris: Secondary | ICD-10-CM | POA: Diagnosis not present

## 2023-02-03 DIAGNOSIS — R269 Unspecified abnormalities of gait and mobility: Secondary | ICD-10-CM | POA: Diagnosis not present

## 2023-02-03 DIAGNOSIS — I129 Hypertensive chronic kidney disease with stage 1 through stage 4 chronic kidney disease, or unspecified chronic kidney disease: Secondary | ICD-10-CM | POA: Diagnosis not present

## 2023-02-03 DIAGNOSIS — E119 Type 2 diabetes mellitus without complications: Secondary | ICD-10-CM | POA: Diagnosis not present

## 2023-02-06 DIAGNOSIS — Z5189 Encounter for other specified aftercare: Secondary | ICD-10-CM | POA: Diagnosis not present

## 2023-02-06 DIAGNOSIS — Z85828 Personal history of other malignant neoplasm of skin: Secondary | ICD-10-CM | POA: Diagnosis not present

## 2023-02-10 ENCOUNTER — Ambulatory Visit: Payer: Medicare PPO | Admitting: Allergy

## 2023-02-10 ENCOUNTER — Encounter: Payer: Self-pay | Admitting: Allergy

## 2023-02-10 ENCOUNTER — Other Ambulatory Visit: Payer: Self-pay

## 2023-02-10 VITALS — BP 130/70 | HR 81 | Temp 97.6°F | Resp 16 | Ht 67.0 in | Wt 195.0 lb

## 2023-02-10 DIAGNOSIS — J4551 Severe persistent asthma with (acute) exacerbation: Secondary | ICD-10-CM | POA: Diagnosis not present

## 2023-02-10 DIAGNOSIS — K219 Gastro-esophageal reflux disease without esophagitis: Secondary | ICD-10-CM

## 2023-02-10 DIAGNOSIS — J3089 Other allergic rhinitis: Secondary | ICD-10-CM | POA: Diagnosis not present

## 2023-02-10 DIAGNOSIS — H1013 Acute atopic conjunctivitis, bilateral: Secondary | ICD-10-CM | POA: Diagnosis not present

## 2023-02-10 DIAGNOSIS — R42 Dizziness and giddiness: Secondary | ICD-10-CM

## 2023-02-10 NOTE — Patient Instructions (Addendum)
  1.  Continue Nucala monthly injections  2.  Continue Breztri - 2 inhalations 2 times per day w/spacer (empty lungs)  3. Continue Singulair 10 mg - 1 tablet 1 time per day  4. Continue Nexium 40 mg - 1 tablet 1 time per day  5. Continue albuterol HFA or albuterol nebulization if needed  6. Use Pataday 1 drop each eye daily as needed for itchy eyes  7. Continue Allegra 180 mg - 1 tablet 1 time per day  8. Benadryl as needed  9. For current breathing and allergy flare take Prednisone 20mg  twice a day for next 5 days  8. Return to clinic in 3-4 months or sooner

## 2023-02-10 NOTE — Progress Notes (Signed)
Follow-up Note  RE: ADIL CIOCCA MRN: [id number] DOB: [birth year] Date of Office Visit: 02/10/2023   History of present illness: Jared [name] is a 72 y.o. male presenting today for sick visit.  He has history of severe persistent asthma, allergic rhinitis, LPRD as well as bladder outlet obstruction and tongue disorder.  He is a patient of Dr. Lucie Leather last seeing him on 12/20/2022.  He was mowing grass Tuesday and states by Wednesday night he was having worsening allergy symptoms.  He does not wear a mask or eye protection when he mows the lawn.  He states he does use a riding mower. He started having nasal and eye itch.   Denies eye redness, watering, grittiness.  Sometimes having clear nasal drainage but not throat clearing.  Denies sneezing, sinus pressure, tinnitus. He states he had a dry cough and can't bring up anything. No wheezing.   He started taking benadryl that helps a bit.  He is also taking allegra and an expectorant cough mediction. He did use albuterol around Wednesday and has not used nebulzier.  He states he is taking Breztri 2 puffs twice a day and Singulair at night.  He states he has about 3 other epsidoes of dizzy spells where he has fallen.  He was advised to increase his water intake as was told he was dehydrated.  He has increased his water intake but states he is still having dizzy spells.  He states he has been to his neurologist regarding this issue.  He states this morning he had a dizzy spell like "I'm out of control" and next thing he knew he was hanging on to refrigerator.  He states when he sits down the dizziness does improve.  It occurs when he is walking.  ------------------------- Of note upon discharge he seem to be quite dizzy again and was having some difficulty walking without having to stop and support himself on a ledge.  I do not want him driving himself home in this condition.  His wife was able to come to the office to pick him up.  He did have  stable vital signs.  His wife states that the symptoms seem to be getting worse as well as his gait and he is also closing his eyes tight and repetitive motion.  She states he has been making his eye movements for the past couple of weeks to months.  She states they do plan to call his PCP as well as his neurologist to see if there is other testing or treatments for these symptoms that seem to be worsening.  Review of systems: Review of Systems  HENT:         See HPI  Eyes:  Positive for itching.  Respiratory:  Positive for cough.   Neurological:  Positive for dizziness.     All other systems negative unless noted above in HPI  Past medical/social/surgical/family history have been reviewed and are unchanged unless specifically indicated below.  No changes  Medication List: Current Outpatient Medications  Medication Sig Dispense Refill   albuterol (PROVENTIL) (2.5 MG/3ML) 0.083% nebulizer solution Take 3 mLs (2.5 mg total) by nebulization every 6 (six) hours as needed for wheezing or shortness of breath. 75 mL 12   albuterol (VENTOLIN HFA) 108 (90 Base) MCG/ACT inhaler Inhale 2 puffs into the lungs every 4 (four) hours as needed for wheezing or shortness of breath. Can inhale two puffs every four to six hours as needed for cough, wheeze, shortness  of breath, or chest tightness. 18 g 1   ARIPiprazole (ABILIFY) 5 MG tablet Take 5 mg by mouth daily.     aspirin 325 MG EC tablet Take 325 mg by mouth daily.     DULoxetine (CYMBALTA) 60 MG capsule Take 60 mg by mouth every morning.     ergocalciferol (VITAMIN D2) 1.25 MG (50000 UT) capsule Take 50,000 Units by mouth once a week. Saturday     esomeprazole (NEXIUM) 20 MG capsule Take 1 capsule (20 mg total) by mouth daily. 90 capsule 1   Evolocumab (REPATHA SURECLICK) 140 MG/ML SOAJ Inject 1 Pen into the skin every 14 (fourteen) days. 2 mL 11   ipratropium-albuterol (DUONEB) 0.5-2.5 (3) MG/3ML SOLN Take 3 mLs by nebulization every 6 (six) hours as  needed (wheezing, coughing, shortness of breath). 360 mL 1   isosorbide mononitrate (IMDUR) 30 MG 24 hr tablet take 1 tablet by mouth once daily (Patient taking differently: at bedtime.) 15 tablet 0   levothyroxine (SYNTHROID) 50 MCG tablet Take 50 mcg by mouth daily before breakfast.     montelukast (SINGULAIR) 10 MG tablet Take 1 tablet (10 mg total) by mouth at bedtime. 90 tablet 1   Multiple Vitamin (MULTIVITAMIN) tablet Take 1 tablet by mouth at bedtime.      potassium chloride (KLOR-CON) 10 MEQ tablet Take 10 mEq by mouth daily as needed (low levels).     Spacer/Aero-Holding Chambers [facility] DIAMOND) MISC 1 each by Other route daily. Use as directed with inhaler. 1 each 1   telmisartan (MICARDIS) 20 MG tablet Take 20 mg by mouth daily.     tetrabenazine (XENAZINE) 12.5 MG tablet Take 1 tablet (12.5 mg total) by mouth daily. 30 tablet 5   traZODone (DESYREL) 50 MG tablet Take 50 mg by mouth at bedtime.     vitamin C (ASCORBIC ACID) 500 MG tablet Take 500 mg by mouth 2 (two) times daily.      budesonide (PULMICORT) 0.25 MG/2ML nebulizer solution USE FOR RESPIRATORY ILLNESS OR FLARE UP TWICE DAILY FOR 1-2 WEEKS (Patient not taking: Reported on 02/10/2023) 180 mL 1   budesonide-formoterol (SYMBICORT) 160-4.5 MCG/ACT inhaler Inhale 2 puffs into the lungs 2 (two) times daily. (Patient not taking: Reported on 02/10/2023) 10.2 g 5   Current Facility-Administered Medications  Medication Dose Route Frequency Provider Last Rate Last Admin   mepolizumab (NUCALA) SOSY 100 mg  100 mg Subcutaneous Q28 days Marcelyn Bruins, MD   100 mg at 01/20/23 1209   omalizumab Geoffry Paradise) injection 150 mg  150 mg Subcutaneous Q28 days Hetty Blend, FNP   150 mg at 06/20/22 1503   omalizumab (XOLAIR) prefilled syringe 150 mg  150 mg Subcutaneous Q28 days Birder Robson, MD   150 mg at 08/23/22 1113     Known medication allergies: Allergies  Allergen Reactions   Contrast Media [Iodinated Contrast Media]  Other (See Comments)    Was told not to take d/t pt only having 1 kidney   Nsaids Other (See Comments)    Was told not to take d/t pt only having 1 kidney   Penicillins Hives, Itching, Other (See Comments) and Nausea And Vomiting    Tolerates Cefepime  Has patient had a PCN reaction causing immediate rash, facial/tongue/throat swelling, SOB or lightheadedness with hypotension: Unknown  Has patient had a PCN reaction causing severe rash involving mucus membranes or skin necrosis: Unknown  Has patient had a PCN reaction that required hospitalization: Unknown  Has patient had a  PCN reaction occurring within the last 10 years: No  If all of the above answers are "NO", then may proceed with Cephalosporin use.  Has patient had a PCN reaction causing immediate rash, facial/tongue/throat swelling, SOB or lightheadedness with hypotension: Unknown, Has patient had a PCN reaction causing severe rash involving mucus membranes or skin necrosis: Unknown, Has patient had a PCN reaction that required hospitalization: Unknown, Has patient had a PCN reaction occurring within the last 10 years: No, If all of the above answers are "NO", then may proceed with Cephalosporin use.   Misc. Sulfonamide Containing Compounds Hives   Codeine Nausea Only and Nausea And Vomiting   Penicillamine Rash   Pholcodine Rash   Statins Other (See Comments)    Myalgias   Sulfonamide Derivatives Rash     Physical examination: Blood pressure 130/70, pulse 81, temperature 97.6 F (36.4 C), resp. rate 16, height 5\' 7"  (1.702 m), weight 195 lb (88.5 kg), SpO2 95 %.  General: Alert, interactive, in no acute distress. HEENT: Repeatedly closing his eyelid shut tight and reopening, PERRLA, no conjunctival or scleral injection, no foreign body visualized, no drainage, TMs pearly gray, turbinates minimally edematous without discharge, post-pharynx non erythematous. Neck: Supple without lymphadenopathy. Lungs: Mildly decreased breath  sounds with expiratory wheezing bilaterally. {no increased work of breathing. CV: Normal S1, S2 without murmurs. Abdomen: Nondistended, nontender. Skin: Warm and dry, without lesions or rashes. Extremities:  No clubbing, cyanosis or edema. Neuro:   Grossly intact.  Diagnositics/Labs: Provided with a DuoNeb in office based on lung exam  Assessment and plan: Severe persistent asthma Allergic rhinitis with conjunctivitis LPRD Dizziness   1.  Continue Nucala monthly injections  2.  Continue Breztri - 2 inhalations 2 times per day w/spacer (empty lungs)  3. Continue Singulair 10 mg - 1 tablet 1 time per day  4. Continue Nexium 40 mg - 1 tablet 1 time per day  5. Continue albuterol HFA or albuterol nebulization if needed  6. Use Pataday 1 drop each eye daily as needed for itchy eyes  7. Continue Allegra 180 mg - 1 tablet 1 time per day  8. Benadryl as needed  9. For current breathing and allergy flare take Prednisone 20mg  twice a day for next 5 days  8. Return to clinic in 3-4 months or sooner   It appears that he has had an allergy flare after mowing the grass this week.  This has also triggered an asthma flare.  He was wheezing in the office today.  He did seem visibly symptomatic in regards to his eyes.  He also has a tremor.  His gait was very shuffled movement.  We did call his wife to come pick him up from the office as it did not feel he was safe to drive himself home.  He did not appear to be having any type of emergent or urgent situation as his symptoms in regards to the dizziness, his gait and tremors are not new however per wife they do seem to be worsening.  He does have a neurologist which I did recommend they reach out to get a visit soon.  They also plan to reach out to his PCP.  He also mention potentially seeing ENT has been he has a inner ear issue that is leading to the dizziness and may be benefited with balance therapy.    In regards to his allergy and asthma  flare I did provide with prednisone to take which may also help with  his dizziness currently however discussed this is not a good treatment option for more long-term use.   I appreciate the opportunity to take part in Jaxn's care. Please do not hesitate to contact me with questions.  Sincerely,   Margo Aye, MD Allergy/Immunology [facility]

## 2023-02-14 ENCOUNTER — Telehealth: Payer: Self-pay

## 2023-02-14 MED ORDER — ALBUTEROL SULFATE HFA 108 (90 BASE) MCG/ACT IN AERS: 2.0000 | INHALATION_SPRAY | RESPIRATORY_TRACT | 1 refills | Status: AC | PRN

## 2023-02-14 MED ORDER — BREZTRI AEROSPHERE 160-9-4.8 MCG/ACT IN AERO: 2.0000 | INHALATION_SPRAY | Freq: Two times a day (BID) | RESPIRATORY_TRACT | 5 refills | Status: DC

## 2023-02-14 MED ORDER — FEXOFENADINE HCL 180 MG PO TABS: 180.0000 mg | ORAL_TABLET | Freq: Every day | ORAL | 1 refills | Status: DC

## 2023-02-14 MED ORDER — MONTELUKAST SODIUM 10 MG PO TABS: 10.0000 mg | ORAL_TABLET | Freq: Every day | ORAL | 1 refills | Status: DC

## 2023-02-14 MED ORDER — PREDNISONE 10 MG PO TABS: ORAL_TABLET | ORAL | 0 refills | Status: DC

## 2023-02-14 NOTE — Addendum Note (Signed)
Addended by: Orson Aloe on: 02/14/2023 12:31 PM   Modules accepted: Orders

## 2023-02-14 NOTE — Telephone Encounter (Signed)
Medications have been sent in and patient has been notified. I also apologized on my behalf.

## 2023-02-14 NOTE — Telephone Encounter (Signed)
Patient was seen on 02/10/2023 and his prednisone prescription was not sent in.  Per Dr Judeth Horn note:  9. For current breathing and allergy flare take Prednisone 20mg  twice a day for next 5 days   Please send to [facility] on [address]

## 2023-02-15 ENCOUNTER — Telehealth: Payer: Self-pay | Admitting: Cardiovascular Disease

## 2023-02-15 DIAGNOSIS — I129 Hypertensive chronic kidney disease with stage 1 through stage 4 chronic kidney disease, or unspecified chronic kidney disease: Secondary | ICD-10-CM | POA: Diagnosis not present

## 2023-02-15 DIAGNOSIS — R42 Dizziness and giddiness: Secondary | ICD-10-CM | POA: Diagnosis not present

## 2023-02-15 DIAGNOSIS — I2510 Atherosclerotic heart disease of native coronary artery without angina pectoris: Secondary | ICD-10-CM | POA: Diagnosis not present

## 2023-02-15 NOTE — Telephone Encounter (Signed)
Call to patient and LVM.  Advised if patient hit his head he should go to the ED to be evaluated based on description given of issues.  If he did not hit his head, they should reach out to PCP as it is not a cardiac issue and the PCP may want further testing related to fall.

## 2023-02-15 NOTE — Telephone Encounter (Signed)
.  Pt c/o Syncope: STAT if syncope occurred within 30 minutes and pt complains of lightheadedness High Priority if episode of passing out, completely, today or in last 24 hours   Did you pass out today?   No   When is the last time you passed out?   Last Sunday   Has this occurred multiple times?  No   Did you have any symptoms prior to passing out?   Dizziness  Patient stated this past Sunday he blacked out and fell on the floor hard.  Patient stated he has felt dizzy before without actually blacking out.  Patient stated his wife noticed that he is not walking like he used to and he has been unsteady on his feet.

## 2023-02-16 NOTE — Telephone Encounter (Signed)
Call Cell phone and straight to VM. LM to please call office Call to home number to home Patient states home from church and walked in house and hit  floor hard.  Hit knees which has scrapes. Did not hit head.  Has had dizzy spells for month and half.   Went to PCP yesterday regarding the spells and she stated he may need a monitor.  Set up for appt. For tomorrow with NP at [facility].  Patient aware of location of appt.

## 2023-02-17 ENCOUNTER — Encounter (HOSPITAL_BASED_OUTPATIENT_CLINIC_OR_DEPARTMENT_OTHER): Payer: Self-pay | Admitting: Family

## 2023-02-17 ENCOUNTER — Ambulatory Visit: Payer: Medicare PPO | Attending: Family

## 2023-02-17 ENCOUNTER — Ambulatory Visit (HOSPITAL_BASED_OUTPATIENT_CLINIC_OR_DEPARTMENT_OTHER): Payer: Medicare PPO | Admitting: Family

## 2023-02-17 VITALS — BP 115/74 | HR 81 | Ht 67.0 in | Wt 196.0 lb

## 2023-02-17 DIAGNOSIS — I10 Essential (primary) hypertension: Secondary | ICD-10-CM

## 2023-02-17 DIAGNOSIS — I25118 Atherosclerotic heart disease of native coronary artery with other forms of angina pectoris: Secondary | ICD-10-CM | POA: Diagnosis not present

## 2023-02-17 DIAGNOSIS — R42 Dizziness and giddiness: Secondary | ICD-10-CM | POA: Diagnosis not present

## 2023-02-17 DIAGNOSIS — R55 Syncope and collapse: Secondary | ICD-10-CM

## 2023-02-17 DIAGNOSIS — E785 Hyperlipidemia, unspecified: Secondary | ICD-10-CM

## 2023-02-17 MED ORDER — BENZONATATE 100 MG PO CAPS
100.0000 mg | ORAL_CAPSULE | Freq: Three times a day (TID) | ORAL | 0 refills | Status: DC | PRN
Start: 2023-02-17 — End: 2023-03-20

## 2023-02-17 NOTE — Patient Instructions (Signed)
Medication Instructions:  Your physician has recommended you make the following change in your medication:   Stop: Telmisartan  Stop: Amlodipine   Continue: Imdur  *If you need a refill on your cardiac medications before your next appointment, please call your pharmacy*   Lab Work: Your physician recommends that you return for lab work today- BMP, CBC  If you have labs (blood work) drawn today and your tests are completely normal, you will receive your results only by: MyChart Message (if you have MyChart) OR A paper copy in the mail If you have any lab test that is abnormal or we need to change your treatment, we will call you to review the results.   Testing/Procedures: Your physician has recommended that you wear a Zio AT Live monitor.   This monitor is a medical device that records the heart's electrical activity. Doctors most often use these monitors to diagnose arrhythmias. Arrhythmias are problems with the speed or rhythm of the heartbeat. The monitor is a small device applied to your chest. You can wear one while you do your normal daily activities. While wearing this monitor if you have any symptoms to push the button and record what you felt. Once you have worn this monitor for the period of time provider prescribed (Usually 14 days), you will return the monitor device in the postage paid box/bag. Once it is returned they will download the data collected and provide [place] with a report which the provider will then review and we will call you with those results.   Important tips:  Avoid showering during the first 24 hours of wearing the monitor. Avoid excessive sweating to help maximize wear time. Do not submerge the device, no hot tubs, and no swimming pools. Keep any lotions or oils away from the patch. After 24 hours you may shower with the patch on. Take brief showers with your back facing the shower head.  Do not remove patch once it has been placed because that will  interrupt data and decrease adhesive wear time. Push the button when you have any symptoms and write down what you were feeling. Once you have completed wearing your monitor, remove and place into box which has postage paid and place in your outgoing mailbox.  If for some reason you have misplaced your box then call our office and we can provide another box and/or mail it off for you. Keep the transmitter within 10 feet at all times.  Expect a welcome phone call within 24-48 hrs of application from Zio.  This call will include your copay information, so please answer any unknown phone calls while wearing Zio (it could also be important information about your heart) The envelope to return Zio is in the back of the transmitter. Removal instructions are on the last page of the symptom diary.  Place the patch sticky side up inside the transmitter and the symptom diary inside the envelope to return on your last wear day inside your mailbox or any USPS mailbox..    Follow-Up: At [facility], you and your health needs are our priority.  As part of our continuing mission to provide you with exceptional heart care, we have created designated Provider Care Teams.  These Care Teams include your primary Cardiologist (physician) and Advanced Practice Providers (APPs -  Physician Assistants and Nurse Practitioners) who all work together to provide you with the care you need, when you need it.  We recommend signing up for the patient portal called "  MyChart".  Sign up information is provided on this After Visit Summary.  MyChart is used to connect with patients for Virtual Visits (Telemedicine).  Patients are able to view lab/test results, encounter notes, upcoming appointments, etc.  Non-urgent messages can be sent to your provider as well.   To learn more about what you can do with MyChart, go to [email or web address].    Your next appointment:   Follow up as scheduled   Other Instructions We will  call you in one week to check on BP

## 2023-02-17 NOTE — Progress Notes (Unsigned)
Enrolled patient for a 14 day Zio AT monitor to be mailed to patients home  Allyson Sabal to read

## 2023-02-17 NOTE — Progress Notes (Signed)
Office Visit    Patient Name: Jared [name] Date of Encounter: 02/17/2023  PCP:  Jared Paradise, MD   [facility]  Cardiologist:  Jared Batty, MD  Advanced Practice Provider:  No care team member to display Electrophysiologist:  None      Chief Complaint    Jared [name] is a 72 y.o. male presents today for syncope, dizziness.    Past Medical History    Past Medical History:  Diagnosis Date   Anxiety    Asthma    Cervical vertebral fusion 05/02/2014   Now presenting with C5-6 radiculopathy   Coronary artery disease    COVID 01/2020   pain all over monoclonal antibodies given all symptoms resolved   GERD (gastroesophageal reflux disease)    H/O heart artery stent 2000   to right rca   Heart attack (HCC) 2009   History of depression 03/19/2021   History of esophageal stricture    History of spinal fracture 2015   Hyperlipidemia    Hypertension    Hypothyroidism    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HCC)    left arm tremor   Osteoarthritis    lower back   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   Renal cell carcinoma 1997   Left kidney   Sleep apnea    wears CPAP   Sleep talking    Snoring 12/25/2013   Wears glasses    Past Surgical History:  Procedure Laterality Date   ANTERIOR CERVICAL DECOMP/DISCECTOMY FUSION N/A 08/27/2014   Procedure: Cervical six-seven anterior cervical decompression fusion with removal of hardware at Cervical five-six;  Surgeon: Jared Harman, MD;  Location: [facility];  Service: Neurosurgery;  Laterality: N/A;  Cervical six-seven anterior cervical decompression fusion with removal of hardware at Cervical five-six   BACK SURGERY     1992, 2013 lower back   CARDIAC CATHETERIZATION  05/10/2007   Minimal CAD, normal LV systolic function, medical management   CARDIAC CATHETERIZATION  04/08/2008   RCA ulcerated 70-80% stenosis, stented with a 3x34mm Endeavor stent at 13atm for 50sec, reduced  from 80% ulcerated stenosis to 0%.   CARDIAC CATHETERIZATION  05/07/2009   50% distal left main disease-IVUS or flow wire too dangerous in particular setting to perform intervention.   CARDIAC CATHETERIZATION  03/18/2010   Medical management   CARDIOVASCULAR STRESS TEST  02/09/2012   Normal, no significant wall abnormalities noted   CARPAL TUNNEL RELEASE Bilateral 2000   COLONOSCOPY     COLONOSCOPY W/ BIOPSIES AND POLYPECTOMY     COLONOSCOPY WITH PROPOFOL  06/01/2021   Jared [name] at [facility]   CORONARY STENT PLACEMENT     FEMUR FRACTURE SURGERY Left 1995   HERNIA REPAIR     as infant groin   NECK SURGERY  2005   NEPHRECTOMY Left 1997   secondary to cancer   TOTAL KNEE ARTHROPLASTY Left 2005   TOTAL KNEE ARTHROPLASTY Right 04/13/2018   Procedure: RIGHT TOTAL KNEE ARTHROPLASTY;  Surgeon: Jared Low, MD;  Location: [facility] OR;  Service: Orthopedics;  Laterality: Right;   TRANSURETHRAL RESECTION OF PROSTATE N/A 03/25/2021   Procedure: TRANSURETHRAL RESECTION OF THE PROSTATE (TURP);  Surgeon: Jared Matar, MD;  Location: [facility];  Service: Urology;  Laterality: N/A;    Allergies  Allergies  Allergen Reactions   Contrast Media [Iodinated Contrast Media] Other (See Comments)    Was told not to take d/t pt only having 1 kidney   Nsaids Other (See Comments)  Was told not to take d/t pt only having 1 kidney   Penicillins Hives, Itching, Other (See Comments) and Nausea And Vomiting    Tolerates Cefepime  Has patient had a PCN reaction causing immediate rash, facial/tongue/throat swelling, SOB or lightheadedness with hypotension: Unknown  Has patient had a PCN reaction causing severe rash involving mucus membranes or skin necrosis: Unknown  Has patient had a PCN reaction that required hospitalization: Unknown  Has patient had a PCN reaction occurring within the last 10 years: No  If all of the above answers are "NO", then may proceed with Cephalosporin  use.  Has patient had a PCN reaction causing immediate rash, facial/tongue/throat swelling, SOB or lightheadedness with hypotension: Unknown, Has patient had a PCN reaction causing severe rash involving mucus membranes or skin necrosis: Unknown, Has patient had a PCN reaction that required hospitalization: Unknown, Has patient had a PCN reaction occurring within the last 10 years: No, If all of the above answers are "NO", then may proceed with Cephalosporin use.   Misc. Sulfonamide Containing Compounds Hives   Codeine Nausea Only and Nausea And Vomiting   Penicillamine Rash   Pholcodine Rash   Statins Other (See Comments)    Myalgias   Sulfonamide Derivatives Rash    History of Present Illness    Jared [name] is a 72 y.o. male with a hx of CAD (2020 LHC with stent-LCA 2013 LHC patent stent and improved 30% LM stenosis), hypertension, hyperlipidemia with statin intolerance (Lipitor, Crestor) last seen 05/04/2022 by Dr. Allyson [name]. At that time he had recently started Repatha.   Contacted the office 02/15/2023 noting episode of syncope over the weekend as well as episodes of dizziness.  He presents today for follow-up with his wife. Started feeling lightheadedness 1.5 months ago. No dizziness like spinning. Evaluated by PCP who found blood work was unremarkable per his report. Has been drinking 60-80 oz of water since that time. This past Sunday got home from church fell in the kitchen and blacked out without warning. Loss of consciousness or a couple seconds witnessed by his wife. Does not monitor BP at home. Denies  palpitations.   Orthostatic VS for the past 24 hrs (Last 3 readings):  BP- Lying Pulse- Lying BP- Sitting Pulse- Sitting BP- Standing at 0 minutes Pulse- Standing at 0 minutes BP- Standing at 3 minutes Pulse- Standing at 3 minutes  02/17/23 0828 115/74 80 (!) 89/58 87 98/63 90 98/63 96   EKGs/Labs/Other Studies Reviewed:   The following studies were reviewed today:  Cardiac  Studies & Procedures     STRESS TESTS  NM MYOCAR MULTI W/SPECT W 09/24/2013   ECHOCARDIOGRAM  ECHOCARDIOGRAM COMPLETE 04/06/2022  Narrative ECHOCARDIOGRAM REPORT    Patient Name:   Jared [name] Date of Exam: 04/06/2022 Medical Rec #:  [id number]         Height:       67.0 in Accession #:    [id number]        Weight:       190.0 lb Date of Birth:  03/27/1951         BSA:          1.979 m Patient Age:    71 years          BP:           12 2/78 mmHg Patient Gender: M                 HR:  70 bpm. Exam Location:  Outpatient  Procedure: 2D Echo, Cardiac Doppler, Color Doppler and 3D Echo  Indications:    Fatigue R53.83  History:        Patient has prior history of Echocardiogram examinations, most recent 02/19/2020. CAD; Risk Factors:Hypertension and Dyslipidemia.  Sonographer:    Thurman Coyer RDCS Referring Phys: [id number] JESSE M CLEAVER  IMPRESSIONS   1. Left ventricular ejection fraction by 3D volume is 64 %. The left ventricle has normal function. The left ventricle has no regional wall motion abnormalities. There is mild concentric left ventricular hypertrophy. Left ventricular diastolic parameters were normal. 2. Right ventricular systolic function is normal. The right ventricular size is normal. There is normal pulmonary artery systolic pressure. 3. The mitral valve is normal in structure. Trivial mitral valve regurgitation. No evidence of mitral stenosis. 4. The aortic valve has an indeterminant number of cusps. Aortic valve regurgitation is mild. No aortic stenosis is present. 5. The inferior vena cava is normal in size with greater than 50% respiratory variability, suggesting right atrial pressure of 3 mmHg.  FINDINGS Left Ventricle: Left ventricular ejection fraction by 3D volume is 64 %. The left ventricle has normal function. The left ventricle has no regional wall motion abnormalities. The left ventricular internal cavity size was normal in size. There  is mild concentric left ventricular hypertrophy. Left ventricular diastolic parameters were normal.  Right Ventricle: The right ventricular size is normal. No increase in right ventricular wall thickness. Right ventricular systolic function is normal. There is normal pulmonary artery systolic pressure. The tricuspid regurgitant velocity is 2.23 m/s, and with an assumed right atrial pressure of 3 mmHg, the estimated right ventricular systolic pressure is 22.9 mmHg.  Left Atrium: Left atrial size was normal in size.  Right Atrium: Right atrial size was normal in size.  Pericardium: There is no evidence of pericardial effusion. Presence of epicardial fat layer.  Mitral Valve: The mitral valve is normal in structure. Mild mitral annular calcification. Trivial mitral valve regurgitation. No evidence of mitral valve stenosis.  Tricuspid Valve: The tricuspid valve is normal in structure. Tricuspid valve regurgitation is not demonstrated. No evidence of tricuspid stenosis.  Aortic Valve: The aortic valve has an indeterminant number of cusps. Aortic valve regurgitation is mild. Aortic regurgitation PHT measures 390 msec. No aortic stenosis is present.  Pulmonic Valve: The pulmonic valve was not well visualized. Pulmonic valve regurgitation is not visualized. No evidence of pulmonic stenosis.  Aorta: The aortic root is normal in size and structure.  Venous: The inferior vena cava is normal in size with greater than 50% respiratory variability, suggesting right atrial pressure of 3 mmHg.  IAS/Shunts: No atrial level shunt detected by color flow Doppler.   LEFT VENTRICLE PLAX 2D LVIDd:         5.10 cm         Diastology LVIDs:         3.30 cm         LV e' medial:    6.66 cm/s LV PW:         1.20 cm         LV E/e' medial:  16.4 LV IVS:        1.10 cm         LV e' lateral:   8.33 cm/s LVOT diam:     2.30 cm         LV E/e' lateral: 13.1 LV SV:         119  LV SV Index:   60 LVOT Area:      4.15 cm        3D Volume EF LV 3D EF:    Left ventricul ar ejection fraction by 3D volume is 64 %.  3D Volume EF: 3D EF:        64 % LV EDV:       124 ml LV ESV:       44 ml LV SV:        80 ml  RIGHT VENTRICLE RV Basal diam:  3.30 cm RV Mid diam:    2.20 cm RV S prime:     11.00 cm/s TAPSE (M-mode): 1.6 cm  LEFT ATRIUM             Index        RIGHT ATRIUM           Index LA diam:        3.80 cm 1.92 cm/m   RA Area:     10.70 cm LA Vol (A2C):   47.7 ml 24.11 ml/m  RA Volume:   19.60 ml  9.91 ml/m LA Vol (A4C):   66.4 ml 33.56 ml/m LA Biplane Vol: 57.8 ml 29.21 ml/m AORTIC VALVE LVOT Vmax:   134.00 cm/s LVOT Vmean:  89.500 cm/s LVOT VTI:    0.286 m AI PHT:      390 msec  AORTA Ao Root diam: 3.50 cm Ao Asc diam:  3.40 cm  MITRAL VALVE                TRICUSPID VALVE MV Area (PHT): 3.54 cm     TR Peak grad:   19.9 mmHg MV Decel Time: 214 msec     TR Vmax:        223.00 cm/s MV E velocity: 109.00 cm/s MV A velocity: 117.00 cm/s  SHUNTS MV E/A ratio:  0.93         Systemic VTI:  0.29 m Systemic Diam: 2.30 cm  Kardie Tobb DO Electronically signed by Thomasene Ripple DO Signature Date/Time: 04/06/2022/4:15:39 PM    Final              EKG:  EKG is  ordered today.  The ekg ordered today demonstrates NSR 81 bpm with new incomplete right bundle branch block.  No acute ST/T wave changes.  Recent Labs: 03/02/2022: ALT 17 03/03/2022: BUN 19; Creatinine, Ser 1.25; Potassium 4.1; Sodium 141 08/31/2022: Hemoglobin 14.1; Platelets 286.0  Recent Lipid Panel    Component Value Date/Time   CHOL 152 05/14/2021 1158   TRIG 237 (H) 05/14/2021 1158   HDL 36 (L) 05/14/2021 1158   CHOLHDL 4.2 05/14/2021 1158   CHOLHDL 3.7 03/18/2010 0331   VLDL UNABLE TO CALCULATE IF TRIGLYCERIDE OVER 400 mg/dL 21/30/8657 8469   LDLCALC 77 05/14/2021 1158     Home Medications   Current Meds  Medication Sig   albuterol (PROVENTIL) (2.5 MG/3ML) 0.083% nebulizer solution Take 3 mLs  (2.5 mg total) by nebulization every 6 (six) hours as needed for wheezing or shortness of breath.   albuterol (VENTOLIN HFA) 108 (90 Base) MCG/ACT inhaler Inhale 2 puffs into the lungs every 4 (four) hours as needed for wheezing or shortness of breath. Can inhale two puffs every four to six hours as needed for cough, wheeze, shortness of breath, or chest tightness.   aspirin 325 MG EC tablet Take 325 mg by mouth daily.   benzonatate (TESSALON PERLES) 100 MG capsule Take 1  capsule (100 mg total) by mouth 3 (three) times daily as needed for cough.   Budeson-Glycopyrrol-Formoterol (BREZTRI AEROSPHERE) 160-9-4.8 MCG/ACT AERO Inhale 2 puffs into the lungs in the morning and at bedtime.   DULoxetine (CYMBALTA) 60 MG capsule Take 60 mg by mouth every morning.   ergocalciferol (VITAMIN D2) 1.25 MG (50000 UT) capsule Take 50,000 Units by mouth once a week. Saturday   esomeprazole (NEXIUM) 20 MG capsule Take 1 capsule (20 mg total) by mouth daily.   Evolocumab (REPATHA SURECLICK) 140 MG/ML SOAJ Inject 1 Pen into the skin every 14 (fourteen) days.   ipratropium-albuterol (DUONEB) 0.5-2.5 (3) MG/3ML SOLN Take 3 mLs by nebulization every 6 (six) hours as needed (wheezing, coughing, shortness of breath).   isosorbide mononitrate (IMDUR) 30 MG 24 hr tablet take 1 tablet by mouth once daily (Patient taking differently: at bedtime.)   levothyroxine (SYNTHROID) 50 MCG tablet Take 50 mcg by mouth daily before breakfast.   montelukast (SINGULAIR) 10 MG tablet Take 1 tablet (10 mg total) by mouth at bedtime.   Multiple Vitamin (MULTIVITAMIN) tablet Take 1 tablet by mouth at bedtime.    NUCALA 100 MG/ML SOSY Inject 1 mL into the skin every 30 (thirty) days.   potassium chloride (KLOR-CON) 10 MEQ tablet Take 10 mEq by mouth daily as needed ([name] levels).   predniSONE (DELTASONE) 10 MG tablet Take 2 tablets twice a day for next 5 days   Spacer/Aero-Holding Deretha [facility] DIAMOND) MISC 1 each by Other route daily. Use  as directed with inhaler.   traZODone (DESYREL) 50 MG tablet Take 50 mg by mouth at bedtime.   vitamin C (ASCORBIC ACID) 500 MG tablet Take 500 mg by mouth 2 (two) times daily.    [DISCONTINUED] amLODipine (NORVASC) 5 MG tablet Take 5 mg by mouth daily.   [DISCONTINUED] telmisartan (MICARDIS) 20 MG tablet Take 20 mg by mouth daily.   Current Facility-Administered Medications for the 02/17/23 encounter (Office Visit) with Alver Sorrow, NP  Medication   mepolizumab (NUCALA) SOSY 100 mg   omalizumab Jared [name]) injection 150 mg   omalizumab Jared [name]) prefilled syringe 150 mg    Review of Systems      All other systems reviewed and are otherwise negative except as noted above.  Physical Exam    VS:  BP 115/74   Pulse 81   Ht 5\' 7"  (1.702 m)   Wt 196 lb (88.9 kg)   BMI 30.70 kg/m  , BMI Body mass index is 30.7 kg/m.  Wt Readings from Last 3 Encounters:  02/17/23 196 lb (88.9 kg)  02/10/23 195 lb (88.5 kg)  12/21/22 199 lb (90.3 kg)    GEN: Well nourished, well developed, in no acute distress. HEENT: normal. Neck: Supple, no JVD, carotid bruits, or masses. Cardiac: RRR, no murmurs, rubs, or gallops. No clubbing, cyanosis, edema.  Radials/PT 2+ and equal bilaterally.  Respiratory:  Respirations regular and unlabored, clear to auscultation bilaterally. GI: Soft, nontender, nondistended. MS: No deformity or atrophy. Skin: Warm and dry, no rash. Neuro:  Strength and sensation are intact. Psych: Normal affect.  Assessment & Plan    Lightheadedness/syncope- 1.5 month history of lightheadedness with one episode of syncope Sunday with no prodromal symptoms.  Drinking 60-80 ounces of fluid and eating regular meals.  Hold telmisartan, amlodipine as below as orthostatic in clinic today.  Given episodes of syncope with no prodromal symptoms concern for arrhythmia, plan for live ZIO monitor.  Update CBC to rule out significant anemia and BMP  to assess electrolytes and rule out  dehydration. Echo and carotid duplex performed less than 1 year ago.  If aforementioned workup unremarkable could consider repeating.  HLD, LDL goal send 70-intolerant to atorvastatin, rosuvastatin.  CAD-s/p remote dominant RCA stenting by Dr. Clarene Duke in 2000.  Cardiac catheterization revealing widely patent stent with 30% left main stenosis which was improved from prior.  Cough - Recent allergy/asthma flare. Presently completing course of Prednisone. Rx Tessalon PRN QHS due to nighttime cough.   Hypertension - Orthostatic hypotension in clinic today.  He has not taken amlodipine in some time and instructed him not to resume.  Hold telmisartan.  Continue Imdur 30 mg daily. Discussed to monitor BP at home at least 2 hours after medications and sitting for 5-10 minutes.   Orthostatic VS for the past 24 hrs (Last 3 readings):  BP- Lying Pulse- Lying BP- Sitting Pulse- Sitting BP- Standing at 0 minutes Pulse- Standing at 0 minutes BP- Standing at 3 minutes Pulse- Standing at 3 minutes  02/17/23 0828 115/74 80 (!) 89/58 87 98/63 90 98/63 96            Disposition: Follow up  as scheduled  with Jared Batty, MD or APP.  Signed, Alver Sorrow, NP 02/17/2023, 9:50 AM [facility]

## 2023-02-18 LAB — BASIC METABOLIC PANEL
BUN/Creatinine Ratio: 13 (ref 10–24)
BUN: 20 mg/dL (ref 8–27)
CO2: 21 mmol/L (ref 20–29)
Calcium: 9.1 mg/dL (ref 8.6–10.2)
Chloride: 104 mmol/L (ref 96–106)
Creatinine, Ser: 1.49 mg/dL — ABNORMAL HIGH (ref 0.76–1.27)
Glucose: 89 mg/dL (ref 70–99)
Potassium: 5.3 mmol/L — ABNORMAL HIGH (ref 3.5–5.2)
Sodium: 141 mmol/L (ref 134–144)
eGFR: 49 mL/min/{1.73_m2} — ABNORMAL LOW (ref >59)

## 2023-02-18 LAB — CBC
Hematocrit: 43.2 % (ref 37.5–51.0)
Hemoglobin: 13.8 g/dL (ref 13.0–17.7)
MCH: 27.2 pg (ref 26.6–33.0)
MCHC: 31.9 g/dL (ref 31.5–35.7)
MCV: 85 fL (ref 79–97)
Platelets: 401 10*3/uL (ref 150–450)
RBC: 5.07 x10E6/uL (ref 4.14–5.80)
RDW: 14 % (ref 11.6–15.4)
WBC: 13.8 10*3/uL — ABNORMAL HIGH (ref 3.4–10.8)

## 2023-02-20 ENCOUNTER — Telehealth (HOSPITAL_BASED_OUTPATIENT_CLINIC_OR_DEPARTMENT_OTHER): Payer: Self-pay

## 2023-02-20 ENCOUNTER — Ambulatory Visit (INDEPENDENT_AMBULATORY_CARE_PROVIDER_SITE_OTHER): Payer: Medicare PPO

## 2023-02-20 ENCOUNTER — Ambulatory Visit (INDEPENDENT_AMBULATORY_CARE_PROVIDER_SITE_OTHER): Payer: Medicare PPO | Admitting: *Deleted

## 2023-02-20 ENCOUNTER — Telehealth: Payer: Self-pay | Admitting: *Deleted

## 2023-02-20 ENCOUNTER — Telehealth: Payer: Self-pay | Admitting: Cardiovascular Disease

## 2023-02-20 ENCOUNTER — Encounter (HOSPITAL_BASED_OUTPATIENT_CLINIC_OR_DEPARTMENT_OTHER): Payer: Self-pay

## 2023-02-20 VITALS — Ht 67.0 in | Wt 196.0 lb

## 2023-02-20 DIAGNOSIS — I10 Essential (primary) hypertension: Secondary | ICD-10-CM

## 2023-02-20 DIAGNOSIS — I25118 Atherosclerotic heart disease of native coronary artery with other forms of angina pectoris: Secondary | ICD-10-CM | POA: Diagnosis not present

## 2023-02-20 DIAGNOSIS — R42 Dizziness and giddiness: Secondary | ICD-10-CM

## 2023-02-20 DIAGNOSIS — E785 Hyperlipidemia, unspecified: Secondary | ICD-10-CM

## 2023-02-20 DIAGNOSIS — R55 Syncope and collapse: Secondary | ICD-10-CM

## 2023-02-20 DIAGNOSIS — J4551 Severe persistent asthma with (acute) exacerbation: Secondary | ICD-10-CM

## 2023-02-20 DIAGNOSIS — J4550 Severe persistent asthma, uncomplicated: Secondary | ICD-10-CM | POA: Diagnosis not present

## 2023-02-20 NOTE — Progress Notes (Signed)
   Nurse Visit   Date of Encounter: 02/20/2023 ID: Jared [name], DOB 1951-06-23, MRN [id number]  PCP:  Geoffry Paradise, MD   [facility] Providers Cardiologist:  Nanetta Batty, MD      Visit Details   VS:  Ht 5\' 7"  (1.702 m)   Wt 196 lb (88.9 kg)   BMI 30.70 kg/m  , BMI Body mass index is 30.7 kg/m.  Wt Readings from Last 3 Encounters:  02/20/23 196 lb (88.9 kg)  02/17/23 196 lb (88.9 kg)  02/10/23 195 lb (88.5 kg)     Reason for visit: Zio Live Monitor Placement  Performed today: Vitals Changes (medications, testing, etc.) : No changes  Length of Visit: 10 minutes    Medications Adjustments/Labs and Tests Ordered: No orders of the defined types were placed in this encounter.  No orders of the defined types were placed in this encounter.    Signed, Marlene Lard, RN  02/20/2023 3:12 PM

## 2023-02-20 NOTE — Telephone Encounter (Signed)
Left voicemail to return call to office.

## 2023-02-20 NOTE — Telephone Encounter (Signed)
Patient came in to get his Nucala shot, afterwards he stated that he saw Dr. Delorse Lek last week for a sick visit, he stated that he still was not feeling the best. He stated that he was still having a dry cough and issues with coughing up mucous, he stated that he still has an itchy/runny nose and his eyes are itching. He confirms that he is taking the Allegra 2 times daily and using Patady eye drops. He stated he is also taking some Tesselon pearles and has 2 days left of the prednisone that Dr. Delorse Lek prescribed. He was wondering if there was anything else he could do to help with his symptoms.

## 2023-02-20 NOTE — Patient Instructions (Signed)
Medication Instructions:  Your physician recommends that you continue on your current medications as directed. Please refer to the Current Medication list given to you today.  *If you need a refill on your cardiac medications before your next appointment, please call your pharmacy*  Follow-Up: At [facility], you and your health needs are our priority.  As part of our continuing mission to provide you with exceptional heart care, we have created designated Provider Care Teams.  These Care Teams include your primary Cardiologist (physician) and Advanced Practice Providers (APPs -  Physician Assistants and Nurse Practitioners) who all work together to provide you with the care you need, when you need it.  We recommend signing up for the patient portal called "MyChart".  Sign up information is provided on this After Visit Summary.  MyChart is used to connect with patients for Virtual Visits (Telemedicine).  Patients are able to view lab/test results, encounter notes, upcoming appointments, etc.  Non-urgent messages can be sent to your provider as well.   To learn more about what you can do with MyChart, go to [email or web address].    Your next appointment:   Follow up scheduled

## 2023-02-20 NOTE — Telephone Encounter (Signed)
Returned call to patient, patient states he got his heart monitor in the mail. His wife is bringing him up to the office to have it placed.

## 2023-02-20 NOTE — Telephone Encounter (Addendum)
Left message for patient to call back   ----- Message from Alver Sorrow, NP sent at 02/19/2023  1:35 PM EDT ----- Jared [name] blood cell count mildly elevated, if any new signs of infection (cough, fever, burning with urination) recommend reaching out to primary care provider. No anemia. Overall stable kidney function. Potassium mildly elevated. Ensure not taking potassium supplement. Repeat BMP in 1 week for monitoring.

## 2023-02-20 NOTE — Telephone Encounter (Signed)
Patient returned call, transferred from call center.   Patient hung up before call was transferred.

## 2023-02-20 NOTE — Telephone Encounter (Signed)
Pt returning nurses call, regarding applying monitor. Please advise

## 2023-02-20 NOTE — Addendum Note (Signed)
Addended by: Marlene Lard on: 02/20/2023 09:54 AM   Modules accepted: Orders

## 2023-02-20 NOTE — Telephone Encounter (Signed)
Patient returning call, transferred from call center. Results called to patient who verbalizes understanding! Bmp ordered and mailed to patient.      ----- Message from Alver Sorrow, NP sent at 02/19/2023  1:35 PM EDT ----- Cliffton Asters blood cell count mildly elevated, if any new signs of infection (cough, fever, burning with urination) recommend reaching out to primary care provider. No anemia. Overall stable kidney function. Potassium mildly elevated. Ensure not taking potassium supplement. Repeat BMP in 1 week for monitoring.

## 2023-02-21 ENCOUNTER — Ambulatory Visit: Payer: Medicare PPO | Admitting: Family Medicine

## 2023-02-21 ENCOUNTER — Telehealth: Payer: Self-pay | Admitting: Cardiovascular Disease

## 2023-02-21 ENCOUNTER — Encounter: Payer: Self-pay | Admitting: Family Medicine

## 2023-02-21 VITALS — BP 132/78 | HR 80 | Temp 98.4°F | Wt 197.7 lb

## 2023-02-21 DIAGNOSIS — J3089 Other allergic rhinitis: Secondary | ICD-10-CM

## 2023-02-21 DIAGNOSIS — C61 Malignant neoplasm of prostate: Secondary | ICD-10-CM | POA: Diagnosis not present

## 2023-02-21 DIAGNOSIS — H1013 Acute atopic conjunctivitis, bilateral: Secondary | ICD-10-CM

## 2023-02-21 DIAGNOSIS — K219 Gastro-esophageal reflux disease without esophagitis: Secondary | ICD-10-CM

## 2023-02-21 DIAGNOSIS — E291 Testicular hypofunction: Secondary | ICD-10-CM | POA: Diagnosis not present

## 2023-02-21 DIAGNOSIS — J4551 Severe persistent asthma with (acute) exacerbation: Secondary | ICD-10-CM

## 2023-02-21 MED ORDER — METHYLPREDNISOLONE ACETATE 40 MG/ML IJ SUSP
40.0000 mg | Freq: Once | INTRAMUSCULAR | Status: AC
  Administered 2023-02-21: 40 mg via INTRAMUSCULAR

## 2023-02-21 NOTE — Telephone Encounter (Signed)
Called patient . Patient states he had monitor placed yesterday , the monitor is not staying on his body , patient would like to speak to [facility] NP 's nurse  Message routed

## 2023-02-21 NOTE — Telephone Encounter (Signed)
Patient is returning call. Requesting return call.  

## 2023-02-21 NOTE — Telephone Encounter (Signed)
Can you please ask if this patient is having a fever or any pain with cough. Thank you

## 2023-02-21 NOTE — Progress Notes (Incomplete)
   [address] [place] [address] Dept: [phone]  FOLLOW UP NOTE  Patient ID: Jared [name], male    DOB: [birth year]  Age: 72 y.o. MRN: [id number] Date of Office Visit: 02/21/2023  Assessment  Chief Complaint: Asthma, Follow-up, Cough (Dried cough), Wheezing, and Allergies (Itching nose and eye)  HPI Jared [name]    Drug Allergies:  Allergies  Allergen Reactions  . 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 Penicillins Hives, Itching, Other (See Comments) and Nausea And Vomiting    Tolerates Cefepime  Has patient had a PCN reaction causing immediate rash, facial/tongue/throat swelling, SOB or lightheadedness with hypotension: Unknown  Has patient had a PCN reaction causing severe rash involving mucus membranes or skin necrosis: Unknown  Has patient had a PCN reaction that required hospitalization: Unknown  Has patient had a PCN reaction occurring within the last 10 years: No  If all of the above answers are "NO", then may proceed with Cephalosporin use.  Has patient had a PCN reaction causing immediate rash, facial/tongue/throat swelling, SOB or lightheadedness with hypotension: Unknown, Has patient had a PCN reaction causing severe rash involving mucus membranes or skin necrosis: Unknown, Has patient had a PCN reaction that required hospitalization: Unknown, Has patient had a PCN reaction occurring within the last 10 years: No, If all of the above answers are "NO", then may proceed with Cephalosporin use.  . Misc. Sulfonamide Containing Compounds Hives  . Codeine Nausea Only and Nausea And Vomiting  . Penicillamine Rash  . Pholcodine Rash  . Statins Other (See Comments)    Myalgias  . Sulfonamide Derivatives Rash    Physical Exam: BP 132/78   Pulse 80   Temp 98.4 F (36.9 C) (Temporal)   Wt  197 lb 11.2 oz (89.7 kg)   SpO2 96%   BMI 30.96 kg/m    Physical Exam  Diagnostics:    Assessment and Plan: No diagnosis found.  No orders of the defined types were placed in this encounter.   There are no Patient Instructions on file for this visit.  No follow-ups on file.    Thank you for the opportunity to care for this patient.  Please do not hesitate to contact me with questions.  Thermon Leyland, FNP [facility]     +

## 2023-02-21 NOTE — Telephone Encounter (Signed)
Per patient's wife he is been seen at the [place] [facility] office by Thurston Hole today.

## 2023-02-21 NOTE — Telephone Encounter (Signed)
I called the patient and left a message for him to call the [facility] office back in regards to his symptoms.

## 2023-02-21 NOTE — Telephone Encounter (Signed)
Returned call to patient, advised him to mail the monitor back and we will have a new one overnighted to him.

## 2023-02-21 NOTE — Patient Instructions (Signed)
  1.  Continue Nucala monthly injections  2.  Continue Breztri - 2 inhalations 2 times per day w/spacer (empty lungs)  3. Continue Singulair 10 mg - 1 tablet 1 time per day  4. Continue Nexium 40 mg - 1 tablet 1 time per day  5. Continue albuterol HFA or albuterol nebulization if needed  6. Use Pataday 1 drop each eye daily as needed for itchy eyes  7. Continue Allegra 180 mg - 1 tablet 1 time per day  8. Benadryl as needed  9. Depo-Medrol given in the clinic  8. Return to clinic in 1 month or sooner

## 2023-02-21 NOTE — Telephone Encounter (Signed)
Left voicemail message for the patient to call the clinic. 

## 2023-02-21 NOTE — Telephone Encounter (Signed)
Pt was returning call and is requesting a call back. Please advise

## 2023-02-22 ENCOUNTER — Encounter: Payer: Self-pay | Admitting: Family Medicine

## 2023-02-22 DIAGNOSIS — H1013 Acute atopic conjunctivitis, bilateral: Secondary | ICD-10-CM | POA: Insufficient documentation

## 2023-02-22 DIAGNOSIS — G4733 Obstructive sleep apnea (adult) (pediatric): Secondary | ICD-10-CM | POA: Diagnosis not present

## 2023-02-22 DIAGNOSIS — F3181 Bipolar II disorder: Secondary | ICD-10-CM | POA: Diagnosis not present

## 2023-02-22 DIAGNOSIS — F5105 Insomnia due to other mental disorder: Secondary | ICD-10-CM | POA: Diagnosis not present

## 2023-02-22 NOTE — Progress Notes (Signed)
[address] [place] [address] Dept: [phone]  FOLLOW UP NOTE  Patient ID: Jared [name], male    DOB: Aug 19, 1951  Age: 72 y.o. MRN: [id number] Date of Office Visit: 02/21/2023  Assessment  Chief Complaint: Asthma, Follow-up, Cough (Dried cough), Wheezing, and Allergies (Itching nose and eye)  HPI Jared [name] is a 72 year old male who presents to the clinic for an acute visit.  He was last seen in this clinic on 02/10/2023 by Dr. Delorse Lek for evaluation of asthma, allergic rhinitis, allergic conjunctivitis, reflux, and dizziness.  At that visit, he was prescribed a prednisone taper for an asthma flare.  At today's visit, he reports his asthma has been moderately well-controlled with some wheeze and dry cough.  He continues montelukast 10 mg once a day, Breztri 2 puffs twice a day with a spacer, albuterol as needed, and Nucala injections once a month.  He reports that the prednisone did help with his symptoms, however, he is requesting a steroid injection at today's visit.  Allergic rhinitis is reported as poorly controlled with symptoms including clear rhinorrhea and pruritus.  He continues Allegra 180 mg once a day and Benadryl as needed with no relief of symptoms.  Allergic conjunctivitis is reported as poorly controlled with overwhelming pruritus and slight redness.  He continues olopatadine with no relief of symptoms.  He denies dizzy episodes since his last visit to this clinic, he has visited his cardiologist for application of monitoring system, however, he reports this has already come off and he needs to make an additional appointment with his cardiologist for replacement of this device.  His current medications are listed in the chart.   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   Nsaids Other (See Comments)    Was told not to take d/t pt  only having 1 kidney   Penicillins Hives, Itching, Other (See Comments) and Nausea And Vomiting    Tolerates Cefepime  Has patient had a PCN reaction causing immediate rash, facial/tongue/throat swelling, SOB or lightheadedness with hypotension: Unknown  Has patient had a PCN reaction causing severe rash involving mucus membranes or skin necrosis: Unknown  Has patient had a PCN reaction that required hospitalization: Unknown  Has patient had a PCN reaction occurring within the last 10 years: No  If all of the above answers are "NO", then may proceed with Cephalosporin use.  Has patient had a PCN reaction causing immediate rash, facial/tongue/throat swelling, SOB or lightheadedness with hypotension: Unknown, Has patient had a PCN reaction causing severe rash involving mucus membranes or skin necrosis: Unknown, Has patient had a PCN reaction that required hospitalization: Unknown, Has patient had a PCN reaction occurring within the last 10 years: No, If all of the above answers are "NO", then may proceed with Cephalosporin use.   Misc. Sulfonamide Containing Compounds Hives   Codeine Nausea Only and Nausea And Vomiting   Penicillamine Rash   Pholcodine Rash   Statins Other (See Comments)    Myalgias   Sulfonamide Derivatives Rash    Physical Exam: BP 132/78   Pulse 80   Temp 98.4 F (36.9 C) (Temporal)   Wt 197 lb 11.2 oz (89.7 kg)   SpO2 96%   BMI 30.96 kg/m    Physical Exam Vitals reviewed.  Constitutional:      Appearance: Normal appearance.  HENT:  Head: Normocephalic and atraumatic.     Right Ear: Tympanic membrane normal.     Left Ear: Tympanic membrane normal.     Nose:     Comments: Bilateral naris edematous and pale with thin clear nasal drainage noted.  Pharynx normal.  Ears normal.  Eyes slightly injected.  Patient blinking his eyes frequently and scratching his nose frequently during the interview.    Mouth/Throat:     Pharynx: Oropharynx is clear.   Cardiovascular:     Rate and Rhythm: Normal rate and regular rhythm.     Heart sounds: Normal heart sounds. No murmur heard. Pulmonary:     Effort: Pulmonary effort is normal.     Breath sounds: Normal breath sounds.     Comments: Lungs clear to auscultation Musculoskeletal:        General: Normal range of motion.     Cervical back: Normal range of motion and neck supple.     Comments: No tremor noted at today's visit  Skin:    General: Skin is warm and dry.  Neurological:     Mental Status: He is alert and oriented to person, place, and time.  Psychiatric:        Mood and Affect: Mood normal.        Behavior: Behavior normal.        Thought Content: Thought content normal.        Judgment: Judgment normal.     Assessment and Plan: 1. Severe persistent asthma with exacerbation   2. LPRD (laryngopharyngeal reflux disease)   3. Allergic conjunctivitis of both eyes   4. Non-seasonal allergic rhinitis due to other allergic trigger     Meds ordered this encounter  Medications   methylPREDNISolone acetate (DEPO-MEDROL) injection 40 mg    Patient Instructions   1.  Continue Nucala monthly injections  2.  Continue Breztri - 2 inhalations 2 times per day w/spacer (empty lungs)  3. Continue Singulair 10 mg - 1 tablet 1 time per day  4. Continue Nexium 40 mg - 1 tablet 1 time per day  5. Continue albuterol HFA or albuterol nebulization if needed  6. Use Pataday 1 drop each eye daily as needed for itchy eyes  7. Continue Allegra 180 mg - 1 tablet 1 time per day  8. Benadryl as needed  9. Depo-Medrol given in the clinic  8. Return to clinic in 1 month or sooner   No follow-ups on file.    Thank you for the opportunity to care for this patient.  Please do not hesitate to contact me with questions.  Thermon Leyland, FNP [facility]

## 2023-02-23 ENCOUNTER — Ambulatory Visit: Payer: Medicare PPO | Admitting: Family Medicine

## 2023-02-23 DIAGNOSIS — M25562 Pain in left knee: Secondary | ICD-10-CM | POA: Diagnosis not present

## 2023-02-23 DIAGNOSIS — M545 Low back pain, unspecified: Secondary | ICD-10-CM | POA: Diagnosis not present

## 2023-02-24 ENCOUNTER — Telehealth (HOSPITAL_BASED_OUTPATIENT_CLINIC_OR_DEPARTMENT_OTHER): Payer: Self-pay

## 2023-02-24 DIAGNOSIS — R42 Dizziness and giddiness: Secondary | ICD-10-CM | POA: Diagnosis not present

## 2023-02-24 DIAGNOSIS — I25118 Atherosclerotic heart disease of native coronary artery with other forms of angina pectoris: Secondary | ICD-10-CM | POA: Diagnosis not present

## 2023-02-24 NOTE — Telephone Encounter (Signed)
Calling with his bp: Tuesday: 130/72 Wednesday: 134/76 Thursday: 132/72

## 2023-02-24 NOTE — Telephone Encounter (Signed)
Returned call to patient and reviewed the following recommendations, patient verbalizes understanding.    "BP reasonable. Recommend remaining off Amlodpine, Telmisartan until follow up. If BP consistently >135/80 recommend  he let us know and we may consider adding back an antihypertensive agent.    Alver Sorrow, NP"

## 2023-02-24 NOTE — Telephone Encounter (Signed)
BP reasonable. Recommend remaining off Amlodpine, Telmisartan until follow up. If BP consistently >135/80 recommend  he let us know and we may consider adding back an antihypertensive agent.   Alver Sorrow, NP

## 2023-02-24 NOTE — Telephone Encounter (Addendum)
Left message for patient to call back, instructed patient to leave BP log with phone operators and we will review them.    ----- Message from Marlene Lard, RN sent at 02/17/2023  9:08 AM EDT ----- Call for BP

## 2023-02-24 NOTE — Telephone Encounter (Signed)
Bp log

## 2023-02-27 ENCOUNTER — Other Ambulatory Visit (HOSPITAL_BASED_OUTPATIENT_CLINIC_OR_DEPARTMENT_OTHER): Payer: Self-pay | Admitting: Family

## 2023-02-27 DIAGNOSIS — I10 Essential (primary) hypertension: Secondary | ICD-10-CM

## 2023-02-27 DIAGNOSIS — R42 Dizziness and giddiness: Secondary | ICD-10-CM

## 2023-02-27 DIAGNOSIS — E785 Hyperlipidemia, unspecified: Secondary | ICD-10-CM

## 2023-02-27 DIAGNOSIS — I25118 Atherosclerotic heart disease of native coronary artery with other forms of angina pectoris: Secondary | ICD-10-CM

## 2023-02-27 DIAGNOSIS — R55 Syncope and collapse: Secondary | ICD-10-CM

## 2023-02-27 DIAGNOSIS — G20C Parkinsonism, unspecified: Secondary | ICD-10-CM | POA: Diagnosis not present

## 2023-02-28 ENCOUNTER — Telehealth: Payer: Self-pay | Admitting: Allergy and Immunology

## 2023-02-28 NOTE — Telephone Encounter (Signed)
Lm for pt to call us back about scheduling a tele visit or in person visit

## 2023-02-28 NOTE — Telephone Encounter (Signed)
Patient called and said that he still has a lot of congestion and coughing really hard. He has clear mucus. He is taking over the counter meds. Need some help. He has been in 2 times [facility] [address] [phone]

## 2023-02-28 NOTE — Telephone Encounter (Signed)
Lm for pt to call us back about his congestion When did it start What has he tried Any fever or chills discoloration

## 2023-02-28 NOTE — Telephone Encounter (Signed)
Please have him do a televisit or in person. Thank you

## 2023-02-28 NOTE — Telephone Encounter (Signed)
Patient returned call stating this symptoms started  two weeks ago after a Allergic reaction he has tried over the counter medications such as Benadryl, Allegra, Zyrtec, and Robitussin  but the mucus continues with light yellow in color patient deny's Fever or chills please advise

## 2023-03-01 ENCOUNTER — Telehealth: Payer: Self-pay | Admitting: Family Medicine

## 2023-03-01 ENCOUNTER — Other Ambulatory Visit: Payer: Self-pay

## 2023-03-01 ENCOUNTER — Encounter: Payer: Self-pay | Admitting: Family Medicine

## 2023-03-01 ENCOUNTER — Ambulatory Visit
Admission: RE | Admit: 2023-03-01 | Discharge: 2023-03-01 | Disposition: A | Discharge status: Home / Self Care | Payer: Medicare PPO | Source: Ambulatory Visit | Attending: Family Medicine | Admitting: Family Medicine

## 2023-03-01 ENCOUNTER — Ambulatory Visit: Payer: Medicare PPO | Admitting: Family Medicine

## 2023-03-01 DIAGNOSIS — R051 Acute cough: Secondary | ICD-10-CM | POA: Diagnosis not present

## 2023-03-01 DIAGNOSIS — E349 Endocrine disorder, unspecified: Secondary | ICD-10-CM | POA: Diagnosis not present

## 2023-03-01 DIAGNOSIS — R0602 Shortness of breath: Secondary | ICD-10-CM | POA: Diagnosis not present

## 2023-03-01 DIAGNOSIS — R059 Cough, unspecified: Secondary | ICD-10-CM | POA: Diagnosis not present

## 2023-03-01 DIAGNOSIS — N401 Enlarged prostate with lower urinary tract symptoms: Secondary | ICD-10-CM | POA: Diagnosis not present

## 2023-03-01 DIAGNOSIS — R351 Nocturia: Secondary | ICD-10-CM | POA: Diagnosis not present

## 2023-03-01 DIAGNOSIS — C61 Malignant neoplasm of prostate: Secondary | ICD-10-CM | POA: Diagnosis not present

## 2023-03-01 MED ORDER — AZITHROMYCIN 250 MG PO TABS: ORAL_TABLET | ORAL | 0 refills | Status: DC

## 2023-03-01 NOTE — Progress Notes (Signed)
RE: Jared [name] MRN: [id number] DOB: 17-Nov-1950 Date of Telemedicine Visit: 03/01/2023  Referring provider: Geoffry Paradise, MD Primary care provider: Geoffry Paradise, MD  Chief Complaint: Asthma (Still has dry cough. Called multiple times and no one called him back. 2 weeks duration. No medication has helped.)   Telemedicine Follow Up Visit via Telephone: I connected with Jared [name] for a follow up on 03/01/23 by telephone and verified that I am speaking with the correct person using two identifiers.   I discussed the limitations, risks, security and privacy concerns of performing an evaluation and management service by telephone and the availability of in person appointments. I also discussed with the patient that there may be a patient responsible charge related to this service. The patient expressed understanding and agreed to proceed.  Patient is at home  Provider is at the office.  Visit start time: 1014 Visit end time: 1030 Insurance consent/check in by: Jared [name] Medical consent and medical assistant/nurse: Jared [name]  History of Present Illness: He is a 72 y.o. male, who is being followed for asthma and allergic rhinitis. His previous allergy office visit was on 02/21/2023 with Jared Leyland, FNP.  In the interim, he visited our clinic on 02/10/2023 for evaluation of allergy flare asthma exacerbation for which he received a prednisone taper.  He reported extreme dizziness at that time.  He returned to the clinic on 02/21/2023 reporting residual wheeze and dry cough as well as clear rhinorrhea and pruritus at which time he was given a Depo-Medrol injection.  Since that time, he has visited with his cardiologist who has changed his blood pressure medicine and he reports his dizziness has completely resolved.  At today's visit, he reports his asthma has been moderately well-controlled with cough producing clear mucus as the main symptom.  He does report that he frequently has trouble  moving this phlegm out of his chest.  He denies shortness of breath or wheeze.  He denies fever, sweats, chills, or sick contacts.  He continues Breztri 2 puffs twice a day with a spacer and rarely uses his albuterol inhaler.  He is not currently using budesonide via nebulizer.  He reports that he has tried Robitussin DM with mild relief of symptoms.  Allergic rhinitis is reported as poorly controlled with symptoms including clear to light yellow rhinorrhea and occasional nasal congestion as the main symptoms.  He continues Allegra 180 mg once a day and occasionally takes Benadryl as needed with moderate relief of symptoms.  Reflux is reported as well-controlled with no symptoms including heartburn or vomiting.  He continues esomeprazole 20 mg once a day.  His current medications are listed in the chart.  Assessment and Plan: Jared [name] is a 72 y.o. male with: Patient Instructions  For current episode Begin azithromycin  Begin budesonide via nebulizer twice a day for 1 week or until cough and wheeze free   To prevent symptoms Continue Breztri - 2 inhalations 2 times per day w/spacer (empty lungs) Continue Nucala monthly injections  Continue Singulair 10 mg - 1 tablet 1 time per day Continue Nexium 40 mg - 1 tablet 1 time per day Continue Allegra 180 mg - 1 tablet 1 time per day As needed Continue albuterol HFA or albuterol nebulization if needed Use Pataday 1 drop each eye daily as needed for itchy eyes Benadryl as needed    Call the clinic if this treatment plan is not working well for you  Follow up in 2 weeks or sooner if needed.  Return in about 2 weeks (around 03/15/2023), or if symptoms worsen or fail to improve.  Meds ordered this encounter  Medications   azithromycin (ZITHROMAX) 250 MG tablet    Sig: 500 mg on the first day and 250 mg for days 2-5    Dispense:  6 each    Refill:  0    Diagnostics: Chest xray ordered  Medication List:  Current Outpatient  Medications  Medication Sig Dispense Refill   albuterol (PROVENTIL) (2.5 MG/3ML) 0.083% nebulizer solution Take 3 mLs (2.5 mg total) by nebulization every 6 (six) hours as needed for wheezing or shortness of breath. 75 mL 12   albuterol (VENTOLIN HFA) 108 (90 Base) MCG/ACT inhaler Inhale 2 puffs into the lungs every 4 (four) hours as needed for wheezing or shortness of breath. Can inhale two puffs every four to six hours as needed for cough, wheeze, shortness of breath, or chest tightness. 18 g 1   aspirin 325 MG EC tablet Take 325 mg by mouth daily.     azithromycin (ZITHROMAX) 250 MG tablet 500 mg on the first day and 250 mg for days 2-5 6 each 0   benzonatate (TESSALON PERLES) 100 MG capsule Take 1 capsule (100 mg total) by mouth 3 (three) times daily as needed for cough. 20 capsule 0   Budeson-Glycopyrrol-Formoterol (BREZTRI AEROSPHERE) 160-9-4.8 MCG/ACT AERO Inhale 2 puffs into the lungs in the morning and at bedtime. 10.7 g 5   DULoxetine (CYMBALTA) 60 MG capsule Take 60 mg by mouth every morning.     ergocalciferol (VITAMIN D2) 1.25 MG (50000 UT) capsule Take 50,000 Units by mouth once a week. Saturday     esomeprazole (NEXIUM) 20 MG capsule Take 1 capsule (20 mg total) by mouth daily. 90 capsule 1   Evolocumab (REPATHA SURECLICK) 140 MG/ML SOAJ Inject 1 Pen into the skin every 14 (fourteen) days. 2 mL 11   ipratropium-albuterol (DUONEB) 0.5-2.5 (3) MG/3ML SOLN Take 3 mLs by nebulization every 6 (six) hours as needed (wheezing, coughing, shortness of breath). 360 mL 1   isosorbide mononitrate (IMDUR) 30 MG 24 hr tablet take 1 tablet by mouth once daily (Patient taking differently: at bedtime.) 15 tablet 0   levothyroxine (SYNTHROID) 50 MCG tablet Take 50 mcg by mouth daily before breakfast.     montelukast (SINGULAIR) 10 MG tablet Take 1 tablet (10 mg total) by mouth at bedtime. 90 tablet 1   Multiple Vitamin (MULTIVITAMIN) tablet Take 1 tablet by mouth at bedtime.      NUCALA 100 MG/ML SOSY  Inject 1 mL into the skin every 30 (thirty) days.     potassium chloride (KLOR-CON) 10 MEQ tablet Take 10 mEq by mouth daily as needed (low levels).     predniSONE (DELTASONE) 10 MG tablet Take 2 tablets twice a day for next 5 days 20 tablet 0   Spacer/Aero-Holding Chambers (OPTICHAMBER DIAMOND) MISC 1 each by Other route daily. Use as directed with inhaler. 1 each 1   traZODone (DESYREL) 50 MG tablet Take 50 mg by mouth at bedtime.     vitamin C (ASCORBIC ACID) 500 MG tablet Take 500 mg by mouth 2 (two) times daily.      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   Nsaids Other (See Comments)    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  Has patient had a PCN reaction causing immediate rash, facial/tongue/throat swelling, SOB or lightheadedness with  hypotension: Unknown, Has patient had a PCN reaction causing severe rash involving mucus membranes or skin necrosis: Unknown, Has patient had a PCN reaction that required hospitalization: Unknown, Has patient had a PCN reaction occurring within the last 10 years: No, If all of the above answers are "NO", then may proceed with Cephalosporin use.   Misc. Sulfonamide Containing Compounds Hives   Codeine Nausea Only and Nausea And Vomiting   Penicillamine Rash   Pholcodine Rash   Statins Other (See Comments)    Myalgias   Sulfonamide Derivatives Rash   I reviewed his past medical history, social history, family history, and environmental history and no significant changes have been reported from previous visit on 02/21/2023.   Objective: Physical Exam Not obtained as encounter was done via telephone.   Previous notes and tests were reviewed.  I discussed the assessment and treatment plan with the patient. The patient was provided an opportunity to ask questions and all were answered. The patient agreed with the plan and demonstrated an understanding of the instructions.   The patient was advised to call back or seek an in-person evaluation if the symptoms worsen or if the condition fails to improve as anticipated.  I provided 16 minutes of non-face-to-face time during this encounter.  It was my pleasure to participate in Kahner Yanik care today. Please feel free to contact me with any questions or concerns.   Sincerely,  Jared Leyland, FNP

## 2023-03-01 NOTE — Telephone Encounter (Signed)
LMOM for patient to call the clinic for results.

## 2023-03-01 NOTE — Progress Notes (Signed)
Can you please let this patient know that the chest xray is clear with no acute cardiopulmonary disease process occurring. Please call in azithromycin 500 mg on the first day and 250 mg for days 2-5. Please remind him to continue Breztri 2 puffs twice a day, budesonide via nebulizer twice a day, and albuterol as needed. Have him take Mucinex [phone] mg twice a day and increase fluid intake as tolerated. Please have him follow up with Dr. Lucie Leather in about 2 weeks. Thank you

## 2023-03-01 NOTE — Patient Instructions (Addendum)
For current episode Begin azithromycin  Begin budesonide via nebulizer twice a day for 1 week or until cough and wheeze free   To prevent symptoms Continue Breztri - 2 inhalations 2 times per day w/spacer (empty lungs) Continue Nucala monthly injections  Continue Singulair 10 mg - 1 tablet 1 time per day Continue Nexium 40 mg - 1 tablet 1 time per day Continue Allegra 180 mg - 1 tablet 1 time per day As needed Continue albuterol HFA or albuterol nebulization if needed Use Pataday 1 drop each eye daily as needed for itchy eyes Benadryl as needed    Call the clinic if this treatment plan is not working well for you  Follow up in 2 weeks or sooner if needed.

## 2023-03-06 NOTE — Telephone Encounter (Signed)
Lm for pt to call us back to do a televisit

## 2023-03-06 NOTE — Telephone Encounter (Signed)
Talked with the patient via phone. He reports all respiratory symptoms have cleared. He reports that he began to experience blisters on the inside of his bottom lip about 3 days ago as well as a rash on his face with small raised areas with a white top. He denies cardiopulmonary or gastrointestinal symptoms. I encouraged him to visit his PCP to have someone look at this/these rashes. He agreed

## 2023-03-06 NOTE — Telephone Encounter (Signed)
Patient called back for results.

## 2023-03-07 DIAGNOSIS — L239 Allergic contact dermatitis, unspecified cause: Secondary | ICD-10-CM | POA: Diagnosis not present

## 2023-03-07 DIAGNOSIS — K120 Recurrent oral aphthae: Secondary | ICD-10-CM | POA: Diagnosis not present

## 2023-03-07 DIAGNOSIS — K121 Other forms of stomatitis: Secondary | ICD-10-CM | POA: Diagnosis not present

## 2023-03-07 DIAGNOSIS — I129 Hypertensive chronic kidney disease with stage 1 through stage 4 chronic kidney disease, or unspecified chronic kidney disease: Secondary | ICD-10-CM | POA: Diagnosis not present

## 2023-03-07 DIAGNOSIS — K1379 Other lesions of oral mucosa: Secondary | ICD-10-CM | POA: Diagnosis not present

## 2023-03-07 NOTE — Telephone Encounter (Signed)
Patient states he had blisters on the inside of his bottom lip and a rash on his face around 6/14 . 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 He would like it noted that he  is allergic to azithromycin.

## 2023-03-08 NOTE — Telephone Encounter (Signed)
Patient would like a call back, he states he has some things to discuss with her.

## 2023-03-14 ENCOUNTER — Ambulatory Visit: Payer: Medicare PPO | Admitting: Allergy and Immunology

## 2023-03-14 ENCOUNTER — Other Ambulatory Visit: Payer: Self-pay

## 2023-03-14 MED ORDER — ESOMEPRAZOLE MAGNESIUM 20 MG PO CPDR: 20.0000 mg | DELAYED_RELEASE_CAPSULE | Freq: Every day | ORAL | 1 refills | Status: DC

## 2023-03-20 ENCOUNTER — Ambulatory Visit (HOSPITAL_BASED_OUTPATIENT_CLINIC_OR_DEPARTMENT_OTHER): Payer: Medicare PPO | Admitting: Family

## 2023-03-20 ENCOUNTER — Telehealth: Payer: Self-pay | Admitting: Cardiovascular Disease

## 2023-03-20 ENCOUNTER — Ambulatory Visit (INDEPENDENT_AMBULATORY_CARE_PROVIDER_SITE_OTHER): Payer: Medicare PPO

## 2023-03-20 ENCOUNTER — Telehealth (HOSPITAL_BASED_OUTPATIENT_CLINIC_OR_DEPARTMENT_OTHER): Payer: Self-pay

## 2023-03-20 ENCOUNTER — Encounter (HOSPITAL_BASED_OUTPATIENT_CLINIC_OR_DEPARTMENT_OTHER): Payer: Self-pay | Admitting: Family

## 2023-03-20 VITALS — BP 142/82 | HR 76 | Ht 67.0 in | Wt 198.0 lb

## 2023-03-20 DIAGNOSIS — J4550 Severe persistent asthma, uncomplicated: Secondary | ICD-10-CM | POA: Diagnosis not present

## 2023-03-20 DIAGNOSIS — I129 Hypertensive chronic kidney disease with stage 1 through stage 4 chronic kidney disease, or unspecified chronic kidney disease: Secondary | ICD-10-CM | POA: Diagnosis not present

## 2023-03-20 DIAGNOSIS — I25118 Atherosclerotic heart disease of native coronary artery with other forms of angina pectoris: Secondary | ICD-10-CM

## 2023-03-20 DIAGNOSIS — J449 Chronic obstructive pulmonary disease, unspecified: Secondary | ICD-10-CM | POA: Diagnosis not present

## 2023-03-20 DIAGNOSIS — I472 Ventricular tachycardia, unspecified: Secondary | ICD-10-CM | POA: Diagnosis not present

## 2023-03-20 DIAGNOSIS — R42 Dizziness and giddiness: Secondary | ICD-10-CM | POA: Diagnosis not present

## 2023-03-20 DIAGNOSIS — I4729 Other ventricular tachycardia: Secondary | ICD-10-CM | POA: Diagnosis not present

## 2023-03-20 DIAGNOSIS — R531 Weakness: Secondary | ICD-10-CM | POA: Diagnosis not present

## 2023-03-20 DIAGNOSIS — N1831 Chronic kidney disease, stage 3a: Secondary | ICD-10-CM | POA: Diagnosis not present

## 2023-03-20 DIAGNOSIS — E785 Hyperlipidemia, unspecified: Secondary | ICD-10-CM | POA: Diagnosis not present

## 2023-03-20 DIAGNOSIS — I471 Supraventricular tachycardia, unspecified: Secondary | ICD-10-CM

## 2023-03-20 DIAGNOSIS — G219 Secondary parkinsonism, unspecified: Secondary | ICD-10-CM | POA: Diagnosis not present

## 2023-03-20 DIAGNOSIS — Z87898 Personal history of other specified conditions: Secondary | ICD-10-CM | POA: Diagnosis not present

## 2023-03-20 DIAGNOSIS — E875 Hyperkalemia: Secondary | ICD-10-CM | POA: Diagnosis not present

## 2023-03-20 DIAGNOSIS — K120 Recurrent oral aphthae: Secondary | ICD-10-CM | POA: Diagnosis not present

## 2023-03-20 DIAGNOSIS — I6523 Occlusion and stenosis of bilateral carotid arteries: Secondary | ICD-10-CM

## 2023-03-20 DIAGNOSIS — R051 Acute cough: Secondary | ICD-10-CM | POA: Diagnosis not present

## 2023-03-20 MED ORDER — METOPROLOL SUCCINATE ER 25 MG PO TB24: 25.0000 mg | ORAL_TABLET | Freq: Every day | ORAL | 3 refills | Status: DC

## 2023-03-20 MED ORDER — AMLODIPINE BESYLATE 5 MG PO TABS: 5.0000 mg | ORAL_TABLET | Freq: Every day | ORAL | 3 refills | Status: DC

## 2023-03-20 NOTE — Telephone Encounter (Signed)
Patient returned call, transferred from call center. Heart monitor results reviewed with patient. He will come today at 1120 to follow up.      ----- Message from Alver Sorrow, NP sent at 03/20/2023  7:50 AM EDT ----- Monitor with predominantly normal sinus rhythm.  There were a few runs of a fast heart rhythm both in the top (SVT) and bottom chambers (VT) of the heart. One episode of VT was 19 beats which is longer than expected.    Recommend office visit to discuss.   Note to clinical team - Dr. Allyson Sabal is his primary cardiologist and has OV 7/30 if he would prefer to see him. Otherwise okay for APP.

## 2023-03-20 NOTE — Patient Instructions (Addendum)
Medication Instructions:  Your physician has recommended you make the following change in your medication:   Start: Amlodipine 5mg  daily   Start: Metoprolol Succinate 25mg  daily  *If you need a refill on your cardiac medications before your next appointment, please call your pharmacy*   Lab Work: Your physician recommends that you return for lab work today- Bmp, MAG and direct LDL   If you have labs (blood work) drawn today and your tests are completely normal, you will receive your results only by: MyChart Message (if you have MyChart) OR A paper copy in the mail If you have any lab test that is abnormal or we need to change your treatment, we will call you to review the results.   Testing/Procedures: Your physician has requested that you have an echocardiogram. Echocardiography is a painless test that uses sound waves to create images of your heart. It provides your doctor with information about the size and shape of your heart and how well your heart's chambers and valves are working. This procedure takes approximately one hour. There are no restrictions for this procedure. Please do NOT wear cologne, perfume, aftershave, or lotions (deodorant is allowed). Please arrive 15 minutes prior to your appointment time.  Follow-Up: At [facility], you and your health needs are our priority.  As part of our continuing mission to provide you with exceptional heart care, we have created designated Provider Care Teams.  These Care Teams include your primary Cardiologist (physician) and Advanced Practice Providers (APPs -  Physician Assistants and Nurse Practitioners) who all work together to provide you with the care you need, when you need it.  We recommend signing up for the patient portal called "MyChart".  Sign up information is provided on this After Visit Summary.  MyChart is used to connect with patients for Virtual Visits (Telemedicine).  Patients are able to view lab/test results,  encounter notes, upcoming appointments, etc.  Non-urgent messages can be sent to your provider as well.   To learn more about what you can do with MyChart, go to [email or web address].    Your next appointment:   Follow up as scheduled

## 2023-03-20 NOTE — Telephone Encounter (Signed)
Call goes straight to VM.  LVM to call office

## 2023-03-20 NOTE — Progress Notes (Signed)
Cardiology Office Note:  .   Date:  03/20/2023  ID:  [name], DOB 03/13/1951, MRN [id number] PCP: Geoffry Paradise, MD  [facility] Providers Cardiologist:  Nanetta Batty, MD    History of Present Illness: .   Jared [name] is a 72 y.o. male hx of CAD (2020 LHC with stent-LCA 2013 LHC patent stent and improved 30% LM stenosis), hypertension, hyperlipidemia with statin intolerance (Lipitor, Crestor) last seen 02/17/2023.  Last seen 02/17/2023 for syncope and episodes of lightheadedness onset 6 weeks prior.  That previous Sunday got home from church fell in the kitchen and blacked out without warning. Loss of consciousness or a couple seconds witnessed by his wife.  He was orthostatic in clinic (BP lying 115/74 ?  sitting 89/58 sitting ? standing 98/63) and telmisartan, amlodipine were held.  Live ZIO monitor was placed.    Monitor was completed as two monitors due to adherence issue.  Probably normal sinus rhythm with average heart rate of 79 bpm and 85 bpm.  Total of 13 SVT runs with fastest/longest 37.2 seconds with max rate 190 bpm.  2 episodes of VT the fastest lasting 19 beats at 156 bpm.  PVC burden 1.6%.  Episodes were associated with sinus rhythm or occasional PVC/PAC.  SVT, VT episodes were not triggered.  19 beat run of VT occurred 02/25/2023 at 7:45 AM.  Fastest/longest episode of VT 02/28/2023 at 8:01 PM. Neither were triggered episode.   Today for follow-up with his wife. We reviewed monitor in detail.  Overall has been feeling less lightheaded.  However, recently went on a trip to [place] and on the drive home felt very cold in the car despite reducing EC.  Upon getting home he walked to the bathroom and on his walk back to bed felt profoundly weak, sweaty and his wife had to assist him to the bed.  Not associate with shortness of breath or fever nor GI symptoms.  He did have reaction to azithromycin with ulcers in his mouth and wonders if that could have been  contributory.  Has been checking BP periodically at home with readings elevated ranging 148/82 to 195/94.  BP cuff not previously checked for accuracy.  ROS: Please see the history of present illness.    All other systems reviewed and are negative.   Studies Reviewed: .        Cardiac Studies & Procedures     STRESS TESTS  NM MYOCAR MULTI W/SPECT W 09/24/2013   ECHOCARDIOGRAM  ECHOCARDIOGRAM COMPLETE 04/06/2022  Narrative ECHOCARDIOGRAM REPORT    Patient Name:   Jared [name] Date of Exam: 04/06/2022 Medical Rec #:  [id number]         Height:       67.0 in Accession #:    [id number]        Weight:       190.0 lb Date of Birth:  06/30/1951         BSA:          1.979 m Patient Age:    71 years          BP:           122/78 mmHg Patient Gender: M                 HR:           70  bpm. Exam Location:  Outpatient  Procedure: 2D Echo, Cardiac Doppler, Color Doppler and 3D Echo  Indications:  Fatigue R53.83  History:        Patient has prior history of Echocardiogram examinations, most recent 02/19/2020. CAD; Risk Factors:Hypertension and Dyslipidemia.  Sonographer:    Thurman Coyer RDCS Referring Phys: [id number] JESSE M CLEAVER  IMPRESSIONS   1. Left ventricular ejection fraction by 3D volume is 64 %. The left ventricle has normal function. The left ventricle has no regional wall motion abnormalities. There is mild concentric left ventricular hypertrophy. Left ventricular diastolic parameters were normal. 2. Right ventricular systolic function is normal. The right ventricular size is normal. There is normal pulmonary artery systolic pressure. 3. The mitral valve is normal in structure. Trivial mitral valve regurgitation. No evidence of mitral stenosis. 4. The aortic valve has an indeterminant number of cusps. Aortic valve regurgitation is mild. No aortic stenosis is present. 5. The inferior vena cava is normal in size with greater than 50% respiratory variability,  suggesting right atrial pressure of 3 mmHg.  FINDINGS Left Ventricle: Left ventricular ejection fraction by 3D volume is 64 %. The left ventricle has normal function. The left ventricle has no regional wall motion abnormalities. The left ventricular internal cavity size was normal in size. There is mild concentric left ventricular hypertrophy. Left ventricular diastolic parameters were normal.  Right Ventricle: The right ventricular size is normal. No increase in right ventricular wall thickness. Right ventricular systolic function is normal. There is normal pulmonary artery systolic pressure. The tricuspid regurgitant velocity is 2.23 m/s, and with an assumed right atrial pressure of 3 mmHg, the estimated right ventricular systolic pressure is 22.9 mmHg.  Left Atrium: Left atrial size was normal in size.  Right Atrium: Right atrial size was normal in size.  Pericardium: There is no evidence of pericardial effusion. Presence of epicardial fat layer.  Mitral Valve: The mitral valve is normal in structure. Mild mitral annular calcification. Trivial mitral valve regurgitation. No evidence of mitral valve stenosis.  Tricuspid Valve: The tricuspid valve is normal in structure. Tricuspid valve regurgitation is not demonstrated. No evidence of tricuspid stenosis.  Aortic Valve: The aortic valve has an indeterminant number of cusps. Aortic valve regurgitation is mild. Aortic regurgitation PHT measures 390 msec. No aortic stenosis is present.  Pulmonic Valve: The pulmonic valve was not well visualized. Pulmonic valve regurgitation is not visualized. No evidence of pulmonic stenosis.  Aorta: The aortic root is normal in size and structure.  Venous: The inferior vena cava is normal in size with greater than 50% respiratory variability, suggesting right atrial pressure of 3 mmHg.  IAS/Shunts: No atrial level shunt detected by color flow Doppler.   LEFT VENTRICLE PLAX 2D LVIDd:         5.10 cm          Diastology LVIDs:         3.30 cm         LV e' medial:    6.66 cm/s LV PW:         1.20 cm         LV E/e' medial:  16.4 LV IVS:        1.10 cm         LV e' lateral:   8.33 cm/s LVOT diam:     2.30 cm         LV E/e' lateral: 13.1 LV SV:         119 LV SV Index:   60 LVOT Area:     4.15 cm  3D Volume EF LV 3D EF:    Left ventricul ar ejection fraction by 3D volume is 64 %.  3D Volume EF: 3D EF:        64 % LV EDV:       124 ml LV ESV:       44 ml LV SV:        80 ml  RIGHT VENTRICLE RV Basal diam:  3.30 cm RV Mid diam:    2.20 cm RV S prime:     11.00 cm/s TAPSE (M-mode): 1.6 cm  LEFT ATRIUM             Index        RIGHT ATRIUM           Index LA diam:        3.80 cm 1.92 cm/m   RA Area:     10.70 cm LA Vol (A2C):   47.7 ml 24.11 ml/m  RA Volume:   19.60 ml  9.91 ml/m LA Vol (A4C):   66.4 ml 33.56 ml/m LA Biplane Vol: 57.8 ml 29.21 ml/m AORTIC VALVE LVOT Vmax:   134.00 cm/s LVOT Vmean:  89.500 cm/s LVOT VTI:    0.286 m AI PHT:      390 msec  AORTA Ao Root diam: 3.50 cm Ao Asc diam:  3.40 cm  MITRAL VALVE                TRICUSPID VALVE MV Area (PHT): 3.54 cm     TR Peak grad:   19.9 mmHg MV Decel Time: 214 msec     TR Vmax:        223.00 cm/s MV E velocity: 109.00 cm/s MV A velocity: 117.00 cm/s  SHUNTS MV E/A ratio:  0.93         Systemic VTI:  0.29 m Systemic Diam: 2.30 cm  Kardie Tobb DO Electronically signed by Thomasene Ripple DO Signature Date/Time: 04/06/2022/4:15:39 PM    Final    MONITORS  LONG TERM MONITOR-LIVE TELEMETRY (3-14 DAYS) 03/17/2023  Narrative Patch Wear Time:  13 days and 11 hours (2024-06-03T15:05:56-0400 to 2024-06-18T10:35:49-0400)  Monitor 1 Patient had a min HR of 59 bpm, max HR of 113 bpm, and avg HR of 79 bpm. Predominant underlying rhythm was Sinus Rhythm. 1 run of Supraventricular Tachycardia occurred lasting 4 beats with a max rate of 109 bpm (avg 107 bpm). Isolated SVEs were rare (<1.0%), SVE Triplets  were rare (<1.0%), and no SVE Couplets were present. Isolated VEs were occasional (1.6%, 879), VE Couplets were rare (<1.0%, 19), and no VE Triplets were present. Ventricular Trigeminy was present.  Monitor 2 Patient had a min HR of 56 bpm, max HR of 190 bpm, and avg HR of 85 bpm. Predominant underlying rhythm was Sinus Rhythm. 2 Ventricular Tachycardia runs occurred, the run with the fastest interval lasting 19 beats with a max rate of 156 bpm (avg 141 bpm); the run with the fastest interval was also the longest. 12 Supraventricular Tachycardia runs occurred, the run with the fastest interval lasting 37.2 secs with a max rate of 190 bpm (avg 125 bpm); the run with the fastest interval was also the longest. Isolated SVEs were rare (<1.0%), SVE Couplets were rare (<1.0%), and SVE Triplets were rare (<1.0%). Isolated VEs were occasional (1.4%, [id number]), VE Couplets were rare (<1.0%, 392), and VE Triplets were rare (<1.0%, 6). Ventricular Bigeminy was present. Inverted QRS complexes possibly due to inverted placement of device.  SR/SB/ST Freq  PVCs (1.6% burden) Short runs of SVT and longer runs of NSVT ROV to discuss           Risk Assessment/Calculations:     HYPERTENSION CONTROL Vitals:   03/20/23 1133 03/20/23 1156  BP: (!) 140/82 (!) 142/82    The patient's blood pressure is elevated above target today.  In order to address the patient's elevated BP: A current anti-hypertensive medication was adjusted today.; Follow up with general cardiology has been recommended.          Physical Exam:   VS:  BP (!) 142/82   Pulse 76   Ht 5\' 7"  (1.702 m)   Wt 198 lb (89.8 kg)   BMI 31.01 kg/m    Wt Readings from Last 3 Encounters:  03/20/23 198 lb (89.8 kg)  02/21/23 197 lb 11.2 oz (89.7 kg)  02/20/23 196 lb (88.9 kg)    GEN: Well nourished, well developed in no acute distress NECK: No JVD; No carotid bruits CARDIAC: RRR, no murmurs, rubs, gallops RESPIRATORY:  Clear to auscultation  without rales, wheezing or rhonchi  ABDOMEN: Soft, non-tender, non-distended EXTREMITIES:  No edema; No deformity. Tremor.   ASSESSMENT AND PLAN: .    Lightheadedness/syncope/VT/SVT- Lightheadedness has been improved but did have episode of profound weakness of unclear cause last Friday. No recurrent syncope (last syncopal spell 01/2023) - reiterated no driving x 6 months per [place] DMV recommendations to which he is agreeable. Monitor 02/2023 with one 19 beat run of VT which was asymptomatic and 13 runs of asymptomatic SVT (longest/fastest 37.2 sec at 190 bpm). Some concern tachyarrhythmias could be contributory to lightheadedness, plan to utilize cardioselective beta blocker due to asthma hx - start Toprol 25mg  every day. Plan for echocardiogram to rule out structural abnormalities as contributory to arrhythmia. Pending echo result and response to Toprol consider EP referral at follow up if indicated.    HLD, LDL goal send 70-intolerant to atorvastatin, rosuvastatin.  Continue Repatha.03/2022 LDL 136. Update direct LDL today.    CAD-s/p remote dominant RCA stenting by Dr. Clarene Duke in 2000.  Cardiac catheterization 2011 revealing widely patent stent with 30% left main stenosis which was improved from prior. GDMT Aspirin, Repatha, Imdur. Stable with no anginal symptoms. No indication for ischemic evaluation.    Hyperkalemia / Leg cramps - 02/17/23 K 5.3 and has not yet had repeat labs as ordered at that time. Reports bilateral leg cramps. BMP, magnesium today.    Hypertension / Orthostatic hypotension -amlodipine and telmisartan previously held for orthostatic hypotension.  Initial BP 140/82 with repeat 142/82.  Home BP 148/82 -195/94.  He sees primary care later this afternoon and was encouraged to take cuff to their office to ensure accuracy.  Due to persistently elevated BP, we will resume Amlodipine 5mg  every day. Defer resuming Telmisartan due to recent hyperkalemia, asa bove. Continue Imdur 30 mg daily.  Discussed to monitor BP at home at least 2 hours after medications and sitting for 5-10 minutes.   Carotid stenosis - Duplex 08/22/22 RICA 40-59%, LICA 1-39%. Consider repeat duplex 08/2023. Continue lipid control with Repatha.        Dispo: follow up in August as scheduled  Signed, Alver Sorrow, NP

## 2023-03-20 NOTE — Telephone Encounter (Signed)
Returned pt call from this morning.  He had OV today with Gillian Shields, NP at [facility] pertaining to symptoms.  Advised pt and his wife to call the clinic if they have any questions.

## 2023-03-20 NOTE — Telephone Encounter (Signed)
Pt c/o BP issue: STAT if pt c/o blurred vision, one-sided weakness or slurred speech  1. What are your last 5 BP readings?   6/29 and 6/30 195/94  HR 79 186/88  HR 76 182/83  HR 74 161/96  HR 75 154/88  HR  72  2. Are you having any other symptoms (ex. Dizziness, headache, blurred vision, passed out)?   Real mild headaches which do not last very long.  Patient states he is not stable on his feet but not dizzy  3. What is your BP issue?   Patient is concerned about his high blood pressure reading.

## 2023-03-20 NOTE — Telephone Encounter (Addendum)
Left message for patient to call back    ----- Message from Alver Sorrow, NP sent at 03/20/2023  7:50 AM EDT ----- Monitor with predominantly normal sinus rhythm.  There were a few runs of a fast heart rhythm both in the top (SVT) and bottom chambers (VT) of the heart. One episode of VT was 19 beats which is longer than expected.   Recommend office visit to discuss.  Note to clinical team - Dr. Allyson Sabal is his primary cardiologist and has OV 7/30 if he would prefer to see him. Otherwise okay for APP.

## 2023-03-21 ENCOUNTER — Telehealth (HOSPITAL_BASED_OUTPATIENT_CLINIC_OR_DEPARTMENT_OTHER): Payer: Self-pay

## 2023-03-21 DIAGNOSIS — L309 Dermatitis, unspecified: Secondary | ICD-10-CM | POA: Diagnosis not present

## 2023-03-21 DIAGNOSIS — K121 Other forms of stomatitis: Secondary | ICD-10-CM | POA: Diagnosis not present

## 2023-03-21 LAB — BASIC METABOLIC PANEL
BUN/Creatinine Ratio: 20 (ref 10–24)
BUN: 26 mg/dL (ref 8–27)
CO2: 21 mmol/L (ref 20–29)
Calcium: 8.9 mg/dL (ref 8.6–10.2)
Chloride: 100 mmol/L (ref 96–106)
Creatinine, Ser: 1.27 mg/dL (ref 0.76–1.27)
Glucose: 79 mg/dL (ref 70–99)
Potassium: 4.6 mmol/L (ref 3.5–5.2)
Sodium: 137 mmol/L (ref 134–144)
eGFR: 59 mL/min/{1.73_m2} — ABNORMAL LOW (ref >59)

## 2023-03-21 LAB — LDL CHOLESTEROL, DIRECT: LDL Direct: 127 mg/dL — ABNORMAL HIGH (ref 0–99)

## 2023-03-21 LAB — MAGNESIUM: Magnesium: 2.2 mg/dL (ref 1.6–2.3)

## 2023-03-21 NOTE — Telephone Encounter (Addendum)
Left message for patient to call back    ----- Message from Alver Sorrow, NP sent at 03/21/2023  8:29 AM EDT ----- Kidney function improved from previous.  Normal electrolytes including potassium and magnesium.  LDL (bad cholesterol) of 127 which is above goal of less than 70.  Please ensure no missed doses of Repatha.

## 2023-03-21 NOTE — Telephone Encounter (Signed)
Patient returned call, transferred from call center. Lab results reviewed with patient, patient did miss some doses but will get back on track. NP notified.      ----- Message from Alver Sorrow, NP sent at 03/21/2023  8:29 AM EDT ----- Kidney function improved from previous.  Normal electrolytes including potassium and magnesium.  LDL (bad cholesterol) of 127 which is above goal of less than 70.  Please ensure no missed doses of Repatha.

## 2023-03-28 DIAGNOSIS — R3912 Poor urinary stream: Secondary | ICD-10-CM | POA: Diagnosis not present

## 2023-03-28 DIAGNOSIS — D485 Neoplasm of uncertain behavior of skin: Secondary | ICD-10-CM | POA: Diagnosis not present

## 2023-03-28 DIAGNOSIS — K140 Glossitis: Secondary | ICD-10-CM | POA: Diagnosis not present

## 2023-03-28 DIAGNOSIS — R3915 Urgency of urination: Secondary | ICD-10-CM | POA: Diagnosis not present

## 2023-03-28 DIAGNOSIS — N401 Enlarged prostate with lower urinary tract symptoms: Secondary | ICD-10-CM | POA: Diagnosis not present

## 2023-03-28 DIAGNOSIS — R350 Frequency of micturition: Secondary | ICD-10-CM | POA: Diagnosis not present

## 2023-04-03 ENCOUNTER — Telehealth: Payer: Self-pay | Admitting: Cardiovascular Disease

## 2023-04-03 DIAGNOSIS — R350 Frequency of micturition: Secondary | ICD-10-CM | POA: Diagnosis not present

## 2023-04-03 DIAGNOSIS — N401 Enlarged prostate with lower urinary tract symptoms: Secondary | ICD-10-CM | POA: Diagnosis not present

## 2023-04-03 NOTE — Telephone Encounter (Signed)
Patient returned call to office, transferred from call center  Patient states for the last couple of weeks he has been getting really hot to the point that his clothes get wet. Headaches across the front of his head, very weak, lightheaded, some dizziness. He states he is also getting short of breath. For the last week has  been off and on. 130s/70s pretty consistently. No changes to meds, diet and no travel. He states when he gets this way, he sits down and turns the ceiling fan on until he cools down. Denies chest pain.

## 2023-04-03 NOTE — Telephone Encounter (Signed)
Returned call to patient, no answer, left message to call back!  

## 2023-04-03 NOTE — Telephone Encounter (Signed)
No evidence of hypotension that would be contributory to symptoms. He has echocardiogram upcoming for further evaluation if heart muscle or valvular abnormality contributory to weakness, dizziness. However, this would not explain heat changes, headache, sweating. Would recommend he proceed with echo as scheduled and also reach out to PCP for further evaluation.   Alver Sorrow, NP

## 2023-04-03 NOTE — Telephone Encounter (Signed)
Left message for patient to call back     "No evidence of hypotension that would be contributory to symptoms. He has echocardiogram upcoming for further evaluation if heart muscle or valvular abnormality contributory to weakness, dizziness. However, this would not explain heat changes, headache, sweating. Would recommend he proceed with echo as scheduled and also reach out to PCP for further evaluation.    Alver Sorrow, NP"

## 2023-04-03 NOTE — Telephone Encounter (Signed)
Patient returned call to office, transferred from call center. Reviewed recommendations from provider with patient. Patient verbalizes understanding and will keep echo as scheduled.

## 2023-04-03 NOTE — Telephone Encounter (Signed)
Pt c/o Shortness Of Breath: STAT if SOB developed within the last 24 hours or pt is noticeably SOB on the phone  1. Are you currently SOB (can you hear that pt is SOB on the phone)? No   2. How long have you been experiencing SOB? Since Friday   3. Are you SOB when sitting or when up moving around? Both   4. Are you currently experiencing any other symptoms? Episodes of getting really hot to the point his clothes are wet for 30 mins to an hour, headache, lightheadedness, & dizziness  Last episode occurred last night for about an hour. Patient is requesting to speak with Rutherford Guys in regards to this.

## 2023-04-04 DIAGNOSIS — K140 Glossitis: Secondary | ICD-10-CM | POA: Diagnosis not present

## 2023-04-05 ENCOUNTER — Ambulatory Visit: Payer: Medicare PPO | Admitting: Neurology

## 2023-04-13 ENCOUNTER — Ambulatory Visit (HOSPITAL_COMMUNITY): Payer: Medicare PPO | Attending: Family

## 2023-04-13 DIAGNOSIS — I351 Nonrheumatic aortic (valve) insufficiency: Secondary | ICD-10-CM | POA: Insufficient documentation

## 2023-04-13 DIAGNOSIS — I4729 Other ventricular tachycardia: Secondary | ICD-10-CM | POA: Diagnosis not present

## 2023-04-13 DIAGNOSIS — E785 Hyperlipidemia, unspecified: Secondary | ICD-10-CM | POA: Insufficient documentation

## 2023-04-13 DIAGNOSIS — Z8249 Family history of ischemic heart disease and other diseases of the circulatory system: Secondary | ICD-10-CM | POA: Diagnosis not present

## 2023-04-13 DIAGNOSIS — I10 Essential (primary) hypertension: Secondary | ICD-10-CM | POA: Insufficient documentation

## 2023-04-13 DIAGNOSIS — R42 Dizziness and giddiness: Secondary | ICD-10-CM | POA: Insufficient documentation

## 2023-04-13 DIAGNOSIS — I252 Old myocardial infarction: Secondary | ICD-10-CM | POA: Insufficient documentation

## 2023-04-13 DIAGNOSIS — I2510 Atherosclerotic heart disease of native coronary artery without angina pectoris: Secondary | ICD-10-CM | POA: Insufficient documentation

## 2023-04-13 DIAGNOSIS — G4730 Sleep apnea, unspecified: Secondary | ICD-10-CM | POA: Diagnosis not present

## 2023-04-13 LAB — ECHOCARDIOGRAM COMPLETE
Area-P 1/2: 2.9 cm2
P 1/2 time: 485 msec
S' Lateral: 2.9 cm

## 2023-04-14 ENCOUNTER — Telehealth (HOSPITAL_BASED_OUTPATIENT_CLINIC_OR_DEPARTMENT_OTHER): Payer: Self-pay

## 2023-04-14 ENCOUNTER — Telehealth: Payer: Self-pay | Admitting: Cardiovascular Disease

## 2023-04-14 NOTE — Telephone Encounter (Signed)
Pt returning nurses phone call. Please advise ?

## 2023-04-14 NOTE — Telephone Encounter (Signed)
Returned call to patient, answered questions related to driving.

## 2023-04-14 NOTE — Telephone Encounter (Signed)
Patient returned call, transferred from call center.   Results called to patient who verbalizes understanding!

## 2023-04-14 NOTE — Telephone Encounter (Addendum)
Left message for patient to call back     ----- Message from Alver Sorrow sent at 04/13/2023  8:23 PM EDT ----- Echo with normal heart pumping function. Mild leaking of aortic valve which we prevent from worsening by keeping BP well controlled. This is stable from previous. Good result!

## 2023-04-17 ENCOUNTER — Ambulatory Visit: Payer: Medicare PPO

## 2023-04-17 DIAGNOSIS — J4550 Severe persistent asthma, uncomplicated: Secondary | ICD-10-CM

## 2023-04-19 DIAGNOSIS — E349 Endocrine disorder, unspecified: Secondary | ICD-10-CM | POA: Diagnosis not present

## 2023-04-24 DIAGNOSIS — E291 Testicular hypofunction: Secondary | ICD-10-CM | POA: Diagnosis not present

## 2023-04-24 DIAGNOSIS — I129 Hypertensive chronic kidney disease with stage 1 through stage 4 chronic kidney disease, or unspecified chronic kidney disease: Secondary | ICD-10-CM | POA: Diagnosis not present

## 2023-04-24 DIAGNOSIS — Z1212 Encounter for screening for malignant neoplasm of rectum: Secondary | ICD-10-CM | POA: Diagnosis not present

## 2023-04-24 DIAGNOSIS — E785 Hyperlipidemia, unspecified: Secondary | ICD-10-CM | POA: Diagnosis not present

## 2023-04-24 DIAGNOSIS — N1831 Chronic kidney disease, stage 3a: Secondary | ICD-10-CM | POA: Diagnosis not present

## 2023-04-24 DIAGNOSIS — E039 Hypothyroidism, unspecified: Secondary | ICD-10-CM | POA: Diagnosis not present

## 2023-04-24 DIAGNOSIS — R739 Hyperglycemia, unspecified: Secondary | ICD-10-CM | POA: Diagnosis not present

## 2023-04-24 DIAGNOSIS — N401 Enlarged prostate with lower urinary tract symptoms: Secondary | ICD-10-CM | POA: Diagnosis not present

## 2023-05-01 DIAGNOSIS — E039 Hypothyroidism, unspecified: Secondary | ICD-10-CM | POA: Diagnosis not present

## 2023-05-01 DIAGNOSIS — R82998 Other abnormal findings in urine: Secondary | ICD-10-CM | POA: Diagnosis not present

## 2023-05-01 DIAGNOSIS — E291 Testicular hypofunction: Secondary | ICD-10-CM | POA: Diagnosis not present

## 2023-05-01 DIAGNOSIS — C61 Malignant neoplasm of prostate: Secondary | ICD-10-CM | POA: Diagnosis not present

## 2023-05-01 DIAGNOSIS — Z0000 Encounter for general adult medical examination without abnormal findings: Secondary | ICD-10-CM | POA: Diagnosis not present

## 2023-05-01 DIAGNOSIS — G20A1 Parkinson's disease without dyskinesia, without mention of fluctuations: Secondary | ICD-10-CM | POA: Diagnosis not present

## 2023-05-01 DIAGNOSIS — N1831 Chronic kidney disease, stage 3a: Secondary | ICD-10-CM | POA: Diagnosis not present

## 2023-05-01 DIAGNOSIS — Z1212 Encounter for screening for malignant neoplasm of rectum: Secondary | ICD-10-CM | POA: Diagnosis not present

## 2023-05-01 DIAGNOSIS — J449 Chronic obstructive pulmonary disease, unspecified: Secondary | ICD-10-CM | POA: Diagnosis not present

## 2023-05-01 DIAGNOSIS — E119 Type 2 diabetes mellitus without complications: Secondary | ICD-10-CM | POA: Diagnosis not present

## 2023-05-01 DIAGNOSIS — E785 Hyperlipidemia, unspecified: Secondary | ICD-10-CM | POA: Diagnosis not present

## 2023-05-01 DIAGNOSIS — I2510 Atherosclerotic heart disease of native coronary artery without angina pectoris: Secondary | ICD-10-CM | POA: Diagnosis not present

## 2023-05-02 NOTE — Progress Notes (Unsigned)
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 extremities. Continue Repatha High-fiber diet Repeat carotid Dopplers  Dizziness/lightheadedness-contacted nurse triage line on 04/03/2023.  Noted an episode of headache, lightheadedness, dizziness, and sweating that lasted for 30 minutes to an hour-echocardiogram reassuring.  Does not appear to be cardiac related. Will repeat carotid Dopplers   Disposition: Follow-up with Dr. Allyson Sabal or me in 4-6 months   Thomasene Ripple. Hillari Zumwalt NP-C     05/11/2023, 4:07 PM [facility] [address] Office [phone] Fax [phone]  Notice: This dictation was prepared with Dragon dictation along with smaller phrase technology. Any transcriptional  errors that result from this process are unintentional and may not be corrected upon review.  I spent 14 minutes examining this patient, reviewing medications, and using patient centered shared decision making involving her cardiac care.  Prior to her visit I spent greater than 20 minutes reviewing her past medical history,  medications, and prior cardiac tests.

## 2023-05-03 DIAGNOSIS — D2261 Melanocytic nevi of right upper limb, including shoulder: Secondary | ICD-10-CM | POA: Diagnosis not present

## 2023-05-03 DIAGNOSIS — L821 Other seborrheic keratosis: Secondary | ICD-10-CM | POA: Diagnosis not present

## 2023-05-03 DIAGNOSIS — Z85820 Personal history of malignant melanoma of skin: Secondary | ICD-10-CM | POA: Diagnosis not present

## 2023-05-03 DIAGNOSIS — L720 Epidermal cyst: Secondary | ICD-10-CM | POA: Diagnosis not present

## 2023-05-03 DIAGNOSIS — L570 Actinic keratosis: Secondary | ICD-10-CM | POA: Diagnosis not present

## 2023-05-03 DIAGNOSIS — L814 Other melanin hyperpigmentation: Secondary | ICD-10-CM | POA: Diagnosis not present

## 2023-05-03 DIAGNOSIS — L578 Other skin changes due to chronic exposure to nonionizing radiation: Secondary | ICD-10-CM | POA: Diagnosis not present

## 2023-05-03 DIAGNOSIS — D485 Neoplasm of uncertain behavior of skin: Secondary | ICD-10-CM | POA: Diagnosis not present

## 2023-05-03 DIAGNOSIS — C44529 Squamous cell carcinoma of skin of other part of trunk: Secondary | ICD-10-CM | POA: Diagnosis not present

## 2023-05-03 DIAGNOSIS — Z85828 Personal history of other malignant neoplasm of skin: Secondary | ICD-10-CM | POA: Diagnosis not present

## 2023-05-08 ENCOUNTER — Ambulatory Visit: Payer: Medicare PPO | Admitting: Podiatry

## 2023-05-08 ENCOUNTER — Encounter: Payer: Self-pay | Admitting: Podiatry

## 2023-05-08 DIAGNOSIS — L603 Nail dystrophy: Secondary | ICD-10-CM | POA: Diagnosis not present

## 2023-05-10 NOTE — Progress Notes (Signed)
Subjective:   Patient ID: Jared [name], male   DOB: 72 y.o.   MRN: [id number]   HPI Patient presents concerned about discoloration left second toenail states that it is not hurting and he may have injured it   ROS      Objective:  Physical Exam  No neurovascular status intact with the patient found to have inflammation of the left second toenail localized no proximal edema erythema drainage noted probability for     Assessment:  For damage to the left second nail secondary to trauma     Plan:  H&P reviewed discussed nail removal do not recommend it at this point but may be necessary in future and allow it to regrow it may come off at 1 point in future

## 2023-05-11 ENCOUNTER — Encounter: Payer: Self-pay | Admitting: General Practice

## 2023-05-11 ENCOUNTER — Ambulatory Visit: Payer: Medicare PPO | Attending: General Practice | Admitting: General Practice

## 2023-05-11 VITALS — BP 110/64 | HR 82 | Ht 67.0 in | Wt 210.0 lb

## 2023-05-11 DIAGNOSIS — Z87898 Personal history of other specified conditions: Secondary | ICD-10-CM

## 2023-05-11 DIAGNOSIS — I471 Supraventricular tachycardia, unspecified: Secondary | ICD-10-CM | POA: Diagnosis not present

## 2023-05-11 DIAGNOSIS — R0989 Other specified symptoms and signs involving the circulatory and respiratory systems: Secondary | ICD-10-CM | POA: Diagnosis not present

## 2023-05-11 DIAGNOSIS — I472 Ventricular tachycardia, unspecified: Secondary | ICD-10-CM | POA: Diagnosis not present

## 2023-05-11 DIAGNOSIS — I10 Essential (primary) hypertension: Secondary | ICD-10-CM | POA: Diagnosis not present

## 2023-05-11 DIAGNOSIS — I25118 Atherosclerotic heart disease of native coronary artery with other forms of angina pectoris: Secondary | ICD-10-CM | POA: Diagnosis not present

## 2023-05-11 DIAGNOSIS — R42 Dizziness and giddiness: Secondary | ICD-10-CM | POA: Diagnosis not present

## 2023-05-11 DIAGNOSIS — E785 Hyperlipidemia, unspecified: Secondary | ICD-10-CM

## 2023-05-11 NOTE — Patient Instructions (Signed)
Medication Instructions:  The current medical regimen is effective;  continue present plan and medications as directed. Please refer to the Current Medication list given to you today.  *If you need a refill on your cardiac medications before your next appointment, please call your pharmacy*  Lab Work: NONE If you have labs (blood work) drawn today and your tests are completely normal, you will receive your results only by: MyChart Message (if you have MyChart) OR A paper copy in the mail If you have any lab test that is abnormal or we need to change your treatment, we will call you to review the results.  Testing/Procedures: Your physician has requested that you have a carotid duplex. This test is an ultrasound of the carotid arteries in your neck. It looks at blood flow through these arteries that supply the brain with blood. Allow one hour for this exam. There are no restrictions or special instructions.   Follow-Up: At [facility], you and your health needs are our priority.  As part of our continuing mission to provide you with exceptional heart care, we have created designated Provider Care Teams.  These Care Teams include your primary Cardiologist (physician) and Advanced Practice Providers (APPs -  Physician Assistants and Nurse Practitioners) who all work together to provide you with the care you need, when you need it.  We recommend signing up for the patient portal called "MyChart".  Sign up information is provided on this After Visit Summary.  MyChart is used to connect with patients for Virtual Visits (Telemedicine).  Patients are able to view lab/test results, encounter notes, upcoming appointments, etc.  Non-urgent messages can be sent to your provider as well.   To learn more about what you can do with MyChart, go to [email or web address].    Your next appointment:   4-6 month(s)  Provider:   Nanetta Batty, MD  or Edd Fabian, FNP        Other Instructions

## 2023-05-15 ENCOUNTER — Ambulatory Visit: Payer: Medicare PPO | Admitting: *Deleted

## 2023-05-15 DIAGNOSIS — R600 Localized edema: Secondary | ICD-10-CM | POA: Diagnosis not present

## 2023-05-15 DIAGNOSIS — I2510 Atherosclerotic heart disease of native coronary artery without angina pectoris: Secondary | ICD-10-CM | POA: Diagnosis not present

## 2023-05-15 DIAGNOSIS — I739 Peripheral vascular disease, unspecified: Secondary | ICD-10-CM | POA: Diagnosis not present

## 2023-05-15 DIAGNOSIS — J4550 Severe persistent asthma, uncomplicated: Secondary | ICD-10-CM

## 2023-05-15 DIAGNOSIS — R252 Cramp and spasm: Secondary | ICD-10-CM | POA: Diagnosis not present

## 2023-05-17 DIAGNOSIS — F3181 Bipolar II disorder: Secondary | ICD-10-CM | POA: Diagnosis not present

## 2023-05-17 DIAGNOSIS — F5105 Insomnia due to other mental disorder: Secondary | ICD-10-CM | POA: Diagnosis not present

## 2023-05-17 DIAGNOSIS — G4733 Obstructive sleep apnea (adult) (pediatric): Secondary | ICD-10-CM | POA: Diagnosis not present

## 2023-05-26 ENCOUNTER — Ambulatory Visit (HOSPITAL_COMMUNITY)
Admission: RE | Admit: 2023-05-26 | Discharge: 2023-05-26 | Disposition: A | Discharge status: Home / Self Care | Payer: Medicare PPO | Source: Ambulatory Visit | Attending: Cardiology | Admitting: Cardiology

## 2023-05-26 DIAGNOSIS — I6521 Occlusion and stenosis of right carotid artery: Secondary | ICD-10-CM

## 2023-05-26 DIAGNOSIS — I779 Disorder of arteries and arterioles, unspecified: Secondary | ICD-10-CM | POA: Diagnosis not present

## 2023-05-27 DIAGNOSIS — G4733 Obstructive sleep apnea (adult) (pediatric): Secondary | ICD-10-CM | POA: Diagnosis not present

## 2023-06-12 ENCOUNTER — Ambulatory Visit: Payer: Medicare PPO

## 2023-06-12 DIAGNOSIS — J4550 Severe persistent asthma, uncomplicated: Secondary | ICD-10-CM | POA: Diagnosis not present

## 2023-06-12 DIAGNOSIS — J45991 Cough variant asthma: Secondary | ICD-10-CM | POA: Diagnosis not present

## 2023-06-12 DIAGNOSIS — J309 Allergic rhinitis, unspecified: Secondary | ICD-10-CM | POA: Diagnosis not present

## 2023-06-16 DIAGNOSIS — R3912 Poor urinary stream: Secondary | ICD-10-CM | POA: Diagnosis not present

## 2023-06-16 DIAGNOSIS — N401 Enlarged prostate with lower urinary tract symptoms: Secondary | ICD-10-CM | POA: Diagnosis not present

## 2023-06-26 ENCOUNTER — Telehealth: Payer: Self-pay | Admitting: Cardiovascular Disease

## 2023-06-26 NOTE — Telephone Encounter (Signed)
Left message for patient to return the call.

## 2023-06-26 NOTE — Telephone Encounter (Signed)
Call goes straight voicemail.  LM to call office

## 2023-06-26 NOTE — Telephone Encounter (Signed)
  Pt is returning call. He said, try to call him at [phone]

## 2023-06-26 NOTE — Telephone Encounter (Signed)
Pt c/o swelling: STAT is pt has developed SOB within 24 hours  How much weight have you gained and in what time span? About 7 or 8 lbs over 3 weeks   If swelling, where is the swelling located? Legs, ankles and feet. Pt stated he's having some cramps in his calf muscle as well.   Are you currently taking a fluid pill? No   Are you currently SOB? No   Do you have a log of your daily weights (if so, list)? No  Have you gained 3 pounds in a day or 5 pounds in a week? yes  Have you traveled recently? Yes, to [place], [place]

## 2023-06-27 DIAGNOSIS — L988 Other specified disorders of the skin and subcutaneous tissue: Secondary | ICD-10-CM | POA: Diagnosis not present

## 2023-06-27 DIAGNOSIS — C44529 Squamous cell carcinoma of skin of other part of trunk: Secondary | ICD-10-CM | POA: Diagnosis not present

## 2023-06-27 NOTE — Telephone Encounter (Signed)
Pt returning nurses call. Pt would like a callback on mobile number. Please advise

## 2023-06-27 NOTE — Telephone Encounter (Signed)
Attempt to call patient for the fourth time.  Call to mobile and again straight to VM LM that he should schedule to see provider since unable to make contact

## 2023-06-27 NOTE — Telephone Encounter (Signed)
Follow Up:    Patient is calling back from yesterday. 

## 2023-06-27 NOTE — Telephone Encounter (Signed)
Call goes straight to VM.  LM to check and call office

## 2023-06-28 ENCOUNTER — Ambulatory Visit: Payer: Medicare PPO | Attending: Physician Assistant | Admitting: Physician Assistant

## 2023-06-28 ENCOUNTER — Encounter: Payer: Self-pay | Admitting: Physician Assistant

## 2023-06-28 VITALS — BP 112/64 | HR 69 | Ht 67.0 in | Wt 209.8 lb

## 2023-06-28 DIAGNOSIS — E785 Hyperlipidemia, unspecified: Secondary | ICD-10-CM | POA: Diagnosis not present

## 2023-06-28 DIAGNOSIS — I6523 Occlusion and stenosis of bilateral carotid arteries: Secondary | ICD-10-CM | POA: Diagnosis not present

## 2023-06-28 DIAGNOSIS — I739 Peripheral vascular disease, unspecified: Secondary | ICD-10-CM

## 2023-06-28 DIAGNOSIS — I872 Venous insufficiency (chronic) (peripheral): Secondary | ICD-10-CM

## 2023-06-28 NOTE — Patient Instructions (Signed)
Medication Instructions:  NO CHANGES *If you need a refill on your cardiac medications before your next appointment, please call your pharmacy*   Lab Work: NO LABS If you have labs (blood work) drawn today and your tests are completely normal, you will receive your results only by: MyChart Message (if you have MyChart) OR A paper copy in the mail If you have any lab test that is abnormal or we need to change your treatment, we will call you to review the results.   Testing/Procedures:[address] Your physician has requested that you have a lower extremity arterial duplex. This test is an ultrasound of the arteries in the legs or arms. It looks at arterial blood flow in the legs and arms. Allow one hour for Lower Arterial scans. There are no restrictions or special instructions .   Follow-Up: At [facility], you and your health needs are our priority.  As part of our continuing mission to provide you with exceptional heart care, we have created designated Provider Care Teams.  These Care Teams include your primary Cardiologist (physician) and Advanced Practice Providers (APPs -  Physician Assistants and Nurse Practitioners) who all work together to provide you with the care you need, when you need it.  We recommend signing up for the patient portal called "MyChart".  Sign up information is provided on this After Visit Summary.  MyChart is used to connect with patients for Virtual Visits (Telemedicine).  Patients are able to view lab/test results, encounter notes, upcoming appointments, etc.  Non-urgent messages can be sent to your provider as well.   To learn more about what you can do with MyChart, go to [email or web address].    Your next appointment:   KEEP FOLLOW UP IN DECEMBER 2024  Provider:   Edd Fabian, NP
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
## 2023-07-03 ENCOUNTER — Ambulatory Visit (HOSPITAL_COMMUNITY)
Admission: RE | Admit: 2023-07-03 | Discharge: 2023-07-03 | Disposition: A | Discharge status: Home / Self Care | Payer: Medicare PPO | Source: Ambulatory Visit | Attending: Cardiology | Admitting: Cardiology

## 2023-07-03 ENCOUNTER — Other Ambulatory Visit: Payer: Self-pay | Admitting: Physician Assistant

## 2023-07-03 DIAGNOSIS — I739 Peripheral vascular disease, unspecified: Secondary | ICD-10-CM | POA: Diagnosis not present

## 2023-07-04 ENCOUNTER — Telehealth: Payer: Self-pay | Admitting: *Deleted

## 2023-07-04 ENCOUNTER — Ambulatory Visit: Payer: Medicare PPO | Admitting: *Deleted

## 2023-07-04 DIAGNOSIS — J4550 Severe persistent asthma, uncomplicated: Secondary | ICD-10-CM

## 2023-07-04 NOTE — Telephone Encounter (Signed)
Noted will move to inactive

## 2023-07-04 NOTE — Telephone Encounter (Signed)
Patient came in to receive his Nucala injection. When I went to schedule his next appointment he stated that he will not be receiving them anymore from because he is going to start going to a different office.

## 2023-07-05 LAB — VAS US ABI WITH/WO TBI
Left ABI: 1.15
Right ABI: 0.87

## 2023-07-11 ENCOUNTER — Ambulatory Visit: Payer: Medicare PPO | Attending: Cardiovascular Disease | Admitting: Cardiovascular Disease

## 2023-07-11 ENCOUNTER — Encounter: Payer: Self-pay | Admitting: Cardiovascular Disease

## 2023-07-11 VITALS — BP 118/68 | HR 72 | Ht 67.5 in | Wt 212.2 lb

## 2023-07-11 DIAGNOSIS — I739 Peripheral vascular disease, unspecified: Secondary | ICD-10-CM | POA: Diagnosis not present

## 2023-07-11 DIAGNOSIS — I2510 Atherosclerotic heart disease of native coronary artery without angina pectoris: Secondary | ICD-10-CM | POA: Diagnosis not present

## 2023-07-11 DIAGNOSIS — I10 Essential (primary) hypertension: Secondary | ICD-10-CM

## 2023-07-11 DIAGNOSIS — E782 Mixed hyperlipidemia: Secondary | ICD-10-CM | POA: Diagnosis not present

## 2023-07-11 DIAGNOSIS — I6521 Occlusion and stenosis of right carotid artery: Secondary | ICD-10-CM | POA: Diagnosis not present

## 2023-07-11 DIAGNOSIS — E785 Hyperlipidemia, unspecified: Secondary | ICD-10-CM | POA: Diagnosis not present

## 2023-07-11 DIAGNOSIS — I779 Disorder of arteries and arterioles, unspecified: Secondary | ICD-10-CM | POA: Insufficient documentation

## 2023-07-11 MED ORDER — CILOSTAZOL 50 MG PO TABS: 50.0000 mg | ORAL_TABLET | Freq: Two times a day (BID) | ORAL | 3 refills | Status: DC

## 2023-07-11 NOTE — Patient Instructions (Signed)
Medication Instructions:  Your physician has recommended you make the following change in your medication:   -Start cilostazol (pletal) 50mg  twice daily.  *If you need a refill on your cardiac medications before your next appointment, please call your pharmacy*   Lab Work: Your physician recommends that you labs drawn today: Lipid/liver panel  If you have labs (blood work) drawn today and your tests are completely normal, you will receive your results only by: MyChart Message (if you have MyChart) OR A paper copy in the mail If you have any lab test that is abnormal or we need to change your treatment, we will call you to review the results.    Testing/Procedures: Your physician has requested that you have an Aorta/Iliac Duplex. This will be take place at [address] [facility], [address].  No food after 11PM the night before.  Water is OK. (Don't drink liquids if you have been instructed not to for ANOTHER test) Avoid foods that produce bowel gas, for 24 hours prior to exam (see below). No breakfast, no chewing gum, no smoking or carbonated beverages. Patient may take morning medications with water. Come in for test at least 15 minutes early to register.    Follow-Up: At [facility], you and your health needs are our priority.  As part of our continuing mission to provide you with exceptional heart care, we have created designated Provider Care Teams.  These Care Teams include your primary Cardiologist (physician) and Advanced Practice Providers (APPs -  Physician Assistants and Nurse Practitioners) who all work together to provide you with the care you need, when you need it.  We recommend signing up for the patient portal called "MyChart".  Sign up information is provided on this After Visit Summary.  MyChart is used to connect with patients for Virtual Visits (Telemedicine).  Patients are able to view lab/test results, encounter notes, upcoming appointments, etc.  Non-urgent  messages can be sent to your provider as well.   To learn more about what you can do with MyChart, go to [email or web address].    Your next appointment:   3 month(s)  Provider:   Nanetta Batty, MD

## 2023-07-11 NOTE — Assessment & Plan Note (Signed)
History of carotid artery disease with moderate right ICA stenosis by duplex ultrasound 05/26/2023.  This will be repeated on an annual basis.

## 2023-07-11 NOTE — Assessment & Plan Note (Signed)
History of hyperlipidemia on Repatha with lipid profile performed 04/18/2022 revealing total cholesterol 219, LDL 136 and HDL 40.  His lipid profile may have been gotten soon after starting Repatha.  I am going to repeat a fasting lipid liver profile today.  He is currently not at goal for secondary prevention.

## 2023-07-11 NOTE — Assessment & Plan Note (Signed)
Jared [name] is seen back for routine follow-up and also evaluation of PAD.  He does complain of right calf pain with ambulation, improves with rest.  He had Doppler study performed 07/03/2023 revealed a right ABI of 0.87 and a left of 1.15.  He did have a high-grade lesion in his proximal posterior tibial artery otherwise nonobstructive disease.  He had mild disease in his mid left SFA and proximal left anterior tibial artery.  I do not have Dopplers of his aortoiliac system which we will check.  It is hard to believe that he would have the symptoms from a posterior tibial artery stenosis.  I am going to start him on Pletal 50 mg p.o. twice daily and will see him back in 3 months for follow-up.

## 2023-07-11 NOTE — Assessment & Plan Note (Signed)
History of essential hypertension with blood pressure measured today at 118/68.  He is on amlodipine and metoprolol.

## 2023-07-11 NOTE — Assessment & Plan Note (Signed)
His ED status post RCA stenting by Dr. Clarene Duke in 2000.  He had repeat cath in 2011 revealing a widely patent RCA stent, 30% left main with otherwise no significant CAD and normal LV function.  He denies chest pain or shortness of breath.
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
## 2023-07-12 LAB — LIPID PANEL
Chol/HDL Ratio: 5.6 ratio — ABNORMAL HIGH (ref 0.0–5.0)
Cholesterol, Total: 185 mg/dL (ref 100–199)
HDL: 33 mg/dL — ABNORMAL LOW (ref >39)
LDL Chol Calc (NIH): 116 mg/dL — ABNORMAL HIGH (ref 0–99)
Triglycerides: 202 mg/dL — ABNORMAL HIGH (ref 0–149)
VLDL Cholesterol Cal: 36 mg/dL (ref 5–40)

## 2023-07-12 LAB — HEPATIC FUNCTION PANEL
ALT: 21 [IU]/L (ref 0–44)
AST: 20 [IU]/L (ref 0–40)
Albumin: 4.2 g/dL (ref 3.8–4.8)
Alkaline Phosphatase: 81 [IU]/L (ref 44–121)
Bilirubin Total: 0.3 mg/dL (ref 0.0–1.2)
Bilirubin, Direct: 0.12 mg/dL (ref 0.00–0.40)
Total Protein: 7.5 g/dL (ref 6.0–8.5)

## 2023-07-21 DIAGNOSIS — M25552 Pain in left hip: Secondary | ICD-10-CM | POA: Diagnosis not present

## 2023-07-21 DIAGNOSIS — M1612 Unilateral primary osteoarthritis, left hip: Secondary | ICD-10-CM | POA: Diagnosis not present

## 2023-07-24 ENCOUNTER — Ambulatory Visit (HOSPITAL_COMMUNITY)
Admission: RE | Admit: 2023-07-24 | Discharge: 2023-07-24 | Disposition: A | Discharge status: Home / Self Care | Payer: Medicare PPO | Source: Ambulatory Visit | Attending: Cardiology | Admitting: Cardiology

## 2023-07-24 DIAGNOSIS — I739 Peripheral vascular disease, unspecified: Secondary | ICD-10-CM | POA: Diagnosis not present

## 2023-07-24 DIAGNOSIS — E785 Hyperlipidemia, unspecified: Secondary | ICD-10-CM | POA: Diagnosis not present

## 2023-07-25 ENCOUNTER — Encounter: Payer: Self-pay | Admitting: *Deleted

## 2023-07-26 DIAGNOSIS — M25552 Pain in left hip: Secondary | ICD-10-CM | POA: Diagnosis not present

## 2023-08-08 DIAGNOSIS — R7302 Impaired glucose tolerance (oral): Secondary | ICD-10-CM | POA: Diagnosis not present

## 2023-08-08 DIAGNOSIS — I10 Essential (primary) hypertension: Secondary | ICD-10-CM | POA: Diagnosis not present

## 2023-08-08 DIAGNOSIS — M1652 Unilateral post-traumatic osteoarthritis, left hip: Secondary | ICD-10-CM | POA: Diagnosis not present

## 2023-08-08 DIAGNOSIS — F329 Major depressive disorder, single episode, unspecified: Secondary | ICD-10-CM | POA: Diagnosis not present

## 2023-08-08 DIAGNOSIS — G4730 Sleep apnea, unspecified: Secondary | ICD-10-CM | POA: Diagnosis not present

## 2023-08-08 DIAGNOSIS — I2510 Atherosclerotic heart disease of native coronary artery without angina pectoris: Secondary | ICD-10-CM | POA: Diagnosis not present

## 2023-08-08 DIAGNOSIS — I739 Peripheral vascular disease, unspecified: Secondary | ICD-10-CM | POA: Diagnosis not present

## 2023-08-08 DIAGNOSIS — E669 Obesity, unspecified: Secondary | ICD-10-CM | POA: Diagnosis not present

## 2023-08-08 DIAGNOSIS — E785 Hyperlipidemia, unspecified: Secondary | ICD-10-CM | POA: Diagnosis not present

## 2023-08-14 ENCOUNTER — Telehealth: Payer: Self-pay | Admitting: Cardiovascular Disease

## 2023-08-14 NOTE — Telephone Encounter (Signed)
Patient called and said that he has a 95% blockage in calf and said that the medication he is taking is not working to relieve any pain. Said that Dr. Allyson Sabal told him if it doesn't work, to call office to see if they can schedule stent to be put in if that is what Dr. Allyson Sabal wants to do. Please call back to confirm

## 2023-08-14 NOTE — Telephone Encounter (Signed)
Patient is returning call and is requesting call back.

## 2023-08-14 NOTE — Telephone Encounter (Signed)
Pt returning call to nurse

## 2023-08-14 NOTE — Telephone Encounter (Signed)
Spoke to patient he stated he is having severe pain and cramping in right calf.Stated he has been taking Pletal 50 mg twice a day with no relief.Advised Dr.Berry is out of office today.I will send message to him.

## 2023-08-14 NOTE — Telephone Encounter (Signed)
Returned call to patient no answer.Left message to call back.

## 2023-08-14 NOTE — Telephone Encounter (Signed)
Returned call to patient no answer.Left message on personal voice mail to call back.

## 2023-08-15 DIAGNOSIS — J4550 Severe persistent asthma, uncomplicated: Secondary | ICD-10-CM | POA: Diagnosis not present

## 2023-08-15 NOTE — Telephone Encounter (Signed)
Runell Gess, MD  Pugh, Juluis Pitch D, LPN16 hours ago (4:39 PM)    Needs ROV with me to discuss      Left message for pt regarding sooner appointment to see Dr. Allyson Sabal in clinic.

## 2023-08-15 NOTE — Telephone Encounter (Signed)
Spoke with pt regarding sooner appointment. Pt has been scheduled to see Dr. Allyson Sabal back on December 23rd at 8am. Pt verbalizes understanding.

## 2023-08-29 ENCOUNTER — Encounter (HOSPITAL_BASED_OUTPATIENT_CLINIC_OR_DEPARTMENT_OTHER): Payer: Self-pay | Admitting: Family

## 2023-08-29 ENCOUNTER — Ambulatory Visit (HOSPITAL_BASED_OUTPATIENT_CLINIC_OR_DEPARTMENT_OTHER): Payer: Medicare PPO | Admitting: Family

## 2023-08-29 VITALS — BP 124/82 | HR 78 | Resp 16 | Ht 67.0 in | Wt 214.2 lb

## 2023-08-29 DIAGNOSIS — I779 Disorder of arteries and arterioles, unspecified: Secondary | ICD-10-CM | POA: Diagnosis not present

## 2023-08-29 DIAGNOSIS — F3181 Bipolar II disorder: Secondary | ICD-10-CM | POA: Diagnosis not present

## 2023-08-29 DIAGNOSIS — I739 Peripheral vascular disease, unspecified: Secondary | ICD-10-CM | POA: Diagnosis not present

## 2023-08-29 DIAGNOSIS — I472 Ventricular tachycardia, unspecified: Secondary | ICD-10-CM | POA: Diagnosis not present

## 2023-08-29 DIAGNOSIS — E785 Hyperlipidemia, unspecified: Secondary | ICD-10-CM

## 2023-08-29 DIAGNOSIS — I471 Supraventricular tachycardia, unspecified: Secondary | ICD-10-CM

## 2023-08-29 DIAGNOSIS — F5105 Insomnia due to other mental disorder: Secondary | ICD-10-CM | POA: Diagnosis not present

## 2023-08-29 DIAGNOSIS — I25118 Atherosclerotic heart disease of native coronary artery with other forms of angina pectoris: Secondary | ICD-10-CM

## 2023-08-29 DIAGNOSIS — G4733 Obstructive sleep apnea (adult) (pediatric): Secondary | ICD-10-CM | POA: Diagnosis not present

## 2023-08-29 MED ORDER — CILOSTAZOL 50 MG PO TABS: 50.0000 mg | ORAL_TABLET | Freq: Two times a day (BID) | ORAL | 3 refills | Status: DC

## 2023-08-29 NOTE — Progress Notes (Signed)
Cardiology Office Note:  .   Date:  08/29/2023  ID:  Jared [name], DOB 1950/09/20, MRN [id number] PCP: Geoffry Paradise, MD  [facility] Providers Cardiologist:  Nanetta Batty, MD    History of Present Illness: .   Jared [name] is a 72 y.o. male  hx of CAD (2020 LHC with stent-LCA 2013 LHC patent stent and improved 30% LM stenosis), hypertension, hyperlipidemia with statin intolerance (Lipitor, Crestor), PAD  Seen 02/17/23 due to syncope. Monitor revealed predominantly NSR with 13 runs of SVT (fastest/longest 37.2 seconds max rate 190 bpm) 2 episodes VT (fastest 19 beats at 156 bpm), PVC burden 1.6%. Triggered episodes NSR or occasional PVC/PAC but not with SVT/VT. At follow up 03/2023 Toprol 25mg  daily initiated. Echo 04/13/23 normal LVEF 60-65%, mild AI.  Carotid duplex 05/2023 right ICA 40-59% stenosed, left ICA 1 to 39% stenosed.  Recommended for repeat duplex in 1 year.  Seen by Dr. Allyson Sabal 07/11/2023 with some right calf pain.  Prior Dopplers today 07/03/2023 right ABI 0.87 and left 1.15.  High-grade lesion of proximal posterior tibial artery otherwise nonobstructive disease.  He was started on Pletal 50 mg twice daily.  Dopplers of aortic iliac system unremarkable.  Presents today with his wife for leg pain. Reports the pain has gotten worse in his right calf. Very focused on "95% blockage in my calf". Interested in PV angiogram. He is no longer taking Pletal as "took for 6 weeks and it did not do anything". Reports after walking as little as 20 feet his calf feels it "is in a vice", resolves after 10 minutes of rest.   Reports no shortness of breath nor dyspnea on exertion. Reports no chest pain, pressure, or tightness. No edema, orthopnea, PND. Reports no palpitations.    ROS: Please see the history of present illness.    All other systems reviewed and are negative.   Studies Reviewed: Marland Kitchen        VAS US Aorta/IVC/Iliac Summary:  Abdominal Aorta: No evidence of an  abdominal aortic aneurysm was  visualized. The largest aortic measurement is 2.5 cm.  Stenosis:  No evidence of stenosis seen in the aorta and iliac arteries.   IVC/Iliac: There is no evidence of thrombus involving the IVC.  VAS [place] ABI 07/03/23  Bilateral ABIs and left TBIs appear essentially unchanged compared to  prior study on 12/29/2022. Right TBIs appear increased compared to prior  study on 12/29/2022.    Summary:  Right: Resting right ankle-brachial index indicates mild right lower  extremity arterial disease. The right toe-brachial index is normal.   Left: Resting left ankle-brachial index is within normal range. The left  toe-brachial index is normal.  Risk Assessment/Calculations:             Physical Exam:   VS:  BP 124/82 (BP Location: Right Arm, Patient Position: Sitting, Cuff Size: Normal)   Pulse 78   Resp 16   Ht 5\' 7"  (1.702 m)   Wt 214 lb 3.2 oz (97.2 kg)   SpO2 94%   BMI 33.55 kg/m    Wt Readings from Last 3 Encounters:  08/29/23 214 lb 3.2 oz (97.2 kg)  07/11/23 212 lb 3.2 oz (96.3 kg)  06/28/23 209 lb 12.8 oz (95.2 kg)    GEN: Well nourished, well developed in no acute distress NECK: No JVD; No carotid bruits CARDIAC: RRR, no murmurs, rubs, gallops RESPIRATORY:  Clear to auscultation without rales, wheezing or rhonchi  ABDOMEN: Soft, non-tender, non-distended EXTREMITIES:  No edema; No deformity. RLE DP 2+ and PT 1+. LLE 2+ and PT 2+  ASSESSMENT AND PLAN: .    PAD - RLE claudication symptoms to calf with walking 20 feet resolved by 10 minutes rest. RLE PT 1+ and DP 2+. Appropriate capillary refill. Discussed with Dr. Allyson Sabal prior to Jared [name] OV as would be atypical for posterior tibial artery disease to be symptomatic. Plan for CTA lower extremity to further assess anatomy, stenosis. He has single kidney but with good renal function agreeable to contrast. Update BMP, magnesium today. Increase Pletal to 100mg  AM and 50mg  PM.    Lightheadedness/syncope/VT/SVT- Quiescent on Toprol 25mg  every day. Echo with no structural abnormalities.   HLD, LDL goal send 70-intolerant to atorvastatin, rosuvastatin.  Continue Repatha. 06/2023 LDL 116, triglycerides 202.Not addressed at this clinic visit, address in future.  CAD-s/p remote dominant RCA stenting by Dr. Clarene Duke in 2000.  Cardiac catheterization 2011 revealing widely patent stent with 30% left main stenosis which was improved from prior. GDMT Aspirin, Repatha, Imdur. Stable with no anginal symptoms. No indication for ischemic evaluation.    Hypertension / Orthostatic hypotension - Telmisartan previously held for orthostatic hypotension, hyperkalemia. BP at goal.  Continue Imdur 30 mg daily, Amlodipine 5mg  daily.   Carotid stenosis - Carotid duplex 05/2023 right ICA 40-59% stenosed, left ICA 1 to 39% stenosed.  Recommended for repeat duplex in 1 year already ordered. Lipid management, as above.       Dispo: after CTA with Dr. Allyson Sabal  Signed, Alver Sorrow, NP

## 2023-08-29 NOTE — Patient Instructions (Addendum)
Medication Instructions:  Your physician has recommended you make the following change in your medication:   START Pletal 100 mg (two tablets) in the morning and 50 mg (one tablet) in the evening  *If you need a refill on your cardiac medications before your next appointment, please call your pharmacy*   Lab Work: BMP and Magnesium today   If you have labs (blood work) drawn today and your tests are completely normal, you will receive your results only by: MyChart Message (if you have MyChart) OR A paper copy in the mail If you have any lab test that is abnormal or we need to change your treatment, we will call you to review the results.   Testing/Procedures: Your provider has ordered an CT of your lower extremities (lower legs).   Follow-Up: At [facility], you and your health needs are our priority.  As part of our continuing mission to provide you with exceptional heart care, we have created designated Provider Care Teams.  These Care Teams include your primary Cardiologist (physician) and Advanced Practice Providers (APPs -  Physician Assistants and Nurse Practitioners) who all work together to provide you with the care you need, when you need it.  We recommend signing up for the patient portal called "MyChart".  Sign up information is provided on this After Visit Summary.  MyChart is used to connect with patients for Virtual Visits (Telemedicine).  Patients are able to view lab/test results, encounter notes, upcoming appointments, etc.  Non-urgent messages can be sent to your provider as well.   To learn more about what you can do with MyChart, go to [email or web address].    Your next appointment:   Follow up with Dr. Allyson Sabal Luther Parody will reach out to Dr. Hazle Coca nurse to schedule)

## 2023-08-30 ENCOUNTER — Ambulatory Visit (HOSPITAL_BASED_OUTPATIENT_CLINIC_OR_DEPARTMENT_OTHER)
Admission: RE | Admit: 2023-08-30 | Discharge: 2023-08-30 | Disposition: A | Discharge status: Home / Self Care | Payer: Medicare PPO | Source: Ambulatory Visit | Attending: Family | Admitting: Family

## 2023-08-30 ENCOUNTER — Ambulatory Visit: Payer: Medicare PPO | Admitting: General Practice

## 2023-08-30 DIAGNOSIS — I739 Peripheral vascular disease, unspecified: Secondary | ICD-10-CM | POA: Insufficient documentation

## 2023-08-30 LAB — BASIC METABOLIC PANEL
BUN/Creatinine Ratio: 18 (ref 10–24)
BUN: 24 mg/dL (ref 8–27)
CO2: 24 mmol/L (ref 20–29)
Calcium: 9.6 mg/dL (ref 8.6–10.2)
Chloride: 102 mmol/L (ref 96–106)
Creatinine, Ser: 1.33 mg/dL — ABNORMAL HIGH (ref 0.76–1.27)
Glucose: 91 mg/dL (ref 70–99)
Potassium: 5 mmol/L (ref 3.5–5.2)
Sodium: 140 mmol/L (ref 134–144)
eGFR: 56 mL/min/{1.73_m2} — ABNORMAL LOW (ref >59)

## 2023-08-30 LAB — MAGNESIUM: Magnesium: 2.1 mg/dL (ref 1.6–2.3)

## 2023-08-30 MED ORDER — IOHEXOL 350 MG/ML SOLN
100.0000 mL | Freq: Once | INTRAVENOUS | Status: AC | PRN
  Administered 2023-08-30: 100 mL via INTRAVENOUS

## 2023-08-31 ENCOUNTER — Telehealth: Payer: Self-pay | Admitting: Family

## 2023-08-31 ENCOUNTER — Other Ambulatory Visit (HOSPITAL_BASED_OUTPATIENT_CLINIC_OR_DEPARTMENT_OTHER): Payer: Self-pay | Admitting: Family

## 2023-08-31 ENCOUNTER — Telehealth (HOSPITAL_BASED_OUTPATIENT_CLINIC_OR_DEPARTMENT_OTHER): Payer: Self-pay

## 2023-08-31 DIAGNOSIS — I779 Disorder of arteries and arterioles, unspecified: Secondary | ICD-10-CM

## 2023-08-31 DIAGNOSIS — I472 Ventricular tachycardia, unspecified: Secondary | ICD-10-CM

## 2023-08-31 DIAGNOSIS — E785 Hyperlipidemia, unspecified: Secondary | ICD-10-CM

## 2023-08-31 DIAGNOSIS — G20C Parkinsonism, unspecified: Secondary | ICD-10-CM | POA: Diagnosis not present

## 2023-08-31 DIAGNOSIS — I25118 Atherosclerotic heart disease of native coronary artery with other forms of angina pectoris: Secondary | ICD-10-CM

## 2023-08-31 DIAGNOSIS — I739 Peripheral vascular disease, unspecified: Secondary | ICD-10-CM

## 2023-08-31 DIAGNOSIS — I471 Supraventricular tachycardia, unspecified: Secondary | ICD-10-CM

## 2023-08-31 NOTE — Telephone Encounter (Signed)
Pt returning call to nurse for results

## 2023-08-31 NOTE — Telephone Encounter (Signed)
Left message for patient to call back  

## 2023-08-31 NOTE — Telephone Encounter (Signed)
Returned call to patient and discussed lab results. Pt verbally understands and was thankful for call.

## 2023-08-31 NOTE — Telephone Encounter (Signed)
-----   Message from Alver Sorrow sent at 08/30/2023  9:00 PM EST ----- Stable kidney function. Normal electrolytes. Good result!

## 2023-09-01 ENCOUNTER — Telehealth: Payer: Self-pay | Admitting: Family

## 2023-09-01 ENCOUNTER — Telehealth: Payer: Self-pay

## 2023-09-01 NOTE — Telephone Encounter (Signed)
Patient is returning call to discuss CT results. 

## 2023-09-01 NOTE — Telephone Encounter (Signed)
Patient returned RN's call regarding results. 

## 2023-09-01 NOTE — Telephone Encounter (Signed)
Tried returning patients call about results, no answer left message

## 2023-09-01 NOTE — Telephone Encounter (Signed)
Patient identification verified by 2 forms. Jared Rail, RN    Called and spoke to patient  Relayed provider message below  Patient aware of 10/03/23 appointment at 3pm with Dr. Allyson Sabal  Patient states:   -Saw Neurologist Dr. Arlana Pouch yesterday   -Dr. Arlana Pouch believes he could have early parkinson's   -Dr. Arlana Pouch started him on 1/2 Sinemet TID for 1 week and increase to 1 Sinemet TID   -Dr. Arlana Pouch would like Dr. Allyson Sabal to review note  Informed patient message sent to Dr. Allyson Sabal to review  Patient has no further questions at this time

## 2023-09-01 NOTE — Telephone Encounter (Signed)
  Alver Sorrow, NP 09/01/2023 10:42 AM EST     No flow limiting stenosis in bilateral lower extremity which is reassuring. Recommend follow up as scheduled with Dr. Allyson Sabal. Continue Pletal as prescribed 100mg  AM and 50mg  PM.   Left message for patient to call back.

## 2023-09-09 ENCOUNTER — Other Ambulatory Visit: Payer: Self-pay

## 2023-09-09 ENCOUNTER — Encounter (HOSPITAL_BASED_OUTPATIENT_CLINIC_OR_DEPARTMENT_OTHER): Payer: Self-pay

## 2023-09-09 ENCOUNTER — Emergency Department (HOSPITAL_BASED_OUTPATIENT_CLINIC_OR_DEPARTMENT_OTHER)
Admission: EM | Admit: 2023-09-09 | Discharge: 2023-09-09 | Disposition: A | Discharge status: Home / Self Care | Payer: Medicare PPO | Attending: Emergency Medicine | Admitting: Emergency Medicine

## 2023-09-09 ENCOUNTER — Emergency Department (HOSPITAL_BASED_OUTPATIENT_CLINIC_OR_DEPARTMENT_OTHER): Payer: Medicare PPO | Admitting: Radiology

## 2023-09-09 DIAGNOSIS — R0600 Dyspnea, unspecified: Secondary | ICD-10-CM | POA: Diagnosis not present

## 2023-09-09 DIAGNOSIS — J069 Acute upper respiratory infection, unspecified: Secondary | ICD-10-CM | POA: Insufficient documentation

## 2023-09-09 DIAGNOSIS — K219 Gastro-esophageal reflux disease without esophagitis: Secondary | ICD-10-CM | POA: Diagnosis not present

## 2023-09-09 DIAGNOSIS — Z1152 Encounter for screening for COVID-19: Secondary | ICD-10-CM | POA: Insufficient documentation

## 2023-09-09 DIAGNOSIS — Z20822 Contact with and (suspected) exposure to covid-19: Secondary | ICD-10-CM | POA: Diagnosis not present

## 2023-09-09 DIAGNOSIS — I2510 Atherosclerotic heart disease of native coronary artery without angina pectoris: Secondary | ICD-10-CM | POA: Diagnosis not present

## 2023-09-09 DIAGNOSIS — E039 Hypothyroidism, unspecified: Secondary | ICD-10-CM | POA: Diagnosis not present

## 2023-09-09 DIAGNOSIS — B9789 Other viral agents as the cause of diseases classified elsewhere: Secondary | ICD-10-CM | POA: Insufficient documentation

## 2023-09-09 DIAGNOSIS — I10 Essential (primary) hypertension: Secondary | ICD-10-CM | POA: Diagnosis not present

## 2023-09-09 DIAGNOSIS — Z7982 Long term (current) use of aspirin: Secondary | ICD-10-CM | POA: Diagnosis not present

## 2023-09-09 DIAGNOSIS — J45909 Unspecified asthma, uncomplicated: Secondary | ICD-10-CM | POA: Insufficient documentation

## 2023-09-09 DIAGNOSIS — R918 Other nonspecific abnormal finding of lung field: Secondary | ICD-10-CM | POA: Diagnosis not present

## 2023-09-09 DIAGNOSIS — E785 Hyperlipidemia, unspecified: Secondary | ICD-10-CM | POA: Insufficient documentation

## 2023-09-09 DIAGNOSIS — R062 Wheezing: Secondary | ICD-10-CM | POA: Diagnosis not present

## 2023-09-09 DIAGNOSIS — R059 Cough, unspecified: Secondary | ICD-10-CM | POA: Diagnosis not present

## 2023-09-09 LAB — RESP PANEL BY RT-PCR (RSV, FLU A&B, COVID)  RVPGX2
Influenza A by PCR: NEGATIVE
Influenza B by PCR: NEGATIVE
Resp Syncytial Virus by PCR: NEGATIVE
SARS Coronavirus 2 by RT PCR: NEGATIVE

## 2023-09-09 MED ORDER — PREDNISONE 20 MG PO TABS: 40.0000 mg | ORAL_TABLET | Freq: Every day | ORAL | 0 refills | Status: DC

## 2023-09-09 NOTE — Discharge Instructions (Signed)
As discussed with Dr. Anitra Lauth, fill the prescription provided if you feel your symptoms of cough and wheezing are not improving.   Return to the ED as needed for any concerning symptoms, otherwise, follow up with your doctor for recheck next week.

## 2023-09-09 NOTE — ED Provider Notes (Signed)
[place] [facility]ERGENC[facility] DEPARTMENT AT [facility] Provider Note   CSN: [id number] Arrival date & time: 09/09/23  1335     History  No chief complaint on file.   Jared [name] is a 72 y.o. male.  Patient woke Tuesday during the night with acid reflux, after which he has had progressively worsening coughing and wheezing through the week. No chest pain, fever. He has an inhaler that offers little relief. No sick contacts. Patient is a nonsmoker.   The history is provided by the patient. No language interpreter was used.       Home Medications Prior to Admission medications   Medication Sig Start Date End Date Taking? Authorizing Provider  predniSONE (DELTASONE) 20 MG tablet Take 2 tablets (40 mg total) by mouth daily. 09/09/23  Yes Jane Birkel, Melvenia Beam, PA-C  albuterol (PROVENTIL) (2.5 MG/3ML) 0.083% nebulizer solution Take 3 mLs (2.5 mg total) by nebulization every 6 (six) hours as needed for wheezing or shortness of breath. 12/20/22   Kozlow, Alvira Philips, MD  albuterol (VENTOLIN HFA) 108 (90 Base) MCG/ACT inhaler Inhale 2 puffs into the lungs every 4 (four) hours as needed for wheezing or shortness of breath. Can inhale two puffs every four to six hours as needed for cough, wheeze, shortness of breath, or chest tightness. 02/14/23   Marcelyn Bruins, MD  amLODipine (NORVASC) 5 MG tablet Take 1 tablet (5 mg total) by mouth daily. 03/20/23 08/29/23  Alver Sorrow, NP  aspirin 325 MG EC tablet Take 325 mg by mouth daily.    [provider]  cilostazol (PLETAL) 50 MG tablet Take 1 tablet (50 mg total) by mouth 2 (two) times daily. 08/29/23   Alver Sorrow, NP  DULoxetine (CYMBALTA) 60 MG capsule Take 60 mg by mouth every morning. 04/22/21   [provider]  EPINEPHrine 0.3 mg/0.3 mL IJ SOAJ injection Inject 0.3 mg into the muscle as needed. 06/12/23   [provider]  ergocalciferol (VITAMIN D2) 1.25 MG (50000 UT) capsule Take 50,000 Units by mouth once  a week. Saturday    [provider]  esomeprazole (NEXIUM) 20 MG capsule Take 1 capsule (20 mg total) by mouth daily. 03/14/23   Armbruster, Willaim Rayas, MD  Evolocumab (REPATHA SURECLICK) 140 MG/ML SOAJ Inject 1 Pen into the skin every 14 (fourteen) days. 03/23/22   Runell Gess, MD  isosorbide mononitrate (IMDUR) 30 MG 24 hr tablet take 1 tablet by mouth once daily Patient taking differently: at bedtime. 06/21/17   Runell Gess, MD  levothyroxine (SYNTHROID) 50 MCG tablet Take 50 mcg by mouth daily before breakfast.    [provider]  metoprolol succinate (TOPROL-XL) 25 MG 24 hr tablet Take 25 mg by mouth daily. 06/15/23   [provider]  Multiple Vitamin (MULTIVITAMIN) tablet Take 1 tablet by mouth at bedtime.     [provider]  NUCALA 100 MG/ML SOSY Inject 1 mL into the skin every 30 (thirty) days. 02/14/23   [provider]  Spacer/Aero-Holding Deretha [facility] DIAMOND) MISC 1 each by Other route daily. Use as directed with inhaler. 12/20/22   Kozlow, Alvira Philips, MD  vitamin C (ASCORBIC ACID) 500 MG tablet Take 500 mg by mouth 2 (two) times daily.     [provider]      Allergies    Azithromycin, Contrast media [iodinated contrast media], Nsaids, Penicillins, Misc. sulfonamide containing compounds, Codeine, Penicillamine, Pholcodine, Statins, and Sulfonamide derivatives    Review of Systems  Review of Systems  Physical Exam Updated Vital Signs BP (!) 146/75 (BP Location: Right Arm)   Pulse 84   Temp 98.3 F (36.8 C)   Resp 20   SpO2 96%  Physical Exam Vitals and nursing note reviewed.  Constitutional:      Appearance: Normal appearance. He is well-developed.  HENT:     Head: Normocephalic.  Cardiovascular:     Rate and Rhythm: Normal rate and regular rhythm.     Heart sounds: No murmur heard. Pulmonary:     Effort: Pulmonary effort is normal.     Breath sounds: Normal breath sounds. No rhonchi or rales.      Comments: Good air movement bilaterally with mild end expiratory wheezing.  Abdominal:     General: Bowel sounds are normal.     Palpations: Abdomen is soft.     Tenderness: There is no abdominal tenderness. There is no guarding or rebound.  Musculoskeletal:        General: Normal range of motion.     Cervical back: Normal range of motion and neck supple.  Skin:    General: Skin is warm and dry.  Neurological:     Mental Status: He is alert and oriented to person, place, and time.     ED Results / Procedures / Treatments   Labs (all labs ordered are listed, but only abnormal results are displayed) Labs Reviewed  RESP PANEL BY RT-PCR (RSV, FLU A&B, COVID)  [id number]    EKG None  Radiology DG Chest 2 View Result Date: 09/09/2023 CLINICAL DATA:  Dyspnea, wheezing, cough EXAM: CHEST - 2 VIEW COMPARISON:  03/01/2023 chest radiograph. FINDINGS: Surgical hardware from ACDF overlies the lower cervical spine. Stable cardiomediastinal silhouette with normal heart size. No pneumothorax. No pleural effusion. No pulmonary edema. Mild platelike scarring versus atelectasis at the right lung base. No acute consolidative airspace disease. IMPRESSION: Mild platelike scarring versus atelectasis at the right lung base. No acute cardiopulmonary disease. Electronically Signed   By: Delbert Phenix M.D.   On: 09/09/2023 14:15    Procedures Procedures    Medications Ordered in ED Medications - No data to display  ED Course/ Medical Decision Making/ A&P                                 Medical Decision Making This patient presents to the ED for concern of cough, this involves an extensive number of treatment options, and is a complaint that carries with it a high risk of complications and morbidity.  The differential diagnosis includes PNA including aspiration PNA, viral URI, COVID, acid reflux.    Co morbidities that complicate the patient evaluation  CAD, HTN, HLD, GERD, renal cell CA, asthma,  hypothyroid   Additional history obtained:  Additional history and/or information obtained from chart review, notable for N/A   Lab Tests:  I Ordered, and personally interpreted labs.  The pertinent results include:  Viral respiratory panel negative    Imaging Studies ordered:  I ordered imaging studies including CXR I independently visualized and interpreted imaging which showed No infiltrates or consolidations I agree with the radiologist interpretation     Consultations Obtained:  I requested consultation with the ED attending, Dr. Anitra Lauth, who has seen the patient,  and discussed lab and imaging findings as well as pertinent plan - they recommend: Provide written Rx for prednisone (40 mg x 5 days) that patient will fill if  he feels he is worsening. Ok to discharge home.    Problem List / ED Course:  Cough with wheezing x 4 days, onset after waking with acid reflux during the night No fever, no chest pain H/O asthma and has been using his inhaler this week, nebulizer has not been needed.  CXR clear - doubt PNA or aspiration Viral panel negative.    Social Determinants of Health:  Good family support Well established health care   Disposition:  After consideration of the diagnostic results and the patients response to treatment, I feel that the patient would benefit from discharge home.   Amount and/or Complexity of Data Reviewed Radiology: ordered.           Final Clinical Impression(s) / ED Diagnoses Final diagnoses:  Viral upper respiratory tract infection    Rx / DC Orders ED Discharge Orders          Ordered    predniSONE (DELTASONE) 20 MG tablet  Daily        09/09/23 1528              Elpidio Anis, PA-C 09/09/23 1529    Gwyneth Sprout, MD 09/10/23 [phone]

## 2023-09-09 NOTE — ED Triage Notes (Signed)
Pt c/o wheezing, nonproductive cough, URI symptoms "x a couple days now." Rescue inhaler w minimal help. Hx asthma  States he talked to PCP, advised to come for eval

## 2023-09-11 ENCOUNTER — Ambulatory Visit: Payer: Medicare PPO | Admitting: Cardiovascular Disease

## 2023-09-11 DIAGNOSIS — M25552 Pain in left hip: Secondary | ICD-10-CM | POA: Diagnosis not present

## 2023-09-11 DIAGNOSIS — Z96652 Presence of left artificial knee joint: Secondary | ICD-10-CM | POA: Diagnosis not present

## 2023-10-03 ENCOUNTER — Encounter: Payer: Self-pay | Admitting: Cardiovascular Disease

## 2023-10-03 ENCOUNTER — Ambulatory Visit: Payer: Medicare PPO | Attending: Cardiovascular Disease | Admitting: Cardiovascular Disease

## 2023-10-03 VITALS — BP 120/64 | HR 80 | Ht 67.0 in | Wt 211.0 lb

## 2023-10-03 DIAGNOSIS — I739 Peripheral vascular disease, unspecified: Secondary | ICD-10-CM

## 2023-10-03 NOTE — Patient Instructions (Addendum)
 Medication Instructions:  Your physician recommends that you continue on your current medications as directed. Please refer to the Current Medication list given to you today.  *If you need a refill on your cardiac medications before your next appointment, please call your pharmacy*   Lab Work: Your physician recommends that you have labs drawn today: BMET & CBC  If you have labs (blood work) drawn today and your tests are completely normal, you will receive your results only by: MyChart Message (if you have MyChart) OR A paper copy in the mail If you have any lab test that is abnormal or we need to change your treatment, we will call you to review the results.   Testing/Procedures: Your physician has requested that you have an Aorta/Iliac Duplex. This will be take place at [address], [address].  No food after 11PM the night before.  Water  is OK. (Don't drink liquids if you have been instructed not to for ANOTHER test) Avoid foods that produce bowel gas, for 24 hours prior to exam (see below). No breakfast, no chewing gum, no smoking or carbonated beverages. Patient may take morning medications with water . Come in for test at least 15 minutes early to register. To be done 1-2 weeks after procedure (1/30).  Please note: We ask at that you not bring children with you during ultrasound (echo/ vascular) testing. Due to room size and safety concerns, children are not allowed in the ultrasound rooms during exams. Our front office staff cannot provide observation of children in our lobby area while testing is being conducted. An adult accompanying a patient to their appointment will only be allowed in the ultrasound room at the discretion of the ultrasound technician under special circumstances. We apologize for any inconvenience.  Your physician has requested that you have a lower extremity arterial duplex. During this test, ultrasound is used to evaluate arterial blood flow in the legs.  Allow one hour for this exam. There are no restrictions or special instructions. This will take place at [address], [address]. To be done 1-2 weeks after procedure (1/30).  Please note: We ask at that you not bring children with you during ultrasound (echo/ vascular) testing. Due to room size and safety concerns, children are not allowed in the ultrasound rooms during exams. Our front office staff cannot provide observation of children in our lobby area while testing is being conducted. An adult accompanying a patient to their appointment will only be allowed in the ultrasound room at the discretion of the ultrasound technician under special circumstances. We apologize for any inconvenience.  Your physician has requested that you have an ankle brachial index (ABI). During this test an ultrasound and blood pressure cuff are used to evaluate the arteries that supply the arms and legs with blood. Allow thirty minutes for this exam. There are no restrictions or special instructions. This will take place at [address], [address]. To be done 1-2 weeks after procedure (1/30).   Please note: We ask at that you not bring children with you during ultrasound (echo/ vascular) testing. Due to room size and safety concerns, children are not allowed in the ultrasound rooms during exams. Our front office staff cannot provide observation of children in our lobby area while testing is being conducted. An adult accompanying a patient to their appointment will only be allowed in the ultrasound room at the discretion of the ultrasound technician under special circumstances. We apologize for any inconvenience.  Follow-Up: At [facility], you and your health needs are our priority.  As part of our continuing mission to provide you with exceptional heart care, we have created designated Provider Care Teams.  These Care Teams include your primary Cardiologist (physician) and Advanced Practice  Providers (APPs -  Physician Assistants and Nurse Practitioners) who all work together to provide you with the care you need, when you need it.  We recommend signing up for the patient portal called MyChart.  Sign up information is provided on this After Visit Summary.  MyChart is used to connect with patients for Virtual Visits (Telemedicine).  Patients are able to view lab/test results, encounter notes, upcoming appointments, etc.  Non-urgent messages can be sent to your provider as well.   To learn more about what you can do with MyChart, go to [email or web address].    Your next appointment:   2-3 week(s) after your procedure (1/30)  Provider:   Dorn Lesches, MD     Other Instructions       Cardiac/Peripheral Catheterization   You are scheduled for a Peripheral Angiogram on Thursday, January 30 with Dr. Dorn Lesches.  1. Please arrive at the [facility] (Main Entrance A) at [facility]: [address] [place], [place] [address] at 5:30 AM (This time is 4 hour(s) before your procedure to ensure your preparation).   Free valet parking service is available. You will check in at ADMITTING. The support person will be asked to wait in the waiting room.  It is OK to have someone drop you off and come back when you are ready to be discharged.        Special note: Every effort is made to have your procedure done on time. Please understand that emergencies sometimes delay scheduled procedures.  2. Diet: Do not eat solid foods after midnight.  You may have clear liquids until 5 AM the day of the procedure.  3. Labs: You will need to have blood drawn today (1/14)  4. Medication instructions in preparation for your procedure:  Hold isosorbide  mononitrate (Imdur ) for 3 days prior to procedure. Last dose on 10/08/23.  On the morning of your procedure, take Aspirin  81 mg and any morning medicines NOT listed above.  You may use sips of water .  5. Plan to go home the same day,  you will only stay overnight if medically necessary. 6. You MUST have a responsible adult to drive you home. 7. An adult MUST be with you the first 24 hours after you arrive home. 8. Bring a current list of your medications, and the last time and date medication taken. 9. Bring ID and current insurance cards. 10.Please wear clothes that are easy to get on and off and wear slip-on shoes.  Thank you for allowing us  to care for you!   -- [facility]

## 2023-10-03 NOTE — Assessment & Plan Note (Signed)
 Jared [name]  returns today for follow-up of PAD.  He continues to complain of right calf claudication with vomiting.  He felt cilostazol .  His Doppler studies performed 08/03/2023 revealed a right ABI of 0.87 and a left of 1.15.  He only appeared to have a high-grade right posterior tibial stenosis which typically does not explain his symptomatology.  He is fairly insistent on pursuing PV angiography and endovascular therapy.
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
## 2023-10-04 LAB — BASIC METABOLIC PANEL
BUN/Creatinine Ratio: 17 (ref 10–24)
BUN: 23 mg/dL (ref 8–27)
CO2: 17 mmol/L — ABNORMAL LOW (ref 20–29)
Calcium: 10 mg/dL (ref 8.6–10.2)
Chloride: 105 mmol/L (ref 96–106)
Creatinine, Ser: 1.37 mg/dL — ABNORMAL HIGH (ref 0.76–1.27)
Glucose: 104 mg/dL — ABNORMAL HIGH (ref 70–99)
Potassium: 4.7 mmol/L (ref 3.5–5.2)
Sodium: 140 mmol/L (ref 134–144)
eGFR: 54 mL/min/{1.73_m2} — ABNORMAL LOW (ref >59)

## 2023-10-04 LAB — CBC WITH DIFFERENTIAL/PLATELET
Basophils Absolute: 0.1 10*3/uL (ref 0.0–0.2)
Basos: 1 %
EOS (ABSOLUTE): 0.3 10*3/uL (ref 0.0–0.4)
Eos: 2 %
Hematocrit: 44.7 % (ref 37.5–51.0)
Hemoglobin: 13.8 g/dL (ref 13.0–17.7)
Immature Grans (Abs): 0.1 10*3/uL (ref 0.0–0.1)
Immature Granulocytes: 1 %
Lymphocytes Absolute: 2.3 10*3/uL (ref 0.7–3.1)
Lymphs: 16 %
MCH: 25.7 pg — ABNORMAL LOW (ref 26.6–33.0)
MCHC: 30.9 g/dL — ABNORMAL LOW (ref 31.5–35.7)
MCV: 83 fL (ref 79–97)
Monocytes Absolute: 1.2 10*3/uL — ABNORMAL HIGH (ref 0.1–0.9)
Monocytes: 9 %
Neutrophils Absolute: 10.1 10*3/uL — ABNORMAL HIGH (ref 1.4–7.0)
Neutrophils: 71 %
Platelets: 393 10*3/uL (ref 150–450)
RBC: 5.38 x10E6/uL (ref 4.14–5.80)
RDW: 14.6 % (ref 11.6–15.4)
WBC: 14 10*3/uL — ABNORMAL HIGH (ref 3.4–10.8)

## 2023-10-05 DIAGNOSIS — L570 Actinic keratosis: Secondary | ICD-10-CM | POA: Diagnosis not present

## 2023-10-12 DIAGNOSIS — G20A1 Parkinson's disease without dyskinesia, without mention of fluctuations: Secondary | ICD-10-CM | POA: Diagnosis not present

## 2023-10-17 DIAGNOSIS — I739 Peripheral vascular disease, unspecified: Secondary | ICD-10-CM | POA: Diagnosis not present

## 2023-10-17 DIAGNOSIS — G4730 Sleep apnea, unspecified: Secondary | ICD-10-CM | POA: Diagnosis not present

## 2023-10-17 DIAGNOSIS — E669 Obesity, unspecified: Secondary | ICD-10-CM | POA: Diagnosis not present

## 2023-10-17 DIAGNOSIS — I10 Essential (primary) hypertension: Secondary | ICD-10-CM | POA: Diagnosis not present

## 2023-10-17 DIAGNOSIS — E785 Hyperlipidemia, unspecified: Secondary | ICD-10-CM | POA: Diagnosis not present

## 2023-10-17 DIAGNOSIS — R7302 Impaired glucose tolerance (oral): Secondary | ICD-10-CM | POA: Diagnosis not present

## 2023-10-17 DIAGNOSIS — E039 Hypothyroidism, unspecified: Secondary | ICD-10-CM | POA: Diagnosis not present

## 2023-10-18 ENCOUNTER — Telehealth: Payer: Self-pay | Admitting: *Deleted

## 2023-10-18 NOTE — Telephone Encounter (Signed)
Abdominal aortogram scheduled at [facility] for: Thursday October 19, 2023 9 AM Arrival time [facility] Main Entrance A at: 5:30 AM-pre-procedure hydration  Nothing to eat after midnight prior to procedure, clear liquids until 5 AM day of procedure.  Medication instructions: -Hold:  Moujaro-should not take AM of procedure if weekly dose on Thursdays -Other usual morning medications can be taken with sips of water including aspirin 81 mg.  Plan to go home the same day, you will only stay overnight if medically necessary.  You must have responsible adult to drive you home.  Someone must be with you the first 24 hours after you arrive home.  Left message for patient to call back to review procedure instructions

## 2023-10-19 ENCOUNTER — Ambulatory Visit (HOSPITAL_COMMUNITY)
Admission: RE | Admit: 2023-10-19 | Discharge: 2023-10-20 | Disposition: A | Discharge status: Home / Self Care | Payer: Medicare PPO | Source: Ambulatory Visit | Attending: Cardiovascular Disease | Admitting: Cardiovascular Disease

## 2023-10-19 ENCOUNTER — Encounter (HOSPITAL_COMMUNITY)
Admission: RE | Disposition: A | Discharge status: Home / Self Care | Payer: Self-pay | Source: Ambulatory Visit | Attending: Cardiovascular Disease

## 2023-10-19 ENCOUNTER — Other Ambulatory Visit: Payer: Self-pay

## 2023-10-19 ENCOUNTER — Ambulatory Visit (HOSPITAL_COMMUNITY): Payer: Medicare PPO

## 2023-10-19 DIAGNOSIS — I70211 Atherosclerosis of native arteries of extremities with intermittent claudication, right leg: Secondary | ICD-10-CM | POA: Diagnosis not present

## 2023-10-19 DIAGNOSIS — R918 Other nonspecific abnormal finding of lung field: Secondary | ICD-10-CM | POA: Insufficient documentation

## 2023-10-19 DIAGNOSIS — S301XXA Contusion of abdominal wall, initial encounter: Secondary | ICD-10-CM | POA: Diagnosis not present

## 2023-10-19 DIAGNOSIS — K829 Disease of gallbladder, unspecified: Secondary | ICD-10-CM | POA: Insufficient documentation

## 2023-10-19 DIAGNOSIS — I25119 Atherosclerotic heart disease of native coronary artery with unspecified angina pectoris: Secondary | ICD-10-CM | POA: Diagnosis not present

## 2023-10-19 DIAGNOSIS — K5732 Diverticulitis of large intestine without perforation or abscess without bleeding: Secondary | ICD-10-CM | POA: Insufficient documentation

## 2023-10-19 DIAGNOSIS — Z79899 Other long term (current) drug therapy: Secondary | ICD-10-CM | POA: Diagnosis not present

## 2023-10-19 DIAGNOSIS — Z7902 Long term (current) use of antithrombotics/antiplatelets: Secondary | ICD-10-CM | POA: Diagnosis not present

## 2023-10-19 DIAGNOSIS — Z955 Presence of coronary angioplasty implant and graft: Secondary | ICD-10-CM | POA: Insufficient documentation

## 2023-10-19 DIAGNOSIS — N281 Cyst of kidney, acquired: Secondary | ICD-10-CM | POA: Diagnosis not present

## 2023-10-19 DIAGNOSIS — Z905 Acquired absence of kidney: Secondary | ICD-10-CM | POA: Diagnosis not present

## 2023-10-19 DIAGNOSIS — I739 Peripheral vascular disease, unspecified: Secondary | ICD-10-CM | POA: Diagnosis present

## 2023-10-19 HISTORY — PX: PERIPHERAL VASCULAR BALLOON ANGIOPLASTY: CATH118281

## 2023-10-19 HISTORY — PX: ABDOMINAL AORTOGRAM W/LOWER EXTREMITY: CATH118223

## 2023-10-19 LAB — POCT ACTIVATED CLOTTING TIME
Activated Clotting Time: 256 s
Activated Clotting Time: 302 s
Activated Clotting Time: 245 s
Activated Clotting Time: 193 s
Activated Clotting Time: 158 s

## 2023-10-19 LAB — C-REACTIVE PROTEIN: CRP: 2.8 mg/dL — ABNORMAL HIGH (ref <1.0)

## 2023-10-19 SURGERY — ABDOMINAL AORTOGRAM W/LOWER EXTREMITY
Anesthesia: LOCAL

## 2023-10-19 MED ORDER — ALUM & MAG HYDROXIDE-SIMETH 200-200-20 MG/5ML PO SUSP
30.0000 mL | ORAL | Status: DC | PRN
  Administered 2023-10-19: 30 mL via ORAL

## 2023-10-19 MED ORDER — HYDROCODONE-ACETAMINOPHEN 5-325 MG PO TABS
1.0000 | ORAL_TABLET | Freq: Four times a day (QID) | ORAL | Status: DC | PRN
  Administered 2023-10-19 (×2): 1 via ORAL
  Filled 2023-10-19 (×2): qty 1

## 2023-10-19 MED ORDER — ONDANSETRON HCL 4 MG/2ML IJ SOLN: 4.0000 mg | Freq: Four times a day (QID) | INTRAMUSCULAR | Status: DC | PRN

## 2023-10-19 MED ORDER — CLOPIDOGREL BISULFATE 300 MG PO TABS
ORAL_TABLET | ORAL | Status: DC | PRN
  Administered 2023-10-19: 300 mg via ORAL

## 2023-10-19 MED ORDER — HYDRALAZINE HCL 20 MG/ML IJ SOLN: 5.0000 mg | INTRAMUSCULAR | Status: DC | PRN

## 2023-10-19 MED ORDER — FENTANYL CITRATE (PF) 100 MCG/2ML IJ SOLN
INTRAMUSCULAR | Status: DC | PRN
  Administered 2023-10-19: 25 ug via INTRAVENOUS

## 2023-10-19 MED ORDER — LABETALOL HCL 5 MG/ML IV SOLN: 10.0000 mg | INTRAVENOUS | Status: DC | PRN

## 2023-10-19 MED ORDER — CLOPIDOGREL BISULFATE 300 MG PO TABS
ORAL_TABLET | ORAL | Status: AC
  Filled 2023-10-19: qty 1

## 2023-10-19 MED ORDER — ASPIRIN 81 MG PO TBEC
81.0000 mg | DELAYED_RELEASE_TABLET | Freq: Every day | ORAL | Status: DC
  Administered 2023-10-20: 81 mg via ORAL
  Filled 2023-10-19: qty 1

## 2023-10-19 MED ORDER — ACETAMINOPHEN 325 MG PO TABS
650.0000 mg | ORAL_TABLET | ORAL | Status: DC | PRN
Start: 2023-10-19 — End: 2023-10-20

## 2023-10-19 MED ORDER — HEPARIN SODIUM (PORCINE) 1000 UNIT/ML IJ SOLN
INTRAMUSCULAR | Status: DC | PRN
  Administered 2023-10-19: 10000 [IU] via INTRAVENOUS
  Administered 2023-10-19: 3000 [IU] via INTRAVENOUS

## 2023-10-19 MED ORDER — METOPROLOL SUCCINATE ER 25 MG PO TB24
25.0000 mg | ORAL_TABLET | Freq: Every day | ORAL | Status: DC
  Administered 2023-10-19: 25 mg via ORAL
  Filled 2023-10-19: qty 1

## 2023-10-19 MED ORDER — IOHEXOL 350 MG/ML SOLN
75.0000 mL | Freq: Once | INTRAVENOUS | Status: AC | PRN
  Administered 2023-10-19: 75 mL via INTRAVENOUS

## 2023-10-19 MED ORDER — MORPHINE SULFATE (PF) 2 MG/ML IV SOLN
2.0000 mg | INTRAVENOUS | Status: DC | PRN
  Administered 2023-10-19: 2 mg via INTRAVENOUS
  Filled 2023-10-19: qty 1

## 2023-10-19 MED ORDER — IODIXANOL 320 MG/ML IV SOLN
INTRAVENOUS | Status: DC | PRN
  Administered 2023-10-19: 125 mL

## 2023-10-19 MED ORDER — SODIUM CHLORIDE 0.9 % IV SOLN: 250.0000 mL | INTRAVENOUS | Status: DC | PRN

## 2023-10-19 MED ORDER — SODIUM CHLORIDE 0.9% FLUSH
3.0000 mL | Freq: Two times a day (BID) | INTRAVENOUS | Status: DC
  Administered 2023-10-19: 3 mL via INTRAVENOUS

## 2023-10-19 MED ORDER — SODIUM CHLORIDE 0.9 % IV SOLN: INTRAVENOUS | Status: AC

## 2023-10-19 MED ORDER — AMLODIPINE BESYLATE 5 MG PO TABS
5.0000 mg | ORAL_TABLET | Freq: Every day | ORAL | Status: DC
  Administered 2023-10-19 – 2023-10-20 (×2): 5 mg via ORAL
  Filled 2023-10-19 (×2): qty 1

## 2023-10-19 MED ORDER — HEPARIN SODIUM (PORCINE) 1000 UNIT/ML IJ SOLN
INTRAMUSCULAR | Status: AC
  Filled 2023-10-19: qty 10

## 2023-10-19 MED ORDER — ASPIRIN 325 MG PO TBEC: 325.0000 mg | DELAYED_RELEASE_TABLET | Freq: Every day | ORAL | Status: DC

## 2023-10-19 MED ORDER — EVOLOCUMAB 140 MG/ML ~~LOC~~ SOAJ: SUBCUTANEOUS | Status: DC

## 2023-10-19 MED ORDER — ISOSORBIDE MONONITRATE ER 30 MG PO TB24
30.0000 mg | ORAL_TABLET | Freq: Every day | ORAL | Status: DC
  Administered 2023-10-19 – 2023-10-20 (×2): 30 mg via ORAL
  Filled 2023-10-19 (×2): qty 1

## 2023-10-19 MED ORDER — FENTANYL CITRATE (PF) 100 MCG/2ML IJ SOLN
INTRAMUSCULAR | Status: AC
  Filled 2023-10-19: qty 2

## 2023-10-19 MED ORDER — SODIUM CHLORIDE 0.9 % WEIGHT BASED INFUSION
1.0000 mL/kg/h | INTRAVENOUS | Status: DC
  Administered 2023-10-19: 1 mL/kg/h via INTRAVENOUS

## 2023-10-19 MED ORDER — LIDOCAINE HCL (PF) 1 % IJ SOLN
INTRAMUSCULAR | Status: DC | PRN
  Administered 2023-10-19: 12 mL

## 2023-10-19 MED ORDER — HEPARIN (PORCINE) IN NACL 1000-0.9 UT/500ML-% IV SOLN
INTRAVENOUS | Status: DC | PRN
  Administered 2023-10-19 (×2): 500 mL

## 2023-10-19 MED ORDER — SODIUM CHLORIDE 0.9 % WEIGHT BASED INFUSION
3.0000 mL/kg/h | INTRAVENOUS | Status: DC
  Administered 2023-10-19: 3 mL/kg/h via INTRAVENOUS

## 2023-10-19 MED ORDER — MIDAZOLAM HCL 2 MG/2ML IJ SOLN
INTRAMUSCULAR | Status: DC | PRN
  Administered 2023-10-19: 1 mg via INTRAVENOUS

## 2023-10-19 MED ORDER — LIDOCAINE HCL (PF) 1 % IJ SOLN
INTRAMUSCULAR | Status: AC
  Filled 2023-10-19: qty 30

## 2023-10-19 MED ORDER — LEVOTHYROXINE SODIUM 75 MCG PO TABS
75.0000 ug | ORAL_TABLET | Freq: Every day | ORAL | Status: DC
  Administered 2023-10-20: 75 ug via ORAL
  Filled 2023-10-19: qty 1

## 2023-10-19 MED ORDER — CLOPIDOGREL BISULFATE 75 MG PO TABS
75.0000 mg | ORAL_TABLET | Freq: Every day | ORAL | Status: DC
Start: 2023-10-20 — End: 2023-10-20
  Administered 2023-10-20: 75 mg via ORAL
  Filled 2023-10-19: qty 1

## 2023-10-19 MED ORDER — ALUM & MAG HYDROXIDE-SIMETH 200-200-20 MG/5ML PO SUSP
ORAL | Status: AC
  Filled 2023-10-19: qty 30

## 2023-10-19 MED ORDER — ASPIRIN 81 MG PO CHEW
81.0000 mg | CHEWABLE_TABLET | ORAL | Status: AC
  Administered 2023-10-19: 81 mg via ORAL
  Filled 2023-10-19: qty 1

## 2023-10-19 MED ORDER — SODIUM CHLORIDE 0.9% FLUSH: 3.0000 mL | INTRAVENOUS | Status: DC | PRN

## 2023-10-19 MED ORDER — MIDAZOLAM HCL 2 MG/2ML IJ SOLN
INTRAMUSCULAR | Status: AC
  Filled 2023-10-19: qty 2

## 2023-10-19 SURGICAL SUPPLY — 19 items
BAG SNAP BAND KOVER 36X36 (MISCELLANEOUS) IMPLANT
BALLN CHOCOLATE 3.0X40X150 (BALLOONS) ×2
BALLOON CHOCOLATE 3.0X40X150 (BALLOONS) IMPLANT
CATH ANGIO 5F PIGTAIL 65CM (CATHETERS) IMPLANT
CATH CROSS OVER TEMPO 5F (CATHETERS) IMPLANT
CATH STRAIGHT 5FR 65CM (CATHETERS) IMPLANT
COVER DOME SNAP 22 D (MISCELLANEOUS) IMPLANT
DCB RANGER 4.0X40 135 (BALLOONS) IMPLANT
KIT ENCORE 26 ADVANTAGE (KITS) IMPLANT
KIT PV (KITS) ×2 IMPLANT
RANGER DCB 4.0X40 135 (BALLOONS) ×2
SET ATX-X65L (MISCELLANEOUS) IMPLANT
SHEATH CATAPULT 6FR 60 (SHEATH) IMPLANT
SHEATH PINNACLE 5F 10CM (SHEATH) IMPLANT
SHEATH PINNACLE 6F 10CM (SHEATH) IMPLANT
TAPE SHOOT N SEE (TAPE) IMPLANT
WIRE HITORQ VERSACORE ST 145CM (WIRE) IMPLANT
WIRE SHEPHERD 4G .014 (WIRE) IMPLANT
WIRE VERSACORE LOC 115CM (WIRE) IMPLANT

## 2023-10-19 NOTE — Interval H&P Note (Signed)
History and Physical Interval Note:  10/19/2023 9:09 AM  Jared [name]  has presented today for surgery, with the diagnosis of pad.  The various methods of treatment have been discussed with the patient and family. After consideration of risks, benefits and other options for treatment, the patient has consented to  Procedure(s): ABDOMINAL AORTOGRAM W/LOWER EXTREMITY (N/A) as a surgical intervention.  The patient's history has been reviewed, patient examined, no change in status, stable for surgery.  I have reviewed the patient's chart and labs.  Questions were answered to the patient's satisfaction.     Nanetta Batty

## 2023-10-19 NOTE — Telephone Encounter (Signed)
Abdominal aortogram done 10/19/23.

## 2023-10-19 NOTE — Progress Notes (Signed)
Order for sheath removal verified per post procedural orders. Procedure explained to patient and Rt femoral artery access site assessed: level 0, palpable dorsalis pedis and posterior tibial pulses. 6 French Sheath ACT was 158 removed and manual pressure applied for 60 minutes. Pre, peri, & post procedural vitals: HR 70, RR 15-19, O2 Sat upper 95-100% BP 150-160/80,, Pain 0. Distal pulses LPT remained intact after sheath removal. Access site level 0 and dressed with 4X4 gauze and tegaderm. Post procedural instructions discussed and return demonstration from patient.

## 2023-10-19 NOTE — Progress Notes (Addendum)
   Called to bedside, RN reports patient with new onset of left groin sharp pain, no active bleeding, pedal pulse +, no sensory loss.   Evaluated the patient at beds die, he is alert and oriented x3, BP 150/70. Left groin with dressing in place. Surrounding ecchymosis noted, which RN reports this is new. Hard lump area palpated. + Tenderness. LLE pedal pulse 1+. Left leg and foot warm,  no gross sensory loss, no motor limitation.   Reviewed with Dr Allyson Sabal, recommend have DOD evaluate the patient in person.   Called Dr Royann Shivers who examined the patient, agree with checking STAT CTAP. Patient had documented contrast allergy due to "one kidney". Cr 1.37 today. He had received contrast today without pre-medication for allergy before PV case, no allergic reaction. Will give contrast with CTAP for optimal imaging to evaluate retroperitoneal  bleeding. Please consider update allergy list.   Follow up:  CTAP showed Colonic diverticulosis with uncomplicated acute diverticulitis of the distal descending and proximal sigmoid colon.  Left inguinal regional hematoma suggestive of recent femoral approach procedure and access. A 3.2 x 2.4 and a 2.5 x 1.1 cm hematoma formation within the left inguinal region just superficial to the expected femoral access.Multiple pulmonary nodules. Most significant: Left solid pulmonary nodule measuring 17 mm detected on incomplete chest CT.  Acute uncomplicated diverticulitis of the distal descending and proximal sigmoid colon - new onset LLQ groin pain post PV procedure today, called the patient back at 0720pm, he states pain is much improved after receiving PRN Norco, no nausea or emesis, no fever or chills, he recalls having diverticulitis 10 years ago with much severe abdominal pain and required inpatient antibiotic but no surgery  - will trend WBC, look for worsening of leukocytosis  - will check CRP - monitor for fever curve and improvement of abdominal pain - please  maintain NPO, bowel rest tonight, continue post cath IVF for hydration  - if he has worsening of abdominal pain with additional GI symptoms and worsening inflammatory markers, would start a 7 days course of oral Augmentin versus IV Cipro + flagyl while in house, consider general surgery consult for further evaluation tomorrow (per review of literature, current guideline does not recommend initiation of antibiotic for uncomplicated diverticulitis unless patient has significant comorbidity, refractory symptoms, systemic infection signs, immunosuppression)  - updated provided to the patient over the phone, he understand and agreed   Left inguinal regional hematoma - consistent with clinical exam - trend CBC tomorrow

## 2023-10-20 ENCOUNTER — Other Ambulatory Visit (HOSPITAL_COMMUNITY): Payer: Self-pay

## 2023-10-20 ENCOUNTER — Encounter (HOSPITAL_COMMUNITY): Payer: Self-pay | Admitting: Cardiovascular Disease

## 2023-10-20 DIAGNOSIS — Z955 Presence of coronary angioplasty implant and graft: Secondary | ICD-10-CM | POA: Diagnosis not present

## 2023-10-20 DIAGNOSIS — R918 Other nonspecific abnormal finding of lung field: Secondary | ICD-10-CM | POA: Diagnosis not present

## 2023-10-20 DIAGNOSIS — I739 Peripheral vascular disease, unspecified: Secondary | ICD-10-CM

## 2023-10-20 DIAGNOSIS — Z79899 Other long term (current) drug therapy: Secondary | ICD-10-CM | POA: Diagnosis not present

## 2023-10-20 DIAGNOSIS — I70211 Atherosclerosis of native arteries of extremities with intermittent claudication, right leg: Secondary | ICD-10-CM | POA: Diagnosis not present

## 2023-10-20 DIAGNOSIS — K5732 Diverticulitis of large intestine without perforation or abscess without bleeding: Secondary | ICD-10-CM | POA: Diagnosis not present

## 2023-10-20 DIAGNOSIS — Z7902 Long term (current) use of antithrombotics/antiplatelets: Secondary | ICD-10-CM | POA: Diagnosis not present

## 2023-10-20 DIAGNOSIS — K829 Disease of gallbladder, unspecified: Secondary | ICD-10-CM | POA: Diagnosis not present

## 2023-10-20 DIAGNOSIS — I25119 Atherosclerotic heart disease of native coronary artery with unspecified angina pectoris: Secondary | ICD-10-CM | POA: Diagnosis not present

## 2023-10-20 LAB — BASIC METABOLIC PANEL
Anion gap: 9 (ref 5–15)
BUN: 25 mg/dL — ABNORMAL HIGH (ref 8–23)
CO2: 20 mmol/L — ABNORMAL LOW (ref 22–32)
Calcium: 8.4 mg/dL — ABNORMAL LOW (ref 8.9–10.3)
Chloride: 108 mmol/L (ref 98–111)
Creatinine, Ser: 1.27 mg/dL — ABNORMAL HIGH (ref 0.61–1.24)
GFR, Estimated: 59 mL/min — ABNORMAL LOW (ref >60)
Glucose, Bld: 108 mg/dL — ABNORMAL HIGH (ref 70–99)
Potassium: 4.1 mmol/L (ref 3.5–5.1)
Sodium: 137 mmol/L (ref 135–145)

## 2023-10-20 LAB — CBC WITH DIFFERENTIAL/PLATELET
Abs Immature Granulocytes: 0.08 10*3/uL — ABNORMAL HIGH (ref 0.00–0.07)
Basophils Absolute: 0.1 10*3/uL (ref 0.0–0.1)
Basophils Relative: 1 %
Eosinophils Absolute: 0.4 10*3/uL (ref 0.0–0.5)
Eosinophils Relative: 3 %
HCT: 34.7 % — ABNORMAL LOW (ref 39.0–52.0)
Hemoglobin: 11.2 g/dL — ABNORMAL LOW (ref 13.0–17.0)
Immature Granulocytes: 1 %
Lymphocytes Relative: 19 %
Lymphs Abs: 2.3 10*3/uL (ref 0.7–4.0)
MCH: 26.2 pg (ref 26.0–34.0)
MCHC: 32.3 g/dL (ref 30.0–36.0)
MCV: 81.3 fL (ref 80.0–100.0)
Monocytes Absolute: 1.4 10*3/uL — ABNORMAL HIGH (ref 0.1–1.0)
Monocytes Relative: 12 %
Neutro Abs: 7.8 10*3/uL — ABNORMAL HIGH (ref 1.7–7.7)
Neutrophils Relative %: 64 %
Platelets: 304 10*3/uL (ref 150–400)
RBC: 4.27 MIL/uL (ref 4.22–5.81)
RDW: 15.3 % (ref 11.5–15.5)
WBC: 12 10*3/uL — ABNORMAL HIGH (ref 4.0–10.5)
nRBC: 0 % (ref 0.0–0.2)

## 2023-10-20 LAB — LIPID PANEL
Cholesterol: 138 mg/dL (ref 0–200)
HDL: 25 mg/dL — ABNORMAL LOW (ref >40)
LDL Cholesterol: 77 mg/dL (ref 0–99)
Total CHOL/HDL Ratio: 5.5 {ratio}
Triglycerides: 180 mg/dL — ABNORMAL HIGH (ref <150)
VLDL: 36 mg/dL (ref 0–40)

## 2023-10-20 MED ORDER — PANTOPRAZOLE SODIUM 40 MG PO TBEC
40.0000 mg | DELAYED_RELEASE_TABLET | Freq: Every day | ORAL | 1 refills | Status: DC
  Filled 2023-10-20: qty 30, 30d supply, fill #0

## 2023-10-20 MED ORDER — ASPIRIN 81 MG PO TBEC
81.0000 mg | DELAYED_RELEASE_TABLET | Freq: Every day | ORAL | 12 refills | Status: AC
  Filled 2023-10-20: qty 30, 30d supply, fill #0

## 2023-10-20 MED ORDER — CLOPIDOGREL BISULFATE 75 MG PO TABS
75.0000 mg | ORAL_TABLET | Freq: Every day | ORAL | 2 refills | Status: DC
  Filled 2023-10-20: qty 90, 90d supply, fill #0

## 2023-10-20 NOTE — Progress Notes (Addendum)
Discharge instructions reviewed with pt, no questions at this time.  Copy of instructions given to pt. [facility] has filled pt's scripts and will be picked up on the way out for discharge when his wife arrives. Pt's wife will be here in less than 20 minutes she states. Pt finishing his late breakfast during discharge instructions, and will get dressed. Pt's wife to call when she arrives at the front entrance.   Pt will be d/c'd via wheelchair with belongings, and will be escorted by staff/hospital volunteer.   Annice Needy, RN SWOT   At 1142--ride is here, volunteer called to take pt to [facility] and to main entrance.

## 2023-10-20 NOTE — Discharge Summary (Addendum)
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 known as: PLAVIX Take 1 tablet (75 mg total) by mouth daily with breakfast. Start taking on: October 21, 2023   CO Q-10 PO Take 1 capsule by mouth daily.   DULoxetine 60 MG capsule Commonly known as: CYMBALTA Take 60 mg by mouth every morning.   EPINEPHrine 0.3 mg/0.3 mL Soaj injection Commonly known as: EPI-PEN Inject 0.3 mg into the muscle as needed for anaphylaxis.   isosorbide mononitrate 30 MG 24 hr tablet Commonly known as: IMDUR take 1 tablet by mouth once daily   levothyroxine 75 MCG tablet Commonly known as: SYNTHROID Take 75 mcg by mouth daily before breakfast.   metoprolol succinate 25 MG 24 hr tablet Commonly known as: TOPROL-XL Take 25 mg by mouth at bedtime.  Mounjaro 5 MG/0.5ML Pen Generic drug: tirzepatide Inject 5 mg into the skin once a week.   multivitamin tablet Take 1 tablet by mouth at bedtime.   Nucala 100 MG/ML Sosy Generic drug: mepolizumab Inject 1 mL into the skin every 30 (thirty) days.   optichamber diamond Misc 1 each by Other route daily. Use as directed with inhaler.   pantoprazole 40 MG tablet Commonly known as: Protonix Take 1  tablet (40 mg total) by mouth daily.   predniSONE 20 MG tablet Commonly known as: DELTASONE Take 2 tablets (40 mg total) by mouth daily.   Repatha SureClick 140 MG/ML Soaj Generic drug: Evolocumab Inject 1 Pen into the skin every 14 (fourteen) days.   sodium chloride 0.65 % Soln nasal spray Commonly known as: OCEAN Place 1 spray into both nostrils as needed for congestion.   tamsulosin 0.4 MG Caps capsule Commonly known as: FLOMAX Take 0.4 mg by mouth daily.   zinc gluconate 50 MG tablet Take 50 mg by mouth daily.           Outstanding Labs/Studies   Follow up dopplers  Duration of Discharge Encounter: APP Time: 15 minutes   Signed, Laverda Page, NP 10/20/2023, 10:37 AM  I have personally seen and examined this patient. I agree with the assessment and plan as outlined above.  73 yo male with history of CAD, HTN, HLD, PAD with recent right leg claudication. Pt admitted following PV intervention. Dr. Allyson Sabal performed right TPT trunk balloon angioplasty and DCB therapy. Left groin pain post procedure. CT with no evidence of RP bleeding. He is doing well this am. Left groin is sore with ecchymosis, no hematoma.  No complaint today except for sore left groin.  My exam: NAD, CV:RRR Ext: Both legs warm to touch. Left groin with ecchymosis, no hematoma Plan: PAD: Doing well post PV intervention as above. Will plan to discharge home today with plans for DAPT with ASA and Plavix.   Verne Carrow, MD, [facility] 10/20/2023 10:37 AM

## 2023-10-23 ENCOUNTER — Ambulatory Visit: Payer: Medicare PPO | Admitting: Family Medicine

## 2023-10-23 ENCOUNTER — Ambulatory Visit: Payer: Medicare PPO | Admitting: Cardiovascular Disease

## 2023-10-24 ENCOUNTER — Encounter: Payer: Self-pay | Admitting: Cardiology

## 2023-10-24 ENCOUNTER — Ambulatory Visit: Payer: Medicare PPO | Attending: Cardiology | Admitting: Cardiology

## 2023-10-24 ENCOUNTER — Telehealth: Payer: Self-pay | Admitting: Cardiovascular Disease

## 2023-10-24 VITALS — BP 120/60 | HR 90 | Ht 67.0 in | Wt 211.8 lb

## 2023-10-24 DIAGNOSIS — I739 Peripheral vascular disease, unspecified: Secondary | ICD-10-CM | POA: Diagnosis not present

## 2023-10-24 DIAGNOSIS — I10 Essential (primary) hypertension: Secondary | ICD-10-CM

## 2023-10-24 DIAGNOSIS — I97638 Postprocedural hematoma of a circulatory system organ or structure following other circulatory system procedure: Secondary | ICD-10-CM

## 2023-10-24 NOTE — Progress Notes (Signed)
 Cardiology Office Note:  .   Date:  10/24/2023  ID:  Jared [name], DOB Apr 27, 1951, MRN [id number] PCP: Shepard Ade, MD  [facility] Providers Cardiologist:  Dorn Lesches, MD     Chief Complaint  Patient presents with   Hospitalization Follow-up    Left groin complication-swelling and pain    Patient Profile: .     Jared [name] is an obese 73 y.o. male with a PMH notable for CAD (RCA PCI) and PAD as well as HTN and HLD who presents here for hospital follow-up to discuss groin hematoma.  He follows up with the clinic at the request of Shepard Ade, MD.  He presents as a walk-in visit with complaints of significant pain in the left groin post PV cath by Dr. Lesches Jared [name] was last seen on October 03, 2023 by Dr. Lesches with complaints of right calf claudication.  LDA Dopplers in October 24 showed right ABI of 0.87 in the left 5.  There is a high-frequency signal on the right posterior tibial artery.  He failed cilostazol  and therefore was scheduled for invasive evaluation 1 relook study showed possible TP trunk stenosis in the right leg. => Post cath patient was noted to have a left inguinal renal hematoma evaluated with a CT scan that did not show any evidence of RP bleed.  He was monitored overnight and discharged home essentially stable condition.  Several of the findings were noted of diverticulitis and lung nodules.  He was given some pain meds on discharge.  Subjective  Jared [name] presents here today for early posthospital follow-up.  He says that once he ran out of the pain medications were given in the hospital, he started having more significant pain in his left leg but this morning he woke with a sharp pain deep in his leg and felt that the leg swelling was firmer and may be larger.  No significant bruising and some redness, but it was not necessarily warm to touch.  He has not had any fevers or chills.  He has not had any syncope or near  syncope.  No dyspnea. At baseline he is limited because of right hip pain with osteoarthritis and actually had potential hip surgery scheduled until this procedure was performed.  He said that after the initial pain went away, really only notes the symptoms if there is pressure from his pants or from someone touching the groin.  Walking sometimes can exacerbate especially incident over.  The pain has not progressively gotten worse.  Cardiovascular ROS: no chest pain or dyspnea on exertion positive for - right groin pain with significant bruising is noted. negative for - more than usual exertional dyspnea or syncope/near-syncope.   Objective    Studies Reviewed: SABRA        RCA PCI (2000)-Dr. L. Little Cardiac Cath (2000) widely patent RCA stent, 30% Left Main Stenosis. Most recent Myoview (06/26/2013): Normal LV size and function.  EF 57%.  No ischemia or infarction. Echocardiogram (04/13/2023): Normal LV size and function.  EF 60 to 65%.  No RWMA.  GR 1 DD.  Normal RV.  Normal valves.  Normal RAP.  PV Procedure abdominal aorta gram and bilateral iliac angiogram with bilateral femoral runoff;  => PTCA 90% right tibioperoneal trunk using chocolate balloon PTCA followed by [facility] CT Abdomen Pelvis with Contrast: Left inguinal region hematoma suggestive of recent femoral approach access (3.2 x 2.4 and a 2.5 x 11 cm hematoma formation  within the left inguinal region just superficial to the expected femoral access.     Latest Ref Rng & Units 10/20/2023    3:22 AM 10/03/2023    4:45 PM 02/17/2023    9:30 AM  CBC  WBC 4.0 - 10.5 K/uL 12.0  14.0  13.8   Hemoglobin 13.0 - 17.0 g/dL 88.7  86.1  86.1   Hematocrit 39.0 - 52.0 % 34.7  44.7  43.2   Platelets 150 - 400 K/uL 304  393  401     Risk Assessment/Calculations:             Physical Exam:   VS:  BP 120/60 (BP Location: Left Arm, Patient Position: Sitting, Cuff Size: Normal)   Pulse 90   Ht 5' 7 (1.702 m)   Wt 211 lb 12.8 oz (96.1 kg)    SpO2 96%   BMI 33.17 kg/m    Wt Readings from Last 3 Encounters:  10/24/23 211 lb 12.8 oz (96.1 kg)  10/19/23 210 lb (95.3 kg)  10/03/23 211 lb (95.7 kg)    GEN: Well nourished, well groomed in no acute distress; obese but otherwise relatively healthy appearing. CARDIAC: Normal S1, S2; RRR, no murmurs, rubs, gallops RESPIRATORY:  Clear to auscultation without rales, wheezing or rhonchi ; nonlabored, good air movement. ABDOMEN: Obese, soft, nondistended.  There is tenderness in the lower abdomen mostly in the suprainguinal region. EXTREMITIES: Notable bruising around the access site on the left femoral as well as inguinal region.  There is soft tissues below the access site above the access site in the lower abdomen and leading into the scrotal area there is significant firm and tender area.  It does not appear to be pulsatile but it is definitely firm on deep compression.  It is not fluctuant and there is no signs of infection/redness etc.    ASSESSMENT AND PLAN: .    Problem List Items Addressed This Visit       Cardiology Problems   Essential hypertension   Peripheral arterial disease (HCC)   Status post PTCA of the TP trunk on the right.  He is still noticing some discomfort on the right leg, but really has not done much walking to [facility] there has been an benefit.  Unfortunately the procedure was complicated by a sizable hematoma on left groin.  To complicate matters he is on antiplatelet agents with aspirin  and Plavix  because of recent PTCA. -Continue aspirin  and Plavix  along with his other CV medications. -On Repatha  for lipids And managed by his primary cardiologist.  He is also on co-Q10, but not on statin He is on Imdur  30 mg, Toprol  25 mg, amlodipine  5 mg for BP/antianginal benefit. On Mounjaro      Relevant Orders   VAS US  GROIN PSEUDOANEURYSM   CBC     Other   Hematoma following percutaneous transluminal coronary angioplasty - Primary   Sizable left inguinal  hematoma that is somewhat firm in the suprapubic area above the access site.  I personally reviewed the CT scans and this is the location where the majority of the hematoma was.  I am concerned that it is still firm and potentially under pressure.  I do not think that there is any extra extravasation however we will check a CBC since there was a drop in hemoglobin postprocedure.  He is certainly not acting like hemorrhagic shock. Will check urgent femoral arterial Dopplers to exclude pseudoaneurysm which can potentially be closed if indicated I recommend  he uses ice tonight 20 minutes on 30 minutes off and then starting tomorrow hot compresses to help with absorption of the hematoma.      Relevant Orders   VAS US  GROIN PSEUDOANEURYSM   CBC   Follow-Up: Return in about 15 days (around 11/08/2023) for Follow-up with Dr. Court.  I spent 45 minutes in the care of Jared [name] today including reviewing labs (2 min), reviewing studies (7 minutes reviewing prior cardiac studies but also more prominently in the recent PV procedure), face to face time discussing treatment options (23 minutes including physical examination and discussion of plan.), reviewing records from discharge summary and prehospitalization clinic notes (6 minutes), 7 minutes dictating, and documenting in the encounter.     Signed, Jared MICAEL Clay, MD, MS Jared [name], M.D., M.S. Interventional Cardiologist  [facility]  Pager # [phone] Phone # [phone] [address] [place], [place] [address]

## 2023-10-24 NOTE — Telephone Encounter (Signed)
 Called and spoke with patient and wife. Stares had procedure on 1/30 with Dr Court. Site is bruised from left pelvis across entire abdomen and into left thigh. States area is swollen including the genitals and warm to touch. Genitals are bright red in color that is different from the bruising. Appointment made for 3:30 today with DOD to evaluate.

## 2023-10-24 NOTE — Assessment & Plan Note (Signed)
 Sizable left inguinal hematoma that is somewhat firm in the suprapubic area above the access site.  I personally reviewed the CT scans and this is the location where the majority of the hematoma was.  I am concerned that it is still firm and potentially under pressure.  I do not think that there is any extra extravasation however we will check a CBC since there was a drop in hemoglobin postprocedure.  He is certainly not acting like hemorrhagic shock. Will check urgent femoral arterial Dopplers to exclude pseudoaneurysm which can potentially be closed if indicated I recommend he uses ice tonight 20 minutes on 30 minutes off and then starting tomorrow hot compresses to help with absorption of the hematoma.

## 2023-10-24 NOTE — Patient Instructions (Signed)
 Medication Instructions:   No changes *If you need a refill on your cardiac medications before your next appointment, please call your pharmacy*   Lab Work: Not needed    Testing/Procedures:   Schedule for doppler  - groin pseudoaneurysm at [facility]      Follow-Up: At [facility], you and your health needs are our priority.  As part of our continuing mission to provide you with exceptional heart care, we have created designated Provider Care Teams.  These Care Teams include your primary Cardiologist (physician) and Advanced Practice Providers (APPs -  Physician Assistants and Nurse Practitioners) who all work together to provide you with the care you need, when you need it.     Your next appointment:    Keep appt 11/08/23 The format for your next appointment:   In Person  Provider:   Dorn Lesches, MD    Other Instructions    Tonight  place ice pack on the affected area for at least 10 - 15 min then wait  then repeat once it become soft place warm compress

## 2023-10-24 NOTE — Telephone Encounter (Signed)
Patient states post-op he is very bruised and this morning he had a sharp pain in his left groin that woke him from his sleep.

## 2023-10-24 NOTE — Assessment & Plan Note (Addendum)
 Status post PTCA of the TP trunk on the right.  He is still noticing some discomfort on the right leg, but really has not done much walking to [facility] there has been an benefit.  Unfortunately the procedure was complicated by a sizable hematoma on left groin.  To complicate matters he is on antiplatelet agents with aspirin  and Plavix  because of recent PTCA. -Continue aspirin  and Plavix  along with his other CV medications. -On Repatha  for lipids And managed by his primary cardiologist.  He is also on co-Q10, but not on statin He is on Imdur  30 mg, Toprol  25 mg, amlodipine  5 mg for BP/antianginal benefit. On Mounjaro

## 2023-10-25 ENCOUNTER — Encounter (HOSPITAL_COMMUNITY): Payer: Self-pay

## 2023-10-25 ENCOUNTER — Other Ambulatory Visit: Payer: Self-pay

## 2023-10-25 ENCOUNTER — Ambulatory Visit (HOSPITAL_COMMUNITY)
Admission: RE | Admit: 2023-10-25 | Discharge: 2023-10-25 | Disposition: A | Discharge status: Home / Self Care | Payer: Medicare PPO | Source: Ambulatory Visit | Attending: *Deleted | Admitting: *Deleted

## 2023-10-25 ENCOUNTER — Ambulatory Visit (HOSPITAL_COMMUNITY)
Admission: RE | Admit: 2023-10-25 | Discharge: 2023-10-25 | Disposition: A | Discharge status: Home / Self Care | Payer: Medicare PPO | Source: Ambulatory Visit | Attending: Cardiology | Admitting: Cardiology

## 2023-10-25 VITALS — BP 143/73 | HR 71 | Temp 98.0°F | Resp 16 | Ht 67.0 in | Wt 210.0 lb

## 2023-10-25 DIAGNOSIS — R1030 Lower abdominal pain, unspecified: Secondary | ICD-10-CM | POA: Diagnosis not present

## 2023-10-25 DIAGNOSIS — I97638 Postprocedural hematoma of a circulatory system organ or structure following other circulatory system procedure: Secondary | ICD-10-CM | POA: Diagnosis not present

## 2023-10-25 DIAGNOSIS — I739 Peripheral vascular disease, unspecified: Secondary | ICD-10-CM

## 2023-10-25 MED ORDER — THROMBIN FOR PERCUTANEOUS TREATMENT OF PSEUDOANEURYSM (5000UNITS/10ML)
5000.0000 [IU] | Freq: Once | PERCUTANEOUS | Status: AC
Start: 2023-10-25 — End: 2023-10-25
  Administered 2023-10-25: 5000 [IU] via PERCUTANEOUS
  Filled 2023-10-25 (×2): qty 1

## 2023-10-25 MED ORDER — LIDOCAINE HCL (PF) 1 % IJ SOLN
30.0000 mL | Freq: Once | INTRAMUSCULAR | Status: DC
  Filled 2023-10-25: qty 30

## 2023-10-25 MED ORDER — MORPHINE SULFATE (PF) 2 MG/ML IV SOLN
2.0000 mg | Freq: Once | INTRAVENOUS | Status: AC
  Administered 2023-10-25: 2 mg via INTRAVENOUS
  Filled 2023-10-25: qty 2

## 2023-10-25 NOTE — Progress Notes (Signed)
 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 Preliminary results can be found in CV Proc through chart review.   10/25/23 9:59 AM Birda Buffy RVT

## 2023-10-27 DIAGNOSIS — M1612 Unilateral primary osteoarthritis, left hip: Secondary | ICD-10-CM | POA: Diagnosis not present

## 2023-10-27 DIAGNOSIS — G20A1 Parkinson's disease without dyskinesia, without mention of fluctuations: Secondary | ICD-10-CM | POA: Diagnosis not present

## 2023-10-30 ENCOUNTER — Ambulatory Visit: Payer: Medicare PPO | Admitting: Cardiology

## 2023-10-30 ENCOUNTER — Telehealth: Payer: Self-pay | Admitting: Cardiovascular Disease

## 2023-10-30 DIAGNOSIS — E785 Hyperlipidemia, unspecified: Secondary | ICD-10-CM | POA: Diagnosis not present

## 2023-10-30 DIAGNOSIS — E1122 Type 2 diabetes mellitus with diabetic chronic kidney disease: Secondary | ICD-10-CM | POA: Diagnosis not present

## 2023-10-30 DIAGNOSIS — I2510 Atherosclerotic heart disease of native coronary artery without angina pectoris: Secondary | ICD-10-CM | POA: Diagnosis not present

## 2023-10-30 DIAGNOSIS — E669 Obesity, unspecified: Secondary | ICD-10-CM | POA: Diagnosis not present

## 2023-10-30 DIAGNOSIS — F419 Anxiety disorder, unspecified: Secondary | ICD-10-CM | POA: Diagnosis not present

## 2023-10-30 DIAGNOSIS — N1831 Chronic kidney disease, stage 3a: Secondary | ICD-10-CM | POA: Diagnosis not present

## 2023-10-30 DIAGNOSIS — J449 Chronic obstructive pulmonary disease, unspecified: Secondary | ICD-10-CM | POA: Diagnosis not present

## 2023-10-30 DIAGNOSIS — E039 Hypothyroidism, unspecified: Secondary | ICD-10-CM | POA: Diagnosis not present

## 2023-10-30 DIAGNOSIS — I129 Hypertensive chronic kidney disease with stage 1 through stage 4 chronic kidney disease, or unspecified chronic kidney disease: Secondary | ICD-10-CM | POA: Diagnosis not present

## 2023-10-30 NOTE — Telephone Encounter (Signed)
 Patient identification verified by 2 forms. Hilton Lucky, RN    Called and spoke to patient  Patient states:   -has pain in right calf   -can't walk more than 20 feet without severe pain  -feels like pain prior to surgery   -Pain started 10/22/23  -did not discuss pain at 2/4 OV   -he is no longer having the groin pain   -has leg and groin US  scheduled on 2/14  -would like a sooner ultrasound appointment  Patient denies:   -discoloration in leg    -Numbness/tingling   -weakness in leg   -redness/warm to touch  Informed patient nursing will outreach with further recommendations  Patient agrees, no questions at this time

## 2023-10-30 NOTE — Telephone Encounter (Signed)
 Patient identification verified by 2 forms. Hilton Lucky, RN    Called and spoke to patient  Informed patient:   -Per Dr. Katheryne Pane, he does not think the pain is related to intervention and he doesn't want to move the dopplers bc he wants that amount of time to go by before re-checking the pseudoaneurysm  Patient OV scheduled for 2/17 at 1:30pm  Patient has no further questions at this time

## 2023-10-30 NOTE — Telephone Encounter (Signed)
 Patient called stating he is having serve right calf pain, like he had before his surgery.  Patient states it not swollen, warm or red, he is just having very bad pain.  He states it "feels like it's in a vice".

## 2023-11-03 ENCOUNTER — Ambulatory Visit (HOSPITAL_BASED_OUTPATIENT_CLINIC_OR_DEPARTMENT_OTHER)
Admission: RE | Admit: 2023-11-03 | Discharge: 2023-11-03 | Disposition: A | Discharge status: Home / Self Care | Payer: Medicare PPO | Source: Ambulatory Visit | Attending: Cardiovascular Disease | Admitting: Cardiovascular Disease

## 2023-11-03 ENCOUNTER — Ambulatory Visit (HOSPITAL_COMMUNITY): Payer: Medicare PPO

## 2023-11-03 ENCOUNTER — Encounter (HOSPITAL_COMMUNITY): Payer: Medicare PPO

## 2023-11-03 ENCOUNTER — Ambulatory Visit (HOSPITAL_COMMUNITY)
Admission: RE | Admit: 2023-11-03 | Discharge: 2023-11-03 | Disposition: A | Discharge status: Home / Self Care | Payer: Medicare PPO | Source: Ambulatory Visit | Attending: Internal Medicine | Admitting: Internal Medicine

## 2023-11-03 DIAGNOSIS — I739 Peripheral vascular disease, unspecified: Secondary | ICD-10-CM

## 2023-11-03 LAB — VAS US ABI WITH/WO TBI
Left ABI: 1.1
Right ABI: 1.03

## 2023-11-06 ENCOUNTER — Ambulatory Visit: Payer: Medicare PPO | Attending: Cardiovascular Disease | Admitting: Cardiovascular Disease

## 2023-11-06 ENCOUNTER — Encounter: Payer: Self-pay | Admitting: Cardiovascular Disease

## 2023-11-06 VITALS — BP 130/76 | HR 77 | Ht 67.0 in | Wt 207.6 lb

## 2023-11-06 DIAGNOSIS — I2510 Atherosclerotic heart disease of native coronary artery without angina pectoris: Secondary | ICD-10-CM | POA: Diagnosis not present

## 2023-11-06 NOTE — Progress Notes (Signed)
Jared [name] returns today for follow-up of his PD angiogram performed 10/19/2023.  I found a high-grade lesion in his TPT which I performed chocolate balloon angioplasty and DCB.  His ABI improved to normal.  He did have a small pseudoaneurysm postprocedure which resolved spontaneously and now has some resolving hematoma.  His claudication is improving.  He is on DAPT.  We will recheck lower extremity actual Doppler studies in 4 months I will see him back after that which time we will decide whether or not to discontinue his DAPT to allow him to have his total hip replacement.  Runell Gess, M.D., FACP, [facility], [name] [address] [place], [place]  [address]  [phone] 11/06/2023 2:25 PM

## 2023-11-06 NOTE — Patient Instructions (Addendum)
Medication Instructions:  Your physician recommends that you continue on your current medications as directed. Please refer to the Current Medication list given to you today.  *If you need a refill on your cardiac medications before your next appointment, please call your pharmacy*   Testing/Procedures: Your physician has requested that you have a lower extremity arterial duplex. During this test, ultrasound is used to evaluate arterial blood flow in the legs. Allow one hour for this exam. There are no restrictions or special instructions. **To be done in June**  Please note: We ask at that you not bring children with you during ultrasound (echo/ vascular) testing. Due to room size and safety concerns, children are not allowed in the ultrasound rooms during exams. Our front office staff cannot provide observation of children in our lobby area while testing is being conducted. An adult accompanying a patient to their appointment will only be allowed in the ultrasound room at the discretion of the ultrasound technician under special circumstances. We apologize for any inconvenience.   Your physician has requested that you have an ankle brachial index (ABI). During this test an ultrasound and blood pressure cuff are used to evaluate the arteries that supply the arms and legs with blood. Allow thirty minutes for this exam. There are no restrictions or special instructions.  **To be done in June**   Please note: We ask at that you not bring children with you during ultrasound (echo/ vascular) testing. Due to room size and safety concerns, children are not allowed in the ultrasound rooms during exams. Our front office staff cannot provide observation of children in our lobby area while testing is being conducted. An adult accompanying a patient to their appointment will only be allowed in the ultrasound room at the discretion of the ultrasound technician under special circumstances. We apologize for any  inconvenience.    Follow-Up: At [facility], you and your health needs are our priority.  As part of our continuing mission to provide you with exceptional heart care, we have created designated Provider Care Teams.  These Care Teams include your primary Cardiologist (physician) and Advanced Practice Providers (APPs -  Physician Assistants and Nurse Practitioners) who all work together to provide you with the care you need, when you need it.  We recommend signing up for the patient portal called "MyChart".  Sign up information is provided on this After Visit Summary.  MyChart is used to connect with patients for Virtual Visits (Telemedicine).  Patients are able to view lab/test results, encounter notes, upcoming appointments, etc.  Non-urgent messages can be sent to your provider as well.   To learn more about what you can do with MyChart, go to [email or web address].    Your next appointment:   4 month(s) shortly after dopplers  Provider:   Nanetta Batty, MD    Other Instructions

## 2023-11-08 ENCOUNTER — Ambulatory Visit: Payer: Medicare PPO | Admitting: Cardiovascular Disease

## 2023-11-14 ENCOUNTER — Other Ambulatory Visit (HOSPITAL_COMMUNITY): Payer: Self-pay

## 2023-11-14 DIAGNOSIS — E119 Type 2 diabetes mellitus without complications: Secondary | ICD-10-CM | POA: Diagnosis not present

## 2023-11-14 DIAGNOSIS — G4730 Sleep apnea, unspecified: Secondary | ICD-10-CM | POA: Diagnosis not present

## 2023-11-14 DIAGNOSIS — E039 Hypothyroidism, unspecified: Secondary | ICD-10-CM | POA: Diagnosis not present

## 2023-11-14 DIAGNOSIS — I10 Essential (primary) hypertension: Secondary | ICD-10-CM | POA: Diagnosis not present

## 2023-11-14 DIAGNOSIS — E669 Obesity, unspecified: Secondary | ICD-10-CM | POA: Diagnosis not present

## 2023-11-14 DIAGNOSIS — R7302 Impaired glucose tolerance (oral): Secondary | ICD-10-CM | POA: Diagnosis not present

## 2023-11-14 DIAGNOSIS — E785 Hyperlipidemia, unspecified: Secondary | ICD-10-CM | POA: Diagnosis not present

## 2023-11-14 DIAGNOSIS — I739 Peripheral vascular disease, unspecified: Secondary | ICD-10-CM | POA: Diagnosis not present

## 2023-11-22 ENCOUNTER — Other Ambulatory Visit: Payer: Self-pay | Admitting: Cardiovascular Disease

## 2023-11-24 DIAGNOSIS — H1030 Unspecified acute conjunctivitis, unspecified eye: Secondary | ICD-10-CM | POA: Diagnosis not present

## 2023-11-29 DIAGNOSIS — R5383 Other fatigue: Secondary | ICD-10-CM | POA: Diagnosis not present

## 2023-11-29 DIAGNOSIS — E119 Type 2 diabetes mellitus without complications: Secondary | ICD-10-CM | POA: Diagnosis not present

## 2023-11-29 DIAGNOSIS — R051 Acute cough: Secondary | ICD-10-CM | POA: Diagnosis not present

## 2023-11-29 DIAGNOSIS — J189 Pneumonia, unspecified organism: Secondary | ICD-10-CM | POA: Diagnosis not present

## 2023-11-29 DIAGNOSIS — I10 Essential (primary) hypertension: Secondary | ICD-10-CM | POA: Diagnosis not present

## 2023-11-29 DIAGNOSIS — Z1152 Encounter for screening for COVID-19: Secondary | ICD-10-CM | POA: Diagnosis not present

## 2023-11-29 DIAGNOSIS — R0981 Nasal congestion: Secondary | ICD-10-CM | POA: Diagnosis not present

## 2023-12-01 ENCOUNTER — Other Ambulatory Visit: Payer: Self-pay | Admitting: Cardiology

## 2023-12-04 ENCOUNTER — Other Ambulatory Visit: Payer: Self-pay

## 2023-12-04 NOTE — Telephone Encounter (Signed)
 Pt's pharmacy is requesting a refill on medication pantoprazole. This medication was prescribed in the hospital. Would Dr. Allyson Sabal like to refill this medication? Please address

## 2023-12-05 DIAGNOSIS — H6123 Impacted cerumen, bilateral: Secondary | ICD-10-CM | POA: Diagnosis not present

## 2023-12-07 ENCOUNTER — Telehealth: Payer: Self-pay

## 2023-12-07 NOTE — Telephone Encounter (Signed)
   Pre-operative Risk Assessment    Patient Name: Jared [name]  DOB: Nov 10, 1950 MRN: [id number]   Date of last office visit: last ov 11/06/23 with Dr. Allyson Sabal Date of next office visit: 02/26/24 with Dr. Allyson Sabal   Request for Surgical Clearance    Procedure:   Left Total Hip Replacement  Date of Surgery:  Clearance 02/28/24                                 Surgeon:  Dr. Ollen Gross Surgeon's Group or Practice Name:  [facility] Phone number:  [phone] Fax number:  [phone] Attn: Hale Drone   Type of Clearance Requested:   - Medical  - Pharmacy:  Hold Aspirin and Clopidogrel (Plavix)     Type of Anesthesia:  Not Indicated   Additional requests/questions:    Robley Fries   12/07/2023, 3:29 PM

## 2023-12-08 DIAGNOSIS — H9203 Otalgia, bilateral: Secondary | ICD-10-CM | POA: Diagnosis not present

## 2023-12-08 DIAGNOSIS — M26623 Arthralgia of bilateral temporomandibular joint: Secondary | ICD-10-CM | POA: Insufficient documentation

## 2023-12-08 DIAGNOSIS — H9193 Unspecified hearing loss, bilateral: Secondary | ICD-10-CM | POA: Diagnosis not present

## 2023-12-11 DIAGNOSIS — H9313 Tinnitus, bilateral: Secondary | ICD-10-CM | POA: Diagnosis not present

## 2023-12-11 DIAGNOSIS — H906 Mixed conductive and sensorineural hearing loss, bilateral: Secondary | ICD-10-CM | POA: Diagnosis not present

## 2023-12-11 NOTE — Telephone Encounter (Signed)
 Hi Dr. Allyson Sabal,  Just wanted to make certain you are aware of the timeline in regards to patient's scheduled hip replacement for 02/28/2024. I read your note that you want him to await vascular testing and will see him on 02/26/24. Should we advise Dr. Tana Felts office to choose a date a few weeks later?  Thank you, Marcelino Duster

## 2023-12-12 NOTE — Telephone Encounter (Signed)
 Dr. Allyson Sabal,   Can you please provide recommendations on holding DAPT for hip surgery scheduled for 6/11? Per your last note: "He is on DAPT. We will recheck lower extremity actual Doppler studies in 4 months I will see him back after that which time we will decide whether or not to discontinue his DAPT to allow him to have his total hip replacement." He is scheduled for repeat studies on 6/6 and follow up with you on 6/9.   Would you still like him to wait for his repeat studies and follow up before holding DAPT?   Thank you so much for clarifying this. Please route your response to P CV DIV Preop. I will communicate with requesting office once you have given recommendations.   Thank you!  Carlos Levering, NP

## 2023-12-13 ENCOUNTER — Telehealth: Payer: Self-pay | Admitting: *Deleted

## 2023-12-13 NOTE — Telephone Encounter (Signed)
 Preoperative team, patient has upcoming appointment with Dr. Allyson Sabal.  Please add preoperative cardiac evaluation to appointment notes.  Please contact patient and let him know that he may hold his aspirin and Plavix for 5-7 days prior to his surgery.  Final recommendations will be provided by Dr. Allyson Sabal during his upcoming appointment.  Thank you.  Thomasene Ripple. Sakai Wolford NP-C     12/13/2023, 9:03 AM [facility] [address] Office [phone] Fax [phone]

## 2023-12-13 NOTE — Telephone Encounter (Signed)
 Lvm for pt to call office to address blood thinners for pt's upcoming procedure in June.  Pt sees Dr. Allyson Sabal jUne 9, pre op added to appt notes. Pt has to stop blood thinners prior to Dr. Hazle Coca appt.

## 2023-12-18 NOTE — Telephone Encounter (Signed)
 Pt returning a call nurse

## 2023-12-18 NOTE — Telephone Encounter (Signed)
Returned call to patient left message on personal voice mail to call back. 

## 2023-12-19 DIAGNOSIS — F3181 Bipolar II disorder: Secondary | ICD-10-CM | POA: Diagnosis not present

## 2023-12-19 DIAGNOSIS — G4733 Obstructive sleep apnea (adult) (pediatric): Secondary | ICD-10-CM | POA: Diagnosis not present

## 2023-12-19 DIAGNOSIS — F5105 Insomnia due to other mental disorder: Secondary | ICD-10-CM | POA: Diagnosis not present

## 2023-12-19 NOTE — Telephone Encounter (Signed)
 Please see attached message from NP on status of asa and plavix in regards to upcoming 6/11 hip surgery.  Preoperative team, patient has upcoming appointment with Dr. Allyson Sabal.  Please add preoperative cardiac evaluation to appointment notes.  Please contact patient and let him know that he may hold his aspirin and Plavix for 5-7 days prior to his surgery.  Final recommendations will be provided by Dr. Allyson Sabal during his upcoming appointment.  Thank you.   Jared [name]. Cleaver NP- Called and left VMM with above details to stop meds on 6/5 which would be 7 days prior to surgery. Asked patient to please call triage back at [phone] to confirm that he has received this VMM.  Jared How RN [facility].

## 2023-12-19 NOTE — Telephone Encounter (Signed)
 Pt spouse called to confirm that pt received VM and fully understand instructions.

## 2024-01-01 ENCOUNTER — Ambulatory Visit
Admission: RE | Admit: 2024-01-01 | Discharge: 2024-01-01 | Disposition: A | Discharge status: Home / Self Care | Source: Ambulatory Visit | Attending: Registered Nurse | Admitting: Registered Nurse

## 2024-01-01 ENCOUNTER — Other Ambulatory Visit: Payer: Self-pay | Admitting: Registered Nurse

## 2024-01-01 DIAGNOSIS — J189 Pneumonia, unspecified organism: Secondary | ICD-10-CM | POA: Diagnosis not present

## 2024-01-01 DIAGNOSIS — R051 Acute cough: Secondary | ICD-10-CM | POA: Diagnosis not present

## 2024-01-01 DIAGNOSIS — I10 Essential (primary) hypertension: Secondary | ICD-10-CM | POA: Diagnosis not present

## 2024-01-01 DIAGNOSIS — R202 Paresthesia of skin: Secondary | ICD-10-CM | POA: Diagnosis not present

## 2024-01-01 DIAGNOSIS — E119 Type 2 diabetes mellitus without complications: Secondary | ICD-10-CM | POA: Diagnosis not present

## 2024-01-01 DIAGNOSIS — R200 Anesthesia of skin: Secondary | ICD-10-CM | POA: Diagnosis not present

## 2024-01-01 DIAGNOSIS — I2510 Atherosclerotic heart disease of native coronary artery without angina pectoris: Secondary | ICD-10-CM | POA: Diagnosis not present

## 2024-01-01 DIAGNOSIS — R918 Other nonspecific abnormal finding of lung field: Secondary | ICD-10-CM | POA: Diagnosis not present

## 2024-01-01 LAB — LAB REPORT - SCANNED
A1c: 5.9
EGFR (Non-African Amer.): 59
Free T4: 1
TSH: 3.48 (ref 0.41–5.90)

## 2024-01-02 DIAGNOSIS — H6993 Unspecified Eustachian tube disorder, bilateral: Secondary | ICD-10-CM | POA: Diagnosis not present

## 2024-01-02 DIAGNOSIS — H906 Mixed conductive and sensorineural hearing loss, bilateral: Secondary | ICD-10-CM | POA: Insufficient documentation

## 2024-01-16 DIAGNOSIS — I10 Essential (primary) hypertension: Secondary | ICD-10-CM | POA: Diagnosis not present

## 2024-01-16 DIAGNOSIS — J449 Chronic obstructive pulmonary disease, unspecified: Secondary | ICD-10-CM | POA: Diagnosis not present

## 2024-01-16 DIAGNOSIS — R051 Acute cough: Secondary | ICD-10-CM | POA: Diagnosis not present

## 2024-01-16 DIAGNOSIS — R062 Wheezing: Secondary | ICD-10-CM | POA: Diagnosis not present

## 2024-01-18 DIAGNOSIS — G20A1 Parkinson's disease without dyskinesia, without mention of fluctuations: Secondary | ICD-10-CM | POA: Diagnosis not present

## 2024-01-25 DIAGNOSIS — J849 Interstitial pulmonary disease, unspecified: Secondary | ICD-10-CM | POA: Diagnosis not present

## 2024-01-25 DIAGNOSIS — J45909 Unspecified asthma, uncomplicated: Secondary | ICD-10-CM | POA: Diagnosis not present

## 2024-01-25 DIAGNOSIS — J9819 Other pulmonary collapse: Secondary | ICD-10-CM | POA: Diagnosis not present

## 2024-02-07 DIAGNOSIS — G4733 Obstructive sleep apnea (adult) (pediatric): Secondary | ICD-10-CM | POA: Diagnosis not present

## 2024-02-09 ENCOUNTER — Telehealth: Payer: Self-pay

## 2024-02-09 NOTE — Telephone Encounter (Signed)
   Pre-operative Risk Assessment    Patient Name: Jared [name]  DOB: 06/19/1951 MRN: [id number]   Date of last office visit: 11/06/23 Date of next office visit: 02/26/24   Request for Surgical Clearance    Procedure:  Left total hip arthroplasty   Date of Surgery:  Clearance 02/28/24                                 Surgeon:  Dr. France Ina  Surgeon's Group or Practice Name:  [facility]  Phone number:  [phone] Fax number:  [phone]   Type of Clearance Requested:   - Medical  - Pharmacy:  Hold Aspirin  and Clopidogrel  (Plavix ) Not indicated    Type of Anesthesia:  Not Indicated   Additional requests/questions:    [name]   02/09/2024, 5:07 PM

## 2024-02-12 NOTE — Telephone Encounter (Signed)
   Name: Jared [name]  DOB: May 23, 1951  MRN: [id number]  Primary Cardiologist: Lauro Portal, MD  Chart reviewed as part of pre-operative protocol coverage. The patient has an upcoming visit scheduled with Dr. Katheryne Pane on 02/26/2024 at which time clearance can be addressed in case there are any issues that would impact surgical recommendations.    I will route this message as FYI to requesting party and remove this message from the preop box as separate preop APP input not needed at this time.   Please call with any questions.  Francene Ing, Retha Cast, NP  02/12/2024, 10:16 AM

## 2024-02-23 ENCOUNTER — Ambulatory Visit (HOSPITAL_COMMUNITY)
Admission: RE | Admit: 2024-02-23 | Discharge: 2024-02-23 | Disposition: A | Discharge status: Home / Self Care | Payer: Medicare PPO | Source: Ambulatory Visit | Attending: Cardiovascular Disease | Admitting: Cardiovascular Disease

## 2024-02-23 ENCOUNTER — Ambulatory Visit (HOSPITAL_COMMUNITY)
Admission: RE | Admit: 2024-02-23 | Discharge: 2024-02-23 | Disposition: A | Discharge status: Home / Self Care | Source: Ambulatory Visit | Attending: Cardiovascular Disease | Admitting: Cardiovascular Disease

## 2024-02-23 ENCOUNTER — Encounter (HOSPITAL_COMMUNITY): Payer: Medicare PPO

## 2024-02-23 DIAGNOSIS — I739 Peripheral vascular disease, unspecified: Secondary | ICD-10-CM

## 2024-02-23 DIAGNOSIS — I2510 Atherosclerotic heart disease of native coronary artery without angina pectoris: Secondary | ICD-10-CM | POA: Diagnosis not present

## 2024-02-23 DIAGNOSIS — Z95820 Peripheral vascular angioplasty status with implants and grafts: Secondary | ICD-10-CM

## 2024-02-25 ENCOUNTER — Ambulatory Visit: Payer: Self-pay | Admitting: Cardiovascular Disease

## 2024-02-26 ENCOUNTER — Ambulatory Visit: Payer: Medicare PPO | Attending: Cardiology | Admitting: Cardiovascular Disease

## 2024-02-26 ENCOUNTER — Encounter: Payer: Self-pay | Admitting: Cardiovascular Disease

## 2024-02-26 VITALS — BP 112/72 | HR 76 | Ht 67.0 in | Wt 208.2 lb

## 2024-02-26 DIAGNOSIS — Z01818 Encounter for other preprocedural examination: Secondary | ICD-10-CM | POA: Diagnosis not present

## 2024-02-26 DIAGNOSIS — I6521 Occlusion and stenosis of right carotid artery: Secondary | ICD-10-CM | POA: Diagnosis not present

## 2024-02-26 DIAGNOSIS — I739 Peripheral vascular disease, unspecified: Secondary | ICD-10-CM

## 2024-02-26 DIAGNOSIS — I10 Essential (primary) hypertension: Secondary | ICD-10-CM | POA: Diagnosis not present

## 2024-02-26 LAB — VAS US ABI WITH/WO TBI
Left ABI: 1.24
Right ABI: 1.2

## 2024-02-26 NOTE — Assessment & Plan Note (Signed)
 Mr. Jared [name] is being seen today for preoperative clearance of left total hip scheduled for 2 days from now by Dr. Rossie Coon.  He has a history of remote RCA stenting by Dr. Nathen Balder in 2000.  He has normal LV function by 2D echo 04/13/2023 is otherwise asymptomatic.  I am clearing him at low risk without the need of stress test.
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
## 2024-02-26 NOTE — Patient Instructions (Addendum)
 Medication Instructions:  Your physician recommends that you continue on your current medications as directed. Please refer to the Current Medication list given to you today.  *If you need a refill on your cardiac medications before your next appointment, please call your pharmacy*  Follow-Up: At [facility], you and your health needs are our priority.  As part of our continuing mission to provide you with exceptional heart care, our providers are all part of one team.  This team includes your primary Cardiologist (physician) and Advanced Practice Providers or APPs (Physician Assistants and Nurse Practitioners) who all work together to provide you with the care you need, when you need it.  Your next appointment:   6 month(s)  Provider:   Lauro Portal, MD    We recommend signing up for the patient portal called "MyChart".  Sign up information is provided on this After Visit Summary.  MyChart is used to connect with patients for Virtual Visits (Telemedicine).  Patients are able to view lab/test results, encounter notes, upcoming appointments, etc.  Non-urgent messages can be sent to your provider as well.   To learn more about what you can do with MyChart, go to [email or web address].

## 2024-02-28 ENCOUNTER — Other Ambulatory Visit (HOSPITAL_BASED_OUTPATIENT_CLINIC_OR_DEPARTMENT_OTHER): Payer: Self-pay | Admitting: Family

## 2024-02-28 DIAGNOSIS — M1612 Unilateral primary osteoarthritis, left hip: Secondary | ICD-10-CM | POA: Diagnosis not present

## 2024-02-28 DIAGNOSIS — M25752 Osteophyte, left hip: Secondary | ICD-10-CM | POA: Diagnosis not present

## 2024-02-28 HISTORY — PX: OTHER SURGICAL HISTORY: SHX169

## 2024-02-29 ENCOUNTER — Telehealth: Payer: Self-pay | Admitting: Cardiovascular Disease

## 2024-02-29 NOTE — Telephone Encounter (Signed)
 Pt called in stating he had surgery yesterday and was told to stop Plavix . He asked should he restart it and when. Please advise.

## 2024-02-29 NOTE — Telephone Encounter (Signed)
 Pt c/o medication issue:  1. Name of Medication:   clopidogrel  (PLAVIX ) 75 MG tablet   2. How are you currently taking this medication (dosage and times per day)?   Currently not taking  3. Are you having a reaction (difficulty breathing--STAT)?   4. What is your medication issue?   Patient stated he stopped this medication on 6/4 prior to having surgery and wants to know if he can stay off this medication.

## 2024-02-29 NOTE — Telephone Encounter (Signed)
Spoke with pt, aware of dr berry's recommendations. 

## 2024-02-29 NOTE — Telephone Encounter (Signed)
 Spoke with pt, he is wanting to know if he can stay off of the plavix . Explained to the patient he is on that medication due to his PAD and would need to continue. He would like for dr berry to determine if he still needs to take plavix . Will forward for dr berry to review.

## 2024-03-01 ENCOUNTER — Emergency Department (HOSPITAL_COMMUNITY)
Admission: EM | Admit: 2024-03-01 | Discharge: 2024-03-01 | Disposition: A | Discharge status: Home / Self Care | Attending: Emergency Medicine | Admitting: Emergency Medicine

## 2024-03-01 ENCOUNTER — Encounter (HOSPITAL_COMMUNITY): Payer: Self-pay | Admitting: Emergency Medicine

## 2024-03-01 ENCOUNTER — Emergency Department (HOSPITAL_COMMUNITY)

## 2024-03-01 DIAGNOSIS — G2111 Neuroleptic induced parkinsonism: Secondary | ICD-10-CM | POA: Insufficient documentation

## 2024-03-01 DIAGNOSIS — Z79899 Other long term (current) drug therapy: Secondary | ICD-10-CM | POA: Diagnosis not present

## 2024-03-01 DIAGNOSIS — R0789 Other chest pain: Secondary | ICD-10-CM | POA: Insufficient documentation

## 2024-03-01 DIAGNOSIS — Z7982 Long term (current) use of aspirin: Secondary | ICD-10-CM | POA: Diagnosis not present

## 2024-03-01 DIAGNOSIS — Z85528 Personal history of other malignant neoplasm of kidney: Secondary | ICD-10-CM | POA: Insufficient documentation

## 2024-03-01 DIAGNOSIS — I2510 Atherosclerotic heart disease of native coronary artery without angina pectoris: Secondary | ICD-10-CM | POA: Insufficient documentation

## 2024-03-01 DIAGNOSIS — R918 Other nonspecific abnormal finding of lung field: Secondary | ICD-10-CM | POA: Diagnosis not present

## 2024-03-01 DIAGNOSIS — I10 Essential (primary) hypertension: Secondary | ICD-10-CM | POA: Insufficient documentation

## 2024-03-01 DIAGNOSIS — R079 Chest pain, unspecified: Secondary | ICD-10-CM

## 2024-03-01 DIAGNOSIS — E039 Hypothyroidism, unspecified: Secondary | ICD-10-CM | POA: Diagnosis not present

## 2024-03-01 DIAGNOSIS — J45909 Unspecified asthma, uncomplicated: Secondary | ICD-10-CM | POA: Diagnosis not present

## 2024-03-01 DIAGNOSIS — K760 Fatty (change of) liver, not elsewhere classified: Secondary | ICD-10-CM | POA: Diagnosis not present

## 2024-03-01 DIAGNOSIS — J984 Other disorders of lung: Secondary | ICD-10-CM | POA: Diagnosis not present

## 2024-03-01 DIAGNOSIS — Z7902 Long term (current) use of antithrombotics/antiplatelets: Secondary | ICD-10-CM | POA: Insufficient documentation

## 2024-03-01 DIAGNOSIS — R0602 Shortness of breath: Secondary | ICD-10-CM | POA: Diagnosis not present

## 2024-03-01 DIAGNOSIS — R0989 Other specified symptoms and signs involving the circulatory and respiratory systems: Secondary | ICD-10-CM | POA: Diagnosis not present

## 2024-03-01 DIAGNOSIS — J9811 Atelectasis: Secondary | ICD-10-CM | POA: Diagnosis not present

## 2024-03-01 LAB — CBC
HCT: 36.3 % — ABNORMAL LOW (ref 39.0–52.0)
Hemoglobin: 11.3 g/dL — ABNORMAL LOW (ref 13.0–17.0)
MCH: 26.8 pg (ref 26.0–34.0)
MCHC: 31.1 g/dL (ref 30.0–36.0)
MCV: 86 fL (ref 80.0–100.0)
Platelets: 253 10*3/uL (ref 150–400)
RBC: 4.22 MIL/uL (ref 4.22–5.81)
RDW: 15.8 % — ABNORMAL HIGH (ref 11.5–15.5)
WBC: 13 10*3/uL — ABNORMAL HIGH (ref 4.0–10.5)
nRBC: 0 % (ref 0.0–0.2)

## 2024-03-01 LAB — BASIC METABOLIC PANEL WITH GFR
Anion gap: 9 (ref 5–15)
BUN: 25 mg/dL — ABNORMAL HIGH (ref 8–23)
CO2: 23 mmol/L (ref 22–32)
Calcium: 8.1 mg/dL — ABNORMAL LOW (ref 8.9–10.3)
Chloride: 104 mmol/L (ref 98–111)
Creatinine, Ser: 1.48 mg/dL — ABNORMAL HIGH (ref 0.61–1.24)
GFR, Estimated: 49 mL/min — ABNORMAL LOW (ref >60)
Glucose, Bld: 107 mg/dL — ABNORMAL HIGH (ref 70–99)
Potassium: 4 mmol/L (ref 3.5–5.1)
Sodium: 136 mmol/L (ref 135–145)

## 2024-03-01 LAB — TROPONIN I (HIGH SENSITIVITY)
Troponin I (High Sensitivity): 20 ng/L — ABNORMAL HIGH (ref <18)
Troponin I (High Sensitivity): 25 ng/L — ABNORMAL HIGH (ref <18)

## 2024-03-01 MED ORDER — HYDROMORPHONE HCL 1 MG/ML IJ SOLN
1.0000 mg | Freq: Once | INTRAMUSCULAR | Status: AC
  Administered 2024-03-01: 1 mg via INTRAVENOUS
  Filled 2024-03-01: qty 1

## 2024-03-01 MED ORDER — IOHEXOL 350 MG/ML SOLN
65.0000 mL | Freq: Once | INTRAVENOUS | Status: AC | PRN
  Administered 2024-03-01: 65 mL via INTRAVENOUS

## 2024-03-01 MED ORDER — ALBUTEROL SULFATE (2.5 MG/3ML) 0.083% IN NEBU
5.0000 mg | INHALATION_SOLUTION | Freq: Once | RESPIRATORY_TRACT | Status: AC
  Administered 2024-03-01: 5 mg via RESPIRATORY_TRACT
  Filled 2024-03-01: qty 6

## 2024-03-01 NOTE — ED Notes (Signed)
 [facility]CMD called and notified

## 2024-03-01 NOTE — ED Triage Notes (Signed)
 Patient BIB [facility] from home c/o chest pain, which he describe as a pressure in the central part of chest, non radiating pain 9/10. He denies shortness of breath and N/V.Patient told EMS chest pain started at 9 am this morning. Patient uses CPAP at night at baseline.  Patient had left Hip surgery on Wednesday. EMS was told patient took oxycodone  10 mg at 9 am for hip pain and Robaxin  500 mg a 0940.  Patient was off Plavix  since Saturday and did not take one until this morning. Patient took Aspirin  325 this morning while his wife was on the phone with 911. Have history of Asthma and normally use Albuterol  inhaler at baseline. Ems gave patient a Duoneb  Albuterol  10mg  and Atrovent 0.5 at 10:30, report wheezing on the left lower lungs. Patient is A& O x4. Vital signs: BP 126 70, RR 230, HR 70 and blood sugar 110.

## 2024-03-01 NOTE — ED Notes (Signed)
 Patient transported to CT

## 2024-03-01 NOTE — ED Provider Notes (Signed)
 [facility] EMERGENCY DEPARTMENT AT [facility] Provider Note   CSN: [id number] Arrival date & time: 03/01/24  1024     Patient presents with: Chest Pain and Post-Opp left Hip surgery   Jared [name] is a 73 y.o. male.    Chest Pain Patient presents with chest pain.  Pressure in the mid chest.  No real shortness of breath but has had some wheezing.  History of asthma.  However did have hip surgery 2 days ago.  No fevers.  No coughing.  No swelling of the leg.  Does have pain in the right hip.    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 Authorizing Provider  albuterol  (PROVENTIL ) (2.5 MG/3ML) 0.083% nebulizer solution Take 3 mLs (2.5 mg total) by nebulization every 6 (six) hours as needed for wheezing or shortness of breath. 12/20/22   Kozlow, Rema Care, MD  albuterol  (VENTOLIN  HFA) 108 [phone] Base) MCG/ACT inhaler Inhale 2 puffs into the lungs every 4 (four) hours as needed for wheezing or shortness of breath. Can inhale two puffs every four to six hours as needed for cough, wheeze, shortness of breath, or chest tightness. 02/14/23   Brian Campanile, MD  amLODipine  (NORVASC ) 5 MG tablet TAKE 1 TABLET(5 MG) BY MOUTH DAILY 03/01/24   Avanell Leigh, MD  aspirin  EC 81 MG tablet Take 1 tablet (81 mg total) by mouth daily. Swallow whole. 10/21/23   Sanjuanita Cruz, NP  BREO ELLIPTA 200-25 MCG/ACT AEPB Inhale 1 puff into the lungs daily.    [provider]  carbidopa-levodopa (SINEMET IR) 25-100 MG tablet Take 1 tablet by mouth 3 (three) times daily.    [provider]  clopidogrel  (PLAVIX ) 75 MG tablet Take 1 tablet (75 mg total) by mouth daily with breakfast. 10/21/23   Johnie Nailer B, NP  Coenzyme Q10 (CO Q-10 PO) Take 1 capsule by mouth daily.    [provider]  DULoxetine  (CYMBALTA ) 60 MG capsule Take 60 mg by mouth every morning. 04/22/21   [provider]  EPINEPHrine  0.3 mg/0.3 mL IJ SOAJ injection Inject 0.3 mg into the muscle as needed for anaphylaxis. 06/12/23   [provider]  isosorbide  mononitrate (IMDUR ) 30 MG 24 hr tablet take 1 tablet by mouth once daily 06/21/17   Berry, Jonathan J, MD  levothyroxine  (SYNTHROID ) 75 MCG tablet Take 75 mcg by mouth daily before breakfast.    [provider]  metoprolol  succinate (TOPROL -XL) 25  MG 24 hr tablet TAKE 1 TABLET(25 MG) BY MOUTH DAILY 03/01/24   Avanell Leigh, MD  Multiple Vitamin (MULTIVITAMIN) tablet Take 1 tablet by mouth at bedtime.     [provider]  NUCALA  100 MG/ML SOSY Inject 1 mL into the skin every 30 (thirty) days. 02/14/23   [provider]  pantoprazole  (PROTONIX ) 40 MG tablet TAKE 1 TABLET BY MOUTH DAILY 12/04/23   Avanell Leigh, MD  REPATHA  SURECLICK 140 MG/ML SOAJ INJECT 1 PEN INTO THE SKIN EVERY 14 DAYS 11/23/23   Avanell Leigh, MD  sodium chloride  (OCEAN) 0.65 % SOLN nasal spray Place 1 spray into both nostrils as needed for congestion.    [provider]  Spacer/Aero-Holding Chambers (OPTICHAMBER DIAMOND ) MISC 1 each by Other route daily. Use as directed with inhaler.  12/20/22   Kozlow, Rema Care, MD  vitamin C  (ASCORBIC ACID ) 500 MG tablet Take 500 mg by mouth 2 (two) times daily.     [provider]  zinc  gluconate 50 MG tablet Take 50 mg by mouth daily.    [provider]    Allergies: Azithromycin , Contrast media [iodinated contrast media], Nsaids, Penicillins, Misc. sulfonamide containing compounds, Codeine, Penicillamine, Pholcodine, Statins, and Sulfonamide derivatives    Review of Systems  Cardiovascular:  Positive for chest pain.    Updated Vital Signs BP 125/66   Pulse 72   Temp 98 F (36.7 C) (Oral)   Resp 14   SpO2 96%   Physical Exam Vitals and nursing note reviewed.  Constitutional:      Appearance: He is well-developed.   Cardiovascular:     Rate and Rhythm: Normal rate and regular rhythm.  Pulmonary:     Breath sounds: Wheezing present.     Comments: Wheezes on the right side. Chest:     Chest wall: No tenderness.   Musculoskeletal:     Right lower leg: No edema.     Left lower leg: No edema.   Neurological:     Mental Status: He is alert.     (all labs ordered are listed, but only abnormal results are displayed) Labs Reviewed  BASIC METABOLIC PANEL WITH GFR - Abnormal; Notable for the following components:      Result Value   Glucose, Bld 107 (*)    BUN 25 (*)    Creatinine, Ser 1.48 (*)    Calcium  8.1 (*)    GFR, Estimated 49 (*)    All other components within normal limits  CBC - Abnormal; Notable for the following components:   WBC 13.0 (*)    Hemoglobin 11.3 (*)    HCT 36.3 (*)    RDW 15.8 (*)    All other components within normal limits  TROPONIN I (HIGH SENSITIVITY) - Abnormal; Notable for the following components:   Troponin I (High Sensitivity) 20 (*)    All other components within normal limits  TROPONIN I (HIGH SENSITIVITY) - Abnormal; Notable for the following components:   Troponin I (High Sensitivity) 25 (*)    All other components within normal limits    EKG: EKG  Interpretation Date/Time:  Friday March 01 2024 11:39:37 EDT Ventricular Rate:  69 PR Interval:  152 QRS Duration:  104 QT Interval:  400 QTC Calculation: 428 R Axis:   30  Text Interpretation: Normal sinus rhythm RSR' or QR pattern in V1 suggests right ventricular conduction delay Nonspecific ST and T wave abnormality Abnormal ECG No significant change since last tracing Confirmed  by Mozell Arias [phone]) on 03/01/2024 1:04:30 PM  Radiology: CT Angio Chest PE W and/or Wo Contrast Result Date: 03/01/2024 CLINICAL DATA:  Shortness of breath. EXAM: CT ANGIOGRAPHY CHEST WITH CONTRAST TECHNIQUE: Multidetector CT imaging of the chest was performed using the standard protocol during bolus administration of intravenous contrast. Multiplanar CT image reconstructions and MIPs were obtained to evaluate the vascular anatomy. RADIATION DOSE REDUCTION: This exam was performed according to the departmental dose-optimization program which includes automated exposure control, adjustment of the mA and/or kV according to patient size and/or use of iterative reconstruction technique. CONTRAST:  65mL OMNIPAQUE  IOHEXOL  350 MG/ML SOLN COMPARISON:  January 01, 2024.  October 29, 2021. FINDINGS: Cardiovascular: Satisfactory opacification of the pulmonary arteries to the segmental level. No evidence of pulmonary embolism. Normal heart size. No pericardial effusion. Mediastinum/Nodes: No enlarged mediastinal, hilar, or axillary lymph nodes. Thyroid  gland, trachea, and esophagus demonstrate no significant findings. Lungs/Pleura: No pneumothorax or pleural effusion is noted. Minimal right middle lobe subsegmental atelectasis is noted. Minimal left posterior basilar subsegmental atelectasis is noted. Two nodules are noted in left lung base. One measures 6 mm on image number 86 series 7. The other measures 5 mm on image number 82 of series 7. These are unchanged compared to prior exam. Upper Abdomen: Hepatic steatosis.  Musculoskeletal: No chest wall abnormality. No acute or significant osseous findings. Review of the MIP images confirms the above findings. IMPRESSION: No definite evidence of pulmonary embolus. Minimal right middle lobe subsegmental atelectasis as well as minimal left posterior basilar subsegmental atelectasis. Hepatic steatosis. Electronically Signed   By: Rosalene Colon M.D.   On: 03/01/2024 14:39   DG Chest Portable 1 View Result Date: 03/01/2024 CLINICAL DATA:  Shortness of breath. EXAM: PORTABLE CHEST 1 VIEW COMPARISON:  09/09/2023. FINDINGS: The heart size and mediastinal contours are within normal limits. Low lung volumes. Left basilar opacity could reflect atelectasis or infiltrate. Mild right basilar scarring/atelectasis. No sizable pleural effusion or pneumothorax. Lower cervical ACDF hardware again noted. No acute osseous abnormality. IMPRESSION: 1. Low lung volumes. Left basilar opacity could reflect atelectasis or infiltrate. 2. Mild right basilar scarring/atelectasis. Electronically Signed   By: Mannie Seek M.D.   On: 03/01/2024 12:05     Procedures   Medications Ordered in the ED  iohexol  (OMNIPAQUE ) 350 MG/ML injection 65 mL (65 mLs Intravenous Contrast Given 03/01/24 1332)  HYDROmorphone  (DILAUDID ) injection 1 mg (1 mg Intravenous Given 03/01/24 1554)  albuterol  (PROVENTIL ) (2.5 MG/3ML) 0.083% nebulizer solution 5 mg (5 mg Nebulization Given 03/01/24 1556)                                    Medical Decision Making Amount and/or Complexity of Data Reviewed Labs: ordered. Radiology: ordered.  Risk Prescription drug management.   Patient with chest pain and wheezes.  Recent surgery.  Differential diagnosis does include pulmonary embolism.  I think he is high risk will need to CT imaging for this.  Chest x-ray done and overall reassuring.  Will get basic blood work.  Reviewed note.  IV dye listed as allergy but only due to renal insufficiency and unilateral kidney.  Does  not need premedication.  CT scan reassuring.  No pulmonary embolism.  No pneumonia.  Does have continued chest pain.  Troponin is 20 and then 25.  Stable.  Doubt cardiac ischemia.  EKG reassuring.  I think with previous cardiac history is worth following  up with cardiology but do not think we need admission to the hospital at this time.      Final diagnoses:  Nonspecific chest pain    ED Discharge Orders          Ordered    Ambulatory referral to Cardiology       Comments: If you have not heard from the Cardiology office within the next 72 hours please call [phone].   03/01/24 1608               Mozell Arias, MD 03/01/24 [phone]

## 2024-03-04 DIAGNOSIS — J849 Interstitial pulmonary disease, unspecified: Secondary | ICD-10-CM | POA: Diagnosis not present

## 2024-03-04 DIAGNOSIS — J8283 Eosinophilic asthma: Secondary | ICD-10-CM | POA: Diagnosis not present

## 2024-03-04 DIAGNOSIS — J4551 Severe persistent asthma with (acute) exacerbation: Secondary | ICD-10-CM | POA: Diagnosis not present

## 2024-03-05 ENCOUNTER — Telehealth: Payer: Self-pay | Admitting: Cardiovascular Disease

## 2024-03-05 NOTE — Telephone Encounter (Signed)
 Spoke to patient Dr.Berry advised recent doppler of right leg done this month normal,widely patent.Advised to call back if pain does not improve.

## 2024-03-05 NOTE — Telephone Encounter (Signed)
 Patient is calling to say that he has a lot of pain when he walks in his right calve. Seen what the dr would recommend. Please advise

## 2024-03-05 NOTE — Telephone Encounter (Signed)
 Spoke to patient he stated he is having pain in right calf.He has had pain in right calf since he had procedure in Jan.He had a left hip replacement 1 week ago.Pain seems worse since he started walking with a walker.Stated no swelling.I will send message to [facility] for advice.

## 2024-03-18 DIAGNOSIS — L03116 Cellulitis of left lower limb: Secondary | ICD-10-CM | POA: Diagnosis not present

## 2024-04-02 DIAGNOSIS — Z5189 Encounter for other specified aftercare: Secondary | ICD-10-CM | POA: Diagnosis not present

## 2024-04-11 DIAGNOSIS — N401 Enlarged prostate with lower urinary tract symptoms: Secondary | ICD-10-CM | POA: Diagnosis not present

## 2024-04-11 DIAGNOSIS — R3912 Poor urinary stream: Secondary | ICD-10-CM | POA: Diagnosis not present

## 2024-04-11 DIAGNOSIS — N5201 Erectile dysfunction due to arterial insufficiency: Secondary | ICD-10-CM | POA: Diagnosis not present

## 2024-04-11 DIAGNOSIS — C61 Malignant neoplasm of prostate: Secondary | ICD-10-CM | POA: Diagnosis not present

## 2024-04-11 DIAGNOSIS — R351 Nocturia: Secondary | ICD-10-CM | POA: Diagnosis not present

## 2024-04-17 DIAGNOSIS — G4733 Obstructive sleep apnea (adult) (pediatric): Secondary | ICD-10-CM | POA: Diagnosis not present

## 2024-04-17 DIAGNOSIS — F5105 Insomnia due to other mental disorder: Secondary | ICD-10-CM | POA: Diagnosis not present

## 2024-04-17 DIAGNOSIS — F3181 Bipolar II disorder: Secondary | ICD-10-CM | POA: Diagnosis not present

## 2024-04-30 DIAGNOSIS — I129 Hypertensive chronic kidney disease with stage 1 through stage 4 chronic kidney disease, or unspecified chronic kidney disease: Secondary | ICD-10-CM | POA: Diagnosis not present

## 2024-04-30 DIAGNOSIS — E669 Obesity, unspecified: Secondary | ICD-10-CM | POA: Diagnosis not present

## 2024-04-30 DIAGNOSIS — E119 Type 2 diabetes mellitus without complications: Secondary | ICD-10-CM | POA: Diagnosis not present

## 2024-04-30 DIAGNOSIS — N1831 Chronic kidney disease, stage 3a: Secondary | ICD-10-CM | POA: Diagnosis not present

## 2024-04-30 DIAGNOSIS — G4730 Sleep apnea, unspecified: Secondary | ICD-10-CM | POA: Diagnosis not present

## 2024-05-02 DIAGNOSIS — E538 Deficiency of other specified B group vitamins: Secondary | ICD-10-CM | POA: Diagnosis not present

## 2024-05-02 DIAGNOSIS — N1831 Chronic kidney disease, stage 3a: Secondary | ICD-10-CM | POA: Diagnosis not present

## 2024-05-02 DIAGNOSIS — E785 Hyperlipidemia, unspecified: Secondary | ICD-10-CM | POA: Diagnosis not present

## 2024-05-02 DIAGNOSIS — E039 Hypothyroidism, unspecified: Secondary | ICD-10-CM | POA: Diagnosis not present

## 2024-05-02 DIAGNOSIS — I129 Hypertensive chronic kidney disease with stage 1 through stage 4 chronic kidney disease, or unspecified chronic kidney disease: Secondary | ICD-10-CM | POA: Diagnosis not present

## 2024-05-02 DIAGNOSIS — D509 Iron deficiency anemia, unspecified: Secondary | ICD-10-CM | POA: Diagnosis not present

## 2024-05-15 DIAGNOSIS — Z96652 Presence of left artificial knee joint: Secondary | ICD-10-CM | POA: Diagnosis not present

## 2024-05-15 DIAGNOSIS — Z96653 Presence of artificial knee joint, bilateral: Secondary | ICD-10-CM | POA: Diagnosis not present

## 2024-05-15 DIAGNOSIS — Z96651 Presence of right artificial knee joint: Secondary | ICD-10-CM | POA: Diagnosis not present

## 2024-05-17 ENCOUNTER — Telehealth: Payer: Self-pay | Admitting: Cardiovascular Disease

## 2024-05-17 NOTE — Telephone Encounter (Signed)
 Spoke with pt who is reporting he starting experiencing episodes of dizziness 2 to 3 weeks ago.  Reports having to hold onto things to prevent falls but has also fallen several (3) times.  Reports he recently passed out to 20 to 30 seconds [place] the beach, walking in [place] parking lot.  His friend helped him back to the hotel but he did not seek medication attention. Dizziness occurs mostly related to position changes, when if goes from laying to sitting and from sitting to standing.  He has not been checking his BP or HR.  This does not occur all the time per his report.  Taking Metoprolol  25 mg a day and Amlodipine  5 mg daily.  Also takes Carbidopa-levodopa for Parkinson's.  Advised to keep a check on his heart rate and blood pressure, wear knee high compression stockings, increase fluid intake and use extreme caution with position changes.  Advised to not drive until cleared  to do so.  This information to be sent to Dr Court for review but pt also advised to contact MD who RXes Carbidopa-levodopa for further evaluation.  Pt states understanding.

## 2024-05-17 NOTE — Telephone Encounter (Signed)
 Left message to call back.

## 2024-05-17 NOTE — Telephone Encounter (Signed)
 Pt c/o Syncope: STAT if syncope occurred within 24 hours and pt complains of lightheadedness  Did you pass out today? No   When is the last time you passed out? Yesterday    Has this occurred multiple times? Yes   Did you have any symptoms prior to passing out? lightheaded and very dizzy   5. Did you fall? If so, are you on a blood thinner? Yes and Yes

## 2024-05-21 DIAGNOSIS — M25562 Pain in left knee: Secondary | ICD-10-CM | POA: Diagnosis not present

## 2024-05-23 DIAGNOSIS — M25562 Pain in left knee: Secondary | ICD-10-CM | POA: Diagnosis not present

## 2024-05-27 DIAGNOSIS — M25562 Pain in left knee: Secondary | ICD-10-CM | POA: Diagnosis not present

## 2024-05-29 DIAGNOSIS — R82998 Other abnormal findings in urine: Secondary | ICD-10-CM | POA: Diagnosis not present

## 2024-05-30 DIAGNOSIS — M25562 Pain in left knee: Secondary | ICD-10-CM | POA: Diagnosis not present

## 2024-05-31 ENCOUNTER — Telehealth: Payer: Self-pay | Admitting: Cardiovascular Disease

## 2024-05-31 NOTE — Telephone Encounter (Signed)
 Spoke to patient he stated he has been having episodes of light headed and dizziness for the past 3 weeks.He has not blacked out.Appointment scheduled with Lum Louis NP 9/18 at 2:45 pm.Advised to monitor B/P daily and bring readings to appointment.

## 2024-05-31 NOTE — Telephone Encounter (Signed)
 STAT if patient feels like he/she is going to faint   Are you dizzy, lightheaded, or faint now? No   Have you passed out? No IF YES MOVE TO .SYNCOPECVD  Do you have any other symptoms? Fatigue   Have you checked your HR and BP (record if available)?  120's/60's hr 70's

## 2024-06-03 ENCOUNTER — Encounter: Payer: Self-pay | Admitting: *Deleted

## 2024-06-04 ENCOUNTER — Encounter: Payer: Self-pay | Admitting: Gastroenterology

## 2024-06-04 DIAGNOSIS — M25562 Pain in left knee: Secondary | ICD-10-CM | POA: Diagnosis not present

## 2024-06-05 ENCOUNTER — Emergency Department (HOSPITAL_BASED_OUTPATIENT_CLINIC_OR_DEPARTMENT_OTHER)
Admission: EM | Admit: 2024-06-05 | Discharge: 2024-06-06 | Disposition: A | Discharge status: Home / Self Care | Attending: Emergency Medicine | Admitting: Emergency Medicine

## 2024-06-05 ENCOUNTER — Other Ambulatory Visit: Payer: Self-pay

## 2024-06-05 ENCOUNTER — Emergency Department (HOSPITAL_BASED_OUTPATIENT_CLINIC_OR_DEPARTMENT_OTHER)

## 2024-06-05 ENCOUNTER — Emergency Department (HOSPITAL_BASED_OUTPATIENT_CLINIC_OR_DEPARTMENT_OTHER): Admitting: Radiology

## 2024-06-05 ENCOUNTER — Encounter: Payer: Self-pay | Admitting: *Deleted

## 2024-06-05 DIAGNOSIS — Z7901 Long term (current) use of anticoagulants: Secondary | ICD-10-CM | POA: Insufficient documentation

## 2024-06-05 DIAGNOSIS — I2510 Atherosclerotic heart disease of native coronary artery without angina pectoris: Secondary | ICD-10-CM | POA: Insufficient documentation

## 2024-06-05 DIAGNOSIS — R918 Other nonspecific abnormal finding of lung field: Secondary | ICD-10-CM | POA: Diagnosis not present

## 2024-06-05 DIAGNOSIS — J45909 Unspecified asthma, uncomplicated: Secondary | ICD-10-CM | POA: Diagnosis not present

## 2024-06-05 DIAGNOSIS — R0602 Shortness of breath: Secondary | ICD-10-CM | POA: Diagnosis not present

## 2024-06-05 DIAGNOSIS — J181 Lobar pneumonia, unspecified organism: Secondary | ICD-10-CM | POA: Insufficient documentation

## 2024-06-05 DIAGNOSIS — J984 Other disorders of lung: Secondary | ICD-10-CM | POA: Diagnosis not present

## 2024-06-05 DIAGNOSIS — J22 Unspecified acute lower respiratory infection: Secondary | ICD-10-CM | POA: Diagnosis not present

## 2024-06-05 DIAGNOSIS — Z7982 Long term (current) use of aspirin: Secondary | ICD-10-CM | POA: Diagnosis not present

## 2024-06-05 DIAGNOSIS — J189 Pneumonia, unspecified organism: Secondary | ICD-10-CM | POA: Diagnosis not present

## 2024-06-05 DIAGNOSIS — Z981 Arthrodesis status: Secondary | ICD-10-CM | POA: Diagnosis not present

## 2024-06-05 DIAGNOSIS — D72829 Elevated white blood cell count, unspecified: Secondary | ICD-10-CM | POA: Diagnosis not present

## 2024-06-05 DIAGNOSIS — J398 Other specified diseases of upper respiratory tract: Secondary | ICD-10-CM | POA: Diagnosis not present

## 2024-06-05 DIAGNOSIS — J9809 Other diseases of bronchus, not elsewhere classified: Secondary | ICD-10-CM | POA: Diagnosis not present

## 2024-06-05 DIAGNOSIS — R42 Dizziness and giddiness: Secondary | ICD-10-CM | POA: Diagnosis not present

## 2024-06-05 DIAGNOSIS — R0989 Other specified symptoms and signs involving the circulatory and respiratory systems: Secondary | ICD-10-CM | POA: Diagnosis not present

## 2024-06-05 LAB — CBC
HCT: 37.2 % — ABNORMAL LOW (ref 39.0–52.0)
Hemoglobin: 11.9 g/dL — ABNORMAL LOW (ref 13.0–17.0)
MCH: 26.2 pg (ref 26.0–34.0)
MCHC: 32 g/dL (ref 30.0–36.0)
MCV: 81.9 fL (ref 80.0–100.0)
Platelets: 311 K/uL (ref 150–400)
RBC: 4.54 MIL/uL (ref 4.22–5.81)
RDW: 16.1 % — ABNORMAL HIGH (ref 11.5–15.5)
WBC: 11.1 K/uL — ABNORMAL HIGH (ref 4.0–10.5)
nRBC: 0 % (ref 0.0–0.2)

## 2024-06-05 LAB — BASIC METABOLIC PANEL WITH GFR
Anion gap: 15 (ref 5–15)
BUN: 24 mg/dL — ABNORMAL HIGH (ref 8–23)
CO2: 19 mmol/L — ABNORMAL LOW (ref 22–32)
Calcium: 9.6 mg/dL (ref 8.9–10.3)
Chloride: 103 mmol/L (ref 98–111)
Creatinine, Ser: 1.41 mg/dL — ABNORMAL HIGH (ref 0.61–1.24)
GFR, Estimated: 52 mL/min — ABNORMAL LOW (ref >60)
Glucose, Bld: 112 mg/dL — ABNORMAL HIGH (ref 70–99)
Potassium: 4.3 mmol/L (ref 3.5–5.1)
Sodium: 137 mmol/L (ref 135–145)

## 2024-06-05 LAB — TROPONIN T, HIGH SENSITIVITY: Troponin T High Sensitivity: 18 ng/L (ref 0–19)

## 2024-06-05 MED ORDER — IOHEXOL 350 MG/ML SOLN
75.0000 mL | Freq: Once | INTRAVENOUS | Status: AC | PRN
  Administered 2024-06-05: 75 mL via INTRAVENOUS

## 2024-06-05 MED ORDER — IPRATROPIUM-ALBUTEROL 0.5-2.5 (3) MG/3ML IN SOLN
3.0000 mL | RESPIRATORY_TRACT | Status: AC
  Administered 2024-06-05: 3 mL via RESPIRATORY_TRACT
  Filled 2024-06-05: qty 3

## 2024-06-05 MED ORDER — DOXYCYCLINE HYCLATE 100 MG PO TABS
100.0000 mg | ORAL_TABLET | Freq: Once | ORAL | Status: AC
Start: 2024-06-06 — End: 2024-06-06
  Administered 2024-06-06: 100 mg via ORAL
  Filled 2024-06-05: qty 1

## 2024-06-05 MED ORDER — DOXYCYCLINE HYCLATE 100 MG PO CAPS
100.0000 mg | ORAL_CAPSULE | Freq: Two times a day (BID) | ORAL | 0 refills | Status: DC
Start: 2024-06-05 — End: 2024-07-15

## 2024-06-05 NOTE — ED Triage Notes (Signed)
 Pt POV reporting SOB and dizziness, seen by UC, chest xray showed pneumonia, advised to come to ED for abx.

## 2024-06-05 NOTE — ED Provider Notes (Signed)
 [facility] EMERGENCY DEPARTMENT AT [facility] Provider Note   CSN: [id number] Arrival date & time: 06/05/24  1753     Patient presents with: Shortness of Breath   Jared [name] is a 73 y.o. male.    Shortness of Breath  Patient with past medical history significant for STEMI, reflux, HLD, asthma, snoring, CAD  Patient presents emergency room today with chief complaint of shortness of breath has been coughing frequently and came to the emergency room for evaluation was seen recently in urgent care and given steroid IM for wheezing.  He is not hypoxic he denies any hemoptysis no syncope or near syncope.  No nausea or vomiting or chest pain or abdominal pain.     Prior to Admission medications   Medication Sig Start Date End Date Taking? Authorizing Provider  doxycycline  (VIBRAMYCIN ) 100 MG capsule Take 1 capsule (100 mg total) by mouth 2 (two) times daily. 06/05/24  Yes Kendre Jacinto S, PA  albuterol  (PROVENTIL ) (2.5 MG/3ML) 0.083% nebulizer solution Take 3 mLs (2.5 mg total) by nebulization every 6 (six) hours as needed for wheezing or shortness of breath. 12/20/22   Kozlow, Camellia JINNY, MD  albuterol  (VENTOLIN  HFA) 108 (90 Base) MCG/ACT inhaler Inhale 2 puffs into the lungs every 4 (four) hours as needed for wheezing or shortness of breath. Can inhale two puffs every four to six hours as needed for cough, wheeze, shortness of breath, or chest tightness. 02/14/23   Jeneal Danita Macintosh, MD  aspirin  EC 81 MG tablet Take 1 tablet (81 mg total) by mouth daily. Swallow whole. 10/21/23   Henry Manuelita NOVAK, NP  BREO ELLIPTA 200-25 MCG/ACT AEPB Inhale 1 puff into the lungs daily.    [provider]  carbidopa-levodopa (SINEMET IR) 25-100 MG tablet Take 1 tablet by mouth 3 (three) times daily.    [provider]  clopidogrel  (PLAVIX ) 75 MG tablet Take 1 tablet (75 mg total) by mouth daily with breakfast. 10/21/23   Henry Manuelita B, NP  Coenzyme Q10 (CO Q-10 PO)  Take 1 capsule by mouth daily.    [provider]  DULoxetine  (CYMBALTA ) 60 MG capsule Take 60 mg by mouth every morning. 04/22/21   [provider]  EPINEPHrine  0.3 mg/0.3 mL IJ SOAJ injection Inject 0.3 mg into the muscle as needed for anaphylaxis. 06/12/23   [provider]  isosorbide  mononitrate (IMDUR ) 30 MG 24 hr tablet take 1 tablet by mouth once daily 06/21/17   Berry, Jonathan J, MD  levothyroxine  (SYNTHROID ) 75 MCG tablet Take 75 mcg by mouth daily before breakfast.    [provider]  metoprolol  succinate (TOPROL -XL) 25 MG 24 hr tablet TAKE 1 TABLET(25 MG) BY MOUTH DAILY 03/01/24   Berry, Jonathan J, MD  Multiple Vitamin (MULTIVITAMIN) tablet Take 1 tablet by mouth at bedtime.     [provider]  NUCALA  100 MG/ML SOSY Inject 1 mL into the skin every 30 (thirty) days. 02/14/23   [provider]  pantoprazole  (PROTONIX ) 40 MG tablet TAKE 1 TABLET BY MOUTH DAILY 12/04/23   Court Dorn JINNY, MD  REPATHA  SURECLICK 140 MG/ML SOAJ INJECT 1 PEN INTO THE SKIN EVERY 14 DAYS 11/23/23   Court Dorn JINNY, MD  sodium chloride  (OCEAN) 0.65 % SOLN nasal spray Place 1 spray into both nostrils as needed for congestion.    [provider]  Spacer/Aero-Holding Chambers (OPTICHAMBER DIAMOND ) MISC 1 each by Other route daily. Use as directed with inhaler. 12/20/22   Kozlow, Camellia  J, MD  vitamin C  (ASCORBIC ACID ) 500 MG tablet Take 500 mg by mouth 2 (two) times daily.     [provider]  zinc  gluconate 50 MG tablet Take 50 mg by mouth daily.    [provider]    Allergies: Azithromycin , Nsaids, Penicillins, Misc. sulfonamide containing compounds, Codeine, Penicillamine, Pholcodine, Statins, and Sulfonamide derivatives    Review of Systems  Respiratory:  Positive for shortness of breath.     Updated Vital Signs BP (!) 149/68   Pulse 88   Temp 98.4 F (36.9 C)   Resp (!) 21   Ht 5' 7 (1.702 m)   Wt 91.6 kg   SpO2 97%   BMI  31.64 kg/m   Physical Exam Vitals and nursing note reviewed.  Constitutional:      General: He is not in acute distress. HENT:     Head: Normocephalic and atraumatic.     Nose: Nose normal.  Eyes:     General: No scleral icterus. Cardiovascular:     Rate and Rhythm: Normal rate and regular rhythm.     Pulses: Normal pulses.     Heart sounds: Normal heart sounds.  Pulmonary:     Effort: Pulmonary effort is normal. No respiratory distress.     Breath sounds: Wheezing present.     Comments: Good lung sounds throughout, some wheezing auscultated on expiration.  Speaking full sentences Abdominal:     Palpations: Abdomen is soft.     Tenderness: There is no abdominal tenderness.  Musculoskeletal:     Cervical back: Normal range of motion.     Right lower leg: No edema.     Left lower leg: No edema.  Skin:    General: Skin is warm and dry.     Capillary Refill: Capillary refill takes less than 2 seconds.  Neurological:     Mental Status: He is alert. Mental status is at baseline.  Psychiatric:        Mood and Affect: Mood normal.        Behavior: Behavior normal.     (all labs ordered are listed, but only abnormal results are displayed) Labs Reviewed  BASIC METABOLIC PANEL WITH GFR - Abnormal; Notable for the following components:      Result Value   CO2 19 (*)    Glucose, Bld 112 (*)    BUN 24 (*)    Creatinine, Ser 1.41 (*)    GFR, Estimated 52 (*)    All other components within normal limits  CBC - Abnormal; Notable for the following components:   WBC 11.1 (*)    Hemoglobin 11.9 (*)    HCT 37.2 (*)    RDW 16.1 (*)    All other components within normal limits  TROPONIN T, HIGH SENSITIVITY    EKG: EKG Interpretation Date/Time:  Wednesday June 05 2024 18:14:55 EDT Ventricular Rate:  74 PR Interval:  158 QRS Duration:  114 QT Interval:  398 QTC Calculation: 441 R Axis:   30  Text Interpretation: Sinus rhythm with occasional Premature ventricular  complexes Incomplete right bundle branch block T wave abnormality, consider anterior ischemia Abnormal ECG When compared with ECG of 01-Mar-2024 11:39, Premature ventricular complexes are now Present when compared top rior, new PVC No STEMI Confirmed by Ginger Barefoot ([id number]) on 06/05/2024 6:45:31 PM  Radiology: No results found.    Procedures   Medications Ordered in the ED  ipratropium-albuterol  (DUONEB) 0.5-2.5 (3) MG/3ML nebulizer solution 3 mL (3 mLs Nebulization  Given 06/05/24 1943)  ipratropium-albuterol  (DUONEB) 0.5-2.5 (3) MG/3ML nebulizer solution 3 mL (3 mLs Nebulization Given 06/05/24 1938)  iohexol  (OMNIPAQUE ) 350 MG/ML injection 75 mL (75 mLs Intravenous Contrast Given 06/05/24 2202)  doxycycline  (VIBRA -TABS) tablet 100 mg (100 mg Oral Given 06/06/24 0038)                                    Medical Decision Making Amount and/or Complexity of Data Reviewed Labs: ordered. Radiology: ordered.  Risk Prescription drug management.   This patient presents to the ED for concern of SOB, this involves a number of treatment options, and is a complaint that carries with it a moderate to high risk of complications and morbidity. A differential diagnosis was considered for the patient's symptoms which is discussed below:   The causes for shortness of breath include but are not limited to Cardiac (AHF, pericardial effusion and tamponade, arrhythmias, ischemia, etc) Respiratory (COPD, asthma, pneumonia, pneumothorax, primary pulmonary hypertension, PE/VQ mismatch) Hematological (anemia) Neuromuscular (ALS, Guillain-Barr, etc)    Co morbidities: Discussed in HPI   Brief History:   Patient with past medical history significant for STEMI, reflux, HLD, asthma, snoring, CAD  Patient presents emergency room today with chief complaint of shortness of breath has been coughing frequently and came to the emergency room for evaluation was seen recently in urgent care and given steroid IM  for wheezing.  He is not hypoxic he denies any hemoptysis no syncope or near syncope.  No nausea or vomiting or chest pain or abdominal pain.    EMR reviewed including pt PMHx, past surgical history and past visits to ER.   See HPI for more details   Lab Tests:   I ordered and independently interpreted labs. Labs notable for Trope normal, CBC with mild leukocytosis BMP with kidney however baseline.  Imaging Studies:  Abnormal findings. I personally reviewed all imaging studies. Imaging notable for  CT PE study shows pneumonia.  No other acute abnormal findings, negative for PE  Cardiac Monitoring:  The patient was maintained on a cardiac monitor.  I personally viewed and interpreted the cardiac monitored which showed an underlying rhythm of: NSR EKG non-ischemic   Medicines ordered:  I ordered medication including doxycycline , DuoNeb, DuoNeb for shortness of breath, pneumonia Reevaluation of the patient after these medicines showed that the patient improved I have reviewed the patients home medicines and have made adjustments as needed   Critical Interventions:     Consults/Attending Physician      Reevaluation:  After the interventions noted above I re-evaluated patient and found that they have :improved   Social Determinants of Health:      Problem List / ED Course:  Patient with some wheezing and pneumonia PE ruled out and he is feeling much improved.  Offered admission however patient would like to be discharged home.  Given first dose of doxycycline  here will follow-up with primary care.   Dispostion:  After consideration of the diagnostic results and the patients response to treatment, I feel that the patent would benefit from discharge home.  I offered the patient admission to the hospital however he states he would prefer to be discharged home at this time.  I think this is reasonable and given his preference will discharge home with oral  antibiotics.  Final diagnoses:  Pneumonia of right middle lobe due to infectious organism    ED Discharge Orders  Ordered    doxycycline  (VIBRAMYCIN ) 100 MG capsule  2 times daily        06/05/24 2345               Neldon Inoue [place], [place] 06/12/24 1015    Tegeler, Lonni PARAS, MD 06/12/24 1231

## 2024-06-06 ENCOUNTER — Ambulatory Visit: Attending: Emergency Medicine | Admitting: Emergency Medicine

## 2024-06-06 ENCOUNTER — Encounter: Payer: Self-pay | Admitting: Emergency Medicine

## 2024-06-06 VITALS — HR 92 | Ht 67.0 in | Wt 214.4 lb

## 2024-06-06 DIAGNOSIS — I25118 Atherosclerotic heart disease of native coronary artery with other forms of angina pectoris: Secondary | ICD-10-CM | POA: Diagnosis not present

## 2024-06-06 DIAGNOSIS — E785 Hyperlipidemia, unspecified: Secondary | ICD-10-CM

## 2024-06-06 DIAGNOSIS — I951 Orthostatic hypotension: Secondary | ICD-10-CM

## 2024-06-06 DIAGNOSIS — I739 Peripheral vascular disease, unspecified: Secondary | ICD-10-CM | POA: Diagnosis not present

## 2024-06-06 DIAGNOSIS — R0602 Shortness of breath: Secondary | ICD-10-CM

## 2024-06-06 DIAGNOSIS — R42 Dizziness and giddiness: Secondary | ICD-10-CM | POA: Diagnosis not present

## 2024-06-06 DIAGNOSIS — I10 Essential (primary) hypertension: Secondary | ICD-10-CM | POA: Diagnosis not present

## 2024-06-06 DIAGNOSIS — I779 Disorder of arteries and arterioles, unspecified: Secondary | ICD-10-CM

## 2024-06-06 DIAGNOSIS — I472 Ventricular tachycardia, unspecified: Secondary | ICD-10-CM

## 2024-06-06 NOTE — Discharge Instructions (Addendum)
 Take the antibiotics as prescribed for the entire course even if your symptoms improve.  Take all of your prescribed occasions as prescribed.  Follow-up with your primary care doctor when you return from your trip.

## 2024-06-06 NOTE — Progress Notes (Signed)
 Cardiology Office Note:    Date:  06/06/2024  ID:  Jared [name], DOB [birth year], MRN [id number] PCP: Jared Ade, MD  [facility] Providers Cardiologist:  Dorn Lesches, MD       Patient Profile:       Chief Complaint: Acute visit for lightheadedness and dizziness History of Present Illness:  Jared [name] is a 73 y.o. male with visit-pertinent history of coronary artery disease, hypertension, hyperlipidemia  He has history of coronary artery disease with stent to his dominant RCA placed in 2000.  He had mild left main disease with last cardiac cath in 2011 related widely patent stent in the RCA and 30% left main stenosis which was actually improved.  Normal LV function.  Most recent echocardiogram 03/2023 showed LVEF 60 to 65%, no RWMA, grade 1 DD, RV function and size normal, trivial MR, mild aortic valve regurgitation.  He had lower extremity arterial Doppler studies performed 07/03/2023 revealing a right ABI of 0.87 and a left of 1.15. He did have a high-frequency signal in his right posterior tibial artery. He tried cilostazol  for 3 months but it did not improve his symptoms. He had aortobifemoral 08/30/2023 revealing three-vessel runoff bilaterally. They did not mention the posterior tibial lesion.  On 10/19/2023 he underwent successful right TPT trunk balloon angioplasty followed by [facility] for lifestyle-limiting claudication. His ABI improved to normal. He did have a small pseudoaneurysm postprocedure which resolved spontaneously and now has some resolving hematoma. His claudication improved and he was on DAPT.  He was last seen in clinic on 02/26/2024 by Dr. Lesches for preoperative clearance for left total hip.  He was asymptomatic and cleared without the need for stress test.   Discussed the use of AI scribe software for clinical note transcription with the patient, who gave verbal consent to proceed.  History of Present Illness Jared [name] is a 73 year old male  who presents with lightheadedness and dizziness.  He experiences lightheadedness and dizziness for over a month, particularly when sitting up, standing, or bending over. These symptoms are absent when sitting but become prominent upon standing or walking. Similar symptoms occurred about a year ago, linked to blood pressure medication.  He experiences fatigue, lack of energy, and shortness of breath upon exertion, such as walking up an incline, for over a month. No chest pain is present.  No leg swelling or fluid retention is noted.  He was diagnosed with pneumonia yesterday.  His medical history includes hypertension, managed with amlodipine  at night and metoprolol . He switched from Mounjaro to Ozempic for weight management about a month ago.  He also had a hip replacement about ten to eleven weeks ago and is currently undergoing physical therapy. His stride has changed, with shorter steps observed by his wife and physical therapist. No episodes of syncope or presyncope.  Review of systems:  Please see the history of present illness. All other systems are reviewed and otherwise negative.      Studies Reviewed:    EKG Interpretation Date/Time:  Thursday June 06 2024 15:19:06 EDT Ventricular Rate:  92 PR Interval:  150 QRS Duration:  106 QT Interval:  348 QTC Calculation: 430 R Axis:   62  Text Interpretation: Normal sinus rhythm Incomplete right bundle branch block Nonspecific ST and T wave abnormality When compared with ECG of 05-Jun-2024 18:14, Premature ventricular complexes are no longer Present Confirmed by Rana Dixon [phone]) on 06/06/2024 9:55:40 PM    Peripheral vascular catheterization 10/19/2023 Angiographic Data:  1: Abdominal aorta-widely patent 2: Left lower extremity-normal three-vessel runoff 3: Right lower extremity-90% right tibioperoneal trunk stenosis with three-vessel runoff   IMPRESSION: [name] has a high-grade distal right TPT stenosis  responsible for his claudication and consistent with his Doppler studies.  Will proceed with shockwave balloon angioplasty followed by [facility].  Echocardiogram 04/13/2023 1. Left ventricular ejection fraction, by estimation, is 60 to 65%. The  left ventricle has normal function. The left ventricle has no regional  wall motion abnormalities. Left ventricular diastolic parameters are  consistent with Grade I diastolic  dysfunction (impaired relaxation).   2. Right ventricular systolic function is normal. The right ventricular  size is normal. There is normal pulmonary artery systolic pressure.   3. The mitral valve is normal in structure. Trivial mitral valve  regurgitation. No evidence of mitral stenosis.   4. The aortic valve is tricuspid. Aortic valve regurgitation is mild. No  aortic stenosis is present.   5. The inferior vena cava is normal in size with greater than 50%  respiratory variability, suggesting right atrial pressure of 3 mmHg.   Carotid duplex 05/26/2023 Right Carotid: Velocities in the right ICA are consistent with a 40-59%                 stenosis.   Left Carotid: Velocities in the left ICA are consistent with a 1-39%  stenosis.               The ECA appears >50% stenosed.   Vertebrals:  Bilateral vertebral arteries demonstrate antegrade flow.  Subclavians: Normal flow hemodynamics were seen in bilateral subclavian               arteries.   *See table(s) above for measurements and observations.  Suggest follow up study in 12 months.   ZIO 02/17/2023 Patch Wear Time:  13 days and 11 hours (2024-06-03T15:05:56-0400 to 2024-06-18T10:35:49-0400)   Monitor 1 Patient had a min HR of 59 bpm, max HR of 113 bpm, and avg HR of 79 bpm. Predominant underlying rhythm was Sinus Rhythm. 1 run of Supraventricular Tachycardia occurred lasting 4 beats with a max rate of 109 bpm (avg 107 bpm). Isolated SVEs were rare  (<1.0%), SVE Triplets were rare (<1.0%), and no SVE Couplets were  present. Isolated VEs were occasional (1.6%, 879), VE Couplets were rare (<1.0%, 19), and no VE Triplets were present. Ventricular Trigeminy was present.    Monitor 2 Patient had a min HR of 56 bpm, max HR of 190 bpm, and avg HR of 85 bpm. Predominant underlying rhythm was Sinus Rhythm. 2 Ventricular Tachycardia runs occurred, the run with the fastest interval lasting 19 beats with a max rate of 156 bpm (avg 141 bpm);  the run with the fastest interval was also the longest. 12 Supraventricular Tachycardia runs occurred, the run with the fastest interval lasting 37.2 secs with a max rate of 190 bpm (avg 125 bpm); the run with the fastest interval was also the longest.  Isolated SVEs were rare (<1.0%), SVE Couplets were rare (<1.0%), and SVE Triplets were rare (<1.0%). Isolated VEs were occasional (1.4%, [id number]), VE Couplets were rare (<1.0%, 392), and VE Triplets were rare (<1.0%, 6). Ventricular Bigeminy was present.  Inverted QRS complexes possibly due to inverted placement of device.   SR/SB/ST Freq PVCs (1.6% burden) Short runs of SVT and longer runs of NSVT ROV to discuss   Risk Assessment/Calculations:     HYPERTENSION CONTROL There were no vitals filed for this visit.  The patient's blood pressure is elevated above target today.  In order to address the patient's elevated BP:            Physical Exam:   VS:  Pulse 92   Ht 5' 7 (1.702 m)   Wt 214 lb 6.4 oz (97.3 kg)   SpO2 96%   BMI 33.58 kg/m    Wt Readings from Last 3 Encounters:  06/06/24 214 lb 6.4 oz (97.3 kg)  06/05/24 202 lb (91.6 kg)  02/26/24 208 lb 3.2 oz (94.4 kg)    GEN: Well nourished, well developed in no acute distress NECK: No JVD; No carotid bruits CARDIAC: RRR, no murmurs, rubs, gallops RESPIRATORY:  Clear to auscultation without rales, wheezing or rhonchi  ABDOMEN: Soft, non-tender, non-distended EXTREMITIES:  No edema; No acute deformity      Assessment and Plan:  Orthostatic  hypotension Hypertension Telmisartan previously discontinued due to orthostatic hypotension - He has reported lightheadedness and dizziness for over a month particularly when sitting up, standing, or bending over.  He has been without any syncope or presyncope - He was positive for orthostatic hypotension in clinic today.  Supine 141/79, sitting 139/75, standing 119/72 with at least a 20 point drop from sitting to standing - Plan to discontinue amlodipine  - Encouraged increasing hydration, lower extremity stockings, and increasing salt intake - Continue isosorbide  30 mg daily and metoprolol  XL 25 mg daily  Coronary artery disease S/p remote abdominal RCA stenting in 2000.  Last cardiac catheterization in 2011 revealing widely patent stent with 30% left main stenosis which was improved from prior - Today he is stable without anginal symptoms.  No indication for further ischemic evaluation - Continue clopidogrel  75 mg daily, aspirin  81 mg daily, isosorbide  30 mg daily, metoprolol  XL 25 mg daily, Repatha  140 mg q. 14 days  Hyperlipidemia, LDL goal <70 LDL 77 on 09/2023 Past intolerances to atorvastatin and rosuvastatin - Continue Repatha  - Plan for repeat fasting lipid panel at follow-up visit  Carotid stenosis Carotid duplex 05/2023 shows right ICA consistent with 40-59% stenosis in left ICA consistent with 1-39% stenosis - Will plan to repeat at follow-up visit - Continue aspirin  and Plavix   Peripheral arterial disease S/p successful right TPT trunk balloon angioplasty followed by [facility] for lifestyle-limiting claudication on 09/2023 - Most recent Dopplers 02/2024 reassuring - Managed by Dr. Court - He denies claudication  VT/SVT/syncope Cardiac event monitor 02/2023 showed 19 beat run of VT, 13 beat run of SVT.  Echocardiogram 7/24 without abnormalities Patient was asymptomatic during event.  Did have syncopal episode on 01/2023 - Quiescent.  Today patient is without syncope, presyncope, or  palpitations  Shortness of breath He notes progressive shortness of breath over the past month. CTA of the chest yesterday showed pneumonia and he was started on antibiotics - He is without anginal symptoms today, no chest pains, and EKG without acute ischemic changes - Continue with current antibiotic treatment.  Will reassess at follow-up visit      Dispo:  Return in about 6 weeks (around 07/18/2024).  Signed, Lum LITTIE Louis, NP

## 2024-06-06 NOTE — Patient Instructions (Signed)
 Medication Instructions:  NO CHANGES  Lab Work: BNP TO BE DONE TODAY.  Testing/Procedures: NONE  Follow-Up: At [facility], you and your health needs are our priority.  As part of our continuing mission to provide you with exceptional heart care, our providers are all part of one team.  This team includes your primary Cardiologist (physician) and Advanced Practice Providers or APPs (Physician Assistants and Nurse Practitioners) who all work together to provide you with the care you need, when you need it.  Your next appointment:   6 WEEKS  Provider:   Dorn Lesches, MD OR Lum Louis, NP

## 2024-06-07 DIAGNOSIS — C61 Malignant neoplasm of prostate: Secondary | ICD-10-CM | POA: Diagnosis not present

## 2024-06-07 DIAGNOSIS — J189 Pneumonia, unspecified organism: Secondary | ICD-10-CM | POA: Diagnosis not present

## 2024-06-07 DIAGNOSIS — E119 Type 2 diabetes mellitus without complications: Secondary | ICD-10-CM | POA: Diagnosis not present

## 2024-06-07 DIAGNOSIS — I129 Hypertensive chronic kidney disease with stage 1 through stage 4 chronic kidney disease, or unspecified chronic kidney disease: Secondary | ICD-10-CM | POA: Diagnosis not present

## 2024-06-07 DIAGNOSIS — N401 Enlarged prostate with lower urinary tract symptoms: Secondary | ICD-10-CM | POA: Diagnosis not present

## 2024-06-07 DIAGNOSIS — R3912 Poor urinary stream: Secondary | ICD-10-CM | POA: Diagnosis not present

## 2024-06-07 DIAGNOSIS — N1831 Chronic kidney disease, stage 3a: Secondary | ICD-10-CM | POA: Diagnosis not present

## 2024-06-07 LAB — BRAIN NATRIURETIC PEPTIDE: BNP: 44.8 pg/mL (ref 0.0–100.0)

## 2024-06-09 ENCOUNTER — Ambulatory Visit: Payer: Self-pay | Admitting: Emergency Medicine

## 2024-06-21 DIAGNOSIS — L820 Inflamed seborrheic keratosis: Secondary | ICD-10-CM | POA: Diagnosis not present

## 2024-06-21 DIAGNOSIS — Z85820 Personal history of malignant melanoma of skin: Secondary | ICD-10-CM | POA: Diagnosis not present

## 2024-06-21 DIAGNOSIS — D0472 Carcinoma in situ of skin of left lower limb, including hip: Secondary | ICD-10-CM | POA: Diagnosis not present

## 2024-06-21 DIAGNOSIS — C44622 Squamous cell carcinoma of skin of right upper limb, including shoulder: Secondary | ICD-10-CM | POA: Diagnosis not present

## 2024-06-21 DIAGNOSIS — D2261 Melanocytic nevi of right upper limb, including shoulder: Secondary | ICD-10-CM | POA: Diagnosis not present

## 2024-06-21 DIAGNOSIS — D045 Carcinoma in situ of skin of trunk: Secondary | ICD-10-CM | POA: Diagnosis not present

## 2024-06-21 DIAGNOSIS — L814 Other melanin hyperpigmentation: Secondary | ICD-10-CM | POA: Diagnosis not present

## 2024-06-21 DIAGNOSIS — Z85828 Personal history of other malignant neoplasm of skin: Secondary | ICD-10-CM | POA: Diagnosis not present

## 2024-06-21 DIAGNOSIS — C44629 Squamous cell carcinoma of skin of left upper limb, including shoulder: Secondary | ICD-10-CM | POA: Diagnosis not present

## 2024-06-21 DIAGNOSIS — D485 Neoplasm of uncertain behavior of skin: Secondary | ICD-10-CM | POA: Diagnosis not present

## 2024-06-21 DIAGNOSIS — C44612 Basal cell carcinoma of skin of right upper limb, including shoulder: Secondary | ICD-10-CM | POA: Diagnosis not present

## 2024-06-21 DIAGNOSIS — L080 Pyoderma: Secondary | ICD-10-CM | POA: Diagnosis not present

## 2024-06-21 DIAGNOSIS — L570 Actinic keratosis: Secondary | ICD-10-CM | POA: Diagnosis not present

## 2024-06-21 DIAGNOSIS — L821 Other seborrheic keratosis: Secondary | ICD-10-CM | POA: Diagnosis not present

## 2024-06-21 DIAGNOSIS — L578 Other skin changes due to chronic exposure to nonionizing radiation: Secondary | ICD-10-CM | POA: Diagnosis not present

## 2024-06-28 ENCOUNTER — Other Ambulatory Visit: Payer: Self-pay | Admitting: Registered Nurse

## 2024-06-28 ENCOUNTER — Ambulatory Visit
Admission: RE | Admit: 2024-06-28 | Discharge: 2024-06-28 | Disposition: A | Discharge status: Home / Self Care | Source: Ambulatory Visit | Attending: Registered Nurse | Admitting: Registered Nurse

## 2024-06-28 DIAGNOSIS — R309 Painful micturition, unspecified: Secondary | ICD-10-CM | POA: Diagnosis not present

## 2024-06-28 DIAGNOSIS — K5730 Diverticulosis of large intestine without perforation or abscess without bleeding: Secondary | ICD-10-CM | POA: Diagnosis not present

## 2024-06-28 DIAGNOSIS — R10A2 Flank pain, left side: Secondary | ICD-10-CM

## 2024-06-28 DIAGNOSIS — K5792 Diverticulitis of intestine, part unspecified, without perforation or abscess without bleeding: Secondary | ICD-10-CM | POA: Diagnosis not present

## 2024-06-28 DIAGNOSIS — R1012 Left upper quadrant pain: Secondary | ICD-10-CM | POA: Diagnosis not present

## 2024-06-28 DIAGNOSIS — N1831 Chronic kidney disease, stage 3a: Secondary | ICD-10-CM | POA: Diagnosis not present

## 2024-06-28 DIAGNOSIS — J189 Pneumonia, unspecified organism: Secondary | ICD-10-CM | POA: Diagnosis not present

## 2024-06-28 DIAGNOSIS — R109 Unspecified abdominal pain: Secondary | ICD-10-CM | POA: Diagnosis not present

## 2024-06-28 DIAGNOSIS — E119 Type 2 diabetes mellitus without complications: Secondary | ICD-10-CM | POA: Diagnosis not present

## 2024-06-28 DIAGNOSIS — I129 Hypertensive chronic kidney disease with stage 1 through stage 4 chronic kidney disease, or unspecified chronic kidney disease: Secondary | ICD-10-CM | POA: Diagnosis not present

## 2024-06-28 DIAGNOSIS — Z1389 Encounter for screening for other disorder: Secondary | ICD-10-CM | POA: Diagnosis not present

## 2024-07-02 ENCOUNTER — Ambulatory Visit: Attending: Pharmacist | Admitting: Pharmacist

## 2024-07-02 DIAGNOSIS — M1652 Unilateral post-traumatic osteoarthritis, left hip: Secondary | ICD-10-CM | POA: Insufficient documentation

## 2024-07-02 DIAGNOSIS — J9811 Atelectasis: Secondary | ICD-10-CM | POA: Insufficient documentation

## 2024-07-02 DIAGNOSIS — L301 Dyshidrosis [pompholyx]: Secondary | ICD-10-CM | POA: Insufficient documentation

## 2024-07-02 DIAGNOSIS — E119 Type 2 diabetes mellitus without complications: Secondary | ICD-10-CM | POA: Insufficient documentation

## 2024-07-02 DIAGNOSIS — C439 Malignant melanoma of skin, unspecified: Secondary | ICD-10-CM | POA: Insufficient documentation

## 2024-07-05 ENCOUNTER — Other Ambulatory Visit: Payer: Self-pay | Admitting: Cardiology

## 2024-07-08 ENCOUNTER — Emergency Department (HOSPITAL_BASED_OUTPATIENT_CLINIC_OR_DEPARTMENT_OTHER)
Admission: EM | Admit: 2024-07-08 | Discharge: 2024-07-08 | Disposition: A | Discharge status: Home / Self Care | Source: Ambulatory Visit | Attending: Emergency Medicine | Admitting: Emergency Medicine

## 2024-07-08 ENCOUNTER — Encounter (HOSPITAL_BASED_OUTPATIENT_CLINIC_OR_DEPARTMENT_OTHER): Payer: Self-pay | Admitting: Emergency Medicine

## 2024-07-08 ENCOUNTER — Other Ambulatory Visit: Payer: Self-pay

## 2024-07-08 ENCOUNTER — Emergency Department (HOSPITAL_BASED_OUTPATIENT_CLINIC_OR_DEPARTMENT_OTHER)

## 2024-07-08 ENCOUNTER — Telehealth: Payer: Self-pay | Admitting: Cardiovascular Disease

## 2024-07-08 DIAGNOSIS — J45909 Unspecified asthma, uncomplicated: Secondary | ICD-10-CM | POA: Insufficient documentation

## 2024-07-08 DIAGNOSIS — Z043 Encounter for examination and observation following other accident: Secondary | ICD-10-CM | POA: Diagnosis not present

## 2024-07-08 DIAGNOSIS — R55 Syncope and collapse: Secondary | ICD-10-CM | POA: Insufficient documentation

## 2024-07-08 DIAGNOSIS — W01198A Fall on same level from slipping, tripping and stumbling with subsequent striking against other object, initial encounter: Secondary | ICD-10-CM | POA: Diagnosis not present

## 2024-07-08 DIAGNOSIS — Z7982 Long term (current) use of aspirin: Secondary | ICD-10-CM | POA: Diagnosis not present

## 2024-07-08 DIAGNOSIS — D72829 Elevated white blood cell count, unspecified: Secondary | ICD-10-CM | POA: Insufficient documentation

## 2024-07-08 DIAGNOSIS — Z79899 Other long term (current) drug therapy: Secondary | ICD-10-CM | POA: Insufficient documentation

## 2024-07-08 DIAGNOSIS — I10 Essential (primary) hypertension: Secondary | ICD-10-CM | POA: Insufficient documentation

## 2024-07-08 DIAGNOSIS — Z8674 Personal history of sudden cardiac arrest: Secondary | ICD-10-CM | POA: Insufficient documentation

## 2024-07-08 LAB — COMPREHENSIVE METABOLIC PANEL WITH GFR
ALT: 19 U/L (ref 0–44)
AST: 18 U/L (ref 15–41)
Albumin: 4.4 g/dL (ref 3.5–5.0)
Alkaline Phosphatase: 89 U/L (ref 38–126)
Anion gap: 11 (ref 5–15)
BUN: 26 mg/dL — ABNORMAL HIGH (ref 8–23)
CO2: 24 mmol/L (ref 22–32)
Calcium: 9.5 mg/dL (ref 8.9–10.3)
Chloride: 101 mmol/L (ref 98–111)
Creatinine, Ser: 1.31 mg/dL — ABNORMAL HIGH (ref 0.61–1.24)
GFR, Estimated: 57 mL/min — ABNORMAL LOW (ref >60)
Glucose, Bld: 92 mg/dL (ref 70–99)
Potassium: 4.5 mmol/L (ref 3.5–5.1)
Sodium: 136 mmol/L (ref 135–145)
Total Bilirubin: 0.3 mg/dL (ref 0.0–1.2)
Total Protein: 8.1 g/dL (ref 6.5–8.1)

## 2024-07-08 LAB — MAGNESIUM: Magnesium: 2.3 mg/dL (ref 1.7–2.4)

## 2024-07-08 LAB — CBC WITH DIFFERENTIAL/PLATELET
Abs Immature Granulocytes: 0.06 K/uL (ref 0.00–0.07)
Basophils Absolute: 0.1 K/uL (ref 0.0–0.1)
Basophils Relative: 1 %
Eosinophils Absolute: 0.2 K/uL (ref 0.0–0.5)
Eosinophils Relative: 1 %
HCT: 44.5 % (ref 39.0–52.0)
Hemoglobin: 14.1 g/dL (ref 13.0–17.0)
Immature Granulocytes: 1 %
Lymphocytes Relative: 21 %
Lymphs Abs: 2.4 K/uL (ref 0.7–4.0)
MCH: 26 pg (ref 26.0–34.0)
MCHC: 31.7 g/dL (ref 30.0–36.0)
MCV: 82 fL (ref 80.0–100.0)
Monocytes Absolute: 1.2 K/uL — ABNORMAL HIGH (ref 0.1–1.0)
Monocytes Relative: 10 %
Neutro Abs: 8 K/uL — ABNORMAL HIGH (ref 1.7–7.7)
Neutrophils Relative %: 66 %
Platelets: 345 K/uL (ref 150–400)
RBC: 5.43 MIL/uL (ref 4.22–5.81)
RDW: 16.6 % — ABNORMAL HIGH (ref 11.5–15.5)
WBC: 11.9 K/uL — ABNORMAL HIGH (ref 4.0–10.5)
nRBC: 0 % (ref 0.0–0.2)

## 2024-07-08 LAB — TROPONIN T, HIGH SENSITIVITY
Troponin T High Sensitivity: 29 ng/L — ABNORMAL HIGH (ref 0–19)
Troponin T High Sensitivity: 28 ng/L — ABNORMAL HIGH (ref 0–19)

## 2024-07-08 NOTE — ED Triage Notes (Signed)
 Mechanical fall on 10/5. Hit head. Takes plavix . Denies LOC. States increased dizziness and lightheaded since fall.

## 2024-07-08 NOTE — Discharge Instructions (Addendum)
 It was a pleasure taking care of you today. You will need repeat heart monitor and need updated echo when following up. Call to schedule appointment. Return to ER with new or worsening symptoms

## 2024-07-08 NOTE — ED Provider Triage Note (Signed)
 Emergency Medicine Provider Triage Evaluation Note  JAMESON TORMEY , a 73 y.o. male  was evaluated in triage.  Pt complains of syncope and collapse 10/4. Walking to car got really dizzy passed out.   Yesterday got really dizzy and almost passed out. Called Cardiologist for evaluation and advised to go to ER.   Today has been getting dizzy without syncope.   On plavix  for collapsed artery in leg.   Review of Systems  Positive: Dizzy, syncope, fall, loss of balance  Negative: HA, vision loss, SHOB, CP  Physical Exam  BP (!) 155/73 (BP Location: Right Arm)   Pulse (!) 56   Temp 98.2 F (36.8 C)   Resp 18   SpO2 100%  Gen:   Awake, no distress   Resp:  Normal effort  MSK:   Moves extremities without difficulty Other:    Medical Decision Making  Medically screening exam initiated at 4:53 PM.  Appropriate orders placed.  [name] was informed that the remainder of the evaluation will be completed by another provider, this initial triage assessment does not replace that evaluation, and the importance of remaining in the ED until their evaluation is complete.     Myriam Fonda RAMAN, [place] 07/08/24 1704

## 2024-07-08 NOTE — ED Provider Notes (Signed)
 [facility] AT [facility] Provider Note   CSN: [id number] Arrival date & time: 07/08/24  1219     Patient presents with: Jared [name] is a 73 y.o. male patient with past medical history of heart attack, GERD, hyperlipidemia, hypertension, asthma is presenting to emergency room with complaint of fall.  Reports that approximately 16 days ago he was walking to his car became acutely very lightheaded and subsequently collapsed.  He reports that a bystander told him that his eyes were rolling in the back of his head.  He laid on the ground for a few minutes and then came back to.  He does not note any tongue biting or other incontinence with this.  He reports he did not come to the ER because he started feeling better.  Over the last 4 days he has had similar episodes where he has become very lightheaded at random times and felt like he is going to pass out.  This happened right prior to arrival and cardiology sent him here for further evaluation.  He does take Plavix . Reports three days of right shoulder pain.  He denies any chest pain shortness of breath or palpitations.    Fall       Prior to Admission medications   Medication Sig Start Date End Date Taking? Authorizing Provider  albuterol  (PROVENTIL ) (2.5 MG/3ML) 0.083% nebulizer solution Take 3 mLs (2.5 mg total) by nebulization every 6 (six) hours as needed for wheezing or shortness of breath. 12/20/22   Kozlow, Camellia JINNY, MD  albuterol  (VENTOLIN  HFA) 108 (90 Base) MCG/ACT inhaler Inhale 2 puffs into the lungs every 4 (four) hours as needed for wheezing or shortness of breath. Can inhale two puffs every four to six hours as needed for cough, wheeze, shortness of breath, or chest tightness. 02/14/23   Jeneal Danita Macintosh, MD  aspirin  EC 81 MG tablet Take 1 tablet (81 mg total) by mouth daily. Swallow whole. 10/21/23   Henry Manuelita NOVAK, NP  BREO ELLIPTA 200-25 MCG/ACT AEPB Inhale 1 puff into the  lungs daily.    [provider]  carbidopa-levodopa (SINEMET IR) 25-100 MG tablet Take 1 tablet by mouth 3 (three) times daily.    [provider]  clopidogrel  (PLAVIX ) 75 MG tablet Take 1 tablet (75 mg total) by mouth daily with breakfast. 10/21/23   Henry Manuelita B, NP  Coenzyme Q10 (CO Q-10 PO) Take 1 capsule by mouth daily.    [provider]  doxycycline  (VIBRAMYCIN ) 100 MG capsule Take 1 capsule (100 mg total) by mouth 2 (two) times daily. 06/05/24   [name], PA  DULoxetine  (CYMBALTA ) 60 MG capsule Take 60 mg by mouth every morning. 04/22/21   [provider]  EPINEPHrine  0.3 mg/0.3 mL IJ SOAJ injection Inject 0.3 mg into the muscle as needed for anaphylaxis. 06/12/23   [provider]  isosorbide  mononitrate (IMDUR ) 30 MG 24 hr tablet take 1 tablet by mouth once daily 06/21/17   Berry, Jonathan J, MD  levothyroxine  (SYNTHROID ) 75 MCG tablet Take 75 mcg by mouth daily before breakfast.    [provider]  metoprolol  succinate (TOPROL -XL) 25 MG 24 hr tablet TAKE 1 TABLET(25 MG) BY MOUTH DAILY 03/01/24   Berry, Jonathan J, MD  Multiple Vitamin (MULTIVITAMIN) tablet Take 1 tablet by mouth at bedtime.     [provider]  NUCALA  100 MG/ML SOSY Inject 1 mL into the skin every 30 (thirty) days. 02/14/23  [provider]  pantoprazole  (PROTONIX ) 40 MG tablet TAKE 1 TABLET BY MOUTH DAILY 12/04/23   Court Dorn PARAS, MD  REPATHA  SURECLICK 140 MG/ML SOAJ INJECT 1 PEN INTO THE SKIN EVERY 14 DAYS 11/23/23   Court Dorn PARAS, MD  sodium chloride  (OCEAN) 0.65 % SOLN nasal spray Place 1 spray into both nostrils as needed for congestion.    [provider]  Spacer/Aero-Holding Chambers (OPTICHAMBER DIAMOND ) MISC 1 each by Other route daily. Use as directed with inhaler. 12/20/22   Kozlow, Camellia PARAS, MD  vitamin C  (ASCORBIC ACID ) 500 MG tablet Take 500 mg by mouth 2 (two) times daily.     [provider]  zinc  gluconate 50  MG tablet Take 50 mg by mouth daily.    [provider]    Allergies: Azithromycin , Nsaids, Penicillins, Misc. sulfonamide containing compounds, Codeine, Penicillamine, Pholcodine, Statins, and Sulfonamide derivatives    Review of Systems  Neurological:  Positive for dizziness.    Updated Vital Signs BP (!) 172/89   Pulse 72   Temp 98.2 F (36.8 C)   Resp 19   SpO2 100%   Physical Exam Vitals and nursing note reviewed.  Constitutional:      General: He is not in acute distress.    Appearance: He is not toxic-appearing.  HENT:     Head: Normocephalic and atraumatic.  Eyes:     General: No scleral icterus.    Conjunctiva/sclera: Conjunctivae normal.  Cardiovascular:     Rate and Rhythm: Normal rate and regular rhythm.     Pulses: Normal pulses.     Heart sounds: Normal heart sounds.  Pulmonary:     Effort: Pulmonary effort is normal. No respiratory distress.     Breath sounds: Normal breath sounds.  Abdominal:     General: Abdomen is flat. Bowel sounds are normal.     Palpations: Abdomen is soft.     Tenderness: There is no abdominal tenderness.  Musculoskeletal:       Arms:     Right lower leg: No edema.     Left lower leg: No edema.  Skin:    General: Skin is warm and dry.     Findings: No lesion.  Neurological:     General: No focal deficit present.     Mental Status: He is alert and oriented to person, place, and time. Mental status is at baseline.     (all labs ordered are listed, but only abnormal results are displayed) Labs Reviewed  CBC WITH DIFFERENTIAL/PLATELET - Abnormal; Notable for the following components:      Result Value   WBC 11.9 (*)    RDW 16.6 (*)    Neutro Abs 8.0 (*)    Monocytes Absolute 1.2 (*)    All other components within normal limits  COMPREHENSIVE METABOLIC PANEL WITH GFR - Abnormal; Notable for the following components:   BUN 26 (*)    Creatinine, Ser 1.31 (*)    GFR, Estimated 57 (*)    All other components  within normal limits  TROPONIN T, HIGH SENSITIVITY - Abnormal; Notable for the following components:   Troponin T High Sensitivity 29 (*)    All other components within normal limits  MAGNESIUM   TROPONIN T, HIGH SENSITIVITY    EKG: None  Radiology: CT Head Wo Contrast Result Date: 07/08/2024 CLINICAL DATA:  Clemens.  Hit head.  Patient is on Plavix  EXAM: CT HEAD WITHOUT CONTRAST CT CERVICAL SPINE WITHOUT CONTRAST TECHNIQUE: Multidetector CT imaging  of the head and cervical spine was performed following the standard protocol without intravenous contrast. Multiplanar CT image reconstructions of the cervical spine were also generated. RADIATION DOSE REDUCTION: This exam was performed according to the departmental dose-optimization program which includes automated exposure control, adjustment of the mA and/or kV according to patient size and/or use of iterative reconstruction technique. COMPARISON:  Head CT 06/01/2021 FINDINGS: CT HEAD FINDINGS Brain: Stable cerebral atrophy, ventriculomegaly and periventricular white matter disease. No extra-axial fluid collections are identified. No CT findings for acute hemispheric infarction or intracranial hemorrhage. No mass lesions. The brainstem and cerebellum are normal. Vascular: Stable vascular calcifications. No aneurysm hyperdense vessels. Skull: No skull fracture or bone lesion. Sinuses/Orbits: The paranasal sinuses and mastoid air cells are clear. The globes are intact. Other: No scalp lesions or scalp hematoma. CT CERVICAL SPINE FINDINGS Alignment: Normal Skull base and vertebrae: No acute fracture. No primary bone lesion or focal pathologic process. Surgical changes from prior fusion procedure. Anterior and interbody fusion hardware at C5-6 and C6-7. No complicating features. Soft tissues and spinal canal: No prevertebral fluid or swelling. No visible canal hematoma. Disc levels: The spinal canal is fairly generous. No significant canal stenosis. There is a  wide decompressive laminectomy noted C6-7. The right C5-6 facet is fused and the left facet is mildly widened. Upper chest: The lung apices are grossly clear. Other: No neck mass adenopathy hematoma. Minimal scattered carotid artery calcifications. IMPRESSION: 1. Stable cerebral atrophy, ventriculomegaly and periventricular white matter disease. 2. No acute intracranial findings or skull fracture. 3. Normal alignment of the cervical vertebral bodies and no acute fracture. 4. Surgical changes from prior fusion procedure at C5-6 and C6-7. No complicating features. Electronically Signed   By: MYRTIS Stammer M.D.   On: 07/08/2024 13:36   CT Cervical Spine Wo Contrast Result Date: 07/08/2024 CLINICAL DATA:  Clemens.  Hit head.  Patient is on Plavix  EXAM: CT HEAD WITHOUT CONTRAST CT CERVICAL SPINE WITHOUT CONTRAST TECHNIQUE: Multidetector CT imaging of the head and cervical spine was performed following the standard protocol without intravenous contrast. Multiplanar CT image reconstructions of the cervical spine were also generated. RADIATION DOSE REDUCTION: This exam was performed according to the departmental dose-optimization program which includes automated exposure control, adjustment of the mA and/or kV according to patient size and/or use of iterative reconstruction technique. COMPARISON:  Head CT 06/01/2021 FINDINGS: CT HEAD FINDINGS Brain: Stable cerebral atrophy, ventriculomegaly and periventricular white matter disease. No extra-axial fluid collections are identified. No CT findings for acute hemispheric infarction or intracranial hemorrhage. No mass lesions. The brainstem and cerebellum are normal. Vascular: Stable vascular calcifications. No aneurysm hyperdense vessels. Skull: No skull fracture or bone lesion. Sinuses/Orbits: The paranasal sinuses and mastoid air cells are clear. The globes are intact. Other: No scalp lesions or scalp hematoma. CT CERVICAL SPINE FINDINGS Alignment: Normal Skull base and  vertebrae: No acute fracture. No primary bone lesion or focal pathologic process. Surgical changes from prior fusion procedure. Anterior and interbody fusion hardware at C5-6 and C6-7. No complicating features. Soft tissues and spinal canal: No prevertebral fluid or swelling. No visible canal hematoma. Disc levels: The spinal canal is fairly generous. No significant canal stenosis. There is a wide decompressive laminectomy noted C6-7. The right C5-6 facet is fused and the left facet is mildly widened. Upper chest: The lung apices are grossly clear. Other: No neck mass adenopathy hematoma. Minimal scattered carotid artery calcifications. IMPRESSION: 1. Stable cerebral atrophy, ventriculomegaly and periventricular white matter disease. 2. No  acute intracranial findings or skull fracture. 3. Normal alignment of the cervical vertebral bodies and no acute fracture. 4. Surgical changes from prior fusion procedure at C5-6 and C6-7. No complicating features. Electronically Signed   By: MYRTIS Stammer M.D.   On: 07/08/2024 13:36     Procedures   Medications Ordered in the ED - No data to display                                  Medical Decision Making Amount and/or Complexity of Data Reviewed Labs: ordered. Radiology: ordered.   This patient presents to the ED for concern of fall, this involves an extensive number of treatment options, and is a complaint that carries with it a high risk of complications and morbidity.  The differential diagnosis includes intercranial abnormality, infectious etiology, electrolyte abnormality, dehydration, cervical fracture   Co morbidities that complicate the patient evaluation  history of heart attack, GERD, hyperlipidemia, hypertension, asthma    Lab Tests:  I personally interpreted labs.  The pertinent results include:   Mild leukocytosis at 11.9, no anemia CMP 1.3 creatinine Troponin is 29, delta negative   Imaging Studies ordered:  I ordered imaging  studies including CT head, CT cervical spine I independently visualized and interpreted imaging which showed no acute findings I agree with the radiologist interpretation   Cardiac Monitoring: / EKG:  The patient was maintained on a cardiac monitor.  I personally viewed and interpreted the cardiac monitored which showed an underlying rhythm of: No ischemic changes   Consults:  I did discuss this case with cardiology discussed patient's presentation, labs and EKG and history.  He is recommending the patient follow-up outpatient and have cardiac monitor placed as well as repeat echo and he feels outpatient setting is appropriate as long as repeat troponins flat.  Problem List / ED Course / Critical interventions / Medication management  Patient presenting with multiple episodes of getting lightheaded during the day.  On my exam he has no focal neurologic deficits.  He denies any associated chest pain or shortness of breath.  He has not noticed any seizure activity.  He has been maintaining on heart cardiac monitor here.  His EKG shows right bundle blanch block.  CT head and cervical spine showed no acute findings.  CBC does show mild leukocytosis but no significant anemia.  CMP is overall unremarkable.  I did obtain troponin which is mildly elevated but repeat was flat.  I discussed case with cardiology who felt he is stable for discharge with outpatient follow-up recommending echo and Holter monitor I have reviewed the patients home medicines and have made adjustments as needed. She has been stable her stay here.  He ambulated with steady gait and has reassuring orthostatics.  He feels comfortable going home and reports he will call cardiology for follow-up.  Feel stable for discharge with outpatient follow-up        Final diagnoses:  Syncope and collapse    ED Discharge Orders          Ordered    Ambulatory referral to Cardiology       Comments: If you have not heard from the  Cardiology office within the next 72 hours please call [phone].   07/08/24 1911               Dearra Myhand, Warren SAILOR, PA-C 07/08/24 2110    Cottie Donnice PARAS, MD 07/08/24 [phone]

## 2024-07-08 NOTE — ED Notes (Signed)
 Patient ambulated from room to bathroom on pulse ox and did not desat

## 2024-07-08 NOTE — Telephone Encounter (Signed)
 Spoke to patient and he reports that he has been having episodes of lightheadedness and dizziness. Reviewed last visit when patient was seen by Lum Louis, NP. Pt had orthostatic hypotension and medications were changed during that appointment. Pt admitted that he had a recent fall 2 weeks ago after becoming lightheaded and dizzy. Pt fell and hit his head, lost consciousness and per his friend, pt's eyes rolled into back of head. Pt states that he did not go to emergency room. Pt reports that yesterday he had lightheadedness and dizziness again while walking. Pt states that he is taking Plavix  and a Aspirin  325 mg. Pt advised he needs to go to emergency room for evaluation due to symptomatic orthostatics and recent fall.   Also, does patient need to be on plavix  and aspirin  325 mg?

## 2024-07-08 NOTE — Telephone Encounter (Signed)
 STAT if patient feels like he/she is going to faint   1. Are you feeling dizzy, lightheaded, or faint right now?   Yes - but not as bad as yesterday.  Patient stated he does not feel like he is going to faint right now  2. Have you passd out?  No (If yes move to .[facility])  3. Do you have any other symptoms?   No  4. Have you checked your HR and BP (record if available)?   Not available   Patient stated he had a bad fall after feeling dizzy/lightheaded a couple of weeks ago (10/4).   Patient stated he also felt dizzy yesterday after church.  Patient noted his BP has been running low.

## 2024-07-08 NOTE — Telephone Encounter (Signed)
 Tried to contact pt to advise, unable to leave message.

## 2024-07-09 NOTE — Telephone Encounter (Signed)
 For review- are we managing?/ok to refill?

## 2024-07-09 NOTE — Telephone Encounter (Signed)
 Pt called back today. He was already set up for a f/u 11/5 with Lum, NP. He went to the ED yesterday and they told him to call in to get a heart monitor and an echo.

## 2024-07-09 NOTE — Telephone Encounter (Signed)
 Left voice message to call back 10/21

## 2024-07-10 NOTE — Telephone Encounter (Signed)
 Pt returning call

## 2024-07-10 NOTE — Telephone Encounter (Signed)
 Spoke with pt regarding Dr. Ranee suggestions. Pt will take Aspirin  81 mg and discontinue Aspirin  325 mg. An appointment was made for tomorrow 10/23 with Callie Goodrich, PA-C. It was suggested by the PA in the hospital that the pt wear a heart monitor again and get a repeat echo. Pt was told that Callie would be notified. Pt was given ED precautions. Pt verbalized understanding. All questions if any were answered.

## 2024-07-11 ENCOUNTER — Ambulatory Visit

## 2024-07-11 ENCOUNTER — Telehealth: Payer: Self-pay | Admitting: Gastroenterology

## 2024-07-11 ENCOUNTER — Encounter: Payer: Self-pay | Admitting: Student

## 2024-07-11 ENCOUNTER — Ambulatory Visit: Attending: Student | Admitting: Cardiology

## 2024-07-11 VITALS — BP 149/75 | HR 75 | Ht 67.0 in | Wt 202.6 lb

## 2024-07-11 DIAGNOSIS — I25118 Atherosclerotic heart disease of native coronary artery with other forms of angina pectoris: Secondary | ICD-10-CM | POA: Diagnosis not present

## 2024-07-11 DIAGNOSIS — I10 Essential (primary) hypertension: Secondary | ICD-10-CM

## 2024-07-11 DIAGNOSIS — R55 Syncope and collapse: Secondary | ICD-10-CM | POA: Diagnosis not present

## 2024-07-11 DIAGNOSIS — I739 Peripheral vascular disease, unspecified: Secondary | ICD-10-CM | POA: Diagnosis not present

## 2024-07-11 DIAGNOSIS — I951 Orthostatic hypotension: Secondary | ICD-10-CM

## 2024-07-11 DIAGNOSIS — E785 Hyperlipidemia, unspecified: Secondary | ICD-10-CM

## 2024-07-11 DIAGNOSIS — I779 Disorder of arteries and arterioles, unspecified: Secondary | ICD-10-CM | POA: Diagnosis not present

## 2024-07-11 NOTE — Patient Instructions (Addendum)
 Thank you for choosing [facility]!     Medication Instructions:  Discontinue the use of Imdur . This medication has been removed from your list.  Increase your hydration. Instructions listed  Wear your compressions stocking.   *If you need a refill on your cardiac medications before your next appointment, please call your pharmacy.*   Lab Work: No labs were ordered during today's visit.  If you have labs (blood work) drawn today and your tests are completely normal, you will receive your results only by: MyChart Message (if you have MyChart) OR A paper copy in the mail If you have any lab test that is abnormal or we need to change your treatment, we will call you to review the results.   Testing/Procedures: Your physician has requested that you have an echocardiogram. Echocardiography is a painless test that uses sound waves to create images of your heart. It provides your doctor with information about the size and shape of your heart and how well your heart's chambers and valves are working. This procedure takes approximately one hour. There are no restrictions for this procedure. Please do NOT wear cologne, perfume, aftershave, or lotions (deodorant is allowed). Please arrive 15 minutes prior to your appointment time.  Please note: We ask at that you not bring children with you during ultrasound (echo/ vascular) testing. Due to room size and safety concerns, children are not allowed in the ultrasound rooms during exams. Our front office staff cannot provide observation of children in our lobby area while testing is being conducted. An adult accompanying a patient to their appointment will only be allowed in the ultrasound room at the discretion of the ultrasound technician under special circumstances. We apologize for any inconvenience.     ZIO XT- Long Term Monitor Instructions   Your physician has requested you wear your ZIO patch monitor 14 days.   This is a single  patch monitor.  [employer] supplies one patch monitor per enrollment.  Additional stickers are not available.   Please do not apply patch if you will be having a Nuclear Stress Test, Echocardiogram, Cardiac CT, MRI, or Chest Xray during the time frame you would be wearing the monitor. The patch cannot be worn during these tests.  You cannot remove and re-apply the ZIO XT patch monitor.   Your ZIO patch monitor will be sent USPS Priority mail from [facility] directly to your home address. The monitor may also be mailed to a PO BOX if home delivery is not available.   It may take 3-5 days to receive your monitor after you have been enrolled.   Once you have received you monitor, please review enclosed instructions.  Your monitor has already been registered assigning a specific monitor serial # to you.   Applying the monitor   Shave hair from upper left chest.   Hold abrader disc by orange tab.  Rub abrader in 40 strokes over left upper chest as indicated in your monitor instructions.   Clean area with 4 enclosed alcohol  pads .  Use all pads to assure are is cleaned thoroughly.  Let dry.   Apply patch as indicated in monitor instructions.  Patch will be place under collarbone on left side of chest with arrow pointing upward.   Rub patch adhesive wings for 2 minutes.Remove white label marked 1.  Remove white label marked 2.  Rub patch adhesive wings for 2 additional minutes.   While looking in a mirror, press and release button in center  of patch.  A small green light will flash 3-4 times .  This will be your only indicator the monitor has been turned on.     Do not shower for the first 24 hours.  You may shower after the first 24 hours.   Press button if you feel a symptom. You will hear a small click.  Record Date, Time and Symptom in the Patient Log Book.   When you are ready to remove patch, follow instructions on last 2 pages of Patient Log Book.  Stick patch monitor onto last  page of Patient Log Book.   Place Patient Log Book in [place] box.  Use locking tab on box and tape box closed securely.  The [employer] has [employer] on it.  Please place in mailbox as soon as possible.  Your physician should have your test results approximately 7 days after the monitor has been mailed back to [facility].   Call [facility] Customer Care at [phone] if you have questions regarding your ZIO XT patch monitor.  Call them immediately if you see an orange light blinking on your monitor.   If your monitor falls off in less than 4 days contact our Monitor department at [phone].  If your monitor becomes loose or falls off after 4 days call [employer] at [phone] for suggestions on securing your monitor.    Your physician has requested that you have a carotid duplex. This test is an ultrasound of the carotid arteries in your neck. It looks at blood flow through these arteries that supply the brain with blood. Allow one hour for this exam. There are no restrictions or special instructions.   Your next appointment:   2 month(s)   Provider:   Dorn Lesches, MD or Thom Sluder     Follow-Up: At [facility], you and your health needs are our priority.  As part of our continuing mission to provide you with exceptional heart care, we have created designated Provider Care Teams.  These Care Teams include your primary Cardiologist (physician) and Advanced Practice Providers (APPs -  Physician Assistants and Nurse Practitioners) who all work together to provide you with the care you need, when you need it. We recommend signing up for the patient portal called MyChart.  Sign up information is provided on this After Visit Summary.  MyChart is used to connect with patients for Virtual Visits (Telemedicine).  Patients are able to view lab/test results, encounter notes, upcoming appointments, etc.  Non-urgent messages can be sent to your provider as well.    To learn more about what you can do with MyChart, go to [email or web address].

## 2024-07-11 NOTE — Progress Notes (Unsigned)
 Applied a 14 day Zio XT monitor to patient in the office  Court to read

## 2024-07-11 NOTE — Telephone Encounter (Signed)
 PT is calling for a follow up appointment. He was recently seen in hospital for a diverticulitis flare and he is now on antibiotics for 15 days. He would like the provider's recommendations on moving forward. Please advise.

## 2024-07-11 NOTE — Progress Notes (Signed)
 Cardiology Office Note:  .   Date:  07/11/2024  ID:  Jared [name], DOB [birth year], MRN [id number] PCP: Shepard Ade, MD  [facility] Providers Cardiologist:  Dorn Lesches, MD {  History of Present Illness: .   Jared [name] is a 73 y.o. male with history of CAD with PCI RCA 2000, PAD status post right TPT trunk balloon angioplasty followed by [facility] 09/2023, carotid artery stenosis, Parkinson's     CAD Stent RCA in 2000 2011 cardiac catheterization widely patent stent in RCA  PAD right TPT trunk balloon angioplasty followed by [facility] for lifestyle-limiting claudication. His ABI has normalized post procedure.  Had self resolving pseudoaneurysm. Did not find Pletal  helpful 02/2024 lower extremity Dopplers with patent stent.    Syncope 01/2023 episode of dizziness/syncope attributed to overmedication 02/2023 19 runs of VT, 13 runs of SVT.  Echocardiogram normal.  Asymptomatic. 03/2023 normal echocardiogram  Carotid artery disease Right ICA 40 to 59% stenosis, left 1 to 39% stenosis.  ECA greater than 50% stenosis.     Patient with history of remote RCA stenting and PAD status post TPT trunk angioplasty with excellent results.  He was last seen in clinic 05/2024 reporting lightheadedness dizziness particularly when going sitting up, standing or bending over.  Reported fatigue, shortness of breath with exertion.  No chest pain.  Symptoms felt to be related to orthostatic hypotension.  Had positive orthostatics.  Amlodipine  discontinued.  Shortness of breath felt related to recent diagnosis of pneumonia and he was started on antibiotics.  BMP ordered normal.  07/08/2024 patient presented to the emergency room after a fall, he had reported episode 16 days ago when he was walking to his car and became suddenly lightheaded and collapsed.  Bystanders said that his eyes were rolling back.  Continued to have similar like episodes prompting ED evaluation.  CT imaging benign troponin  mildly elevated but flat.  He was recommended to follow-up with cardiology outpatient.  Today patient presents after ER visit.  He reports ongoing and recurrent issues of syncope always preceded by sensation of feeling lightheaded and dizzy.  Has had no palpitations or chest pain.  Episodes are generally when he is changing from seated to standing or bending over.  Sometimes can occur if he is standing for prolonged amount of time although less frequent.  During 1 episode he was bending over and started feeling lightheaded so immediately stood up and passed out.  His friend said that his eyes had rolled back and he had looked pale.  Symptoms are never while he is seated.  Also reports neck pain but with known degenerative disc disease.  No longer having any shortness of breath.  ROS: Denies: Chest pain, shortness of breath, orthopnea, peripheral edema, palpitations, decreased exercise intolerance, fatigue, lightheadedness.   Studies Reviewed: SABRA    EKG Interpretation Date/Time:  Thursday July 11 2024 08:45:29 EDT Ventricular Rate:  77 PR Interval:  146 QRS Duration:  116 QT Interval:  384 QTC Calculation: 434 R Axis:   26  Text Interpretation: Normal sinus rhythm Incomplete right bundle branch block T wave abnormality, consider anterior ischemia When compared with ECG of 08-Jul-2024 17:25, Confirmed by Darryle Currier [phone]) on 07/11/2024 8:51:23 AM    Risk Assessment/Calculations:      Physical Exam:   VS:  BP (!) 149/75 (BP Location: Right Arm, Patient Position: Sitting, Cuff Size: Normal)   Pulse 75   Ht 5' 7 (1.702 m)   Wt 202 lb 9.6 oz (  91.9 kg)   SpO2 97%   BMI 31.73 kg/m    Wt Readings from Last 3 Encounters:  07/11/24 202 lb 9.6 oz (91.9 kg)  06/06/24 214 lb 6.4 oz (97.3 kg)  06/05/24 202 lb (91.6 kg)    GEN: Well nourished, well developed in no acute distress NECK: No JVD; No carotid bruits CARDIAC: RRR, no murmurs, rubs, gallops RESPIRATORY:  Clear to auscultation  without rales, wheezing or rhonchi  ABDOMEN: Soft, non-tender, non-distended EXTREMITIES:  No edema; No deformity   ASSESSMENT AND PLAN: .    Syncope -02/2023 heart monitor 19 beats of VT, 13 beat run of SVT. -03/2023 normal echocardiogram Recurrent history.  Positive orthostatics last visit. Presentation seems overall consistent with orthostasis but he still with persistent symptoms and sometimes occur while he is just standing for prolonged periods.  I think this is exacerbated by his underlying Parkinson's disease causing autonomic dysfunction.  However will rule out underlying cardiogenic related etiologies. Will order echocardiogram, 2-week live monitor, repeating carotid Dopplers. Stopping Imdur  Given he has been so orthostatic multiple medications have been discontinued previously including telmisartan and amlodipine . More lenient blood pressure parameters might be appropriate to prevent further episodes. Previous orthostatic vital signs.  Supine 141/79, sitting 139/75, standing 119/72 with at least a 20 point drop from sitting to standing  CAD Hyperlipidemia -History of RCA stent 2000 -2011 patent RCA stent. Stable disease without any anginal complaints or equivalents. Chronically has been on aspirin  Plavix .  He is statin intolerant so he is on Repatha .  Continue Toprol -XL 25 mg. LDL is 77 09/2023.   PAD -Right TPT trunk balloon angioplasty followed by [facility]  -02/2024 lower extremity Dopplers with patent stent.  Normal ABIs. No claudication symptoms, stable.  Continue in the context above. Chronically has been on DAPT therapy.  Will route note to primary cardiologist.  Low-dose Xarelto 2.5 mg with aspirin  has shown statistical benefit.  Could consider monotherapy with antiplatelet but I do not think he needs DAPT stable.  Carotid artery disease Carotid duplex 05/2023 shows right ICA consistent with 40-59% stenosis in left ICA consistent with 1-39% stenosis Repeating as above given  associated symptoms of syncope.    Dispo: Follow-up in 2 months with myself or Dr. Court  Signed, Thom LITTIE Sluder, PA-C

## 2024-07-12 DIAGNOSIS — I129 Hypertensive chronic kidney disease with stage 1 through stage 4 chronic kidney disease, or unspecified chronic kidney disease: Secondary | ICD-10-CM | POA: Diagnosis not present

## 2024-07-12 DIAGNOSIS — R55 Syncope and collapse: Secondary | ICD-10-CM | POA: Diagnosis not present

## 2024-07-12 DIAGNOSIS — B029 Zoster without complications: Secondary | ICD-10-CM | POA: Diagnosis not present

## 2024-07-12 DIAGNOSIS — M25511 Pain in right shoulder: Secondary | ICD-10-CM | POA: Diagnosis not present

## 2024-07-12 DIAGNOSIS — N1831 Chronic kidney disease, stage 3a: Secondary | ICD-10-CM | POA: Diagnosis not present

## 2024-07-12 DIAGNOSIS — R10A2 Flank pain, left side: Secondary | ICD-10-CM | POA: Diagnosis not present

## 2024-07-12 DIAGNOSIS — K5792 Diverticulitis of intestine, part unspecified, without perforation or abscess without bleeding: Secondary | ICD-10-CM | POA: Diagnosis not present

## 2024-07-12 DIAGNOSIS — E119 Type 2 diabetes mellitus without complications: Secondary | ICD-10-CM | POA: Diagnosis not present

## 2024-07-12 NOTE — Telephone Encounter (Signed)
 Please have  primary provider to refill medication - Levothyroxine 

## 2024-07-12 NOTE — Telephone Encounter (Signed)
Attempted to reach patient- LVM

## 2024-07-15 ENCOUNTER — Ambulatory Visit: Admitting: Physician Assistant

## 2024-07-15 ENCOUNTER — Encounter: Payer: Self-pay | Admitting: Physician Assistant

## 2024-07-15 ENCOUNTER — Telehealth: Payer: Self-pay | Admitting: Physician Assistant

## 2024-07-15 ENCOUNTER — Encounter (HOSPITAL_BASED_OUTPATIENT_CLINIC_OR_DEPARTMENT_OTHER): Payer: Self-pay

## 2024-07-15 ENCOUNTER — Inpatient Hospital Stay (HOSPITAL_BASED_OUTPATIENT_CLINIC_OR_DEPARTMENT_OTHER)
Admission: EM | Admit: 2024-07-15 | Discharge: 2024-07-18 | DRG: 392 | Disposition: A | Discharge status: Home / Self Care | Attending: Internal Medicine | Admitting: Internal Medicine

## 2024-07-15 ENCOUNTER — Ambulatory Visit: Payer: Self-pay | Admitting: Physician Assistant

## 2024-07-15 ENCOUNTER — Other Ambulatory Visit: Payer: Self-pay | Admitting: Physician Assistant

## 2024-07-15 ENCOUNTER — Ambulatory Visit (HOSPITAL_BASED_OUTPATIENT_CLINIC_OR_DEPARTMENT_OTHER)
Admission: RE | Admit: 2024-07-15 | Discharge: 2024-07-15 | Disposition: A | Discharge status: Home / Self Care | Source: Ambulatory Visit | Attending: Physician Assistant | Admitting: Physician Assistant

## 2024-07-15 ENCOUNTER — Other Ambulatory Visit: Payer: Self-pay

## 2024-07-15 ENCOUNTER — Other Ambulatory Visit (INDEPENDENT_AMBULATORY_CARE_PROVIDER_SITE_OTHER)

## 2024-07-15 VITALS — BP 112/66 | HR 77 | Ht 67.0 in | Wt 198.2 lb

## 2024-07-15 DIAGNOSIS — K5792 Diverticulitis of intestine, part unspecified, without perforation or abscess without bleeding: Secondary | ICD-10-CM | POA: Insufficient documentation

## 2024-07-15 DIAGNOSIS — Z833 Family history of diabetes mellitus: Secondary | ICD-10-CM

## 2024-07-15 DIAGNOSIS — E785 Hyperlipidemia, unspecified: Secondary | ICD-10-CM | POA: Diagnosis present

## 2024-07-15 DIAGNOSIS — Z79890 Hormone replacement therapy (postmenopausal): Secondary | ICD-10-CM

## 2024-07-15 DIAGNOSIS — K222 Esophageal obstruction: Secondary | ICD-10-CM | POA: Diagnosis present

## 2024-07-15 DIAGNOSIS — K219 Gastro-esophageal reflux disease without esophagitis: Secondary | ICD-10-CM | POA: Diagnosis present

## 2024-07-15 DIAGNOSIS — R1032 Left lower quadrant pain: Secondary | ICD-10-CM

## 2024-07-15 DIAGNOSIS — Z9079 Acquired absence of other genital organ(s): Secondary | ICD-10-CM

## 2024-07-15 DIAGNOSIS — Z881 Allergy status to other antibiotic agents status: Secondary | ICD-10-CM

## 2024-07-15 DIAGNOSIS — Z9862 Peripheral vascular angioplasty status: Secondary | ICD-10-CM | POA: Diagnosis not present

## 2024-07-15 DIAGNOSIS — Z8679 Personal history of other diseases of the circulatory system: Secondary | ICD-10-CM | POA: Diagnosis not present

## 2024-07-15 DIAGNOSIS — I6529 Occlusion and stenosis of unspecified carotid artery: Secondary | ICD-10-CM | POA: Diagnosis present

## 2024-07-15 DIAGNOSIS — I951 Orthostatic hypotension: Secondary | ICD-10-CM | POA: Diagnosis present

## 2024-07-15 DIAGNOSIS — E039 Hypothyroidism, unspecified: Secondary | ICD-10-CM | POA: Diagnosis present

## 2024-07-15 DIAGNOSIS — Z7902 Long term (current) use of antithrombotics/antiplatelets: Secondary | ICD-10-CM | POA: Diagnosis not present

## 2024-07-15 DIAGNOSIS — K5730 Diverticulosis of large intestine without perforation or abscess without bleeding: Secondary | ICD-10-CM | POA: Diagnosis not present

## 2024-07-15 DIAGNOSIS — Z91041 Radiographic dye allergy status: Secondary | ICD-10-CM

## 2024-07-15 DIAGNOSIS — K5900 Constipation, unspecified: Secondary | ICD-10-CM | POA: Diagnosis present

## 2024-07-15 DIAGNOSIS — Z96653 Presence of artificial knee joint, bilateral: Secondary | ICD-10-CM | POA: Diagnosis present

## 2024-07-15 DIAGNOSIS — Z905 Acquired absence of kidney: Secondary | ICD-10-CM | POA: Diagnosis not present

## 2024-07-15 DIAGNOSIS — B029 Zoster without complications: Secondary | ICD-10-CM | POA: Diagnosis present

## 2024-07-15 DIAGNOSIS — I10 Essential (primary) hypertension: Secondary | ICD-10-CM | POA: Diagnosis present

## 2024-07-15 DIAGNOSIS — Z880 Allergy status to penicillin: Secondary | ICD-10-CM

## 2024-07-15 DIAGNOSIS — R1085 Abdominal pain of multiple sites: Secondary | ICD-10-CM

## 2024-07-15 DIAGNOSIS — Z8616 Personal history of COVID-19: Secondary | ICD-10-CM | POA: Diagnosis not present

## 2024-07-15 DIAGNOSIS — Z860101 Personal history of adenomatous and serrated colon polyps: Secondary | ICD-10-CM

## 2024-07-15 DIAGNOSIS — Z7985 Long-term (current) use of injectable non-insulin antidiabetic drugs: Secondary | ICD-10-CM

## 2024-07-15 DIAGNOSIS — Z818 Family history of other mental and behavioral disorders: Secondary | ICD-10-CM

## 2024-07-15 DIAGNOSIS — F419 Anxiety disorder, unspecified: Secondary | ICD-10-CM | POA: Diagnosis present

## 2024-07-15 DIAGNOSIS — Z8349 Family history of other endocrine, nutritional and metabolic diseases: Secondary | ICD-10-CM

## 2024-07-15 DIAGNOSIS — K5732 Diverticulitis of large intestine without perforation or abscess without bleeding: Principal | ICD-10-CM | POA: Diagnosis present

## 2024-07-15 DIAGNOSIS — Z6372 Alcoholism and drug addiction in family: Secondary | ICD-10-CM

## 2024-07-15 DIAGNOSIS — R1012 Left upper quadrant pain: Secondary | ICD-10-CM | POA: Diagnosis not present

## 2024-07-15 DIAGNOSIS — R935 Abnormal findings on diagnostic imaging of other abdominal regions, including retroperitoneum: Secondary | ICD-10-CM | POA: Diagnosis not present

## 2024-07-15 DIAGNOSIS — Z955 Presence of coronary angioplasty implant and graft: Secondary | ICD-10-CM | POA: Diagnosis not present

## 2024-07-15 DIAGNOSIS — Z8719 Personal history of other diseases of the digestive system: Secondary | ICD-10-CM

## 2024-07-15 DIAGNOSIS — I252 Old myocardial infarction: Secondary | ICD-10-CM

## 2024-07-15 DIAGNOSIS — G20C Parkinsonism, unspecified: Secondary | ICD-10-CM | POA: Diagnosis present

## 2024-07-15 DIAGNOSIS — I2510 Atherosclerotic heart disease of native coronary artery without angina pectoris: Secondary | ICD-10-CM | POA: Diagnosis present

## 2024-07-15 DIAGNOSIS — N289 Disorder of kidney and ureter, unspecified: Secondary | ICD-10-CM | POA: Diagnosis not present

## 2024-07-15 DIAGNOSIS — G20A1 Parkinson's disease without dyskinesia, without mention of fluctuations: Secondary | ICD-10-CM | POA: Diagnosis present

## 2024-07-15 DIAGNOSIS — N281 Cyst of kidney, acquired: Secondary | ICD-10-CM | POA: Diagnosis not present

## 2024-07-15 DIAGNOSIS — Z79899 Other long term (current) drug therapy: Secondary | ICD-10-CM | POA: Diagnosis not present

## 2024-07-15 DIAGNOSIS — Z811 Family history of alcohol abuse and dependence: Secondary | ICD-10-CM

## 2024-07-15 DIAGNOSIS — Z825 Family history of asthma and other chronic lower respiratory diseases: Secondary | ICD-10-CM

## 2024-07-15 DIAGNOSIS — R10A2 Flank pain, left side: Secondary | ICD-10-CM | POA: Diagnosis present

## 2024-07-15 DIAGNOSIS — Z743 Need for continuous supervision: Secondary | ICD-10-CM | POA: Diagnosis not present

## 2024-07-15 DIAGNOSIS — Z882 Allergy status to sulfonamides status: Secondary | ICD-10-CM

## 2024-07-15 DIAGNOSIS — Z860100 Personal history of colon polyps, unspecified: Secondary | ICD-10-CM

## 2024-07-15 DIAGNOSIS — Z888 Allergy status to other drugs, medicaments and biological substances status: Secondary | ICD-10-CM

## 2024-07-15 DIAGNOSIS — Z800 Family history of malignant neoplasm of digestive organs: Secondary | ICD-10-CM

## 2024-07-15 DIAGNOSIS — Z8249 Family history of ischemic heart disease and other diseases of the circulatory system: Secondary | ICD-10-CM

## 2024-07-15 DIAGNOSIS — J45909 Unspecified asthma, uncomplicated: Secondary | ICD-10-CM | POA: Diagnosis present

## 2024-07-15 DIAGNOSIS — Z85528 Personal history of other malignant neoplasm of kidney: Secondary | ICD-10-CM

## 2024-07-15 DIAGNOSIS — I739 Peripheral vascular disease, unspecified: Secondary | ICD-10-CM | POA: Diagnosis present

## 2024-07-15 DIAGNOSIS — Z886 Allergy status to analgesic agent status: Secondary | ICD-10-CM

## 2024-07-15 DIAGNOSIS — Z7982 Long term (current) use of aspirin: Secondary | ICD-10-CM | POA: Diagnosis not present

## 2024-07-15 DIAGNOSIS — Z981 Arthrodesis status: Secondary | ICD-10-CM

## 2024-07-15 DIAGNOSIS — Z885 Allergy status to narcotic agent status: Secondary | ICD-10-CM

## 2024-07-15 LAB — CBC WITH DIFFERENTIAL/PLATELET
Basophils Absolute: 0 K/uL (ref 0.0–0.1)
Basophils Relative: 0.4 % (ref 0.0–3.0)
Eosinophils Absolute: 0.1 K/uL (ref 0.0–0.7)
Eosinophils Relative: 0.9 % (ref 0.0–5.0)
HCT: 42 % (ref 39.0–52.0)
Hemoglobin: 13.3 g/dL (ref 13.0–17.0)
Lymphocytes Relative: 16.2 % (ref 12.0–46.0)
Lymphs Abs: 1.6 K/uL (ref 0.7–4.0)
MCHC: 31.6 g/dL (ref 30.0–36.0)
MCV: 80.4 fl (ref 78.0–100.0)
Monocytes Absolute: 1.3 K/uL — ABNORMAL HIGH (ref 0.1–1.0)
Monocytes Relative: 12.7 % — ABNORMAL HIGH (ref 3.0–12.0)
Neutro Abs: 7.1 K/uL (ref 1.4–7.7)
Neutrophils Relative %: 69.8 % (ref 43.0–77.0)
Platelets: 323 K/uL (ref 150.0–400.0)
RBC: 5.22 Mil/uL (ref 4.22–5.81)
RDW: 16.9 % — ABNORMAL HIGH (ref 11.5–15.5)
WBC: 10.2 K/uL (ref 4.0–10.5)

## 2024-07-15 LAB — CBC
HCT: 42.1 % (ref 39.0–52.0)
Hemoglobin: 13.3 g/dL (ref 13.0–17.0)
MCH: 25.7 pg — ABNORMAL LOW (ref 26.0–34.0)
MCHC: 31.6 g/dL (ref 30.0–36.0)
MCV: 81.4 fL (ref 80.0–100.0)
Platelets: 313 K/uL (ref 150–400)
RBC: 5.17 MIL/uL (ref 4.22–5.81)
RDW: 16.5 % — ABNORMAL HIGH (ref 11.5–15.5)
WBC: 11.6 K/uL — ABNORMAL HIGH (ref 4.0–10.5)
nRBC: 0 % (ref 0.0–0.2)

## 2024-07-15 LAB — COMPREHENSIVE METABOLIC PANEL WITH GFR
ALT: 5 U/L (ref 0–53)
ALT: <5 U/L (ref 0–44)
AST: 12 U/L (ref 0–37)
AST: 17 U/L (ref 15–41)
Albumin: 4.2 g/dL (ref 3.5–5.2)
Albumin: 4.3 g/dL (ref 3.5–5.0)
Alkaline Phosphatase: 66 U/L (ref 39–117)
Alkaline Phosphatase: 81 U/L (ref 38–126)
Anion gap: 12 (ref 5–15)
BUN: 27 mg/dL — ABNORMAL HIGH (ref 6–23)
BUN: 26 mg/dL — ABNORMAL HIGH (ref 8–23)
CO2: 25 meq/L (ref 19–32)
CO2: 23 mmol/L (ref 22–32)
Calcium: 9.1 mg/dL (ref 8.4–10.5)
Calcium: 9.3 mg/dL (ref 8.9–10.3)
Chloride: 103 meq/L (ref 96–112)
Chloride: 98 mmol/L (ref 98–111)
Creatinine, Ser: 1.35 mg/dL (ref 0.40–1.50)
Creatinine, Ser: 1.41 mg/dL — ABNORMAL HIGH (ref 0.61–1.24)
GFR, Estimated: 52 mL/min — ABNORMAL LOW (ref >60)
GFR: 51.29 mL/min — ABNORMAL LOW (ref >60.00)
Glucose, Bld: 119 mg/dL — ABNORMAL HIGH (ref 70–99)
Glucose, Bld: 95 mg/dL (ref 70–99)
Potassium: 4.3 meq/L (ref 3.5–5.1)
Potassium: 3.9 mmol/L (ref 3.5–5.1)
Sodium: 136 meq/L (ref 135–145)
Sodium: 132 mmol/L — ABNORMAL LOW (ref 135–145)
Total Bilirubin: 0.3 mg/dL (ref 0.2–1.2)
Total Bilirubin: 0.3 mg/dL (ref 0.0–1.2)
Total Protein: 7.9 g/dL (ref 6.0–8.3)
Total Protein: 7.9 g/dL (ref 6.5–8.1)

## 2024-07-15 LAB — URINALYSIS, ROUTINE W REFLEX MICROSCOPIC
Bilirubin Urine: NEGATIVE
Glucose, UA: NEGATIVE mg/dL
Hgb urine dipstick: NEGATIVE
Ketones, ur: NEGATIVE mg/dL
Leukocytes,Ua: NEGATIVE
Nitrite: NEGATIVE
Protein, ur: NEGATIVE mg/dL
Specific Gravity, Urine: 1.006 (ref 1.005–1.030)
pH: 6 (ref 5.0–8.0)

## 2024-07-15 LAB — LIPASE, BLOOD: Lipase: 16 U/L (ref 11–51)

## 2024-07-15 LAB — LIPASE: Lipase: 13 U/L (ref 11.0–59.0)

## 2024-07-15 MED ORDER — ONDANSETRON HCL 4 MG/2ML IJ SOLN
4.0000 mg | Freq: Once | INTRAMUSCULAR | Status: AC
  Administered 2024-07-15: 4 mg via INTRAVENOUS
  Filled 2024-07-15: qty 2

## 2024-07-15 MED ORDER — ONDANSETRON HCL 4 MG/2ML IJ SOLN: 4.0000 mg | INTRAMUSCULAR | Status: DC | PRN

## 2024-07-15 MED ORDER — SODIUM CHLORIDE 0.9 % IV SOLN
2.0000 g | Freq: Once | INTRAVENOUS | Status: AC
  Administered 2024-07-15: 2 g via INTRAVENOUS
  Filled 2024-07-15: qty 20

## 2024-07-15 MED ORDER — MORPHINE SULFATE (PF) 4 MG/ML IV SOLN
4.0000 mg | INTRAVENOUS | Status: DC | PRN
  Administered 2024-07-16: 4 mg via INTRAVENOUS
  Filled 2024-07-15: qty 1

## 2024-07-15 MED ORDER — MORPHINE SULFATE (PF) 4 MG/ML IV SOLN
4.0000 mg | Freq: Once | INTRAVENOUS | Status: AC
  Administered 2024-07-15: 4 mg via INTRAVENOUS
  Filled 2024-07-15: qty 1

## 2024-07-15 MED ORDER — SODIUM CHLORIDE 0.9 % IV BOLUS: 1000.0000 mL | Freq: Once | INTRAVENOUS | Status: DC

## 2024-07-15 MED ORDER — METRONIDAZOLE 500 MG/100ML IV SOLN
500.0000 mg | Freq: Once | INTRAVENOUS | Status: AC
  Administered 2024-07-15: 500 mg via INTRAVENOUS
  Filled 2024-07-15: qty 100

## 2024-07-15 MED ORDER — DIPHENHYDRAMINE HCL 25 MG PO CAPS
25.0000 mg | ORAL_CAPSULE | Freq: Once | ORAL | Status: AC
  Administered 2024-07-15: 25 mg via ORAL
  Filled 2024-07-15: qty 1

## 2024-07-15 MED ORDER — SODIUM CHLORIDE 0.9 % IV BOLUS
1000.0000 mL | Freq: Once | INTRAVENOUS | Status: AC
  Administered 2024-07-15: 1000 mL via INTRAVENOUS

## 2024-07-15 MED ORDER — IOHEXOL 300 MG/ML  SOLN: 100.0000 mL | Freq: Once | INTRAMUSCULAR | Status: DC | PRN

## 2024-07-15 MED ORDER — FAMOTIDINE IN NACL 20-0.9 MG/50ML-% IV SOLN
20.0000 mg | Freq: Once | INTRAVENOUS | Status: AC
  Administered 2024-07-15: 20 mg via INTRAVENOUS
  Filled 2024-07-15: qty 50

## 2024-07-15 NOTE — Progress Notes (Signed)
 Ellouise Console, PA-C [address] [place], [place]  [address] Phone: [phone]   Primary Care Physician: Shepard Ade, MD  Primary Gastroenterologist:  Ellouise Console, PA-C / Elspeth Naval, MD   Chief Complaint:  Abdominal Pain, Diverticulitis       HPI:   Discussed the use of AI scribe software for clinical note transcription with the patient, who gave verbal consent to proceed.  History of Present Illness Jared [name] is a 73 year old male with diverticulitis who presents with severe left sided abdominal pain.  He has a history of diverticulitis and is currently experiencing severe left-sided abdominal pain. He reports that a CT scan of the abdomen and pelvis was performed on June 28, 2024 (ordered by his PCP), and he was told it showed diverticulitis in the descending and sigmoid colon. He has been on a course of antibiotics including clindamycin , ciprofloxacin , and metronidazole  since the CT scan x 14 days. Despite this treatment, the pain, located in the left upper abdomen, left side, and left lower abdomen, has been constant and severe, reaching a 10 out of 10 on the pain scale over the past two to three days. The pain initially started lower in the abdomen but has since moved to the left upper quadrant. He has been taking hydrocodone  for pain, but it does not alleviate the pain. No fever, chills, nausea, vomiting, constipation, or blood in stool. Bowel movements are daily and formed.  In addition to his abdominal issues, he has a recent diagnosis of shingles on his right posterior shoulder blade, which began around October 22 or 23, 2025. He describes the pain as feeling like 'a knife twisting' in his shoulder blade. He is taking medication for shingles three times a day and uses lidocaine  spray and patches for pain relief.  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 He has a history of back surgeries and uses a spinal cord stimulator for pain management. He is currently wearing a heart monitor due to fluctuations in blood pressure and has been experiencing dizzy spells.  He states he was treated for an bilateral pneumonia 1 month ago.  06/28/2024 CT abdomen pelvis without contrast: 1. Inflammatory changes at the descendingsigmoid junction without abscess, consistent with uncomplicated diverticulitis. 2. Additional non-urgent findings as above.  08/2022 capsule endoscopy: Normal, negative.  07/2022 EGD by Dr. Naval: Normal.  06/01/2021 screening colonoscopy by Dr. Naval: 6 diminutive adenomatous polyps with diverticulosis.  3-year repeat (due 05/2024).  10/28/2021 patient had CT abdomen pelvis due to left lower quadrant pain with vomiting and cramping for 1 month found to have abnormal mesenteric stranding sigmoid colon likely diverticulitis and middle lobe atelectasis.  Diffuse hepatic steatosis  PMH: CAD with stent, Parkinson's, sleep apnea on CPAP, GERD, hypothyroidism, GERD, Hx diverticulitis, chronic constipation.  History of IDA.  Hx Left nephrectomy 1997 / Left kidney cancer.  Currently on Plavix  and aspirin .   Patient had 2 episodes of diverticulitis 11/08/2021 and 07/08/2024 confirmed on CT scans.  No other episodes of diverticulitis in the past 3 years.  Current Outpatient Medications  Medication Sig Dispense Refill   albuterol  (PROVENTIL ) (2.5 MG/3ML) 0.083% nebulizer solution Take 3 mLs (2.5 mg total) by nebulization every 6 (six) hours as needed for wheezing or shortness of breath. 75 mL 12   albuterol  (VENTOLIN  HFA) 108 (90 Base) MCG/ACT inhaler Inhale 2  puffs into the lungs every 4 (four) hours as needed for wheezing or shortness of breath. Can inhale two puffs every four to six hours as needed for cough, wheeze, shortness of breath, or chest tightness. 18 g 1   aspirin  EC 81 MG tablet Take 1 tablet (81 mg total) by  mouth daily. Swallow whole. 30 tablet 12   BREO ELLIPTA 200-25 MCG/ACT AEPB Inhale 1 puff into the lungs daily.     carbidopa-levodopa (SINEMET IR) 25-100 MG tablet Take 1 tablet by mouth 3 (three) times daily.     clopidogrel  (PLAVIX ) 75 MG tablet Take 1 tablet (75 mg total) by mouth daily with breakfast. 90 tablet 2   Coenzyme Q10 (CO Q-10 PO) Take 1 capsule by mouth daily.     doxycycline  (VIBRAMYCIN ) 100 MG capsule Take 1 capsule (100 mg total) by mouth 2 (two) times daily. 20 capsule 0   DULoxetine  (CYMBALTA ) 60 MG capsule Take 60 mg by mouth every morning.     EPINEPHrine  0.3 mg/0.3 mL IJ SOAJ injection Inject 0.3 mg into the muscle as needed for anaphylaxis.     levothyroxine  (SYNTHROID ) 75 MCG tablet Take 75 mcg by mouth daily before breakfast.     metoprolol  succinate (TOPROL -XL) 25 MG 24 hr tablet TAKE 1 TABLET(25 MG) BY MOUTH DAILY 90 tablet 3   Multiple Vitamin (MULTIVITAMIN) tablet Take 1 tablet by mouth at bedtime.      NUCALA  100 MG/ML SOSY Inject 1 mL into the skin every 30 (thirty) days.     pantoprazole  (PROTONIX ) 40 MG tablet TAKE 1 TABLET BY MOUTH DAILY 90 tablet 3   REPATHA  SURECLICK 140 MG/ML SOAJ INJECT 1 PEN INTO THE SKIN EVERY 14 DAYS 2 mL 11   sodium chloride  (OCEAN) 0.65 % SOLN nasal spray Place 1 spray into both nostrils as needed for congestion.     Spacer/Aero-Holding Chambers (OPTICHAMBER DIAMOND ) MISC 1 each by Other route daily. Use as directed with inhaler. 1 each 1   vitamin C  (ASCORBIC ACID ) 500 MG tablet Take 500 mg by mouth 2 (two) times daily.      zinc  gluconate 50 MG tablet Take 50 mg by mouth daily.     Current Facility-Administered Medications  Medication Dose Route Frequency Provider Last Rate Last Admin   omalizumab  (XOLAIR ) injection 150 mg  150 mg Subcutaneous Q28 days Cari Arlean HERO, FNP   150 mg at 06/20/22 1503   omalizumab  (XOLAIR ) prefilled syringe 150 mg  150 mg Subcutaneous Q28 days Tobie Arleta SQUIBB, MD   150 mg at 08/23/22 1113    Allergies  as of 07/15/2024 - Review Complete 07/11/2024  Allergen Reaction Noted   Azithromycin  Anaphylaxis 03/20/2023   Nsaids Other (See Comments) 10/12/2011   Penicillins Hives, Itching, Other (See Comments), and Nausea And Vomiting 10/30/2018   Misc. sulfonamide containing compounds Hives 12/20/2022   Codeine Nausea Only and Nausea And Vomiting 10/30/2018   Penicillamine Rash 12/20/2022   Pholcodine Rash 04/04/2022   Statins Other (See Comments) 07/04/2013   Sulfonamide derivatives Rash 12/20/2022    Past Medical History:  Diagnosis Date   Anxiety    Asthma    Cervical vertebral fusion 05/02/2014   Now presenting with C5-6 radiculopathy   Coronary artery disease    COVID 01/2020   pain all over monoclonal antibodies given all symptoms resolved   GERD (gastroesophageal reflux disease)    H/O heart artery stent 2000   to right rca   Heart attack [facility]) 2009  History of depression 03/19/2021   History of esophageal stricture    History of spinal fracture 2015   Hyperlipidemia    Hypertension    Hypothyroidism    Neuroleptic induced parkinsonism    left arm tremor   Osteoarthritis    lower back   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   Renal cell carcinoma 1997   Left kidney   Sleep apnea    wears CPAP   Sleep talking    Snoring 12/25/2013   Wears glasses     Past Surgical History:  Procedure Laterality Date   ABDOMINAL AORTOGRAM W/LOWER EXTREMITY N/A 10/19/2023   Procedure: ABDOMINAL AORTOGRAM W/LOWER EXTREMITY;  Surgeon: Court Dorn PARAS, MD;  Location: [facility] IN[facility]AS[facility] CV [facility];  Service: Cardiovascular;  Laterality: N/A;   ANTERIOR CERVICAL DECOMP/DISCECTOMY FUSION N/A 08/27/2014   Procedure: Cervical six-seven anterior cervical decompression fusion with removal of hardware at Cervical five-six;  Surgeon: Fairy Levels, MD;  Location: [facility] NEURO ORS;  Service: Neurosurgery;  Laterality: N/A;  Cervical six-seven anterior cervical decompression fusion with removal of  hardware at Cervical five-six   BACK SURGERY     1992, 2013 lower back   CARDIAC CATHETERIZATION  05/10/2007   Minimal CAD, normal LV systolic function, medical management   CARDIAC CATHETERIZATION  04/08/2008   RCA ulcerated 70-80% stenosis, stented with a 3x69mm Endeavor stent at 13atm for 50sec, reduced from 80% ulcerated stenosis to 0%.   CARDIAC CATHETERIZATION  05/07/2009   50% distal left main disease-IVUS or flow wire too dangerous in particular setting to perform intervention.   CARDIAC CATHETERIZATION  03/18/2010   Medical management   CARDIOVASCULAR STRESS TEST  02/09/2012   Normal, no significant wall abnormalities noted   CARPAL TUNNEL RELEASE Bilateral 2000   COLONOSCOPY     COLONOSCOPY W/ BIOPSIES AND POLYPECTOMY     COLONOSCOPY WITH PROPOFOL   06/01/2021   Elspeth Naval at [facility]   CORONARY STENT PLACEMENT     FEMUR FRACTURE SURGERY Left 1995   HERNIA REPAIR     as infant groin   Left Hip Surgery Left 02/28/2024   NECK SURGERY  2005   NEPHRECTOMY Left 1997   secondary to cancer   PERIPHERAL VASCULAR BALLOON ANGIOPLASTY  10/19/2023   Procedure: PERIPHERAL VASCULAR BALLOON ANGIOPLASTY;  Surgeon: Court Dorn PARAS, MD;  Location: [facility];  Service: Cardiovascular;;   TOTAL KNEE ARTHROPLASTY Left 2005   TOTAL KNEE ARTHROPLASTY Right 04/13/2018   Procedure: RIGHT TOTAL KNEE ARTHROPLASTY;  Surgeon: Kay Kemps, MD;  Location: [facility] OR;  Service: Orthopedics;  Laterality: Right;   TRANSURETHRAL RESECTION OF PROSTATE N/A 03/25/2021   Procedure: TRANSURETHRAL RESECTION OF THE PROSTATE (TURP);  Surgeon: Matilda Senior, MD;  Location: [facility];  Service: Urology;  Laterality: N/A;    Review of Systems:    All systems reviewed and negative except where noted in HPI.    Physical Exam:  Ht 5' 7 (1.702 m)   Wt 198 lb 4 oz (89.9 kg)   BMI 31.05 kg/m  No LMP for male patient.  General: Well-nourished, well-developed in no acute distress.   Lungs: Clear to auscultation bilaterally. Non-labored. Heart: Regular rate and rhythm, no murmurs rubs or gallops.  Abdomen: Bowel sounds are normal; Abdomen is Soft; No hepatosplenomegaly, masses or hernias;  Moderate LUQ, Left Side, and LLQ Abdominal Tenderness; No right sided abdominal tenderness.  No guarding or rebound tenderness. Neuro: Alert and oriented x 3.  Grossly intact.  Psych: Alert and cooperative,  normal mood and affect.   Imaging Studies: CT Head Wo Contrast Result Date: 07/08/2024 CLINICAL DATA:  Clemens.  Hit head.  Patient is on Plavix  EXAM: CT HEAD WITHOUT CONTRAST CT CERVICAL SPINE WITHOUT CONTRAST TECHNIQUE: Multidetector CT imaging of the head and cervical spine was performed following the standard protocol without intravenous contrast. Multiplanar CT image reconstructions of the cervical spine were also generated. RADIATION DOSE REDUCTION: This exam was performed according to the departmental dose-optimization program which includes automated exposure control, adjustment of the mA and/or kV according to patient size and/or use of iterative reconstruction technique. COMPARISON:  Head CT 06/01/2021 FINDINGS: CT HEAD FINDINGS Brain: Stable cerebral atrophy, ventriculomegaly and periventricular white matter disease. No extra-axial fluid collections are identified. No CT findings for acute hemispheric infarction or intracranial hemorrhage. No mass lesions. The brainstem and cerebellum are normal. Vascular: Stable vascular calcifications. No aneurysm hyperdense vessels. Skull: No skull fracture or bone lesion. Sinuses/Orbits: The paranasal sinuses and mastoid air cells are clear. The globes are intact. Other: No scalp lesions or scalp hematoma. CT CERVICAL SPINE FINDINGS Alignment: Normal Skull base and vertebrae: No acute fracture. No primary bone lesion or focal pathologic process. Surgical changes from prior fusion procedure. Anterior and interbody fusion hardware at C5-6 and C6-7. No  complicating features. Soft tissues and spinal canal: No prevertebral fluid or swelling. No visible canal hematoma. Disc levels: The spinal canal is fairly generous. No significant canal stenosis. There is a wide decompressive laminectomy noted C6-7. The right C5-6 facet is fused and the left facet is mildly widened. Upper chest: The lung apices are grossly clear. Other: No neck mass adenopathy hematoma. Minimal scattered carotid artery calcifications. IMPRESSION: 1. Stable cerebral atrophy, ventriculomegaly and periventricular white matter disease. 2. No acute intracranial findings or skull fracture. 3. Normal alignment of the cervical vertebral bodies and no acute fracture. 4. Surgical changes from prior fusion procedure at C5-6 and C6-7. No complicating features. Electronically Signed   By: MYRTIS Stammer M.D.   On: 07/08/2024 13:36   CT Cervical Spine Wo Contrast Result Date: 07/08/2024 CLINICAL DATA:  Clemens.  Hit head.  Patient is on Plavix  EXAM: CT HEAD WITHOUT CONTRAST CT CERVICAL SPINE WITHOUT CONTRAST TECHNIQUE: Multidetector CT imaging of the head and cervical spine was performed following the standard protocol without intravenous contrast. Multiplanar CT image reconstructions of the cervical spine were also generated. RADIATION DOSE REDUCTION: This exam was performed according to the departmental dose-optimization program which includes automated exposure control, adjustment of the mA and/or kV according to patient size and/or use of iterative reconstruction technique. COMPARISON:  Head CT 06/01/2021 FINDINGS: CT HEAD FINDINGS Brain: Stable cerebral atrophy, ventriculomegaly and periventricular white matter disease. No extra-axial fluid collections are identified. No CT findings for acute hemispheric infarction or intracranial hemorrhage. No mass lesions. The brainstem and cerebellum are normal. Vascular: Stable vascular calcifications. No aneurysm hyperdense vessels. Skull: No skull fracture or bone  lesion. Sinuses/Orbits: The paranasal sinuses and mastoid air cells are clear. The globes are intact. Other: No scalp lesions or scalp hematoma. CT CERVICAL SPINE FINDINGS Alignment: Normal Skull base and vertebrae: No acute fracture. No primary bone lesion or focal pathologic process. Surgical changes from prior fusion procedure. Anterior and interbody fusion hardware at C5-6 and C6-7. No complicating features. Soft tissues and spinal canal: No prevertebral fluid or swelling. No visible canal hematoma. Disc levels: The spinal canal is fairly generous. No significant canal stenosis. There is a wide decompressive laminectomy noted C6-7. The right C5-6 facet  is fused and the left facet is mildly widened. Upper chest: The lung apices are grossly clear. Other: No neck mass adenopathy hematoma. Minimal scattered carotid artery calcifications. IMPRESSION: 1. Stable cerebral atrophy, ventriculomegaly and periventricular white matter disease. 2. No acute intracranial findings or skull fracture. 3. Normal alignment of the cervical vertebral bodies and no acute fracture. 4. Surgical changes from prior fusion procedure at C5-6 and C6-7. No complicating features. Electronically Signed   By: MYRTIS Stammer M.D.   On: 07/08/2024 13:36   CT ABDOMEN PELVIS WO CONTRAST Result Date: 06/28/2024 EXAM: CT ABDOMEN AND PELVIS WITHOUT CONTRAST 06/28/2024 01:52:50 PM TECHNIQUE: CT of the abdomen and pelvis was performed without the administration of intravenous contrast. One cup of oral contrast was given right before the exam with the goal to enhance the duodenum but not obscure the ureters. Multiplanar reformatted images are provided for review. Automated exposure control, iterative reconstruction, and/or weight-based adjustment of the mA/kV was utilized to reduce the radiation dose to as low as reasonably achievable. COMPARISON: CTA chest 06/05/2024 and previous study 02/20/2020. CLINICAL HISTORY: Left flank pain and LUQ pain x 1  week. FINDINGS: LOWER CHEST: Scattered coronary calcifications. 3 mm nodule in the lateral basal segment left lower lobe (4:14) stable since 02/20/2020 consistent with benign lesion. According to the Fleischner Society pulmonary nodule recommendations, the finding is consistent with a single solid nodule <6 mm in a stable nodule, and the recommendation is no routine follow-up required (for low-risk patients). LIVER: Coarse calcifications peripherally in the hepatic segments 3 and 5, stable since 02/20/2020. GALLBLADDER AND BILE DUCTS: Gallbladder is unremarkable. No biliary ductal dilatation. SPLEEN: No acute abnormality. PANCREAS: No acute abnormality. ADRENAL GLANDS: No acute abnormality. KIDNEYS, URETERS AND BLADDER: 2.6 cm medial cortical cyst in the mid right kidney, present since 02/20/2020. 4.1 cm peripheral cortical cyst from the right lower pole. Remote left nephrectomy. No stones in the kidneys or ureters. No hydronephrosis. No perinephric or periureteral stranding. Urinary bladder is unremarkable. GI AND BOWEL: The stomach is distended by indigestion material. Normal appendix. Multiple descending and sigmoid diverticula. Inflammatory changes around the descending/sigmoid junction without abscess. There is no bowel obstruction. PERITONEUM AND RETROPERITONEUM: No ascites. No free air. VASCULATURE: Aorta is normal in caliber. LYMPH NODES: No lymphadenopathy. REPRODUCTIVE ORGANS: No acute abnormality. BONES AND SOFT TISSUES: Post left hip arthroplasty. Moderate left inguinal hernia containing only fat. Degenerative disc disease L5 to S1. Implanted stimulator catheter leads bilateral to the superior articular processes of L4. No acute osseous abnormality. IMPRESSION: 1. Inflammatory changes at the descendingsigmoid junction without abscess, consistent with uncomplicated diverticulitis. 2. Additional non-urgent findings as above. Electronically signed by: Katheleen Faes MD 06/28/2024 02:18 PM EDT RP  Workstation: [id number]    Labs: CBC    Component Value Date/Time   WBC 11.9 (H) 07/08/2024 1722   RBC 5.43 07/08/2024 1722   HGB 14.1 07/08/2024 1722   HGB 13.8 10/03/2023 1645   HCT 44.5 07/08/2024 1722   HCT 44.7 10/03/2023 1645   PLT 345 07/08/2024 1722   PLT 393 10/03/2023 1645   MCV 82.0 07/08/2024 1722   MCV 83 10/03/2023 1645   MCH 26.0 07/08/2024 1722   MCHC 31.7 07/08/2024 1722   RDW 16.6 (H) 07/08/2024 1722   RDW 14.6 10/03/2023 1645   LYMPHSABS 2.4 07/08/2024 1722   LYMPHSABS 2.3 10/03/2023 1645   MONOABS 1.2 (H) 07/08/2024 1722   EOSABS 0.2 07/08/2024 1722   EOSABS 0.3 10/03/2023 1645   BASOSABS 0.1 07/08/2024 1722  BASOSABS 0.1 10/03/2023 1645    CMP     Component Value Date/Time   NA 136 07/08/2024 1722   NA 140 10/03/2023 1645   K 4.5 07/08/2024 1722   CL 101 07/08/2024 1722   CO2 24 07/08/2024 1722   GLUCOSE 92 07/08/2024 1722   BUN 26 (H) 07/08/2024 1722   BUN 23 10/03/2023 1645   CREATININE 1.31 (H) 07/08/2024 1722   CREATININE 1.41 (H) 06/25/2013 1051   CALCIUM  9.5 07/08/2024 1722   PROT 8.1 07/08/2024 1722   PROT 7.5 07/11/2023 0903   ALBUMIN 4.4 07/08/2024 1722   ALBUMIN 4.2 07/11/2023 0903   AST 18 07/08/2024 1722   ALT 19 07/08/2024 1722   ALKPHOS 89 07/08/2024 1722   BILITOT 0.3 07/08/2024 1722   BILITOT 0.3 07/11/2023 0903   GFRNONAA 57 (L) 07/08/2024 1722   GFRNONAA 59.0 01/01/2024 1238   GFRAA 56 (L) 10/28/2020 1127       Assessment and Plan:   Jared [name] is a 73 y.o. y/o male presents for evaluation of:  LUQ, Left Side, LLQ abdominal pain.  Abd / pelvic CT 06/28/24 showed diverticulitis in the descending and sigmoid colon.  His Left side abdominal pain have NOT improved on 14 days of antibiotics (Cipro  / Flagyl  / Clindamycin ).  Plan: - Stat Labs: CBC, CMP, Lipase - CT abd / pelvis with contrast - If CT shows persistent diverticulitis, then he will likely need hospital admission and IV antibiotic  treatment.  Assessment & Plan Diverticulitis of descending and sigmoid colon Acute diverticulitis confirmed by CT. Persistent severe pain despite antibiotics. Concern for abscess or perforation. - Order repeat CT scan of abdomen and pelvis stat. - Order blood work to check white blood cell count. - Admit to hospital for IV antibiotics if severe infection is confirmed.  Herpes zoster of right shoulder region Recent onset of herpes zoster with severe pain and rash. - Continue antiviral medication, lidocaine  spray, lidocaine  patches, and heating pad.  History of adenomatous colon polyps, status post polypectomy Six adenomatous polyps removed in September 2020. Follow-up colonoscopy due 05/2024, but postponed due to diverticulitis. - Postpone scheduling of repeat colonoscopy until diverticulitis is resolved.      Ellouise Console, PA-C  Follow up will be based on CT and lab results.  He will need repeat surveillance colonoscopy 6 to 8 weeks after diverticulitis has resolved.

## 2024-07-15 NOTE — ED Triage Notes (Signed)
 Pt sent from radiology r/t liquid contrast reaction. Hives noted to BUE, back. Pt states he is itchy in these areas. Denies HA, nausea, throat swelling/ SHOB. CT for severe pain in LUQ, they already told me it was diverticulitis

## 2024-07-15 NOTE — Telephone Encounter (Signed)
 Patient returning phone call. Please advise, thank you

## 2024-07-15 NOTE — ED Notes (Signed)
 Patient transported to CT

## 2024-07-15 NOTE — Telephone Encounter (Signed)
 Inbound call from [facility] radiology department stating that this patient has a CT today and we need to do a Prior auth for this patient. Annah is requesting to call back to give further information on what she is needing. A good call back number is [phone] ext : [phone]. Please advise.

## 2024-07-15 NOTE — Telephone Encounter (Signed)
 Patient returned call from Friday.  Still taking meds for diverticular flare and is having different pain from what he has experienced  before. Made appt for day @ 10:20 T. [place], PA-C.

## 2024-07-15 NOTE — Plan of Care (Signed)
 Plan of Care Note for accepted transfer   Patient name: Jared [name] FMW:[id number] DOB: 07-17-1951  Facility requesting transfer: [facility] Requesting Provider: [name], PA  Reason for transfer: Acute diverticulitis Facility course: 73 year old male with history of CAD, renal cell carcinoma, hypothyroidism, asthma, parkinsonism, hypertension, esophageal stricture, diverticulitis, GERD presenting with left-sided abdominal pain.  Patient had an outpatient CT abdomen pelvis done on October 10 which showed diverticulitis of the descending and sigmoid colon.  He has been on a course of antibiotics including clindamycin , ciprofloxacin , and metronidazole  since then.  Despite antibiotic treatment, still having constant left-sided abdominal pain.  Vital signs stable.  WBC count 11.6.    Repeat CT abdomen pelvis without contrast done today showing: IMPRESSION: 1. Acute diverticulitis of the mid sigmoid colon in a similar location to prior, without perforation or abscess; small reactive pericolonic lymph nodes. 2. Sigmoid diverticulosis. 3. Right renal simple-appearing cysts ; no follow-up imaging recommended per incidental renal cyst guidelines. 4. Stable 3 mm solid left lower lobe pulmonary nodule; no routine follow-up imaging recommended as per Fleischner Society Guidelines (in absence of cancer history or immunocompromise).  Patient was given IV antibiotics, Benadryl , morphine , Zofran , and IV Pepcid .  Plan of care: The patient is accepted for admission to Telemetry unit at [facility].  [facility] will assume care on arrival to accepting facility. Until arrival, care as per EDP. However, [facility] available 24/7 for questions and assistance.  Check [email or web address] for on-call coverage.  Nursing staff, please call [facility] Admits & Consults System-Wide number under Amion on patient's arrival so appropriate admitting provider can evaluate the pt.

## 2024-07-15 NOTE — Progress Notes (Signed)
 Agree with assessment and plan as outlined.  He warrants repeat imaging to see if persistent diverticulitis despite outpatient antibiotics or any complications from diverticulitis that have occurred.  Will await stat CT scan and labs.

## 2024-07-15 NOTE — ED Provider Notes (Signed)
 [place] EMERGENCY DEPARTMENT AT [facility] Provider Note   CSN: [id number] Arrival date & time: 07/15/24  1737     Patient presents with: Allergic Reaction and Abdominal Pain   Jared [name] is a 73 y.o. male.    Allergic Reaction Abdominal Pain  Jared [name] is a 73 year old male with diverticulitis who presents with severe left sided abdominal pain.  He has a history of diverticulitis, GERD, MI, renal cell carcinoma, hypothyroidism, asthma, parkinsonism, CAD, hypertension, esophageal stricture   He has a history of diverticulitis and is currently experiencing severe left-sided abdominal pain. He reports that a CT scan of the abdomen and pelvis was performed on June 28, 2024 (ordered by his PCP), and he was told it showed diverticulitis in the descending and sigmoid colon. He has been on a course of antibiotics including clindamycin , ciprofloxacin , and metronidazole  since the CT scan x 14 days. Despite this treatment, the pain, located in the left upper abdomen, left side, and left lower abdomen, has been constant and severe, reaching a 10 out of 10 on the pain scale over the past two to three days. The pain initially started lower in the abdomen but has since moved to the left upper quadrant. He has been taking hydrocodone  for pain, but it does not alleviate the pain. No fever, chills, nausea, vomiting, constipation, or blood in stool. Bowel movements are daily and formed.       Prior to Admission medications   Medication Sig Start Date End Date Taking? Authorizing Provider  albuterol  (PROVENTIL ) (2.5 MG/3ML) 0.083% nebulizer solution Take 3 mLs (2.5 mg total) by nebulization every 6 (six) hours as needed for wheezing or shortness of breath. 12/20/22   Kozlow, Camellia JINNY, MD  albuterol  (VENTOLIN  HFA) 108 (90 Base) MCG/ACT inhaler Inhale 2 puffs into the lungs every 4 (four) hours as needed for wheezing or shortness of breath. Can inhale two puffs every four to six  hours as needed for cough, wheeze, shortness of breath, or chest tightness. 02/14/23   Jeneal Danita Macintosh, MD  aspirin  EC 81 MG tablet Take 1 tablet (81 mg total) by mouth daily. Swallow whole. 10/21/23   Henry Manuelita NOVAK, NP  carbidopa-levodopa (SINEMET IR) 25-100 MG tablet Take 1 tablet by mouth 3 (three) times daily.    [provider]  ciprofloxacin  (CIPRO ) 500 MG tablet Take 500 mg by mouth. 06/28/24   [provider]  clindamycin  (CLEOCIN ) 300 MG capsule TAKE 2 CAPSULES BY MOUTH 1 HOUR BEFORE DENTAL WORK    [provider]  clopidogrel  (PLAVIX ) 75 MG tablet Take 1 tablet (75 mg total) by mouth daily with breakfast. 10/21/23   Henry Manuelita B, NP  Coenzyme Q10 (CO Q-10 PO) Take 1 capsule by mouth daily.    [provider]  DULoxetine  (CYMBALTA ) 60 MG capsule Take 60 mg by mouth every morning. 04/22/21   [provider]  EPINEPHrine  0.3 mg/0.3 mL IJ SOAJ injection Inject 0.3 mg into the muscle as needed for anaphylaxis. 06/12/23   [provider]  finasteride  (PROSCAR ) 5 MG tablet Take 5 mg by mouth daily.    [provider]  HYDROcodone -acetaminophen  (NORCO/VICODIN) 5-325 MG tablet 1 tablet as needed for severe pain Orally twice a day; Duration: 10 days 06/28/24   [provider]  levofloxacin (LEVAQUIN) 500 MG tablet Take 500 mg by mouth daily. 06/07/24   [provider]  levothyroxine  (SYNTHROID ) 75 MCG tablet Take 75 mcg by mouth daily before  breakfast.    [provider]  meloxicam (MOBIC) 15 MG tablet 1 tablet with food for pain Orally Once a day; Duration: 10 days 06/28/24   [provider]  metoprolol  succinate (TOPROL -XL) 25 MG 24 hr tablet TAKE 1 TABLET(25 MG) BY MOUTH DAILY 03/01/24   Court Dorn PARAS, MD  metroNIDAZOLE  (FLAGYL ) 500 MG tablet 1 tablet Orally Three times a day; Duration: 10 days    [provider]  Multiple Vitamin (MULTIVITAMIN) tablet Take 1 tablet by mouth  at bedtime.     [provider]  NUCALA  100 MG/ML SOSY Inject 1 mL into the skin every 30 (thirty) days. 02/14/23   [provider]  OZEMPIC, 1 MG/DOSE, 4 MG/3ML SOPN Inject 1 mg into the skin once a week.    [provider]  pantoprazole  (PROTONIX ) 40 MG tablet TAKE 1 TABLET BY MOUTH DAILY 12/04/23   Court Dorn PARAS, MD  REPATHA  SURECLICK 140 MG/ML SOAJ INJECT 1 PEN INTO THE SKIN EVERY 14 DAYS 11/23/23   Court Dorn PARAS, MD  sildenafil (REVATIO) 20 MG tablet SMARTSIG:3-5 Tablet(s) By Mouth Daily    [provider]  sodium chloride  (OCEAN) 0.65 % SOLN nasal spray Place 1 spray into both nostrils as needed for congestion.    [provider]  Spacer/Aero-Holding Chambers (OPTICHAMBER DIAMOND ) MISC 1 each by Other route daily. Use as directed with inhaler. 12/20/22   Kozlow, Camellia PARAS, MD  tamsulosin  (FLOMAX ) 0.4 MG CAPS capsule Take 0.4 mg by mouth at bedtime.    [provider]  valACYclovir (VALTREX) 1000 MG tablet 1 tablet Orally 3 times a day; Duration: 7 days 07/12/24   [provider]  vitamin C  (ASCORBIC ACID ) 500 MG tablet Take 500 mg by mouth 2 (two) times daily.     [provider]  zinc  gluconate 50 MG tablet Take 50 mg by mouth daily.    [provider]    Allergies: Azithromycin , Nsaids, Penicillins, Iohexol , Misc. sulfonamide containing compounds, Codeine, Penicillamine, Pholcodine, Statins, and Sulfonamide derivatives    Review of Systems  Gastrointestinal:  Positive for abdominal pain.    Updated Vital Signs BP (!) 126/99 (BP Location: Right Arm)   Pulse 83   Temp 97.9 F (36.6 C)   Resp 18   SpO2 100%   Physical Exam Vitals and nursing note reviewed.  Constitutional:      General: He is in acute distress.     Appearance: He is obese. He is not toxic-appearing.  HENT:     Head: Normocephalic and atraumatic.     Nose: Nose normal.  Eyes:     General: No scleral icterus. Cardiovascular:      Rate and Rhythm: Normal rate and regular rhythm.     Pulses: Normal pulses.     Heart sounds: Normal heart sounds.  Pulmonary:     Effort: Pulmonary effort is normal. No respiratory distress.     Breath sounds: No wheezing.  Abdominal:     Palpations: Abdomen is soft.     Tenderness: There is abdominal tenderness in the periumbilical area, suprapubic area, left upper quadrant and left lower quadrant.  Musculoskeletal:     Cervical back: Normal range of motion.     Right lower leg: No edema.     Left lower leg: No edema.  Skin:    General: Skin is warm and dry.     Capillary Refill: Capillary refill takes less than 2 seconds.  Neurological:  Mental Status: He is alert. Mental status is at baseline.  Psychiatric:        Mood and Affect: Mood normal.        Behavior: Behavior normal.     (all labs ordered are listed, but only abnormal results are displayed) Labs Reviewed  LIPASE, BLOOD  COMPREHENSIVE METABOLIC PANEL WITH GFR  CBC  URINALYSIS, ROUTINE W REFLEX MICROSCOPIC    EKG: None  Radiology: No results found.   Procedures   Medications Ordered in the ED  diphenhydrAMINE  (BENADRYL ) capsule 25 mg (has no administration in time range)  morphine  (PF) 4 MG/ML injection 4 mg (has no administration in time range)  ondansetron  (ZOFRAN ) injection 4 mg (has no administration in time range)                                    Medical Decision Making Amount and/or Complexity of Data Reviewed Labs: ordered.  Risk Prescription drug management. Decision regarding hospitalization.   This patient presents to the ED for concern of abd pain, this involves a number of treatment options, and is a complaint that carries with it a moderate to high risk of complications and morbidity. A differential diagnosis was considered for the patient's symptoms which is discussed below:   The causes of generalized abdominal pain include but are not limited to AAA, mesenteric ischemia,  appendicitis, diverticulitis, DKA, gastritis, gastroenteritis, AMI, nephrolithiasis, pancreatitis, peritonitis, adrenal insufficiency,lead poisoning, iron toxicity, intestinal ischemia, constipation, UTI,SBO/LBO, splenic rupture, biliary disease, IBD, IBS, PUD, or hepatitis.   Co morbidities: Discussed in HPI   Brief History:  Jared [name] is a 73 year old male with diverticulitis who presents with severe left sided abdominal pain.  He has a history of diverticulitis, GERD, MI, renal cell carcinoma, hypothyroidism, asthma, parkinsonism, CAD, hypertension, esophageal stricture   He has a history of diverticulitis and is currently experiencing severe left-sided abdominal pain. He reports that a CT scan of the abdomen and pelvis was performed on June 28, 2024 (ordered by his PCP), and he was told it showed diverticulitis in the descending and sigmoid colon. He has been on a course of antibiotics including clindamycin , ciprofloxacin , and metronidazole  since the CT scan x 14 days. Despite this treatment, the pain, located in the left upper abdomen, left side, and left lower abdomen, has been constant and severe, reaching a 10 out of 10 on the pain scale over the past two to three days. The pain initially started lower in the abdomen but has since moved to the left upper quadrant. He has been taking hydrocodone  for pain, but it does not alleviate the pain. No fever, chills, nausea, vomiting, constipation, or blood in stool. Bowel movements are daily and formed.     EMR reviewed including pt PMHx, past surgical history and past visits to ER.   See HPI for more details   Lab Tests:   I ordered and independently interpreted labs. Labs notable for Leukocytosis, no anemia, lipase normal, urinalysis pending  Imaging Studies:  Abnormal findings. I personally reviewed all imaging studies. Imaging notable for  IMPRESSION: 1. Acute diverticulitis of the mid sigmoid colon in a similar location  to prior, without perforation or abscess; small reactive pericolonic lymph nodes. 2. Sigmoid diverticulosis. 3. Right renal simple-appearing cysts ; no follow-up imaging recommended per incidental renal cyst guidelines. 4. Stable 3 mm solid left lower lobe pulmonary nodule; no routine follow-up imaging  recommended as per Fleischner Society Guidelines (in absence of cancer history or immunocompromise).  Cardiac Monitoring:  NA NA   Medicines ordered:  I ordered medication including morphine , Benadryl , Zofran  for pain and nausea, itching Reevaluation of the patient after these medicines showed that the patient improved I have reviewed the patients home medicines and have made adjustments as needed   Critical Interventions:     Consults/Attending Physician   I discussed this case with my attending physician who cosigned this note including patient's presenting symptoms, physical exam, and planned diagnostics and interventions. Attending physician stated agreement with plan or made changes to plan which were implemented.   Discussed with Dr. Alfornia who will admit.   Reevaluation:  After the interventions noted above I re-evaluated patient and found that they have :improved   Social Determinants of Health:      Problem List / ED Course:  Patient is a 73 year old male with now 2 weeks of abdominal pain, loose stool has been on Cipro  Flagyl  and clindamycin  for 2 full weeks.  His symptoms have been worsening.  He came to the emergency room for mild rash following oral contrast.   Dispostion:  After consideration of the diagnostic results and the patients response to treatment, I feel that the patent would benefit from admission.   Final diagnoses:  Diverticulitis    ED Discharge Orders     None          Jared [name], [place] 07/15/24 2139    Jerrol Agent, MD 07/16/24 1445    Jerrol Agent, MD 07/16/24 [phone]

## 2024-07-15 NOTE — Telephone Encounter (Signed)
 Left message for Jared [name] regarding PA information on her voicemail.

## 2024-07-15 NOTE — Patient Instructions (Signed)
 Your provider has requested that you go to the basement level for lab work before leaving today. Press B on the elevator. The lab is located at the first door on the left as you exit the elevator.  You have been scheduled for a CT scan of the abdomen and pelvis at [facility], 1st floor Radiology. You are scheduled on 07/15/2024 at 3:00 pm. You should arrive 15 minutes prior to your appointment time for registration. If you have any questions regarding your exam or if you need to reschedule, you may call [facility] at [phone] between the hours of 8:00 am and 5:00 pm, Monday-Friday.   Please follow up sooner if symptoms increase or worsen  Due to recent changes in healthcare laws, you may see the results of your imaging and laboratory studies on MyChart before your provider has had a chance to review them.  We understand that in some cases there may be results that are confusing or concerning to you. Not all laboratory results come back in the same time frame and the provider may be waiting for multiple results in order to interpret others.  Please give us  48 hours in order for your provider to thoroughly review all the results before contacting the office for clarification of your results.   Thank you for trusting me with your gastrointestinal care!   Ellouise Console, PA-C _______________________________________________________  If your blood pressure at your visit was 140/90 or greater, please contact your primary care physician to follow up on this.  _______________________________________________________  If you are age 9 or older, your body mass index should be between 23-30. Your Body mass index is 31.05 kg/m. If this is out of the aforementioned range listed, please consider follow up with your Primary Care Provider.  If you are age 73 or younger, your body mass index should be between 19-25. Your Body mass index is 31.05 kg/m. If this is out of the aformentioned  range listed, please consider follow up with your Primary Care Provider.   ________________________________________________________  The [place] [facility] providers would like to encourage you to use MYCHART to communicate with providers for non-urgent requests or questions.  Due to long hold times on the telephone, sending your provider a message by [facility] may be a faster and more efficient way to get a response.  Please allow 48 business hours for a response.  Please remember that this is for non-urgent requests.  _______________________________________________________

## 2024-07-16 ENCOUNTER — Ambulatory Visit: Payer: Self-pay | Admitting: Physician Assistant

## 2024-07-16 ENCOUNTER — Encounter (HOSPITAL_COMMUNITY): Payer: Self-pay | Admitting: Internal Medicine

## 2024-07-16 DIAGNOSIS — Z9862 Peripheral vascular angioplasty status: Secondary | ICD-10-CM

## 2024-07-16 DIAGNOSIS — I6529 Occlusion and stenosis of unspecified carotid artery: Secondary | ICD-10-CM | POA: Insufficient documentation

## 2024-07-16 DIAGNOSIS — Z8719 Personal history of other diseases of the digestive system: Secondary | ICD-10-CM

## 2024-07-16 DIAGNOSIS — I10 Essential (primary) hypertension: Secondary | ICD-10-CM | POA: Diagnosis not present

## 2024-07-16 DIAGNOSIS — I951 Orthostatic hypotension: Secondary | ICD-10-CM | POA: Insufficient documentation

## 2024-07-16 DIAGNOSIS — K219 Gastro-esophageal reflux disease without esophagitis: Secondary | ICD-10-CM

## 2024-07-16 DIAGNOSIS — Z8679 Personal history of other diseases of the circulatory system: Secondary | ICD-10-CM

## 2024-07-16 DIAGNOSIS — E039 Hypothyroidism, unspecified: Secondary | ICD-10-CM

## 2024-07-16 DIAGNOSIS — G20C Parkinsonism, unspecified: Secondary | ICD-10-CM

## 2024-07-16 DIAGNOSIS — K5792 Diverticulitis of intestine, part unspecified, without perforation or abscess without bleeding: Secondary | ICD-10-CM

## 2024-07-16 LAB — CBC
HCT: 41.5 % (ref 39.0–52.0)
Hemoglobin: 12.6 g/dL — ABNORMAL LOW (ref 13.0–17.0)
MCH: 25.7 pg — ABNORMAL LOW (ref 26.0–34.0)
MCHC: 30.4 g/dL (ref 30.0–36.0)
MCV: 84.5 fL (ref 80.0–100.0)
Platelets: 313 K/uL (ref 150–400)
RBC: 4.91 MIL/uL (ref 4.22–5.81)
RDW: 16.7 % — ABNORMAL HIGH (ref 11.5–15.5)
WBC: 8.9 K/uL (ref 4.0–10.5)
nRBC: 0 % (ref 0.0–0.2)

## 2024-07-16 LAB — COMPREHENSIVE METABOLIC PANEL WITH GFR
ALT: 12 U/L (ref 0–44)
AST: 24 U/L (ref 15–41)
Albumin: 3.7 g/dL (ref 3.5–5.0)
Alkaline Phosphatase: 73 U/L (ref 38–126)
Anion gap: 9 (ref 5–15)
BUN: 25 mg/dL — ABNORMAL HIGH (ref 8–23)
CO2: 23 mmol/L (ref 22–32)
Calcium: 8.7 mg/dL — ABNORMAL LOW (ref 8.9–10.3)
Chloride: 106 mmol/L (ref 98–111)
Creatinine, Ser: 1.3 mg/dL — ABNORMAL HIGH (ref 0.61–1.24)
GFR, Estimated: 57 mL/min — ABNORMAL LOW (ref >60)
Glucose, Bld: 102 mg/dL — ABNORMAL HIGH (ref 70–99)
Potassium: 4 mmol/L (ref 3.5–5.1)
Sodium: 138 mmol/L (ref 135–145)
Total Bilirubin: 0.2 mg/dL (ref 0.0–1.2)
Total Protein: 7.1 g/dL (ref 6.5–8.1)

## 2024-07-16 MED ORDER — CARBIDOPA-LEVODOPA 25-100 MG PO TABS
1.0000 | ORAL_TABLET | Freq: Three times a day (TID) | ORAL | Status: DC
  Administered 2024-07-16 – 2024-07-18 (×7): 1 via ORAL
  Filled 2024-07-16 (×7): qty 1

## 2024-07-16 MED ORDER — CLOPIDOGREL BISULFATE 75 MG PO TABS
75.0000 mg | ORAL_TABLET | Freq: Every day | ORAL | Status: DC
  Administered 2024-07-16 – 2024-07-18 (×3): 75 mg via ORAL
  Filled 2024-07-16 (×3): qty 1

## 2024-07-16 MED ORDER — LEVOTHYROXINE SODIUM 75 MCG PO TABS
75.0000 ug | ORAL_TABLET | Freq: Every day | ORAL | Status: DC
  Administered 2024-07-16 – 2024-07-18 (×3): 75 ug via ORAL
  Filled 2024-07-16 (×3): qty 1

## 2024-07-16 MED ORDER — POLYETHYLENE GLYCOL 3350 17 G PO PACK
17.0000 g | PACK | Freq: Every day | ORAL | Status: DC
  Administered 2024-07-16 – 2024-07-18 (×3): 17 g via ORAL
  Filled 2024-07-16 (×3): qty 1

## 2024-07-16 MED ORDER — SODIUM CHLORIDE 0.9% FLUSH: 3.0000 mL | INTRAVENOUS | Status: DC | PRN

## 2024-07-16 MED ORDER — SENNOSIDES-DOCUSATE SODIUM 8.6-50 MG PO TABS: 1.0000 | ORAL_TABLET | Freq: Every evening | ORAL | Status: DC | PRN

## 2024-07-16 MED ORDER — ACYCLOVIR 400 MG PO TABS
400.0000 mg | ORAL_TABLET | Freq: Every day | ORAL | Status: DC
  Administered 2024-07-16 – 2024-07-18 (×11): 400 mg via ORAL
  Filled 2024-07-16 (×13): qty 1

## 2024-07-16 MED ORDER — HYDROMORPHONE HCL 1 MG/ML IJ SOLN
0.5000 mg | INTRAMUSCULAR | Status: DC | PRN
  Administered 2024-07-16 – 2024-07-17 (×4): 0.5 mg via INTRAVENOUS
  Filled 2024-07-16 (×4): qty 0.5

## 2024-07-16 MED ORDER — SODIUM CHLORIDE 0.9% FLUSH
3.0000 mL | Freq: Two times a day (BID) | INTRAVENOUS | Status: DC
  Administered 2024-07-16 – 2024-07-17 (×4): 3 mL via INTRAVENOUS

## 2024-07-16 MED ORDER — OXYCODONE HCL 5 MG PO TABS
5.0000 mg | ORAL_TABLET | Freq: Four times a day (QID) | ORAL | Status: DC | PRN
  Administered 2024-07-16 – 2024-07-17 (×2): 5 mg via ORAL
  Filled 2024-07-16 (×2): qty 1

## 2024-07-16 MED ORDER — ASPIRIN 81 MG PO TBEC
81.0000 mg | DELAYED_RELEASE_TABLET | Freq: Every day | ORAL | Status: DC
  Administered 2024-07-16 – 2024-07-18 (×3): 81 mg via ORAL
  Filled 2024-07-16 (×3): qty 1

## 2024-07-16 MED ORDER — DIPHENHYDRAMINE HCL 50 MG/ML IJ SOLN
25.0000 mg | Freq: Four times a day (QID) | INTRAMUSCULAR | Status: DC | PRN
  Administered 2024-07-16 – 2024-07-18 (×4): 25 mg via INTRAVENOUS
  Filled 2024-07-16 (×4): qty 1

## 2024-07-16 MED ORDER — SODIUM CHLORIDE 0.9 % IV SOLN: 250.0000 mL | INTRAVENOUS | Status: DC | PRN

## 2024-07-16 MED ORDER — METRONIDAZOLE 500 MG/100ML IV SOLN
500.0000 mg | Freq: Two times a day (BID) | INTRAVENOUS | Status: DC
  Administered 2024-07-16 – 2024-07-18 (×5): 500 mg via INTRAVENOUS
  Filled 2024-07-16 (×5): qty 100

## 2024-07-16 MED ORDER — PANTOPRAZOLE SODIUM 40 MG PO TBEC
40.0000 mg | DELAYED_RELEASE_TABLET | Freq: Every day | ORAL | Status: DC
  Administered 2024-07-16 – 2024-07-18 (×3): 40 mg via ORAL
  Filled 2024-07-16 (×3): qty 1

## 2024-07-16 MED ORDER — METOPROLOL SUCCINATE ER 25 MG PO TB24
12.5000 mg | ORAL_TABLET | Freq: Every day | ORAL | Status: DC
  Administered 2024-07-16 – 2024-07-18 (×3): 12.5 mg via ORAL
  Filled 2024-07-16 (×3): qty 1

## 2024-07-16 MED ORDER — ACETAMINOPHEN 325 MG PO TABS
650.0000 mg | ORAL_TABLET | Freq: Four times a day (QID) | ORAL | Status: DC | PRN
Start: 2024-07-16 — End: 2024-07-18
  Administered 2024-07-17: 650 mg via ORAL
  Filled 2024-07-16: qty 2

## 2024-07-16 MED ORDER — GUAIFENESIN 100 MG/5ML PO LIQD
10.0000 mL | Freq: Four times a day (QID) | ORAL | Status: DC | PRN
Start: 2024-07-16 — End: 2024-07-18
  Administered 2024-07-17: 10 mL via ORAL
  Filled 2024-07-16: qty 10

## 2024-07-16 MED ORDER — ALBUTEROL SULFATE (2.5 MG/3ML) 0.083% IN NEBU: 3.0000 mL | INHALATION_SOLUTION | RESPIRATORY_TRACT | Status: DC | PRN

## 2024-07-16 MED ORDER — ENOXAPARIN SODIUM 40 MG/0.4ML IJ SOSY
40.0000 mg | PREFILLED_SYRINGE | INTRAMUSCULAR | Status: DC
  Administered 2024-07-16 – 2024-07-18 (×3): 40 mg via SUBCUTANEOUS
  Filled 2024-07-16 (×4): qty 0.4

## 2024-07-16 MED ORDER — SODIUM CHLORIDE 0.9% FLUSH
3.0000 mL | Freq: Two times a day (BID) | INTRAVENOUS | Status: DC
  Administered 2024-07-16 – 2024-07-17 (×3): 3 mL via INTRAVENOUS

## 2024-07-16 MED ORDER — CEFTRIAXONE SODIUM 2 G IJ SOLR
2.0000 g | INTRAMUSCULAR | Status: DC
  Administered 2024-07-16 – 2024-07-18 (×3): 2 g via INTRAVENOUS
  Filled 2024-07-16 (×3): qty 20

## 2024-07-16 MED ORDER — CALAMINE EX LOTN: 1.0000 | TOPICAL_LOTION | CUTANEOUS | Status: DC | PRN

## 2024-07-16 MED ORDER — ACETAMINOPHEN 650 MG RE SUPP: 650.0000 mg | Freq: Four times a day (QID) | RECTAL | Status: DC | PRN

## 2024-07-16 NOTE — Progress Notes (Signed)
 Call and notify patient abdominal pelvic CT shows: 1.  Acute diverticulitis of the mid sigmoid colon.  There is no evidence of perforation or abscess.  2.  There is a large amount of stool throughout the colon, consistent with constipation.  I recommend patient start taking MiraLAX  powder, mix 1 or 2 capfuls in a drink daily. 3.  Benign right kidney cyst.  Not worrisome. 4.  Stable 3 mm left lower lobe lung nodule which has not changed.  No follow-up imaging recommended.  **I recommend tell patient to go to [facility] or [facility] ED for further evaluation for hospital admission and IV antibiotics of persistent diverticulitis.  Patient took 14-day treatment with Cipro , Flagyl , and clindamycin  starting 06/28/2024. That did not cleared up his diverticulitis. He is allergic to penicillin, therefore he cannot take Augmentin.   Of note, patient had hives when he received Contrast with the CT last night.  Was treated at [facility]. Ellouise Console, PA-C  CC: Dr. Leigh for FYI.

## 2024-07-16 NOTE — Progress Notes (Signed)
 Patient seen and examined.  Admitted early morning hours by nighttime hospitalist.  On my exam patient has mild pain on the left lower abdomen that has mostly improved.  He denies any nausea vomiting.  Tolerating soft food.  Afebrile.  Patient tells me that he has been on different antibiotics for the last 15 days, he was on some antibiotics for pneumonia before that.  He was sent to the ER because he developed hives after getting IV contrast.  Patient also has developed painful rash on his right back.   Acute uncomplicated diverticulitis: Failed outpatient antibiotic therapy.  Given failed therapy, continue Rocephin and Flagyl  today.  Continue to advance diet gradually.  Will monitor on IV antibiotics today, if adequate improvement likely home tomorrow on oral antibiotics.  CT scan and repeated CT scan without complications.  He has well-established GI follow-up. Significant constipation, will start MiraLAX . No clinical evidence of C. difficile infection, discontinue enteric precautions.   Shingles rash: Patient does have pinpoint blistering rash along the right posterior chest wall in a dermatomal distribution as in the picture below.  Consistent with shingles rash.  Since they are active, will need airborne precautions and also start on acyclovir for 7 days.      Total time spent: 35 minutes.  Same-day admit.  No charge visit.

## 2024-07-16 NOTE — Plan of Care (Signed)

## 2024-07-16 NOTE — H&P (Signed)
 History and Physical    Jared [name] FMW:[id number] DOB: 1950-12-01 DOA: 07/15/2024  PCP: Shepard Ade, MD   Patient coming from: Home   Chief Complaint:  Chief Complaint  Patient presents with   Allergic Reaction   Abdominal Pain   ED TRIAGE note:Pt sent from radiology r/t liquid contrast reaction. Hives noted to BUE, back. Pt states he is itchy in these areas. Denies HA, nausea, throat swelling/ SHOB. CT for severe pain in LUQ, they already told me it was diverticulitis  HPI:  Jared [name] is a 73 y.o. male with medical history significant of chronic orthostatic hypotension (currently on Imdur  and Toprol -XL 25 mg), CAD, carotid Artery Stenosis, Peripheral Artery Disease Status Post Angioplasty, renal cell carcinoma, hypothyroidism, asthma, parkinsonism, hypertension, esophageal stricture, and GERD presenting with left-sided abdominal pain.  Patient had an outpatient CT abdomen pelvis done on October 10 which showed diverticulitis of the descending and sigmoid colon.  He has been on a course of antibiotics including clindamycin , ciprofloxacin , and metronidazole  14 days.  Despite antibiotic treatment, still having constant left-sided abdominal pain.  Patient is endorsing left-sided abdominal pain.  Denies any fever, chill, nausea, vomiting, diarrhea, chest pain or shortness of breath.  Denies any headache and dizziness.  No other complaints at this time.   At presentation to ED patient is hemodynamically stable.  Afebrile.  Vital signs stable.  Lab, WBC count 11.6.  CMP showing low sodium 132 otherwise unremarkable.   Repeat CT abdomen pelvis without contrast done today showing: IMPRESSION: 1. Acute diverticulitis of the mid sigmoid colon in a similar location to prior, without perforation or abscess; small reactive pericolonic lymph nodes. 2. Sigmoid diverticulosis. 3. Right renal simple-appearing cysts ; no follow-up imaging recommended per incidental renal cyst  guidelines. 4. Stable 3 mm solid left lower lobe pulmonary nodule; no routine follow-up imaging recommended as per Fleischner Society Guidelines (in absence of cancer history or immunocompromise).   Patient was given IV antibiotics, Benadryl , morphine , Zofran , and IV Pepcid .   Patient has been transferred to [facility] for further management of acute diverticulitis of the sigmoid colon.   Significant labs in the ED: Lab Orders         Culture, blood (Routine X 2) w Reflex to ID Panel         Gastrointestinal Panel by PCR , Stool         C Difficile Quick Screen w PCR reflex         Lipase, blood         Comprehensive metabolic panel         CBC         Urinalysis, Routine w reflex microscopic -Urine, Clean Catch         Comprehensive metabolic panel         CBC       Review of Systems:  Review of Systems  Constitutional:  Negative for chills, fever, malaise/fatigue and weight loss.  Respiratory:  Negative for cough and sputum production.   Cardiovascular:  Negative for chest pain and palpitations.  Gastrointestinal:  Positive for abdominal pain. Negative for blood in stool, constipation, diarrhea, heartburn, melena, nausea and vomiting.       Left-sided abdominal pain.  Genitourinary:  Negative for dysuria.  Musculoskeletal:  Negative for myalgias.  Neurological:  Negative for dizziness and headaches.  Psychiatric/Behavioral:  The patient is not nervous/anxious.     Past Medical History:  Diagnosis Date   Anxiety  Asthma    Cervical vertebral fusion 05/02/2014   Now presenting with C5-6 radiculopathy   Coronary artery disease    COVID 01/2020   pain all over monoclonal antibodies given all symptoms resolved   GERD (gastroesophageal reflux disease)    H/O heart artery stent 2000   to right rca   Heart attack (HCC) 2009   History of depression 03/19/2021   History of esophageal stricture    History of spinal fracture 2015   Hyperlipidemia     Hypertension    Hypothyroidism    Neuroleptic induced parkinsonism    left arm tremor   Osteoarthritis    lower back   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   Renal cell carcinoma 1997   Left kidney   Sleep apnea    wears CPAP   Sleep talking    Snoring 12/25/2013   Wears glasses     Past Surgical History:  Procedure Laterality Date   ABDOMINAL AORTOGRAM W/LOWER EXTREMITY N/A 10/19/2023   Procedure: ABDOMINAL AORTOGRAM W/LOWER EXTREMITY;  Surgeon: Court Dorn PARAS, MD;  Location: [facility] IN[facility];  Service: Cardiovascular;  Laterality: N/A;   ANTERIOR CERVICAL DECOMP/DISCECTOMY FUSION N/A 08/27/2014   Procedure: Cervical six-seven anterior cervical decompression fusion with removal of hardware at Cervical five-six;  Surgeon: Fairy Levels, MD;  Location: [facility] NEURO ORS;  Service: Neurosurgery;  Laterality: N/A;  Cervical six-seven anterior cervical decompression fusion with removal of hardware at Cervical five-six   BACK SURGERY     1992, 2013 lower back   CARDIAC CATHETERIZATION  05/10/2007   Minimal CAD, normal LV systolic function, medical management   CARDIAC CATHETERIZATION  04/08/2008   RCA ulcerated 70-80% stenosis, stented with a 3x76mm Endeavor stent at 13atm for 50sec, reduced from 80% ulcerated stenosis to 0%.   CARDIAC CATHETERIZATION  05/07/2009   50% distal left main disease-IVUS or flow wire too dangerous in particular setting to perform intervention.   CARDIAC CATHETERIZATION  03/18/2010   Medical management   CARDIOVASCULAR STRESS TEST  02/09/2012   Normal, no significant wall abnormalities noted   CARPAL TUNNEL RELEASE Bilateral 2000   COLONOSCOPY     COLONOSCOPY W/ BIOPSIES AND POLYPECTOMY     COLONOSCOPY WITH PROPOFOL   06/01/2021   [facility]   CORONARY STENT PLACEMENT     FEMUR FRACTURE SURGERY Left 1995   HERNIA REPAIR     as infant groin   Left Hip Surgery Left 02/28/2024   NECK SURGERY  2005   NEPHRECTOMY Left 1997    secondary to cancer   PERIPHERAL VASCULAR BALLOON ANGIOPLASTY  10/19/2023   Procedure: PERIPHERAL VASCULAR BALLOON ANGIOPLASTY;  Surgeon: Court Dorn PARAS, MD;  Location: [facility];  Service: Cardiovascular;;   TOTAL KNEE ARTHROPLASTY Left 2005   TOTAL KNEE ARTHROPLASTY Right 04/13/2018   Procedure: RIGHT TOTAL KNEE ARTHROPLASTY;  Surgeon: Kay Kemps, MD;  Location: [facility] OR;  Service: Orthopedics;  Laterality: Right;   TRANSURETHRAL RESECTION OF PROSTATE N/A 03/25/2021   Procedure: TRANSURETHRAL RESECTION OF THE PROSTATE (TURP);  Surgeon: Matilda Senior, MD;  Location: [facility];  Service: Urology;  Laterality: N/A;     reports that he has never smoked. He has never used smokeless tobacco. He reports that he does not currently use alcohol . He reports that he does not use drugs.  Allergies  Allergen Reactions   Azithromycin  Anaphylaxis    Ulcers in mouth, nose, and ears   Nsaids Other (See Comments)  Was told not to take d/t pt only having 1 kidney   Penicillins Hives, Itching, Other (See Comments) and Nausea And Vomiting    Tolerates Cefepime   Has patient had a PCN reaction causing immediate rash, facial/tongue/throat swelling, SOB or lightheadedness with hypotension: Unknown  Has patient had a PCN reaction causing severe rash involving mucus membranes or skin necrosis: Unknown  Has patient had a PCN reaction that required hospitalization: Unknown  Has patient had a PCN reaction occurring within the last 10 years: No  If all of the above answers are NO, then may proceed with Cephalosporin use.  Has patient had a PCN reaction causing immediate rash, facial/tongue/throat swelling, SOB or lightheadedness with hypotension: Unknown, Has patient had a PCN reaction causing severe rash involving mucus membranes or skin necrosis: Unknown, Has patient had a PCN reaction that required hospitalization: Unknown, Has patient had a PCN reaction occurring within the  last 10 years: No, If all of the above answers are NO, then may proceed with Cephalosporin use.   Iohexol  Hives and Itching    Per radiologist Dr. Scott - pt needs 13 hr prep. Pt was seen outpatient and given oral iohexol , broke out in hives and itching. Dr. Scott was consulted 07/15/24   Misc. Sulfonamide Containing Compounds Hives   Codeine Nausea Only and Nausea And Vomiting   Penicillamine Rash   Pholcodine Rash   Statins Other (See Comments)    Myalgias   Sulfonamide Derivatives Rash    Family History  Problem Relation Age of Onset   Asthma Mother    Heart disease Mother    Hypertension Mother    Thyroid  disease Mother    Esophageal cancer Father    Barrett's esophagus Father    Bone cancer Father        mets from esophagus   Heart disease Father    Cancer Father    Depression Father    Anxiety disorder Father    Alcoholism Father    Obesity Father    Diabetes Sister    Cancer Sister        Cervical cancer   Colon cancer Maternal Uncle    Heart disease Paternal Grandfather    Heart attack Paternal Grandfather 67   Stomach cancer Neg Hx    Colon polyps Neg Hx    Rectal cancer Neg Hx     Prior to Admission medications   Medication Sig Start Date End Date Taking? Authorizing Provider  albuterol  (PROVENTIL ) (2.5 MG/3ML) 0.083% nebulizer solution Take 3 mLs (2.5 mg total) by nebulization every 6 (six) hours as needed for wheezing or shortness of breath. 12/20/22   Kozlow, Camellia PARAS, MD  albuterol  (VENTOLIN  HFA) 108 (90 Base) MCG/ACT inhaler Inhale 2 puffs into the lungs every 4 (four) hours as needed for wheezing or shortness of breath. Can inhale two puffs every four to six hours as needed for cough, wheeze, shortness of breath, or chest tightness. 02/14/23   Jeneal Danita Macintosh, MD  aspirin  EC 81 MG tablet Take 1 tablet (81 mg total) by mouth daily. Swallow whole. 10/21/23   Henry Manuelita NOVAK, NP  carbidopa-levodopa (SINEMET IR) 25-100 MG tablet Take 1 tablet by  mouth 3 (three) times daily.    [provider]  ciprofloxacin  (CIPRO ) 500 MG tablet Take 500 mg by mouth. 06/28/24   [provider]  clindamycin  (CLEOCIN ) 300 MG capsule TAKE 2 CAPSULES BY MOUTH 1 HOUR BEFORE DENTAL WORK    [provider]  clopidogrel  (PLAVIX )  75 MG tablet Take 1 tablet (75 mg total) by mouth daily with breakfast. 10/21/23   Henry Shaver B, NP  Coenzyme Q10 (CO Q-10 PO) Take 1 capsule by mouth daily.    [provider]  DULoxetine  (CYMBALTA ) 60 MG capsule Take 60 mg by mouth every morning. 04/22/21   [provider]  EPINEPHrine  0.3 mg/0.3 mL IJ SOAJ injection Inject 0.3 mg into the muscle as needed for anaphylaxis. 06/12/23   [provider]  finasteride  (PROSCAR ) 5 MG tablet Take 5 mg by mouth daily.    [provider]  HYDROcodone -acetaminophen  (NORCO/VICODIN) 5-325 MG tablet 1 tablet as needed for severe pain Orally twice a day; Duration: 10 days 06/28/24   [provider]  levofloxacin (LEVAQUIN) 500 MG tablet Take 500 mg by mouth daily. 06/07/24   [provider]  levothyroxine  (SYNTHROID ) 75 MCG tablet Take 75 mcg by mouth daily before breakfast.    [provider]  meloxicam (MOBIC) 15 MG tablet 1 tablet with food for pain Orally Once a day; Duration: 10 days 06/28/24   [provider]  metoprolol  succinate (TOPROL -XL) 25 MG 24 hr tablet TAKE 1 TABLET(25 MG) BY MOUTH DAILY 03/01/24   Court Dorn PARAS, MD  metroNIDAZOLE  (FLAGYL ) 500 MG tablet 1 tablet Orally Three times a day; Duration: 10 days    [provider]  Multiple Vitamin (MULTIVITAMIN) tablet Take 1 tablet by mouth at bedtime.     [provider]  NUCALA  100 MG/ML SOSY Inject 1 mL into the skin every 30 (thirty) days. 02/14/23   [provider]  OZEMPIC, 1 MG/DOSE, 4 MG/3ML SOPN Inject 1 mg into the skin once a week.    [provider]  pantoprazole  (PROTONIX ) 40 MG tablet TAKE 1  TABLET BY MOUTH DAILY 12/04/23   Court Dorn PARAS, MD  REPATHA  SURECLICK 140 MG/ML SOAJ INJECT 1 PEN INTO THE SKIN EVERY 14 DAYS 11/23/23   Court Dorn PARAS, MD  sildenafil (REVATIO) 20 MG tablet SMARTSIG:3-5 Tablet(s) By Mouth Daily    [provider]  sodium chloride  (OCEAN) 0.65 % SOLN nasal spray Place 1 spray into both nostrils as needed for congestion.    [provider]  Spacer/Aero-Holding Chambers [facility] DIAMOND ) MISC 1 each by Other route daily. Use as directed with inhaler. 12/20/22   Kozlow, Camellia PARAS, MD  tamsulosin  (FLOMAX ) 0.4 MG CAPS capsule Take 0.4 mg by mouth at bedtime.    [provider]  valACYclovir (VALTREX) 1000 MG tablet 1 tablet Orally 3 times a day; Duration: 7 days 07/12/24   [provider]  vitamin C  (ASCORBIC ACID ) 500 MG tablet Take 500 mg by mouth 2 (two) times daily.     [provider]  zinc  gluconate 50 MG tablet Take 50 mg by mouth daily.    [provider]     Physical Exam: Vitals:   07/15/24 2230 07/15/24 2315 07/16/24 0002 07/16/24 0007  BP: 131/63 115/64 139/76   Pulse: 70  65   Resp: 18 18 17    Temp:   97.8 F (36.6 C)   TempSrc:   Oral   SpO2: 96%  98%   Weight:    89.4 kg  Height:    5' 7 (1.702 m)    Physical Exam Vitals reviewed.  Constitutional:      General: He is not in acute distress.    Appearance: He is not ill-appearing.  Cardiovascular:     Rate and Rhythm: Normal rate  and regular rhythm.     Heart sounds: Normal heart sounds.  Abdominal:     General: Abdomen is flat. Bowel sounds are normal.     Palpations: Abdomen is soft.     Tenderness: There is no abdominal tenderness. There is no guarding or rebound.  Genitourinary:    Prostate: Normal.  Skin:    Capillary Refill: Capillary refill takes less than 2 seconds.  Neurological:     Mental Status: He is alert and oriented to person, place, and time.      Labs on Admission: I have personally reviewed following  labs and imaging studies  CBC: Recent Labs  Lab 07/15/24 1140 07/15/24 1832  WBC 10.2 11.6*  NEUTROABS 7.1  --   HGB 13.3 13.3  HCT 42.0 42.1  MCV 80.4 81.4  PLT 323.0 313   Basic Metabolic Panel: Recent Labs  Lab 07/15/24 1140 07/15/24 1832  NA 136 132*  K 4.3 3.9  CL 103 98  CO2 25 23  GLUCOSE 119* 95  BUN 27* 26*  CREATININE 1.35 1.41*  CALCIUM  9.1 9.3   GFR: Estimated Creatinine Clearance: 48.3 mL/min (A) (by C-G formula based on SCr of 1.41 mg/dL (H)). Liver Function Tests: Recent Labs  Lab 07/15/24 1140 07/15/24 1832  AST 12 17  ALT 5 <5  ALKPHOS 66 81  BILITOT 0.3 0.3  PROT 7.9 7.9  ALBUMIN 4.2 4.3   Recent Labs  Lab 07/15/24 1140 07/15/24 1832  LIPASE 13.0 16   No results for input(s): AMMONIA in the last 168 hours. Coagulation Profile: No results for input(s): INR, PROTIME in the last 168 hours. Cardiac Enzymes: No results for input(s): CKTOTAL, CKMB, CKMBINDEX, TROPONINI, TROPONINIHS in the last 168 hours. BNP (last 3 results) Recent Labs    06/06/24 1634  BNP 44.8   HbA1C: No results for input(s): HGBA1C in the last 72 hours. CBG: No results for input(s): GLUCAP in the last 168 hours. Lipid Profile: No results for input(s): CHOL, HDL, LDLCALC, TRIG, CHOLHDL, LDLDIRECT in the last 72 hours. Thyroid  Function Tests: No results for input(s): TSH, T4TOTAL, FREET4, T3FREE, THYROIDAB in the last 72 hours. Anemia Panel: No results for input(s): VITAMINB12, FOLATE, FERRITIN, TIBC, IRON, RETICCTPCT in the last 72 hours. Urine analysis:    Component Value Date/Time   COLORURINE COLORLESS (A) 07/15/2024 2118   APPEARANCEUR CLEAR 07/15/2024 2118   LABSPEC 1.006 07/15/2024 2118   PHURINE 6.0 07/15/2024 2118   GLUCOSEU NEGATIVE 07/15/2024 2118   HGBUR NEGATIVE 07/15/2024 2118   BILIRUBINUR NEGATIVE 07/15/2024 2118   KETONESUR NEGATIVE 07/15/2024 2118   PROTEINUR NEGATIVE 07/15/2024 2118    UROBILINOGEN 0.2 03/17/2010 1452   NITRITE NEGATIVE 07/15/2024 2118   LEUKOCYTESUR NEGATIVE 07/15/2024 2118    Radiological Exams on Admission: I have personally reviewed images CT ABDOMEN PELVIS WO CONTRAST Result Date: 07/15/2024 EXAM: CT ABDOMEN AND PELVIS WITHOUT CONTRAST 07/15/2024 06:03:11 PM TECHNIQUE: CT of the abdomen and pelvis was performed without the administration of intravenous contrast. Multiplanar reformatted images are provided for review. Automated exposure control, iterative reconstruction, and/or weight-based adjustment of the mA/kV was utilized to reduce the radiation dose to as low as reasonably achievable. COMPARISON: CT abdomen and pelvis dated 2025. CLINICAL HISTORY: diverticulitis. Pt sent from radiology r/t liquid contrast reaction. FINDINGS: LOWER CHEST: Stable 3 mm left lower lobe nodule. LIVER: The liver is unremarkable. GALLBLADDER AND BILE DUCTS: Gallbladder is unremarkable. No biliary ductal dilatation. SPLEEN: There are calcified granulomas in the spleen. PANCREAS: No acute abnormality.  ADRENAL GLANDS: No acute abnormality. KIDNEYS, URETERS AND BLADDER: Left nephrectomy again seen. There is a cyst in the right kidney measuring 4.2 cm. Additional cyst identified in the right kidney measuring 2.4 cm. Per consensus, no follow-up is needed for simple Bosniak type 1 and 2 renal cysts, unless the patient has a malignancy history or risk factors. No stones in the right kidney or ureters. No hydronephrosis. No perinephric or periureteral stranding. Urinary bladder is unremarkable. GI AND BOWEL: Stomach demonstrates no acute abnormality. There is sigmoid colon diverticulosis. There is wall thickening and inflammation of the mid sigmoid colon compatible with acute diverticulitis. This is in a similar location when compared to the prior study. No perforation or abscess identified. There are small pericolonic lymph nodes at this level. The appendix is normal in size. There is a  large amount of stool throughout the colon. There is no bowel obstruction. PERITONEUM AND RETROPERITONEUM: No ascites. No free air. VASCULATURE: Aorta is normal in caliber with atherosclerotic calcifications. LYMPH NODES: Small pericolonic lymph nodes are present at the level of the mid sigmoid colon. No other significant lymphadenopathy. REPRODUCTIVE ORGANS: Prostate gland is enlarged. BONES AND SOFT TISSUES: Left hip arthroplasty is present. There is a small fat-containing umbilical hernia. There are small fat-containing bilateral inguinal hernias. No acute osseous abnormality. No focal soft tissue abnormality. IMPRESSION: 1. Acute diverticulitis of the mid sigmoid colon in a similar location to prior, without perforation or abscess; small reactive pericolonic lymph nodes. 2. Sigmoid diverticulosis. 3. Right renal simple-appearing cysts ; no follow-up imaging recommended per incidental renal cyst guidelines. 4. Stable 3 mm solid left lower lobe pulmonary nodule; no routine follow-up imaging recommended as per Fleischner Society Guidelines (in absence of cancer history or immunocompromise). Electronically signed by: Greig Pique MD 07/15/2024 07:03 PM EDT RP Workstation: [id number]     Assessment/Plan: Principal Problem:   Acute diverticulitis Active Problems:   Hypothyroidism   GERD (gastroesophageal reflux disease)   Essential hypertension   Parkinsonism (HCC)   Orthostatic hypotension   History of CAD (coronary artery disease)   Carotid artery stenosis   S/P peripheral artery angioplasty   History of esophageal stricture    Assessment and Plan: Acute diverticulitis -Presented emergency department complaining of left lower quadrant abdominal pain with associated nausea.  Patient recently completed 14 days course of oral antibiotics with clindamycin  and ciprofloxacin  metronidazole  however continues to have left lower quadrant abdominal pain. - At presentation to ED patient found  hemodynamically stable.  My leukocytosis. - CT abdomen pelvis showing sigmoid colon diverticulitis without any abscess formation. - In the ED patient received IV ceftriaxone and metronidazole . - Obtaining blood culture, GI panel and C. difficile panel - Pharmacy reported that patient has history of allergic reaction to penicillin. - Continue IV ceftriaxone and metronidazole . - Need to follow-up with culture results for antibiotic guidance.  History of GERD -Continue Protonix   Hypothyroidism -Continue levothyroxine   Essential hypertension -Continue Toprol -XL  Parkinsonism -Continue Sinemet  Chronic orthostatic hypotension History of orthostatic hypotension currently on Toprol -XL continue to monitor for development of any orthostatic hypotension.  History of CAD -Continue Toprol -XL, aspirin  and Plavix .  At home patient is on drip Repatha   History of carotid artery disease History of peripheral artery angioplasty -Continue  aspirin  and Plavix .  At home patient is on Repatha   History of RCC status post right nephrectomy    DVT prophylaxis:  Lovenox , SCD TED hose Code Status:  Full Code Diet: Heart healthy diet Family Communication:   Family was  present at bedside, at the time of interview. Opportunity was given to ask question and all questions were answered satisfactorily.  Disposition Plan: Need to follow-up with blood culture, GI panel and C. difficile panel. Admission status:   Inpatient, Telemetry bed  Severity of Illness: The appropriate patient status for this patient is INPATIENT. Inpatient status is judged to be reasonable and necessary in order to provide the required intensity of service to ensure the patient's safety. The patient's presenting symptoms, physical exam findings, and initial radiographic and laboratory data in the context of their chronic comorbidities is felt to place them at high risk for further clinical deterioration. Furthermore, it is not  anticipated that the patient will be medically stable for discharge from the hospital within 2 midnights of admission.   * I certify that at the point of admission it is my clinical judgment that the patient will require inpatient hospital care spanning beyond 2 midnights from the point of admission due to high intensity of service, high risk for further deterioration and high frequency of surveillance required.DEWAINE    Kylene Zamarron, MD [facility]  How to contact the [facility] Attending or Consulting provider 7A - 7P or covering provider during after hours 7P -7A, for this patient.  Check the care team in [facility] and look for a) attending/consulting [facility] provider listed and b) the [facility] team listed Log into [email or web address] and use [facility] universal password to access. If you do not have the password, please contact the hospital operator. Locate the [facility] provider you are looking for under [facility] and page to a number that you can be directly reached. If you still have difficulty reaching the provider, please page the [facility] (Director on Call) for the Hospitalists listed on amion for assistance.  07/16/2024, 1:10 AM

## 2024-07-17 DIAGNOSIS — Z96653 Presence of artificial knee joint, bilateral: Secondary | ICD-10-CM | POA: Diagnosis present

## 2024-07-17 DIAGNOSIS — I10 Essential (primary) hypertension: Secondary | ICD-10-CM | POA: Diagnosis present

## 2024-07-17 DIAGNOSIS — K5732 Diverticulitis of large intestine without perforation or abscess without bleeding: Secondary | ICD-10-CM | POA: Diagnosis present

## 2024-07-17 DIAGNOSIS — Z7902 Long term (current) use of antithrombotics/antiplatelets: Secondary | ICD-10-CM | POA: Diagnosis not present

## 2024-07-17 DIAGNOSIS — I739 Peripheral vascular disease, unspecified: Secondary | ICD-10-CM | POA: Diagnosis present

## 2024-07-17 DIAGNOSIS — K219 Gastro-esophageal reflux disease without esophagitis: Secondary | ICD-10-CM | POA: Diagnosis present

## 2024-07-17 DIAGNOSIS — K5792 Diverticulitis of intestine, part unspecified, without perforation or abscess without bleeding: Secondary | ICD-10-CM | POA: Diagnosis present

## 2024-07-17 DIAGNOSIS — Z79890 Hormone replacement therapy (postmenopausal): Secondary | ICD-10-CM | POA: Diagnosis not present

## 2024-07-17 DIAGNOSIS — Z7982 Long term (current) use of aspirin: Secondary | ICD-10-CM | POA: Diagnosis not present

## 2024-07-17 DIAGNOSIS — I2510 Atherosclerotic heart disease of native coronary artery without angina pectoris: Secondary | ICD-10-CM | POA: Diagnosis present

## 2024-07-17 DIAGNOSIS — E039 Hypothyroidism, unspecified: Secondary | ICD-10-CM | POA: Diagnosis present

## 2024-07-17 DIAGNOSIS — I951 Orthostatic hypotension: Secondary | ICD-10-CM | POA: Diagnosis present

## 2024-07-17 DIAGNOSIS — Z955 Presence of coronary angioplasty implant and graft: Secondary | ICD-10-CM | POA: Diagnosis not present

## 2024-07-17 DIAGNOSIS — Z79899 Other long term (current) drug therapy: Secondary | ICD-10-CM | POA: Diagnosis not present

## 2024-07-17 DIAGNOSIS — Z905 Acquired absence of kidney: Secondary | ICD-10-CM | POA: Diagnosis not present

## 2024-07-17 DIAGNOSIS — K5900 Constipation, unspecified: Secondary | ICD-10-CM | POA: Diagnosis present

## 2024-07-17 DIAGNOSIS — G20C Parkinsonism, unspecified: Secondary | ICD-10-CM | POA: Diagnosis not present

## 2024-07-17 DIAGNOSIS — J45909 Unspecified asthma, uncomplicated: Secondary | ICD-10-CM | POA: Diagnosis present

## 2024-07-17 DIAGNOSIS — Z833 Family history of diabetes mellitus: Secondary | ICD-10-CM | POA: Diagnosis not present

## 2024-07-17 DIAGNOSIS — R10A2 Flank pain, left side: Secondary | ICD-10-CM | POA: Diagnosis present

## 2024-07-17 DIAGNOSIS — E785 Hyperlipidemia, unspecified: Secondary | ICD-10-CM | POA: Diagnosis present

## 2024-07-17 DIAGNOSIS — G20A1 Parkinson's disease without dyskinesia, without mention of fluctuations: Secondary | ICD-10-CM | POA: Diagnosis present

## 2024-07-17 DIAGNOSIS — K222 Esophageal obstruction: Secondary | ICD-10-CM | POA: Diagnosis present

## 2024-07-17 DIAGNOSIS — I6529 Occlusion and stenosis of unspecified carotid artery: Secondary | ICD-10-CM | POA: Diagnosis present

## 2024-07-17 DIAGNOSIS — Z7985 Long-term (current) use of injectable non-insulin antidiabetic drugs: Secondary | ICD-10-CM | POA: Diagnosis not present

## 2024-07-17 DIAGNOSIS — B029 Zoster without complications: Secondary | ICD-10-CM | POA: Diagnosis present

## 2024-07-17 DIAGNOSIS — Z8249 Family history of ischemic heart disease and other diseases of the circulatory system: Secondary | ICD-10-CM | POA: Diagnosis not present

## 2024-07-17 DIAGNOSIS — I252 Old myocardial infarction: Secondary | ICD-10-CM | POA: Diagnosis not present

## 2024-07-17 DIAGNOSIS — Z8616 Personal history of COVID-19: Secondary | ICD-10-CM | POA: Diagnosis not present

## 2024-07-17 MED ORDER — SENNOSIDES-DOCUSATE SODIUM 8.6-50 MG PO TABS
1.0000 | ORAL_TABLET | Freq: Every day | ORAL | Status: DC
  Administered 2024-07-17: 1 via ORAL
  Filled 2024-07-17: qty 1

## 2024-07-17 MED ORDER — HYDROCODONE-ACETAMINOPHEN 5-325 MG PO TABS
1.0000 | ORAL_TABLET | ORAL | Status: DC | PRN
Start: 2024-07-17 — End: 2024-07-18
  Administered 2024-07-17 (×2): 1 via ORAL
  Filled 2024-07-17 (×3): qty 1

## 2024-07-17 MED ORDER — DIPHENHYDRAMINE HCL 25 MG PO CAPS: 25.0000 mg | ORAL_CAPSULE | Freq: Four times a day (QID) | ORAL | Status: DC | PRN

## 2024-07-17 NOTE — Progress Notes (Signed)
 PROGRESS NOTE    KANDON HOSKING  FMW:[id number] DOB: 1951/01/01 DOA: 07/15/2024 PCP: Shepard Ade, MD    Brief Narrative:  73 year old with history of chronic orthostatic hypotension, coronary artery disease, carotid artery stenosis, peripheral arterial disease, hypothyroidism, Parkinson's and GERD who has been suffering from diverticulitis for at least 2 weeks now on different antibiotics.  He was sent for repeat CT scan due to persistent pain, he developed hives after IV contrast.  Also continued to have pain on the left lower quadrant so sent to the ER.  In the emergency room, hemodynamically stable.  Patient was found to have shingles rashes on his back.  CT scan with uncomplicated left descending colon diverticulitis.  Admitted with IV antibiotics.  Subjective: Patient seen and examined.  Intermittent abdominal pain present.  Denies any nausea vomiting.  Tolerating regular diet.  No bowel movement yet.  Remains afebrile.  Itchiness is less intense.   Assessment & Plan:   Acute uncomplicated diverticulitis: Failed outpatient antibiotic therapy.  Given failed therapy, continue Rocephin and Flagyl  today.  Monitor for oral intake.   Will monitor on IV antibiotics today, if adequate improvement likely home tomorrow on oral antibiotics.  CT scan and repeated CT scan without complications.  He has well-established GI follow-up. Significant constipation, will start MiraLAX  and scheduled Senokot. No clinical evidence of C. difficile infection, discontinue enteric precautions.     Shingles rash: Patient does have vesicular rash along the right posterior chest wall in a dermatomal distribution as in the picture in the chart.  Consistent with shingles rash.  Since they are active, will need airborne precautions while in the hospital and also start on acyclovir for 7 days.  Chronic medical issues including GERD, on PPI Hypothyroidism, on thyroxine Hypertension, on Toprol -XL Parkinson's,  on Sinemet Coronary artery disease, on Toprol -XL, aspirin  and Plavix .      DVT prophylaxis: enoxaparin  (LOVENOX ) injection 40 mg Start: 07/16/24 1000 SCDs Start: 07/16/24 0018 Place TED hose Start: 07/16/24 0018   Code Status: Full code Family Communication: None at the bedside Disposition Plan: Status is: Observation The patient will require care spanning > 2 midnights and should be moved to inpatient because: IV antibiotics     Consultants:  None  Procedures:  None  Antimicrobials:  Rocephin Flagyl  10/28---     Objective: Vitals:   07/16/24 0433 07/16/24 0807 07/16/24 1439 07/16/24 1544  BP: 137/84 131/72 137/71 123/80  Pulse: 65 63 63 64  Resp: 18 18 18 18   Temp: (!) 97.4 F (36.3 C) 97.6 F (36.4 C) 97.8 F (36.6 C) 97.7 F (36.5 C)  TempSrc:  Oral Oral Oral  SpO2: 97% 96% 96% 100%  Weight:      Height:        Intake/Output Summary (Last 24 hours) at 07/17/2024 1023 Last data filed at 07/17/2024 0936 Gross per 24 hour  Intake 731.67 ml  Output --  Net 731.67 ml   Filed Weights   07/16/24 0007  Weight: 89.4 kg    Examination:  General exam: Appears calm and comfortable.  Pleasant interactive. Respiratory system: Clear to auscultation. Respiratory effort normal. Cardiovascular system: S1 & S2 heard, RRR.  Gastrointestinal system: Abdomen is nondistended, soft.  Bowel sound present.  He has mild/moderate tenderness without rigidity or guarding on deep palpation left lower quadrant. Central nervous system: Alert and oriented. No focal neurological deficits. Extremities: Symmetric 5 x 5 power. Skin: No rashes, lesions or ulcers Psychiatry: Judgement and insight appear normal. Mood & affect  appropriate.     Data Reviewed: I have personally reviewed following labs and imaging studies  CBC: Recent Labs  Lab 07/15/24 1140 07/15/24 1832 07/16/24 0541  WBC 10.2 11.6* 8.9  NEUTROABS 7.1  --   --   HGB 13.3 13.3 12.6*  HCT 42.0 42.1 41.5  MCV  80.4 81.4 84.5  PLT 323.0 313 313   Basic Metabolic Panel: Recent Labs  Lab 07/15/24 1140 07/15/24 1832 07/16/24 0541  NA 136 132* 138  K 4.3 3.9 4.0  CL 103 98 106  CO2 25 23 23   GLUCOSE 119* 95 102*  BUN 27* 26* 25*  CREATININE 1.35 1.41* 1.30*  CALCIUM  9.1 9.3 8.7*   GFR: Estimated Creatinine Clearance: 52.4 mL/min (A) (by C-G formula based on SCr of 1.3 mg/dL (H)). Liver Function Tests: Recent Labs  Lab 07/15/24 1140 07/15/24 1832 07/16/24 0541  AST 12 17 24   ALT 5 <5 12  ALKPHOS 66 81 73  BILITOT 0.3 0.3 0.2  PROT 7.9 7.9 7.1  ALBUMIN 4.2 4.3 3.7   Recent Labs  Lab 07/15/24 1140 07/15/24 1832  LIPASE 13.0 16   No results for input(s): AMMONIA in the last 168 hours. Coagulation Profile: No results for input(s): INR, PROTIME in the last 168 hours. Cardiac Enzymes: No results for input(s): CKTOTAL, CKMB, CKMBINDEX, TROPONINI in the last 168 hours. BNP (last 3 results) No results for input(s): PROBNP in the last 8760 hours. HbA1C: No results for input(s): HGBA1C in the last 72 hours. CBG: No results for input(s): GLUCAP in the last 168 hours. Lipid Profile: No results for input(s): CHOL, HDL, LDLCALC, TRIG, CHOLHDL, LDLDIRECT in the last 72 hours. Thyroid  Function Tests: No results for input(s): TSH, T4TOTAL, FREET4, T3FREE, THYROIDAB in the last 72 hours. Anemia Panel: No results for input(s): VITAMINB12, FOLATE, FERRITIN, TIBC, IRON, RETICCTPCT in the last 72 hours. Sepsis Labs: No results for input(s): PROCALCITON, LATICACIDVEN in the last 168 hours.  Recent Results (from the past 240 hours)  Culture, blood (Routine X 2) w Reflex to ID Panel     Status: None (Preliminary result)   Collection Time: 07/16/24  5:41 AM   Specimen: BLOOD LEFT ARM  Result Value Ref Range Status   Specimen Description   Final    BLOOD LEFT ARM Performed at [facility], [address], [place], [place]  [address]    Special Requests   Final    BOTTLES DRAWN AEROBIC ONLY Blood Culture adequate volume Performed at [facility], [address], [place], [place] [address]    Culture   Final    NO GROWTH 1 DAY Performed at [facility], [address] [place], [place] [address]    Report Status PENDING  Incomplete  Culture, blood (Routine X 2) w Reflex to ID Panel     Status: None (Preliminary result)   Collection Time: 07/16/24  5:41 AM   Specimen: BLOOD LEFT ARM  Result Value Ref Range Status   Specimen Description   Final    BLOOD LEFT ARM Performed at [facility], [address], [place], [place] [address]    Special Requests   Final    BOTTLES DRAWN AEROBIC ONLY Blood Culture adequate volume Performed at [facility], [address], [place], [place] [address]    Culture   Final    NO GROWTH 1 DAY Performed at [facility], [address] [place], [place] [address]  Report Status PENDING  Incomplete         Radiology Studies: CT ABDOMEN PELVIS WO CONTRAST Result Date: 07/15/2024 EXAM: CT ABDOMEN AND PELVIS WITHOUT CONTRAST 07/15/2024 06:03:11 PM TECHNIQUE: CT of the abdomen and pelvis was performed without the administration of intravenous contrast. Multiplanar reformatted images are provided for review. Automated exposure control, iterative reconstruction, and/or weight-based adjustment of the mA/kV was utilized to reduce the radiation dose to as low as reasonably achievable. COMPARISON: CT abdomen and pelvis dated 2025. CLINICAL HISTORY: diverticulitis. Pt sent from radiology r/t liquid contrast reaction. FINDINGS: LOWER CHEST: Stable 3 mm left lower lobe nodule. LIVER: The liver is unremarkable. GALLBLADDER AND BILE DUCTS: Gallbladder is unremarkable. No biliary ductal dilatation. SPLEEN: There are calcified granulomas in the spleen. PANCREAS: No acute abnormality. ADRENAL GLANDS: No acute abnormality. KIDNEYS, URETERS  AND BLADDER: Left nephrectomy again seen. There is a cyst in the right kidney measuring 4.2 cm. Additional cyst identified in the right kidney measuring 2.4 cm. Per consensus, no follow-up is needed for simple Bosniak type 1 and 2 renal cysts, unless the patient has a malignancy history or risk factors. No stones in the right kidney or ureters. No hydronephrosis. No perinephric or periureteral stranding. Urinary bladder is unremarkable. GI AND BOWEL: Stomach demonstrates no acute abnormality. There is sigmoid colon diverticulosis. There is wall thickening and inflammation of the mid sigmoid colon compatible with acute diverticulitis. This is in a similar location when compared to the prior study. No perforation or abscess identified. There are small pericolonic lymph nodes at this level. The appendix is normal in size. There is a large amount of stool throughout the colon. There is no bowel obstruction. PERITONEUM AND RETROPERITONEUM: No ascites. No free air. VASCULATURE: Aorta is normal in caliber with atherosclerotic calcifications. LYMPH NODES: Small pericolonic lymph nodes are present at the level of the mid sigmoid colon. No other significant lymphadenopathy. REPRODUCTIVE ORGANS: Prostate gland is enlarged. BONES AND SOFT TISSUES: Left hip arthroplasty is present. There is a small fat-containing umbilical hernia. There are small fat-containing bilateral inguinal hernias. No acute osseous abnormality. No focal soft tissue abnormality. IMPRESSION: 1. Acute diverticulitis of the mid sigmoid colon in a similar location to prior, without perforation or abscess; small reactive pericolonic lymph nodes. 2. Sigmoid diverticulosis. 3. Right renal simple-appearing cysts ; no follow-up imaging recommended per incidental renal cyst guidelines. 4. Stable 3 mm solid left lower lobe pulmonary nodule; no routine follow-up imaging recommended as per Fleischner Society Guidelines (in absence of cancer history or  immunocompromise). Electronically signed by: Greig Pique MD 07/15/2024 07:03 PM EDT RP Workstation: [id number]        Scheduled Meds:  acyclovir  400 mg Oral 5 X Daily   aspirin  EC  81 mg Oral Daily   carbidopa-levodopa  1 tablet Oral TID   clopidogrel   75 mg Oral Q breakfast   enoxaparin  (LOVENOX ) injection  40 mg Subcutaneous Q24H   levothyroxine   75 mcg Oral Q0600   metoprolol  succinate  12.5 mg Oral Daily   pantoprazole   40 mg Oral Daily   polyethylene glycol  17 g Oral Daily   senna-docusate  1 tablet Oral QHS   sodium chloride  flush  3 mL Intravenous Q12H   Continuous Infusions:  cefTRIAXone (ROCEPHIN)  IV 2 g (07/17/24 0641)   metronidazole  500 mg (07/17/24 0741)     LOS: 0 days    Time spent: 40 minutes    Renato Applebaum, MD [facility]

## 2024-07-17 NOTE — Plan of Care (Signed)
  Problem: Education: Goal: Knowledge of General Education information will improve Description: Including pain rating scale, medication(s)/side effects and non-pharmacologic comfort measures Outcome: Progressing   Problem: Education: Goal: Knowledge of General Education information will improve Description: Including pain rating scale, medication(s)/side effects and non-pharmacologic comfort measures Outcome: Progressing   Problem: Health Behavior/Discharge Planning: Goal: Ability to manage health-related needs will improve Outcome: Progressing   Problem: Clinical Measurements: Goal: Ability to maintain clinical measurements within normal limits will improve Outcome: Progressing Goal: Will remain free from infection Outcome: Progressing Goal: Diagnostic test results will improve Outcome: Progressing Goal: Respiratory complications will improve Outcome: Progressing

## 2024-07-17 NOTE — Care Management Obs Status (Signed)
 MEDICARE OBSERVATION STATUS NOTIFICATION   Patient Details  Name: Jared [name] MRN: [id number] Date of Birth: Jan 29, 1951   Medicare Observation Status Notification Given:  Yes    Alfonse [name], RN 07/17/2024, 12:49 PM

## 2024-07-17 NOTE — TOC Initial Note (Addendum)
 Transition of Care [facility]) - Initial/Assessment Note    Patient Details  Name: Jared [name] MRN: [id number] Date of Birth: [birth year]  [facility]) CM/SW Contact:    Jared [name], RN Phone Number: 07/17/2024, 12:13 PM  Clinical Narrative:    Patient admitted from home, resides with spouse in a private residence, A&O x 4, independent with functional mobility .TOC  will follow for dc needs.  -1245pm MOON completed.              Expected Discharge Plan: Home/Self Care     Patient Goals and CMS Choice Patient states their goals for this hospitalization and ongoing recovery are:: resides in private residence          Expected Discharge Plan and Services       Living arrangements for the past 2 months: Single Family Home                                      Prior Living Arrangements/Services Living arrangements for the past 2 months: Single Family Home Lives with:: Spouse Patient language and need for interpreter reviewed:: Yes        Need for Family Participation in Patient Care: Yes (Comment) Care giver support system in place?: Yes (comment)   Criminal Activity/Legal Involvement Pertinent to Current Situation/Hospitalization: No - Comment as needed  Activities of Daily Living   ADL Screening (condition at time of admission) Independently performs ADLs?: Yes (appropriate for developmental age) Is the patient deaf or have difficulty hearing?: No Does the patient have difficulty seeing, even when wearing glasses/contacts?: No Does the patient have difficulty concentrating, remembering, or making decisions?: No  Permission Sought/Granted                  Emotional Assessment       Orientation: : Oriented to Situation, Oriented to Self, Oriented to Place, Oriented to  Time Alcohol  / Substance Use: Not Applicable Psych Involvement: No (comment)  Admission diagnosis:  Diverticulitis [K57.92] Acute diverticulitis [K57.92] Patient  Active Problem List   Diagnosis Date Noted   Orthostatic hypotension 07/16/2024   History of CAD (coronary artery disease) 07/16/2024   Carotid artery stenosis 07/16/2024   S/P peripheral artery angioplasty 07/16/2024   History of esophageal stricture 07/16/2024   Atelectasis 07/02/2024   Acute vesicular eczema of foot 07/02/2024   Malignant melanoma of skin (HCC) 07/02/2024   Unilateral post-traumatic osteoarthritis, left hip 07/02/2024   Type 2 diabetes mellitus without complications (HCC) 07/02/2024   Preoperative clearance 02/26/2024   Mixed conductive and sensorineural hearing loss of both ears 01/02/2024   Bilateral temporomandibular joint pain 12/08/2023   Hematoma following percutaneous transluminal coronary angioplasty 10/24/2023   Claudication in peripheral vascular disease 10/19/2023   Peripheral arterial disease 07/11/2023   Carotid artery disease 07/11/2023   Allergic conjunctivitis of both eyes 02/22/2023   Drooling 12/21/2022   Hypersalivation 12/21/2022   Muscle tone increased 12/21/2022   Shuffling gait 12/21/2022   Tremor of both hands 12/21/2022   Severe persistent asthma with exacerbation (HCC) 08/08/2022   Chronic low back pain 04/04/2022   Lumbar spondylosis 04/04/2022   Hyperglycemia 03/01/2022   Acute diverticulitis 02/28/2022   Pain in joint of left knee 11/11/2021   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   Generalized weakness 06/02/2021   Hypokalemia, inadequate intake 06/02/2021   Hypothyroidism 06/02/2021   Chronic kidney  disease, stage 3a (HCC) 06/02/2021   Pain of left hip joint 05/30/2021   BPH with obstruction/lower urinary tract symptoms 03/25/2021   Pre-diabetes 12/28/2020   Vitamin D  deficiency 12/09/2020   Class 1 obesity with serious comorbidity and body mass index (BMI) of 33.0 to 33.9 in adult 12/09/2020   Viral URI 07/13/2020   AKI (acute kidney injury) 11/17/2019   COVID-19 virus infection 11/16/2019   OSA  on CPAP 08/28/2019   Pain due to onychomycosis of toenails of both feet 08/27/2019   Chronic kidney disease due to hypertension 07/31/2019   Parkinsonism (HCC) 07/16/2019   Lumbago with sciatica 03/04/2019   Severe persistent asthma without complication (HCC) 01/30/2019   Seasonal and perennial allergic rhinitis 01/30/2019   Trigger finger of right thumb 10/30/2018   Trigger index finger of right hand 10/30/2018   Status post total knee replacement, 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   Stress fracture of femur 03/15/2018   Allergic rhinitis due to allergen 12/21/2017   LPRD (laryngopharyngeal reflux disease) 12/21/2017   Tear of medial meniscus of knee 12/12/2017   Osteoarthritis of knee 10/03/2017   Chronic obstructive pulmonary disease (HCC) 07/10/2017   Allergic rhinoconjunctivitis 05/21/2015   Brachial neuritis 10/13/2014   Cervical radiculopathy 09/11/2014   C7 cervical fracture (HCC) 08/27/2014   Cervical spine fracture (HCC) 08/23/2014   Abnormal brain MRI 07/23/2014   Parkinsonian features 06/25/2014   Cervical vertebral fusion 05/02/2014   Cervical radiculitis 04/23/2014   Snoring 12/25/2013   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   Insomnia due to mental condition 12/25/2013   Sleep talking    Essential hypertension 07/08/2013   Depression, prolonged 06/26/2013   Night sweats 06/26/2013   Chest pain at rest 06/25/2013   Testicular hypofunction 11/12/2012   Bronchiectasis without acute exacerbation (HCC) 01/16/2008   PULMONARY NODULE 12/20/2007   HYPERNEPHROMA 11/27/2007   Mixed hyperlipidemia 11/27/2007   Coronary atherosclerosis 11/27/2007   HEMORRHOIDS, INTERNAL 11/27/2007   Moderate persistent asthma 11/27/2007   ESOPHAGITIS 11/27/2007   ESOPHAGEAL STRICTURE 11/27/2007   GERD (gastroesophageal reflux disease) 11/27/2007   GASTRITIS, ACUTE 11/27/2007   DIVERTICULOSIS, COLON 11/27/2007   ABDOMINAL PAIN, LEFT UPPER QUADRANT 11/27/2007   HIP FRACTURE, LEFT 11/27/2007    FRACTURE, TIBIA 11/27/2007   BPH (benign prostatic hyperplasia) 11/27/2007   PCP:  Shepard Ade, MD Pharmacy:   [id number] - [place], [place] - [address] AT [facility] [address] [place] [address] Phone: [phone] Fax: [phone]  [id number] - [place], [place] - [address] AT [facility] [address] [place] [address] Phone: [phone] Fax: [phone]  [id number] [place], [place] - [address] AT [facility] OF [facility] & [address] [place] [address] Phone: [phone] Fax: [phone]     Social Drivers of Health (SDOH) Social History: SDOH Screenings   Food Insecurity: No Food Insecurity (07/16/2024)  Housing: Low Risk  (07/16/2024)  Transportation Needs: No Transportation Needs (07/16/2024)  Utilities: Not At Risk (07/16/2024)  Depression (PHQ2-9): Medium Risk (10/28/2020)  Social Connections: Socially Integrated (07/16/2024)  Tobacco Use: Low Risk  (07/16/2024)   SDOH Interventions:     Readmission Risk Interventions     No data to display

## 2024-07-18 ENCOUNTER — Emergency Department (HOSPITAL_COMMUNITY)

## 2024-07-18 ENCOUNTER — Encounter (HOSPITAL_COMMUNITY): Payer: Self-pay | Admitting: Emergency Medicine

## 2024-07-18 ENCOUNTER — Emergency Department (HOSPITAL_COMMUNITY)
Admission: EM | Admit: 2024-07-18 | Discharge: 2024-07-19 | Disposition: A | Discharge status: Home / Self Care | Attending: Emergency Medicine | Admitting: Emergency Medicine

## 2024-07-18 ENCOUNTER — Other Ambulatory Visit: Payer: Self-pay

## 2024-07-18 DIAGNOSIS — Z905 Acquired absence of kidney: Secondary | ICD-10-CM | POA: Insufficient documentation

## 2024-07-18 DIAGNOSIS — R10A2 Flank pain, left side: Secondary | ICD-10-CM | POA: Diagnosis not present

## 2024-07-18 DIAGNOSIS — N289 Disorder of kidney and ureter, unspecified: Secondary | ICD-10-CM | POA: Diagnosis not present

## 2024-07-18 DIAGNOSIS — G20C Parkinsonism, unspecified: Secondary | ICD-10-CM | POA: Insufficient documentation

## 2024-07-18 DIAGNOSIS — K5732 Diverticulitis of large intestine without perforation or abscess without bleeding: Secondary | ICD-10-CM | POA: Insufficient documentation

## 2024-07-18 DIAGNOSIS — K5792 Diverticulitis of intestine, part unspecified, without perforation or abscess without bleeding: Secondary | ICD-10-CM | POA: Diagnosis not present

## 2024-07-18 DIAGNOSIS — Z7982 Long term (current) use of aspirin: Secondary | ICD-10-CM | POA: Insufficient documentation

## 2024-07-18 DIAGNOSIS — Z79899 Other long term (current) drug therapy: Secondary | ICD-10-CM | POA: Insufficient documentation

## 2024-07-18 DIAGNOSIS — N281 Cyst of kidney, acquired: Secondary | ICD-10-CM | POA: Diagnosis not present

## 2024-07-18 DIAGNOSIS — R1032 Left lower quadrant pain: Secondary | ICD-10-CM | POA: Diagnosis not present

## 2024-07-18 DIAGNOSIS — I10 Essential (primary) hypertension: Secondary | ICD-10-CM | POA: Insufficient documentation

## 2024-07-18 LAB — CBC
HCT: 46.4 % (ref 39.0–52.0)
Hemoglobin: 14.1 g/dL (ref 13.0–17.0)
MCH: 24.7 pg — ABNORMAL LOW (ref 26.0–34.0)
MCHC: 30.4 g/dL (ref 30.0–36.0)
MCV: 81.4 fL (ref 80.0–100.0)
Platelets: 356 K/uL (ref 150–400)
RBC: 5.7 MIL/uL (ref 4.22–5.81)
RDW: 16.7 % — ABNORMAL HIGH (ref 11.5–15.5)
WBC: 8.5 K/uL (ref 4.0–10.5)
nRBC: 0 % (ref 0.0–0.2)

## 2024-07-18 LAB — COMPREHENSIVE METABOLIC PANEL WITH GFR
ALT: 12 U/L (ref 0–44)
AST: 19 U/L (ref 15–41)
Albumin: 4.1 g/dL (ref 3.5–5.0)
Alkaline Phosphatase: 83 U/L (ref 38–126)
Anion gap: 13 (ref 5–15)
BUN: 17 mg/dL (ref 8–23)
CO2: 23 mmol/L (ref 22–32)
Calcium: 9.5 mg/dL (ref 8.9–10.3)
Chloride: 102 mmol/L (ref 98–111)
Creatinine, Ser: 1.18 mg/dL (ref 0.61–1.24)
GFR, Estimated: >60 mL/min (ref >60)
Glucose, Bld: 115 mg/dL — ABNORMAL HIGH (ref 70–99)
Potassium: 4.2 mmol/L (ref 3.5–5.1)
Sodium: 138 mmol/L (ref 135–145)
Total Bilirubin: 0.4 mg/dL (ref 0.0–1.2)
Total Protein: 8.1 g/dL (ref 6.5–8.1)

## 2024-07-18 LAB — LIPASE, BLOOD: Lipase: 15 U/L (ref 11–51)

## 2024-07-18 MED ORDER — POLYETHYLENE GLYCOL 3350 17 G PO PACK: 17.0000 g | PACK | Freq: Every day | ORAL | 0 refills | Status: AC

## 2024-07-18 MED ORDER — METRONIDAZOLE 500 MG PO TABS: 500.0000 mg | ORAL_TABLET | Freq: Two times a day (BID) | ORAL | 0 refills | Status: AC

## 2024-07-18 MED ORDER — SENNOSIDES-DOCUSATE SODIUM 8.6-50 MG PO TABS: 1.0000 | ORAL_TABLET | Freq: Every day | ORAL | 0 refills | Status: DC

## 2024-07-18 MED ORDER — DIPHENHYDRAMINE HCL 25 MG PO CAPS
25.0000 mg | ORAL_CAPSULE | Freq: Once | ORAL | Status: AC
  Administered 2024-07-19: 25 mg via ORAL
  Filled 2024-07-18: qty 1

## 2024-07-18 MED ORDER — GUAIFENESIN 100 MG/5ML PO LIQD: 10.0000 mL | Freq: Four times a day (QID) | ORAL | 0 refills | Status: AC | PRN

## 2024-07-18 MED ORDER — SODIUM CHLORIDE 0.9 % IV BOLUS
1000.0000 mL | Freq: Once | INTRAVENOUS | Status: AC
  Administered 2024-07-19: 1000 mL via INTRAVENOUS

## 2024-07-18 MED ORDER — HYDROCODONE-ACETAMINOPHEN 5-325 MG PO TABS: 1.0000 | ORAL_TABLET | Freq: Four times a day (QID) | ORAL | 0 refills | Status: DC | PRN

## 2024-07-18 MED ORDER — MORPHINE SULFATE (PF) 4 MG/ML IV SOLN
4.0000 mg | Freq: Once | INTRAVENOUS | Status: AC
  Administered 2024-07-19: 4 mg via INTRAVENOUS
  Filled 2024-07-18: qty 1

## 2024-07-18 MED ORDER — CEPHALEXIN 500 MG PO CAPS: 500.0000 mg | ORAL_CAPSULE | Freq: Three times a day (TID) | ORAL | 0 refills | Status: AC

## 2024-07-18 MED ORDER — ACYCLOVIR 400 MG PO TABS: 400.0000 mg | ORAL_TABLET | Freq: Every day | ORAL | 0 refills | Status: AC

## 2024-07-18 NOTE — ED Triage Notes (Signed)
 Pt discharged from inpatient this morning after admission for diverticulitis. Reports left side pain is worse now than it was the entire time he was here.  10/10. No n/v/d.

## 2024-07-18 NOTE — Discharge Summary (Signed)
 Physician Discharge Summary  Jared [name] FMW:[id number] DOB: 1951-07-17 DOA: 07/15/2024  PCP: Shepard Ade, MD  Admit date: 07/15/2024 Discharge date: 07/18/2024  Admitted From: Home Disposition: Home  Recommendations for Outpatient Follow-up:  Follow up with PCP in 1-2 weeks Please obtain BMP/CBC in one week   Home Health: N/A Equipment/Devices: N/A  Discharge Condition: Stable CODE STATUS: Full code Diet recommendation: Low-salt diet, nutritional supplements  Discharge summary: 73 year old with history of chronic orthostatic hypotension, coronary artery disease, carotid artery stenosis, peripheral arterial disease, hypothyroidism, Parkinson's and GERD who has been suffering from diverticulitis for at least 2 weeks now on different antibiotics.  He was sent for repeat CT scan due to persistent pain, he developed hives after IV contrast.  Also continued to have pain on the left lower quadrant so sent to the ER.  In the emergency room, hemodynamically stable.  Patient was found to have shingles rashes on his back.  CT scan with uncomplicated left descending colon diverticulitis.  Admitted with IV antibiotics.  Patient was treated with Rocephin and Flagyl , day 3 today.  He has adequately improved especially after starting MiraLAX .  Pain is mild and rare.  Able to go home today.   # Acute uncomplicated diverticulitis: Failed outpatient antibiotic therapy.  Given failed therapy, treated with IV Rocephin and Flagyl .  Day 3 of IV antibiotics today.  Symptoms mostly improved.  On regular diet, rare mild pain using occasional hydrocodone . Patient was on various antibiotics since last 2 weeks.  Probably already healing diverticulitis now however as he responded well to Rocephin and Flagyl  will continue Keflex and Flagyl  for another 7 days. Also with significant constipation.  Scheduled Senokot and MiraLAX  for 2 weeks. Follow-up with primary care physician.   Shingles rash: Patient  does have vesicular rash along the right posterior chest wall in a dermatomal distribution.  Already improving.  Was prescribed Valtrex as outpatient, not taking.  He was just prescribed acyclovir in the hospital, completed 5 days of therapy.     Chronic medical issues including GERD, on PPI.  Continue Hypothyroidism, on thyroxine.  Continue Hypertension, on Toprol -XL.  Continue Parkinson's, on Sinemet.  Continue Coronary artery disease, on Toprol -XL, aspirin  and Plavix .  Continue  Adequately improved.  Able to go home today.  Discharge Diagnoses:  Principal Problem:   Acute diverticulitis Active Problems:   Hypothyroidism   GERD (gastroesophageal reflux disease)   Essential hypertension   Parkinsonism (HCC)   Orthostatic hypotension   History of CAD (coronary artery disease)   Carotid artery stenosis   S/P peripheral artery angioplasty   History of esophageal stricture    Discharge Instructions  Discharge Instructions     Call MD for:  persistant nausea and vomiting   Complete by: As directed    Call MD for:  severe uncontrolled pain   Complete by: As directed    Diet - low sodium heart healthy   Complete by: As directed    Increase activity slowly   Complete by: As directed    No wound care   Complete by: As directed       Allergies as of 07/18/2024       Reactions   Azithromycin  Anaphylaxis   Ulcers in mouth, nose, and ears   Iodinated Contrast Media Hives   Nsaids Other (See Comments)   Was told not to take d/t pt only having 1 kidney   Penicillins Hives, Itching, Nausea And Vomiting, Other (See Comments)   Tolerates Cefepime    Iohexol   Hives, Itching   Per radiologist Dr. Scott - pt needs 13 hr prep. Pt was seen outpatient and given oral iohexol , broke out in hives and itching. Dr. Scott was consulted 07/15/24   Misc. Sulfonamide Containing Compounds Hives   Codeine Nausea Only, Nausea And Vomiting   Penicillamine Rash   Pholcodine Rash   Statins  Other (See Comments)   Myalgias   Sulfonamide Derivatives Rash        Medication List     STOP taking these medications    ciprofloxacin  500 MG tablet Commonly known as: CIPRO    clindamycin  300 MG capsule Commonly known as: CLEOCIN    oxyCODONE  5 MG immediate release tablet Commonly known as: Oxy IR/ROXICODONE    valACYclovir 1000 MG tablet Commonly known as: VALTREX       TAKE these medications    acyclovir 400 MG tablet Commonly known as: ZOVIRAX Take 1 tablet (400 mg total) by mouth 5 (five) times daily for 2 days.   albuterol  (2.5 MG/3ML) 0.083% nebulizer solution Commonly known as: PROVENTIL  Take 3 mLs (2.5 mg total) by nebulization every 6 (six) hours as needed for wheezing or shortness of breath.   albuterol  108 (90 Base) MCG/ACT inhaler Commonly known as: VENTOLIN  HFA Inhale 2 puffs into the lungs every 4 (four) hours as needed for wheezing or shortness of breath. Can inhale two puffs every four to six hours as needed for cough, wheeze, shortness of breath, or chest tightness.   ascorbic acid  500 MG tablet Commonly known as: VITAMIN C  Take 500 mg by mouth 2 (two) times daily.   aspirin  EC 81 MG tablet Take 1 tablet (81 mg total) by mouth daily. Swallow whole.   carbidopa-levodopa 25-100 MG tablet Commonly known as: SINEMET IR Take 1 tablet by mouth 3 (three) times daily.   cephALEXin 500 MG capsule Commonly known as: KEFLEX Take 1 capsule (500 mg total) by mouth 3 (three) times daily for 7 days.   clopidogrel  75 MG tablet Commonly known as: PLAVIX  Take 1 tablet (75 mg total) by mouth daily with breakfast.   CO Q-10 PO Take 1 capsule by mouth daily.   DULoxetine  60 MG capsule Commonly known as: CYMBALTA  Take 60 mg by mouth every morning.   EPINEPHrine  0.3 mg/0.3 mL Soaj injection Commonly known as: EPI-PEN Inject 0.3 mg into the muscle as needed for anaphylaxis.   finasteride  5 MG tablet Commonly known as: PROSCAR  Take 5 mg by mouth  daily.   guaiFENesin  100 MG/5ML liquid Commonly known as: ROBITUSSIN Take 10 mLs by mouth every 6 (six) hours as needed for cough or to loosen phlegm.   HYDROcodone -acetaminophen  5-325 MG tablet Commonly known as: NORCO/VICODIN Take 1 tablet by mouth every 6 (six) hours as needed for moderate pain (pain score 4-6). What changed: when to take this   levothyroxine  75 MCG tablet Commonly known as: SYNTHROID  Take 75 mcg by mouth daily before breakfast.   meloxicam 15 MG tablet Commonly known as: MOBIC Take 15 mg by mouth daily.   metoprolol  succinate 25 MG 24 hr tablet Commonly known as: TOPROL -XL TAKE 1 TABLET(25 MG) BY MOUTH DAILY   metroNIDAZOLE  500 MG tablet Commonly known as: FLAGYL  Take 1 tablet (500 mg total) by mouth 2 (two) times daily for 7 days. What changed: when to take this   montelukast  10 MG tablet Commonly known as: SINGULAIR  Take 10 mg by mouth daily.   multivitamin tablet Take 1 tablet by mouth at bedtime.   Nucala  100 MG/ML Sosy  Generic drug: mepolizumab  Inject 1 mL into the skin every 30 (thirty) days.   Ozempic (1 MG/DOSE) 4 MG/3ML Sopn Generic drug: Semaglutide (1 MG/DOSE) Inject 1 mg into the skin once a week. Monday's   pantoprazole  40 MG tablet Commonly known as: PROTONIX  TAKE 1 TABLET BY MOUTH DAILY   polyethylene glycol 17 g packet Commonly known as: MIRALAX  / GLYCOLAX  Take 17 g by mouth daily.   Repatha  SureClick 140 MG/ML Soaj Generic drug: Evolocumab  INJECT 1 PEN INTO THE SKIN EVERY 14 DAYS   senna-docusate 8.6-50 MG tablet Commonly known as: Senokot-S Take 1 tablet by mouth at bedtime.   sodium chloride  0.65 % Soln nasal spray Commonly known as: OCEAN Place 1 spray into both nostrils as needed for congestion.   tamsulosin  0.4 MG Caps capsule Commonly known as: FLOMAX  Take 0.4 mg by mouth at bedtime.   Trelegy Ellipta 100-62.5-25 MCG/ACT Aepb Generic drug: Fluticasone -Umeclidin-Vilant Take 1 puff by mouth daily.   zinc   gluconate 50 MG tablet Take 50 mg by mouth daily.        Allergies  Allergen Reactions   Azithromycin  Anaphylaxis    Ulcers in mouth, nose, and ears   Iodinated Contrast Media Hives   Nsaids Other (See Comments)    Was told not to take d/t pt only having 1 kidney   Penicillins Hives, Itching, Nausea And Vomiting and Other (See Comments)    Tolerates Cefepime    Iohexol  Hives and Itching    Per radiologist Dr. Scott - pt needs 13 hr prep. Pt was seen outpatient and given oral iohexol , broke out in hives and itching. Dr. Scott was consulted 07/15/24   Misc. Sulfonamide Containing Compounds Hives   Codeine Nausea Only and Nausea And Vomiting   Penicillamine Rash   Pholcodine Rash   Statins Other (See Comments)    Myalgias   Sulfonamide Derivatives Rash    Consultations: None   Procedures/Studies: CT ABDOMEN PELVIS WO CONTRAST Result Date: 07/15/2024 EXAM: CT ABDOMEN AND PELVIS WITHOUT CONTRAST 07/15/2024 06:03:11 PM TECHNIQUE: CT of the abdomen and pelvis was performed without the administration of intravenous contrast. Multiplanar reformatted images are provided for review. Automated exposure control, iterative reconstruction, and/or weight-based adjustment of the mA/kV was utilized to reduce the radiation dose to as low as reasonably achievable. COMPARISON: CT abdomen and pelvis dated 2025. CLINICAL HISTORY: diverticulitis. Pt sent from radiology r/t liquid contrast reaction. FINDINGS: LOWER CHEST: Stable 3 mm left lower lobe nodule. LIVER: The liver is unremarkable. GALLBLADDER AND BILE DUCTS: Gallbladder is unremarkable. No biliary ductal dilatation. SPLEEN: There are calcified granulomas in the spleen. PANCREAS: No acute abnormality. ADRENAL GLANDS: No acute abnormality. KIDNEYS, URETERS AND BLADDER: Left nephrectomy again seen. There is a cyst in the right kidney measuring 4.2 cm. Additional cyst identified in the right kidney measuring 2.4 cm. Per consensus, no follow-up is  needed for simple Bosniak type 1 and 2 renal cysts, unless the patient has a malignancy history or risk factors. No stones in the right kidney or ureters. No hydronephrosis. No perinephric or periureteral stranding. Urinary bladder is unremarkable. GI AND BOWEL: Stomach demonstrates no acute abnormality. There is sigmoid colon diverticulosis. There is wall thickening and inflammation of the mid sigmoid colon compatible with acute diverticulitis. This is in a similar location when compared to the prior study. No perforation or abscess identified. There are small pericolonic lymph nodes at this level. The appendix is normal in size. There is a large amount of stool throughout the colon. There  is no bowel obstruction. PERITONEUM AND RETROPERITONEUM: No ascites. No free air. VASCULATURE: Aorta is normal in caliber with atherosclerotic calcifications. LYMPH NODES: Small pericolonic lymph nodes are present at the level of the mid sigmoid colon. No other significant lymphadenopathy. REPRODUCTIVE ORGANS: Prostate gland is enlarged. BONES AND SOFT TISSUES: Left hip arthroplasty is present. There is a small fat-containing umbilical hernia. There are small fat-containing bilateral inguinal hernias. No acute osseous abnormality. No focal soft tissue abnormality. IMPRESSION: 1. Acute diverticulitis of the mid sigmoid colon in a similar location to prior, without perforation or abscess; small reactive pericolonic lymph nodes. 2. Sigmoid diverticulosis. 3. Right renal simple-appearing cysts ; no follow-up imaging recommended per incidental renal cyst guidelines. 4. Stable 3 mm solid left lower lobe pulmonary nodule; no routine follow-up imaging recommended as per Fleischner Society Guidelines (in absence of cancer history or immunocompromise). Electronically signed by: Greig Pique MD 07/15/2024 07:03 PM EDT RP Workstation: [id number]   CT Head Wo Contrast Result Date: 07/08/2024 CLINICAL DATA:  Clemens.  Hit head.  Patient is on  Plavix  EXAM: CT HEAD WITHOUT CONTRAST CT CERVICAL SPINE WITHOUT CONTRAST TECHNIQUE: Multidetector CT imaging of the head and cervical spine was performed following the standard protocol without intravenous contrast. Multiplanar CT image reconstructions of the cervical spine were also generated. RADIATION DOSE REDUCTION: This exam was performed according to the departmental dose-optimization program which includes automated exposure control, adjustment of the mA and/or kV according to patient size and/or use of iterative reconstruction technique. COMPARISON:  Head CT 06/01/2021 FINDINGS: CT HEAD FINDINGS Brain: Stable cerebral atrophy, ventriculomegaly and periventricular white matter disease. No extra-axial fluid collections are identified. No CT findings for acute hemispheric infarction or intracranial hemorrhage. No mass lesions. The brainstem and cerebellum are normal. Vascular: Stable vascular calcifications. No aneurysm hyperdense vessels. Skull: No skull fracture or bone lesion. Sinuses/Orbits: The paranasal sinuses and mastoid air cells are clear. The globes are intact. Other: No scalp lesions or scalp hematoma. CT CERVICAL SPINE FINDINGS Alignment: Normal Skull base and vertebrae: No acute fracture. No primary bone lesion or focal pathologic process. Surgical changes from prior fusion procedure. Anterior and interbody fusion hardware at C5-6 and C6-7. No complicating features. Soft tissues and spinal canal: No prevertebral fluid or swelling. No visible canal hematoma. Disc levels: The spinal canal is fairly generous. No significant canal stenosis. There is a wide decompressive laminectomy noted C6-7. The right C5-6 facet is fused and the left facet is mildly widened. Upper chest: The lung apices are grossly clear. Other: No neck mass adenopathy hematoma. Minimal scattered carotid artery calcifications. IMPRESSION: 1. Stable cerebral atrophy, ventriculomegaly and periventricular white matter disease. 2. No  acute intracranial findings or skull fracture. 3. Normal alignment of the cervical vertebral bodies and no acute fracture. 4. Surgical changes from prior fusion procedure at C5-6 and C6-7. No complicating features. Electronically Signed   By: MYRTIS Stammer M.D.   On: 07/08/2024 13:36   CT Cervical Spine Wo Contrast Result Date: 07/08/2024 CLINICAL DATA:  Clemens.  Hit head.  Patient is on Plavix  EXAM: CT HEAD WITHOUT CONTRAST CT CERVICAL SPINE WITHOUT CONTRAST TECHNIQUE: Multidetector CT imaging of the head and cervical spine was performed following the standard protocol without intravenous contrast. Multiplanar CT image reconstructions of the cervical spine were also generated. RADIATION DOSE REDUCTION: This exam was performed according to the departmental dose-optimization program which includes automated exposure control, adjustment of the mA and/or kV according to patient size and/or use of iterative reconstruction technique. COMPARISON:  Head CT 06/01/2021 FINDINGS: CT HEAD FINDINGS Brain: Stable cerebral atrophy, ventriculomegaly and periventricular white matter disease. No extra-axial fluid collections are identified. No CT findings for acute hemispheric infarction or intracranial hemorrhage. No mass lesions. The brainstem and cerebellum are normal. Vascular: Stable vascular calcifications. No aneurysm hyperdense vessels. Skull: No skull fracture or bone lesion. Sinuses/Orbits: The paranasal sinuses and mastoid air cells are clear. The globes are intact. Other: No scalp lesions or scalp hematoma. CT CERVICAL SPINE FINDINGS Alignment: Normal Skull base and vertebrae: No acute fracture. No primary bone lesion or focal pathologic process. Surgical changes from prior fusion procedure. Anterior and interbody fusion hardware at C5-6 and C6-7. No complicating features. Soft tissues and spinal canal: No prevertebral fluid or swelling. No visible canal hematoma. Disc levels: The spinal canal is fairly generous. No  significant canal stenosis. There is a wide decompressive laminectomy noted C6-7. The right C5-6 facet is fused and the left facet is mildly widened. Upper chest: The lung apices are grossly clear. Other: No neck mass adenopathy hematoma. Minimal scattered carotid artery calcifications. IMPRESSION: 1. Stable cerebral atrophy, ventriculomegaly and periventricular white matter disease. 2. No acute intracranial findings or skull fracture. 3. Normal alignment of the cervical vertebral bodies and no acute fracture. 4. Surgical changes from prior fusion procedure at C5-6 and C6-7. No complicating features. Electronically Signed   By: MYRTIS Stammer M.D.   On: 07/08/2024 13:36   CT ABDOMEN PELVIS WO CONTRAST Result Date: 06/28/2024 EXAM: CT ABDOMEN AND PELVIS WITHOUT CONTRAST 06/28/2024 01:52:50 PM TECHNIQUE: CT of the abdomen and pelvis was performed without the administration of intravenous contrast. One cup of oral contrast was given right before the exam with the goal to enhance the duodenum but not obscure the ureters. Multiplanar reformatted images are provided for review. Automated exposure control, iterative reconstruction, and/or weight-based adjustment of the mA/kV was utilized to reduce the radiation dose to as low as reasonably achievable. COMPARISON: CTA chest 06/05/2024 and previous study 02/20/2020. CLINICAL HISTORY: Left flank pain and LUQ pain x 1 week. FINDINGS: LOWER CHEST: Scattered coronary calcifications. 3 mm nodule in the lateral basal segment left lower lobe (4:14) stable since 02/20/2020 consistent with benign lesion. According to the Fleischner Society pulmonary nodule recommendations, the finding is consistent with a single solid nodule <6 mm in a stable nodule, and the recommendation is no routine follow-up required (for low-risk patients). LIVER: Coarse calcifications peripherally in the hepatic segments 3 and 5, stable since 02/20/2020. GALLBLADDER AND BILE DUCTS: Gallbladder is  unremarkable. No biliary ductal dilatation. SPLEEN: No acute abnormality. PANCREAS: No acute abnormality. ADRENAL GLANDS: No acute abnormality. KIDNEYS, URETERS AND BLADDER: 2.6 cm medial cortical cyst in the mid right kidney, present since 02/20/2020. 4.1 cm peripheral cortical cyst from the right lower pole. Remote left nephrectomy. No stones in the kidneys or ureters. No hydronephrosis. No perinephric or periureteral stranding. Urinary bladder is unremarkable. GI AND BOWEL: The stomach is distended by indigestion material. Normal appendix. Multiple descending and sigmoid diverticula. Inflammatory changes around the descending/sigmoid junction without abscess. There is no bowel obstruction. PERITONEUM AND RETROPERITONEUM: No ascites. No free air. VASCULATURE: Aorta is normal in caliber. LYMPH NODES: No lymphadenopathy. REPRODUCTIVE ORGANS: No acute abnormality. BONES AND SOFT TISSUES: Post left hip arthroplasty. Moderate left inguinal hernia containing only fat. Degenerative disc disease L5 to S1. Implanted stimulator catheter leads bilateral to the superior articular processes of L4. No acute osseous abnormality. IMPRESSION: 1. Inflammatory changes at the descendingsigmoid junction without abscess, consistent with uncomplicated  diverticulitis. 2. Additional non-urgent findings as above. Electronically signed by: Dayne Hassell MD 06/28/2024 02:18 PM EDT RP Workstation: [id number]   (Echo, Carotid, EGD, Colonoscopy, ERCP)    Subjective: Patient seen and examined.  Denies any complaints.  Eating regular diet.  He felt much better after having a bowel movement yesterday.  Eager to go home.  Mobilizing around in the room.  No evidence of orthostatic symptoms.   Discharge Exam: Vitals:   07/16/24 1544 07/17/24 1400  BP: 123/80 128/79  Pulse: 64 63  Resp: 18 16  Temp: 97.7 F (36.5 C) (!) 97.5 F (36.4 C)  SpO2: 100% 98%   Vitals:   07/16/24 0807 07/16/24 1439 07/16/24 1544 07/17/24 1400  BP:  131/72 137/71 123/80 128/79  Pulse: 63 63 64 63  Resp: 18 18 18 16   Temp: 97.6 F (36.4 C) 97.8 F (36.6 C) 97.7 F (36.5 C) (!) 97.5 F (36.4 C)  TempSrc: Oral Oral Oral Oral  SpO2: 96% 96% 100% 98%  Weight:      Height:        General: Pt is alert, awake, not in acute distress.  Pleasant and interactive. Cardiovascular: RRR, S1/S2 +, no rubs, no gallops.  Patient has a Zio patch on her chest wall. Respiratory: CTA bilaterally, no wheezing, no rhonchi Abdominal: Soft, NT, ND, bowel sounds +, there is no area of tenderness or guarding rigidity today. Extremities: no edema, no cyanosis    The results of significant diagnostics from this hospitalization (including imaging, microbiology, ancillary and laboratory) are listed below for reference.     Microbiology: Recent Results (from the past 240 hours)  Culture, blood (Routine X 2) w Reflex to ID Panel     Status: None (Preliminary result)   Collection Time: 07/16/24  5:41 AM   Specimen: BLOOD LEFT ARM  Result Value Ref Range Status   Specimen Description   Final    BLOOD LEFT ARM Performed at [facility], [address], [place], [place] [address]    Special Requests   Final    BOTTLES DRAWN AEROBIC ONLY Blood Culture adequate volume Performed at [facility], [address], [place], [place] [address]    Culture   Final    NO GROWTH 2 DAYS Performed at [facility], [address], [place], [place] [address]    Report Status PENDING  Incomplete  Culture, blood (Routine X 2) w Reflex to ID Panel     Status: None (Preliminary result)   Collection Time: 07/16/24  5:41 AM   Specimen: BLOOD LEFT ARM  Result Value Ref Range Status   Specimen Description   Final    BLOOD LEFT ARM Performed at [facility], [address] [place], [place] [address]    Special Requests   Final    BOTTLES DRAWN AEROBIC ONLY Blood Culture adequate volume Performed at [facility], [address] [place], [place] [address]    Culture   Final    NO GROWTH 2 DAYS Performed at [facility], [address], [place], [place] [address]    Report Status PENDING  Incomplete     Labs: BNP (last 3 results) Recent Labs    06/06/24 1634  BNP 44.8   Basic Metabolic Panel: Recent Labs  Lab 07/15/24 1140 07/15/24 1832 07/16/24 0541  NA 136 132* 138  K 4.3 3.9 4.0  CL 103 98 106  CO2 25 23 23  GLUCOSE 119* 95 102*  BUN 27* 26* 25*  CREATININE 1.35 1.41* 1.30*  CALCIUM  9.1 9.3 8.7*   Liver Function Tests: Recent Labs  Lab 07/15/24 1140 07/15/24 1832 07/16/24 0541  AST 12 17 24   ALT 5 <5 12  ALKPHOS 66 81 73  BILITOT 0.3 0.3 0.2  PROT 7.9 7.9 7.1  ALBUMIN 4.2 4.3 3.7   Recent Labs  Lab 07/15/24 1140 07/15/24 1832  LIPASE 13.0 16   No results for input(s): AMMONIA in the last 168 hours. CBC: Recent Labs  Lab 07/15/24 1140 07/15/24 1832 07/16/24 0541  WBC 10.2 11.6* 8.9  NEUTROABS 7.1  --   --   HGB 13.3 13.3 12.6*  HCT 42.0 42.1 41.5  MCV 80.4 81.4 84.5  PLT 323.0 313 313   Cardiac Enzymes: No results for input(s): CKTOTAL, CKMB, CKMBINDEX, TROPONINI in the last 168 hours. BNP: Invalid input(s): POCBNP CBG: No results for input(s): GLUCAP in the last 168 hours. D-Dimer No results for input(s): DDIMER in the last 72 hours. Hgb A1c No results for input(s): HGBA1C in the last 72 hours. Lipid Profile No results for input(s): CHOL, HDL, LDLCALC, TRIG, CHOLHDL, LDLDIRECT in the last 72 hours. Thyroid  function studies No results for input(s): TSH, T4TOTAL, T3FREE, THYROIDAB in the last 72 hours.  Invalid input(s): FREET3 Anemia work up No results for input(s): VITAMINB12, FOLATE, FERRITIN, TIBC, IRON, RETICCTPCT in the last 72 hours. Urinalysis    Component Value Date/Time   COLORURINE COLORLESS (A) 07/15/2024 2118   APPEARANCEUR CLEAR 07/15/2024 2118   LABSPEC 1.006 07/15/2024  2118   PHURINE 6.0 07/15/2024 2118   GLUCOSEU NEGATIVE 07/15/2024 2118   HGBUR NEGATIVE 07/15/2024 2118   BILIRUBINUR NEGATIVE 07/15/2024 2118   KETONESUR NEGATIVE 07/15/2024 2118   PROTEINUR NEGATIVE 07/15/2024 2118   UROBILINOGEN 0.2 03/17/2010 1452   NITRITE NEGATIVE 07/15/2024 2118   LEUKOCYTESUR NEGATIVE 07/15/2024 2118   Sepsis Labs Recent Labs  Lab 07/15/24 1140 07/15/24 1832 07/16/24 0541  WBC 10.2 11.6* 8.9   Microbiology Recent Results (from the past 240 hours)  Culture, blood (Routine X 2) w Reflex to ID Panel     Status: None (Preliminary result)   Collection Time: 07/16/24  5:41 AM   Specimen: BLOOD LEFT ARM  Result Value Ref Range Status   Specimen Description   Final    BLOOD LEFT ARM Performed at [facility], [address] [place], [place] [address]    Special Requests   Final    BOTTLES DRAWN AEROBIC ONLY Blood Culture adequate volume Performed at [facility], [address], [place], [place] [address]    Culture   Final    NO GROWTH 2 DAYS Performed at [facility], [address], [place], [place] [address]    Report Status PENDING  Incomplete  Culture, blood (Routine X 2) w Reflex to ID Panel     Status: None (Preliminary result)   Collection Time: 07/16/24  5:41 AM   Specimen: BLOOD LEFT ARM  Result Value Ref Range Status   Specimen Description   Final    BLOOD LEFT ARM Performed at [facility], [address], [place], [place] [address]    Special Requests   Final    BOTTLES DRAWN AEROBIC ONLY Blood Culture adequate volume Performed at [facility], [address] [place], [place] [address]    Culture   Final    NO GROWTH 2 DAYS Performed at  [facility], [address] [place], [place] [address]    Report Status PENDING  Incomplete     Time coordinating discharge: 35 minutes  SIGNED:   Renato Applebaum, MD  [facility] 07/18/2024, 9:08 AM

## 2024-07-18 NOTE — Plan of Care (Signed)
   Problem: Safety: Goal: Ability to remain free from injury will improve Outcome: Progressing   Problem: Elimination: Goal: Will not experience complications related to urinary retention Outcome: Progressing

## 2024-07-18 NOTE — Care Management Important Message (Signed)
 Important Message  Patient Details IM Letter given. Name: KELTEN ENOCHS MRN: [id number] Date of Birth: [birth year]   Important Message Given:  Yes - Medicare IM     Melba Ates 07/18/2024, 10:09 AM

## 2024-07-18 NOTE — ED Provider Notes (Incomplete)
 [place] [facility] EMERGENCY DEPARTMENT AT [facility] Provider Note   CSN: [id number] Arrival date & time: 07/18/24  2155     Patient presents with: Flank Pain   Jared [name] is a 73 y.o. male with history of internal hemorrhoids, esophagitis, GERD, diverticulitis, BPH, hypertension, Parkinson's.  Presents to ED complaining of left-sided abdominal pain.  States that he was discharged today after being admitted for diverticulitis.  Reports that he had attempted multiple rounds of antibiotics prior to admission.  Was admitted and ultimately discharged today after receiving 3 days of Rocephin and Flagyl .  He is also found to have shingles to the right upper shoulder and was placed on Valtrex which he reports he is still taking.  Does take blood thinners.  Denies any blood in stool.  Denies nausea, vomiting, fevers today.  Reports he is here due to increased left-sided abdominal pain.  Reports 10 out of 10.  Reports pain was better controlled upon discharge.  States that he was sent home with hydrocodone  which does not work for him.  Reports that he was sent home with Keflex and Flagyl  as well which he has been taking today.   Flank Pain       Prior to Admission medications   Medication Sig Start Date End Date Taking? Authorizing Provider  acyclovir (ZOVIRAX) 400 MG tablet Take 1 tablet (400 mg total) by mouth 5 (five) times daily for 2 days. 07/18/24 07/20/24  Raenelle Coria, MD  albuterol  (PROVENTIL ) (2.5 MG/3ML) 0.083% nebulizer solution Take 3 mLs (2.5 mg total) by nebulization every 6 (six) hours as needed for wheezing or shortness of breath. 12/20/22   Kozlow, Camellia JINNY, MD  albuterol  (VENTOLIN  HFA) 108 (90 Base) MCG/ACT inhaler Inhale 2 puffs into the lungs every 4 (four) hours as needed for wheezing or shortness of breath. Can inhale two puffs every four to six hours as needed for cough, wheeze, shortness of breath, or chest tightness. 02/14/23   Jeneal Danita Macintosh, MD   aspirin  EC 81 MG tablet Take 1 tablet (81 mg total) by mouth daily. Swallow whole. 10/21/23   Henry Manuelita NOVAK, NP  carbidopa-levodopa (SINEMET IR) 25-100 MG tablet Take 1 tablet by mouth 3 (three) times daily.    [provider]  cephALEXin (KEFLEX) 500 MG capsule Take 1 capsule (500 mg total) by mouth 3 (three) times daily for 7 days. 07/18/24 07/25/24  Raenelle Coria, MD  clopidogrel  (PLAVIX ) 75 MG tablet Take 1 tablet (75 mg total) by mouth daily with breakfast. 10/21/23   Henry Manuelita B, NP  Coenzyme Q10 (CO Q-10 PO) Take 1 capsule by mouth daily.    [provider]  DULoxetine  (CYMBALTA ) 60 MG capsule Take 60 mg by mouth every morning. 04/22/21   [provider]  EPINEPHrine  0.3 mg/0.3 mL IJ SOAJ injection Inject 0.3 mg into the muscle as needed for anaphylaxis. 06/12/23   [provider]  finasteride  (PROSCAR ) 5 MG tablet Take 5 mg by mouth daily.    [provider]  guaiFENesin  (ROBITUSSIN) 100 MG/5ML liquid Take 10 mLs by mouth every 6 (six) hours as needed for cough or to loosen phlegm. 07/18/24   Ghimire, Kuber, MD  HYDROcodone -acetaminophen  (NORCO/VICODIN) 5-325 MG tablet Take 1 tablet by mouth every 6 (six) hours as needed for moderate pain (pain score 4-6). 07/18/24   Raenelle Coria, MD  levothyroxine  (SYNTHROID ) 75 MCG tablet Take 75 mcg by mouth daily before breakfast.    [provider]  meloxicam (MOBIC)  15 MG tablet Take 15 mg by mouth daily. 06/28/24   [provider]  metoprolol  succinate (TOPROL -XL) 25 MG 24 hr tablet TAKE 1 TABLET(25 MG) BY MOUTH DAILY 03/01/24   Court Dorn PARAS, MD  metroNIDAZOLE  (FLAGYL ) 500 MG tablet Take 1 tablet (500 mg total) by mouth 2 (two) times daily for 7 days. 07/18/24 07/25/24  Raenelle Coria, MD  montelukast  (SINGULAIR ) 10 MG tablet Take 10 mg by mouth daily. 06/04/24   [provider]  Multiple Vitamin (MULTIVITAMIN) tablet Take 1 tablet by mouth at bedtime.     [provider]  NUCALA  100 MG/ML SOSY Inject 1 mL into the skin every 30 (thirty) days. 02/14/23   [provider]  OZEMPIC, 1 MG/DOSE, 4 MG/3ML SOPN Inject 1 mg into the skin once a week. Monday's    [provider]  pantoprazole  (PROTONIX ) 40 MG tablet TAKE 1 TABLET BY MOUTH DAILY 12/04/23   Berry, Jonathan J, MD  polyethylene glycol (MIRALAX  / GLYCOLAX ) 17 g packet Take 17 g by mouth daily. 07/18/24   Raenelle Coria, MD  REPATHA  SURECLICK 140 MG/ML SOAJ INJECT 1 PEN INTO THE SKIN EVERY 14 DAYS 11/23/23   Court Dorn PARAS, MD  senna-docusate (SENOKOT-S) 8.6-50 MG tablet Take 1 tablet by mouth at bedtime. 07/18/24 08/17/24  Ghimire, Kuber, MD  sodium chloride  (OCEAN) 0.65 % SOLN nasal spray Place 1 spray into both nostrils as needed for congestion.    [provider]  tamsulosin  (FLOMAX ) 0.4 MG CAPS capsule Take 0.4 mg by mouth at bedtime.    [provider]  DOMINIC BECK 100-62.5-25 MCG/ACT AEPB Take 1 puff by mouth daily. 05/14/24   [provider]  vitamin C  (ASCORBIC ACID ) 500 MG tablet Take 500 mg by mouth 2 (two) times daily.     [provider]  zinc  gluconate 50 MG tablet Take 50 mg by mouth daily.    [provider]    Allergies: Azithromycin , Iodinated contrast media, Nsaids, Penicillins, Iohexol , Misc. sulfonamide containing compounds, Codeine, Penicillamine, Pholcodine, Statins, and Sulfonamide derivatives    Review of Systems  Genitourinary:  Positive for flank pain.    Updated Vital Signs BP (!) 142/94 (BP Location: Left Arm)   Pulse 76   Resp 16   SpO2 100%   Physical Exam  (all labs ordered are listed, but only abnormal results are displayed) Labs Reviewed  COMPREHENSIVE METABOLIC PANEL WITH GFR - Abnormal; Notable for the following components:      Result Value   Glucose, Bld 115 (*)    All other components within normal limits  CBC - Abnormal; Notable for the following components:   MCH 24.7 (*)     RDW 16.7 (*)    All other components within normal limits  LIPASE, BLOOD    EKG: None  Radiology: No results found.  {Document cardiac monitor, telemetry assessment procedure when appropriate:32947} Procedures   Medications Ordered in the ED  sodium chloride  0.9 % bolus 1,000 mL (has no administration in time range)  morphine  (PF) 4 MG/ML injection 4 mg (has no administration in time range)  diphenhydrAMINE  (BENADRYL ) capsule 25 mg (has no administration in time range)      {Click here for ABCD2, HEART and other calculators REFRESH Note before signing:1}                              Medical Decision Making Amount and/or Complexity of Data  Reviewed Labs: ordered. Radiology: ordered.  Risk Prescription drug management.   ***  {Document critical care time when appropriate  Document review of labs and clinical decision tools ie CHADS2VASC2, etc  Document your independent review of radiology images and any outside records  Document your discussion with family members, caretakers and with consultants  Document social determinants of health affecting pt's care  Document your decision making why or why not admission, treatments were needed:32947:::1}   Final diagnoses:  None    ED Discharge Orders     None

## 2024-07-18 NOTE — ED Provider Notes (Addendum)
 [name] EMERGENCY DEPARTMENT AT [facility] Provider Note   CSN: [id number] Arrival date & time: 07/18/24  2155     Patient presents with: Flank Pain   Jared [name] is a 73 y.o. male with history of internal hemorrhoids, esophagitis, GERD, diverticulitis, BPH, hypertension, Parkinson's.  Presents to ED complaining of left-sided abdominal pain.  States that he was discharged today after being admitted for diverticulitis.  Reports that he had attempted multiple rounds of antibiotics prior to admission.  Was admitted and ultimately discharged today after receiving 3 days of Rocephin and Flagyl .  He is also found to have shingles to the right upper shoulder and was placed on Valtrex which he reports he is still taking.  Does take blood thinners.  Denies any blood in stool.  Denies nausea, vomiting, fevers today.  Reports he is here due to increased left-sided abdominal pain.  Reports 10 out of 10.  Reports pain was better controlled upon discharge.  States that he was sent home with hydrocodone  which does not work for him.  Reports that he was sent home with Keflex and Flagyl  as well which he has been taking today.   Flank Pain       Prior to Admission medications   Medication Sig Start Date End Date Taking? Authorizing Provider  acyclovir (ZOVIRAX) 400 MG tablet Take 1 tablet (400 mg total) by mouth 5 (five) times daily for 2 days. 07/18/24 07/20/24  Raenelle Coria, MD  albuterol  (PROVENTIL ) (2.5 MG/3ML) 0.083% nebulizer solution Take 3 mLs (2.5 mg total) by nebulization every 6 (six) hours as needed for wheezing or shortness of breath. 12/20/22   Kozlow, Camellia JINNY, MD  albuterol  (VENTOLIN  HFA) 108 (90 Base) MCG/ACT inhaler Inhale 2 puffs into the lungs every 4 (four) hours as needed for wheezing or shortness of breath. Can inhale two puffs every four to six hours as needed for cough, wheeze, shortness of breath, or chest tightness. 02/14/23   Jeneal Danita Macintosh, MD   aspirin  EC 81 MG tablet Take 1 tablet (81 mg total) by mouth daily. Swallow whole. 10/21/23   Henry Manuelita NOVAK, NP  carbidopa-levodopa (SINEMET IR) 25-100 MG tablet Take 1 tablet by mouth 3 (three) times daily.    [provider]  cephALEXin (KEFLEX) 500 MG capsule Take 1 capsule (500 mg total) by mouth 3 (three) times daily for 7 days. 07/18/24 07/25/24  Raenelle Coria, MD  clopidogrel  (PLAVIX ) 75 MG tablet Take 1 tablet (75 mg total) by mouth daily with breakfast. 10/21/23   Henry Manuelita B, NP  Coenzyme Q10 (CO Q-10 PO) Take 1 capsule by mouth daily.    [provider]  DULoxetine  (CYMBALTA ) 60 MG capsule Take 60 mg by mouth every morning. 04/22/21   [provider]  EPINEPHrine  0.3 mg/0.3 mL IJ SOAJ injection Inject 0.3 mg into the muscle as needed for anaphylaxis. 06/12/23   [provider]  finasteride  (PROSCAR ) 5 MG tablet Take 5 mg by mouth daily.    [provider]  guaiFENesin  (ROBITUSSIN) 100 MG/5ML liquid Take 10 mLs by mouth every 6 (six) hours as needed for cough or to loosen phlegm. 07/18/24   Ghimire, Kuber, MD  HYDROcodone -acetaminophen  (NORCO/VICODIN) 5-325 MG tablet Take 1 tablet by mouth every 6 (six) hours as needed for moderate pain (pain score 4-6). 07/18/24   Ghimire, Kuber, MD  levothyroxine  (SYNTHROID ) 75 MCG tablet Take 75 mcg by mouth daily before breakfast.    [provider]  meloxicam (MOBIC)  15 MG tablet Take 15 mg by mouth daily. 06/28/24   [provider]  metoprolol  succinate (TOPROL -XL) 25 MG 24 hr tablet TAKE 1 TABLET(25 MG) BY MOUTH DAILY 03/01/24   Court Dorn PARAS, MD  metroNIDAZOLE  (FLAGYL ) 500 MG tablet Take 1 tablet (500 mg total) by mouth 2 (two) times daily for 7 days. 07/18/24 07/25/24  Raenelle Coria, MD  montelukast  (SINGULAIR ) 10 MG tablet Take 10 mg by mouth daily. 06/04/24   [provider]  Multiple Vitamin (MULTIVITAMIN) tablet Take 1 tablet by mouth at bedtime.     [provider]  NUCALA  100 MG/ML SOSY Inject 1 mL into the skin every 30 (thirty) days. 02/14/23   [provider]  OZEMPIC, 1 MG/DOSE, 4 MG/3ML SOPN Inject 1 mg into the skin once a week. Monday's    [provider]  pantoprazole  (PROTONIX ) 40 MG tablet TAKE 1 TABLET BY MOUTH DAILY 12/04/23   Berry, Jonathan J, MD  polyethylene glycol (MIRALAX  / GLYCOLAX ) 17 g packet Take 17 g by mouth daily. 07/18/24   Raenelle Coria, MD  REPATHA  SURECLICK 140 MG/ML SOAJ INJECT 1 PEN INTO THE SKIN EVERY 14 DAYS 11/23/23   Court Dorn PARAS, MD  senna-docusate (SENOKOT-S) 8.6-50 MG tablet Take 1 tablet by mouth at bedtime. 07/18/24 08/17/24  Ghimire, Kuber, MD  sodium chloride  (OCEAN) 0.65 % SOLN nasal spray Place 1 spray into both nostrils as needed for congestion.    [provider]  tamsulosin  (FLOMAX ) 0.4 MG CAPS capsule Take 0.4 mg by mouth at bedtime.    [provider]  DOMINIC BECK 100-62.5-25 MCG/ACT AEPB Take 1 puff by mouth daily. 05/14/24   [provider]  vitamin C  (ASCORBIC ACID ) 500 MG tablet Take 500 mg by mouth 2 (two) times daily.     [provider]  zinc  gluconate 50 MG tablet Take 50 mg by mouth daily.    [provider]    Allergies: Azithromycin , Iodinated contrast media, Nsaids, Penicillins, Iohexol , Misc. sulfonamide containing compounds, Codeine, Penicillamine, Pholcodine, Statins, and Sulfonamide derivatives    Review of Systems  Genitourinary:  Positive for flank pain.  All other systems reviewed and are negative.   Updated Vital Signs BP (!) 154/80   Pulse 70   Temp 98.1 F (36.7 C) (Oral)   Resp 16   SpO2 98%   Physical Exam Vitals and nursing note reviewed.  Constitutional:      General: He is not in acute distress.    Appearance: He is well-developed.  HENT:     Head: Normocephalic and atraumatic.  Eyes:     Conjunctiva/sclera: Conjunctivae normal.  Cardiovascular:     Rate and Rhythm: Normal rate and  regular rhythm.     Heart sounds: No murmur heard. Pulmonary:     Effort: Pulmonary effort is normal. No respiratory distress.     Breath sounds: Normal breath sounds.  Abdominal:     Palpations: Abdomen is soft.     Tenderness: There is abdominal tenderness.     Comments: Tenderness to left lower quadrant  Musculoskeletal:        General: No swelling.     Cervical back: Neck supple.  Skin:    General: Skin is warm and dry.     Capillary Refill: Capillary refill takes less than 2 seconds.  Neurological:     Mental Status: He is alert and oriented to person, place, and time. Mental status is at baseline.  Psychiatric:  Mood and Affect: Mood normal.     (all labs ordered are listed, but only abnormal results are displayed) Labs Reviewed  COMPREHENSIVE METABOLIC PANEL WITH GFR - Abnormal; Notable for the following components:      Result Value   Glucose, Bld 115 (*)    All other components within normal limits  CBC - Abnormal; Notable for the following components:   MCH 24.7 (*)    RDW 16.7 (*)    All other components within normal limits  LIPASE, BLOOD    EKG: None  Radiology: CT ABDOMEN PELVIS WO CONTRAST Result Date: 07/19/2024 EXAM: CT ABDOMEN AND PELVIS WITHOUT CONTRAST 07/18/2024 11:59:00 PM TECHNIQUE: CT of the abdomen and pelvis was performed without the administration of intravenous contrast. Multiplanar reformatted images are provided for review. Automated exposure control, iterative reconstruction, and/or weight-based adjustment of the mA/kV was utilized to reduce the radiation dose to as low as reasonably achievable. COMPARISON: CT abdomen and pelvis 07/15/2024 CLINICAL HISTORY: Diverticulitis, complication suspected. FINDINGS: LOWER CHEST: No acute abnormality. LIVER: The liver is unremarkable. GALLBLADDER AND BILE DUCTS: Gallbladder is unremarkable. No biliary ductal dilatation. SPLEEN: No acute abnormality. PANCREAS: No acute abnormality. ADRENAL GLANDS: No  acute abnormality. KIDNEYS, URETERS AND BLADDER: Left nephrectomy. No stones in the kidneys or ureters. No hydronephrosis. No perinephric or periureteral stranding. Urinary bladder is unremarkable. There are well-defined, low-attenuation lesions of the kidney suggestive of simple renal cysts. Simple renal cysts do not require additional follow-up unless clinically indicated due to signs or symptoms. GI AND BOWEL: Colonic diverticulosis. Similar mid sigmoid bowel thickening and peri-colonic fat stranding along a focal diverticulum. PO-contrast reaches the rectum. No intramural abscess formation. There is no bowel obstruction. PERITONEUM AND RETROPERITONEUM: No ascites. No free air. No organized fluid collections. VASCULATURE: Aorta is normal in caliber. Moderate atherosclerotic plaque. LYMPH NODES: No lymphadenopathy. REPRODUCTIVE ORGANS: No acute abnormality. BONES AND SOFT TISSUES: Stable implanted stimulator catheter leads bilateral to the superior articular processes of L4. No acute osseous abnormality. Total left hip arthroplasty. No focal soft tissue abnormality. Bilateral fat-containing inguinal hernias. IMPRESSION: 1. Similar mid sigmoid diverticulitis without intramural abscess formation. Recommend colonoscopy status post treatment and status post complete resolution of inflammatory changes to exclude an underlying lesion. 2. Left nephrectomy. Electronically signed by: Morgane Naveau MD 07/19/2024 12:20 AM EDT RP Workstation: [id number]    Procedures   Medications Ordered in the ED  HYDROmorphone  (DILAUDID ) injection 0.5 mg (has no administration in time range)  sodium chloride  0.9 % bolus 1,000 mL (1,000 mLs Intravenous New Bag/Given 07/19/24 0027)  morphine  (PF) 4 MG/ML injection 4 mg (4 mg Intravenous Given 07/19/24 0026)  diphenhydrAMINE  (BENADRYL ) capsule 25 mg (25 mg Oral Given 07/19/24 0026)    Medical Decision Making Amount and/or Complexity of Data Reviewed Labs: ordered. Radiology:  ordered.  Risk Prescription drug management.   73 year old male presents for evaluation.  Please see HPI.  On exam the patient is afebrile, nontachycardic.  His lung sounds are clear bilaterally, no hypoxia.  Abdomen has tenderness in left lower quadrant.  Neuroexam at baseline.  Patient does have vesicular rash to right upper extremity.  Overall nontoxic.  Reassuring vital signs.  Hemodynamically stable.  Patient CBC without leukocytosis or anemia.  Lipase 15 doubt pancreatitis.  CMP without electrolyte derangement, no transaminitis, anion gap 13, stable creatinine.  Denies urinary symptoms so no indication for urinalysis tonight.  Repeat CT scan shows stable mid sigmoid diverticulitis without intramural abscess formation.  They do recommend colonoscopy at posttreatment.  Patient reports that he is taking antibiotics at home.  States he has had no nausea or vomiting chest pain.  He was given 4 mg of morphine , 1 L of fluid and reports that the pain is decreased.  He reports he has hydrocodone  at home.  He will be discharged home and advised to continue to take antibiotics.  Advised to follow-up with his GI doctor for further care.  Given return precautions and he voiced understanding.  Stable to discharge home.  Addendum: Prior to discharge, nursing staff notified me patient had broken out in hives to his inner left thigh.  Given Solu-Medrol , famotidine .  Will continue to observe.  Patient reports this occurred while admitted after having IV contrast however patient has had no IV contrast here tonight.  After patient was observed for 1-1/2 hours, after receiving Solu-Medrol  and famotidine , his hives have completely resolved.  I offered to send patient home with steroids but he defers stating he wishes to go home and observe himself.  He reports that if he has recurrence of hives he will seek medical attention.  He is advised to continue taking all antibiotics as prescribed and to follow-up with his  GI team and he voiced understanding.  Stable to discharge.   Final diagnoses:  Left flank pain    ED Discharge Orders     None           Ruthell Lonni JULIANNA DEVONNA 07/19/24 0249    Carita Senior, MD 07/19/24 [id number]

## 2024-07-18 NOTE — Progress Notes (Signed)
 Discharge instructions given to patient questions asked and answered.

## 2024-07-19 MED ORDER — METHYLPREDNISOLONE SODIUM SUCC 125 MG IJ SOLR
125.0000 mg | INTRAMUSCULAR | Status: AC
Start: 2024-07-19 — End: 2024-07-19
  Administered 2024-07-19: 125 mg via INTRAVENOUS
  Filled 2024-07-19: qty 2

## 2024-07-19 MED ORDER — HYDROMORPHONE HCL 1 MG/ML IJ SOLN
0.5000 mg | Freq: Once | INTRAMUSCULAR | Status: AC
  Administered 2024-07-19: 0.5 mg via INTRAVENOUS
  Filled 2024-07-19: qty 1

## 2024-07-19 MED ORDER — FAMOTIDINE IN NACL 20-0.9 MG/50ML-% IV SOLN
20.0000 mg | Freq: Once | INTRAVENOUS | Status: AC
  Administered 2024-07-19: 20 mg via INTRAVENOUS
  Filled 2024-07-19: qty 50

## 2024-07-19 NOTE — Discharge Instructions (Addendum)
 Please continue taking antibiotics as prescribed.  Take hydrocodone  pain medication as prescribed.  Do not drive or operate machinery while taking this medication.  Do not mix with alcohol .  Follow-up with your GI team.  Return to ED with new symptoms.

## 2024-07-19 NOTE — Telephone Encounter (Signed)
 Inbound call from patient stating he went back to the emergency room last night and had a CT scan completed. Patient is requesting a call to discuss further. Please advise, thank you

## 2024-07-21 LAB — CULTURE, BLOOD (ROUTINE X 2)
Culture: NO GROWTH
Culture: NO GROWTH
Special Requests: ADEQUATE
Special Requests: ADEQUATE

## 2024-07-24 ENCOUNTER — Ambulatory Visit: Admitting: Cardiovascular Disease

## 2024-07-24 ENCOUNTER — Ambulatory Visit: Admitting: Emergency Medicine

## 2024-07-24 ENCOUNTER — Ambulatory Visit: Attending: Cardiology

## 2024-07-24 VITALS — BP 112/67 | HR 95

## 2024-07-24 DIAGNOSIS — G20A1 Parkinson's disease without dyskinesia, without mention of fluctuations: Secondary | ICD-10-CM | POA: Diagnosis not present

## 2024-07-24 DIAGNOSIS — I6521 Occlusion and stenosis of right carotid artery: Secondary | ICD-10-CM | POA: Diagnosis not present

## 2024-07-24 NOTE — Progress Notes (Unsigned)
fs

## 2024-07-24 NOTE — Progress Notes (Incomplete)
 Patient ID: Jared [name]                 DOB: [birth year]                      MRN: [id number]      HPI: [name] is a 73 y.o. male referred by Dr. Court for medication management. PMH is significant for CAD, PAD, hypothyroidism, Parkinson's disease and GERD.  The patient presents today in good spirits and brought his medications with him. He reports a recent hospitalization due to diverticulitis and shingles affecting his back. He was treated in the emergency room with antibiotics and steroids. He states he has has a follow-up appointment scheduled today with his internist.  Aside from these recent health events, the patient states he is feeling significantly better and has no current complaints. He confirms adherence to his prescribed medications, reports no difficulties with access or affordability, and denies experiencing any adverse effects that he attributes to his medications.   Family History:  Relation Problem Comments  Mother (Deceased) Asthma   Heart disease   Hypertension   Thyroid  disease     Father (Deceased at age 50) Alcoholism   Anxiety disorder   Barrett's esophagus   Bone cancer mets from esophagus  Cancer   Depression   Esophageal cancer   Heart disease   Obesity     Sister Diabetes     Sister Cancer Cervical cancer    Brother (Alive)   Maternal Uncle Colon cancer     Paternal Grandfather Heart attack (Age: 30)   Heart disease     Neg Hx Colon polyps   Rectal cancer   Stomach cancer      Social History:  Alcohol : None Smoking: None   Wt Readings from Last 3 Encounters:  07/16/24 197 lb (89.4 kg)  07/15/24 198 lb 4 oz (89.9 kg)  07/11/24 202 lb 9.6 oz (91.9 kg)   BP Readings from Last 3 Encounters:  07/24/24 112/67  07/19/24 135/70  07/18/24 (!) 142/86   Pulse Readings from Last 3 Encounters:  07/24/24 95  07/19/24 64  07/18/24 78    Renal function: Estimated Creatinine Clearance: 57.7 mL/min (by C-G formula based on SCr  of 1.18 mg/dL).  Past Medical History:  Diagnosis Date  . Anxiety   . Asthma   . Cervical vertebral fusion 05/02/2014   Now presenting with C5-6 radiculopathy  . Coronary artery disease   . COVID 01/2020   pain all over monoclonal antibodies given all symptoms resolved  . GERD (gastroesophageal reflux disease)   . H/O heart artery stent 2000   to right rca  . Heart attack (HCC) 2009  . History of depression 03/19/2021  . History of esophageal stricture   . History of spinal fracture 2015  . Hyperlipidemia   . Hypertension   . Hypothyroidism   . Neuroleptic induced parkinsonism    left arm tremor  . Osteoarthritis    lower back  . 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 Renal cell carcinoma 1997   Left kidney  . Sleep apnea    wears CPAP  . Sleep talking   . Snoring 12/25/2013  . Wears glasses     Current Outpatient Medications on File Prior to Visit  Medication Sig Dispense Refill  . albuterol  (PROVENTIL ) (2.5 MG/3ML) 0.083% nebulizer solution Take 3 mLs (2.5 mg total) by nebulization every 6 (six) hours as needed for wheezing or shortness  of breath. 75 mL 12  . albuterol  (VENTOLIN  HFA) 108 (90 Base) MCG/ACT inhaler Inhale 2 puffs into the lungs every 4 (four) hours as needed for wheezing or shortness of breath. Can inhale two puffs every four to six hours as needed for cough, wheeze, shortness of breath, or chest tightness. 18 g 1  . aspirin  EC 81 MG tablet Take 1 tablet (81 mg total) by mouth daily. Swallow whole. 30 tablet 12  . carbidopa-levodopa (SINEMET IR) 25-100 MG tablet Take 1 tablet by mouth 3 (three) times daily.    . cephALEXin (KEFLEX) 500 MG capsule Take 1 capsule (500 mg total) by mouth 3 (three) times daily for 7 days. 21 capsule 0  . clopidogrel  (PLAVIX ) 75 MG tablet Take 1 tablet (75 mg total) by mouth daily with breakfast. 90 tablet 2  . Coenzyme Q10 (CO Q-10 PO) Take 1 capsule by mouth daily.    . DULoxetine  (CYMBALTA ) 60 MG capsule Take 60  mg by mouth every morning.    . EPINEPHrine  0.3 mg/0.3 mL IJ SOAJ injection Inject 0.3 mg into the muscle as needed for anaphylaxis.    . finasteride  (PROSCAR ) 5 MG tablet Take 5 mg by mouth daily.    . gabapentin  (NEURONTIN ) 100 MG capsule Take 100 mg by mouth 3 (three) times daily.    . guaiFENesin  (ROBITUSSIN) 100 MG/5ML liquid Take 10 mLs by mouth every 6 (six) hours as needed for cough or to loosen phlegm. 120 mL 0  . levothyroxine  (SYNTHROID ) 75 MCG tablet Take 75 mcg by mouth daily before breakfast.    . metroNIDAZOLE  (FLAGYL ) 500 MG tablet Take 1 tablet (500 mg total) by mouth 2 (two) times daily for 7 days. 14 tablet 0  . montelukast  (SINGULAIR ) 10 MG tablet Take 10 mg by mouth daily.    . Multiple Vitamin (MULTIVITAMIN) tablet Take 1 tablet by mouth at bedtime.     . NUCALA  100 MG/ML SOSY Inject 1 mL into the skin every 30 (thirty) days.    SABRA OZEMPIC, 1 MG/DOSE, 4 MG/3ML SOPN Inject 1 mg into the skin once a week. Monday's    . pantoprazole  (PROTONIX ) 40 MG tablet TAKE 1 TABLET BY MOUTH DAILY 90 tablet 3  . polyethylene glycol (MIRALAX  / GLYCOLAX ) 17 g packet Take 17 g by mouth daily. 14 each 0  . REPATHA  SURECLICK 140 MG/ML SOAJ INJECT 1 PEN INTO THE SKIN EVERY 14 DAYS 2 mL 11  . sodium chloride  (OCEAN) 0.65 % SOLN nasal spray Place 1 spray into both nostrils as needed for congestion.    . tamsulosin  (FLOMAX ) 0.4 MG CAPS capsule Take 0.4 mg by mouth at bedtime.    . TRELEGY ELLIPTA 100-62.5-25 MCG/ACT AEPB Take 1 puff by mouth daily.    . vitamin C  (ASCORBIC ACID ) 500 MG tablet Take 500 mg by mouth 2 (two) times daily.     . zinc  gluconate 50 MG tablet Take 50 mg by mouth daily.     Current Facility-Administered Medications on File Prior to Visit  Medication Dose Route Frequency Provider Last Rate Last Admin  . omalizumab  (XOLAIR ) injection 150 mg  150 mg Subcutaneous Q28 days Cari Arlean HERO, FNP   150 mg at 06/20/22 1503  . omalizumab  (XOLAIR ) prefilled syringe 150 mg  150 mg  Subcutaneous Q28 days Tobie Arleta SQUIBB, MD   150 mg at 08/23/22 1113    Allergies  Allergen Reactions  . Azithromycin  Anaphylaxis    Ulcers in mouth, nose, and ears  .  Iodinated Contrast Media Hives  . 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 Penicillins Hives, Itching, Nausea And Vomiting and Other (See Comments)    Tolerates Cefepime   . Iohexol  Hives and Itching    Per radiologist Dr. Scott - pt needs 13 hr prep. Pt was seen outpatient and given oral iohexol , broke out in hives and itching. Dr. Scott was consulted 07/15/24  . Misc. Sulfonamide Containing Compounds Hives  . Codeine Nausea Only and Nausea And Vomiting  . Penicillamine Rash  . Pholcodine Rash  . Statins Other (See Comments)    Myalgias  . Sulfonamide Derivatives Rash    Blood pressure 112/67, pulse 95.   Assessment/Plan:  1. Hypertension -  No problem-specific Assessment & Plan notes found for this encounter.      Thank you  Hendrik Donath E. Anea Fodera, Pharm.D [place] [facility]. [facility] [address], [place], [place] [address] Phone: [phone]; Fax: [phone]

## 2024-07-25 DIAGNOSIS — C44622 Squamous cell carcinoma of skin of right upper limb, including shoulder: Secondary | ICD-10-CM | POA: Diagnosis not present

## 2024-07-25 NOTE — Assessment & Plan Note (Signed)
 Assessment:  Patient is currently asymptomatic and reports feeling well. No complaints or adverse effects related to current medications. Patient demonstrates understanding of medication regimen and reports full adherence. No issues with medication access or affordability Patient reports follow up appointment with internist today   Plan: Medication reconciliation performed Patient to maintain current regimen as prescribed Encouraged proper follow up with PCP - Patient to follow up with internist as scheduled today per ER recommendation. Encouraged patient to reach out to PharmD for medication management if needed Discussed availability of pharmacy pill packing services to support adherence; patient to consider if interested.

## 2024-07-30 ENCOUNTER — Ambulatory Visit: Payer: Self-pay | Admitting: Cardiology

## 2024-07-30 ENCOUNTER — Ambulatory Visit (HOSPITAL_COMMUNITY)
Admission: RE | Admit: 2024-07-30 | Discharge: 2024-07-30 | Disposition: A | Discharge status: Home / Self Care | Source: Ambulatory Visit | Attending: Cardiology | Admitting: Cardiology

## 2024-07-30 DIAGNOSIS — I25118 Atherosclerotic heart disease of native coronary artery with other forms of angina pectoris: Secondary | ICD-10-CM | POA: Insufficient documentation

## 2024-07-30 DIAGNOSIS — R55 Syncope and collapse: Secondary | ICD-10-CM | POA: Insufficient documentation

## 2024-07-31 NOTE — Telephone Encounter (Signed)
 Pt requesting a c/b to discuss results.

## 2024-08-01 ENCOUNTER — Other Ambulatory Visit: Payer: Self-pay | Admitting: Cardiology

## 2024-08-05 DIAGNOSIS — M5412 Radiculopathy, cervical region: Secondary | ICD-10-CM | POA: Diagnosis not present

## 2024-08-05 DIAGNOSIS — Z6829 Body mass index (BMI) 29.0-29.9, adult: Secondary | ICD-10-CM | POA: Diagnosis not present

## 2024-08-07 ENCOUNTER — Encounter (HOSPITAL_COMMUNITY): Payer: Self-pay

## 2024-08-07 ENCOUNTER — Emergency Department (HOSPITAL_COMMUNITY)

## 2024-08-07 ENCOUNTER — Other Ambulatory Visit: Payer: Self-pay

## 2024-08-07 ENCOUNTER — Inpatient Hospital Stay (HOSPITAL_COMMUNITY)
Admission: EM | Admit: 2024-08-07 | Discharge: 2024-08-10 | DRG: 392 | Disposition: A | Discharge status: Home / Self Care | Attending: Internal Medicine | Admitting: Internal Medicine

## 2024-08-07 DIAGNOSIS — Z7902 Long term (current) use of antithrombotics/antiplatelets: Secondary | ICD-10-CM

## 2024-08-07 DIAGNOSIS — R1084 Generalized abdominal pain: Secondary | ICD-10-CM | POA: Diagnosis not present

## 2024-08-07 DIAGNOSIS — Z7982 Long term (current) use of aspirin: Secondary | ICD-10-CM

## 2024-08-07 DIAGNOSIS — Z880 Allergy status to penicillin: Secondary | ICD-10-CM

## 2024-08-07 DIAGNOSIS — Z825 Family history of asthma and other chronic lower respiratory diseases: Secondary | ICD-10-CM

## 2024-08-07 DIAGNOSIS — R0789 Other chest pain: Secondary | ICD-10-CM | POA: Diagnosis not present

## 2024-08-07 DIAGNOSIS — Z885 Allergy status to narcotic agent status: Secondary | ICD-10-CM

## 2024-08-07 DIAGNOSIS — R1012 Left upper quadrant pain: Secondary | ICD-10-CM | POA: Diagnosis not present

## 2024-08-07 DIAGNOSIS — K5732 Diverticulitis of large intestine without perforation or abscess without bleeding: Principal | ICD-10-CM | POA: Diagnosis present

## 2024-08-07 DIAGNOSIS — R0600 Dyspnea, unspecified: Secondary | ICD-10-CM | POA: Diagnosis not present

## 2024-08-07 DIAGNOSIS — R7303 Prediabetes: Secondary | ICD-10-CM | POA: Diagnosis present

## 2024-08-07 DIAGNOSIS — R079 Chest pain, unspecified: Principal | ICD-10-CM | POA: Diagnosis present

## 2024-08-07 DIAGNOSIS — R0602 Shortness of breath: Secondary | ICD-10-CM | POA: Diagnosis not present

## 2024-08-07 DIAGNOSIS — Z881 Allergy status to other antibiotic agents status: Secondary | ICD-10-CM

## 2024-08-07 DIAGNOSIS — Z886 Allergy status to analgesic agent status: Secondary | ICD-10-CM

## 2024-08-07 DIAGNOSIS — J449 Chronic obstructive pulmonary disease, unspecified: Secondary | ICD-10-CM | POA: Diagnosis present

## 2024-08-07 DIAGNOSIS — N1831 Chronic kidney disease, stage 3a: Secondary | ICD-10-CM | POA: Diagnosis not present

## 2024-08-07 DIAGNOSIS — I739 Peripheral vascular disease, unspecified: Secondary | ICD-10-CM

## 2024-08-07 DIAGNOSIS — Z85528 Personal history of other malignant neoplasm of kidney: Secondary | ICD-10-CM

## 2024-08-07 DIAGNOSIS — R109 Unspecified abdominal pain: Secondary | ICD-10-CM | POA: Diagnosis not present

## 2024-08-07 DIAGNOSIS — Z955 Presence of coronary angioplasty implant and graft: Secondary | ICD-10-CM

## 2024-08-07 DIAGNOSIS — Z6830 Body mass index (BMI) 30.0-30.9, adult: Secondary | ICD-10-CM

## 2024-08-07 DIAGNOSIS — Z981 Arthrodesis status: Secondary | ICD-10-CM | POA: Diagnosis not present

## 2024-08-07 DIAGNOSIS — I951 Orthostatic hypotension: Secondary | ICD-10-CM | POA: Diagnosis present

## 2024-08-07 DIAGNOSIS — R918 Other nonspecific abnormal finding of lung field: Secondary | ICD-10-CM | POA: Diagnosis not present

## 2024-08-07 DIAGNOSIS — E1122 Type 2 diabetes mellitus with diabetic chronic kidney disease: Secondary | ICD-10-CM | POA: Diagnosis present

## 2024-08-07 DIAGNOSIS — Z8616 Personal history of COVID-19: Secondary | ICD-10-CM

## 2024-08-07 DIAGNOSIS — I130 Hypertensive heart and chronic kidney disease with heart failure and stage 1 through stage 4 chronic kidney disease, or unspecified chronic kidney disease: Secondary | ICD-10-CM | POA: Diagnosis present

## 2024-08-07 DIAGNOSIS — J42 Unspecified chronic bronchitis: Secondary | ICD-10-CM

## 2024-08-07 DIAGNOSIS — K219 Gastro-esophageal reflux disease without esophagitis: Secondary | ICD-10-CM | POA: Diagnosis present

## 2024-08-07 DIAGNOSIS — I2510 Atherosclerotic heart disease of native coronary artery without angina pectoris: Secondary | ICD-10-CM | POA: Diagnosis present

## 2024-08-07 DIAGNOSIS — Z8349 Family history of other endocrine, nutritional and metabolic diseases: Secondary | ICD-10-CM

## 2024-08-07 DIAGNOSIS — R051 Acute cough: Secondary | ICD-10-CM

## 2024-08-07 DIAGNOSIS — Z833 Family history of diabetes mellitus: Secondary | ICD-10-CM

## 2024-08-07 DIAGNOSIS — Z9079 Acquired absence of other genital organ(s): Secondary | ICD-10-CM

## 2024-08-07 DIAGNOSIS — E782 Mixed hyperlipidemia: Secondary | ICD-10-CM | POA: Diagnosis present

## 2024-08-07 DIAGNOSIS — I700 Atherosclerosis of aorta: Secondary | ICD-10-CM | POA: Diagnosis not present

## 2024-08-07 DIAGNOSIS — Z79899 Other long term (current) drug therapy: Secondary | ICD-10-CM

## 2024-08-07 DIAGNOSIS — Z888 Allergy status to other drugs, medicaments and biological substances status: Secondary | ICD-10-CM

## 2024-08-07 DIAGNOSIS — Z8249 Family history of ischemic heart disease and other diseases of the circulatory system: Secondary | ICD-10-CM

## 2024-08-07 DIAGNOSIS — E039 Hypothyroidism, unspecified: Secondary | ICD-10-CM | POA: Diagnosis present

## 2024-08-07 DIAGNOSIS — G20A1 Parkinson's disease without dyskinesia, without mention of fluctuations: Secondary | ICD-10-CM | POA: Diagnosis present

## 2024-08-07 DIAGNOSIS — N281 Cyst of kidney, acquired: Secondary | ICD-10-CM | POA: Diagnosis not present

## 2024-08-07 DIAGNOSIS — I252 Old myocardial infarction: Secondary | ICD-10-CM

## 2024-08-07 DIAGNOSIS — I10 Essential (primary) hypertension: Secondary | ICD-10-CM | POA: Diagnosis present

## 2024-08-07 DIAGNOSIS — R1032 Left lower quadrant pain: Secondary | ICD-10-CM | POA: Diagnosis not present

## 2024-08-07 DIAGNOSIS — I5032 Chronic diastolic (congestive) heart failure: Secondary | ICD-10-CM | POA: Diagnosis present

## 2024-08-07 DIAGNOSIS — N1832 Chronic kidney disease, stage 3b: Secondary | ICD-10-CM | POA: Diagnosis present

## 2024-08-07 DIAGNOSIS — Z91041 Radiographic dye allergy status: Secondary | ICD-10-CM

## 2024-08-07 DIAGNOSIS — N401 Enlarged prostate with lower urinary tract symptoms: Secondary | ICD-10-CM | POA: Diagnosis not present

## 2024-08-07 DIAGNOSIS — E669 Obesity, unspecified: Secondary | ICD-10-CM | POA: Diagnosis present

## 2024-08-07 DIAGNOSIS — Z811 Family history of alcohol abuse and dependence: Secondary | ICD-10-CM

## 2024-08-07 DIAGNOSIS — N400 Enlarged prostate without lower urinary tract symptoms: Secondary | ICD-10-CM | POA: Diagnosis present

## 2024-08-07 DIAGNOSIS — G20C Parkinsonism, unspecified: Secondary | ICD-10-CM | POA: Diagnosis present

## 2024-08-07 DIAGNOSIS — R7989 Other specified abnormal findings of blood chemistry: Secondary | ICD-10-CM | POA: Diagnosis present

## 2024-08-07 DIAGNOSIS — Z96653 Presence of artificial knee joint, bilateral: Secondary | ICD-10-CM | POA: Diagnosis present

## 2024-08-07 DIAGNOSIS — K5730 Diverticulosis of large intestine without perforation or abscess without bleeding: Secondary | ICD-10-CM | POA: Diagnosis not present

## 2024-08-07 DIAGNOSIS — Z882 Allergy status to sulfonamides status: Secondary | ICD-10-CM

## 2024-08-07 DIAGNOSIS — K5792 Diverticulitis of intestine, part unspecified, without perforation or abscess without bleeding: Secondary | ICD-10-CM | POA: Diagnosis present

## 2024-08-07 DIAGNOSIS — Z905 Acquired absence of kidney: Secondary | ICD-10-CM

## 2024-08-07 DIAGNOSIS — J4489 Other specified chronic obstructive pulmonary disease: Secondary | ICD-10-CM | POA: Diagnosis present

## 2024-08-07 DIAGNOSIS — G8929 Other chronic pain: Secondary | ICD-10-CM | POA: Diagnosis present

## 2024-08-07 DIAGNOSIS — G219 Secondary parkinsonism, unspecified: Secondary | ICD-10-CM

## 2024-08-07 DIAGNOSIS — G4733 Obstructive sleep apnea (adult) (pediatric): Secondary | ICD-10-CM | POA: Diagnosis present

## 2024-08-07 DIAGNOSIS — Z8679 Personal history of other diseases of the circulatory system: Secondary | ICD-10-CM

## 2024-08-07 DIAGNOSIS — Z883 Allergy status to other anti-infective agents status: Secondary | ICD-10-CM

## 2024-08-07 DIAGNOSIS — Z79890 Hormone replacement therapy (postmenopausal): Secondary | ICD-10-CM

## 2024-08-07 DIAGNOSIS — E1151 Type 2 diabetes mellitus with diabetic peripheral angiopathy without gangrene: Secondary | ICD-10-CM | POA: Diagnosis present

## 2024-08-07 DIAGNOSIS — Z800 Family history of malignant neoplasm of digestive organs: Secondary | ICD-10-CM

## 2024-08-07 DIAGNOSIS — K5720 Diverticulitis of large intestine with perforation and abscess without bleeding: Secondary | ICD-10-CM | POA: Diagnosis not present

## 2024-08-07 DIAGNOSIS — E119 Type 2 diabetes mellitus without complications: Secondary | ICD-10-CM

## 2024-08-07 DIAGNOSIS — Z818 Family history of other mental and behavioral disorders: Secondary | ICD-10-CM

## 2024-08-07 LAB — HEPATIC FUNCTION PANEL
ALT: 8 U/L (ref 0–44)
AST: 26 U/L (ref 15–41)
Albumin: 3.9 g/dL (ref 3.5–5.0)
Alkaline Phosphatase: 79 U/L (ref 38–126)
Bilirubin, Direct: 0.1 mg/dL (ref 0.0–0.2)
Indirect Bilirubin: 0.2 mg/dL — ABNORMAL LOW (ref 0.3–0.9)
Total Bilirubin: 0.3 mg/dL (ref 0.0–1.2)
Total Protein: 7.3 g/dL (ref 6.5–8.1)

## 2024-08-07 LAB — TROPONIN T, HIGH SENSITIVITY
Troponin T High Sensitivity: 33 ng/L — ABNORMAL HIGH (ref 0–19)
Troponin T High Sensitivity: 39 ng/L — ABNORMAL HIGH (ref 0–19)
Troponin T High Sensitivity: 31 ng/L — ABNORMAL HIGH (ref 0–19)
Troponin T High Sensitivity: 32 ng/L — ABNORMAL HIGH (ref 0–19)

## 2024-08-07 LAB — CBC
HCT: 43.7 % (ref 39.0–52.0)
Hemoglobin: 13.8 g/dL (ref 13.0–17.0)
MCH: 26.2 pg (ref 26.0–34.0)
MCHC: 31.6 g/dL (ref 30.0–36.0)
MCV: 83.1 fL (ref 80.0–100.0)
Platelets: 316 K/uL (ref 150–400)
RBC: 5.26 MIL/uL (ref 4.22–5.81)
RDW: 17.2 % — ABNORMAL HIGH (ref 11.5–15.5)
WBC: 11.9 K/uL — ABNORMAL HIGH (ref 4.0–10.5)
nRBC: 0 % (ref 0.0–0.2)

## 2024-08-07 LAB — PROTIME-INR
INR: 1 (ref 0.8–1.2)
Prothrombin Time: 13.4 s (ref 11.4–15.2)

## 2024-08-07 LAB — BASIC METABOLIC PANEL WITH GFR
Anion gap: 15 (ref 5–15)
BUN: 20 mg/dL (ref 8–23)
CO2: 22 mmol/L (ref 22–32)
Calcium: 9.5 mg/dL (ref 8.9–10.3)
Chloride: 105 mmol/L (ref 98–111)
Creatinine, Ser: 1.13 mg/dL (ref 0.61–1.24)
GFR, Estimated: >60 mL/min (ref >60)
Glucose, Bld: 132 mg/dL — ABNORMAL HIGH (ref 70–99)
Potassium: 4.2 mmol/L (ref 3.5–5.1)
Sodium: 141 mmol/L (ref 135–145)

## 2024-08-07 LAB — MAGNESIUM: Magnesium: 2.2 mg/dL (ref 1.7–2.4)

## 2024-08-07 LAB — D-DIMER, QUANTITATIVE: D-Dimer, Quant: 1.09 ug{FEU}/mL — ABNORMAL HIGH (ref 0.00–0.50)

## 2024-08-07 LAB — TSH: TSH: 0.322 u[IU]/mL — ABNORMAL LOW (ref 0.350–4.500)

## 2024-08-07 LAB — RESP PANEL BY RT-PCR (RSV, FLU A&B, COVID)  RVPGX2
Influenza A by PCR: NEGATIVE
Influenza B by PCR: NEGATIVE
Resp Syncytial Virus by PCR: NEGATIVE
SARS Coronavirus 2 by RT PCR: NEGATIVE

## 2024-08-07 LAB — CBG MONITORING, ED
Glucose-Capillary: 113 mg/dL — ABNORMAL HIGH (ref 70–99)
Glucose-Capillary: 137 mg/dL — ABNORMAL HIGH (ref 70–99)

## 2024-08-07 LAB — LACTIC ACID, PLASMA
Lactic Acid, Venous: 1.1 mmol/L (ref 0.5–1.9)
Lactic Acid, Venous: 0.8 mmol/L (ref 0.5–1.9)

## 2024-08-07 LAB — PROCALCITONIN: Procalcitonin: <0.1 ng/mL

## 2024-08-07 LAB — CK: Total CK: 108 U/L (ref 49–397)

## 2024-08-07 LAB — PHOSPHORUS: Phosphorus: 3 mg/dL (ref 2.5–4.6)

## 2024-08-07 MED ORDER — FINASTERIDE 5 MG PO TABS
5.0000 mg | ORAL_TABLET | Freq: Every day | ORAL | Status: DC
  Administered 2024-08-07 – 2024-08-09 (×3): 5 mg via ORAL
  Filled 2024-08-07 (×3): qty 1

## 2024-08-07 MED ORDER — FENTANYL CITRATE (PF) 50 MCG/ML IJ SOSY
50.0000 ug | PREFILLED_SYRINGE | Freq: Once | INTRAMUSCULAR | Status: DC
  Filled 2024-08-07: qty 1

## 2024-08-07 MED ORDER — FENTANYL CITRATE (PF) 50 MCG/ML IJ SOSY: 12.5000 ug | PREFILLED_SYRINGE | INTRAMUSCULAR | Status: DC | PRN

## 2024-08-07 MED ORDER — SODIUM CHLORIDE 0.9 % IV SOLN: INTRAVENOUS | Status: AC

## 2024-08-07 MED ORDER — ONDANSETRON HCL 4 MG PO TABS: 4.0000 mg | ORAL_TABLET | Freq: Four times a day (QID) | ORAL | Status: DC | PRN

## 2024-08-07 MED ORDER — PANTOPRAZOLE SODIUM 40 MG IV SOLR
40.0000 mg | Freq: Two times a day (BID) | INTRAVENOUS | Status: DC
  Administered 2024-08-07 – 2024-08-10 (×6): 40 mg via INTRAVENOUS
  Filled 2024-08-07 (×6): qty 10

## 2024-08-07 MED ORDER — ONDANSETRON HCL 4 MG/2ML IJ SOLN
4.0000 mg | Freq: Four times a day (QID) | INTRAMUSCULAR | Status: DC | PRN
  Filled 2024-08-07: qty 2

## 2024-08-07 MED ORDER — SODIUM CHLORIDE 0.9 % IV SOLN: 2.0000 g | Freq: Once | INTRAVENOUS | Status: DC

## 2024-08-07 MED ORDER — HYDROCODONE-ACETAMINOPHEN 5-325 MG PO TABS
1.0000 | ORAL_TABLET | ORAL | Status: DC | PRN
  Administered 2024-08-08: 1 via ORAL
  Administered 2024-08-09: 2 via ORAL
  Filled 2024-08-07 (×2): qty 2
  Filled 2024-08-07: qty 1

## 2024-08-07 MED ORDER — IOHEXOL 350 MG/ML SOLN
100.0000 mL | Freq: Once | INTRAVENOUS | Status: AC | PRN
  Administered 2024-08-07: 100 mL via INTRAVENOUS

## 2024-08-07 MED ORDER — IOHEXOL 350 MG/ML SOLN: 75.0000 mL | Freq: Once | INTRAVENOUS | Status: DC | PRN

## 2024-08-07 MED ORDER — CARBIDOPA-LEVODOPA 25-100 MG PO TABS
1.0000 | ORAL_TABLET | Freq: Three times a day (TID) | ORAL | Status: DC
  Administered 2024-08-08 – 2024-08-10 (×8): 1 via ORAL
  Filled 2024-08-07 (×8): qty 1

## 2024-08-07 MED ORDER — DIPHENHYDRAMINE HCL 25 MG PO CAPS
50.0000 mg | ORAL_CAPSULE | Freq: Once | ORAL | Status: AC
  Filled 2024-08-07: qty 2

## 2024-08-07 MED ORDER — METRONIDAZOLE 500 MG/100ML IV SOLN
500.0000 mg | Freq: Once | INTRAVENOUS | Status: AC
  Administered 2024-08-07: 500 mg via INTRAVENOUS
  Filled 2024-08-07: qty 100

## 2024-08-07 MED ORDER — HYDROMORPHONE HCL 1 MG/ML IJ SOLN
0.5000 mg | INTRAMUSCULAR | Status: DC | PRN
  Administered 2024-08-07 – 2024-08-09 (×6): 0.5 mg via INTRAVENOUS
  Filled 2024-08-07: qty 1
  Filled 2024-08-07 (×7): qty 0.5

## 2024-08-07 MED ORDER — ALBUTEROL SULFATE (2.5 MG/3ML) 0.083% IN NEBU: 2.5000 mg | INHALATION_SOLUTION | Freq: Four times a day (QID) | RESPIRATORY_TRACT | Status: DC | PRN

## 2024-08-07 MED ORDER — BUDESON-GLYCOPYRROL-FORMOTEROL 160-9-4.8 MCG/ACT IN AERO
2.0000 | INHALATION_SPRAY | Freq: Two times a day (BID) | RESPIRATORY_TRACT | Status: DC
  Administered 2024-08-08 (×2): 2 via RESPIRATORY_TRACT
  Filled 2024-08-07: qty 5.9

## 2024-08-07 MED ORDER — HYDROMORPHONE HCL 1 MG/ML IJ SOLN
0.5000 mg | Freq: Once | INTRAMUSCULAR | Status: AC
  Administered 2024-08-07: 0.5 mg via INTRAVENOUS
  Filled 2024-08-07: qty 1

## 2024-08-07 MED ORDER — GABAPENTIN 100 MG PO CAPS
100.0000 mg | ORAL_CAPSULE | Freq: Three times a day (TID) | ORAL | Status: DC
  Administered 2024-08-07 – 2024-08-10 (×8): 100 mg via ORAL
  Filled 2024-08-07 (×8): qty 1

## 2024-08-07 MED ORDER — SODIUM CHLORIDE 0.9 % IV SOLN
2.0000 g | Freq: Three times a day (TID) | INTRAVENOUS | Status: DC
  Administered 2024-08-08 – 2024-08-10 (×8): 2 g via INTRAVENOUS
  Filled 2024-08-07 (×9): qty 12.5

## 2024-08-07 MED ORDER — LEVOTHYROXINE SODIUM 75 MCG PO TABS
75.0000 ug | ORAL_TABLET | Freq: Every day | ORAL | Status: DC
  Administered 2024-08-08 – 2024-08-10 (×3): 75 ug via ORAL
  Filled 2024-08-07 (×3): qty 1

## 2024-08-07 MED ORDER — SODIUM CHLORIDE 0.9 % IV SOLN
2.0000 g | Freq: Once | INTRAVENOUS | Status: AC
  Administered 2024-08-07: 2 g via INTRAVENOUS
  Filled 2024-08-07: qty 12.5

## 2024-08-07 MED ORDER — CLOPIDOGREL BISULFATE 75 MG PO TABS: 75.0000 mg | ORAL_TABLET | Freq: Every day | ORAL | Status: DC

## 2024-08-07 MED ORDER — MONTELUKAST SODIUM 10 MG PO TABS
10.0000 mg | ORAL_TABLET | Freq: Every day | ORAL | Status: DC
  Administered 2024-08-08: 10 mg via ORAL
  Filled 2024-08-07 (×3): qty 1

## 2024-08-07 MED ORDER — TAMSULOSIN HCL 0.4 MG PO CAPS
0.4000 mg | ORAL_CAPSULE | Freq: Every day | ORAL | Status: DC
  Administered 2024-08-07 – 2024-08-09 (×3): 0.4 mg via ORAL
  Filled 2024-08-07 (×3): qty 1

## 2024-08-07 MED ORDER — INSULIN ASPART 100 UNIT/ML IJ SOLN
0.0000 [IU] | INTRAMUSCULAR | Status: DC
  Administered 2024-08-07: 1 [IU] via SUBCUTANEOUS
  Filled 2024-08-07: qty 1

## 2024-08-07 MED ORDER — DULOXETINE HCL 60 MG PO CPEP
60.0000 mg | ORAL_CAPSULE | Freq: Every morning | ORAL | Status: DC
  Administered 2024-08-08 – 2024-08-10 (×3): 60 mg via ORAL
  Filled 2024-08-07 (×3): qty 1

## 2024-08-07 MED ORDER — ACETAMINOPHEN 325 MG PO TABS: 650.0000 mg | ORAL_TABLET | Freq: Four times a day (QID) | ORAL | Status: DC | PRN

## 2024-08-07 MED ORDER — HYDROCORTISONE SOD SUC (PF) 100 MG IJ SOLR
200.0000 mg | Freq: Once | INTRAMUSCULAR | Status: AC
  Administered 2024-08-07: 200 mg via INTRAVENOUS
  Filled 2024-08-07: qty 4

## 2024-08-07 MED ORDER — HYDROMORPHONE HCL 1 MG/ML IJ SOLN
1.0000 mg | Freq: Once | INTRAMUSCULAR | Status: AC
  Administered 2024-08-07: 1 mg via INTRAVENOUS
  Filled 2024-08-07: qty 1

## 2024-08-07 MED ORDER — LEVOTHYROXINE SODIUM 50 MCG PO TABS: 75.0000 ug | ORAL_TABLET | Freq: Every day | ORAL | Status: DC

## 2024-08-07 MED ORDER — DIPHENHYDRAMINE HCL 50 MG/ML IJ SOLN
50.0000 mg | Freq: Once | INTRAMUSCULAR | Status: AC
  Administered 2024-08-07: 50 mg via INTRAVENOUS
  Filled 2024-08-07: qty 1

## 2024-08-07 MED ORDER — FENTANYL CITRATE (PF) 50 MCG/ML IJ SOSY
50.0000 ug | PREFILLED_SYRINGE | Freq: Once | INTRAMUSCULAR | Status: AC
  Administered 2024-08-07: 50 ug via INTRAVENOUS
  Filled 2024-08-07: qty 1

## 2024-08-07 MED ORDER — METRONIDAZOLE 500 MG/100ML IV SOLN
500.0000 mg | Freq: Two times a day (BID) | INTRAVENOUS | Status: DC
  Administered 2024-08-08 – 2024-08-10 (×5): 500 mg via INTRAVENOUS
  Filled 2024-08-07 (×5): qty 100

## 2024-08-07 MED ORDER — ACETAMINOPHEN 650 MG RE SUPP: 650.0000 mg | Freq: Four times a day (QID) | RECTAL | Status: DC | PRN

## 2024-08-07 NOTE — ED Notes (Signed)
 Patient requesting more pain medication. Dr. Dreama notified.

## 2024-08-07 NOTE — ED Triage Notes (Signed)
 PT arrives arrives via POV. PT reports waking up with chest pain and sob this morning. He also reports intermittent cough as well. PT is AxOx4.

## 2024-08-07 NOTE — ED Notes (Signed)
 Patient requesting pain medication. Dr. Dreama notified.

## 2024-08-07 NOTE — ED Provider Triage Note (Signed)
 Emergency Medicine Provider Triage Evaluation Note  Jared [name] , a 73 y.o. male  was evaluated in triage.  Pt complains of 8/8:30am woke up with CP that is sternal, non radiating. Nothing makes it worse or better.   Rarely has CP. SOB but not nauseated. Coughing but no hemoptysis.   Review of Systems  Positive: Cough, sob, cp Negative: Fever   Physical Exam  BP (!) 119/94   Pulse (!) 105   Temp 98.3 F (36.8 C) (Oral)   Resp 18   SpO2 96%  Gen:   Awake, no distress   Resp:  Normal effort  MSK:   Moves extremities without difficulty  Other:  Faint crackles LL base  Medical Decision Making  Medically screening exam initiated at 11:05 AM.  Appropriate orders placed.  Jared [name] was informed that the remainder of the evaluation will be completed by another provider, this initial triage assessment does not replace that evaluation, and the importance of remaining in the ED until their evaluation is complete.  Xray, ecg, labs   Jared [name], Jared [name] 08/07/24 [id number]

## 2024-08-07 NOTE — Subjective & Objective (Signed)
 Presents for multiple complaints including chest pain back pain has been having a bit some cough back pain has been somewhat chronic.  Patient has been admitted in October for diverticulitis which was difficult to manage and failed outpatient treatment Patient reports that he woke up with persistent sharp chest pain which was midsternal not associated with any nausea vomiting or shortness of breath does has known history of CAD patient is on Plavix  tried to use Pepto-Bismol but did not seem to help last cardiac cath was in March Been having episodic shortness of breath but no fevers Has known history of asthma  Patient also endorsing continued abdominal pain despite being hospitalized 2 weeks ago for diverticulitis As well as left thoracic back pain denies any wheezing

## 2024-08-07 NOTE — Assessment & Plan Note (Signed)
Chronic stable continue home meds

## 2024-08-07 NOTE — Consult Note (Signed)
 Reason for Consult: Diverticulitis Referring Physician: Dr. Silvester [name] is an 73 y.o. male.  HPI: Patient is a 73 year old male who comes in today secondary to abdominal pain.  Patient states he was recently here approximately 2 weeks ago with diverticulitis.  He was admitted given IV antibiotics.  He states that he was discharged Thursday on antibiotics.  He states that he began having more pain and discomfort over the weekend.  He came back secondary to ongoing pain and discomfort.  Patient underwent CT scan which showed a questionable large diverticulum versus wall abscess.  Patient was admitted for IV antibiotics and general surgery was consulted.  Patient had a previous open nephrectomy with a large transverse incision in the upper portion of his abdomen.  Past Medical History:  Diagnosis Date   Anxiety    Asthma    Cervical vertebral fusion 05/02/2014   Now presenting with C5-6 radiculopathy   Coronary artery disease    COVID 01/2020   pain all over monoclonal antibodies given all symptoms resolved   GERD (gastroesophageal reflux disease)    H/O heart artery stent 2000   to right rca   Heart attack (HCC) 2009   History of depression 03/19/2021   History of esophageal stricture    History of spinal fracture 2015   Hyperlipidemia    Hypertension    Hypothyroidism    Neuroleptic induced parkinsonism    left arm tremor   Osteoarthritis    lower back   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   Renal cell carcinoma 1997   Left kidney   Sleep apnea    wears CPAP   Sleep talking    Snoring 12/25/2013   Wears glasses     Past Surgical History:  Procedure Laterality Date   ABDOMINAL AORTOGRAM W/LOWER EXTREMITY N/A 10/19/2023   Procedure: ABDOMINAL AORTOGRAM W/LOWER EXTREMITY;  Surgeon: Court Dorn JINNY, MD;  Location: [facility];  Service: Cardiovascular;  Laterality: N/A;   ANTERIOR CERVICAL DECOMP/DISCECTOMY FUSION N/A 08/27/2014   Procedure:  Cervical six-seven anterior cervical decompression fusion with removal of hardware at Cervical five-six;  Surgeon: Fairy Levels, MD;  Location: [facility];  Service: Neurosurgery;  Laterality: N/A;  Cervical six-seven anterior cervical decompression fusion with removal of hardware at Cervical five-six   BACK SURGERY     1992, 2013 lower back   CARDIAC CATHETERIZATION  05/10/2007   Minimal CAD, normal LV systolic function, medical management   CARDIAC CATHETERIZATION  04/08/2008   RCA ulcerated 70-80% stenosis, stented with a 3x71mm Endeavor stent at 13atm for 50sec, reduced from 80% ulcerated stenosis to 0%.   CARDIAC CATHETERIZATION  05/07/2009   50% distal left main disease-IVUS or flow wire too dangerous in particular setting to perform intervention.   CARDIAC CATHETERIZATION  03/18/2010   Medical management   CARDIOVASCULAR STRESS TEST  02/09/2012   Normal, no significant wall abnormalities noted   CARPAL TUNNEL RELEASE Bilateral 2000   COLONOSCOPY     COLONOSCOPY W/ BIOPSIES AND POLYPECTOMY     COLONOSCOPY WITH PROPOFOL   06/01/2021   Elspeth [facility] at [facility]   CORONARY STENT PLACEMENT     FEMUR FRACTURE SURGERY Left 1995   HERNIA REPAIR     as infant groin   Left Hip Surgery Left 02/28/2024   NECK SURGERY  2005   NEPHRECTOMY Left 1997   secondary to cancer   PERIPHERAL VASCULAR BALLOON ANGIOPLASTY  10/19/2023   Procedure: PERIPHERAL VASCULAR BALLOON ANGIOPLASTY;  Surgeon:  Court Dorn PARAS, MD;  Location: [facility];  Service: Cardiovascular;;   TOTAL KNEE ARTHROPLASTY Left 2005   TOTAL KNEE ARTHROPLASTY Right 04/13/2018   Procedure: RIGHT TOTAL KNEE ARTHROPLASTY;  Surgeon: Kay Kemps, MD;  Location: [facility];  Service: Orthopedics;  Laterality: Right;   TRANSURETHRAL RESECTION OF PROSTATE N/A 03/25/2021   Procedure: TRANSURETHRAL RESECTION OF THE PROSTATE (TURP);  Surgeon: Matilda Senior, MD;  Location: [facility];  Service: Urology;  Laterality:  N/A;    Family History  Problem Relation Age of Onset   Asthma Mother    Heart disease Mother    Hypertension Mother    Thyroid  disease Mother    Esophageal cancer Father    Barrett's esophagus Father    Bone cancer Father        mets from esophagus   Heart disease Father    Cancer Father    Depression Father    Anxiety disorder Father    Alcoholism Father    Obesity Father    Diabetes Sister    Cancer Sister        Cervical cancer   Colon cancer Maternal Uncle    Heart disease Paternal Grandfather    Heart attack Paternal Grandfather 11   Stomach cancer Neg Hx    Colon polyps Neg Hx    Rectal cancer Neg Hx     Social History:  reports that he has never smoked. He has never used smokeless tobacco. He reports that he does not currently use alcohol . He reports that he does not use drugs.  Allergies:  Allergies  Allergen Reactions   Azithromycin  Anaphylaxis and Other (See Comments)    Ulcers in mouth, nose, and ears - also   Iodinated Contrast Media Hives   Nsaids Other (See Comments)    Was told not to take due to pt only having 1 kidney   Penicillins Hives, Itching, Nausea And Vomiting and Other (See Comments)    Tolerates Cefepime    Iohexol  Hives, Itching and Other (See Comments)    Per radiologist Dr. Scott - pt needs 13 hr prep. Pt was seen outpatient and given oral iohexol , broke out in hives and itching. Dr. Fujinaga was consulted 07/15/24   Codeine Nausea Only and Nausea And Vomiting   Penicillamine Rash   Pholcodine Rash   Statins Other (See Comments)    Myalgias   Sulfonamide Derivatives Hives and Rash    Medications: I have reviewed the patient's current medications.  Results for orders placed or performed during the hospital encounter of 08/07/24 (from the past 48 hours)  D-dimer, quantitative     Status: Abnormal   Collection Time: 08/07/24 10:31 AM  Result Value Ref Range   D-Dimer, Quant 1.09 (H) 0.00 - 0.50 ug/mL-FEU    Comment: (NOTE) At  the manufacturer cut-off value of 0.5 g/mL FEU, this assay has a negative predictive value of 95-100%.This assay is intended for use in conjunction with a clinical pretest probability (PTP) assessment model to exclude pulmonary embolism (PE) and deep venous thrombosis (DVT) in outpatients suspected of PE or DVT. Results should be correlated with clinical presentation. Performed at [facility], [address] [place], [place] [address]   Basic metabolic panel     Status: Abnormal   Collection Time: 08/07/24 10:35 AM  Result Value Ref Range   Sodium 141 135 - 145 mmol/L   Potassium 4.2 3.5 - 5.1 mmol/L   Chloride 105 98 - 111 mmol/L  CO2 22 22 - 32 mmol/L   Glucose, Bld 132 (H) 70 - 99 mg/dL    Comment: Glucose reference range applies only to samples taken after fasting for at least 8 hours.   BUN 20 8 - 23 mg/dL   Creatinine, Ser 8.86 0.61 - 1.24 mg/dL   Calcium  9.5 8.9 - 10.3 mg/dL   GFR, Estimated >39 >39 mL/min    Comment: (NOTE) Calculated using the CKD-EPI Creatinine Equation (2021)    Anion gap 15 5 - 15    Comment: Performed at [facility], [address], [place], [place] [address]  CBC     Status: Abnormal   Collection Time: 08/07/24 10:35 AM  Result Value Ref Range   WBC 11.9 (H) 4.0 - 10.5 K/uL   RBC 5.26 4.22 - 5.81 MIL/uL   Hemoglobin 13.8 13.0 - 17.0 g/dL   HCT 56.2 60.9 - 47.9 %   MCV 83.1 80.0 - 100.0 fL   MCH 26.2 26.0 - 34.0 pg   MCHC 31.6 30.0 - 36.0 g/dL   RDW 82.7 (H) 88.4 - 84.4 %   Platelets 316 150 - 400 K/uL   nRBC 0.0 0.0 - 0.2 %    Comment: Performed at [facility], [address] [place], [place] [address]  Troponin T, High Sensitivity     Status: Abnormal   Collection Time: 08/07/24 10:35 AM  Result Value Ref Range   Troponin T High Sensitivity 33 (H) 0 - 19 ng/L    Comment: (NOTE) Biotin concentrations > 1000 ng/mL falsely decrease TnT results.  Serial cardiac troponin  measurements are suggested.  Refer to the Links section for chest pain algorithms and additional  guidance. Performed at [facility], [address] [place], [place] [address]   Resp panel by RT-PCR (RSV, Flu A&B, Covid) Anterior Nasal Swab     Status: None   Collection Time: 08/07/24  1:01 PM   Specimen: Anterior Nasal Swab  Result Value Ref Range   SARS Coronavirus 2 by RT PCR NEGATIVE NEGATIVE    Comment: (NOTE) SARS-CoV-2 target nucleic acids are NOT DETECTED.  The SARS-CoV-2 RNA is generally detectable in upper respiratory specimens during the acute phase of infection. The lowest concentration of SARS-CoV-2 viral copies this assay can detect is 138 copies/mL. A negative result does not preclude SARS-Cov-2 infection and should not be used as the sole basis for treatment or other patient management decisions. A negative result may occur with  improper specimen collection/handling, submission of specimen other than nasopharyngeal swab, presence of viral mutation(s) within the areas targeted by this assay, and inadequate number of viral copies(<138 copies/mL). A negative result must be combined with clinical observations, patient history, and epidemiological information. The expected result is Negative.  Fact Sheet for Patients:  bloggercourse.com  Fact Sheet for Healthcare Providers:  seriousbroker.it  This test is no t yet approved or cleared by the [place]  FDA and  has been authorized for detection and/or diagnosis of SARS-CoV-2 by FDA under an Emergency Use Authorization (EUA). This EUA will remain  in effect (meaning this test can be used) for the duration of the COVID-19 declaration under Section 564(b)(1) of the Act, 21 U.S.C.section 360bbb-3(b)(1), unless the authorization is terminated  or revoked sooner.       Influenza A by PCR NEGATIVE NEGATIVE   Influenza B by PCR NEGATIVE NEGATIVE     Comment: (NOTE) The Xpert Xpress SARS-CoV-2/FLU/RSV plus assay is intended as an  aid in the diagnosis of influenza from Nasopharyngeal swab specimens and should not be used as a sole basis for treatment. Nasal washings and aspirates are unacceptable for Xpert Xpress SARS-CoV-2/FLU/RSV testing.  Fact Sheet for Patients: bloggercourse.com  Fact Sheet for Healthcare Providers: seriousbroker.it  This test is not yet approved or cleared by the [place]  FDA and has been authorized for detection and/or diagnosis of SARS-CoV-2 by FDA under an Emergency Use Authorization (EUA). This EUA will remain in effect (meaning this test can be used) for the duration of the COVID-19 declaration under Section 564(b)(1) of the Act, 21 U.S.C. section 360bbb-3(b)(1), unless the authorization is terminated or revoked.     Resp Syncytial Virus by PCR NEGATIVE NEGATIVE    Comment: (NOTE) Fact Sheet for Patients: bloggercourse.com  Fact Sheet for Healthcare Providers: seriousbroker.it  This test is not yet approved or cleared by the [place]  FDA and has been authorized for detection and/or diagnosis of SARS-CoV-2 by FDA under an Emergency Use Authorization (EUA). This EUA will remain in effect (meaning this test can be used) for the duration of the COVID-19 declaration under Section 564(b)(1) of the Act, 21 U.S.C. section 360bbb-3(b)(1), unless the authorization is terminated or revoked.  Performed at [facility], [address] [place], [place] [address]   Troponin T, High Sensitivity     Status: Abnormal   Collection Time: 08/07/24  1:01 PM  Result Value Ref Range   Troponin T High Sensitivity 39 (H) 0 - 19 ng/L    Comment: (NOTE) Biotin concentrations > 1000 ng/mL falsely decrease TnT results.  Serial cardiac troponin measurements are suggested.  Refer to the  Links section for chest pain algorithms and additional  guidance. Performed at [facility], [address], [place], [place] [address]   Troponin T, High Sensitivity     Status: Abnormal   Collection Time: 08/07/24  2:54 PM  Result Value Ref Range   Troponin T High Sensitivity 31 (H) 0 - 19 ng/L    Comment: (NOTE) Biotin concentrations > 1000 ng/mL falsely decrease TnT results.  Serial cardiac troponin measurements are suggested.  Refer to the Links section for chest pain algorithms and additional  guidance. Performed at [facility], [address], [place], [place] [address]   Troponin T, High Sensitivity     Status: Abnormal   Collection Time: 08/07/24  6:24 PM  Result Value Ref Range   Troponin T High Sensitivity 32 (H) 0 - 19 ng/L    Comment: (NOTE) Biotin concentrations > 1000 ng/mL falsely decrease TnT results.  Serial cardiac troponin measurements are suggested.  Refer to the Links section for chest pain algorithms and additional  guidance. Performed at [facility], [address], [place], [place] [address]     CT Angio Chest PE W and/or Wo Contrast Result Date: 08/07/2024 CLINICAL DATA:  Acute abdominal pain.  High probability for PE. EXAM: CT ANGIOGRAPHY CHEST CT ABDOMEN AND PELVIS WITH CONTRAST TECHNIQUE: Multidetector CT imaging of the chest was performed using the standard protocol during bolus administration of intravenous contrast. Multiplanar CT image reconstructions and MIPs were obtained to evaluate the vascular anatomy. Multidetector CT imaging of the abdomen and pelvis was performed using the standard protocol during bolus administration of intravenous contrast. RADIATION DOSE REDUCTION: This exam was performed according to the departmental dose-optimization program which includes automated exposure control, adjustment of the mA and/or kV according to patient size and/or use of iterative reconstruction  technique. CONTRAST:  OMNIPAQUE  IOHEXOL  350 MG/ML SOLN COMPARISON:  CT abdomen and pelvis 07/18/2024.  CT chest 06/05/2024. FINDINGS: CTA CHEST FINDINGS Cardiovascular: Satisfactory opacification of the pulmonary arteries to the segmental level. No evidence of pulmonary embolism. Normal heart size. No pericardial effusion. Mediastinum/Nodes: No enlarged mediastinal, hilar, or axillary lymph nodes. Thyroid  gland, trachea, and esophagus demonstrate no significant findings. Lungs/Pleura: There is some new scattered pulmonary nodules in the left lower lobe and inferior left upper lobe measuring up to 5 mm. There is no focal lung infiltrate. Pleural effusion or pneumothorax. Musculoskeletal: Review of the MIP images confirms the above findings. CT ABDOMEN and PELVIS FINDINGS Hepatobiliary: There is a calcification in the left liver which is unchanged. No new liver lesions are seen. Gallbladder and bile ducts are within normal limits. Pancreas: Unremarkable. No pancreatic ductal dilatation or surrounding inflammatory changes. Spleen: Normal in size without focal abnormality. Adrenals/Urinary Tract: Left kidney is absent. Adrenal glands are within normal limits. Right renal cysts are present measuring up 2 4 cm. The bladder is within normal limits. Stomach/Bowel: There is sigmoid and descending colon diverticulosis. There is some wall thickening and mild inflammation of the distal descending colon concerning for acute diverticulitis. This is in his similar location when compared to the prior study. On coronal image there is questionable large diverticulum versus wall abscess on image 7/93 measuring 3.8 x 2.2 by 3.5 cm. There is no evidence for bowel obstruction or free air. The appendix appears normal. Small bowel and stomach are within normal limits. Vascular/Lymphatic: Aortic atherosclerosis. No enlarged abdominal or pelvic lymph nodes. Reproductive: Prostate is unremarkable. Other: There are small fat containing  bilateral inguinal hernias. Musculoskeletal: Lumbar spinal stimulator device present. Left hip arthroplasty present. No acute osseous abnormality. Review of the MIP images confirms the above findings. IMPRESSION: 1. No evidence for pulmonary embolism. 2. New scattered pulmonary nodules in the left lower lobe and inferior left upper lobe measuring up to 5 mm. Findings are likely infectious/inflammatory. No follow-up needed if patient is low-risk (and has no known or suspected primary neoplasm). Non-contrast chest CT can be considered in 12 months if patient is high-risk. This recommendation follows the consensus statement: Guidelines for Management of Incidental Pulmonary Nodules Detected on CT Images: From the Fleischner Society 2017; Radiology 2017; 284:228-243. 3. Findings compatible with acute diverticulitis of the distal descending colon. There is questionable large diverticulum versus wall abscess measuring 3.8 x 2.2 x 3.5 cm. No free air. 4. Aortic atherosclerosis. Aortic Atherosclerosis (ICD10-I70.0). Electronically Signed   By: Greig Pique M.D.   On: 08/07/2024 17:50   CT ABDOMEN PELVIS W CONTRAST Result Date: 08/07/2024 CLINICAL DATA:  Acute abdominal pain.  High probability for PE. EXAM: CT ANGIOGRAPHY CHEST CT ABDOMEN AND PELVIS WITH CONTRAST TECHNIQUE: Multidetector CT imaging of the chest was performed using the standard protocol during bolus administration of intravenous contrast. Multiplanar CT image reconstructions and MIPs were obtained to evaluate the vascular anatomy. Multidetector CT imaging of the abdomen and pelvis was performed using the standard protocol during bolus administration of intravenous contrast. RADIATION DOSE REDUCTION: This exam was performed according to the departmental dose-optimization program which includes automated exposure control, adjustment of the mA and/or kV according to patient size and/or use of iterative reconstruction technique. CONTRAST:  OMNIPAQUE   IOHEXOL  350 MG/ML SOLN COMPARISON:  CT abdomen and pelvis 07/18/2024.  CT chest 06/05/2024. FINDINGS: CTA CHEST FINDINGS Cardiovascular: Satisfactory opacification of the pulmonary arteries to the segmental level. No evidence of pulmonary embolism.  Normal heart size. No pericardial effusion. Mediastinum/Nodes: No enlarged mediastinal, hilar, or axillary lymph nodes. Thyroid  gland, trachea, and esophagus demonstrate no significant findings. Lungs/Pleura: There is some new scattered pulmonary nodules in the left lower lobe and inferior left upper lobe measuring up to 5 mm. There is no focal lung infiltrate. Pleural effusion or pneumothorax. Musculoskeletal: Review of the MIP images confirms the above findings. CT ABDOMEN and PELVIS FINDINGS Hepatobiliary: There is a calcification in the left liver which is unchanged. No new liver lesions are seen. Gallbladder and bile ducts are within normal limits. Pancreas: Unremarkable. No pancreatic ductal dilatation or surrounding inflammatory changes. Spleen: Normal in size without focal abnormality. Adrenals/Urinary Tract: Left kidney is absent. Adrenal glands are within normal limits. Right renal cysts are present measuring up 2 4 cm. The bladder is within normal limits. Stomach/Bowel: There is sigmoid and descending colon diverticulosis. There is some wall thickening and mild inflammation of the distal descending colon concerning for acute diverticulitis. This is in his similar location when compared to the prior study. On coronal image there is questionable large diverticulum versus wall abscess on image 7/93 measuring 3.8 x 2.2 by 3.5 cm. There is no evidence for bowel obstruction or free air. The appendix appears normal. Small bowel and stomach are within normal limits. Vascular/Lymphatic: Aortic atherosclerosis. No enlarged abdominal or pelvic lymph nodes. Reproductive: Prostate is unremarkable. Other: There are small fat containing bilateral inguinal hernias.  Musculoskeletal: Lumbar spinal stimulator device present. Left hip arthroplasty present. No acute osseous abnormality. Review of the MIP images confirms the above findings. IMPRESSION: 1. No evidence for pulmonary embolism. 2. New scattered pulmonary nodules in the left lower lobe and inferior left upper lobe measuring up to 5 mm. Findings are likely infectious/inflammatory. No follow-up needed if patient is low-risk (and has no known or suspected primary neoplasm). Non-contrast chest CT can be considered in 12 months if patient is high-risk. This recommendation follows the consensus statement: Guidelines for Management of Incidental Pulmonary Nodules Detected on CT Images: From the Fleischner Society 2017; Radiology 2017; 284:228-243. 3. Findings compatible with acute diverticulitis of the distal descending colon. There is questionable large diverticulum versus wall abscess measuring 3.8 x 2.2 x 3.5 cm. No free air. 4. Aortic atherosclerosis. Aortic Atherosclerosis (ICD10-I70.0). Electronically Signed   By: Greig Pique M.D.   On: 08/07/2024 17:50   DG Chest 2 View Result Date: 08/07/2024 CLINICAL DATA:  Chest pain. EXAM: CHEST - 2 VIEW COMPARISON:  06/05/2024. FINDINGS: The heart size and mediastinal contours are unchanged. Aortic atherosclerosis. Subsegmental atelectasis/scarring again noted in the right middle lobe. No overt edema, acute focal consolidation, pleural effusion, or pneumothorax. Lower cervical ACDF. No acute osseous abnormality. IMPRESSION: No acute cardiopulmonary findings. Electronically Signed   By: Harrietta Sherry M.D.   On: 08/07/2024 11:29    Review of Systems  Constitutional:  Negative for chills and fever.  HENT:  Negative for ear discharge, hearing loss and sore throat.   Eyes:  Negative for discharge.  Respiratory:  Negative for cough and shortness of breath.   Cardiovascular:  Negative for chest pain and leg swelling.  Gastrointestinal:  Negative for abdominal pain,  constipation, diarrhea, nausea and vomiting.  Musculoskeletal:  Negative for myalgias and neck pain.  Skin:  Negative for rash.  Allergic/Immunologic: Negative for environmental allergies.  Neurological:  Negative for dizziness and seizures.  Hematological:  Does not bruise/bleed easily.  Psychiatric/Behavioral:  Negative for suicidal ideas.   All other systems reviewed and are negative.  Blood pressure 116/85, pulse 84, temperature 98.2 F (36.8 C), temperature source Oral, resp. rate 19, height 5' 7 (1.702 m), weight 89.4 kg, SpO2 97%. Physical Exam Constitutional:      Appearance: He is well-developed.     Comments: Conversant No acute distress  HENT:     Head: Normocephalic and atraumatic.  Eyes:     General: Lids are normal. No scleral icterus.    Pupils: Pupils are equal, round, and reactive to light.     Comments: Pupils are equal round and reactive No lid lag Moist conjunctiva  Neck:     Thyroid : No thyromegaly.     Trachea: No tracheal tenderness.     Comments: No cervical lymphadenopathy Cardiovascular:     Rate and Rhythm: Normal rate and regular rhythm.     Heart sounds: No murmur heard. Pulmonary:     Effort: Pulmonary effort is normal.     Breath sounds: Normal breath sounds. No wheezing or rales.  Abdominal:     General: There is no abdominal bruit.     Tenderness: There is abdominal tenderness (LLQ).     Hernia: No hernia is present.  Musculoskeletal:     Cervical back: Normal range of motion and neck supple.  Skin:    General: Skin is warm.     Findings: No rash.     Nails: There is no clubbing.     Comments: Normal skin turgor  Neurological:     Mental Status: He is alert and oriented to person, place, and time.     Comments: Normal gait and station  Psychiatric:        Mood and Affect: Mood normal.        Thought Content: Thought content normal.        Judgment: Judgment normal.     Comments: Appropriate affect      Assessment/Plan: 73 year old male with acute diverticulitis, possible abscess 1.  Recommend medical admission 2.  IV antibiotics and n.p.o., hold plavix  3.  Discussed patient that this does not resolve he may require laparotomy, colectomy and colostomy. 4.  Will follow along.  HTN CKD COPD  Lynda Leos 08/07/2024, 8:19 PM

## 2024-08-07 NOTE — Assessment & Plan Note (Addendum)
-   Given abscess appreciate general surgery consult,    - Bowel rest  NPO  - Will rehydrate  - Continue IV antibiotics as patient was unable to tolerate p.o. due to significant nausea                  started on  cefepime   metronidazole  on 08/07/24  General surgery recommended 1.  Recommend medical admission 2.  IV antibiotics and n.p.o., hold plavix  3.  Discussed patient that this does not resolve he may require laparotomy, colectomy and colostomy. 4.  Will follow along.

## 2024-08-07 NOTE — Assessment & Plan Note (Addendum)
 Allow permissive hypertension

## 2024-08-07 NOTE — Assessment & Plan Note (Signed)
-  chronic avoid nephrotoxic medications such as NSAIDs, Vanco Zosyn combo,  avoid hypotension, continue to follow renal function

## 2024-08-07 NOTE — Assessment & Plan Note (Signed)
 Continue flomax and Proscar

## 2024-08-07 NOTE — Assessment & Plan Note (Signed)
 Troponin is stable, echo in am serial ECG  Email to cardiology

## 2024-08-07 NOTE — ED Provider Notes (Signed)
 [place] EMERGENCY DEPARTMENT AT [facility] Provider Note   CSN: [id number] Arrival date & time: 08/07/24  1020     Patient presents with: Chest Pain   Jared [name] is a 73 y.o. male.   HPI      73 year old male with a history of chronic orthostatic hypotension, coronary artery disease, carotid artery stenosis, peripheral arterial disease, hypothyroidism, Parkinson's, GERD, admission at the end of October with uncomplicated diverticulitis after failure of outpatient antibiotic therapy, who presents with concern for chest pain, dyspnea, continued abdominal discomfort.   Just saw spine Dr. On Monday, radicular right arm pain-has MRI scheduled  Woke up this AM with chest discomfort, thought it was acid reflux. Then 10-15 minutes later started having a cough, began coughing.   Left thoracic back pain, side going towards axilla, sharp pain. Not worse with deep breaths. Is having new shortness of breath today Back in March cardiologist did angioplasty for leg., no new leg pain or swelling No nausea or vomiting today No fever Cough nonproductive Has some congestion in am chronically normally not worse No sore throat  Has hx of allergies and asthma. This feels different. Not wheezing like asthma.  Was in the hospital for a week 2 weeks ago, diverticulitis, shingles Still having abdominal pain       Past Medical History:  Diagnosis Date   Anxiety    Asthma    Cervical vertebral fusion 05/02/2014   Now presenting with C5-6 radiculopathy   Coronary artery disease    COVID 01/2020   pain all over monoclonal antibodies given all symptoms resolved   GERD (gastroesophageal reflux disease)    H/O heart artery stent 2000   to right rca   Heart attack (HCC) 2009   History of depression 03/19/2021   History of esophageal stricture    History of spinal fracture 2015   Hyperlipidemia    Hypertension    Hypothyroidism    Neuroleptic induced parkinsonism     left arm tremor   Osteoarthritis    lower back   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 Authorizing Provider  albuterol  (PROVENTIL ) (2.5 MG/3ML) 0.083% nebulizer solution Take 3 mLs (2.5 mg total) by nebulization every 6 (six) hours as needed for wheezing or shortness of breath. 12/20/22  Yes Kozlow, Camellia JINNY, MD  albuterol  (VENTOLIN  HFA) 108 (90 Base) MCG/ACT inhaler Inhale 2 puffs into the lungs every 4 (four) hours as needed for wheezing or shortness of breath. Can inhale two puffs every four to six hours as needed for cough, wheeze, shortness of breath, or chest tightness. 02/14/23  Yes Padgett, Danita Macintosh, MD  aspirin  EC 81 MG tablet Take 1 tablet (81 mg total) by mouth daily. Swallow whole. 10/21/23  Yes Henry Shaver B, NP  carbidopa-levodopa (SINEMET IR) 25-100 MG tablet Take 1 tablet by mouth with breakfast, with lunch, and with evening meal.   Yes [provider]  clopidogrel  (PLAVIX ) 75 MG tablet TAKE 1 TABLET BY MOUTH DAILY WITH BREAKFAST 08/02/24  Yes Darryle Currier L, PA-C  Coenzyme Q10 (CO Q-10 PO) Take 1 capsule by mouth daily.   Yes [provider]  DULoxetine  (CYMBALTA ) 60 MG capsule Take 60 mg  by mouth every morning. 04/22/21  Yes [provider]  EPINEPHrine  0.3 mg/0.3 mL IJ SOAJ injection Inject 0.3 mg into the muscle as needed for anaphylaxis. 06/12/23  Yes [provider]  finasteride  (PROSCAR ) 5 MG tablet Take 5 mg by mouth at bedtime.   Yes [provider]  gabapentin  (NEURONTIN ) 100 MG capsule Take 100 mg by mouth 3 (three) times daily.   Yes [provider]  guaiFENesin  (ROBITUSSIN) 100 MG/5ML liquid Take 10 mLs by mouth every 6 (six) hours as needed for cough or to loosen phlegm. 07/18/24  Yes Raenelle Coria, MD  levothyroxine  (SYNTHROID ) 75 MCG tablet Take 75 mcg by mouth daily before breakfast.   Yes [provider]  montelukast  (SINGULAIR ) 10 MG tablet Take 10 mg by mouth at bedtime. 06/04/24  Yes [provider]  Multiple Vitamin (MULTIVITAMIN) tablet Take 1 tablet by mouth daily with breakfast.   Yes [provider]  NUCALA  100 MG/ML SOSY Inject 100 mg into the skin every 30 (thirty) days. 02/14/23  Yes [provider]  OVER THE COUNTER MEDICATION Place 1 drop into both eyes See admin instructions. Rohto Max Strength Redness Reliever/Redness & Dry Eye Symptom Relief Lubricant Eye Drops - Instill 1 drop into both eyes every 8 hours as needed for dryness   Yes [provider]  OZEMPIC, 1 MG/DOSE, 4 MG/3ML SOPN Inject 1 mg into the skin every Friday.   Yes [provider]  pantoprazole  (PROTONIX ) 40 MG tablet TAKE 1 TABLET BY MOUTH DAILY Patient taking differently: Take 40 mg by mouth at bedtime. 12/04/23  Yes Court Dorn PARAS, MD  polyethylene glycol (MIRALAX  / GLYCOLAX ) 17 g packet Take 17 g by mouth daily. 07/18/24  Yes Ghimire, Coria, MD  REPATHA  SURECLICK 140 MG/ML SOAJ INJECT 1 PEN INTO THE SKIN EVERY 14 DAYS Patient taking differently: Inject 140 mg into the skin every 14 (fourteen) days. 11/23/23  Yes Court Dorn PARAS, MD  sodium chloride  (OCEAN) 0.65 % SOLN nasal spray Place 1 spray into both nostrils as needed for congestion.   Yes [provider]  tamsulosin  (FLOMAX ) 0.4 MG CAPS capsule Take 0.4 mg by mouth at bedtime.   Yes [provider]  TRELEGY ELLIPTA 100-62.5-25 MCG/ACT AEPB Take 1 puff by mouth daily. 05/14/24  Yes [provider]  vitamin C  (ASCORBIC ACID ) 500 MG tablet Take 500 mg by mouth 2 (two) times daily.    Yes [provider]  zinc  gluconate 50 MG tablet Take 50 mg by mouth in the morning and at bedtime.   Yes [provider]    Allergies: Azithromycin , Iodinated contrast  media, Nsaids, Penicillins, Iohexol , Codeine, Penicillamine, Pholcodine, Statins, and Sulfonamide derivatives    Review of Systems  Updated Vital Signs BP 116/85   Pulse 84   Temp 98.2 F (36.8 C) (Oral)   Resp 19   Ht 5' 7 (1.702 m)   Wt 89.4 kg   SpO2 97%   BMI 30.87 kg/m   Physical Exam Vitals and nursing note reviewed.  Constitutional:      General: He is not in acute distress.    Appearance: He is well-developed. He is not diaphoretic.  HENT:     Head: Normocephalic and atraumatic.  Eyes:     Conjunctiva/sclera: Conjunctivae normal.  Cardiovascular:     Rate and Rhythm: Normal rate and regular rhythm.     Heart sounds: Normal heart sounds. No murmur heard.    No friction rub. No  gallop.  Pulmonary:     Effort: Pulmonary effort is normal. No respiratory distress.     Breath sounds: Normal breath sounds. No wheezing or rales.  Abdominal:     General: There is no distension.     Palpations: Abdomen is soft.     Tenderness: There is abdominal tenderness (LLQ LUQ). There is no guarding.  Musculoskeletal:     Cervical back: Normal range of motion.  Skin:    General: Skin is warm and dry.  Neurological:     Mental Status: He is alert and oriented to person, place, and time.     (all labs ordered are listed, but only abnormal results are displayed) Labs Reviewed  BASIC METABOLIC PANEL WITH GFR - Abnormal; Notable for the following components:      Result Value   Glucose, Bld 132 (*)    All other components within normal limits  CBC - Abnormal; Notable for the following components:   WBC 11.9 (*)    RDW 17.2 (*)    All other components within normal limits  D-DIMER, QUANTITATIVE - Abnormal; Notable for the following components:   D-Dimer, Quant 1.09 (*)    All other components within normal limits  TROPONIN T, HIGH SENSITIVITY - Abnormal; Notable for the following components:   Troponin T High Sensitivity 33 (*)    All other components within normal limits   TROPONIN T, HIGH SENSITIVITY - Abnormal; Notable for the following components:   Troponin T High Sensitivity 39 (*)    All other components within normal limits  TROPONIN T, HIGH SENSITIVITY - Abnormal; Notable for the following components:   Troponin T High Sensitivity 31 (*)    All other components within normal limits  TROPONIN T, HIGH SENSITIVITY - Abnormal; Notable for the following components:   Troponin T High Sensitivity 32 (*)    All other components within normal limits  RESP PANEL BY RT-PCR (RSV, FLU A&B, COVID)  [id number]    EKG: EKG Interpretation Date/Time:  Wednesday August 07 2024 10:31:48 EST Ventricular Rate:  108 PR Interval:  144 QRS Duration:  111 QT Interval:  337 QTC Calculation: 452 R Axis:   81  Text Interpretation: Sinus tachycardia Consider RVH w/ secondary repol abnormality Since prior ECG, rate has increased Confirmed by Dreama Longs ([id number]) on 08/07/2024 11:49:33 AM  Radiology: CT Angio Chest PE W and/or Wo Contrast Result Date: 08/07/2024 CLINICAL DATA:  Acute abdominal pain.  High probability for PE. EXAM: CT ANGIOGRAPHY CHEST CT ABDOMEN AND PELVIS WITH CONTRAST TECHNIQUE: Multidetector CT imaging of the chest was performed using the standard protocol during bolus administration of intravenous contrast. Multiplanar CT image reconstructions and MIPs were obtained to evaluate the vascular anatomy. Multidetector CT imaging of the abdomen and pelvis was performed using the standard protocol during bolus administration of intravenous contrast. RADIATION DOSE REDUCTION: This exam was performed according to the departmental dose-optimization program which includes automated exposure control, adjustment of the mA and/or kV according to patient size and/or use of iterative reconstruction technique. CONTRAST:  OMNIPAQUE  IOHEXOL  350 MG/ML SOLN COMPARISON:  CT abdomen and pelvis 07/18/2024.  CT chest 06/05/2024. FINDINGS: CTA CHEST FINDINGS Cardiovascular:  Satisfactory opacification of the pulmonary arteries to the segmental level. No evidence of pulmonary embolism. Normal heart size. No pericardial effusion. Mediastinum/Nodes: No enlarged mediastinal, hilar, or axillary lymph nodes. Thyroid  gland, trachea, and esophagus demonstrate no significant findings. Lungs/Pleura: There is some new scattered pulmonary nodules in the left lower lobe and inferior  left upper lobe measuring up to 5 mm. There is no focal lung infiltrate. Pleural effusion or pneumothorax. Musculoskeletal: Review of the MIP images confirms the above findings. CT ABDOMEN and PELVIS FINDINGS Hepatobiliary: There is a calcification in the left liver which is unchanged. No new liver lesions are seen. Gallbladder and bile ducts are within normal limits. Pancreas: Unremarkable. No pancreatic ductal dilatation or surrounding inflammatory changes. Spleen: Normal in size without focal abnormality. Adrenals/Urinary Tract: Left kidney is absent. Adrenal glands are within normal limits. Right renal cysts are present measuring up 2 4 cm. The bladder is within normal limits. Stomach/Bowel: There is sigmoid and descending colon diverticulosis. There is some wall thickening and mild inflammation of the distal descending colon concerning for acute diverticulitis. This is in his similar location when compared to the prior study. On coronal image there is questionable large diverticulum versus wall abscess on image 7/93 measuring 3.8 x 2.2 by 3.5 cm. There is no evidence for bowel obstruction or free air. The appendix appears normal. Small bowel and stomach are within normal limits. Vascular/Lymphatic: Aortic atherosclerosis. No enlarged abdominal or pelvic lymph nodes. Reproductive: Prostate is unremarkable. Other: There are small fat containing bilateral inguinal hernias. Musculoskeletal: Lumbar spinal stimulator device present. Left hip arthroplasty present. No acute osseous abnormality. Review of the MIP images  confirms the above findings. IMPRESSION: 1. No evidence for pulmonary embolism. 2. New scattered pulmonary nodules in the left lower lobe and inferior left upper lobe measuring up to 5 mm. Findings are likely infectious/inflammatory. No follow-up needed if patient is low-risk (and has no known or suspected primary neoplasm). Non-contrast chest CT can be considered in 12 months if patient is high-risk. This recommendation follows the consensus statement: Guidelines for Management of Incidental Pulmonary Nodules Detected on CT Images: From the Fleischner Society 2017; Radiology 2017; 284:228-243. 3. Findings compatible with acute diverticulitis of the distal descending colon. There is questionable large diverticulum versus wall abscess measuring 3.8 x 2.2 x 3.5 cm. No free air. 4. Aortic atherosclerosis. Aortic Atherosclerosis (ICD10-I70.0). Electronically Signed   By: Greig Pique M.D.   On: 08/07/2024 17:50   CT ABDOMEN PELVIS W CONTRAST Result Date: 08/07/2024 CLINICAL DATA:  Acute abdominal pain.  High probability for PE. EXAM: CT ANGIOGRAPHY CHEST CT ABDOMEN AND PELVIS WITH CONTRAST TECHNIQUE: Multidetector CT imaging of the chest was performed using the standard protocol during bolus administration of intravenous contrast. Multiplanar CT image reconstructions and MIPs were obtained to evaluate the vascular anatomy. Multidetector CT imaging of the abdomen and pelvis was performed using the standard protocol during bolus administration of intravenous contrast. RADIATION DOSE REDUCTION: This exam was performed according to the departmental dose-optimization program which includes automated exposure control, adjustment of the mA and/or kV according to patient size and/or use of iterative reconstruction technique. CONTRAST:  OMNIPAQUE  IOHEXOL  350 MG/ML SOLN COMPARISON:  CT abdomen and pelvis 07/18/2024.  CT chest 06/05/2024. FINDINGS: CTA CHEST FINDINGS Cardiovascular: Satisfactory opacification of the  pulmonary arteries to the segmental level. No evidence of pulmonary embolism. Normal heart size. No pericardial effusion. Mediastinum/Nodes: No enlarged mediastinal, hilar, or axillary lymph nodes. Thyroid  gland, trachea, and esophagus demonstrate no significant findings. Lungs/Pleura: There is some new scattered pulmonary nodules in the left lower lobe and inferior left upper lobe measuring up to 5 mm. There is no focal lung infiltrate. Pleural effusion or pneumothorax. Musculoskeletal: Review of the MIP images confirms the above findings. CT ABDOMEN and PELVIS FINDINGS Hepatobiliary: There is a calcification in the  left liver which is unchanged. No new liver lesions are seen. Gallbladder and bile ducts are within normal limits. Pancreas: Unremarkable. No pancreatic ductal dilatation or surrounding inflammatory changes. Spleen: Normal in size without focal abnormality. Adrenals/Urinary Tract: Left kidney is absent. Adrenal glands are within normal limits. Right renal cysts are present measuring up 2 4 cm. The bladder is within normal limits. Stomach/Bowel: There is sigmoid and descending colon diverticulosis. There is some wall thickening and mild inflammation of the distal descending colon concerning for acute diverticulitis. This is in his similar location when compared to the prior study. On coronal image there is questionable large diverticulum versus wall abscess on image 7/93 measuring 3.8 x 2.2 by 3.5 cm. There is no evidence for bowel obstruction or free air. The appendix appears normal. Small bowel and stomach are within normal limits. Vascular/Lymphatic: Aortic atherosclerosis. No enlarged abdominal or pelvic lymph nodes. Reproductive: Prostate is unremarkable. Other: There are small fat containing bilateral inguinal hernias. Musculoskeletal: Lumbar spinal stimulator device present. Left hip arthroplasty present. No acute osseous abnormality. Review of the MIP images confirms the above findings.  IMPRESSION: 1. No evidence for pulmonary embolism. 2. New scattered pulmonary nodules in the left lower lobe and inferior left upper lobe measuring up to 5 mm. Findings are likely infectious/inflammatory. No follow-up needed if patient is low-risk (and has no known or suspected primary neoplasm). Non-contrast chest CT can be considered in 12 months if patient is high-risk. This recommendation follows the consensus statement: Guidelines for Management of Incidental Pulmonary Nodules Detected on CT Images: From the Fleischner Society 2017; Radiology 2017; 284:228-243. 3. Findings compatible with acute diverticulitis of the distal descending colon. There is questionable large diverticulum versus wall abscess measuring 3.8 x 2.2 x 3.5 cm. No free air. 4. Aortic atherosclerosis. Aortic Atherosclerosis (ICD10-I70.0). Electronically Signed   By: Greig Pique M.D.   On: 08/07/2024 17:50   DG Chest 2 View Result Date: 08/07/2024 CLINICAL DATA:  Chest pain. EXAM: CHEST - 2 VIEW COMPARISON:  06/05/2024. FINDINGS: The heart size and mediastinal contours are unchanged. Aortic atherosclerosis. Subsegmental atelectasis/scarring again noted in the right middle lobe. No overt edema, acute focal consolidation, pleural effusion, or pneumothorax. Lower cervical ACDF. No acute osseous abnormality. IMPRESSION: No acute cardiopulmonary findings. Electronically Signed   By: Harrietta Sherry M.D.   On: 08/07/2024 11:29     Procedures   Medications Ordered in the ED  fentaNYL  (SUBLIMAZE ) injection 50 mcg (50 mcg Intravenous Patient Refused/Not Given 08/07/24 1558)  ceFEPIme  (MAXIPIME ) 2 g in sodium chloride  0.9 % 100 mL IVPB (0 g Intravenous Stopped 08/07/24 1906)    And  metroNIDAZOLE  (FLAGYL ) IVPB 500 mg (500 mg Intravenous New Bag/Given 08/07/24 1830)  hydrocortisone  sodium succinate (SOLU-CORTEF ) 100 MG injection 200 mg (200 mg Intravenous Given 08/07/24 1243)  fentaNYL  (SUBLIMAZE ) injection 50 mcg (50 mcg Intravenous  Given 08/07/24 1258)  diphenhydrAMINE  (BENADRYL ) capsule 50 mg ( Oral See Alternative 08/07/24 1601)    Or  diphenhydrAMINE  (BENADRYL ) injection 50 mg (50 mg Intravenous Given 08/07/24 1601)  HYDROmorphone  (DILAUDID ) injection 1 mg (1 mg Intravenous Given 08/07/24 1649)  iohexol  (OMNIPAQUE ) 350 MG/ML injection 100 mL (100 mLs Intravenous Contrast Given 08/07/24 1712)  HYDROmorphone  (DILAUDID ) injection 0.5 mg (0.5 mg Intravenous Given 08/07/24 1827)                                     73 year old male with a  history of chronic orthostatic hypotension, coronary artery disease, carotid artery stenosis, peripheral arterial disease, hypothyroidism, Parkinson's, GERD, admission at the end of October with uncomplicated diverticulitis after failure of outpatient antibiotic therapy, who presents with concern for chest pain, dyspnea, continued abdominal discomfort.   Differential diagnosis for dyspnea includes ACS, PE, COPD exacerbation, CHF exacerbation, anemia, pneumonia, viral etiology such as COVID 19 infection, metabolic abnormality.    Chest x-ray was done and evaluated by me showed no acute abnormalities.   EKG was evaluated by me which showed sinus tachycardia.  Labs completed and evaluated by me show mildly positive troponin, repeated and increasing, repeated again and stable.  Trend not consistent with ACS, has had mildly elevated troponin levels in the past. Mild leukocytosis. Hgb stable. No sig electrolyte abnormalities.  DDimer positive, given tachycardia, sharp pain, dyspnea, recent hospitalization, will order CTA PE study.  Given continued abdominal pain since admission, will reevaluate with CT abdomen pelvis.  Also notes right sided arm pain---normal pulses, by hx is likely cervical radiculopathy-is being seen as outpatient.  Given pain medications for this and chest pain.    Has allergy to contrast--given pretreatment, pan to evaluate with CT. Signed out with results pending.         Final diagnoses:  Chest pain, unspecified type  Generalized abdominal pain  Dyspnea, unspecified type  Acute cough  Acute diverticulitis    ED Discharge Orders     None          Dreama Longs, MD 08/07/24 1921

## 2024-08-07 NOTE — Assessment & Plan Note (Signed)
-   Check TSH continue home medications Synthroid  at 75mcg po q day

## 2024-08-07 NOTE — Assessment & Plan Note (Signed)
 Order SSI

## 2024-08-07 NOTE — H&P (Signed)
 Jared [name] FMW:[id number] DOB: [birth year] DOA: 08/07/2024     PCP: Shepard Ade, MD   Outpatient Specialists:   CARDS:  Dr. Dorn Lesches, MD    Patient arrived to ER on 08/07/24 at 1020 Referred by Attending Randol Simmonds, MD   Patient coming from:    home Lives  With family      Chief Complaint:   Chief Complaint  Patient presents with   Chest Pain    HPI: Jared [name] is a 73 y.o. male with medical history significant of CAD, PAD, GERD depression, esophageal stricture, HLD, HTN, hypothyroidism, parkinsonism, renal cell carcinoma, sleep apnea, hx of open nephrectomy     Presented with chest pain back pain abdominal pain Presents for multiple complaints including chest pain back pain has been having a bit some cough back pain has been somewhat chronic.  Patient has been admitted in October for diverticulitis which was difficult to manage and failed outpatient treatment Patient reports that he woke up with persistent sharp chest pain which was midsternal not associated with any nausea vomiting or shortness of breath does has known history of CAD patient is on Plavix  tried to use Pepto-Bismol but did not seem to help last cardiac cath was in March Been having episodic shortness of breath but no fevers Has known history of asthma  Patient also endorsing continued abdominal pain despite being hospitalized 2 weeks ago for diverticulitis As well as left thoracic back pain denies any wheezing  In March he had Right LE pain and had balloon angioplasty no stenting has been on Plavix  and compliant   Had recent bout of Shingles  Reports Chest pain ws similar to when he had his MI in 1997     Denies significant ETOH intake   Does not smoke    Regarding pertinent Chronic problems:    Hyperlipidemia -  on Repatha  Lipid Panel     Component Value Date/Time   CHOL 138 10/20/2023 0322   CHOL 185 07/11/2023 0903   TRIG 180 (H) 10/20/2023 0322   HDL 25 (L)  10/20/2023 0322   HDL 33 (L) 07/11/2023 0903   CHOLHDL 5.5 10/20/2023 0322   VLDL 36 10/20/2023 0322   LDLCALC 77 10/20/2023 0322   LDLCALC 116 (H) 07/11/2023 0903   LDLDIRECT 127 (H) 03/20/2023 1318   LABVLDL 36 07/11/2023 0903      chronic CHF diastolic - last echo  Recent Results (from the past 56199 hours)  ECHOCARDIOGRAM COMPLETE   Collection Time: 04/13/23  4:30 PM  Result Value   Area-P 1/2 2.90   S' Lateral 2.90   P 1/2 time 485   Est EF 60 - 65%   Narrative      ECHOCARDIOGRAM REPORT       Patient Name:   Jared [name] Date of Exam: 04/13/2023 Medical Rec #:  [id number]         Height:       67.0 in Accession #:    [id number]        Weight:       198.0 lb Date of Birth:  04/02/1951         BSA:          2.014 m Patient Age:    72 years          BP:           142/82 mmHg Patient Gender: M  HR:           72 bpm. Exam Location:  [address]  Procedure: 2D Echo, 3D Echo, Cardiac Doppler and Color Doppler  Indications:    I47.2 Ventricular Tachycardia   History:        Patient has prior history of Echocardiogram examinations, most                 recent 04/06/2022. CAD and Previous Myocardial Infarction,                 Arrythmias:Tachycardia, Signs/Symptoms:Dizziness/Lightheadedness                 and Syncope; Risk Factors:Sleep Apnea, Family History of                 Coronary Artery Disease, Hypertension and Dyslipidemia.   Sonographer:    Heather Hawks RDCS Referring Phys: CAITLIN S WALKER  IMPRESSIONS    1. Left ventricular ejection fraction, by estimation, is 60 to 65%. The left ventricle has normal function. The left ventricle has no regional wall motion abnormalities. Left ventricular diastolic parameters are consistent with Grade I diastolic  dysfunction (impaired relaxation).  2. Right ventricular systolic function is normal. The right ventricular size is normal. There is normal pulmonary artery systolic pressure.  3. The mitral  valve is normal in structure. Trivial mitral valve regurgitation. No evidence of mitral stenosis.  4. The aortic valve is tricuspid. Aortic valve regurgitation is mild. No aortic stenosis is present.  5. The inferior vena cava is normal in size with greater than 50% respiratory variability, suggesting right atrial pressure of 3 mmHg.           CAD  - On Aspirin , repatha , Plavix                  -  followed by cardiology            MI in 1997    DM 2 -  Lab Results  Component Value Date   HGBA1C 6.5 (H) 03/01/2022   diet controlled   Hypothyroidism:   Lab Results  Component Value Date   TSH 3.48 01/01/2024   T3TOTAL 108 10/28/2020   T4TOTAL 8.3 10/28/2020   on synthroid     obesity-   BMI Readings from Last 1 Encounters:  08/07/24 30.87 kg/m       Asthma -well   controlled on home inhalers/ nebs                   Last steroid used was 1 year ago      OSA -on nocturnal  CPAP has not used for the past few weeks         CKD stage IIIa baseline Cr 1.3 Estimated Creatinine Clearance: 60.2 mL/min (by C-G formula based on SCr of 1.13 mg/dL).  Lab Results  Component Value Date   CREATININE 1.13 08/07/2024   CREATININE 1.18 07/18/2024   CREATININE 1.30 (H) 07/16/2024   Lab Results  Component Value Date   NA 141 08/07/2024   CL 105 08/07/2024   K 4.2 08/07/2024   CO2 22 08/07/2024   BUN 20 08/07/2024   CREATININE 1.13 08/07/2024   GFRNONAA >60 08/07/2024   CALCIUM  9.5 08/07/2024   PHOS 4.5 11/17/2019   ALBUMIN 4.1 07/18/2024   GLUCOSE 132 (H) 08/07/2024     BPH - on Flomax , Proscar     Parkinsonism - Carbidopa    While in ER:    CT  showed diverticulitis and abscess     Lab Orders         Resp panel by RT-PCR (RSV, Flu A&B, Covid) Anterior Nasal Swab         Basic metabolic panel         CBC         D-dimer, quantitative        CXR -  NON acute  CTabd/pelvis - Findings compatible with acute diverticulitis of the distal descending colon. There is  questionable large diverticulum versus wall abscess measuring 3.8 x 2.2 x 3.5 cm. No free air.  CTA chest -  no PE,New scattered pulmonary nodules   Following Medications were ordered in ER: Medications  fentaNYL  (SUBLIMAZE ) injection 50 mcg (50 mcg Intravenous Patient Refused/Not Given 08/07/24 1558)  ceFEPIme  (MAXIPIME ) 2 g in sodium chloride  0.9 % 100 mL IVPB (0 g Intravenous Stopped 08/07/24 1906)    And  metroNIDAZOLE  (FLAGYL ) IVPB 500 mg (500 mg Intravenous New Bag/Given 08/07/24 1830)  hydrocortisone  sodium succinate  (SOLU-CORTEF ) 100 MG injection 200 mg (200 mg Intravenous Given 08/07/24 1243)  fentaNYL  (SUBLIMAZE ) injection 50 mcg (50 mcg Intravenous Given 08/07/24 1258)  diphenhydrAMINE  (BENADRYL ) capsule 50 mg ( Oral See Alternative 08/07/24 1601)    Or  diphenhydrAMINE  (BENADRYL ) injection 50 mg (50 mg Intravenous Given 08/07/24 1601)  HYDROmorphone  (DILAUDID ) injection 1 mg (1 mg Intravenous Given 08/07/24 1649)  iohexol  (OMNIPAQUE ) 350 MG/ML injection 100 mL (100 mLs Intravenous Contrast Given 08/07/24 1712)  HYDROmorphone  (DILAUDID ) injection 0.5 mg (0.5 mg Intravenous Given 08/07/24 1827)    _______________________________________________________ ER Provider Called:    General surgery   Dr.Ramirez They Recommend admit to medicine   1.  Recommend medical admission 2.  IV antibiotics and n.p.o., hold plavix  3.  Discussed patient that this does not resolve he may require laparotomy, colectomy and colostomy. 4.  Will follow along.         ED Triage Vitals  Encounter Vitals Group     BP 08/07/24 1038 (!) 119/94     Girls Systolic BP Percentile --      Girls Diastolic BP Percentile --      Boys Systolic BP Percentile --      Boys Diastolic BP Percentile --      Pulse Rate 08/07/24 1038 (!) 105     Resp 08/07/24 1038 18     Temp 08/07/24 1038 98.3 F (36.8 C)     Temp Source 08/07/24 1038 Oral     SpO2 08/07/24 1038 96 %     Weight 08/07/24 1255 197 lb 1.5 oz  (89.4 kg)     Height 08/07/24 1255 5' 7 (1.702 m)     Head Circumference --      Peak Flow --      Pain Score 08/07/24 1037 7     Pain Loc --      Pain Education --      Exclude from Growth Chart --   UFJK(75)@     _________________________________________ Significant initial  Findings: Abnormal Labs Reviewed  BASIC METABOLIC PANEL WITH GFR - Abnormal; Notable for the following components:      Result Value   Glucose, Bld 132 (*)    All other components within normal limits  CBC - Abnormal; Notable for the following components:   WBC 11.9 (*)    RDW 17.2 (*)    All other components within normal limits  D-DIMER, QUANTITATIVE - Abnormal; Notable for the following components:  D-Dimer, Quant 1.09 (*)    All other components within normal limits  TROPONIN T, HIGH SENSITIVITY - Abnormal; Notable for the following components:   Troponin T High Sensitivity 33 (*)    All other components within normal limits  TROPONIN T, HIGH SENSITIVITY - Abnormal; Notable for the following components:   Troponin T High Sensitivity 39 (*)    All other components within normal limits  TROPONIN T, HIGH SENSITIVITY - Abnormal; Notable for the following components:   Troponin T High Sensitivity 31 (*)    All other components within normal limits  TROPONIN T, HIGH SENSITIVITY - Abnormal; Notable for the following components:   Troponin T High Sensitivity 32 (*)    All other components within normal limits      _________________________ Troponin ***ordered Cardiac Panel (last 3 results) No results for input(s): CKTOTAL, CKMB, TROPONINIHS, RELINDX in the last 72 hours.   ECG: Ordered Personally reviewed and interpreted by me showing: HR : 108 Rhythm: Sinus tachycardia Consider RVH w/ secondary repol abnormality Since prior ECG, rate has increased QTC 452  BNP (last 3 results) Recent Labs    06/06/24 1634  BNP 44.8      Recent Labs    08/07/24 1031  DDIMER 1.09*     The  recent clinical data is shown below. Vitals:   08/07/24 1500 08/07/24 1700 08/07/24 1706 08/07/24 1730  BP:  (!) 151/74  116/85  Pulse:  80  84  Resp:  15  19  Temp: 98.5 F (36.9 C)  98.2 F (36.8 C)   TempSrc: Oral  Oral   SpO2:  95%  97%  Weight:      Height:        WBC     Component Value Date/Time   WBC 11.9 (H) 08/07/2024 1035   LYMPHSABS 1.6 07/15/2024 1140   LYMPHSABS 2.3 10/03/2023 1645   MONOABS 1.3 (H) 07/15/2024 1140   EOSABS 0.1 07/15/2024 1140   EOSABS 0.3 10/03/2023 1645   BASOSABS 0.0 07/15/2024 1140   BASOSABS 0.1 10/03/2023 1645     Lactic Acid, Venous    Component Value Date/Time   LATICACIDVEN 2.0 (HH) 11/16/2019 2141     Procalcitonin   Ordered      Results for orders placed or performed during the hospital encounter of 08/07/24  Resp panel by RT-PCR (RSV, Flu A&B, Covid) Anterior Nasal Swab     Status: None   Collection Time: 08/07/24  1:01 PM   Specimen: Anterior Nasal Swab  Result Value Ref Range Status   SARS Coronavirus 2 by RT PCR NEGATIVE NEGATIVE Final         Influenza A by PCR NEGATIVE NEGATIVE Final   Influenza B by PCR NEGATIVE NEGATIVE Final         Resp Syncytial Virus by PCR NEGATIVE NEGATIVE Final          ABX started Antibiotics Given (last 72 hours)     Date/Time Action Medication Dose Rate   08/07/24 1830 New Bag/Given   metroNIDAZOLE  (FLAGYL ) IVPB 500 mg 500 mg 100 mL/hr   08/07/24 1831 New Bag/Given   ceFEPIme  (MAXIPIME ) 2 g in sodium chloride  0.9 % 100 mL IVPB 2 g 200 mL/hr       __________________________________________________________ Recent Labs  Lab 08/07/24 1035  NA 141  K 4.2  CO2 22  GLUCOSE 132*  BUN 20  CREATININE 1.13  CALCIUM  9.5    Cr   stable,   Lab Results  Component Value Date   CREATININE 1.13 08/07/2024   CREATININE 1.18 07/18/2024   CREATININE 1.30 (H) 07/16/2024    No results for input(s): AST, ALT, ALKPHOS, BILITOT, PROT, ALBUMIN in the last 168 hours. Lab  Results  Component Value Date   CALCIUM  9.5 08/07/2024   PHOS 4.5 11/17/2019          Plt: Lab Results  Component Value Date   PLT 316 08/07/2024         Recent Labs  Lab 08/07/24 1035  WBC 11.9*  HGB 13.8  HCT 43.7  MCV 83.1  PLT 316    HG/HCT * stable,  Down *Up from baseline see below    Component Value Date/Time   HGB 13.8 08/07/2024 1035   HGB 13.8 10/03/2023 1645   HCT 43.7 08/07/2024 1035   HCT 44.7 10/03/2023 1645   MCV 83.1 08/07/2024 1035   MCV 83 10/03/2023 1645      No results for input(s): LIPASE, AMYLASE in the last 168 hours. No results for input(s): AMMONIA in the last 168 hours.    _______________________________________________ Hospitalist was called for admission for *** Chest pain, unspecified type ***  Generalized abdominal pain ***  Dyspnea, unspecified type ***  Acute cough ***  Acute diverticulitis ***    The following Work up has been ordered so far:  Orders Placed This Encounter  Procedures   Resp panel by RT-PCR (RSV, Flu A&B, Covid) Anterior Nasal Swab   DG Chest 2 View   CT Angio Chest PE W and/or Wo Contrast   CT ABDOMEN PELVIS W CONTRAST   Basic metabolic panel   CBC   D-dimer, quantitative   Document Height and Actual Weight   Consult to general surgery   Consult to hospitalist   EKG 12-Lead   EKG 12-Lead     OTHER Significant initial  Findings:  labs showing:     DM  labs:  HbA1C: No results for input(s): HGBA1C in the last 8760 hours.     CBG (last 3)  No results for input(s): GLUCAP in the last 72 hours.        Cultures:    Component Value Date/Time   SDES  07/16/2024 0541    BLOOD LEFT ARM Performed at [facility], [address], [place], [place] [address]    SDES  07/16/2024 [phone]    BLOOD LEFT ARM Performed at [facility], [address], [place], [place] [address]    SPECREQUEST  07/16/2024 0541    BOTTLES DRAWN AEROBIC ONLY Blood Culture adequate  volume Performed at [facility], [address], [place], [place] [address]    SPECREQUEST  07/16/2024 0541    BOTTLES DRAWN AEROBIC ONLY Blood Culture adequate volume Performed at [facility], [address], [place], [place] [address]    CULT  07/16/2024 0541    NO GROWTH 5 DAYS Performed at [facility], [address] [place], [place] [address]    CULT  07/16/2024 0541    NO GROWTH 5 DAYS Performed at [facility], [address], [place], [place] [address]    REPTSTATUS 07/21/2024 FINAL 07/16/2024 0541   REPTSTATUS 07/21/2024 FINAL 07/16/2024 0541     Radiological Exams on Admission: CT Angio Chest PE W and/or Wo Contrast Result Date: 08/07/2024 CLINICAL DATA:  Acute abdominal pain.  High probability for PE. EXAM: CT ANGIOGRAPHY CHEST CT ABDOMEN AND PELVIS WITH CONTRAST TECHNIQUE: Multidetector  CT imaging of the chest was performed using the standard protocol during bolus administration of intravenous contrast. Multiplanar CT image reconstructions and MIPs were obtained to evaluate the vascular anatomy. Multidetector CT imaging of the abdomen and pelvis was performed using the standard protocol during bolus administration of intravenous contrast. RADIATION DOSE REDUCTION: This exam was performed according to the departmental dose-optimization program which includes automated exposure control, adjustment of the mA and/or kV according to patient size and/or use of iterative reconstruction technique. CONTRAST:  OMNIPAQUE  IOHEXOL  350 MG/ML SOLN COMPARISON:  CT abdomen and pelvis 07/18/2024.  CT chest 06/05/2024. FINDINGS: CTA CHEST FINDINGS Cardiovascular: Satisfactory opacification of the pulmonary arteries to the segmental level. No evidence of pulmonary embolism. Normal heart size. No pericardial effusion. Mediastinum/Nodes: No enlarged mediastinal, hilar, or axillary lymph nodes. Thyroid  gland, trachea, and esophagus demonstrate no  significant findings. Lungs/Pleura: There is some new scattered pulmonary nodules in the left lower lobe and inferior left upper lobe measuring up to 5 mm. There is no focal lung infiltrate. Pleural effusion or pneumothorax. Musculoskeletal: Review of the MIP images confirms the above findings. CT ABDOMEN and PELVIS FINDINGS Hepatobiliary: There is a calcification in the left liver which is unchanged. No new liver lesions are seen. Gallbladder and bile ducts are within normal limits. Pancreas: Unremarkable. No pancreatic ductal dilatation or surrounding inflammatory changes. Spleen: Normal in size without focal abnormality. Adrenals/Urinary Tract: Left kidney is absent. Adrenal glands are within normal limits. Right renal cysts are present measuring up 2 4 cm. The bladder is within normal limits. Stomach/Bowel: There is sigmoid and descending colon diverticulosis. There is some wall thickening and mild inflammation of the distal descending colon concerning for acute diverticulitis. This is in his similar location when compared to the prior study. On coronal image there is questionable large diverticulum versus wall abscess on image 7/93 measuring 3.8 x 2.2 by 3.5 cm. There is no evidence for bowel obstruction or free air. The appendix appears normal. Small bowel and stomach are within normal limits. Vascular/Lymphatic: Aortic atherosclerosis. No enlarged abdominal or pelvic lymph nodes. Reproductive: Prostate is unremarkable. Other: There are small fat containing bilateral inguinal hernias. Musculoskeletal: Lumbar spinal stimulator device present. Left hip arthroplasty present. No acute osseous abnormality. Review of the MIP images confirms the above findings. IMPRESSION: 1. No evidence for pulmonary embolism. 2. New scattered pulmonary nodules in the left lower lobe and inferior left upper lobe measuring up to 5 mm. Findings are likely infectious/inflammatory. No follow-up needed if patient is low-risk (and has no  known or suspected primary neoplasm). Non-contrast chest CT can be considered in 12 months if patient is high-risk. This recommendation follows the consensus statement: Guidelines for Management of Incidental Pulmonary Nodules Detected on CT Images: From the Fleischner Society 2017; Radiology 2017; 284:228-243. 3. Findings compatible with acute diverticulitis of the distal descending colon. There is questionable large diverticulum versus wall abscess measuring 3.8 x 2.2 x 3.5 cm. No free air. 4. Aortic atherosclerosis. Aortic Atherosclerosis (ICD10-I70.0). Electronically Signed   By: Greig Pique M.D.   On: 08/07/2024 17:50   CT ABDOMEN PELVIS W CONTRAST Result Date: 08/07/2024 CLINICAL DATA:  Acute abdominal pain.  High probability for PE. EXAM: CT ANGIOGRAPHY CHEST CT ABDOMEN AND PELVIS WITH CONTRAST TECHNIQUE: Multidetector CT imaging of the chest was performed using the standard protocol during bolus administration of intravenous contrast. Multiplanar CT image reconstructions and MIPs were obtained to evaluate the vascular anatomy. Multidetector CT imaging of the abdomen and pelvis was performed  using the standard protocol during bolus administration of intravenous contrast. RADIATION DOSE REDUCTION: This exam was performed according to the departmental dose-optimization program which includes automated exposure control, adjustment of the mA and/or kV according to patient size and/or use of iterative reconstruction technique. CONTRAST:  OMNIPAQUE  IOHEXOL  350 MG/ML SOLN COMPARISON:  CT abdomen and pelvis 07/18/2024.  CT chest 06/05/2024. FINDINGS: CTA CHEST FINDINGS Cardiovascular: Satisfactory opacification of the pulmonary arteries to the segmental level. No evidence of pulmonary embolism. Normal heart size. No pericardial effusion. Mediastinum/Nodes: No enlarged mediastinal, hilar, or axillary lymph nodes. Thyroid  gland, trachea, and esophagus demonstrate no significant findings. Lungs/Pleura: There  is some new scattered pulmonary nodules in the left lower lobe and inferior left upper lobe measuring up to 5 mm. There is no focal lung infiltrate. Pleural effusion or pneumothorax. Musculoskeletal: Review of the MIP images confirms the above findings. CT ABDOMEN and PELVIS FINDINGS Hepatobiliary: There is a calcification in the left liver which is unchanged. No new liver lesions are seen. Gallbladder and bile ducts are within normal limits. Pancreas: Unremarkable. No pancreatic ductal dilatation or surrounding inflammatory changes. Spleen: Normal in size without focal abnormality. Adrenals/Urinary Tract: Left kidney is absent. Adrenal glands are within normal limits. Right renal cysts are present measuring up 2 4 cm. The bladder is within normal limits. Stomach/Bowel: There is sigmoid and descending colon diverticulosis. There is some wall thickening and mild inflammation of the distal descending colon concerning for acute diverticulitis. This is in his similar location when compared to the prior study. On coronal image there is questionable large diverticulum versus wall abscess on image 7/93 measuring 3.8 x 2.2 by 3.5 cm. There is no evidence for bowel obstruction or free air. The appendix appears normal. Small bowel and stomach are within normal limits. Vascular/Lymphatic: Aortic atherosclerosis. No enlarged abdominal or pelvic lymph nodes. Reproductive: Prostate is unremarkable. Other: There are small fat containing bilateral inguinal hernias. Musculoskeletal: Lumbar spinal stimulator device present. Left hip arthroplasty present. No acute osseous abnormality. Review of the MIP images confirms the above findings. IMPRESSION: 1. No evidence for pulmonary embolism. 2. New scattered pulmonary nodules in the left lower lobe and inferior left upper lobe measuring up to 5 mm. Findings are likely infectious/inflammatory. No follow-up needed if patient is low-risk (and has no known or suspected primary neoplasm).  Non-contrast chest CT can be considered in 12 months if patient is high-risk. This recommendation follows the consensus statement: Guidelines for Management of Incidental Pulmonary Nodules Detected on CT Images: From the Fleischner Society 2017; Radiology 2017; 284:228-243. 3. Findings compatible with acute diverticulitis of the distal descending colon. There is questionable large diverticulum versus wall abscess measuring 3.8 x 2.2 x 3.5 cm. No free air. 4. Aortic atherosclerosis. Aortic Atherosclerosis (ICD10-I70.0). Electronically Signed   By: Greig Pique M.D.   On: 08/07/2024 17:50   DG Chest 2 View Result Date: 08/07/2024 CLINICAL DATA:  Chest pain. EXAM: CHEST - 2 VIEW COMPARISON:  06/05/2024. FINDINGS: The heart size and mediastinal contours are unchanged. Aortic atherosclerosis. Subsegmental atelectasis/scarring again noted in the right middle lobe. No overt edema, acute focal consolidation, pleural effusion, or pneumothorax. Lower cervical ACDF. No acute osseous abnormality. IMPRESSION: No acute cardiopulmonary findings. Electronically Signed   By: Harrietta Sherry M.D.   On: 08/07/2024 11:29   _______________________________________________________________________________________________________ Latest  Blood pressure 116/85, pulse 84, temperature 98.2 F (36.8 C), temperature source Oral, resp. rate 19, height 5' 7 (1.702 m), weight 89.4 kg, SpO2 97%.   Vitals  labs and radiology finding personally reviewed  Review of Systems:    Pertinent positives include:  fatigue, back pain chest pain   back pain.  Constitutional:  No weight loss, night sweats, Fevers, chills, , weight loss  HEENT:  No headaches, Difficulty swallowing,Tooth/dental problems,Sore throat,  No sneezing, itching, ear ache, nasal congestion, post nasal drip,  Cardio-vascular:  No   Orthopnea, PND, anasarca, dizziness, palpitations.no Bilateral lower extremity swelling  GI:  No heartburn, indigestion, abdominal  pain, nausea, vomiting, diarrhea, change in bowel habits, loss of appetite, melena, blood in stool, hematemesis Resp:  no shortness of breath at rest. No dyspnea on exertion, No excess mucus, no productive cough, No non-productive cough, No coughing up of blood.No change in color of mucus.No wheezing. Skin:  no rash or lesions. No jaundice GU:  no dysuria, change in color of urine, no urgency or frequency. No straining to urinate.  No flank pain.  Musculoskeletal:  No joint pain or no joint swelling. No decreased range of motion.  Psych:  No change in mood or affect. No depression or anxiety. No memory loss.  Neuro: no localizing neurological complaints, no tingling, no weakness, no double vision, no gait abnormality, no slurred speech, no confusion  All systems reviewed and apart from HOPI all are negative _______________________________________________________________________________________________ Past Medical History:   Past Medical History:  Diagnosis Date   Anxiety    Asthma    Cervical vertebral fusion 05/02/2014   Now presenting with C5-6 radiculopathy   Coronary artery disease    COVID 01/2020   pain all over monoclonal antibodies given all symptoms resolved   GERD (gastroesophageal reflux disease)    H/O heart artery stent 2000   to right rca   Heart attack (HCC) 2009   History of depression 03/19/2021   History of esophageal stricture    History of spinal fracture 2015   Hyperlipidemia    Hypertension    Hypothyroidism    Neuroleptic induced parkinsonism    left arm tremor   Osteoarthritis    lower back   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   Renal cell carcinoma 1997   Left kidney   Sleep apnea    wears CPAP   Sleep talking    Snoring 12/25/2013   Wears glasses       Past Surgical History:  Procedure Laterality Date   ABDOMINAL AORTOGRAM W/LOWER EXTREMITY N/A 10/19/2023   Procedure: ABDOMINAL AORTOGRAM W/LOWER EXTREMITY;  Surgeon: Court Dorn PARAS, MD;  Location: [facility];  Service: Cardiovascular;  Laterality: N/A;   ANTERIOR CERVICAL DECOMP/DISCECTOMY FUSION N/A 08/27/2014   Procedure: Cervical six-seven anterior cervical decompression fusion with removal of hardware at Cervical five-six;  Surgeon: Fairy Levels, MD;  Location: [facility];  Service: Neurosurgery;  Laterality: N/A;  Cervical six-seven anterior cervical decompression fusion with removal of hardware at Cervical five-six   BACK SURGERY     1992, 2013 lower back   CARDIAC CATHETERIZATION  05/10/2007   Minimal CAD, normal LV systolic function, medical management   CARDIAC CATHETERIZATION  04/08/2008   RCA ulcerated 70-80% stenosis, stented with a 3x97mm Endeavor stent at 13atm for 50sec, reduced from 80% ulcerated stenosis to 0%.   CARDIAC CATHETERIZATION  05/07/2009   50% distal left main disease-IVUS or flow wire too dangerous in particular setting to perform intervention.   CARDIAC CATHETERIZATION  03/18/2010   Medical management   CARDIOVASCULAR STRESS TEST  02/09/2012   Normal, no significant wall abnormalities noted  CARPAL TUNNEL RELEASE Bilateral 2000   COLONOSCOPY     COLONOSCOPY W/ BIOPSIES AND POLYPECTOMY     COLONOSCOPY WITH PROPOFOL   06/01/2021   Elsp[facility] at [facility]   CORONARY STENT PLACEMENT     FEMUR FRACTURE SURGERY Left 1995   HERNIA REPAIR     as infant groin   Left Hip Surgery Left 02/28/2024   NECK SURGERY  2005   NEPHRECTOMY Left 1997   secondary to cancer   PERIPHERAL VASCULAR BALLOON ANGIOPLASTY  10/19/2023   Procedure: PERIPHERAL VASCULAR BALLOON ANGIOPLASTY;  Surgeon: Court Dorn PARAS, MD;  Location: [facility];  Service: Cardiovascular;;   TOTAL KNEE ARTHROPLASTY Left 2005   TOTAL KNEE ARTHROPLASTY Right 04/13/2018   Procedure: RIGHT TOTAL KNEE ARTHROPLASTY;  Surgeon: Kay Kemps, MD;  Location: [facility] OR;  Service: Orthopedics;  Laterality: Right;   TRANSURETHRAL RESECTION OF PROSTATE N/A 03/25/2021    Procedure: TRANSURETHRAL RESECTION OF THE PROSTATE (TURP);  Surgeon: Matilda Senior, MD;  Location: [facility];  Service: Urology;  Laterality: N/A;    Social History:  Ambulatory *** independently cane, walker  wheelchair bound, bed bound     reports that he has never smoked. He has never used smokeless tobacco. He reports that he does not currently use alcohol . He reports that he does not use drugs.   Family History:   Family History  Problem Relation Age of Onset   Asthma Mother    Heart disease Mother    Hypertension Mother    Thyroid  disease Mother    Esophageal cancer Father    Barrett's esophagus Father    Bone cancer Father        mets from esophagus   Heart disease Father    Cancer Father    Depression Father    Anxiety disorder Father    Alcoholism Father    Obesity Father    Diabetes Sister    Cancer Sister        Cervical cancer   Colon cancer Maternal Uncle    Heart disease Paternal Grandfather    Heart attack Paternal Grandfather 33   Stomach cancer Neg Hx    Colon polyps Neg Hx    Rectal cancer Neg Hx    ______________________________________________________________________________________________ Allergies: Allergies  Allergen Reactions   Azithromycin  Anaphylaxis and Other (See Comments)    Ulcers in mouth, nose, and ears - also   Iodinated Contrast Media Hives   Nsaids Other (See Comments)    Was told not to take due to pt only having 1 kidney   Penicillins Hives, Itching, Nausea And Vomiting and Other (See Comments)    Tolerates Cefepime    Iohexol  Hives, Itching and Other (See Comments)    Per radiologist Dr. Scott - pt needs 13 hr prep. Pt was seen outpatient and given oral iohexol , broke out in hives and itching. Dr. Fujinaga was consulted 07/15/24   Codeine Nausea Only and Nausea And Vomiting   Penicillamine Rash   Pholcodine Rash   Statins Other (See Comments)    Myalgias   Sulfonamide Derivatives Hives and Rash      Prior to Admission medications   Medication Sig Start Date End Date Taking? Authorizing Provider  albuterol  (PROVENTIL ) (2.5 MG/3ML) 0.083% nebulizer solution Take 3 mLs (2.5 mg total) by nebulization every 6 (six) hours as needed for wheezing or shortness of breath. 12/20/22  Yes Kozlow, Camellia PARAS, MD  albuterol  (VENTOLIN  HFA) 108 [id number] Base) MCG/ACT inhaler Inhale 2 puffs into the  lungs every 4 (four) hours as needed for wheezing or shortness of breath. Can inhale two puffs every four to six hours as needed for cough, wheeze, shortness of breath, or chest tightness. 02/14/23  Yes Padgett, Danita Macintosh, MD  aspirin  EC 81 MG tablet Take 1 tablet (81 mg total) by mouth daily. Swallow whole. 10/21/23  Yes Henry Manuelita NOVAK, NP  carbidopa-levodopa (SINEMET IR) 25-100 MG tablet Take 1 tablet by mouth with breakfast, with lunch, and with evening meal.   Yes [provider]  clopidogrel  (PLAVIX ) 75 MG tablet TAKE 1 TABLET BY MOUTH DAILY WITH BREAKFAST 08/02/24  Yes Darryle Currier L, PA-C  Coenzyme Q10 (CO Q-10 PO) Take 1 capsule by mouth daily.   Yes [provider]  DULoxetine  (CYMBALTA ) 60 MG capsule Take 60 mg by mouth every morning. 04/22/21  Yes [provider]  EPINEPHrine  0.3 mg/0.3 mL IJ SOAJ injection Inject 0.3 mg into the muscle as needed for anaphylaxis. 06/12/23  Yes [provider]  finasteride  (PROSCAR ) 5 MG tablet Take 5 mg by mouth at bedtime.   Yes [provider]  gabapentin  (NEURONTIN ) 100 MG capsule Take 100 mg by mouth 3 (three) times daily.   Yes [provider]  guaiFENesin  (ROBITUSSIN) 100 MG/5ML liquid Take 10 mLs by mouth every 6 (six) hours as needed for cough or to loosen phlegm. 07/18/24  Yes Raenelle Coria, MD  levothyroxine  (SYNTHROID ) 75 MCG tablet Take 75 mcg by mouth daily before breakfast.   Yes [provider]  montelukast  (SINGULAIR ) 10 MG tablet Take 10 mg by mouth at bedtime. 06/04/24  Yes [provider]   Multiple Vitamin (MULTIVITAMIN) tablet Take 1 tablet by mouth daily with breakfast.   Yes [provider]  NUCALA  100 MG/ML SOSY Inject 100 mg into the skin every 30 (thirty) days. 02/14/23  Yes [provider]  OVER THE COUNTER MEDICATION Place 1 drop into both eyes See admin instructions. Rohto Max Strength Redness Reliever/Redness & Dry Eye Symptom Relief Lubricant Eye Drops - Instill 1 drop into both eyes every 8 hours as needed for dryness   Yes [provider]  OZEMPIC, 1 MG/DOSE, 4 MG/3ML SOPN Inject 1 mg into the skin every Friday.   Yes [provider]  pantoprazole  (PROTONIX ) 40 MG tablet TAKE 1 TABLET BY MOUTH DAILY Patient taking differently: Take 40 mg by mouth at bedtime. 12/04/23  Yes Court Dorn PARAS, MD  polyethylene glycol (MIRALAX  / GLYCOLAX ) 17 g packet Take 17 g by mouth daily. 07/18/24  Yes Ghimire, Coria, MD  REPATHA  SURECLICK 140 MG/ML SOAJ INJECT 1 PEN INTO THE SKIN EVERY 14 DAYS Patient taking differently: Inject 140 mg into the skin every 14 (fourteen) days. 11/23/23  Yes Court Dorn PARAS, MD  sodium chloride  (OCEAN) 0.65 % SOLN nasal spray Place 1 spray into both nostrils as needed for congestion.   Yes [provider]  tamsulosin  (FLOMAX ) 0.4 MG CAPS capsule Take 0.4 mg by mouth at bedtime.   Yes [provider]  TRELEGY ELLIPTA 100-62.5-25 MCG/ACT AEPB Take 1 puff by mouth daily. 05/14/24  Yes [provider]  vitamin C  (ASCORBIC ACID ) 500 MG tablet Take 500 mg by mouth 2 (two) times daily.    Yes [provider]  zinc  gluconate 50 MG tablet Take 50 mg by mouth in the morning and at bedtime.   Yes [provider]    ___________________________________________________________________________________________________ Physical Exam:    08/07/2024    5:30 PM 08/07/2024  5:00 PM 08/07/2024    2:00 PM  Vitals with BMI  Systolic 116 151 850  Diastolic 85 74 80  Pulse 84 80 93     1.  General:  in No ***Acute distress***increased work of breathing ***complaining of severe pain****agitated * Chronically ill *well *cachectic *toxic acutely ill -appearing 2. Psychological: Alert and *** Oriented 3. Head/ENT:   Moist *** Dry Mucous Membranes                          Head Non traumatic, neck supple                          Normal *** Poor Dentition 4. SKIN: normal *** decreased Skin turgor,  Skin clean Dry and intact no rash    5. Heart: Regular rate and rhythm no*** Murmur, no Rub or gallop 6. Lungs: ***Clear to auscultation bilaterally, no wheezes or crackles   7. Abdomen: Soft, ***non-tender, Non distended *** obese ***bowel sounds present 8. Lower extremities: no clubbing, cyanosis, no ***edema 9. Neurologically Grossly intact, moving all 4 extremities equally *** strength 5 out of 5 in all 4 extremities cranial nerves II through XII intact 10. MSK: Normal range of motion    Chart has been reviewed  ______________________________________________________________________________________________  Assessment/Plan  73 y.o. male with medical history significant of CAD, PAD, GERD depression, esophageal stricture, HLD, HTN, hypothyroidism, parkinsonism, renal cell carcinoma, sleep apnea, hx of open nephrectomy    Admitted for   Chest pain,   Acute diverticulitis     Present on Admission:  Diverticulitis  Acute diverticulitis  Chronic kidney disease, stage 3a (HCC)  Hypothyroidism  BPH (benign prostatic hyperplasia)  Chest pain at rest  Chronic obstructive pulmonary disease (HCC)  Essential hypertension  GERD (gastroesophageal reflux disease)  Parkinsonism (HCC)  Pre-diabetes     Acute diverticulitis   - Given abscess appreciate general surgery consult,    - Bowel rest  NPO  - Will rehydrate  - Continue IV antibiotics as patient was unable to tolerate p.o. due to significant nausea                  started on  cefepime   metronidazole  on 08/07/24  General  surgery recommended 1.  Recommend medical admission 2.  IV antibiotics and n.p.o., hold plavix  3.  Discussed patient that this does not resolve he may require laparotomy, colectomy and colostomy. 4.  Will follow along.    Chronic kidney disease, stage 3a (HCC)  -chronic avoid nephrotoxic medications such as NSAIDs, Vanco Zosyn combo,  avoid hypotension, continue to follow renal function   Hypothyroidism - Check TSH continue home medications Synthroid  at 75mcg po q day   BPH (benign prostatic hyperplasia) Continue flomax  and Proscar   Chest pain at rest Troponin is stable, echo in am serial ECG  Email to cardiology   Chronic obstructive pulmonary disease (HCC) Chronic stable continue home meds  Essential hypertension Allow permissive hypertension  GERD (gastroesophageal reflux disease) Protonix  40 mg bid  Parkinsonism (HCC) Continue carbadopa  Type 2 diabetes mellitus without complications (HCC)  - Order Sensitive   SSI    -  check TSH and HgA1C  - Hold by mouth medications    History of CAD (coronary artery disease) Hold Plavix  for tonight as per general surgery  OSA on CPAP Hold CPAP  Pre-diabetes Order SSI    Other plan as per orders.  DVT prophylaxis:  SCD      Code Status:    Code Status: Prior FULL CODE  as per patient   I had personally discussed CODE STATUS with patient  ACP   none   Family Communication:   Family not at  Bedside     Diet  npo post midnight   Disposition Plan:      To home once workup is complete and patient is stable   Following barriers for discharge:                             Chest pain  work up is complete                                                        Pain controlled with PO medications                              able to transition to PO antibiotics                             Will need to be able to tolerate PO                                                       Will need consultants to evaluate  patient prior to discharge                               Consult Orders  (From admission, onward)           Start     Ordered   08/07/24 1812  Consult to hospitalist  Once       Provider:  (Not yet assigned)  Question Answer Comment  Place call to: [facility]   Reason for Consult Admit      08/07/24 1811                                Consults called: general surgery is aware, emailed cardiology    Admission status:  ED Disposition     ED Disposition  Admit   Condition  --   Comment  Hospital Area: [facility] [[id number]]  Level of Care: Telemetry [5]  Admit to tele based on following criteria: Monitor QTC interval  May admit patient to [facility] or [facility] if equivalent level of care is available:: No  Diagnosis: Diverticulitis [805893]  Admitting Physician: Tait Balistreri [[id number]]  Attending Physician: Taevon Aschoff [[id number]]  Certification:: I certify this patient will need inpatient services for at least 2 midnights  Expected Medical Readiness: 08/10/2024            inpatient     I Expect 2 midnight stay secondary to severity of patient's current illness need for inpatient interventions justified by the following:  Severe lab/radiological/exam abnormalities including:      Acute diverticulitis    and  extensive comorbidities including:  Chronic pain  DM2   CHF * CAD  * COPD/asthma *Morbid Obesity * CKD *dementia *liver disease *history of stroke with residual deficits *  malignancy, * sickle cell disease  History of amputation Chronic anticoagulation  That are currently affecting medical management.   I expect  patient to be hospitalized for 2 midnights requiring inpatient medical care.  Patient is at high risk for adverse outcome (such as loss of life or disability) if not treated.  Indication for inpatient stay as follows:  Severe change from baseline regarding mental status Hemodynamic  instability despite maximal medical therapy,  severe pain requiring acute inpatient management,  inability to maintain oral hydration   persistent chest pain despite medical management Need for operative/procedural  intervention New or worsening hypoxia ongoing suicidal ideations   Need for IV antibiotics, IV fluids,, IV pain medications, IV anticoagulation,  IV rate controling medications, IV antihypertensives need for biPAP Frequent labs    Level of care     tele  For   24H       Partick Musselman 08/07/2024, 10:32 PM    [facility]     after 2 AM please page floor coverage   If 7AM-7PM, please contact the day team taking care of the patient using [email or web address]

## 2024-08-07 NOTE — ED Provider Notes (Addendum)
 Patient was initially seen by Dr. Dreama.  Please see her note.  Patient's imaging test were pending at the time of shift change.  CT scan of the chest does not show any signs of pulmonary embolism.  Patient does have few pulmonary nodules.  Noncon chest CT can be considered in 12 months for recheck.  Patient's abdomen pelvis CT does show evidence of acute diverticulitis with questionable large diverticulum versus a wall abscess.  I will start the patient on IV antibiotics and consult the medical service for admission.  Discussed with Dr Silvester  Case also discussed with Dr. Rubin general surgery.  He will evaluate the patient in consult      Randol Simmonds, MD 08/07/24 [phone]

## 2024-08-07 NOTE — Assessment & Plan Note (Signed)
 Hold Plavix  for tonight as per general surgery

## 2024-08-07 NOTE — Assessment & Plan Note (Signed)
 Continue carbadopa

## 2024-08-07 NOTE — Assessment & Plan Note (Signed)
 Protonix 40 mg bid

## 2024-08-07 NOTE — Assessment & Plan Note (Signed)
 -  Order Sensitive  SSI     -  check TSH and HgA1C  - Hold by mouth medications*

## 2024-08-07 NOTE — Assessment & Plan Note (Signed)
 Hold CPAP

## 2024-08-08 ENCOUNTER — Other Ambulatory Visit (HOSPITAL_COMMUNITY): Payer: Self-pay

## 2024-08-08 ENCOUNTER — Encounter (HOSPITAL_COMMUNITY): Payer: Self-pay | Admitting: Internal Medicine

## 2024-08-08 ENCOUNTER — Inpatient Hospital Stay (HOSPITAL_COMMUNITY)

## 2024-08-08 ENCOUNTER — Telehealth (HOSPITAL_COMMUNITY): Payer: Self-pay

## 2024-08-08 DIAGNOSIS — R079 Chest pain, unspecified: Secondary | ICD-10-CM

## 2024-08-08 DIAGNOSIS — I951 Orthostatic hypotension: Secondary | ICD-10-CM

## 2024-08-08 DIAGNOSIS — I2510 Atherosclerotic heart disease of native coronary artery without angina pectoris: Secondary | ICD-10-CM | POA: Diagnosis not present

## 2024-08-08 DIAGNOSIS — K5792 Diverticulitis of intestine, part unspecified, without perforation or abscess without bleeding: Secondary | ICD-10-CM | POA: Diagnosis not present

## 2024-08-08 LAB — COMPREHENSIVE METABOLIC PANEL WITH GFR
ALT: 10 U/L (ref 0–44)
AST: 20 U/L (ref 15–41)
Albumin: 4 g/dL (ref 3.5–5.0)
Alkaline Phosphatase: 78 U/L (ref 38–126)
Anion gap: 11 (ref 5–15)
BUN: 19 mg/dL (ref 8–23)
CO2: 22 mmol/L (ref 22–32)
Calcium: 9.1 mg/dL (ref 8.9–10.3)
Chloride: 106 mmol/L (ref 98–111)
Creatinine, Ser: 1.01 mg/dL (ref 0.61–1.24)
GFR, Estimated: >60 mL/min (ref >60)
Glucose, Bld: 85 mg/dL (ref 70–99)
Potassium: 3.9 mmol/L (ref 3.5–5.1)
Sodium: 139 mmol/L (ref 135–145)
Total Bilirubin: 0.5 mg/dL (ref 0.0–1.2)
Total Protein: 7.6 g/dL (ref 6.5–8.1)

## 2024-08-08 LAB — ECHOCARDIOGRAM COMPLETE
AR max vel: 3.03 cm2
AV Area VTI: 3.09 cm2
AV Area mean vel: 3.12 cm2
AV Mean grad: 6 mmHg
AV Peak grad: 11 mmHg
Ao pk vel: 1.66 m/s
Area-P 1/2: 4.89 cm2
Calc EF: 61.6 %
Height: 67 in
MV VTI: 4.51 cm2
P 1/2 time: 391 ms
S' Lateral: 3 cm
Single Plane A2C EF: 59.5 %
Single Plane A4C EF: 64.8 %
Weight: 3153.46 [oz_av]

## 2024-08-08 LAB — CBC
HCT: 41.4 % (ref 39.0–52.0)
Hemoglobin: 12.9 g/dL — ABNORMAL LOW (ref 13.0–17.0)
MCH: 26.2 pg (ref 26.0–34.0)
MCHC: 31.2 g/dL (ref 30.0–36.0)
MCV: 84.1 fL (ref 80.0–100.0)
Platelets: 273 K/uL (ref 150–400)
RBC: 4.92 MIL/uL (ref 4.22–5.81)
RDW: 17.3 % — ABNORMAL HIGH (ref 11.5–15.5)
WBC: 12.1 K/uL — ABNORMAL HIGH (ref 4.0–10.5)
nRBC: 0 % (ref 0.0–0.2)

## 2024-08-08 LAB — PREALBUMIN: Prealbumin: 26 mg/dL (ref 18–38)

## 2024-08-08 LAB — GLUCOSE, CAPILLARY
Glucose-Capillary: 103 mg/dL — ABNORMAL HIGH (ref 70–99)
Glucose-Capillary: 124 mg/dL — ABNORMAL HIGH (ref 70–99)
Glucose-Capillary: 76 mg/dL (ref 70–99)
Glucose-Capillary: 81 mg/dL (ref 70–99)
Glucose-Capillary: 86 mg/dL (ref 70–99)

## 2024-08-08 LAB — MAGNESIUM: Magnesium: 2.3 mg/dL (ref 1.7–2.4)

## 2024-08-08 LAB — HEMOGLOBIN A1C
Hgb A1c MFr Bld: 6.1 % — ABNORMAL HIGH (ref 4.8–5.6)
Mean Plasma Glucose: 128.37 mg/dL

## 2024-08-08 LAB — PHOSPHORUS: Phosphorus: 3.3 mg/dL (ref 2.5–4.6)

## 2024-08-08 LAB — T4, FREE: Free T4: 0.84 ng/dL (ref 0.61–1.12)

## 2024-08-08 MED ORDER — GUAIFENESIN-DM 100-10 MG/5ML PO SYRP
5.0000 mL | ORAL_SOLUTION | ORAL | Status: DC | PRN
  Administered 2024-08-08: 5 mL via ORAL
  Filled 2024-08-08: qty 10

## 2024-08-08 MED ORDER — INSULIN ASPART 100 UNIT/ML IJ SOLN
0.0000 [IU] | Freq: Three times a day (TID) | INTRAMUSCULAR | Status: DC
  Administered 2024-08-09: 1 [IU] via SUBCUTANEOUS
  Filled 2024-08-08: qty 1

## 2024-08-08 MED ORDER — PREDNISONE 5 MG PO TABS
50.0000 mg | ORAL_TABLET | Freq: Four times a day (QID) | ORAL | Status: AC
  Administered 2024-08-08 – 2024-08-09 (×3): 50 mg via ORAL
  Filled 2024-08-08 (×3): qty 2

## 2024-08-08 MED ORDER — DIPHENHYDRAMINE HCL 25 MG PO CAPS: 50.0000 mg | ORAL_CAPSULE | Freq: Once | ORAL | Status: DC

## 2024-08-08 MED ORDER — DIPHENHYDRAMINE HCL 50 MG/ML IJ SOLN: 50.0000 mg | Freq: Once | INTRAMUSCULAR | Status: DC

## 2024-08-08 MED ORDER — DIPHENHYDRAMINE HCL 25 MG PO CAPS
50.0000 mg | ORAL_CAPSULE | Freq: Once | ORAL | Status: AC
  Administered 2024-08-09: 50 mg via ORAL
  Filled 2024-08-08: qty 2

## 2024-08-08 MED ORDER — PREDNISONE 5 MG PO TABS: 50.0000 mg | ORAL_TABLET | Freq: Four times a day (QID) | ORAL | Status: DC

## 2024-08-08 MED ORDER — METOPROLOL TARTRATE 50 MG PO TABS
100.0000 mg | ORAL_TABLET | Freq: Once | ORAL | Status: AC
  Administered 2024-08-09: 100 mg via ORAL
  Filled 2024-08-08: qty 2

## 2024-08-08 MED ORDER — POTASSIUM CHLORIDE IN NACL 20-0.9 MEQ/L-% IV SOLN
INTRAVENOUS | Status: AC
  Filled 2024-08-08 (×2): qty 1000

## 2024-08-08 NOTE — Progress Notes (Signed)
 Patient ID: Jared [name], male   DOB: October 22, 1950, 73 y.o.   MRN: [id number]   Acute Care Surgery Service Progress Note:    Chief Complaint/Subjective: Hospitalist at bedside, patient's wife at bedside Patient reports his belly feels better today than yesterday.  No nausea or vomiting. Still having discomfort however.  Objective: Vital signs in last 24 hours: Temp:  [97.4 F (36.3 C)-98.8 F (37.1 C)] 97.4 F (36.3 C) (11/20 1114) Pulse Rate:  [69-104] 104 (11/20 1221) Resp:  [15-19] 16 (11/20 1114) BP: (116-151)/(67-85) 141/67 (11/20 1114) SpO2:  [94 %-99 %] 99 % (11/20 1221) Weight:  [89.4 kg] 89.4 kg (11/19 1255) Last BM Date : 08/08/24  Intake/Output from previous day: 11/19 0701 - 11/20 0700 In: 1001.7 [I.V.:702.2; IV Piggyback:299.5] Out: -  Intake/Output this shift: No intake/output data recorded.  Lungs:, nonlabored  Cardiovascular: reg  Abd: Soft, mild obesity, tender to palpation in the left lower quadrant, no peritonitis  Extremities: no edema, +SCDs  Neuro: alert, nonfocal  Lab Results: CBC  Recent Labs    08/07/24 1035 08/08/24 0844  WBC 11.9* 12.1*  HGB 13.8 12.9*  HCT 43.7 41.4  PLT 316 273   BMET Recent Labs    08/07/24 1035 08/08/24 0844  NA 141 139  K 4.2 3.9  CL 105 106  CO2 22 22  GLUCOSE 132* 85  BUN 20 19  CREATININE 1.13 1.01  CALCIUM  9.5 9.1   LFT    Latest Ref Rng & Units 08/08/2024    8:44 AM 08/07/2024   10:06 PM 07/18/2024   10:08 PM  Hepatic Function  Total Protein 6.5 - 8.1 g/dL 7.6  7.3  8.1   Albumin 3.5 - 5.0 g/dL 4.0  3.9  4.1   AST 15 - 41 U/L 20  26  19    ALT 0 - 44 U/L 10  8  12    Alk Phosphatase 38 - 126 U/L 78  79  83   Total Bilirubin 0.0 - 1.2 mg/dL 0.5  0.3  0.4   Bilirubin, Direct 0.0 - 0.2 mg/dL  0.1     PT/INR Recent Labs    08/07/24 2140  LABPROT 13.4  INR 1.0   ABG No results for input(s): PHART, HCO3 in the last 72 hours.  Invalid input(s): PCO2,  PO2  Studies/Results:  Anti-infectives: Anti-infectives (From admission, onward)    Start     Dose/Rate Route Frequency Ordered Stop   08/08/24 0600  metroNIDAZOLE  (FLAGYL ) IVPB 500 mg       Placed in And Linked Group   500 mg 100 mL/hr over 60 Minutes Intravenous Every 12 hours 08/07/24 1930     08/08/24 0200  ceFEPIme  (MAXIPIME ) 2 g in sodium chloride  0.9 % 100 mL IVPB        2 g 200 mL/hr over 30 Minutes Intravenous Every 8 hours 08/07/24 1941     08/07/24 1945  ceFEPIme  (MAXIPIME ) 2 g in sodium chloride  0.9 % 100 mL IVPB  Status:  Discontinued       Placed in And Linked Group   2 g 200 mL/hr over 30 Minutes Intravenous  Once 08/07/24 1930 08/07/24 1940   08/07/24 1815  ceFEPIme  (MAXIPIME ) 2 g in sodium chloride  0.9 % 100 mL IVPB       Placed in And Linked Group   2 g 200 mL/hr over 30 Minutes Intravenous  Once 08/07/24 1811 08/07/24 1906   08/07/24 1815  metroNIDAZOLE  (FLAGYL ) IVPB 500 mg  Placed in And Linked Group   500 mg 100 mL/hr over 60 Minutes Intravenous  Once 08/07/24 1811 08/07/24 1930       Medications: Scheduled Meds:  budesonide -glycopyrrolate -formoterol   2 puff Inhalation BID   carbidopa-levodopa  1 tablet Oral TID with meals   DULoxetine   60 mg Oral q morning   finasteride   5 mg Oral QHS   gabapentin   100 mg Oral TID   insulin aspart  0-9 Units Subcutaneous Q4H   levothyroxine   75 mcg Oral Q0600   montelukast   10 mg Oral QHS   pantoprazole  (PROTONIX ) IV  40 mg Intravenous Q12H   tamsulosin   0.4 mg Oral QHS   Continuous Infusions:  0.9 % NaCl with KCl 20 mEq / L 100 mL/hr at 08/08/24 0805   ceFEPime  (MAXIPIME ) IV 2 g (08/08/24 0928)   metronidazole  500 mg (08/08/24 0501)   PRN Meds:.acetaminophen  **OR** acetaminophen , albuterol , HYDROcodone -acetaminophen , HYDROmorphone  (DILAUDID ) injection, ondansetron  **OR** ondansetron  (ZOFRAN ) IV  Assessment/Plan: Patient Active Problem List   Diagnosis Date Noted   Diverticulitis 08/07/2024    Orthostatic hypotension 07/16/2024   History of CAD (coronary artery disease) 07/16/2024   Carotid artery stenosis 07/16/2024   S/P peripheral artery angioplasty 07/16/2024   History of esophageal stricture 07/16/2024   Atelectasis 07/02/2024   Acute vesicular eczema of foot 07/02/2024   Malignant melanoma of skin (HCC) 07/02/2024   Unilateral post-traumatic osteoarthritis, left hip 07/02/2024   Type 2 diabetes mellitus without complications (HCC) 07/02/2024   Preoperative clearance 02/26/2024   Mixed conductive and sensorineural hearing loss of both ears 01/02/2024   Bilateral temporomandibular joint pain 12/08/2023   Hematoma following percutaneous transluminal coronary angioplasty 10/24/2023   Claudication in peripheral vascular disease 10/19/2023   Peripheral arterial disease 07/11/2023   Carotid artery disease 07/11/2023   Allergic conjunctivitis of both eyes 02/22/2023   Drooling 12/21/2022   Hypersalivation 12/21/2022   Muscle tone increased 12/21/2022   Shuffling gait 12/21/2022   Tremor of both hands 12/21/2022   Severe persistent asthma with exacerbation (HCC) 08/08/2022   Chronic low back pain 04/04/2022   Lumbar spondylosis 04/04/2022   Hyperglycemia 03/01/2022   Acute diverticulitis 02/28/2022   Pain in joint of left knee 11/11/2021   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   Generalized weakness 06/02/2021   Hypokalemia, inadequate intake 06/02/2021   Hypothyroidism 06/02/2021   Chronic kidney disease, stage 3a (HCC) 06/02/2021   Pain of left hip joint 05/30/2021   BPH with obstruction/lower urinary tract symptoms 03/25/2021   Pre-diabetes 12/28/2020   Vitamin D  deficiency 12/09/2020   Class 1 obesity with serious comorbidity and body mass index (BMI) of 33.0 to 33.9 in adult 12/09/2020   Viral URI 07/13/2020   AKI (acute kidney injury) 11/17/2019   COVID-19 virus infection 11/16/2019   OSA on CPAP 08/28/2019   Pain due to onychomycosis  of toenails of both feet 08/27/2019   Chronic kidney disease due to hypertension 07/31/2019   Parkinsonism (HCC) 07/16/2019   Lumbago with sciatica 03/04/2019   Severe persistent asthma without complication (HCC) 01/30/2019   Seasonal and perennial allergic rhinitis 01/30/2019   Trigger finger of right thumb 10/30/2018   Trigger index finger of right hand 10/30/2018   Status post total knee replacement, 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   Stress fracture of femur 03/15/2018   Allergic rhinitis due to allergen 12/21/2017   LPRD (laryngopharyngeal reflux disease) 12/21/2017   Tear of medial meniscus of knee 12/12/2017   Osteoarthritis of knee 10/03/2017  Chronic obstructive pulmonary disease (HCC) 07/10/2017   Allergic rhinoconjunctivitis 05/21/2015   Brachial neuritis 10/13/2014   Cervical radiculopathy 09/11/2014   C7 cervical fracture (HCC) 08/27/2014   Cervical spine fracture (HCC) 08/23/2014   Abnormal brain MRI 07/23/2014   Parkinsonian features 06/25/2014   Cervical vertebral fusion 05/02/2014   Cervical radiculitis 04/23/2014   Snoring 12/25/2013   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   Insomnia due to mental condition 12/25/2013   Sleep talking    Essential hypertension 07/08/2013   Depression, prolonged 06/26/2013   Night sweats 06/26/2013   Chest pain at rest 06/25/2013   Testicular hypofunction 11/12/2012   Bronchiectasis without acute exacerbation (HCC) 01/16/2008   PULMONARY NODULE 12/20/2007   HYPERNEPHROMA 11/27/2007   Mixed hyperlipidemia 11/27/2007   Coronary atherosclerosis 11/27/2007   HEMORRHOIDS, INTERNAL 11/27/2007   Moderate persistent asthma 11/27/2007   ESOPHAGITIS 11/27/2007   ESOPHAGEAL STRICTURE 11/27/2007   GERD (gastroesophageal reflux disease) 11/27/2007   GASTRITIS, ACUTE 11/27/2007   DIVERTICULOSIS, COLON 11/27/2007   ABDOMINAL PAIN, LEFT UPPER QUADRANT 11/27/2007   HIP FRACTURE, LEFT 11/27/2007   FRACTURE, TIBIA 11/27/2007   BPH (benign  prostatic hyperplasia) 11/27/2007    Diverticulitis with possible abscess Chronic kidney disease COPD Hypertension CAD on Plavix  OSA Prediabetes  Afebrile.  Less abdominal pain. Discussed management of diverticulitis with possible abscess, discussed smoldering diverticulitis  ID: Cefepime  Flagyl  FEN: Start clear liquid diet  VTE prophylaxis: SCDs, okay for chemical VTE prophylaxis  Disposition: Clear liquid diet  Data reviewed: I reviewed labs since admission, vital signs since admission, hospitalist note, discussed case with hospitalist, surgery consult note, personally reviewed his CT scan, reviewed hospital discharge summary from October  Moderate MDM  LOS: 1 day    Camellia HERO. Tanda, MD, FACS General, Bariatric, & Minimally Invasive Surgery [phone] [facility], [facility]

## 2024-08-08 NOTE — Progress Notes (Signed)
 Mobility Specialist Progress Note:   08/08/24 1102  Mobility  Activity Ambulated with assistance  Level of Assistance Modified independent, requires aide device or extra time  Assistive Device  (IV Pole)  Distance Ambulated (ft) 220 ft  Activity Response Tolerated well  Mobility Referral Yes  Mobility visit 1 Mobility  Mobility Specialist Start Time (ACUTE ONLY) 1051  Mobility Specialist Stop Time (ACUTE ONLY) 1103  Mobility Specialist Time Calculation (min) (ACUTE ONLY) 12 min   Pt was received in bed and agreed to mobility. No complaints during ambulation. Returned to bed with all needs met and call bell in reach. Left in room with family.  [employer] - Mobility Specialist

## 2024-08-08 NOTE — TOC Initial Note (Signed)
 Transition of Care [facility]) - Initial/Assessment Note    Patient Details  Name: Jared [name] MRN: [id number] Date of Birth: Nov 20, 1950  Transition of Care [facility]) CM/SW Contact:    Alfonse [name], RN Phone Number: 08/08/2024, 11:41 AM  Clinical Narrative:    Patient admitted from home, resides with spouse in a private residence, A&O x 4, independent with functional mobility . INPT CM will follow for dc needs.                       Patient Goals and CMS Choice            Expected Discharge Plan and Services                                              Prior Living Arrangements/Services                       Activities of Daily Living   ADL Screening (condition at time of admission) Independently performs ADLs?: Yes (appropriate for developmental age) Is the patient deaf or have difficulty hearing?: No Does the patient have difficulty seeing, even when wearing glasses/contacts?: No Does the patient have difficulty concentrating, remembering, or making decisions?: No  Permission Sought/Granted                  Emotional Assessment              Admission diagnosis:  Diverticulitis [K57.92] Generalized abdominal pain [R10.84] Acute diverticulitis [K57.92] Dyspnea, unspecified type [R06.00] Chest pain, unspecified type [R07.9] Acute cough [R05.1] Patient Active Problem List   Diagnosis Date Noted   Diverticulitis 08/07/2024   Orthostatic hypotension 07/16/2024   History of CAD (coronary artery disease) 07/16/2024   Carotid artery stenosis 07/16/2024   S/P peripheral artery angioplasty 07/16/2024   History of esophageal stricture 07/16/2024   Atelectasis 07/02/2024   Acute vesicular eczema of foot 07/02/2024   Malignant melanoma of skin (HCC) 07/02/2024   Unilateral post-traumatic osteoarthritis, left hip 07/02/2024   Type 2 diabetes mellitus without complications (HCC) 07/02/2024   Preoperative clearance 02/26/2024   Mixed  conductive and sensorineural hearing loss of both ears 01/02/2024   Bilateral temporomandibular joint pain 12/08/2023   Hematoma following percutaneous transluminal coronary angioplasty 10/24/2023   Claudication in peripheral vascular disease 10/19/2023   Peripheral arterial disease 07/11/2023   Carotid artery disease 07/11/2023   Allergic conjunctivitis of both eyes 02/22/2023   Drooling 12/21/2022   Hypersalivation 12/21/2022   Muscle tone increased 12/21/2022   Shuffling gait 12/21/2022   Tremor of both hands 12/21/2022   Severe persistent asthma with exacerbation (HCC) 08/08/2022   Chronic low back pain 04/04/2022   Lumbar spondylosis 04/04/2022   Hyperglycemia 03/01/2022   Acute diverticulitis 02/28/2022   Pain in joint of left knee 11/11/2021   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   Generalized weakness 06/02/2021   Hypokalemia, inadequate intake 06/02/2021   Hypothyroidism 06/02/2021   Chronic kidney disease, stage 3a (HCC) 06/02/2021   Pain of left hip joint 05/30/2021   BPH with obstruction/lower urinary tract symptoms 03/25/2021   Pre-diabetes 12/28/2020   Vitamin D  deficiency 12/09/2020   Class 1 obesity with serious comorbidity and body mass index (BMI) of 33.0 to 33.9 in adult 12/09/2020   Viral URI 07/13/2020   AKI (acute  kidney injury) 11/17/2019   COVID-19 virus infection 11/16/2019   OSA on CPAP 08/28/2019   Pain due to onychomycosis of toenails of both feet 08/27/2019   Chronic kidney disease due to hypertension 07/31/2019   Parkinsonism (HCC) 07/16/2019   Lumbago with sciatica 03/04/2019   Severe persistent asthma without complication (HCC) 01/30/2019   Seasonal and perennial allergic rhinitis 01/30/2019   Trigger finger of right thumb 10/30/2018   Trigger index finger of right hand 10/30/2018   Status post total knee replacement, 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   Stress fracture of femur 03/15/2018   Allergic rhinitis due to allergen 12/21/2017    LPRD (laryngopharyngeal reflux disease) 12/21/2017   Tear of medial meniscus of knee 12/12/2017   Osteoarthritis of knee 10/03/2017   Chronic obstructive pulmonary disease (HCC) 07/10/2017   Allergic rhinoconjunctivitis 05/21/2015   Brachial neuritis 10/13/2014   Cervical radiculopathy 09/11/2014   C7 cervical fracture (HCC) 08/27/2014   Cervical spine fracture (HCC) 08/23/2014   Abnormal brain MRI 07/23/2014   Parkinsonian features 06/25/2014   Cervical vertebral fusion 05/02/2014   Cervical radiculitis 04/23/2014   Snoring 12/25/2013   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   Insomnia due to mental condition 12/25/2013   Sleep talking    Essential hypertension 07/08/2013   Depression, prolonged 06/26/2013   Night sweats 06/26/2013   Chest pain at rest 06/25/2013   Testicular hypofunction 11/12/2012   Bronchiectasis without acute exacerbation (HCC) 01/16/2008   PULMONARY NODULE 12/20/2007   HYPERNEPHROMA 11/27/2007   Mixed hyperlipidemia 11/27/2007   Coronary atherosclerosis 11/27/2007   HEMORRHOIDS, INTERNAL 11/27/2007   Moderate persistent asthma 11/27/2007   ESOPHAGITIS 11/27/2007   ESOPHAGEAL STRICTURE 11/27/2007   GERD (gastroesophageal reflux disease) 11/27/2007   GASTRITIS, ACUTE 11/27/2007   DIVERTICULOSIS, COLON 11/27/2007   ABDOMINAL PAIN, LEFT UPPER QUADRANT 11/27/2007   HIP FRACTURE, LEFT 11/27/2007   FRACTURE, TIBIA 11/27/2007   BPH (benign prostatic hyperplasia) 11/27/2007   PCP:  Shepard Ade, MD Pharmacy:   [id number] [place], [place] - [address] AT [facility] [address] [place] [address] Phone: [phone] Fax: [phone]     Social Drivers of Health (SDOH) Social History: SDOH Screenings   Food Insecurity: No Food Insecurity (08/08/2024)  Housing: Low Risk  (08/08/2024)  Transportation Needs: No Transportation Needs (08/08/2024)  Utilities: Not At Risk (08/08/2024)   Depression (PHQ2-9): Medium Risk (10/28/2020)  Social Connections: Socially Integrated (08/08/2024)  Tobacco Use: Low Risk  (08/07/2024)   SDOH Interventions:     Readmission Risk Interventions     No data to display

## 2024-08-08 NOTE — Plan of Care (Signed)
  Problem: Education: Goal: Ability to describe self-care measures that may prevent or decrease complications (Diabetes Survival Skills Education) will improve Outcome: Progressing Goal: Individualized Educational Video(s) Outcome: Progressing   Problem: Coping: Goal: Ability to adjust to condition or change in health will improve Outcome: Progressing   Problem: Fluid Volume: Goal: Ability to maintain a balanced intake and output will improve Outcome: Progressing   Problem: Activity: Goal: Risk for activity intolerance will decrease Outcome: Progressing

## 2024-08-08 NOTE — Telephone Encounter (Signed)
 Pharmacy Patient Advocate Encounter  Insurance verification completed.    The patient is insured through [employer]. Patient has Medicare and is not eligible for a copay card, but may be able to apply for patient assistance or Medicare RX Payment Plan (Patient Must reach out to their plan, if eligible for payment plan), if available.    Ran test claim for Breztri  [id number] and the current 30 day co-pay is $0.   This test claim was processed through [facility]- copay amounts may vary at other pharmacies due to [employer], or as the patient moves through the different stages of their insurance plan.

## 2024-08-08 NOTE — Consult Note (Signed)
 Cardiology Consultation   Patient ID: FLOR HOUDESHELL MRN: [id number]; DOB: [birth year]  Admit date: 08/07/2024 Date of Consult: 08/08/2024  PCP:  Shepard Ade, MD   [facility] Providers Cardiologist:  Dorn Lesches, MD        Patient Profile: Jared [name] is a 73 y.o. male with a hx of Parkinson's, hypertension, hyperlipidemia, type 2 diabetes, OSA on CPAP, orthostatic hypotension, diverticulosis, shingles, GERD, CKD stage IIIb, hypothyroidism, COPD, carotid artery stenosis, PAD s/p right TPT trunk balloon angioplasty on 09/2023, and CAD  s/p PCI to the RCA in 2000 who is being seen 08/08/2024 for the evaluation of chest pain at the request of True Atlas MD.  History of Present Illness: Jared [name] is a 73 year old male with prior cardiac history listed below.  The patient has a remote history of PCI to the RCA in 2000.   The patient reportedly had a cardiac cath in 2011 that showed the stent in RCA was patent and there was 30% stenosis in the left main.  The patient also had a normal stress test on 06/2013.  Echocardiogram on 03/2023 showed a normal LVEF of 60 to 65%, no RWMA, normal RV systolic function, normal pulmonary artery systolic pressure, G1 DD, mild aortic regurgitation, and IVC with greater than 50% respiratory variability.  On 06/2023 the patient had Doppler studies done that showed a right ABI of 0.87 and left of 1.15.  He tried cilostazol  for 3 months but did not help her symptoms.  On 09/2023 he had successful right TPT trunk balloon angioplasty.  His ABIs improved to normal.  The patient was last seen in the office on 06/2024 for an ER follow-up for syncope.  The patient did have positive orthostatic vital signs today ZIO monitor was ordered to help rule out cardiac causes. It does not appear like the patient has worn this monitor yet.  Patient had carotid artery duplex done on 07/2024 that showed 1 to 39% stenosis in the left and right carotid  arteries.  On 06/2026 the patient was seen by his PCP for acute pain on his right shoulder and was diagnosed with shingles.  Patient presented to the emergency department on 11/19 for chest pain.  On interview patient reported that he came to the emergency department for substernal chest pain that woke him up out of his sleep at 8 AM yesterday.  Stated that he also has abdominal pain from his diverticulitis and pain from where he had his shingles on his shoulder and back.  Stated that his chest pain lasted for about 2 hours and spontaneously resolved.  Was unable to identify anything that made the pain better or worse such as medications, exertion or inspiration.  Patient was able to ambulate comfortably around the room and go back and forth from the bathroom without any chest pain or shortness of breath.  Stated that he is less active than he was previously but that was primarily because of the multiple health concerns that he has had this year.  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 Denies nicotine use, alcohol  use, substance  Labs showed high-sensitivity troponins 33 > 39 > 31 > 32, magnesium  2.2, potassium 4.2, creatinine 1.13, calcium  9.5, sodium 141, WBC count 11.9, and hemoglobin 13.8.  EKG showed sinus tachycardia  with a rate of 108 and T wave inversions in anterior leads that are present on prior EKGs.  Chest x ray showed No acute cardiopulmonary findings.   Pulmonary CTA showed no PE, acute diverticulitus, and aortic athersclerosis.  Past Medical History:  Diagnosis Date   Anxiety    Asthma    Cervical vertebral fusion 05/02/2014   Now presenting with C5-6 radiculopathy   Coronary artery disease    COVID 01/2020   pain all over monoclonal antibodies given all symptoms resolved   GERD (gastroesophageal reflux disease)     H/O heart artery stent 2000   to right rca   Heart attack (HCC) 2009   History of depression 03/19/2021   History of esophageal stricture    History of spinal fracture 2015   Hyperlipidemia    Hypertension    Hypothyroidism    Neuroleptic induced parkinsonism    left arm tremor   Osteoarthritis    lower back   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   Renal cell carcinoma 1997   Left kidney   Sleep apnea    wears CPAP   Sleep talking    Snoring 12/25/2013   Wears glasses     Past Surgical History:  Procedure Laterality Date   ABDOMINAL AORTOGRAM W/LOWER EXTREMITY N/A 10/19/2023   Procedure: ABDOMINAL AORTOGRAM W/LOWER EXTREMITY;  Surgeon: Court Dorn PARAS, MD;  Location: [facility];  Service: Cardiovascular;  Laterality: N/A;   ANTERIOR CERVICAL DECOMP/DISCECTOMY FUSION N/A 08/27/2014   Procedure: Cervical six-seven anterior cervical decompression fusion with removal of hardware at Cervical five-six;  Surgeon: Fairy Levels, MD;  Location: [facility];  Service: Neurosurgery;  Laterality: N/A;  Cervical six-seven anterior cervical decompression fusion with removal of hardware at Cervical five-six   BACK SURGERY     1992, 2013 lower back   CARDIAC CATHETERIZATION  05/10/2007   Minimal CAD, normal LV systolic function, medical management   CARDIAC CATHETERIZATION  04/08/2008   RCA ulcerated 70-80% stenosis, stented with a 3x5mm Endeavor stent at 13atm for 50sec, reduced from 80% ulcerated stenosis to 0%.   CARDIAC CATHETERIZATION  05/07/2009   50% distal left main disease-IVUS or flow wire too dangerous in particular setting to perform intervention.   CARDIAC CATHETERIZATION  03/18/2010   Medical management   CARDIOVASCULAR STRESS TEST  02/09/2012   Normal, no significant wall abnormalities noted   CARPAL TUNNEL RELEASE Bilateral 2000   COLONOSCOPY     COLONOSCOPY W/ BIOPSIES AND POLYPECTOMY     COLONOSCOPY WITH PROPOFOL   06/01/2021   Elspeth Naval at  [facility]   CORONARY STENT PLACEMENT     FEMUR FRACTURE SURGERY Left 1995   HERNIA REPAIR     as infant groin   Left Hip Surgery Left 02/28/2024   NECK SURGERY  2005   NEPHRECTOMY Left 1997   secondary to cancer   PERIPHERAL VASCULAR BALLOON ANGIOPLASTY  10/19/2023   Procedure: PERIPHERAL VASCULAR BALLOON ANGIOPLASTY;  Surgeon: Court Dorn PARAS, MD;  Location: [facility];  Service: Cardiovascular;;   TOTAL KNEE ARTHROPLASTY Left 2005   TOTAL KNEE ARTHROPLASTY Right 04/13/2018   Procedure: RIGHT TOTAL KNEE ARTHROPLASTY;  Surgeon: Kay Kemps, MD;  Location: [facility];  Service: Orthopedics;  Laterality: Right;   TRANSURETHRAL RESECTION OF PROSTATE N/A 03/25/2021   Procedure: TRANSURETHRAL RESECTION OF THE PROSTATE (TURP);  Surgeon: Matilda Senior, MD;  Location: [facility];  Service: Urology;  Laterality: N/A;     Home Medications:  Prior  to Admission medications   Medication Sig Start Date End Date Taking? Authorizing Provider  albuterol  (PROVENTIL ) (2.5 MG/3ML) 0.083% nebulizer solution Take 3 mLs (2.5 mg total) by nebulization every 6 (six) hours as needed for wheezing or shortness of breath. 12/20/22  Yes Kozlow, Camellia PARAS, MD  albuterol  (VENTOLIN  HFA) 108 (90 Base) MCG/ACT inhaler Inhale 2 puffs into the lungs every 4 (four) hours as needed for wheezing or shortness of breath. Can inhale two puffs every four to six hours as needed for cough, wheeze, shortness of breath, or chest tightness. 02/14/23  Yes Padgett, Danita Macintosh, MD  aspirin  EC 81 MG tablet Take 1 tablet (81 mg total) by mouth daily. Swallow whole. 10/21/23  Yes Henry Manuelita NOVAK, NP  carbidopa-levodopa (SINEMET IR) 25-100 MG tablet Take 1 tablet by mouth with breakfast, with lunch, and with evening meal.   Yes [provider]  clopidogrel  (PLAVIX ) 75 MG tablet TAKE 1 TABLET BY MOUTH DAILY WITH BREAKFAST 08/02/24  Yes Darryle Currier L, PA-C  Coenzyme Q10 (CO Q-10 PO) Take 1 capsule by mouth daily.    Yes [provider]  DULoxetine  (CYMBALTA ) 60 MG capsule Take 60 mg by mouth every morning. 04/22/21  Yes [provider]  EPINEPHrine  0.3 mg/0.3 mL IJ SOAJ injection Inject 0.3 mg into the muscle as needed for anaphylaxis. 06/12/23  Yes [provider]  finasteride  (PROSCAR ) 5 MG tablet Take 5 mg by mouth at bedtime.   Yes [provider]  gabapentin  (NEURONTIN ) 100 MG capsule Take 100 mg by mouth 3 (three) times daily.   Yes [provider]  guaiFENesin  (ROBITUSSIN) 100 MG/5ML liquid Take 10 mLs by mouth every 6 (six) hours as needed for cough or to loosen phlegm. 07/18/24  Yes Raenelle Coria, MD  levothyroxine  (SYNTHROID ) 75 MCG tablet Take 75 mcg by mouth daily before breakfast.   Yes [provider]  montelukast  (SINGULAIR ) 10 MG tablet Take 10 mg by mouth at bedtime. 06/04/24  Yes [provider]  Multiple Vitamin (MULTIVITAMIN) tablet Take 1 tablet by mouth daily with breakfast.   Yes [provider]  NUCALA  100 MG/ML SOSY Inject 100 mg into the skin every 30 (thirty) days. 02/14/23  Yes [provider]  OVER THE COUNTER MEDICATION Place 1 drop into both eyes See admin instructions. Rohto Max Strength Redness Reliever/Redness & Dry Eye Symptom Relief Lubricant Eye Drops - Instill 1 drop into both eyes every 8 hours as needed for dryness   Yes [provider]  OZEMPIC, 1 MG/DOSE, 4 MG/3ML SOPN Inject 1 mg into the skin every Friday.   Yes [provider]  pantoprazole  (PROTONIX ) 40 MG tablet TAKE 1 TABLET BY MOUTH DAILY Patient taking differently: Take 40 mg by mouth at bedtime. 12/04/23  Yes Court Dorn PARAS, MD  polyethylene glycol (MIRALAX  / GLYCOLAX ) 17 g packet Take 17 g by mouth daily. 07/18/24  Yes Ghimire, Coria, MD  REPATHA  SURECLICK 140 MG/ML SOAJ INJECT 1 PEN INTO THE SKIN EVERY 14 DAYS Patient taking differently: Inject 140 mg into the skin every 14 (fourteen) days. 11/23/23  Yes Court Dorn PARAS, MD  sodium chloride  (OCEAN) 0.65 % SOLN nasal spray Place 1 spray into both nostrils as needed for congestion.   Yes [provider]  tamsulosin  (FLOMAX ) 0.4 MG CAPS capsule Take 0.4 mg by mouth at bedtime.   Yes [provider]  TRELEGY ELLIPTA 100-62.5-25 MCG/ACT AEPB Take 1 puff by mouth daily. 05/14/24  Yes [provider]  vitamin C  (ASCORBIC ACID ) 500 MG tablet Take 500 mg by mouth 2 (two) times daily.    Yes [provider]  zinc  gluconate 50 MG tablet Take 50 mg by mouth in the morning and at bedtime.   Yes [provider]    Scheduled Meds:  budesonide -glycopyrrolate -formoterol   2 puff Inhalation BID   carbidopa -levodopa   1 tablet Oral TID with meals   DULoxetine   60 mg Oral q morning   finasteride   5 mg Oral QHS   gabapentin   100 mg Oral TID   insulin  aspart  0-9 Units Subcutaneous Q4H   levothyroxine   75 mcg Oral Q0600   montelukast   10 mg Oral QHS   pantoprazole  (PROTONIX ) IV  40 mg Intravenous Q12H   tamsulosin   0.4 mg Oral QHS   Continuous Infusions:  0.9 % NaCl with KCl 20 mEq / L 100 mL/hr at 08/08/24 0805   ceFEPime  (MAXIPIME ) IV 2 g (08/08/24 0215)   metronidazole  500 mg (08/08/24 0501)   PRN Meds: acetaminophen  **OR** acetaminophen , albuterol , HYDROcodone -acetaminophen , HYDROmorphone  (DILAUDID ) injection, ondansetron  **OR** ondansetron  (ZOFRAN ) IV  Allergies:    Allergies  Allergen Reactions   Azithromycin  Anaphylaxis and Other (See Comments)    Ulcers in mouth, nose, and ears - also   Iodinated Contrast Media Hives   Nsaids Other (See Comments)    Was told not to take due to pt only having 1 kidney   Penicillins Hives, Itching, Nausea And Vomiting and Other (See Comments)    Tolerates Cefepime    Iohexol  Hives, Itching and Other (See Comments)    Per radiologist Dr. Scott - pt needs 13 hr prep. Pt was seen outpatient and given oral iohexol , broke out in hives and itching. Dr. Fujinaga was consulted  07/15/24   Codeine Nausea Only and Nausea And Vomiting   Penicillamine Rash   Pholcodine Rash   Statins Other (See Comments)    Myalgias   Sulfonamide Derivatives Hives and Rash    Social History:   Social History   Socioeconomic History   Marital status: Married    Spouse name: Not on file   Number of children: 1   Years of education: 14   Highest education level: Not on file  Occupational History   Occupation: OWNER. Landscape Design/horticulture    Employer: [employer]   Occupation: OWNER    Employer: [employer]    Comment: retired  Tobacco Use   Smoking status: Never   Smokeless tobacco: Never  Vaping Use   Vaping status: Never Used  Substance and Sexual Activity   Alcohol  use: Not Currently   Drug use: No   Sexual activity: Not Currently  Other Topics Concern   Not on file  Social History Narrative   Patient is married Philemon).   Patient drinks one cup of coffee but not everyday.   Patient has one child.   Patient has a college education.   Patient is right-handed.   Social Drivers of Corporate Investment Banker Strain: Not on file  Food Insecurity: No Food Insecurity (08/08/2024)   Hunger Vital Sign    Worried About Running Out of Food in the Last Year: Never true    Ran Out of Food in the Last Year: Never true  Transportation Needs: No Transportation Needs (08/08/2024)   PRAPARE - Administrator, Civil Service (Medical): No    Lack of Transportation (Non-Medical): No  Physical Activity: Not on file  Stress: Not on file  Social Connections: Socially Integrated (08/08/2024)   Social Connection and Isolation Panel    Frequency of Communication with Friends and Family: More than three times a week    Frequency of Social Gatherings with Friends and Family: Once a week    Attends Religious Services: More than 4 times per year    Active Member of [employer] or Organizations: Not on file    Attends Banker Meetings: More than  4 times per year    Marital Status: Married  Catering Manager Violence: Unknown (08/08/2024)   Humiliation, Afraid, Rape, and Kick questionnaire    Fear of Current or Ex-Partner: No    Emotionally Abused: No    Physically Abused: Not on file    Sexually Abused: No    Family History:    Family History  Problem Relation Age of Onset   Asthma Mother    Heart disease Mother    Hypertension Mother    Thyroid  disease Mother    Esophageal cancer Father    Barrett's esophagus Father    Bone cancer Father        mets from esophagus   Heart disease Father    Cancer Father    Depression Father    Anxiety disorder Father    Alcoholism Father    Obesity Father    Diabetes Sister    Cancer Sister        Cervical cancer   Colon cancer Maternal Uncle    Heart disease Paternal Grandfather    Heart attack Paternal Grandfather 73   Stomach cancer Neg Hx    Colon polyps Neg Hx    Rectal cancer Neg Hx      ROS:  Please see the history of present illness.   All other ROS reviewed and negative.     Physical Exam/Data: Vitals:   08/07/24 2235 08/08/24 0014 08/08/24 0451 08/08/24 0751  BP: 134/78 (!) 141/72 (!) 142/73   Pulse: 79 84 75   Resp: 18 18 18    Temp: 97.8 F (36.6 C) 97.8 F (36.6 C) (!) 97.4 F (36.3 C)   TempSrc: Oral Oral Oral   SpO2: 98% 96% 96% 96%  Weight:      Height:        Intake/Output Summary (Last 24 hours) at 08/08/2024 0832 Last data filed at 08/08/2024 0600 Gross per 24 hour  Intake 1001.68 ml  Output --  Net 1001.68 ml      08/07/2024   12:55 PM 07/16/2024   12:07 AM 07/15/2024   10:23 AM  Last 3 Weights  Weight (lbs) 197 lb 1.5 oz 197 lb 198 lb 4 oz  Weight (kg) 89.4 kg 89.359 kg 89.926 kg     Body mass index is 30.87 kg/m.  General:  Well nourished, well developed, in no acute distress.  Alert and orientated on room air.  Able to ambulate comfortably in bed. HEENT: normal Neck: no JVD Vascular: No carotid bruits; Distal pulses 2+  bilaterally Cardiac:  normal S1, S2; RRR; no murmur.  Chest is nontender to palpation. Lungs:  clear to auscultation bilaterally, no wheezing, rhonchi or rales  Abd: Abdomen tender to palpation.  Is mostly tender in left lower quadrant Ext: no edema Musculoskeletal:  No deformities Skin: warm and dry  Neuro:   no focal abnormalities noted Psych:  Normal affect   EKG:  The EKG was personally reviewed and demonstrates:  sinus tachycardia with a rate of 108 and T wave inversions in anterior leads  that are present on prior EKGs. Telemetry:  Telemetry was personally reviewed and demonstrates: Normal sinus rhythm with resting heart rates in the 60s and 70s.  Relevant CV Studies: Echo pending  Laboratory Data: High Sensitivity Troponin:  No results for input(s): TROPONINIHS in the last 720 hours.   Chemistry Recent Labs  Lab 08/07/24 1035 08/07/24 2206  NA 141  --   K 4.2  --   CL 105  --   CO2 22  --   GLUCOSE 132*  --   BUN 20  --   CREATININE 1.13  --   CALCIUM  9.5  --   MG  --  2.2  GFRNONAA >60  --   ANIONGAP 15  --     Recent Labs  Lab 08/07/24 2206  PROT 7.3  ALBUMIN 3.9  AST 26  ALT 8  ALKPHOS 79  BILITOT 0.3   Lipids No results for input(s): CHOL, TRIG, HDL, LABVLDL, LDLCALC, CHOLHDL in the last 168 hours.  Hematology Recent Labs  Lab 08/07/24 1035  WBC 11.9*  RBC 5.26  HGB 13.8  HCT 43.7  MCV 83.1  MCH 26.2  MCHC 31.6  RDW 17.2*  PLT 316   Thyroid   Recent Labs  Lab 08/07/24 2255  TSH 0.322*  FREET4 0.84    BNPNo results for input(s): BNP, PROBNP in the last 168 hours.  DDimer  Recent Labs  Lab 08/07/24 1031  DDIMER 1.09*    Radiology/Studies:  CT Angio Chest PE W and/or Wo Contrast Result Date: 08/07/2024 CLINICAL DATA:  Acute abdominal pain.  High probability for PE. EXAM: CT ANGIOGRAPHY CHEST CT ABDOMEN AND PELVIS WITH CONTRAST TECHNIQUE: Multidetector CT imaging of the chest was performed using the standard protocol  during bolus administration of intravenous contrast. Multiplanar CT image reconstructions and MIPs were obtained to evaluate the vascular anatomy. Multidetector CT imaging of the abdomen and pelvis was performed using the standard protocol during bolus administration of intravenous contrast. RADIATION DOSE REDUCTION: This exam was performed according to the departmental dose-optimization program which includes automated exposure control, adjustment of the mA and/or kV according to patient size and/or use of iterative reconstruction technique. CONTRAST:  OMNIPAQUE  IOHEXOL  350 MG/ML SOLN COMPARISON:  CT abdomen and pelvis 07/18/2024.  CT chest 06/05/2024. FINDINGS: CTA CHEST FINDINGS Cardiovascular: Satisfactory opacification of the pulmonary arteries to the segmental level. No evidence of pulmonary embolism. Normal heart size. No pericardial effusion. Mediastinum/Nodes: No enlarged mediastinal, hilar, or axillary lymph nodes. Thyroid  gland, trachea, and esophagus demonstrate no significant findings. Lungs/Pleura: There is some new scattered pulmonary nodules in the left lower lobe and inferior left upper lobe measuring up to 5 mm. There is no focal lung infiltrate. Pleural effusion or pneumothorax. Musculoskeletal: Review of the MIP images confirms the above findings. CT ABDOMEN and PELVIS FINDINGS Hepatobiliary: There is a calcification in the left liver which is unchanged. No new liver lesions are seen. Gallbladder and bile ducts are within normal limits. Pancreas: Unremarkable. No pancreatic ductal dilatation or surrounding inflammatory changes. Spleen: Normal in size without focal abnormality. Adrenals/Urinary Tract: Left kidney is absent. Adrenal glands are within normal limits. Right renal cysts are present measuring up 2 4 cm. The bladder is within normal limits. Stomach/Bowel: There is sigmoid and descending colon diverticulosis. There is some wall thickening and mild inflammation of the distal  descending colon concerning for acute diverticulitis. This is in his similar location when compared to the prior study. On coronal image there is questionable large diverticulum  versus wall abscess on image 7/93 measuring 3.8 x 2.2 by 3.5 cm. There is no evidence for bowel obstruction or free air. The appendix appears normal. Small bowel and stomach are within normal limits. Vascular/Lymphatic: Aortic atherosclerosis. No enlarged abdominal or pelvic lymph nodes. Reproductive: Prostate is unremarkable. Other: There are small fat containing bilateral inguinal hernias. Musculoskeletal: Lumbar spinal stimulator device present. Left hip arthroplasty present. No acute osseous abnormality. Review of the MIP images confirms the above findings. IMPRESSION: 1. No evidence for pulmonary embolism. 2. New scattered pulmonary nodules in the left lower lobe and inferior left upper lobe measuring up to 5 mm. Findings are likely infectious/inflammatory. No follow-up needed if patient is low-risk (and has no known or suspected primary neoplasm). Non-contrast chest CT can be considered in 12 months if patient is high-risk. This recommendation follows the consensus statement: Guidelines for Management of Incidental Pulmonary Nodules Detected on CT Images: From the Fleischner Society 2017; Radiology 2017; 284:228-243. 3. Findings compatible with acute diverticulitis of the distal descending colon. There is questionable large diverticulum versus wall abscess measuring 3.8 x 2.2 x 3.5 cm. No free air. 4. Aortic atherosclerosis. Aortic Atherosclerosis (ICD10-I70.0). Electronically Signed   By: Greig Pique M.D.   On: 08/07/2024 17:50   CT ABDOMEN PELVIS W CONTRAST Result Date: 08/07/2024 CLINICAL DATA:  Acute abdominal pain.  High probability for PE. EXAM: CT ANGIOGRAPHY CHEST CT ABDOMEN AND PELVIS WITH CONTRAST TECHNIQUE: Multidetector CT imaging of the chest was performed using the standard protocol during bolus administration of  intravenous contrast. Multiplanar CT image reconstructions and MIPs were obtained to evaluate the vascular anatomy. Multidetector CT imaging of the abdomen and pelvis was performed using the standard protocol during bolus administration of intravenous contrast. RADIATION DOSE REDUCTION: This exam was performed according to the departmental dose-optimization program which includes automated exposure control, adjustment of the mA and/or kV according to patient size and/or use of iterative reconstruction technique. CONTRAST:  OMNIPAQUE  IOHEXOL  350 MG/ML SOLN COMPARISON:  CT abdomen and pelvis 07/18/2024.  CT chest 06/05/2024. FINDINGS: CTA CHEST FINDINGS Cardiovascular: Satisfactory opacification of the pulmonary arteries to the segmental level. No evidence of pulmonary embolism. Normal heart size. No pericardial effusion. Mediastinum/Nodes: No enlarged mediastinal, hilar, or axillary lymph nodes. Thyroid  gland, trachea, and esophagus demonstrate no significant findings. Lungs/Pleura: There is some new scattered pulmonary nodules in the left lower lobe and inferior left upper lobe measuring up to 5 mm. There is no focal lung infiltrate. Pleural effusion or pneumothorax. Musculoskeletal: Review of the MIP images confirms the above findings. CT ABDOMEN and PELVIS FINDINGS Hepatobiliary: There is a calcification in the left liver which is unchanged. No new liver lesions are seen. Gallbladder and bile ducts are within normal limits. Pancreas: Unremarkable. No pancreatic ductal dilatation or surrounding inflammatory changes. Spleen: Normal in size without focal abnormality. Adrenals/Urinary Tract: Left kidney is absent. Adrenal glands are within normal limits. Right renal cysts are present measuring up 2 4 cm. The bladder is within normal limits. Stomach/Bowel: There is sigmoid and descending colon diverticulosis. There is some wall thickening and mild inflammation of the distal descending colon concerning for acute  diverticulitis. This is in his similar location when compared to the prior study. On coronal image there is questionable large diverticulum versus wall abscess on image 7/93 measuring 3.8 x 2.2 by 3.5 cm. There is no evidence for bowel obstruction or free air. The appendix appears normal. Small bowel and stomach are within normal limits. Vascular/Lymphatic: Aortic atherosclerosis. No enlarged  abdominal or pelvic lymph nodes. Reproductive: Prostate is unremarkable. Other: There are small fat containing bilateral inguinal hernias. Musculoskeletal: Lumbar spinal stimulator device present. Left hip arthroplasty present. No acute osseous abnormality. Review of the MIP images confirms the above findings. IMPRESSION: 1. No evidence for pulmonary embolism. 2. New scattered pulmonary nodules in the left lower lobe and inferior left upper lobe measuring up to 5 mm. Findings are likely infectious/inflammatory. No follow-up needed if patient is low-risk (and has no known or suspected primary neoplasm). Non-contrast chest CT can be considered in 12 months if patient is high-risk. This recommendation follows the consensus statement: Guidelines for Management of Incidental Pulmonary Nodules Detected on CT Images: From the Fleischner Society 2017; Radiology 2017; 284:228-243. 3. Findings compatible with acute diverticulitis of the distal descending colon. There is questionable large diverticulum versus wall abscess measuring 3.8 x 2.2 x 3.5 cm. No free air. 4. Aortic atherosclerosis. Aortic Atherosclerosis (ICD10-I70.0). Electronically Signed   By: Greig Pique M.D.   On: 08/07/2024 17:50   DG Chest 2 View Result Date: 08/07/2024 CLINICAL DATA:  Chest pain. EXAM: CHEST - 2 VIEW COMPARISON:  06/05/2024. FINDINGS: The heart size and mediastinal contours are unchanged. Aortic atherosclerosis. Subsegmental atelectasis/scarring again noted in the right middle lobe. No overt edema, acute focal consolidation, pleural effusion, or  pneumothorax. Lower cervical ACDF. No acute osseous abnormality. IMPRESSION: No acute cardiopulmonary findings. Electronically Signed   By: Harrietta Sherry M.D.   On: 08/07/2024 11:29     Assessment and Plan: Jared [name] is a 73 y.o. male with a hx of Parkinson's, hypertension, hyperlipidemia, type 2 diabetes, OSA on CPAP, orthostatic hypotension, diverticulosis, shingles, GERD, CKD stage IIIb, hypothyroidism, COPD, carotid artery stenosis, PAD s/p right TPT trunk balloon angioplasty on 09/2023, and CAD  s/p PCI to the RCA in 2000 who is being seen 08/08/2024 for the evaluation of chest pain at the request of True Atlas MD.  Chest pain CAD s/p PCI to the RCA in 2000. Hyperlipidemia Patient presented to the emergency department for substernal chest pain that woke him up out of his sleep at 8 AM yesterday.  He was unable to identify anything that made the pain better or worse.  The pain resolved spontaneously about 2 hours after it started.  Patient denied any similar pains prior to or since this episode.  Denies any ongoing chest pain. Patient's chest pain is atypical for ACS. Patient is able to do more than 4 metabolic equivalents of exertion.  Has not been able to be as active as he would like with multiple health conditions he has had this year.  Denied any recent worsening dyspnea on exertion. High-sensitivity troponins 33 > 39 > 31 > 32 EKG showed sinus tachycardia with a rate of 108 and T wave inversions in anterior leads that are present on prior EKGs. Doubt that patient has ACS given elevated and essentially flat high-sensitivity troponins. Plan to do inpatient nuclear stress test to clear patient for possible laparotomy. Echo pending Continue home Repatha  after discharge. Order lipid panel and LPA.   Diverticulosis ED showed a questional large diverticulum versus wall abscess.  Patient was placed on IV antibiotics and surgery is following.  It appears like surgery is  considering doing a laparotomy, colectomy and colostomy.  Patient's Plavix  was held.   Orthostatic hypotension Hypertension Patient previously had concerns of syncope after standing up.  Orthostatic vital signs were positive.  Because of this many of the patient's blood pressure medications  were  stopped and the patient's hypertension was managed more conservatively.  Blood pressures have been slightly elevated at times but appear to fluctuate somewhat.  BP at about 5 AM this morning was 142/73.   OSA on CPAP Recommend compliance   Stage II CKD  Patient has a creatinine of 1.01 this morning.  GFR is calculated to be greater than 60.   Otherwise management per primary   Risk Assessment/Risk Scores:            For questions or updates, please contact [facility] Please consult [email or web address] for contact info under     Signed, Morse Clause, PA-C  08/08/2024 8:32 AM

## 2024-08-08 NOTE — Progress Notes (Addendum)
 [facility] Progress Note  Patient: Jared [name]     FMW:[id number]  DOA: 08/07/2024   PCP: Shepard Ade, MD       Brief hospital course: This is a 73 year old male with coronary artery disease, PAD, parkinsons disease, history of renal cell carcinoma, open nephrectomy, esophageal stricture, sleep apnea, GERD and depression who presented to the hospital with multiple complaints including chest pain, cough and abdominal pain.  He was diagnosed with diverticulitis about 2 weeks prior and was treated in the hospital from 10/27 through 10/30 after failing outpatient antibiotics.  He was discharged home with Keflex and Flagyl  for 7 more days. In the ED: He was found to have a WBC count of 11.9, troponin of 33  CT of the abdomen pelvis revealed persistent diverticulitis and possible wall abscess versus diverticulum.  Also noted were increasing pulmonary nodules. The patient was admitted for treatment of diverticulitis and a surgery consult was requested.  Subjective:  Continues to have pain in his left lower abdomen.  Also has pain in his right shoulder.  He has burning in his right forearm and hand.  He states he was evaluated by a neurosurgeon recently and was told that he needed an MRI of the neck to determine why he was having right sided shoulder and arm pain.  Assessment and Plan: Principal Problem:   Diverticulitis - Currently receiving cefepime  and Flagyl  -Clear liquid diet - Resume maintenance IV fluids for today - Per discharge summary, the patient was discharged on cephalexin and metronidazole  and he states that he completed the medication - Appreciate general surgery eval - Continue Norco and IV Dilaudid  as needed for pain  Active Problems:  Elevated troponin-complaint of chest pain -Pain resolved - Remote history of RCA stenting-evaluation of the stent in 2011 revealed a patent stent - Chronically on aspirin  and Plavix  for peripheral artery disease - Troponin  trend is flat with troponins remaining in the 30s - 2D echo was ordered and is pending  Carotid artery disease/peripheral artery disease - Has bilateral carotid artery disease with a right ICA showing 40 to 59% stenosis - History of right TPT trunk balloon angioplasty  Parkinson's disease - Continue Sinemet   Right sided shoulder and arm pain - Describes burning in his right forearm and pain in his right posterior upper back/shoulder - Outpatient workup with MRI ordered by neurosurgery  Low TSH - TSH level is 0.322-recommend outpatient follow-up    Code Status: Full Code Total time on patient care: 45 minutes DVT prophylaxis:  SCDs Start: 08/07/24 2004     Objective:   Vitals:   08/07/24 2107 08/07/24 2235 08/08/24 0014 08/08/24 0451  BP:  134/78 (!) 141/72 (!) 142/73  Pulse:  79 84 75  Resp:  18 18 18   Temp: 98.8 F (37.1 C) 97.8 F (36.6 C) 97.8 F (36.6 C) (!) 97.4 F (36.3 C)  TempSrc: Oral Oral Oral Oral  SpO2:  98% 96% 96%  Weight:      Height:       Filed Weights   08/07/24 1255  Weight: 89.4 kg   Exam: General exam: Appears comfortable  HEENT: oral mucosa moist Respiratory system: Clear to auscultation.  Cardiovascular system: S1 & S2 heard  Gastrointestinal system: Abdomen soft, tender in left lower quadrant, nondistended. Normal bowel sounds   Extremities: No cyanosis, clubbing or edema Psychiatry:  Mood & affect appropriate.      CBC: Recent Labs  Lab 08/07/24 1035  WBC 11.9*  HGB  13.8  HCT 43.7  MCV 83.1  PLT 316   Basic Metabolic Panel: Recent Labs  Lab 08/07/24 1035 08/07/24 2206  NA 141  --   K 4.2  --   CL 105  --   CO2 22  --   GLUCOSE 132*  --   BUN 20  --   CREATININE 1.13  --   CALCIUM  9.5  --   MG  --  2.2  PHOS  --  3.0     Scheduled Meds:  budesonide -glycopyrrolate -formoterol   2 puff Inhalation BID   carbidopa-levodopa  1 tablet Oral TID with meals   DULoxetine   60 mg Oral q morning   finasteride   5 mg Oral  QHS   gabapentin   100 mg Oral TID   insulin aspart  0-9 Units Subcutaneous Q4H   levothyroxine   75 mcg Oral Q0600   montelukast   10 mg Oral QHS   pantoprazole  (PROTONIX ) IV  40 mg Intravenous Q12H   tamsulosin   0.4 mg Oral QHS    Imaging and lab data personally reviewed   Author: Talaysia Pinheiro  08/08/2024 7:31 AM  To contact [facility]>   Check the care team in [facility] and look for the attending/consulting [facility] provider listed  Log into [email or web address] and use [place]'s universal password   Go to> [facility]  and find provider  If you still have difficulty reaching the provider, please page the [facility] (Director on Call) for the Hospitalists listed on amion

## 2024-08-08 NOTE — Progress Notes (Signed)
  Echocardiogram 2D Echocardiogram has been performed.  Jared [name] 08/08/2024, 9:15 AM

## 2024-08-08 NOTE — Progress Notes (Signed)
 Mobility Specialist Progress Note:   08/08/24 1628  Mobility  Activity Ambulated with assistance  Level of Assistance Standby assist, set-up cues, supervision of patient - no hands on  Assistive Device  (IV Pole)  Distance Ambulated (ft) 220 ft  Activity Response Tolerated well  Mobility Referral Yes  Mobility visit 1 Mobility  Mobility Specialist Start Time (ACUTE ONLY) 1550  Mobility Specialist Stop Time (ACUTE ONLY) 1609  Mobility Specialist Time Calculation (min) (ACUTE ONLY) 19 min   Pt was received in bed and agreed to mobility. No complaints during ambulation. Returned to bed with all needs met. Call bell in reach.  [employer] - Mobility Specialist

## 2024-08-09 ENCOUNTER — Inpatient Hospital Stay (HOSPITAL_COMMUNITY)

## 2024-08-09 DIAGNOSIS — K5792 Diverticulitis of intestine, part unspecified, without perforation or abscess without bleeding: Secondary | ICD-10-CM | POA: Diagnosis not present

## 2024-08-09 DIAGNOSIS — I951 Orthostatic hypotension: Secondary | ICD-10-CM | POA: Diagnosis not present

## 2024-08-09 DIAGNOSIS — I2510 Atherosclerotic heart disease of native coronary artery without angina pectoris: Secondary | ICD-10-CM | POA: Diagnosis not present

## 2024-08-09 DIAGNOSIS — R079 Chest pain, unspecified: Secondary | ICD-10-CM | POA: Diagnosis not present

## 2024-08-09 LAB — CBC
HCT: 40.1 % (ref 39.0–52.0)
Hemoglobin: 12.6 g/dL — ABNORMAL LOW (ref 13.0–17.0)
MCH: 26.4 pg (ref 26.0–34.0)
MCHC: 31.4 g/dL (ref 30.0–36.0)
MCV: 83.9 fL (ref 80.0–100.0)
Platelets: 270 K/uL (ref 150–400)
RBC: 4.78 MIL/uL (ref 4.22–5.81)
RDW: 17.2 % — ABNORMAL HIGH (ref 11.5–15.5)
WBC: 8.3 K/uL (ref 4.0–10.5)
nRBC: 0 % (ref 0.0–0.2)

## 2024-08-09 LAB — LIPID PANEL
Cholesterol: 122 mg/dL (ref 0–200)
HDL: 50 mg/dL (ref >40)
LDL Cholesterol: 58 mg/dL (ref 0–99)
Total CHOL/HDL Ratio: 2.5 ratio
Triglycerides: 74 mg/dL (ref <150)
VLDL: 15 mg/dL (ref 0–40)

## 2024-08-09 LAB — T3: T3, Total: 110 ng/dL (ref 71–180)

## 2024-08-09 LAB — GLUCOSE, CAPILLARY
Glucose-Capillary: 125 mg/dL — ABNORMAL HIGH (ref 70–99)
Glucose-Capillary: 152 mg/dL — ABNORMAL HIGH (ref 70–99)

## 2024-08-09 MED ORDER — NITROGLYCERIN 0.4 MG SL SUBL
0.8000 mg | SUBLINGUAL_TABLET | Freq: Once | SUBLINGUAL | Status: AC
  Administered 2024-08-09: 0.8 mg via SUBLINGUAL

## 2024-08-09 MED ORDER — IOHEXOL 350 MG/ML SOLN
100.0000 mL | Freq: Once | INTRAVENOUS | Status: AC | PRN
Start: 2024-08-09 — End: 2024-08-09
  Administered 2024-08-09: 100 mL via INTRAVENOUS

## 2024-08-09 NOTE — Progress Notes (Signed)
 [facility] Progress Note  Patient: Jared [name]     FMW:[id number]  DOA: 08/07/2024   PCP: Shepard Ade, MD       Brief hospital course: This is a 73 year old male with coronary artery disease, PAD, parkinsons disease, history of renal cell carcinoma, open nephrectomy, esophageal stricture, sleep apnea, GERD and depression who presented to the hospital with multiple complaints including chest pain, cough and abdominal pain.  He was diagnosed with diverticulitis about 2 weeks prior and was treated in the hospital from 10/27 through 10/30 after failing outpatient antibiotics.  He was discharged home with Keflex  and Flagyl  for 7 more days. In the ED: He was found to have a WBC count of 11.9, troponin of 33  CT of the abdomen pelvis revealed persistent diverticulitis and possible wall abscess versus diverticulum.  Also noted were increasing pulmonary nodules. The patient was admitted for treatment of diverticulitis and a surgery consult was requested.  Subjective:  Left lower abdominal pain is 2 out of 10 today.  Tolerating clear liquid diet.  Assessment and Plan: Principal Problem:   Diverticulitis - Currently receiving cefepime  and Flagyl  - Per discharge summary, the patient was discharged on cephalexin  and metronidazole  and he states that he completed the medication - Appreciate general surgery eval - Continue Norco and IV Dilaudid  as needed for pain - Advance diet per general surgery - DC IV fluids as he is drinking well and urinating well  Active Problems:  Elevated troponin-complaint of chest pain -Pain resolved - Remote history of RCA stenting-evaluation of the stent in 2011 revealed a patent stent - Chronically on aspirin  and Plavix  for peripheral artery disease - Troponin trend is flat with troponins remaining in the 30s - 2D echo 11/20> EF 60-65%, G 1 diastolic dysfunction, no WMA - coronary CT ordered by cardiology- prednisone  given due to contrast  allergy  Carotid artery disease/peripheral artery disease - Has bilateral carotid artery disease with a right ICA showing 40 to 59% stenosis - History of right TPT trunk balloon angioplasty  Parkinson's disease - Continue Sinemet    Right sided shoulder and arm pain - Describes burning in his right forearm and pain in his right posterior upper back/shoulder - Outpatient workup with MRI ordered by neurosurgery  Low TSH - TSH level is 0.322-recommend outpatient follow-up when not acutely ill    Code Status: Full Code Total time on patient care: 45 minutes DVT prophylaxis:  SCDs Start: 08/07/24 2004     Objective:   Vitals:   08/08/24 1927 08/08/24 2021 08/09/24 0205 08/09/24 0524  BP:  128/66 (!) 147/82 (!) 153/82  Pulse:  76 73 69  Resp:  15 15 15   Temp:  97.6 F (36.4 C) 98.3 F (36.8 C) (!) 97.4 F (36.3 C)  TempSrc:  Oral Oral Oral  SpO2: 96% 94% 96% 96%  Weight:      Height:       Filed Weights   08/07/24 1255  Weight: 89.4 kg   Exam: General exam: Appears comfortable  HEENT: oral mucosa moist Respiratory system: Clear to auscultation.  Cardiovascular system: S1 & S2 heard  Gastrointestinal system: Abdomen soft, no abdominal tenderness today, nondistended. Normal bowel sounds   Extremities: No cyanosis, clubbing or edema Psychiatry:  Mood & affect appropriate.      CBC: Recent Labs  Lab 08/07/24 1035 08/08/24 0844 08/09/24 0444  WBC 11.9* 12.1* 8.3  HGB 13.8 12.9* 12.6*  HCT 43.7 41.4 40.1  MCV 83.1 84.1 83.9  PLT  316 273 270   Basic Metabolic Panel: Recent Labs  Lab 08/07/24 1035 08/07/24 2206 08/08/24 0844  NA 141  --  139  K 4.2  --  3.9  CL 105  --  106  CO2 22  --  22  GLUCOSE 132*  --  85  BUN 20  --  19  CREATININE 1.13  --  1.01  CALCIUM  9.5  --  9.1  MG  --  2.2 2.3  PHOS  --  3.0 3.3     Scheduled Meds:  budesonide -glycopyrrolate -formoterol   2 puff Inhalation BID   carbidopa -levodopa   1 tablet Oral TID with meals    diphenhydrAMINE   50 mg Oral Once   DULoxetine   60 mg Oral q morning   finasteride   5 mg Oral QHS   gabapentin   100 mg Oral TID   insulin  aspart  0-9 Units Subcutaneous TID WC   levothyroxine   75 mcg Oral Q0600   metoprolol  tartrate  100 mg Oral Once   montelukast   10 mg Oral QHS   pantoprazole  (PROTONIX ) IV  40 mg Intravenous Q12H   predniSONE   50 mg Oral Q6H   tamsulosin   0.4 mg Oral QHS    Imaging and lab data personally reviewed   Author: Ashtynn Berke  08/09/2024 7:14 AM  To contact [facility]>   Check the care team in [facility] and look for the attending/consulting [facility] provider listed  Log into [email or web address] and use [place]'s universal password   Go to> [facility]  and find provider  If you still have difficulty reaching the provider, please page the [facility] (Director on Call) for the Hospitalists listed on amion

## 2024-08-09 NOTE — Progress Notes (Signed)
 Mobility Specialist Progress Note:   08/09/24 1012  Mobility  Activity Ambulated with assistance  Level of Assistance Modified independent, requires aide device or extra time  Assistive Device  (IV Pole)  Distance Ambulated (ft) 190 ft  Activity Response Tolerated well  Mobility Referral Yes  Mobility visit 1 Mobility  Mobility Specialist Start Time (ACUTE ONLY) 0941  Mobility Specialist Stop Time (ACUTE ONLY) 1003  Mobility Specialist Time Calculation (min) (ACUTE ONLY) 22 min   Pt was received in bed and agreed to mobility. No complaints during ambulation. All needs were met at the end of the session. Taken by [employer] for a CT scan.  [employer] - Mobility Specialist

## 2024-08-09 NOTE — Progress Notes (Addendum)
 Subjective: Pain has resolved.  Feels significantly better.  Tolerating CLD with no issues.  ROS: See above, otherwise other systems negative  Objective: Vital signs in last 24 hours: Temp:  [97.4 F (36.3 C)-98.3 F (36.8 C)] 97.4 F (36.3 C) (11/21 0524) Pulse Rate:  [69-104] 69 (11/21 0524) Resp:  [15-16] 15 (11/21 0524) BP: (127-153)/(58-82) 153/82 (11/21 0524) SpO2:  [94 %-100 %] 96 % (11/21 0524) Last BM Date : 08/08/24  Intake/Output from previous day: 11/20 0701 - 11/21 0700 In: 2131.5 [P.O.:660; I.V.:1203.4; IV Piggyback:268.1] Out: -  Intake/Output this shift: No intake/output data recorded.  PE: Abd: soft, NT, ND, +BS  Lab Results:  Recent Labs    08/08/24 0844 08/09/24 0444  WBC 12.1* 8.3  HGB 12.9* 12.6*  HCT 41.4 40.1  PLT 273 270   BMET Recent Labs    08/07/24 1035 08/08/24 0844  NA 141 139  K 4.2 3.9  CL 105 106  CO2 22 22  GLUCOSE 132* 85  BUN 20 19  CREATININE 1.13 1.01  CALCIUM  9.5 9.1   PT/INR Recent Labs    08/07/24 2140  LABPROT 13.4  INR 1.0   CMP     Component Value Date/Time   NA 139 08/08/2024 0844   NA 140 10/03/2023 1645   K 3.9 08/08/2024 0844   CL 106 08/08/2024 0844   CO2 22 08/08/2024 0844   GLUCOSE 85 08/08/2024 0844   BUN 19 08/08/2024 0844   BUN 23 10/03/2023 1645   CREATININE 1.01 08/08/2024 0844   CREATININE 1.41 (H) 06/25/2013 1051   CALCIUM  9.1 08/08/2024 0844   PROT 7.6 08/08/2024 0844   PROT 7.5 07/11/2023 0903   ALBUMIN 4.0 08/08/2024 0844   ALBUMIN 4.2 07/11/2023 0903   AST 20 08/08/2024 0844   ALT 10 08/08/2024 0844   ALKPHOS 78 08/08/2024 0844   BILITOT 0.5 08/08/2024 0844   BILITOT 0.3 07/11/2023 0903   GFRNONAA >60 08/08/2024 0844   GFRNONAA 59.0 01/01/2024 1238   GFRAA 56 (L) 10/28/2020 1127   Lipase     Component Value Date/Time   LIPASE 15 07/18/2024 2208       Studies/Results: CT CORONARY MORPH W/CTA COR W/SCORE W/CA W/CM &/OR WO/CM Result Date:  08/09/2024 CLINICAL DATA:  73 year old with known CAD status post RCA PCI in the year 2000 with prior 30% left main on left heart catheterization in 2011 being evaluated with chest pain. EXAM: Cardiac/Coronary  CTA TECHNIQUE: The patient was scanned on a [employer]. FINDINGS: A 120 kV prospective scan was triggered in the descending thoracic aorta at 111 HU's. Axial non-contrast 3 mm slices were carried out through the heart. The data set was analyzed on a dedicated work station and scored using the Agatson method. Gantry rotation speed was 250 msecs and collimation was .6 mm. 0.8 mg of sl NTG was given. The 3D data set was reconstructed in 5% intervals of the 67-82 % of the R-R cycle. Diastolic phases were analyzed on a dedicated work station using MPR, MIP and VRT modes. The patient received 80 cc of contrast. Image quality: Good, mild slab artifact Aorta:  Normal size no calcifications.  No dissection. Aortic Valve:  Trileaflet.  No calcifications. Coronary Arteries:  Normal coronary origin.  Right dominance. RCA is a large dominant artery that gives rise to PDA and PLA. Previously placed proximal to mid RCA stent appears widely patent. Distal calcified plaque with minimal luminal irregularities. Left main is  a large artery that gives rise to LAD and LCX arteries. There is distal calcified plaque with stable 20-30% stenosis when compared to prior cardiac catheterization in 2011. LAD is a large vessel that has mild scattered calcified plaque. There is noncalcified plaque in the proximal region with 30-49% stenosis. Does not appear to be flow-limiting. D1-large first diagonal with minimal luminal irregularities Ramus large branch with minimal luminal irregularities LCX is a non-dominant artery. This is a small-caliber vessel with no significant proximal stenosis. First OM does not fill well proximally but appears very small in caliber. Other findings: Normal pulmonary vein drainage into the left  atrium. Normal left atrial appendage without a thrombus. Minimally dilated pulmonary artery, 33 mm. Please see radiology report for non cardiac findings. IMPRESSION: 1. Coronary calcium  score of 30. This was 19 percentile for age and sex matched control. 2.  Normal coronary origin with right dominance. 3. Stable distal left main calcified plaque of 20-30% when compared to cardiac catheterization in 2011 report. 4. Mild stenosis of proximal LAD noncalcified plaque 30-49%. Does not appear to be flow-limiting 5.  Minimally dilated pulmonary artery 33 mm 6. Previously placed proximal to mid RCA stent appears widely patent. Electronically Signed   By: Oneil Parchment M.D.   On: 08/09/2024 11:34   ECHOCARDIOGRAM COMPLETE Result Date: 08/08/2024    ECHOCARDIOGRAM REPORT   Patient Name:   Jared [name] Date of Exam: 08/08/2024 Medical Rec #:  [id number]         Height:       67.0 in Accession #:    [id number]        Weight:       197.1 lb Date of Birth:  [birth year]         BSA:          2.010 m Patient Age:    73 years          BP:           142/73 mmHg Patient Gender: M                 HR:           76 bpm. Exam Location:  Inpatient Procedure: 2D Echo (Both Spectral and Color Flow Doppler were utilized during            procedure). Indications:    Chest pain  History:        Patient has prior history of Echocardiogram examinations. CAD.  Sonographer:    Charmaine Gaskins Referring Phys: [id number] ANASTASSIA DOUTOVA IMPRESSIONS  1. Left ventricular ejection fraction, by estimation, is 60 to 65%. The left ventricle has normal function. The left ventricle has no regional wall motion abnormalities. There is mild left ventricular hypertrophy. Left ventricular diastolic parameters are consistent with Grade I diastolic dysfunction (impaired relaxation).  2. Right ventricular systolic function is normal. The right ventricular size is normal. There is normal pulmonary artery systolic pressure. The estimated right ventricular systolic  pressure is 35.3 mmHg.  3. Left atrial size was mildly dilated.  4. The mitral valve is grossly normal. Mild mitral valve regurgitation. No evidence of mitral stenosis.  5. The aortic valve is tricuspid. Aortic valve regurgitation is mild to moderate. No aortic stenosis is present.  6. The inferior vena cava is normal in size with greater than 50% respiratory variability, suggesting right atrial pressure of 3 mmHg. FINDINGS  Left Ventricle: Left ventricular ejection fraction, by estimation, is 60 to 65%. The left ventricle  has normal function. The left ventricle has no regional wall motion abnormalities. The left ventricular internal cavity size was normal in size. There is  mild left ventricular hypertrophy. Left ventricular diastolic parameters are consistent with Grade I diastolic dysfunction (impaired relaxation). Right Ventricle: The right ventricular size is normal. No increase in right ventricular wall thickness. Right ventricular systolic function is normal. There is normal pulmonary artery systolic pressure. The tricuspid regurgitant velocity is 2.84 m/s, and  with an assumed right atrial pressure of 3 mmHg, the estimated right ventricular systolic pressure is 35.3 mmHg. Left Atrium: Left atrial size was mildly dilated. Right Atrium: Right atrial size was normal in size. Pericardium: There is no evidence of pericardial effusion. Mitral Valve: The mitral valve is grossly normal. Mild mitral valve regurgitation. No evidence of mitral valve stenosis. MV peak gradient, 4.9 mmHg. The mean mitral valve gradient is 2.0 mmHg. Tricuspid Valve: The tricuspid valve is normal in structure. Tricuspid valve regurgitation is trivial. No evidence of tricuspid stenosis. Aortic Valve: The aortic valve is tricuspid. Aortic valve regurgitation is mild to moderate. Aortic regurgitation PHT measures 391 msec. No aortic stenosis is present. Aortic valve mean gradient measures 6.0 mmHg. Aortic valve peak gradient measures 11.0   mmHg. Aortic valve area, by VTI measures 3.09 cm. Pulmonic Valve: The pulmonic valve was normal in structure. Pulmonic valve regurgitation is not visualized. No evidence of pulmonic stenosis. Aorta: The aortic root is normal in size and structure. Venous: The inferior vena cava is normal in size with greater than 50% respiratory variability, suggesting right atrial pressure of 3 mmHg. IAS/Shunts: No atrial level shunt detected by color flow Doppler.  LEFT VENTRICLE PLAX 2D LVIDd:         5.10 cm     Diastology LVIDs:         3.00 cm     LV e' medial:    5.77 cm/s LV PW:         1.00 cm     LV E/e' medial:  15.0 LV IVS:        1.00 cm     LV e' lateral:   8.16 cm/s LVOT diam:     2.30 cm     LV E/e' lateral: 10.6 LV SV:         115 LV SV Index:   57 LVOT Area:     4.15 cm  LV Volumes (MOD) LV vol d, MOD A2C: 81.3 ml LV vol d, MOD A4C: 92.0 ml LV vol s, MOD A2C: 32.9 ml LV vol s, MOD A4C: 32.4 ml LV SV MOD A2C:     48.4 ml LV SV MOD A4C:     92.0 ml LV SV MOD BP:      53.8 ml RIGHT VENTRICLE RV Basal diam:  3.30 cm RV Mid diam:    3.40 cm RV S prime:     11.90 cm/s LEFT ATRIUM             Index        RIGHT ATRIUM           Index LA diam:        4.30 cm 2.14 cm/m   RA Area:     15.40 cm LA Vol (A2C):   80.4 ml 40.01 ml/m  RA Volume:   36.50 ml  18.16 ml/m LA Vol (A4C):   74.8 ml 37.22 ml/m LA Biplane Vol: 79.3 ml 39.46 ml/m  AORTIC VALVE AV Area (Vmax):  3.03 cm AV Area (Vmean):   3.12 cm AV Area (VTI):     3.09 cm AV Vmax:           166.00 cm/s AV Vmean:          117.000 cm/s AV VTI:            0.373 m AV Peak Grad:      11.0 mmHg AV Mean Grad:      6.0 mmHg LVOT Vmax:         121.00 cm/s LVOT Vmean:        87.900 cm/s LVOT VTI:          0.277 m LVOT/AV VTI ratio: 0.74 AI PHT:            391 msec  AORTA Ao Root diam: 3.30 cm Ao Asc diam:  3.60 cm MITRAL VALVE                TRICUSPID VALVE MV Area (PHT): 4.89 cm     TR Peak grad:   32.3 mmHg MV Area VTI:   4.51 cm     TR Vmax:        284.00 cm/s MV  Peak grad:  4.9 mmHg MV Mean grad:  2.0 mmHg     SHUNTS MV Vmax:       1.11 m/s     Systemic VTI:  0.28 m MV Vmean:      57.8 cm/s    Systemic Diam: 2.30 cm MV E velocity: 86.30 cm/s MV A velocity: 116.00 cm/s MV E/A ratio:  0.74 Soyla Merck MD Electronically signed by Soyla Merck MD Signature Date/Time: 08/08/2024/4:32:15 PM    Final    CT Angio Chest PE W and/or Wo Contrast Result Date: 08/07/2024 CLINICAL DATA:  Acute abdominal pain.  High probability for PE. EXAM: CT ANGIOGRAPHY CHEST CT ABDOMEN AND PELVIS WITH CONTRAST TECHNIQUE: Multidetector CT imaging of the chest was performed using the standard protocol during bolus administration of intravenous contrast. Multiplanar CT image reconstructions and MIPs were obtained to evaluate the vascular anatomy. Multidetector CT imaging of the abdomen and pelvis was performed using the standard protocol during bolus administration of intravenous contrast. RADIATION DOSE REDUCTION: This exam was performed according to the departmental dose-optimization program which includes automated exposure control, adjustment of the mA and/or kV according to patient size and/or use of iterative reconstruction technique. CONTRAST:  OMNIPAQUE  IOHEXOL  350 MG/ML SOLN COMPARISON:  CT abdomen and pelvis 07/18/2024.  CT chest 06/05/2024. FINDINGS: CTA CHEST FINDINGS Cardiovascular: Satisfactory opacification of the pulmonary arteries to the segmental level. No evidence of pulmonary embolism. Normal heart size. No pericardial effusion. Mediastinum/Nodes: No enlarged mediastinal, hilar, or axillary lymph nodes. Thyroid  gland, trachea, and esophagus demonstrate no significant findings. Lungs/Pleura: There is some new scattered pulmonary nodules in the left lower lobe and inferior left upper lobe measuring up to 5 mm. There is no focal lung infiltrate. Pleural effusion or pneumothorax. Musculoskeletal: Review of the MIP images confirms the above findings. CT ABDOMEN and PELVIS  FINDINGS Hepatobiliary: There is a calcification in the left liver which is unchanged. No new liver lesions are seen. Gallbladder and bile ducts are within normal limits. Pancreas: Unremarkable. No pancreatic ductal dilatation or surrounding inflammatory changes. Spleen: Normal in size without focal abnormality. Adrenals/Urinary Tract: Left kidney is absent. Adrenal glands are within normal limits. Right renal cysts are present measuring up 2 4 cm. The bladder is within normal limits. Stomach/Bowel: There is sigmoid and descending  colon diverticulosis. There is some wall thickening and mild inflammation of the distal descending colon concerning for acute diverticulitis. This is in his similar location when compared to the prior study. On coronal image there is questionable large diverticulum versus wall abscess on image 7/93 measuring 3.8 x 2.2 by 3.5 cm. There is no evidence for bowel obstruction or free air. The appendix appears normal. Small bowel and stomach are within normal limits. Vascular/Lymphatic: Aortic atherosclerosis. No enlarged abdominal or pelvic lymph nodes. Reproductive: Prostate is unremarkable. Other: There are small fat containing bilateral inguinal hernias. Musculoskeletal: Lumbar spinal stimulator device present. Left hip arthroplasty present. No acute osseous abnormality. Review of the MIP images confirms the above findings. IMPRESSION: 1. No evidence for pulmonary embolism. 2. New scattered pulmonary nodules in the left lower lobe and inferior left upper lobe measuring up to 5 mm. Findings are likely infectious/inflammatory. No follow-up needed if patient is low-risk (and has no known or suspected primary neoplasm). Non-contrast chest CT can be considered in 12 months if patient is high-risk. This recommendation follows the consensus statement: Guidelines for Management of Incidental Pulmonary Nodules Detected on CT Images: From the Fleischner Society 2017; Radiology 2017; 284:228-243. 3.  Findings compatible with acute diverticulitis of the distal descending colon. There is questionable large diverticulum versus wall abscess measuring 3.8 x 2.2 x 3.5 cm. No free air. 4. Aortic atherosclerosis. Aortic Atherosclerosis (ICD10-I70.0). Electronically Signed   By: Greig Pique M.D.   On: 08/07/2024 17:50   CT ABDOMEN PELVIS W CONTRAST Result Date: 08/07/2024 CLINICAL DATA:  Acute abdominal pain.  High probability for PE. EXAM: CT ANGIOGRAPHY CHEST CT ABDOMEN AND PELVIS WITH CONTRAST TECHNIQUE: Multidetector CT imaging of the chest was performed using the standard protocol during bolus administration of intravenous contrast. Multiplanar CT image reconstructions and MIPs were obtained to evaluate the vascular anatomy. Multidetector CT imaging of the abdomen and pelvis was performed using the standard protocol during bolus administration of intravenous contrast. RADIATION DOSE REDUCTION: This exam was performed according to the departmental dose-optimization program which includes automated exposure control, adjustment of the mA and/or kV according to patient size and/or use of iterative reconstruction technique. CONTRAST:  OMNIPAQUE  IOHEXOL  350 MG/ML SOLN COMPARISON:  CT abdomen and pelvis 07/18/2024.  CT chest 06/05/2024. FINDINGS: CTA CHEST FINDINGS Cardiovascular: Satisfactory opacification of the pulmonary arteries to the segmental level. No evidence of pulmonary embolism. Normal heart size. No pericardial effusion. Mediastinum/Nodes: No enlarged mediastinal, hilar, or axillary lymph nodes. Thyroid  gland, trachea, and esophagus demonstrate no significant findings. Lungs/Pleura: There is some new scattered pulmonary nodules in the left lower lobe and inferior left upper lobe measuring up to 5 mm. There is no focal lung infiltrate. Pleural effusion or pneumothorax. Musculoskeletal: Review of the MIP images confirms the above findings. CT ABDOMEN and PELVIS FINDINGS Hepatobiliary: There is a  calcification in the left liver which is unchanged. No new liver lesions are seen. Gallbladder and bile ducts are within normal limits. Pancreas: Unremarkable. No pancreatic ductal dilatation or surrounding inflammatory changes. Spleen: Normal in size without focal abnormality. Adrenals/Urinary Tract: Left kidney is absent. Adrenal glands are within normal limits. Right renal cysts are present measuring up 2 4 cm. The bladder is within normal limits. Stomach/Bowel: There is sigmoid and descending colon diverticulosis. There is some wall thickening and mild inflammation of the distal descending colon concerning for acute diverticulitis. This is in his similar location when compared to the prior study. On coronal image there is questionable large diverticulum versus  wall abscess on image 7/93 measuring 3.8 x 2.2 by 3.5 cm. There is no evidence for bowel obstruction or free air. The appendix appears normal. Small bowel and stomach are within normal limits. Vascular/Lymphatic: Aortic atherosclerosis. No enlarged abdominal or pelvic lymph nodes. Reproductive: Prostate is unremarkable. Other: There are small fat containing bilateral inguinal hernias. Musculoskeletal: Lumbar spinal stimulator device present. Left hip arthroplasty present. No acute osseous abnormality. Review of the MIP images confirms the above findings. IMPRESSION: 1. No evidence for pulmonary embolism. 2. New scattered pulmonary nodules in the left lower lobe and inferior left upper lobe measuring up to 5 mm. Findings are likely infectious/inflammatory. No follow-up needed if patient is low-risk (and has no known or suspected primary neoplasm). Non-contrast chest CT can be considered in 12 months if patient is high-risk. This recommendation follows the consensus statement: Guidelines for Management of Incidental Pulmonary Nodules Detected on CT Images: From the Fleischner Society 2017; Radiology 2017; 284:228-243. 3. Findings compatible with acute  diverticulitis of the distal descending colon. There is questionable large diverticulum versus wall abscess measuring 3.8 x 2.2 x 3.5 cm. No free air. 4. Aortic atherosclerosis. Aortic Atherosclerosis (ICD10-I70.0). Electronically Signed   By: Greig Pique M.D.   On: 08/07/2024 17:50    Anti-infectives: Anti-infectives (From admission, onward)    Start     Dose/Rate Route Frequency Ordered Stop   08/08/24 0600  metroNIDAZOLE  (FLAGYL ) IVPB 500 mg       Placed in And Linked Group   500 mg 100 mL/hr over 60 Minutes Intravenous Every 12 hours 08/07/24 1930     08/08/24 0200  ceFEPIme  (MAXIPIME ) 2 g in sodium chloride  0.9 % 100 mL IVPB        2 g 200 mL/hr over 30 Minutes Intravenous Every 8 hours 08/07/24 1941     08/07/24 1945  ceFEPIme  (MAXIPIME ) 2 g in sodium chloride  0.9 % 100 mL IVPB  Status:  Discontinued       Placed in And Linked Group   2 g 200 mL/hr over 30 Minutes Intravenous  Once 08/07/24 1930 08/07/24 1940   08/07/24 1815  ceFEPIme  (MAXIPIME ) 2 g in sodium chloride  0.9 % 100 mL IVPB       Placed in And Linked Group   2 g 200 mL/hr over 30 Minutes Intravenous  Once 08/07/24 1811 08/07/24 1906   08/07/24 1815  metroNIDAZOLE  (FLAGYL ) IVPB 500 mg       Placed in And Linked Group   500 mg 100 mL/hr over 60 Minutes Intravenous  Once 08/07/24 1811 08/07/24 1930        Assessment/Plan Diverticulitis  -CT scan reviewed with radiology yesterday.  This area on CT is felt to be a large stool filled diverticulum, not an abscess -adv to FLD, then soft in am.  If he tolerates this then he can likely DC tomorrow if continues to do well -cont abx therapy.  Would treat a total of about 10 days -he will need GI follow up for c-scope, he is already followed by Dr. Leigh -no surgical follow up needed at this point.  We will be available tomorrow if needed.  FEN - FLD, Soft in am VTE - SCDs ID - Cefipime/Flagyl   I reviewed hospitalist notes, last 24 h vitals and pain  scores, last 48 h intake and output, last 24 h labs and trends, and last 24 h imaging results.   LOS: 2 days    Burnard FORBES Banter , [facility]  08/09/2024, 12:17 PM Please see Amion for pager number during day hours 7:00am-4:30pm or 7:00am -11:30am on weekends

## 2024-08-09 NOTE — Plan of Care (Signed)

## 2024-08-09 NOTE — Progress Notes (Signed)
  Progress Note  Patient Name: ESSIE GEHRET Date of Encounter: 08/09/2024 [facility] Cardiologist: Dorn Lesches, MD   Interval Summary   Had an episode severe abdominal pain overnight but no CP or dyspnea.  Vital Signs Vitals:   08/08/24 1927 08/08/24 2021 08/09/24 0205 08/09/24 0524  BP:  128/66 (!) 147/82 (!) 153/82  Pulse:  76 73 69  Resp:  15 15 15   Temp:  97.6 F (36.4 C) 98.3 F (36.8 C) (!) 97.4 F (36.3 C)  TempSrc:  Oral Oral Oral  SpO2: 96% 94% 96% 96%  Weight:      Height:        Intake/Output Summary (Last 24 hours) at 08/09/2024 0705 Last data filed at 08/09/2024 0610 Gross per 24 hour  Intake 2131.47 ml  Output --  Net 2131.47 ml      08/07/2024   12:55 PM 07/16/2024   12:07 AM 07/15/2024   10:23 AM  Last 3 Weights  Weight (lbs) 197 lb 1.5 oz 197 lb 198 lb 4 oz  Weight (kg) 89.4 kg 89.359 kg 89.926 kg      Telemetry/ECG  NSR - Personally Reviewed  Physical Exam  GEN: No acute distress.   Neck: No JVD Cardiac: RRR, no murmurs, rubs, or gallops.  Respiratory: Clear to auscultation bilaterally. GI: Soft, nontender, non-distended  MS: No edema  Assessment & Plan  Mr Zapf is a 1 yoM with Hx CAD s/p RCA PCI (2000), 30% LM on LHC (2011), PAD s/p RLE balloon angioplasty (03/25) who presented with CP similar to prior MI and found to have possible PNA and acute diverticulitis. Cardiology consulted for pre-op for colectomy. CG and troponin unremarkable. He had 30% LM lesion back in 2011. I do not suspect this is ACS but would need further evaluation given LM lesion. NST would not be helpful because of possible balance ischemia, and thus will proceed with coronary CTA.SABRA   #CP #CAD s/p RCA PCI #PAD s/p RLE balloon angioplasty - Denies any CP. TTE with EF 60-65% - Follow up with coronary CTA to assess LM; if no significant obstructive CAD, can proceed to surgery - Would re-start ASA and Plavix  - No statin given intolerance - Can  start metop XL on discharge given stable dyspnea suggestive angina equivalent - We will continue to follow  For questions or updates, please contact [facility] Please consult [email or web address] for contact info under  Signed, Joelle VEAR Ren Donley, MD

## 2024-08-10 DIAGNOSIS — K5792 Diverticulitis of intestine, part unspecified, without perforation or abscess without bleeding: Secondary | ICD-10-CM | POA: Diagnosis not present

## 2024-08-10 DIAGNOSIS — I10 Essential (primary) hypertension: Secondary | ICD-10-CM

## 2024-08-10 DIAGNOSIS — I739 Peripheral vascular disease, unspecified: Secondary | ICD-10-CM | POA: Diagnosis not present

## 2024-08-10 DIAGNOSIS — I2510 Atherosclerotic heart disease of native coronary artery without angina pectoris: Secondary | ICD-10-CM | POA: Diagnosis not present

## 2024-08-10 LAB — GLUCOSE, CAPILLARY
Glucose-Capillary: 104 mg/dL — ABNORMAL HIGH (ref 70–99)
Glucose-Capillary: 88 mg/dL (ref 70–99)

## 2024-08-10 MED ORDER — AMOXICILLIN-POT CLAVULANATE 875-125 MG PO TABS: 1.0000 | ORAL_TABLET | Freq: Two times a day (BID) | ORAL | 0 refills | Status: DC

## 2024-08-10 MED ORDER — CIPROFLOXACIN HCL 500 MG PO TABS: 500.0000 mg | ORAL_TABLET | Freq: Two times a day (BID) | ORAL | 0 refills | Status: AC

## 2024-08-10 MED ORDER — METRONIDAZOLE 500 MG PO TABS: 500.0000 mg | ORAL_TABLET | Freq: Three times a day (TID) | ORAL | 0 refills | Status: DC

## 2024-08-10 NOTE — Discharge Summary (Signed)
 Physician Discharge Summary  Jared [name] FMW:[id number] DOB: 11/14/1950 DOA: 08/07/2024  PCP: Shepard Ade, MD  Admit date: 08/07/2024 Discharge date: 08/10/2024 Discharging to: Home Recommendations for Outpatient Follow-up:  Will need follow-up with GI which I have discussed with him  Consults:  General Surgery Procedures:  None   Discharge Diagnoses:   Principal Problem:   Diverticulitis Active Problems:   Acute diverticulitis   Hypothyroidism   Chronic kidney disease, stage 3a (HCC)   GERD (gastroesophageal reflux disease)   BPH (benign prostatic hyperplasia)   Chest pain at rest   Essential hypertension   Parkinsonism (HCC)   OSA on CPAP   Pre-diabetes   Chronic obstructive pulmonary disease (HCC)   Type 2 diabetes mellitus without complications (HCC)   History of CAD (coronary artery disease)   PAD (peripheral artery disease)   Brief hospital course: This is a 73 year old male with coronary artery disease, PAD, parkinsons disease, history of renal cell carcinoma, open nephrectomy, esophageal stricture, sleep apnea, GERD and depression who presented to the hospital with multiple complaints including chest pain, cough and abdominal pain.  He was diagnosed with diverticulitis about 2 weeks prior and was treated in the hospital from 10/27 through 10/30 after failing outpatient antibiotics.  He was discharged home with Keflex  and Flagyl  for 7 more days. In the ED: He was found to have a WBC count of 11.9, troponin of 33  CT of the abdomen pelvis revealed persistent diverticulitis and possible wall abscess versus diverticulum.  Also noted were increasing pulmonary nodules. The patient was admitted for treatment of diverticulitis and a surgery consult was requested.   Subjective:  No longer having any abdominal pain   Assessment and Plan: Principal Problem:   Diverticulitis - Currently receiving cefepime  and Flagyl  - Per discharge summary, the patient was  discharged on cephalexin  and metronidazole  and he states that he completed the medication - Appreciate general surgery eval - Manage conservatively and diet advanced to solids today - Being transition to Augmentin  for total of 10 days - Will need outpatient follow-up with GI-patient is in agreement with this   Active Problems:   Elevated troponin-complaint of chest pain -Pain resolved - Remote history of RCA stenting-evaluation of the stent in 2011 revealed a patent stent - Chronically on aspirin  and Plavix  for peripheral artery disease - Troponin trend is flat with troponins remaining in the 30s - 2D echo 11/20> EF 60-65%, G 1 diastolic dysfunction, no WMA - coronary CT ordered by cardiology-did not reveal any stenosis   Carotid artery disease/peripheral artery disease - Has bilateral carotid artery disease with a right ICA showing 40 to 59% stenosis - History of right TPT trunk balloon angioplasty   Parkinson's disease - Continue Sinemet    Right sided shoulder and arm pain - Describes burning in his right forearm and pain in his right posterior upper back/shoulder - Outpatient workup with MRI ordered by neurosurgery   Low TSH - TSH level is 0.322-recommend outpatient follow-up when not acutely ill    Body mass index is 30.87 kg/m.         Discharge Instructions   Allergies as of 08/10/2024       Reactions   Azithromycin  Anaphylaxis, Other (See Comments)   Ulcers in mouth, nose, and ears - also   Iodinated Contrast Media Hives   Nsaids Other (See Comments)   Was told not to take due to pt only having 1 kidney   Penicillins Hives, Itching, Nausea And Vomiting, Other (See  Comments)   Tolerates Cefepime    Iohexol  Hives, Itching, Other (See Comments)   Per radiologist Dr. Scott - pt needs 13 hr prep. Pt was seen outpatient and given oral iohexol , broke out in hives and itching. Dr. Fujinaga was consulted 07/15/24   Codeine Nausea Only, Nausea And Vomiting    Penicillamine Rash   Pholcodine Rash   Statins Other (See Comments)   Myalgias   Sulfonamide Derivatives Hives, Rash        Medication List     TAKE these medications    albuterol  (2.5 MG/3ML) 0.083% nebulizer solution Commonly known as: PROVENTIL  Take 3 mLs (2.5 mg total) by nebulization every 6 (six) hours as needed for wheezing or shortness of breath.   albuterol  108 (90 Base) MCG/ACT inhaler Commonly known as: VENTOLIN  HFA Inhale 2 puffs into the lungs every 4 (four) hours as needed for wheezing or shortness of breath. Can inhale two puffs every four to six hours as needed for cough, wheeze, shortness of breath, or chest tightness.   amoxicillin -clavulanate 875-125 MG tablet Commonly known as: AUGMENTIN  Take 1 tablet by mouth 2 (two) times daily for 6 days.   ascorbic acid  500 MG tablet Commonly known as: VITAMIN C  Take 500 mg by mouth 2 (two) times daily.   aspirin  EC 81 MG tablet Take 1 tablet (81 mg total) by mouth daily. Swallow whole.   carbidopa -levodopa  25-100 MG tablet Commonly known as: SINEMET  IR Take 1 tablet by mouth with breakfast, with lunch, and with evening meal.   clopidogrel  75 MG tablet Commonly known as: PLAVIX  TAKE 1 TABLET BY MOUTH DAILY WITH BREAKFAST   CO Q-10 PO Take 1 capsule by mouth daily.   DULoxetine  60 MG capsule Commonly known as: CYMBALTA  Take 60 mg by mouth every morning.   EPINEPHrine  0.3 mg/0.3 mL Soaj injection Commonly known as: EPI-PEN Inject 0.3 mg into the muscle as needed for anaphylaxis.   finasteride  5 MG tablet Commonly known as: PROSCAR  Take 5 mg by mouth at bedtime.   gabapentin  100 MG capsule Commonly known as: NEURONTIN  Take 100 mg by mouth 3 (three) times daily.   guaiFENesin  100 MG/5ML liquid Commonly known as: ROBITUSSIN Take 10 mLs by mouth every 6 (six) hours as needed for cough or to loosen phlegm.   levothyroxine  75 MCG tablet Commonly known as: SYNTHROID  Take 75 mcg by mouth daily before  breakfast.   montelukast  10 MG tablet Commonly known as: SINGULAIR  Take 10 mg by mouth at bedtime.   multivitamin tablet Take 1 tablet by mouth daily with breakfast.   Nucala  100 MG/ML Sosy Generic drug: mepolizumab  Inject 100 mg into the skin every 30 (thirty) days.   OVER THE COUNTER MEDICATION Place 1 drop into both eyes See admin instructions. Rohto Max Strength Redness Reliever/Redness & Dry Eye Symptom Relief Lubricant Eye Drops - Instill 1 drop into both eyes every 8 hours as needed for dryness   Ozempic (1 MG/DOSE) 4 MG/3ML Sopn Generic drug: Semaglutide (1 MG/DOSE) Inject 1 mg into the skin every Friday.   pantoprazole  40 MG tablet Commonly known as: PROTONIX  TAKE 1 TABLET BY MOUTH DAILY What changed: when to take this   polyethylene glycol 17 g packet Commonly known as: MIRALAX  / GLYCOLAX  Take 17 g by mouth daily.   Repatha  SureClick 140 MG/ML Soaj Generic drug: Evolocumab  INJECT 1 PEN INTO THE SKIN EVERY 14 DAYS What changed: See the new instructions.   sodium chloride  0.65 % Soln nasal spray Commonly known as:  OCEAN Place 1 spray into both nostrils as needed for congestion.   tamsulosin  0.4 MG Caps capsule Commonly known as: FLOMAX  Take 0.4 mg by mouth at bedtime.   Trelegy Ellipta 100-62.5-25 MCG/ACT Aepb Generic drug: Fluticasone -Umeclidin-Vilant Take 1 puff by mouth daily.   zinc  gluconate 50 MG tablet Take 50 mg by mouth in the morning and at bedtime.        Follow-up Information     Schedule an appointment as soon as possible for a visit  with Shepard Ade, MD.   Specialty: Internal Medicine Contact information: [address] [place] [address] [phone]                    The results of significant diagnostics from this hospitalization (including imaging, microbiology, ancillary and laboratory) are listed below for reference.    CT CORONARY MORPH W/CTA COR W/SCORE W/CA W/CM &/OR WO/CM Addendum Date:  08/09/2024 ADDENDUM REPORT: 08/09/2024 15:29 EXAM: OVER-READ INTERPRETATION  CT CHEST The following report is an over-read performed by radiologist Dr. Oneil Devonshire of [facility] on 08/09/2024. This over-read does not include interpretation of cardiac or coronary anatomy or pathology. The coronary calcium  score/coronary CTA interpretation by the cardiologist is attached. COMPARISON:  None. FINDINGS: Cardiovascular: There are no significant extracardiac vascular findings. Mediastinum/Nodes: There are no enlarged lymph nodes within the visualized mediastinum. Lungs/Pleura: There is no pleural effusion. Mild scarring is noted right lower lobe. Upper abdomen: No significant findings in the visualized upper abdomen. Musculoskeletal/Chest wall: No chest wall mass or suspicious osseous findings within the visualized chest. IMPRESSION: No significant extracardiac findings within the visualized chest. Electronically Signed   By: Oneil Devonshire M.D.   On: 08/09/2024 15:29   Result Date: 08/09/2024 CLINICAL DATA:  73 year old with known CAD status post RCA PCI in the year 2000 with prior 30% left main on left heart catheterization in 2011 being evaluated with chest pain. EXAM: Cardiac/Coronary  CTA TECHNIQUE: The patient was scanned on a [employer]. FINDINGS: A 120 kV prospective scan was triggered in the descending thoracic aorta at 111 HU's. Axial non-contrast 3 mm slices were carried out through the heart. The data set was analyzed on a dedicated work station and scored using the Agatson method. Gantry rotation speed was 250 msecs and collimation was .6 mm. 0.8 mg of sl NTG was given. The 3D data set was reconstructed in 5% intervals of the 67-82 % of the R-R cycle. Diastolic phases were analyzed on a dedicated work station using MPR, MIP and VRT modes. The patient received 80 cc of contrast. Image quality: Good, mild slab artifact Aorta:  Normal size no calcifications.  No dissection. Aortic  Valve:  Trileaflet.  No calcifications. Coronary Arteries:  Normal coronary origin.  Right dominance. RCA is a large dominant artery that gives rise to PDA and PLA. Previously placed proximal to mid RCA stent appears widely patent. Distal calcified plaque with minimal luminal irregularities. Left main is a large artery that gives rise to LAD and LCX arteries. There is distal calcified plaque with stable 20-30% stenosis when compared to prior cardiac catheterization in 2011. LAD is a large vessel that has mild scattered calcified plaque. There is noncalcified plaque in the proximal region with 30-49% stenosis. Does not appear to be flow-limiting. D1-large first diagonal with minimal luminal irregularities Ramus large branch with minimal luminal irregularities LCX is a non-dominant artery. This is a small-caliber vessel with no significant proximal stenosis. First OM does not  fill well proximally but appears very small in caliber. Other findings: Normal pulmonary vein drainage into the left atrium. Normal left atrial appendage without a thrombus. Minimally dilated pulmonary artery, 33 mm. Please see radiology report for non cardiac findings. IMPRESSION: 1. Coronary calcium  score of 30. This was 19 percentile for age and sex matched control. 2.  Normal coronary origin with right dominance. 3. Stable distal left main calcified plaque of 20-30% when compared to cardiac catheterization in 2011 report. 4. Mild stenosis of proximal LAD noncalcified plaque 30-49%. Does not appear to be flow-limiting 5.  Minimally dilated pulmonary artery 33 mm 6. Previously placed proximal to mid RCA stent appears widely patent. Electronically Signed: By: Oneil Parchment M.D. On: 08/09/2024 11:34   ECHOCARDIOGRAM COMPLETE Result Date: 08/08/2024    ECHOCARDIOGRAM REPORT   Patient Name:   KYREL LEIGHTON Date of Exam: 08/08/2024 Medical Rec #:  [id number]         Height:       67.0 in Accession #:    [id number]        Weight:       197.1 lb  Date of Birth:  04/29/1951         BSA:          2.010 m Patient Age:    73 years          BP:           142/73 mmHg Patient Gender: M                 HR:           76 bpm. Exam Location:  Inpatient Procedure: 2D Echo (Both Spectral and Color Flow Doppler were utilized during            procedure). Indications:    Chest pain  History:        Patient has prior history of Echocardiogram examinations. CAD.  Sonographer:    Charmaine Gaskins Referring Phys: [id number] ANASTASSIA DOUTOVA IMPRESSIONS  1. Left ventricular ejection fraction, by estimation, is 60 to 65%. The left ventricle has normal function. The left ventricle has no regional wall motion abnormalities. There is mild left ventricular hypertrophy. Left ventricular diastolic parameters are consistent with Grade I diastolic dysfunction (impaired relaxation).  2. Right ventricular systolic function is normal. The right ventricular size is normal. There is normal pulmonary artery systolic pressure. The estimated right ventricular systolic pressure is 35.3 mmHg.  3. Left atrial size was mildly dilated.  4. The mitral valve is grossly normal. Mild mitral valve regurgitation. No evidence of mitral stenosis.  5. The aortic valve is tricuspid. Aortic valve regurgitation is mild to moderate. No aortic stenosis is present.  6. The inferior vena cava is normal in size with greater than 50% respiratory variability, suggesting right atrial pressure of 3 mmHg. FINDINGS  Left Ventricle: Left ventricular ejection fraction, by estimation, is 60 to 65%. The left ventricle has normal function. The left ventricle has no regional wall motion abnormalities. The left ventricular internal cavity size was normal in size. There is  mild left ventricular hypertrophy. Left ventricular diastolic parameters are consistent with Grade I diastolic dysfunction (impaired relaxation). Right Ventricle: The right ventricular size is normal. No increase in right ventricular wall thickness. Right  ventricular systolic function is normal. There is normal pulmonary artery systolic pressure. The tricuspid regurgitant velocity is 2.84 m/s, and  with an assumed right atrial pressure of 3 mmHg, the estimated right ventricular systolic  pressure is 35.3 mmHg. Left Atrium: Left atrial size was mildly dilated. Right Atrium: Right atrial size was normal in size. Pericardium: There is no evidence of pericardial effusion. Mitral Valve: The mitral valve is grossly normal. Mild mitral valve regurgitation. No evidence of mitral valve stenosis. MV peak gradient, 4.9 mmHg. The mean mitral valve gradient is 2.0 mmHg. Tricuspid Valve: The tricuspid valve is normal in structure. Tricuspid valve regurgitation is trivial. No evidence of tricuspid stenosis. Aortic Valve: The aortic valve is tricuspid. Aortic valve regurgitation is mild to moderate. Aortic regurgitation PHT measures 391 msec. No aortic stenosis is present. Aortic valve mean gradient measures 6.0 mmHg. Aortic valve peak gradient measures 11.0  mmHg. Aortic valve area, by VTI measures 3.09 cm. Pulmonic Valve: The pulmonic valve was normal in structure. Pulmonic valve regurgitation is not visualized. No evidence of pulmonic stenosis. Aorta: The aortic root is normal in size and structure. Venous: The inferior vena cava is normal in size with greater than 50% respiratory variability, suggesting right atrial pressure of 3 mmHg. IAS/Shunts: No atrial level shunt detected by color flow Doppler.  LEFT VENTRICLE PLAX 2D LVIDd:         5.10 cm     Diastology LVIDs:         3.00 cm     LV e' medial:    5.77 cm/s LV PW:         1.00 cm     LV E/e' medial:  15.0 LV IVS:        1.00 cm     LV e' lateral:   8.16 cm/s LVOT diam:     2.30 cm     LV E/e' lateral: 10.6 LV SV:         115 LV SV Index:   57 LVOT Area:     4.15 cm  LV Volumes (MOD) LV vol d, MOD A2C: 81.3 ml LV vol d, MOD A4C: 92.0 ml LV vol s, MOD A2C: 32.9 ml LV vol s, MOD A4C: 32.4 ml LV SV MOD A2C:     48.4 ml LV SV  MOD A4C:     92.0 ml LV SV MOD BP:      53.8 ml RIGHT VENTRICLE RV Basal diam:  3.30 cm RV Mid diam:    3.40 cm RV S prime:     11.90 cm/s LEFT ATRIUM             Index        RIGHT ATRIUM           Index LA diam:        4.30 cm 2.14 cm/m   RA Area:     15.40 cm LA Vol (A2C):   80.4 ml 40.01 ml/m  RA Volume:   36.50 ml  18.16 ml/m LA Vol (A4C):   74.8 ml 37.22 ml/m LA Biplane Vol: 79.3 ml 39.46 ml/m  AORTIC VALVE AV Area (Vmax):    3.03 cm AV Area (Vmean):   3.12 cm AV Area (VTI):     3.09 cm AV Vmax:           166.00 cm/s AV Vmean:          117.000 cm/s AV VTI:            0.373 m AV Peak Grad:      11.0 mmHg AV Mean Grad:      6.0 mmHg LVOT Vmax:         121.00 cm/s  LVOT Vmean:        87.900 cm/s LVOT VTI:          0.277 m LVOT/AV VTI ratio: 0.74 AI PHT:            391 msec  AORTA Ao Root diam: 3.30 cm Ao Asc diam:  3.60 cm MITRAL VALVE                TRICUSPID VALVE MV Area (PHT): 4.89 cm     TR Peak grad:   32.3 mmHg MV Area VTI:   4.51 cm     TR Vmax:        284.00 cm/s MV Peak grad:  4.9 mmHg MV Mean grad:  2.0 mmHg     SHUNTS MV Vmax:       1.11 m/s     Systemic VTI:  0.28 m MV Vmean:      57.8 cm/s    Systemic Diam: 2.30 cm MV E velocity: 86.30 cm/s MV A velocity: 116.00 cm/s MV E/A ratio:  0.74 Soyla Merck MD Electronically signed by Soyla Merck MD Signature Date/Time: 08/08/2024/4:32:15 PM    Final    CT Angio Chest PE W and/or Wo Contrast Result Date: 08/07/2024 CLINICAL DATA:  Acute abdominal pain.  High probability for PE. EXAM: CT ANGIOGRAPHY CHEST CT ABDOMEN AND PELVIS WITH CONTRAST TECHNIQUE: Multidetector CT imaging of the chest was performed using the standard protocol during bolus administration of intravenous contrast. Multiplanar CT image reconstructions and MIPs were obtained to evaluate the vascular anatomy. Multidetector CT imaging of the abdomen and pelvis was performed using the standard protocol during bolus administration of intravenous contrast. RADIATION DOSE  REDUCTION: This exam was performed according to the departmental dose-optimization program which includes automated exposure control, adjustment of the mA and/or kV according to patient size and/or use of iterative reconstruction technique. CONTRAST:  OMNIPAQUE  IOHEXOL  350 MG/ML SOLN COMPARISON:  CT abdomen and pelvis 07/18/2024.  CT chest 06/05/2024. FINDINGS: CTA CHEST FINDINGS Cardiovascular: Satisfactory opacification of the pulmonary arteries to the segmental level. No evidence of pulmonary embolism. Normal heart size. No pericardial effusion. Mediastinum/Nodes: No enlarged mediastinal, hilar, or axillary lymph nodes. Thyroid  gland, trachea, and esophagus demonstrate no significant findings. Lungs/Pleura: There is some new scattered pulmonary nodules in the left lower lobe and inferior left upper lobe measuring up to 5 mm. There is no focal lung infiltrate. Pleural effusion or pneumothorax. Musculoskeletal: Review of the MIP images confirms the above findings. CT ABDOMEN and PELVIS FINDINGS Hepatobiliary: There is a calcification in the left liver which is unchanged. No new liver lesions are seen. Gallbladder and bile ducts are within normal limits. Pancreas: Unremarkable. No pancreatic ductal dilatation or surrounding inflammatory changes. Spleen: Normal in size without focal abnormality. Adrenals/Urinary Tract: Left kidney is absent. Adrenal glands are within normal limits. Right renal cysts are present measuring up 2 4 cm. The bladder is within normal limits. Stomach/Bowel: There is sigmoid and descending colon diverticulosis. There is some wall thickening and mild inflammation of the distal descending colon concerning for acute diverticulitis. This is in his similar location when compared to the prior study. On coronal image there is questionable large diverticulum versus wall abscess on image 7/93 measuring 3.8 x 2.2 by 3.5 cm. There is no evidence for bowel obstruction or free air. The appendix  appears normal. Small bowel and stomach are within normal limits. Vascular/Lymphatic: Aortic atherosclerosis. No enlarged abdominal or pelvic lymph nodes. Reproductive: Prostate is unremarkable. Other: There are small  fat containing bilateral inguinal hernias. Musculoskeletal: Lumbar spinal stimulator device present. Left hip arthroplasty present. No acute osseous abnormality. Review of the MIP images confirms the above findings. IMPRESSION: 1. No evidence for pulmonary embolism. 2. New scattered pulmonary nodules in the left lower lobe and inferior left upper lobe measuring up to 5 mm. Findings are likely infectious/inflammatory. No follow-up needed if patient is low-risk (and has no known or suspected primary neoplasm). Non-contrast chest CT can be considered in 12 months if patient is high-risk. This recommendation follows the consensus statement: Guidelines for Management of Incidental Pulmonary Nodules Detected on CT Images: From the Fleischner Society 2017; Radiology 2017; 284:228-243. 3. Findings compatible with acute diverticulitis of the distal descending colon. There is questionable large diverticulum versus wall abscess measuring 3.8 x 2.2 x 3.5 cm. No free air. 4. Aortic atherosclerosis. Aortic Atherosclerosis (ICD10-I70.0). Electronically Signed   By: Greig Pique M.D.   On: 08/07/2024 17:50   CT ABDOMEN PELVIS W CONTRAST Result Date: 08/07/2024 CLINICAL DATA:  Acute abdominal pain.  High probability for PE. EXAM: CT ANGIOGRAPHY CHEST CT ABDOMEN AND PELVIS WITH CONTRAST TECHNIQUE: Multidetector CT imaging of the chest was performed using the standard protocol during bolus administration of intravenous contrast. Multiplanar CT image reconstructions and MIPs were obtained to evaluate the vascular anatomy. Multidetector CT imaging of the abdomen and pelvis was performed using the standard protocol during bolus administration of intravenous contrast. RADIATION DOSE REDUCTION: This exam was performed  according to the departmental dose-optimization program which includes automated exposure control, adjustment of the mA and/or kV according to patient size and/or use of iterative reconstruction technique. CONTRAST:  OMNIPAQUE  IOHEXOL  350 MG/ML SOLN COMPARISON:  CT abdomen and pelvis 07/18/2024.  CT chest 06/05/2024. FINDINGS: CTA CHEST FINDINGS Cardiovascular: Satisfactory opacification of the pulmonary arteries to the segmental level. No evidence of pulmonary embolism. Normal heart size. No pericardial effusion. Mediastinum/Nodes: No enlarged mediastinal, hilar, or axillary lymph nodes. Thyroid  gland, trachea, and esophagus demonstrate no significant findings. Lungs/Pleura: There is some new scattered pulmonary nodules in the left lower lobe and inferior left upper lobe measuring up to 5 mm. There is no focal lung infiltrate. Pleural effusion or pneumothorax. Musculoskeletal: Review of the MIP images confirms the above findings. CT ABDOMEN and PELVIS FINDINGS Hepatobiliary: There is a calcification in the left liver which is unchanged. No new liver lesions are seen. Gallbladder and bile ducts are within normal limits. Pancreas: Unremarkable. No pancreatic ductal dilatation or surrounding inflammatory changes. Spleen: Normal in size without focal abnormality. Adrenals/Urinary Tract: Left kidney is absent. Adrenal glands are within normal limits. Right renal cysts are present measuring up 2 4 cm. The bladder is within normal limits. Stomach/Bowel: There is sigmoid and descending colon diverticulosis. There is some wall thickening and mild inflammation of the distal descending colon concerning for acute diverticulitis. This is in his similar location when compared to the prior study. On coronal image there is questionable large diverticulum versus wall abscess on image 7/93 measuring 3.8 x 2.2 by 3.5 cm. There is no evidence for bowel obstruction or free air. The appendix appears normal. Small bowel and stomach  are within normal limits. Vascular/Lymphatic: Aortic atherosclerosis. No enlarged abdominal or pelvic lymph nodes. Reproductive: Prostate is unremarkable. Other: There are small fat containing bilateral inguinal hernias. Musculoskeletal: Lumbar spinal stimulator device present. Left hip arthroplasty present. No acute osseous abnormality. Review of the MIP images confirms the above findings. IMPRESSION: 1. No evidence for pulmonary embolism. 2. New scattered pulmonary nodules  in the left lower lobe and inferior left upper lobe measuring up to 5 mm. Findings are likely infectious/inflammatory. No follow-up needed if patient is low-risk (and has no known or suspected primary neoplasm). Non-contrast chest CT can be considered in 12 months if patient is high-risk. This recommendation follows the consensus statement: Guidelines for Management of Incidental Pulmonary Nodules Detected on CT Images: From the Fleischner Society 2017; Radiology 2017; 284:228-243. 3. Findings compatible with acute diverticulitis of the distal descending colon. There is questionable large diverticulum versus wall abscess measuring 3.8 x 2.2 x 3.5 cm. No free air. 4. Aortic atherosclerosis. Aortic Atherosclerosis (ICD10-I70.0). Electronically Signed   By: Greig Pique M.D.   On: 08/07/2024 17:50   DG Chest 2 View Result Date: 08/07/2024 CLINICAL DATA:  Chest pain. EXAM: CHEST - 2 VIEW COMPARISON:  06/05/2024. FINDINGS: The heart size and mediastinal contours are unchanged. Aortic atherosclerosis. Subsegmental atelectasis/scarring again noted in the right middle lobe. No overt edema, acute focal consolidation, pleural effusion, or pneumothorax. Lower cervical ACDF. No acute osseous abnormality. IMPRESSION: No acute cardiopulmonary findings. Electronically Signed   By: Harrietta Sherry M.D.   On: 08/07/2024 11:29   VAS US  CAROTID Result Date: 07/30/2024 Carotid Arterial Duplex Study Patient Name:  LEEON MAKAR  Date of Exam:    07/30/2024 Medical Rec #: [id number]          Accession #:    [id number] Date of Birth: [birth year]          Patient Gender: M Patient Age:   32 years Exam Location:  [address] Procedure:      VAS US  CAROTID Referring Phys: SHENG HALEY --------------------------------------------------------------------------------  Indications:      CAD Native vessel. Risk Factors:     Hypertension, hyperlipidemia, prior MI, coronary artery                   disease. Comparison Study: 05/26/2023 Carotid artery duplex Performing Technologist: Rosaline Fujisawa MHA, RDMS, RVT, RDCS  Examination Guidelines: A complete evaluation includes B-mode imaging, spectral Doppler, color Doppler, and power Doppler as needed of all accessible portions of each vessel. Bilateral testing is considered an integral part of a complete examination. Limited examinations for reoccurring indications may be performed as noted.  Right Carotid Findings: +----------+--------+--------+--------+-----------------------+--------+           PSV cm/sEDV cm/sStenosisPlaque Description     Comments +----------+--------+--------+--------+-----------------------+--------+ CCA Prox  71      10                                              +----------+--------+--------+--------+-----------------------+--------+ CCA Distal65      10                                              +----------+--------+--------+--------+-----------------------+--------+ ICA Prox  137     19      1-39%   smooth and heterogenous         +----------+--------+--------+--------+-----------------------+--------+ ICA Mid   105     16                                              +----------+--------+--------+--------+-----------------------+--------+  ICA Distal62      15                                              +----------+--------+--------+--------+-----------------------+--------+ ECA       154     18                                               +----------+--------+--------+--------+-----------------------+--------+ +----------+--------+-------+----------------+-------------------+           PSV cm/sEDV cmsDescribe        Arm Pressure (mmHG) +----------+--------+-------+----------------+-------------------+ [id number]            Multiphasic, [id number]                 +----------+--------+-------+----------------+-------------------+ +---------+--------+--+--------+-+---------+ VertebralPSV cm/s53EDV cm/s9Antegrade +---------+--------+--+--------+-+---------+  Left Carotid Findings: +----------+--------+--------+--------+-----------------------+--------+           PSV cm/sEDV cm/sStenosisPlaque Description     Comments +----------+--------+--------+--------+-----------------------+--------+ CCA Prox  109     24                                              +----------+--------+--------+--------+-----------------------+--------+ CCA Distal71      14                                              +----------+--------+--------+--------+-----------------------+--------+ ICA Prox  81      16      1-39%   smooth and heterogenous         +----------+--------+--------+--------+-----------------------+--------+ ICA Mid   75      21                                              +----------+--------+--------+--------+-----------------------+--------+ ICA Distal93      19                                              +----------+--------+--------+--------+-----------------------+--------+ ECA       303             >50%    smooth and heterogenous         +----------+--------+--------+--------+-----------------------+--------+ +----------+--------+--------+----------------+-------------------+           PSV cm/sEDV cm/sDescribe        Arm Pressure (mmHG) +----------+--------+--------+----------------+-------------------+ Subclavian171             Multiphasic, [id number]                  +----------+--------+--------+----------------+-------------------+ +---------+--------+--+--------+--+---------+ VertebralPSV cm/s51EDV cm/s13Antegrade +---------+--------+--+--------+--+---------+   Summary: Right Carotid: Velocities in the right ICA are consistent with a 1-39% stenosis.                Unable to replicate elevated proximal ICA velocities from  previous exam. No turbulence noted by color flow Doppler at                proximal ICA. Left Carotid: Velocities in the left ICA are consistent with a 1-39% stenosis. Vertebrals:  Bilateral vertebral arteries demonstrate antegrade flow. Subclavians: Normal flow hemodynamics were seen in bilateral subclavian              arteries. *See table(s) above for measurements and observations. Suggest follow up study in 12 months. Electronically signed by Dorn Lesches MD on 07/30/2024 at 1:16:03 PM.    Final    CT ABDOMEN PELVIS WO CONTRAST Result Date: 07/19/2024 EXAM: CT ABDOMEN AND PELVIS WITHOUT CONTRAST 07/18/2024 11:59:00 PM TECHNIQUE: CT of the abdomen and pelvis was performed without the administration of intravenous contrast. Multiplanar reformatted images are provided for review. Automated exposure control, iterative reconstruction, and/or weight-based adjustment of the mA/kV was utilized to reduce the radiation dose to as low as reasonably achievable. COMPARISON: CT abdomen and pelvis 07/15/2024 CLINICAL HISTORY: Diverticulitis, complication suspected. FINDINGS: LOWER CHEST: No acute abnormality. LIVER: The liver is unremarkable. GALLBLADDER AND BILE DUCTS: Gallbladder is unremarkable. No biliary ductal dilatation. SPLEEN: No acute abnormality. PANCREAS: No acute abnormality. ADRENAL GLANDS: No acute abnormality. KIDNEYS, URETERS AND BLADDER: Left nephrectomy. No stones in the kidneys or ureters. No hydronephrosis. No perinephric or periureteral stranding. Urinary bladder is unremarkable. There are well-defined, low-attenuation  lesions of the kidney suggestive of simple renal cysts. Simple renal cysts do not require additional follow-up unless clinically indicated due to signs or symptoms. GI AND BOWEL: Colonic diverticulosis. Similar mid sigmoid bowel thickening and peri-colonic fat stranding along a focal diverticulum. PO-contrast reaches the rectum. No intramural abscess formation. There is no bowel obstruction. PERITONEUM AND RETROPERITONEUM: No ascites. No free air. No organized fluid collections. VASCULATURE: Aorta is normal in caliber. Moderate atherosclerotic plaque. LYMPH NODES: No lymphadenopathy. REPRODUCTIVE ORGANS: No acute abnormality. BONES AND SOFT TISSUES: Stable implanted stimulator catheter leads bilateral to the superior articular processes of L4. No acute osseous abnormality. Total left hip arthroplasty. No focal soft tissue abnormality. Bilateral fat-containing inguinal hernias. IMPRESSION: 1. Similar mid sigmoid diverticulitis without intramural abscess formation. Recommend colonoscopy status post treatment and status post complete resolution of inflammatory changes to exclude an underlying lesion. 2. Left nephrectomy. Electronically signed by: Morgane Naveau MD 07/19/2024 12:20 AM EDT RP Workstation: [id number]   CT ABDOMEN PELVIS WO CONTRAST Result Date: 07/15/2024 EXAM: CT ABDOMEN AND PELVIS WITHOUT CONTRAST 07/15/2024 06:03:11 PM TECHNIQUE: CT of the abdomen and pelvis was performed without the administration of intravenous contrast. Multiplanar reformatted images are provided for review. Automated exposure control, iterative reconstruction, and/or weight-based adjustment of the mA/kV was utilized to reduce the radiation dose to as low as reasonably achievable. COMPARISON: CT abdomen and pelvis dated 2025. CLINICAL HISTORY: diverticulitis. Pt sent from radiology r/t liquid contrast reaction. FINDINGS: LOWER CHEST: Stable 3 mm left lower lobe nodule. LIVER: The liver is unremarkable. GALLBLADDER AND BILE  DUCTS: Gallbladder is unremarkable. No biliary ductal dilatation. SPLEEN: There are calcified granulomas in the spleen. PANCREAS: No acute abnormality. ADRENAL GLANDS: No acute abnormality. KIDNEYS, URETERS AND BLADDER: Left nephrectomy again seen. There is a cyst in the right kidney measuring 4.2 cm. Additional cyst identified in the right kidney measuring 2.4 cm. Per consensus, no follow-up is needed for simple Bosniak type 1 and 2 renal cysts, unless the patient has a malignancy history or risk factors. No stones in the right kidney or ureters. No hydronephrosis. No perinephric  or periureteral stranding. Urinary bladder is unremarkable. GI AND BOWEL: Stomach demonstrates no acute abnormality. There is sigmoid colon diverticulosis. There is wall thickening and inflammation of the mid sigmoid colon compatible with acute diverticulitis. This is in a similar location when compared to the prior study. No perforation or abscess identified. There are small pericolonic lymph nodes at this level. The appendix is normal in size. There is a large amount of stool throughout the colon. There is no bowel obstruction. PERITONEUM AND RETROPERITONEUM: No ascites. No free air. VASCULATURE: Aorta is normal in caliber with atherosclerotic calcifications. LYMPH NODES: Small pericolonic lymph nodes are present at the level of the mid sigmoid colon. No other significant lymphadenopathy. REPRODUCTIVE ORGANS: Prostate gland is enlarged. BONES AND SOFT TISSUES: Left hip arthroplasty is present. There is a small fat-containing umbilical hernia. There are small fat-containing bilateral inguinal hernias. No acute osseous abnormality. No focal soft tissue abnormality. IMPRESSION: 1. Acute diverticulitis of the mid sigmoid colon in a similar location to prior, without perforation or abscess; small reactive pericolonic lymph nodes. 2. Sigmoid diverticulosis. 3. Right renal simple-appearing cysts ; no follow-up imaging recommended per incidental  renal cyst guidelines. 4. Stable 3 mm solid left lower lobe pulmonary nodule; no routine follow-up imaging recommended as per Fleischner Society Guidelines (in absence of cancer history or immunocompromise). Electronically signed by: Greig Pique MD 07/15/2024 07:03 PM EDT RP Workstation: [id number]   Labs:   Basic Metabolic Panel: Recent Labs  Lab 08/07/24 1035 08/07/24 2206 08/08/24 0844  NA 141  --  139  K 4.2  --  3.9  CL 105  --  106  CO2 22  --  22  GLUCOSE 132*  --  85  BUN 20  --  19  CREATININE 1.13  --  1.01  CALCIUM  9.5  --  9.1  MG  --  2.2 2.3  PHOS  --  3.0 3.3     CBC: Recent Labs  Lab 08/07/24 1035 08/08/24 0844 08/09/24 0444  WBC 11.9* 12.1* 8.3  HGB 13.8 12.9* 12.6*  HCT 43.7 41.4 40.1  MCV 83.1 84.1 83.9  PLT 316 273 270         SIGNED:   True Atlas, MD  [facility] 08/10/2024, 2:11 PM Time taking on discharge: 50 minutes

## 2024-08-10 NOTE — Progress Notes (Signed)
 PT refuses CPAP therapy this admission. Encouraged PT to notify staff if he changes his mind.

## 2024-08-10 NOTE — Progress Notes (Signed)
 Progress Note  Patient Name: Jared [name] Date of Encounter: 08/10/2024 [facility] Cardiologist: Dorn Lesches, MD   Interval Summary    Resting in bed comfortably. No chest pain or heart failure symptoms. Likely being discharged home later today if able to tolerate a diet  Vital Signs Vitals:   08/09/24 0524 08/09/24 1812 08/09/24 2100 08/10/24 0616  BP: (!) 153/82 123/72 121/70 131/70  Pulse: 69 67 67 66  Resp: 15 17 18 18   Temp: (!) 97.4 F (36.3 C) 97.9 F (36.6 C) 98 F (36.7 C) 98 F (36.7 C)  TempSrc: Oral  Oral   SpO2: 96% 98% 97% 97%  Weight:      Height:        Intake/Output Summary (Last 24 hours) at 08/10/2024 1040 Last data filed at 08/10/2024 1006 Gross per 24 hour  Intake 1848.76 ml  Output --  Net 1848.76 ml      08/07/2024   12:55 PM 07/16/2024   12:07 AM 07/15/2024   10:23 AM  Last 3 Weights  Weight (lbs) 197 lb 1.5 oz 197 lb 198 lb 4 oz  Weight (kg) 89.4 kg 89.359 kg 89.926 kg      Telemetry/ECG  Sinus rhythm with rare ventricular runs- Personally Reviewed  CCTA 08/09/2024 1. Coronary calcium  score of 30. This was 19 percentile for age and sex matched control.   2.  Normal coronary origin with right dominance.   3. Stable distal left main calcified plaque of 20-30% when compared to cardiac catheterization in 2011 report.   4. Mild stenosis of proximal LAD noncalcified plaque 30-49%. Does not appear to be flow-limiting   5.  Minimally dilated pulmonary artery 33 mm   6. Previously placed proximal to mid RCA stent appears widely patent.   7. Radiology overread: No significant extracardiac findings within the visualized chest.  Echo 08/08/2024  1. Left ventricular ejection fraction, by estimation, is 60 to 65%. The  left ventricle has normal function. The left ventricle has no regional  wall motion abnormalities. There is mild left ventricular hypertrophy.  Left ventricular diastolic parameters  are consistent  with Grade I diastolic dysfunction (impaired relaxation).   2. Right ventricular systolic function is normal. The right ventricular  size is normal. There is normal pulmonary artery systolic pressure. The  estimated right ventricular systolic pressure is 35.3 mmHg.   3. Left atrial size was mildly dilated.   4. The mitral valve is grossly normal. Mild mitral valve regurgitation.  No evidence of mitral stenosis.   5. The aortic valve is tricuspid. Aortic valve regurgitation is mild to  moderate. No aortic stenosis is present.   6. The inferior vena cava is normal in size with greater than 50%  respiratory variability, suggesting right atrial pressure of 3 mmHg.   Physical Exam  GEN: No acute distress.   Neck: No JVD Cardiac: RRR, no murmurs, rubs, or gallops.  Respiratory: Clear to auscultation bilaterally. GI: Soft, nontender, non-distended  MS: No edema  Assessment & Plan  Mr Fredericks is a 89 yoM with Hx CAD s/p RCA PCI (2000), 30% LM on LHC (2011), PAD s/p RLE balloon angioplasty (03/25) who presented with CP similar to prior MI and found to have possible PNA and acute diverticulitis. Cardiology consulted for pre-op for colectomy. Troponin unremarkable. He had 30% LM lesion back in 2011. CCTA done to evaluate for ischemia as part of chest pain workup and pre-op risk stratification.    #CP #CAD s/p RCA  PCI #PAD s/p RLE balloon angioplasty No anginal chest pain. Echocardiogram and coronary CTA results reviewed with the patient. Restart home medications at the time of discharge. History of statin intolerance, on Repatha  at home Encouraged him to follow-up outpatient with his care team [place]/Dr. Court Plan of care discussed with the patient and nursing staff  For questions or updates, please contact [facility] Please consult [email or web address] for contact info under    Madonna Large, DO, [facility]  A Division of [facility] [address], [place], [place] [address]

## 2024-08-10 NOTE — Plan of Care (Signed)

## 2024-08-10 NOTE — TOC Transition Note (Signed)
 Transition of Care [facility]) - Discharge Note   Patient Details  Name: Jared [name] MRN: [id number] Date of Birth: July 21, 1951  [facility] of [facility]) CM/SW Contact:  Sonda Manuella Quill, RN Phone Number: 08/10/2024, 3:10 PM   Clinical Narrative:    D/C orders received; no IP CM needs.   Final next level of care: Home/Self Care Barriers to Discharge: No Barriers Identified   Patient Goals and CMS Choice Patient states their goals for this hospitalization and ongoing recovery are:: return home          Discharge Placement                       Discharge Plan and Services Additional resources added to the After Visit Summary for                  DME Arranged: N/A DME Agency: NA       HH Arranged: NA HH Agency: NA        Social Drivers of Health (SDOH) Interventions SDOH Screenings   Food Insecurity: No Food Insecurity (08/08/2024)  Housing: Low Risk  (08/08/2024)  Transportation Needs: No Transportation Needs (08/08/2024)  Utilities: Not At Risk (08/08/2024)  Depression (PHQ2-9): Medium Risk (10/28/2020)  Social Connections: Socially Integrated (08/08/2024)  Tobacco Use: Low Risk  (08/07/2024)     Readmission Risk Interventions     No data to display

## 2024-08-12 LAB — LIPOPROTEIN A (LPA): Lipoprotein (a): 12.5 nmol/L (ref <75.0)

## 2024-08-13 ENCOUNTER — Other Ambulatory Visit (HOSPITAL_COMMUNITY): Payer: Self-pay

## 2024-08-13 ENCOUNTER — Ambulatory Visit: Payer: Self-pay | Admitting: Student

## 2024-08-13 DIAGNOSIS — M5412 Radiculopathy, cervical region: Secondary | ICD-10-CM

## 2024-08-21 ENCOUNTER — Ambulatory Visit (HOSPITAL_COMMUNITY): Admission: RE | Admit: 2024-08-21 | Source: Ambulatory Visit

## 2024-08-21 ENCOUNTER — Encounter (HOSPITAL_COMMUNITY): Payer: Self-pay

## 2024-08-22 ENCOUNTER — Ambulatory Visit (HOSPITAL_COMMUNITY): Admission: RE | Admit: 2024-08-22 | Discharge: 2024-08-22

## 2024-08-22 ENCOUNTER — Encounter (HOSPITAL_COMMUNITY): Payer: Self-pay

## 2024-08-22 DIAGNOSIS — M5412 Radiculopathy, cervical region: Secondary | ICD-10-CM

## 2024-08-22 DIAGNOSIS — C44629 Squamous cell carcinoma of skin of left upper limb, including shoulder: Secondary | ICD-10-CM | POA: Diagnosis not present

## 2024-08-26 ENCOUNTER — Ambulatory Visit (HOSPITAL_COMMUNITY)

## 2024-08-27 ENCOUNTER — Other Ambulatory Visit: Payer: Self-pay

## 2024-08-27 DIAGNOSIS — M5412 Radiculopathy, cervical region: Secondary | ICD-10-CM

## 2024-09-03 ENCOUNTER — Ambulatory Visit: Admitting: Cardiovascular Disease

## 2024-09-04 ENCOUNTER — Ambulatory Visit: Admitting: Physician Assistant

## 2024-09-05 ENCOUNTER — Telehealth: Payer: Self-pay

## 2024-09-05 MED ORDER — PREDNISONE 50 MG PO TABS: ORAL_TABLET | ORAL | 0 refills | Status: AC

## 2024-09-05 NOTE — Telephone Encounter (Signed)
 Myelogram (12/19) and 13 hr prep for allergy. Pt verbalizes understandig. Times 2130, 0330, 0930

## 2024-09-05 NOTE — Telephone Encounter (Signed)
 Called to give Myelo prep and 13 hr prep. No answer. Left message for pt to call back asap.

## 2024-09-05 NOTE — Discharge Instructions (Signed)

## 2024-09-06 ENCOUNTER — Ambulatory Visit: Attending: Emergency Medicine | Admitting: Emergency Medicine

## 2024-09-06 ENCOUNTER — Inpatient Hospital Stay: Admission: RE | Admit: 2024-09-06 | Discharge: 2024-09-06

## 2024-09-06 ENCOUNTER — Encounter: Payer: Self-pay | Admitting: Emergency Medicine

## 2024-09-06 VITALS — BP 124/64 | HR 92 | Ht 67.0 in | Wt 197.0 lb

## 2024-09-06 DIAGNOSIS — I739 Peripheral vascular disease, unspecified: Secondary | ICD-10-CM

## 2024-09-06 DIAGNOSIS — I951 Orthostatic hypotension: Secondary | ICD-10-CM

## 2024-09-06 DIAGNOSIS — I779 Disorder of arteries and arterioles, unspecified: Secondary | ICD-10-CM

## 2024-09-06 DIAGNOSIS — M5412 Radiculopathy, cervical region: Secondary | ICD-10-CM

## 2024-09-06 DIAGNOSIS — I472 Ventricular tachycardia, unspecified: Secondary | ICD-10-CM | POA: Diagnosis not present

## 2024-09-06 DIAGNOSIS — I10 Essential (primary) hypertension: Secondary | ICD-10-CM | POA: Diagnosis not present

## 2024-09-06 DIAGNOSIS — I25118 Atherosclerotic heart disease of native coronary artery with other forms of angina pectoris: Secondary | ICD-10-CM

## 2024-09-06 DIAGNOSIS — R55 Syncope and collapse: Secondary | ICD-10-CM

## 2024-09-06 DIAGNOSIS — E785 Hyperlipidemia, unspecified: Secondary | ICD-10-CM

## 2024-09-06 MED ORDER — IOPAMIDOL (ISOVUE-M 200) INJECTION 41%: 10.0000 mL | Freq: Once | INTRAMUSCULAR | Status: DC

## 2024-09-06 MED ORDER — IOPAMIDOL (ISOVUE-M 300) INJECTION 61%
10.0000 mL | Freq: Once | INTRAMUSCULAR | Status: AC
  Administered 2024-09-06: 10 mL via INTRATHECAL

## 2024-09-06 MED ORDER — ONDANSETRON HCL 4 MG/2ML IJ SOLN: 4.0000 mg | Freq: Once | INTRAMUSCULAR | Status: DC | PRN

## 2024-09-06 MED ORDER — MEPERIDINE HCL 50 MG/ML IJ SOLN: 50.0000 mg | Freq: Once | INTRAMUSCULAR | Status: DC | PRN

## 2024-09-06 MED ORDER — DIAZEPAM 5 MG PO TABS
5.0000 mg | ORAL_TABLET | Freq: Once | ORAL | Status: AC
  Administered 2024-09-06: 5 mg via ORAL

## 2024-09-06 NOTE — Patient Instructions (Addendum)
 Medication Instructions:  NO CHANGES  Lab Work: NONE TO BE DONE TODAY.  Testing/Procedures: NONE  Follow-Up: At [facility], you and your health needs are our priority.  As part of our continuing mission to provide you with exceptional heart care, our providers are all part of one team.  This team includes your primary Cardiologist (physician) and Advanced Practice Providers or APPs (Physician Assistants and Nurse Practitioners) who all work together to provide you with the care you need, when you need it.  Your next appointment:   6 MONTHS  Provider:   Dorn Lesches, MD OR Lum Louis, NP

## 2024-09-06 NOTE — Progress Notes (Signed)
 " Cardiology Office Note:    Date:  09/06/2024  ID:  [name], DOB [birth year], MRN [id number] PCP: Shepard Ade, MD  [facility] Providers Cardiologist:  Dorn Lesches, MD Cardiology APP:  Rana Lum CROME, NP       Patient Profile:       Chief Complaint: Hospital follow up History of Present Illness:  Jared [name] is a 73 y.o. male with visit-pertinent history of coronary artery disease s/p PCI to the RCA in 2000, hypertension, hyperlipidemia, T2DM, OSA on CPAP, orthostatic hypotension, diverticulitis, shingles, GERD, CKD stage III P, hypothyroidism, COPD, carotid artery stenosis, PAD s/p right TPT trunk balloon angioplasty in 09/2023   He has history of coronary artery disease with stent to his dominant RCA placed in 2000.  He had mild left main disease with last cardiac cath in 2011 with widely patent stent in the RCA and 30% left main stenosis which was actually improved.  Normal LV function.  Most recent echocardiogram 03/2023 showed LVEF 60 to 65%, no RWMA, grade 1 DD, RV function and size normal, trivial MR, mild aortic valve regurgitation.   He had lower extremity arterial Doppler studies performed 07/03/2023 revealing a right ABI of 0.87 and a left of 1.15. He did have a high-frequency signal in his right posterior tibial artery. He tried cilostazol  for 3 months but it did not improve his symptoms. He had aortobifemoral 08/30/2023 revealing three-vessel runoff bilaterally. They did not mention the posterior tibial lesion.  On 10/19/2023 he underwent successful right TPT trunk balloon angioplasty followed by [facility] for lifestyle-limiting claudication. His ABI improved to normal. He did have a small pseudoaneurysm postprocedure which resolved spontaneously and now has some resolving hematoma. His claudication improved and he was on DAPT.   Seen in clinic on 02/26/2024 by Dr. Lesches for preoperative clearance for left total hip.  He was asymptomatic and cleared without  the need for stress test.  Seen 06/06/2024 for lightheadedness and dizziness for over a month particularly when sitting up, standing, or bending over.  He was positive for orthostatic hypotension in clinic.  His amlodipine  was discontinued.  He was to follow-up in 6 weeks.  On 07/08/2024 he presented to the emergency room after a fall, he had reported episode where he became suddenly lightheaded and collapsed.  Bystanders said that his eyes were rolling back.  CT imaging was benign and troponin was mildly elevated but flat.  Recommended follow-up cardiology outpatient.  Last seen in clinic on 07/11/2024 by Jared [name], [place].  Reported ongoing recurrent issues with syncope always preceded by sensation of feeling lightheaded and dizzy.  His isosorbide  was discontinued.  He underwent carotid artery duplex on 07/2024 that showed 1-39% stenosis in the left and right carotid arteries.  On 10/27 he was seen by his PCP for acute pain of his right shoulder and was diagnosed with shingles.  He presented to the hospital on 08/08/2024 for chest pains.  Troponins were 33, 39, 31, 32.  EKG showed sinus tachycardia and T wave inversions in anterior leads that are present on prior EKGs.  Pulmonary CTA showed no PE, acute diverticulitis and aortic atherosclerosis.  He had an echocardiogram on 08/08/2024 showing LVEF 60-65%, no RWMA, mild LVH, grade 1 DD, RV function and size normal, normal PASP, mild mitral valve regurgitation, mild to moderate aortic valve regurgitation.  Underwent coronary CTA in 08/08/2024 showing coronary calcium  score 30 (19th percentile) with stable distal left main calcified plaque with 20 to 30% with mild  stenosis in the proximal LAD, previously placed proximal to mid RCA stent appears widely patent.  His diverticulitis was managed conservatively with antibiotics.  He also looks like metoprolol  was held during admission and was not restarted at discharge.   Discussed the use of AI scribe software for  clinical note transcription with the patient, who gave verbal consent to proceed.  History of Present Illness Jared [name] is a 73 year old male with coronary artery disease who presents for follow-up after recent hospitalization.   Today patient is doing well without acute cardiovascular concerns or complaints.  He has no chest pain, dyspnea, or dizziness.  He will experience occasional lightheadedness when standing too quickly otherwise no further episodes of syncope.  He denies any palpitations or episodes of fast heart rates.  He denies any claudication or any visual deficits.  Happy with his current health status.  He inconsistently uses his CPAP at home. His wife notes reduced snoring.    Review of systems:  Please see the history of present illness. All other systems are reviewed and otherwise negative.      Studies Reviewed:        Coronary CTA 08/09/2024 1. Coronary calcium  score of 30. This was 19 percentile for age and sex matched control.   2.  Normal coronary origin with right dominance.   3. Stable distal left main calcified plaque of 20-30% when compared to cardiac catheterization in 2011 report.   4. Mild stenosis of proximal LAD noncalcified plaque 30-49%. Does not appear to be flow-limiting   5.  Minimally dilated pulmonary artery 33 mm   6. Previously placed proximal to mid RCA stent appears widely patent.  Echocardiogram 08/08/2024  1. Left ventricular ejection fraction, by estimation, is 60 to 65%. The  left ventricle has normal function. The left ventricle has no regional  wall motion abnormalities. There is mild left ventricular hypertrophy.  Left ventricular diastolic parameters  are consistent with Grade I diastolic dysfunction (impaired relaxation).   2. Right ventricular systolic function is normal. The right ventricular  size is normal. There is normal pulmonary artery systolic pressure. The  estimated right ventricular systolic pressure is  35.3 mmHg.   3. Left atrial size was mildly dilated.   4. The mitral valve is grossly normal. Mild mitral valve regurgitation.  No evidence of mitral stenosis.   5. The aortic valve is tricuspid. Aortic valve regurgitation is mild to  moderate. No aortic stenosis is present.   6. The inferior vena cava is normal in size with greater than 50%  respiratory variability, suggesting right atrial pressure of 3 mmHg.  Carotid duplex 07/30/2024 Right Carotid: Velocities in the right ICA are consistent with a 1-39%  stenosis.                Unable to replicate elevated proximal ICA velocities from                 previous exam. No turbulence noted by color flow Doppler at                 proximal ICA.   Left Carotid: Velocities in the left ICA are consistent with a 1-39%  stenosis.   Peripheral vascular catheterization 10/19/2023 Angiographic Data:    1: Abdominal aorta-widely patent 2: Left lower extremity-normal three-vessel runoff 3: Right lower extremity-90% right tibioperoneal trunk stenosis with three-vessel runoff   IMPRESSION: Mr. Magnussen has a high-grade distal right TPT stenosis responsible for his claudication and  consistent with his Doppler studies.  Will proceed with shockwave balloon angioplasty followed by [facility].   Echocardiogram 04/13/2023 1. Left ventricular ejection fraction, by estimation, is 60 to 65%. The  left ventricle has normal function. The left ventricle has no regional  wall motion abnormalities. Left ventricular diastolic parameters are  consistent with Grade I diastolic  dysfunction (impaired relaxation).   2. Right ventricular systolic function is normal. The right ventricular  size is normal. There is normal pulmonary artery systolic pressure.   3. The mitral valve is normal in structure. Trivial mitral valve  regurgitation. No evidence of mitral stenosis.   4. The aortic valve is tricuspid. Aortic valve regurgitation is mild. No  aortic stenosis is  present.   5. The inferior vena cava is normal in size with greater than 50%  respiratory variability, suggesting right atrial pressure of 3 mmHg.    Carotid duplex 05/26/2023 Right Carotid: Velocities in the right ICA are consistent with a 40-59%                 stenosis.   Left Carotid: Velocities in the left ICA are consistent with a 1-39%  stenosis.               The ECA appears >50% stenosed.   Vertebrals:  Bilateral vertebral arteries demonstrate antegrade flow.  Subclavians: Normal flow hemodynamics were seen in bilateral subclavian               arteries.   *See table(s) above for measurements and observations.  Suggest follow up study in 12 months.    ZIO 02/17/2023 Patch Wear Time:  13 days and 11 hours (2024-06-03T15:05:56-0400 to 2024-06-18T10:35:49-0400)   Monitor 1 Patient had a min HR of 59 bpm, max HR of 113 bpm, and avg HR of 79 bpm. Predominant underlying rhythm was Sinus Rhythm. 1 run of Supraventricular Tachycardia occurred lasting 4 beats with a max rate of 109 bpm (avg 107 bpm). Isolated SVEs were rare  (<1.0%), SVE Triplets were rare (<1.0%), and no SVE Couplets were present. Isolated VEs were occasional (1.6%, 879), VE Couplets were rare (<1.0%, 19), and no VE Triplets were present. Ventricular Trigeminy was present.    Monitor 2 Patient had a min HR of 56 bpm, max HR of 190 bpm, and avg HR of 85 bpm. Predominant underlying rhythm was Sinus Rhythm. 2 Ventricular Tachycardia runs occurred, the run with the fastest interval lasting 19 beats with a max rate of 156 bpm (avg 141 bpm);  the run with the fastest interval was also the longest. 12 Supraventricular Tachycardia runs occurred, the run with the fastest interval lasting 37.2 secs with a max rate of 190 bpm (avg 125 bpm); the run with the fastest interval was also the longest.  Isolated SVEs were rare (<1.0%), SVE Couplets were rare (<1.0%), and SVE Triplets were rare (<1.0%). Isolated VEs were occasional (1.4%,  [id number]), VE Couplets were rare (<1.0%, 392), and VE Triplets were rare (<1.0%, 6). Ventricular Bigeminy was present.  Inverted QRS complexes possibly due to inverted placement of device.   SR/SB/ST Freq PVCs (1.6% burden) Short runs of SVT and longer runs of NSVT ROV to discuss  Risk Assessment/Calculations:              Physical Exam:   VS:  BP 124/64 (BP Location: Left Arm, Patient Position: Sitting, Cuff Size: Normal)   Pulse 92   Ht 5' 7 (1.702 m)   Wt 197 lb (89.4 kg)  BMI 30.85 kg/m    Wt Readings from Last 3 Encounters:  09/06/24 197 lb (89.4 kg)  08/07/24 197 lb 1.5 oz (89.4 kg)  07/16/24 197 lb (89.4 kg)    GEN: Well nourished, well developed in no acute distress NECK: No JVD; No carotid bruits CARDIAC: RRR, no murmurs, rubs, gallops RESPIRATORY:  Clear to auscultation without rales, wheezing or rhonchi  ABDOMEN: Soft, non-tender, non-distended EXTREMITIES:  No edema; No acute deformity      Assessment and Plan:  Orthostatic hypotension Hypertension Telmisartan, amlodipine , isosorbide  previously discontinued due to orthostatic hypotension which is likely exacerbated by his underlying Parkinson's disease - He denies any further episodes of syncope, lightheadedness, or dizziness - Stable.  No changes   Coronary artery disease S/p remote abdominal RCA stenting in 2000.  Last cardiac catheterization in 2011 revealing widely patent stent with 30% left main stenosis which was improved from prior Most recent coronary CTA 07/2024 with CAC of 30 (19th percentile) with stable distal left main, mild stenosis of proximal LAD, previously placed proximal to mid RCA stent widely patent - Today he is stable without anginal symptoms.  Remains active without exertional symptoms no indication for further ischemic evaluation - Continue Plavix  75 mg daily, aspirin  81 mg daily, and Repatha  140 mg q. 14 days   Hyperlipidemia, LDL goal <70 LDL 58 on 07/2024 and well-controlled Past  intolerances to atorvastatin and rosuvastatin - Continue Repatha  140 mg q. 14 days   Carotid stenosis Carotid duplex 07/2024 with bilateral ICAs consistent of 1-39% stenosis - Continue aspirin  and Plavix    Peripheral arterial disease S/p successful right TPT trunk balloon angioplasty followed by [facility] for lifestyle-limiting claudication on 09/2023 Lower extremity Doppler 02/2024 with patent stent and normal ABIs - Managed by Dr. Court - He denies claudication symptoms - Continue aspirin  and Plavix    VT/SVT/syncope Cardiac event monitor 02/2023 showed 19 beat run of VT, 13 beat run of SVT.  Echocardiogram 07/2024 with normal LVEF and no abnormalities - Quiescent.  Today patient is without syncope, presyncope, or palpitations - Can consider restarting metoprolol  if symptoms arise in the future      Dispo:  Return in about 6 months (around 03/07/2025).  Signed, Lum LITTIE Louis, NP  "

## 2024-09-08 NOTE — Progress Notes (Unsigned)
 "     Ellouise Console, PA-C [address] [place], [place]  [address] Phone: [phone]   Primary Care Physician: Shepard Ade, MD  Primary Gastroenterologist:  Ellouise Console, PA-C / Elspeth Naval, MD   Chief Complaint: Follow-up diverticulitis with Abscess   HPI:   Discussed the use of AI scribe software for clinical note transcription with the patient, who gave verbal consent to proceed.  Jared [name] is a 73 year old male with recent recurrent diverticulitis who presents for follow-up after hospitalization and antibiotic treatment.  I last saw patient 07/15/2024 to follow-up with left-sided abdominal pain and recurrent diverticulitis.  He has had multiple persistent episodes of diverticulitis in the sigmoid and descending colon since October 2025.  Failed outpatient treatment (including ciprofloxacin , metronidazole , Keflex , and clindamycin ).  Admitted to hospital 08/07/2024 until 08/10/2024 for inpatient treatment of distal descending colon diverticulitis with possible abscess.  In hospital he was treated with cefepime  and Flagyl .  Discharged home on Antibiotics x 10 days.  Told to follow-up with GI outpatient.    08/07/2024 Last CT chest, abdomen, pelvis with contrast: 1.  No evidence of pulmonary embolism. 2. New scattered pulmonary nodules in the left lower lobe and inferior left upper lobe measuring up to 5 mm. Findings are likely infectious/inflammatory.  3. Findings compatible with acute diverticulitis of the distal descending colon. There is questionable large diverticulum versus wall abscess measuring 3.8 x 2.2 x 3.5 cm. No free air.  History of Present Illness Diverticulitis symptoms and treatment - Hospitalized twice in November for diverticulitis, each admission lasting approximately one week with a one-week interval at home between admissions. - Treated during the second hospitalization with a 10-day course of antibiotics (not penicillin due to  allergy). - Has been off antibiotics for over two weeks. - Complete resolution of diverticulitis symptoms since completing antibiotics. - No abdominal pain, constipation, diarrhea, or blood in the stool. - Bowel movements are regular. - No current fever, chills, nausea, or vomiting. - Lost weight during hospitalizations, attributed to decreased oral intake. - Aware of prior abscess but no current symptoms related to abscess.  08/2022 capsule endoscopy: Normal, negative.   07/2022 EGD by Dr. Naval: Normal.   06/01/2021 screening colonoscopy by Dr. Naval: 6 diminutive adenomatous polyps with diverticulosis.  3-year repeat (due 05/2024).   PMH: CAD with stent, PAD, Parkinson's, sleep apnea on CPAP, GERD, hypothyroidism, GERD, Hx diverticulitis, chronic constipation.  History of IDA.  Hx Left nephrectomy 1997 / Left kidney cancer.  Currently on Plavix  and aspirin .  Last follow-up with cardiology 09/06/2024 and was stable without cardiac symptoms.  Echo 08/08/2024 showed LVEF 60 to 65%.    Current Outpatient Medications  Medication Sig Dispense Refill   albuterol  (PROVENTIL ) (2.5 MG/3ML) 0.083% nebulizer solution Take 3 mLs (2.5 mg total) by nebulization every 6 (six) hours as needed for wheezing or shortness of breath. 75 mL 12   albuterol  (VENTOLIN  HFA) 108 (90 Base) MCG/ACT inhaler Inhale 2 puffs into the lungs every 4 (four) hours as needed for wheezing or shortness of breath. Can inhale two puffs every four to six hours as needed for cough, wheeze, shortness of breath, or chest tightness. 18 g 1   aspirin  EC 81 MG tablet Take 1 tablet (81 mg total) by mouth daily. Swallow whole. 30 tablet 12   carbidopa -levodopa  (SINEMET  IR) 25-100 MG tablet Take 1 tablet by mouth with breakfast, with lunch, and with evening meal.     clopidogrel  (PLAVIX ) 75 MG tablet  TAKE 1 TABLET BY MOUTH DAILY WITH BREAKFAST 90 tablet 3   Coenzyme Q10 (CO Q-10 PO) Take 1 capsule by mouth daily.     DULoxetine   (CYMBALTA ) 60 MG capsule Take 60 mg by mouth every morning.     EPINEPHrine  0.3 mg/0.3 mL IJ SOAJ injection Inject 0.3 mg into the muscle as needed for anaphylaxis.     finasteride  (PROSCAR ) 5 MG tablet Take 5 mg by mouth at bedtime.     gabapentin  (NEURONTIN ) 100 MG capsule Take 100 mg by mouth 3 (three) times daily.     guaiFENesin  (ROBITUSSIN) 100 MG/5ML liquid Take 10 mLs by mouth every 6 (six) hours as needed for cough or to loosen phlegm. 120 mL 0   HYDROcodone -acetaminophen  (NORCO/VICODIN) 5-325 MG tablet Take 1 tablet by mouth every 6 (six) hours as needed for moderate pain (pain score 4-6).     levothyroxine  (SYNTHROID ) 75 MCG tablet Take 75 mcg by mouth daily before breakfast.     montelukast  (SINGULAIR ) 10 MG tablet Take 10 mg by mouth at bedtime. (Patient taking differently: Take 10 mg by mouth as needed.)     Multiple Vitamin (MULTIVITAMIN) tablet Take 1 tablet by mouth daily with breakfast.     Na Sulfate-K Sulfate-Mg Sulfate concentrate (SUPREP) 17.5-3.13-1.6 GM/177ML SOLN Take 1 kit (354 mLs total) by mouth once for 1 dose. 354 mL 0   NUCALA  100 MG/ML SOSY Inject 100 mg into the skin every 30 (thirty) days.     OVER THE COUNTER MEDICATION Place 1 drop into both eyes See admin instructions. Rohto Max Strength Redness Reliever/Redness & Dry Eye Symptom Relief Lubricant Eye Drops - Instill 1 drop into both eyes every 8 hours as needed for dryness     OZEMPIC, 1 MG/DOSE, 4 MG/3ML SOPN Inject 1 mg into the skin every Friday.     pantoprazole  (PROTONIX ) 40 MG tablet TAKE 1 TABLET BY MOUTH DAILY (Patient taking differently: Take 40 mg by mouth at bedtime.) 90 tablet 3   polyethylene glycol (MIRALAX  / GLYCOLAX ) 17 g packet Take 17 g by mouth daily. (Patient taking differently: Take 17 g by mouth as needed for moderate constipation.) 14 each 0   predniSONE  (DELTASONE ) 50 MG tablet Pt to take 50 mg of prednisone  on 12/18/25at 9:30 pm, 50 mg of prednisone  on 09/06/24 at 3:30 am, and 50 mg of  prednisone  on 09/06/24 at 9:30 am. Pt is also to take 50 mg of benadryl  on 09/06/24 at 9:30 am. Please call [phone] with any questions. Please fill RX Today, ASAP. 3 tablet 0   REPATHA  SURECLICK 140 MG/ML SOAJ INJECT 1 PEN INTO THE SKIN EVERY 14 DAYS (Patient taking differently: Inject 140 mg into the skin every 14 (fourteen) days.) 2 mL 11   sodium chloride  (OCEAN) 0.65 % SOLN nasal spray Place 1 spray into both nostrils as needed for congestion.     tamsulosin  (FLOMAX ) 0.4 MG CAPS capsule Take 0.4 mg by mouth at bedtime.     TRELEGY ELLIPTA 100-62.5-25 MCG/ACT AEPB Take 1 puff by mouth daily.     vitamin C  (ASCORBIC ACID ) 500 MG tablet Take 500 mg by mouth 2 (two) times daily.      zinc  gluconate 50 MG tablet Take 50 mg by mouth in the morning and at bedtime.     Current Facility-Administered Medications  Medication Dose Route Frequency Provider Last Rate Last Admin   omalizumab  (XOLAIR ) injection 150 mg  150 mg Subcutaneous Q28 days Ambs, Arlean HERO, FNP  150 mg at 06/20/22 1503   omalizumab  (XOLAIR ) prefilled syringe 150 mg  150 mg Subcutaneous Q28 days Tobie Arleta SQUIBB, MD   150 mg at 08/23/22 1113    Allergies as of 09/09/2024 - Review Complete 09/09/2024  Allergen Reaction Noted   Azithromycin  Anaphylaxis and Other (See Comments) 03/20/2023   Iodinated contrast media Hives 07/15/2024   Nsaids Other (See Comments) 10/12/2011   Penicillins Hives, Itching, Nausea And Vomiting, and Other (See Comments) 10/30/2018   Iohexol  Hives, Itching, and Other (See Comments) 07/15/2024   Codeine Nausea Only and Nausea And Vomiting 10/30/2018   Penicillamine Rash 12/20/2022   Pholcodine Rash 04/04/2022   Statins Other (See Comments) 07/04/2013   Sulfonamide derivatives Hives and Rash 12/20/2022    Past Medical History:  Diagnosis Date   Anxiety    Asthma    Cervical vertebral fusion 05/02/2014   Now presenting with C5-6 radiculopathy   Coronary artery disease    COVID 01/2020   pain all  over monoclonal antibodies given all symptoms resolved   GERD (gastroesophageal reflux disease)    H/O heart artery stent 2000   to right rca   Heart attack (HCC) 2009   History of depression 03/19/2021   History of esophageal stricture    History of spinal fracture 2015   Hyperlipidemia    Hypertension    Hypothyroidism    Neuroleptic induced parkinsonism    left arm tremor   Osteoarthritis    lower back   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   Renal cell carcinoma 1997   Left kidney   Sleep apnea    wears CPAP   Sleep talking    Snoring 12/25/2013   Wears glasses     Past Surgical History:  Procedure Laterality Date   ABDOMINAL AORTOGRAM W/LOWER EXTREMITY N/A 10/19/2023   Procedure: ABDOMINAL AORTOGRAM W/LOWER EXTREMITY;  Surgeon: Court Dorn PARAS, MD;  Location: [facility];  Service: Cardiovascular;  Laterality: N/A;   ANTERIOR CERVICAL DECOMP/DISCECTOMY FUSION N/A 08/27/2014   Procedure: Cervical six-seven anterior cervical decompression fusion with removal of hardware at Cervical five-six;  Surgeon: Fairy Levels, MD;  Location: [facility];  Service: Neurosurgery;  Laterality: N/A;  Cervical six-seven anterior cervical decompression fusion with removal of hardware at Cervical five-six   BACK SURGERY     1992, 2013 lower back   CARDIAC CATHETERIZATION  05/10/2007   Minimal CAD, normal LV systolic function, medical management   CARDIAC CATHETERIZATION  04/08/2008   RCA ulcerated 70-80% stenosis, stented with a 3x74mm Endeavor stent at 13atm for 50sec, reduced from 80% ulcerated stenosis to 0%.   CARDIAC CATHETERIZATION  05/07/2009   50% distal left main disease-IVUS or flow wire too dangerous in particular setting to perform intervention.   CARDIAC CATHETERIZATION  03/18/2010   Medical management   CARDIOVASCULAR STRESS TEST  02/09/2012   Normal, no significant wall abnormalities noted   CARPAL TUNNEL RELEASE Bilateral 2000   COLONOSCOPY      COLONOSCOPY W/ BIOPSIES AND POLYPECTOMY     COLONOSCOPY WITH PROPOFOL   06/01/2021   Elspeth Naval at [facility]   CORONARY STENT PLACEMENT     FEMUR FRACTURE SURGERY Left 1995   HERNIA REPAIR     as infant groin   Left Hip Surgery Left 02/28/2024   NECK SURGERY  2005   NEPHRECTOMY Left 1997   secondary to cancer   PERIPHERAL VASCULAR BALLOON ANGIOPLASTY  10/19/2023   Procedure: PERIPHERAL VASCULAR BALLOON ANGIOPLASTY;  Surgeon: Court Dorn  J, MD;  Location: [facility];  Service: Cardiovascular;;   TOTAL KNEE ARTHROPLASTY Left 2005   TOTAL KNEE ARTHROPLASTY Right 04/13/2018   Procedure: RIGHT TOTAL KNEE ARTHROPLASTY;  Surgeon: Kay Kemps, MD;  Location: [facility] OR;  Service: Orthopedics;  Laterality: Right;   TRANSURETHRAL RESECTION OF PROSTATE N/A 03/25/2021   Procedure: TRANSURETHRAL RESECTION OF THE PROSTATE (TURP);  Surgeon: Matilda Senior, MD;  Location: [facility];  Service: Urology;  Laterality: N/A;    Review of Systems:    All systems reviewed and negative except where noted in HPI.    Physical Exam:  BP (!) 146/74 (BP Location: Left Arm, Patient Position: Sitting)   Pulse 86   Ht 5' 7 (1.702 m)   Wt 201 lb 4 oz (91.3 kg)   BMI 31.52 kg/m  No LMP for male patient.  General: Well-nourished, well-developed in no acute distress.  Lungs: Clear to auscultation bilaterally. Non-labored. Heart: Regular rate and rhythm, no murmurs rubs or gallops.  Abdomen: Bowel sounds are normal; Abdomen is Soft; No hepatosplenomegaly, masses or hernias; mild LLQ abdominal Tenderness; rest of abdomen is nontender.  No guarding or rebound tenderness. Neuro: Alert and oriented x 3.  Grossly intact.  Psych: Alert and cooperative, normal mood and affect.   Imaging Studies: DG MYELOGRAPHY LUMBAR INJ CERVICAL Result Date: 09/06/2024 CLINICAL DATA:  73 year old male with history of cervical spondylosis, degenerative disc disease and radiculopathy. FLUOROSCOPY:  Radiation Exposure Index (as provided by the fluoroscopic device): 14.2 mGy Kerma PROCEDURE: LUMBAR MYELOGRAM After thorough discussion of risks and benefits of the procedure including bleeding, infection, injury to nerves, blood vessels, adjacent structures as well as headache and CSF leak, written and oral informed consent was obtained. Consent was obtained by Rayfield Buff, NP. Time out form was completed. Patient was positioned prone on the fluoroscopy table. Local anesthesia was provided with 1% lidocaine  without epinephrine  after prepped and draped in the usual sterile fashion. Puncture was performed at L4-L5 using a 3 1/2 inch 22-gauge spinal needle via right paramedian) approach. Using a single pass through the dura, the needle was placed within the thecal sac, with return of clear CSF. 10 mL of Isovue  M-300 was injected into the thecal sac, with normal opacification of the nerve roots and cauda equina consistent with free flow within the subarachnoid space. Kristi Davenport, NP performed the lumbar puncture and administered the intrathecal contrast. Dr. Wilkie Lent supervised acquisition of the myelogram images. COMPARISON:  CT abdomen pelvis 08/07/2024 FINDINGS: Surgical changes of prior C7-T1 anterior cervical discectomy and fusion. No evidence of hardware complication. IMPRESSION: 1. Successful L3-L4 lumbar puncture with instillation of intrathecal contrast for CT myelography. Please see separately dictated CT myelogram for full detail. 2. Surgical changes of C7-T1 anterior cervical discectomy without evidence of complication. Electronically Signed   By: Wilkie Lent M.D.   On: 09/06/2024 12:10    Labs: CBC    Component Value Date/Time   WBC 12.7 (H) 09/09/2024 1302   RBC 4.96 09/09/2024 1302   HGB 12.8 (L) 09/09/2024 1302   HGB 13.8 10/03/2023 1645   HCT 40.2 09/09/2024 1302   HCT 44.7 10/03/2023 1645   PLT 397.0 09/09/2024 1302   PLT 393 10/03/2023 1645   MCV 81.2 09/09/2024  1302   MCV 83 10/03/2023 1645   MCH 26.4 08/09/2024 0444   MCHC 31.7 09/09/2024 1302   RDW 18.4 (H) 09/09/2024 1302   RDW 14.6 10/03/2023 1645   LYMPHSABS 1.9 09/09/2024 1302  LYMPHSABS 2.3 10/03/2023 1645   MONOABS 1.4 (H) 09/09/2024 1302   EOSABS 0.1 09/09/2024 1302   EOSABS 0.3 10/03/2023 1645   BASOSABS 0.1 09/09/2024 1302   BASOSABS 0.1 10/03/2023 1645    CMP     Component Value Date/Time   NA 141 09/09/2024 1302   NA 140 10/03/2023 1645   K 4.7 09/09/2024 1302   CL 104 09/09/2024 1302   CO2 28 09/09/2024 1302   GLUCOSE 88 09/09/2024 1302   BUN 28 (H) 09/09/2024 1302   BUN 23 10/03/2023 1645   CREATININE 1.07 09/09/2024 1302   CREATININE 1.41 (H) 06/25/2013 1051   CALCIUM  9.5 09/09/2024 1302   PROT 7.6 08/08/2024 0844   PROT 7.5 07/11/2023 0903   ALBUMIN 4.0 08/08/2024 0844   ALBUMIN 4.2 07/11/2023 0903   AST 20 08/08/2024 0844   ALT 10 08/08/2024 0844   ALKPHOS 78 08/08/2024 0844   BILITOT 0.5 08/08/2024 0844   BILITOT 0.3 07/11/2023 0903   GFRNONAA >60 08/08/2024 0844   GFRNONAA 59.0 01/01/2024 1238   GFRAA 56 (L) 10/28/2020 1127     Assessment and Plan:   Jared [name] is a 73 y.o. y/o male presents for follow-up of: Assessment & Plan 1.  Recurrent complicated diverticulitis of the sigmoid colon with abscess: Required hospitalizations.  Failed outpatient antibiotics.  Symptoms have currently improved s/p inpatient treatment with antibiotics.  08/07/2024 CT showed persistent sigmoid diverticulitis with uestionable large diverticulum versus wall abscess measuring 3.8 x 2.2 x 3.5 cm.  He has completed all antibiotics several weeks ago.  No current abdominal pain, however there was mild LLQ tenderness on exam today.  I am ordering repeat CT scan to ensure resolution of sigmoid diverticulitis and abscess. - Lab: BMP, CBC - Ordered repeat abdominal/pelvic CT to confirm resolution of abscess prior to colonoscopy.  2.  History of adenomatous colon polyps:   Due for 3-year repeat surveillance colonoscopy. - Scheduled colonoscopy for late February, at least six weeks after infection resolution, contingent on CT findings. - Advised him to report any recurrent abdominal pain prior to colonoscopy, as active infection would contraindicate the procedure.  3.  Comorbidities:  CAD with stent, PAD, Parkinson's, sleep apnea on CPAP, GERD, hypothyroidism, GERD, Hx diverticulitis, chronic constipation.  History of IDA.  Hx Left nephrectomy 1997 / Left kidney cancer.  Currently on Plavix  and aspirin .  Last follow-up with cardiology 09/06/2024 and was stable without cardiac symptoms.  Echo 08/08/2024 showed LVEF 60 to 65%. - Requesting cardiac permission to hold Plavix  5 days prior to colonoscopy.  Ellouise Console, PA-C  Follow up based on CT results, colonoscopy results, and GI symptoms.   "

## 2024-09-09 ENCOUNTER — Other Ambulatory Visit (INDEPENDENT_AMBULATORY_CARE_PROVIDER_SITE_OTHER)

## 2024-09-09 ENCOUNTER — Encounter: Payer: Self-pay | Admitting: Physician Assistant

## 2024-09-09 ENCOUNTER — Telehealth: Payer: Self-pay

## 2024-09-09 ENCOUNTER — Ambulatory Visit: Admitting: Physician Assistant

## 2024-09-09 VITALS — BP 146/74 | HR 86 | Ht 67.0 in | Wt 201.2 lb

## 2024-09-09 DIAGNOSIS — I739 Peripheral vascular disease, unspecified: Secondary | ICD-10-CM

## 2024-09-09 DIAGNOSIS — Z860100 Personal history of colon polyps, unspecified: Secondary | ICD-10-CM

## 2024-09-09 DIAGNOSIS — K5720 Diverticulitis of large intestine with perforation and abscess without bleeding: Secondary | ICD-10-CM | POA: Diagnosis not present

## 2024-09-09 LAB — CBC WITH DIFFERENTIAL/PLATELET
Basophils Absolute: 0.1 K/uL (ref 0.0–0.1)
Basophils Relative: 0.4 % (ref 0.0–3.0)
Eosinophils Absolute: 0.1 K/uL (ref 0.0–0.7)
Eosinophils Relative: 1.2 % (ref 0.0–5.0)
HCT: 40.2 % (ref 39.0–52.0)
Hemoglobin: 12.8 g/dL — ABNORMAL LOW (ref 13.0–17.0)
Lymphocytes Relative: 15.1 % (ref 12.0–46.0)
Lymphs Abs: 1.9 K/uL (ref 0.7–4.0)
MCHC: 31.7 g/dL (ref 30.0–36.0)
MCV: 81.2 fl (ref 78.0–100.0)
Monocytes Absolute: 1.4 K/uL — ABNORMAL HIGH (ref 0.1–1.0)
Monocytes Relative: 11.3 % (ref 3.0–12.0)
Neutro Abs: 9.1 K/uL — ABNORMAL HIGH (ref 1.4–7.7)
Neutrophils Relative %: 72 % (ref 43.0–77.0)
Platelets: 397 K/uL (ref 150.0–400.0)
RBC: 4.96 Mil/uL (ref 4.22–5.81)
RDW: 18.4 % — ABNORMAL HIGH (ref 11.5–15.5)
WBC: 12.7 K/uL — ABNORMAL HIGH (ref 4.0–10.5)

## 2024-09-09 LAB — BASIC METABOLIC PANEL WITH GFR
BUN: 28 mg/dL — ABNORMAL HIGH (ref 6–23)
CO2: 28 meq/L (ref 19–32)
Calcium: 9.5 mg/dL (ref 8.4–10.5)
Chloride: 104 meq/L (ref 96–112)
Creatinine, Ser: 1.07 mg/dL (ref 0.40–1.50)
GFR: 67.72 mL/min
Glucose, Bld: 88 mg/dL (ref 70–99)
Potassium: 4.7 meq/L (ref 3.5–5.1)
Sodium: 141 meq/L (ref 135–145)

## 2024-09-09 MED ORDER — NA SULFATE-K SULFATE-MG SULF 17.5-3.13-1.6 GM/177ML PO SOLN: 1.0000 | Freq: Once | ORAL | 0 refills | Status: AC

## 2024-09-09 NOTE — Telephone Encounter (Signed)
 [facility] Pre-operative Risk Assessment     Request for surgical clearance:     Endoscopy Procedure  What type of surgery is being performed?     Colonoscopy  When is this surgery scheduled?     11/07/24  What type of clearance is required ?   Pharmacy  Are there any medications that need to be held prior to surgery and how long? Plavix  5 days  Practice name and name of physician performing surgery?      [facility]  What is your office phone and fax number?      Phone- [phone]  Fax- [phone]  Anesthesia type (None, local, MAC, general) ?       MAC   Please route your response to Alethea Blocker, CMA

## 2024-09-09 NOTE — Telephone Encounter (Signed)
"  ° °  Patient Name: Jared [name]  DOB: [birth year] MRN: [id number]  Primary Cardiologist: Dorn Lesches, MD  I have reviewed the patient's past medical history, labs, and current medications as part of preoperative protocol coverage. The following recommendations have been made:  Per office protocol, if patient is without any new symptoms or concerns, he/she may hold Plavix  for 5 days prior to procedure. Please resume Plavix  as soon as possible postprocedure, at the discretion of the surgeon. We recommend patient take Aspirin  81 mg daily throughout the perioperative period.  Please discontinue Aspirin  upon resumption of Plavix .  I will route this recommendation to the requesting party via Epic fax function and remove from pre-op pool.  Please call with questions.  Keimani Laufer E Karesa Maultsby, NP 09/09/2024, 1:41 PM  "

## 2024-09-09 NOTE — Patient Instructions (Signed)
 Your provider has requested that you go to the basement level for lab work before leaving today. Press B on the elevator. The lab is located at the first door on the left as you exit the elevator.  You have been scheduled for a CT scan of the abdomen and pelvis at [facility], 1st floor Radiology. You are scheduled on 09/18/24 at 1:00 pm. You should arrive 15 minutes prior to your appointment time for registration. You may take any medications as prescribed with a small amount of water , if necessary. If you take any of the following medications: METFORMIN, GLUCOPHAGE, GLUCOVANCE, AVANDAMET, RIOMET, FORTAMET, ACTOPLUS MET, JANUMET, GLUMETZA or METAGLIP, you MAY be asked to HOLD this medication 48 hours AFTER the exam. The purpose of you drinking the oral contrast is to aid in the visualization of your intestinal tract. The contrast solution may cause some diarrhea. Depending on your individual set of symptoms, you may also receive an intravenous injection of x-ray contrast/dye. Plan on being at [facility] for 45 minutes or longer, depending on the type of exam you are having performed. If you have any questions regarding your exam or if you need to reschedule, you may call [facility] at [phone] between the hours of 8:00 am and 5:00 pm, Monday-Friday.   You have been scheduled for a Colonoscopy. Please follow written instructions given to you at your visit today.   If you use inhalers (even only as needed), please bring them with you on the day of your procedure.  DO NOT TAKE 7 DAYS PRIOR TO TEST- Trulicity (dulaglutide) Ozempic, Wegovy (semaglutide) Mounjaro (tirzepatide) Bydureon Bcise (exanatide extended release)  DO NOT TAKE 1 DAY PRIOR TO YOUR TEST Rybelsus (semaglutide) Adlyxin (lixisenatide) Victoza (liraglutide) Byetta (exanatide) ___________________________________________________________________________  Please follow up sooner if symptoms increase or worsen    Due to recent changes in healthcare laws, you may see the results of your imaging and laboratory studies on MyChart before your provider has had a chance to review them.  We understand that in some cases there may be results that are confusing or concerning to you. Not all laboratory results come back in the same time frame and the provider may be waiting for multiple results in order to interpret others.  Please give us  48 hours in order for your provider to thoroughly review all the results before contacting the office for clarification of your results.   Thank you for trusting me with your gastrointestinal care!   Ellouise Console, PA-C _______________________________________________________  If your blood pressure at your visit was 140/90 or greater, please contact your primary care physician to follow up on this.  _______________________________________________________  If you are age 75 or older, your body mass index should be between 23-30. Your Body mass index is 31.52 kg/m. If this is out of the aforementioned range listed, please consider follow up with your Primary Care Provider.  If you are age 75 or younger, your body mass index should be between 19-25. Your Body mass index is 31.52 kg/m. If this is out of the aformentioned range listed, please consider follow up with your Primary Care Provider.   ________________________________________________________  The [place] [facility] providers would like to encourage you to use MYCHART to communicate with providers for non-urgent requests or questions.  Due to long hold times on the telephone, sending your provider a message by [facility] may be a faster and more efficient way to get a response.  Please allow 48 business hours for a  response.  Please remember that this is for non-urgent requests.  _______________________________________________________

## 2024-09-10 ENCOUNTER — Ambulatory Visit: Payer: Self-pay | Admitting: Physician Assistant

## 2024-09-10 NOTE — Progress Notes (Signed)
 Agree with assessment and plan as outlined. If he has any recurrent symptoms in the interim would have low threshold to add back antibiotics. He was evaluated by surgery during inpatient stay and they wanted to hold off on surgery as inpatient. Will see how he does with supportive measures for now, hopefully stable so we can proceed with colonoscopy in the next 6-8 weeks

## 2024-09-18 ENCOUNTER — Ambulatory Visit (HOSPITAL_COMMUNITY): Admission: RE | Admit: 2024-09-18 | Source: Ambulatory Visit

## 2024-09-30 ENCOUNTER — Other Ambulatory Visit: Payer: Self-pay | Admitting: Cardiovascular Disease

## 2024-10-01 ENCOUNTER — Telehealth (HOSPITAL_BASED_OUTPATIENT_CLINIC_OR_DEPARTMENT_OTHER): Payer: Self-pay | Admitting: *Deleted

## 2024-10-01 MED ORDER — PANTOPRAZOLE SODIUM 40 MG PO TBEC: 40.0000 mg | DELAYED_RELEASE_TABLET | Freq: Every day | ORAL | 1 refills | Status: AC

## 2024-10-01 NOTE — Telephone Encounter (Signed)
"  ° °  Primary Cardiologist: Dorn Lesches, MD  Chart reviewed as part of pre-operative protocol coverage. Given past medical history and time since last visit, based on ACC/AHA guidelines, EMORI KAMAU would be at acceptable risk for the planned procedure without further cardiovascular testing.   Patient should contact our office if he is having new symptoms that are concerning from a cardiac perspective to arrange a follow-up appointment.    Per office protocol, he may hold Plavix  for 7 days prior to procedure and should resume as soon as hemodynamically stable postoperatively.  I will route this recommendation to the requesting party via Epic fax function and remove from pre-op pool.  Please call with questions.  Rosaline EMERSON Bane, NP-C 10/01/2024, 12:23 PM [address] [place], [place] [address] Office [phone] Fax [phone]    "

## 2024-10-01 NOTE — Telephone Encounter (Signed)
"  ° °  Pre-operative Risk Assessment    Patient Name: Jared [name]  DOB: 22-May-1951 MRN: [id number]   Date of last office visit: 09/06/2024 Date of next office visit: None  Request for Surgical Clearance    Procedure:  C7-T1 ESI  Date of Surgery:  Clearance TBD                                 Surgeon:  Dr. Darlis Socks Group or Practice Name:  [facility] Phone number:  [phone] Fax number:  [phone]   Type of Clearance Requested:   - Medical  - Pharmacy:  Hold Clopidogrel  (Plavix ) 7 days prior and resume next day.   Type of Anesthesia:  Not Indicated   Additional requests/questions:    Signed, Edsel Grayce Sanders   10/01/2024, 12:04 PM   "

## 2024-10-24 ENCOUNTER — Telehealth: Payer: Self-pay

## 2024-10-24 ENCOUNTER — Ambulatory Visit

## 2024-10-24 ENCOUNTER — Encounter: Payer: Self-pay | Admitting: Gastroenterology

## 2024-10-24 ENCOUNTER — Encounter

## 2024-10-24 VITALS — Ht 67.0 in | Wt 200.0 lb

## 2024-10-24 DIAGNOSIS — Z860100 Personal history of colon polyps, unspecified: Secondary | ICD-10-CM

## 2024-10-24 MED ORDER — NA SULFATE-K SULFATE-MG SULF 17.5-3.13-1.6 GM/177ML PO SOLN: 1.0000 | Freq: Once | ORAL | 0 refills | Status: AC

## 2024-10-24 NOTE — Telephone Encounter (Signed)
 RN called patient. Patient not aware it was in person for his PV.  RN rescheduled patient for 10/24/24 at 3:30PM, via phone call per patient request.  RN reiterated she will call him today at 3:30PM, patient verbalizes understanding.

## 2024-10-24 NOTE — Telephone Encounter (Signed)
 Good afternoon Dr. Leigh,   I noticed when doing his pre-visit today, he had a left nephrectomy.  Is he okay with taking Suprep for his colonoscopy on 11/07/2024 wit you. His last colonoscopy was in 2022 with SuTab . Please advise.  Thank you!

## 2024-10-24 NOTE — Progress Notes (Signed)
 No egg or soy allergy known to patient  No issues known to pt with past sedation with any surgeries or procedures Patient denies ever being told they had issues or difficulty with intubation  No FH of Malignant Hyperthermia Pt is not on diet pills Pt is not on  home 02  Pt is not on blood thinners- PLAVIX   Pt denies issues with constipation  No A fib or A flutter Have any cardiac testing pending--NO Pt can ambulate-INDEPENDENTLY Pt denies use of chewing tobacco Discussed diabetic I weight loss medication holds-YES-OZEMPIC Discussed NSAID holds Checked BMI Pt instructed to use [employer] or GoodRx for a price reduction on prep  Patient's chart reviewed by Norleen Schillings CNRA prior to previsit and patient appropriate for the [facility].  Pre visit completed and red dot placed by patient's name on their procedure day (on provider's schedule).

## 2024-10-24 NOTE — Telephone Encounter (Signed)
 RN attempted to call patient, NO answer. LVM informing patient he had a in person PV appt. [facility] number given to call back. RN will attempt to call patient in 10 minutes.

## 2024-11-07 ENCOUNTER — Encounter: Admitting: Gastroenterology
